# Patient Record
Sex: Female | Born: 1950 | Race: White | Hispanic: No | Marital: Single | Smoking: Former smoker
Health system: Southern US, Community
[De-identification: ages and names within clinical notes are randomized; demographics above are authoritative.]

## PROBLEM LIST (undated history)

## (undated) DIAGNOSIS — M1990 Unspecified osteoarthritis, unspecified site: Secondary | ICD-10-CM

## (undated) DIAGNOSIS — D126 Benign neoplasm of colon, unspecified: Secondary | ICD-10-CM

## (undated) DIAGNOSIS — E119 Type 2 diabetes mellitus without complications: Secondary | ICD-10-CM

## (undated) DIAGNOSIS — I739 Peripheral vascular disease, unspecified: Secondary | ICD-10-CM

## (undated) DIAGNOSIS — C801 Malignant (primary) neoplasm, unspecified: Secondary | ICD-10-CM

## (undated) DIAGNOSIS — J449 Chronic obstructive pulmonary disease, unspecified: Secondary | ICD-10-CM

## (undated) DIAGNOSIS — I10 Essential (primary) hypertension: Secondary | ICD-10-CM

## (undated) DIAGNOSIS — F329 Major depressive disorder, single episode, unspecified: Secondary | ICD-10-CM

## (undated) DIAGNOSIS — F32A Depression, unspecified: Secondary | ICD-10-CM

## (undated) DIAGNOSIS — J45909 Unspecified asthma, uncomplicated: Secondary | ICD-10-CM

## (undated) DIAGNOSIS — I350 Nonrheumatic aortic (valve) stenosis: Secondary | ICD-10-CM

## (undated) DIAGNOSIS — R1319 Other dysphagia: Secondary | ICD-10-CM

## (undated) DIAGNOSIS — K219 Gastro-esophageal reflux disease without esophagitis: Secondary | ICD-10-CM

## (undated) DIAGNOSIS — E669 Obesity, unspecified: Secondary | ICD-10-CM

## (undated) DIAGNOSIS — E785 Hyperlipidemia, unspecified: Secondary | ICD-10-CM

## (undated) HISTORY — PX: FRACTURE SURGERY: SHX138

## (undated) HISTORY — PX: BLADDER SURGERY: SHX569

## (undated) HISTORY — PX: FOOT SURGERY: SHX648

## (undated) MED FILL — Fosaprepitant Dimeglumine For IV Infusion 150 MG (Base Eq): INTRAVENOUS | Qty: 5 | Status: AC

---

## 2011-06-06 DIAGNOSIS — E118 Type 2 diabetes mellitus with unspecified complications: Secondary | ICD-10-CM | POA: Insufficient documentation

## 2011-06-06 DIAGNOSIS — E1169 Type 2 diabetes mellitus with other specified complication: Secondary | ICD-10-CM | POA: Insufficient documentation

## 2011-06-06 DIAGNOSIS — E119 Type 2 diabetes mellitus without complications: Secondary | ICD-10-CM | POA: Insufficient documentation

## 2011-06-06 DIAGNOSIS — I152 Hypertension secondary to endocrine disorders: Secondary | ICD-10-CM | POA: Insufficient documentation

## 2011-06-06 DIAGNOSIS — F329 Major depressive disorder, single episode, unspecified: Secondary | ICD-10-CM | POA: Insufficient documentation

## 2011-06-06 DIAGNOSIS — F39 Unspecified mood [affective] disorder: Secondary | ICD-10-CM | POA: Insufficient documentation

## 2012-03-12 DIAGNOSIS — Z720 Tobacco use: Secondary | ICD-10-CM | POA: Insufficient documentation

## 2012-03-12 DIAGNOSIS — I739 Peripheral vascular disease, unspecified: Secondary | ICD-10-CM | POA: Insufficient documentation

## 2012-03-12 DIAGNOSIS — I70219 Atherosclerosis of native arteries of extremities with intermittent claudication, unspecified extremity: Secondary | ICD-10-CM | POA: Insufficient documentation

## 2012-07-31 DIAGNOSIS — J4489 Other specified chronic obstructive pulmonary disease: Secondary | ICD-10-CM | POA: Insufficient documentation

## 2012-07-31 DIAGNOSIS — J449 Chronic obstructive pulmonary disease, unspecified: Secondary | ICD-10-CM | POA: Insufficient documentation

## 2013-08-19 DIAGNOSIS — IMO0001 Reserved for inherently not codable concepts without codable children: Secondary | ICD-10-CM | POA: Insufficient documentation

## 2015-04-05 DIAGNOSIS — Z794 Long term (current) use of insulin: Secondary | ICD-10-CM | POA: Insufficient documentation

## 2015-04-05 DIAGNOSIS — M159 Polyosteoarthritis, unspecified: Secondary | ICD-10-CM | POA: Insufficient documentation

## 2015-04-05 DIAGNOSIS — I10 Essential (primary) hypertension: Secondary | ICD-10-CM | POA: Insufficient documentation

## 2015-04-05 DIAGNOSIS — E782 Mixed hyperlipidemia: Secondary | ICD-10-CM | POA: Insufficient documentation

## 2015-04-05 DIAGNOSIS — E118 Type 2 diabetes mellitus with unspecified complications: Secondary | ICD-10-CM | POA: Insufficient documentation

## 2015-04-05 DIAGNOSIS — E669 Obesity, unspecified: Secondary | ICD-10-CM | POA: Insufficient documentation

## 2015-05-04 DIAGNOSIS — F322 Major depressive disorder, single episode, severe without psychotic features: Secondary | ICD-10-CM | POA: Diagnosis not present

## 2015-05-12 DIAGNOSIS — Z1211 Encounter for screening for malignant neoplasm of colon: Secondary | ICD-10-CM | POA: Diagnosis not present

## 2015-05-18 DIAGNOSIS — F322 Major depressive disorder, single episode, severe without psychotic features: Secondary | ICD-10-CM | POA: Diagnosis not present

## 2015-05-31 DIAGNOSIS — F322 Major depressive disorder, single episode, severe without psychotic features: Secondary | ICD-10-CM | POA: Diagnosis not present

## 2015-06-06 DIAGNOSIS — F322 Major depressive disorder, single episode, severe without psychotic features: Secondary | ICD-10-CM | POA: Diagnosis not present

## 2015-06-10 DIAGNOSIS — F322 Major depressive disorder, single episode, severe without psychotic features: Secondary | ICD-10-CM | POA: Diagnosis not present

## 2015-06-11 DIAGNOSIS — F322 Major depressive disorder, single episode, severe without psychotic features: Secondary | ICD-10-CM | POA: Diagnosis not present

## 2015-06-15 DIAGNOSIS — F322 Major depressive disorder, single episode, severe without psychotic features: Secondary | ICD-10-CM | POA: Diagnosis not present

## 2015-06-20 DIAGNOSIS — F322 Major depressive disorder, single episode, severe without psychotic features: Secondary | ICD-10-CM | POA: Diagnosis not present

## 2015-06-27 DIAGNOSIS — H5213 Myopia, bilateral: Secondary | ICD-10-CM | POA: Diagnosis not present

## 2015-06-27 DIAGNOSIS — E119 Type 2 diabetes mellitus without complications: Secondary | ICD-10-CM | POA: Diagnosis not present

## 2015-06-27 DIAGNOSIS — H524 Presbyopia: Secondary | ICD-10-CM | POA: Diagnosis not present

## 2015-07-04 DIAGNOSIS — F322 Major depressive disorder, single episode, severe without psychotic features: Secondary | ICD-10-CM | POA: Diagnosis not present

## 2015-07-06 DIAGNOSIS — J45909 Unspecified asthma, uncomplicated: Secondary | ICD-10-CM | POA: Diagnosis not present

## 2015-07-06 DIAGNOSIS — J449 Chronic obstructive pulmonary disease, unspecified: Secondary | ICD-10-CM | POA: Diagnosis not present

## 2015-07-06 DIAGNOSIS — F3341 Major depressive disorder, recurrent, in partial remission: Secondary | ICD-10-CM | POA: Diagnosis not present

## 2015-07-06 DIAGNOSIS — Z794 Long term (current) use of insulin: Secondary | ICD-10-CM | POA: Diagnosis not present

## 2015-07-06 DIAGNOSIS — I739 Peripheral vascular disease, unspecified: Secondary | ICD-10-CM | POA: Diagnosis not present

## 2015-07-06 DIAGNOSIS — I10 Essential (primary) hypertension: Secondary | ICD-10-CM | POA: Diagnosis not present

## 2015-07-06 DIAGNOSIS — E782 Mixed hyperlipidemia: Secondary | ICD-10-CM | POA: Diagnosis not present

## 2015-07-06 DIAGNOSIS — E118 Type 2 diabetes mellitus with unspecified complications: Secondary | ICD-10-CM | POA: Diagnosis not present

## 2015-07-18 DIAGNOSIS — F322 Major depressive disorder, single episode, severe without psychotic features: Secondary | ICD-10-CM | POA: Diagnosis not present

## 2015-08-01 DIAGNOSIS — F322 Major depressive disorder, single episode, severe without psychotic features: Secondary | ICD-10-CM | POA: Diagnosis not present

## 2015-08-04 ENCOUNTER — Encounter: Payer: Self-pay | Admitting: *Deleted

## 2015-08-05 ENCOUNTER — Inpatient Hospital Stay
Admission: AD | Admit: 2015-08-05 | Discharge: 2015-08-06 | DRG: 189 | Disposition: A | Discharge status: Home / Self Care | Payer: PPO | Source: Ambulatory Visit | Attending: Internal Medicine | Admitting: Internal Medicine

## 2015-08-05 ENCOUNTER — Encounter: Payer: Self-pay | Admitting: *Deleted

## 2015-08-05 ENCOUNTER — Ambulatory Visit: Payer: PPO

## 2015-08-05 ENCOUNTER — Ambulatory Visit: Payer: PPO | Admitting: Anesthesiology

## 2015-08-05 ENCOUNTER — Encounter
Admission: AD | Disposition: A | Discharge status: Home / Self Care | Payer: Self-pay | Source: Ambulatory Visit | Attending: Internal Medicine

## 2015-08-05 DIAGNOSIS — E785 Hyperlipidemia, unspecified: Secondary | ICD-10-CM | POA: Diagnosis present

## 2015-08-05 DIAGNOSIS — D123 Benign neoplasm of transverse colon: Secondary | ICD-10-CM | POA: Diagnosis not present

## 2015-08-05 DIAGNOSIS — R0602 Shortness of breath: Secondary | ICD-10-CM

## 2015-08-05 DIAGNOSIS — Z79899 Other long term (current) drug therapy: Secondary | ICD-10-CM | POA: Diagnosis not present

## 2015-08-05 DIAGNOSIS — Z8249 Family history of ischemic heart disease and other diseases of the circulatory system: Secondary | ICD-10-CM | POA: Diagnosis not present

## 2015-08-05 DIAGNOSIS — Z794 Long term (current) use of insulin: Secondary | ICD-10-CM | POA: Diagnosis not present

## 2015-08-05 DIAGNOSIS — Z823 Family history of stroke: Secondary | ICD-10-CM | POA: Diagnosis not present

## 2015-08-05 DIAGNOSIS — Z6841 Body Mass Index (BMI) 40.0 and over, adult: Secondary | ICD-10-CM

## 2015-08-05 DIAGNOSIS — E119 Type 2 diabetes mellitus without complications: Secondary | ICD-10-CM | POA: Diagnosis not present

## 2015-08-05 DIAGNOSIS — Z7982 Long term (current) use of aspirin: Secondary | ICD-10-CM

## 2015-08-05 DIAGNOSIS — K645 Perianal venous thrombosis: Secondary | ICD-10-CM | POA: Diagnosis not present

## 2015-08-05 DIAGNOSIS — Z87891 Personal history of nicotine dependence: Secondary | ICD-10-CM | POA: Diagnosis not present

## 2015-08-05 DIAGNOSIS — I10 Essential (primary) hypertension: Secondary | ICD-10-CM | POA: Diagnosis present

## 2015-08-05 DIAGNOSIS — F329 Major depressive disorder, single episode, unspecified: Secondary | ICD-10-CM | POA: Diagnosis not present

## 2015-08-05 DIAGNOSIS — K5730 Diverticulosis of large intestine without perforation or abscess without bleeding: Secondary | ICD-10-CM | POA: Diagnosis not present

## 2015-08-05 DIAGNOSIS — E669 Obesity, unspecified: Secondary | ICD-10-CM | POA: Diagnosis present

## 2015-08-05 DIAGNOSIS — J9601 Acute respiratory failure with hypoxia: Secondary | ICD-10-CM | POA: Diagnosis not present

## 2015-08-05 DIAGNOSIS — Z9889 Other specified postprocedural states: Secondary | ICD-10-CM | POA: Diagnosis not present

## 2015-08-05 DIAGNOSIS — J441 Chronic obstructive pulmonary disease with (acute) exacerbation: Secondary | ICD-10-CM | POA: Diagnosis present

## 2015-08-05 DIAGNOSIS — F322 Major depressive disorder, single episode, severe without psychotic features: Secondary | ICD-10-CM | POA: Diagnosis not present

## 2015-08-05 DIAGNOSIS — K640 First degree hemorrhoids: Secondary | ICD-10-CM | POA: Diagnosis not present

## 2015-08-05 DIAGNOSIS — K648 Other hemorrhoids: Secondary | ICD-10-CM | POA: Diagnosis not present

## 2015-08-05 DIAGNOSIS — Z1211 Encounter for screening for malignant neoplasm of colon: Secondary | ICD-10-CM | POA: Diagnosis not present

## 2015-08-05 DIAGNOSIS — Z833 Family history of diabetes mellitus: Secondary | ICD-10-CM

## 2015-08-05 DIAGNOSIS — R0902 Hypoxemia: Secondary | ICD-10-CM | POA: Diagnosis not present

## 2015-08-05 DIAGNOSIS — K635 Polyp of colon: Secondary | ICD-10-CM | POA: Diagnosis not present

## 2015-08-05 HISTORY — DX: Type 2 diabetes mellitus without complications: E11.9

## 2015-08-05 HISTORY — DX: Essential (primary) hypertension: I10

## 2015-08-05 HISTORY — DX: Chronic obstructive pulmonary disease, unspecified: J44.9

## 2015-08-05 HISTORY — DX: Hyperlipidemia, unspecified: E78.5

## 2015-08-05 HISTORY — DX: Obesity, unspecified: E66.9

## 2015-08-05 HISTORY — DX: Depression, unspecified: F32.A

## 2015-08-05 HISTORY — PX: COLONOSCOPY WITH PROPOFOL: SHX5780

## 2015-08-05 HISTORY — DX: Major depressive disorder, single episode, unspecified: F32.9

## 2015-08-05 LAB — GLUCOSE, CAPILLARY
GLUCOSE-CAPILLARY: 108 mg/dL — AB (ref 65–99)
GLUCOSE-CAPILLARY: 155 mg/dL — AB (ref 65–99)
GLUCOSE-CAPILLARY: 246 mg/dL — AB (ref 65–99)
Glucose-Capillary: 290 mg/dL — ABNORMAL HIGH (ref 65–99)

## 2015-08-05 SURGERY — COLONOSCOPY WITH PROPOFOL
Anesthesia: General

## 2015-08-05 MED ORDER — IPRATROPIUM-ALBUTEROL 0.5-2.5 (3) MG/3ML IN SOLN
RESPIRATORY_TRACT | Status: AC
  Administered 2015-08-05: 3 mL via RESPIRATORY_TRACT
  Filled 2015-08-05: qty 3

## 2015-08-05 MED ORDER — MONTELUKAST SODIUM 10 MG PO TABS
10.0000 mg | ORAL_TABLET | Freq: Every day | ORAL | Status: DC
  Administered 2015-08-05: 10 mg via ORAL
  Filled 2015-08-05: qty 1

## 2015-08-05 MED ORDER — HYDROCHLOROTHIAZIDE 25 MG PO TABS
25.0000 mg | ORAL_TABLET | Freq: Every day | ORAL | Status: DC
  Administered 2015-08-06: 25 mg via ORAL
  Filled 2015-08-05: qty 1

## 2015-08-05 MED ORDER — METHYLPREDNISOLONE SODIUM SUCC 125 MG IJ SOLR
60.0000 mg | Freq: Two times a day (BID) | INTRAMUSCULAR | Status: DC
  Administered 2015-08-05 – 2015-08-06 (×2): 60 mg via INTRAVENOUS
  Filled 2015-08-05 (×2): qty 2

## 2015-08-05 MED ORDER — FUROSEMIDE 10 MG/ML IJ SOLN
INTRAMUSCULAR | Status: DC | PRN
  Administered 2015-08-05: 10 mg via INTRAMUSCULAR

## 2015-08-05 MED ORDER — FLUMAZENIL 0.5 MG/5ML IV SOLN
INTRAVENOUS | Status: AC
  Filled 2015-08-05: qty 5

## 2015-08-05 MED ORDER — INSULIN ASPART 100 UNIT/ML ~~LOC~~ SOLN
0.0000 [IU] | Freq: Every day | SUBCUTANEOUS | Status: DC
  Administered 2015-08-05: 3 [IU] via SUBCUTANEOUS
  Filled 2015-08-05: qty 3

## 2015-08-05 MED ORDER — LOSARTAN POTASSIUM 50 MG PO TABS
100.0000 mg | ORAL_TABLET | Freq: Every day | ORAL | Status: DC
  Administered 2015-08-06: 100 mg via ORAL
  Filled 2015-08-05: qty 2

## 2015-08-05 MED ORDER — AMLODIPINE BESYLATE 10 MG PO TABS
10.0000 mg | ORAL_TABLET | Freq: Every day | ORAL | Status: DC
Start: 2015-08-06 — End: 2015-08-06
  Administered 2015-08-06: 10 mg via ORAL
  Filled 2015-08-05: qty 1

## 2015-08-05 MED ORDER — FUROSEMIDE 10 MG/ML IJ SOLN
INTRAMUSCULAR | Status: AC
  Filled 2015-08-05: qty 4

## 2015-08-05 MED ORDER — IPRATROPIUM-ALBUTEROL 0.5-2.5 (3) MG/3ML IN SOLN: 3.0000 mL | Freq: Once | RESPIRATORY_TRACT | Status: DC

## 2015-08-05 MED ORDER — METFORMIN HCL 500 MG PO TABS
1000.0000 mg | ORAL_TABLET | Freq: Two times a day (BID) | ORAL | Status: DC
  Administered 2015-08-05 – 2015-08-06 (×2): 1000 mg via ORAL
  Filled 2015-08-05 (×2): qty 2

## 2015-08-05 MED ORDER — ATENOLOL 25 MG PO TABS
25.0000 mg | ORAL_TABLET | Freq: Every day | ORAL | Status: DC
  Filled 2015-08-05: qty 1

## 2015-08-05 MED ORDER — BUPROPION HCL ER (XL) 150 MG PO TB24
300.0000 mg | ORAL_TABLET | Freq: Every day | ORAL | Status: DC
  Administered 2015-08-06: 300 mg via ORAL
  Filled 2015-08-05: qty 2

## 2015-08-05 MED ORDER — INSULIN GLARGINE 100 UNIT/ML ~~LOC~~ SOLN
25.0000 [IU] | Freq: Two times a day (BID) | SUBCUTANEOUS | Status: DC
Start: 2015-08-05 — End: 2015-08-06
  Administered 2015-08-05 – 2015-08-06 (×2): 25 [IU] via SUBCUTANEOUS
  Filled 2015-08-05 (×3): qty 0.25

## 2015-08-05 MED ORDER — PROPOFOL 10 MG/ML IV BOLUS
INTRAVENOUS | Status: DC | PRN
  Administered 2015-08-05: 70 mg via INTRAVENOUS

## 2015-08-05 MED ORDER — SODIUM CHLORIDE 0.9 % IV SOLN
INTRAVENOUS | Status: DC
  Administered 2015-08-05 (×2): via INTRAVENOUS

## 2015-08-05 MED ORDER — BUDESONIDE 0.25 MG/2ML IN SUSP
0.2500 mg | Freq: Two times a day (BID) | RESPIRATORY_TRACT | Status: DC
  Administered 2015-08-05 – 2015-08-06 (×2): 0.25 mg via RESPIRATORY_TRACT
  Filled 2015-08-05 (×5): qty 2

## 2015-08-05 MED ORDER — AZITHROMYCIN 250 MG PO TABS
500.0000 mg | ORAL_TABLET | Freq: Every day | ORAL | Status: AC
  Administered 2015-08-05: 500 mg via ORAL
  Filled 2015-08-05: qty 2

## 2015-08-05 MED ORDER — ATORVASTATIN CALCIUM 20 MG PO TABS
80.0000 mg | ORAL_TABLET | Freq: Every day | ORAL | Status: DC
  Administered 2015-08-06: 80 mg via ORAL
  Filled 2015-08-05: qty 4

## 2015-08-05 MED ORDER — LOSARTAN POTASSIUM-HCTZ 100-25 MG PO TABS: 1.0000 | ORAL_TABLET | Freq: Every day | ORAL | Status: DC

## 2015-08-05 MED ORDER — IPRATROPIUM-ALBUTEROL 0.5-2.5 (3) MG/3ML IN SOLN
3.0000 mL | Freq: Four times a day (QID) | RESPIRATORY_TRACT | Status: DC
  Administered 2015-08-05 – 2015-08-06 (×4): 3 mL via RESPIRATORY_TRACT
  Filled 2015-08-05 (×4): qty 3

## 2015-08-05 MED ORDER — IPRATROPIUM-ALBUTEROL 0.5-2.5 (3) MG/3ML IN SOLN
3.0000 mL | Freq: Four times a day (QID) | RESPIRATORY_TRACT | Status: DC
  Administered 2015-08-05: 3 mL via RESPIRATORY_TRACT

## 2015-08-05 MED ORDER — ATENOLOL 25 MG PO TABS
ORAL_TABLET | ORAL | Status: AC
  Administered 2015-08-05: 10:00:00
  Filled 2015-08-05: qty 1

## 2015-08-05 MED ORDER — METHYLPREDNISOLONE SODIUM SUCC 125 MG IJ SOLR: 60.0000 mg | Freq: Two times a day (BID) | INTRAMUSCULAR | Status: DC

## 2015-08-05 MED ORDER — PROPOFOL 500 MG/50ML IV EMUL
INTRAVENOUS | Status: DC | PRN
  Administered 2015-08-05: 140 ug/kg/min via INTRAVENOUS

## 2015-08-05 MED ORDER — FUROSEMIDE 10 MG/ML IJ SOLN
10.0000 mg | Freq: Once | INTRAMUSCULAR | Status: AC
  Administered 2015-08-05: 10 mg via INTRAVENOUS

## 2015-08-05 MED ORDER — ATENOLOL 25 MG PO TABS
25.0000 mg | ORAL_TABLET | Freq: Once | ORAL | Status: AC
  Administered 2015-08-05: 25 mg via ORAL

## 2015-08-05 MED ORDER — INSULIN ASPART 100 UNIT/ML ~~LOC~~ SOLN
0.0000 [IU] | Freq: Three times a day (TID) | SUBCUTANEOUS | Status: DC
  Administered 2015-08-05: 3 [IU] via SUBCUTANEOUS
  Administered 2015-08-06: 2 [IU] via SUBCUTANEOUS
  Filled 2015-08-05: qty 3
  Filled 2015-08-05: qty 2

## 2015-08-05 MED ORDER — ACETAMINOPHEN 325 MG PO TABS: 650.0000 mg | ORAL_TABLET | Freq: Four times a day (QID) | ORAL | Status: DC | PRN

## 2015-08-05 MED ORDER — AZITHROMYCIN 250 MG PO TABS
250.0000 mg | ORAL_TABLET | Freq: Every day | ORAL | Status: DC
  Administered 2015-08-06: 250 mg via ORAL
  Filled 2015-08-05: qty 1

## 2015-08-05 MED ORDER — INSULIN ASPART 100 UNIT/ML ~~LOC~~ SOLN: 0.0000 [IU] | Freq: Three times a day (TID) | SUBCUTANEOUS | Status: DC

## 2015-08-05 MED ORDER — ACETAMINOPHEN 650 MG RE SUPP
650.0000 mg | Freq: Four times a day (QID) | RECTAL | Status: DC | PRN
Start: 2015-08-05 — End: 2015-08-06

## 2015-08-05 MED ORDER — FUROSEMIDE 10 MG/ML IJ SOLN
INTRAMUSCULAR | Status: AC
  Administered 2015-08-05: 40 mg
  Filled 2015-08-05: qty 4

## 2015-08-05 NOTE — Evaluation (Signed)
Physical Therapy Evaluation Patient Details Name: Terri [name] MRN: [id number] DOB: 1950/08/18 Today's Date: 08/05/2015   History of Present Illness  Pt is a 65 y/o female here with respiratory failure.  She does not use O2 at home, but is currently on 3-3.5 liters her with sats in the mid 90s.    Clinical Impression  Pt is independent with mobility, ambulation, and does well with steps and balance acts.  She displays slow but consistent ambulation and good confidence with all acts. She did require 4 liters O2 during ambulation with sats dropping from mid to low 90s and minimal c/o fatigue.  Pt generally did well and should not require further PT intervention on D/C.  May need further assessment regarding O2, but is safe to return home from a rehab perspective.     Follow Up Recommendations No PT follow up (may need O2?)    Equipment Recommendations  None recommended by PT    Recommendations for Other Services       Precautions / Restrictions Restrictions Weight Bearing Restrictions: No      Mobility  Bed Mobility Overal bed mobility: Independent             General bed mobility comments: Pt able to sit up and don shoes at EOB w/o issue  Transfers Overall transfer level: Independent Equipment used: None             General transfer comment: Pt able to rise w/o assist, shows good balance and confidence  Ambulation/Gait Ambulation/Gait assistance: Supervision Ambulation Distance (Feet): 250 Feet Assistive device: None       General Gait Details: Pt with safe, consistent gait though she does have a consistent lateral lean with each step (she reports this to be baseline). Pt with no safety issues or LOBs and generally has only minimal c/o fatigue, we did use 4 liters O2 the whole time with slight drop in stats from high to low 90s.  Stairs Stairs: Yes Stairs assistance: Independent Stair Management: One rail Right Number of Stairs: 7 General stair comments: Pt  able to negotiate up/down steps with good safety/confidence.   Wheelchair Mobility    Modified Rankin (Stroke Patients Only)       Balance Overall balance assessment: Independent                                           Pertinent Vitals/Pain Pain Assessment: No/denies pain (minimal LE pain after prolonged ambulation)    Home Living Family/patient expects to be discharged to:: Private residence Living Arrangements: Alone     Home Access: Stairs to enter Entrance Stairs-Rails: Right Entrance Stairs-Number of Steps: 5          Prior Function Level of Independence: Independent         Comments: Pt takes city [place] and has a friend who takes her out occasionally.       Hand Dominance        Extremity/Trunk Assessment   Upper Extremity Assessment: Overall WFL for tasks assessed           Lower Extremity Assessment: Overall WFL for tasks assessed         Communication   Communication: No difficulties  Cognition Arousal/Alertness: Awake/alert Behavior During Therapy: WFL for tasks assessed/performed Overall Cognitive Status: Within Functional Limits for tasks assessed  General Comments      Exercises        Assessment/Plan    PT Assessment Patent does not need any further PT services  PT Diagnosis Difficulty walking;Generalized weakness   PT Problem List    PT Treatment Interventions     PT Goals (Current goals can be found in the Care Plan section) Acute Rehab PT Goals Patient Stated Goal: go home PT Goal Formulation: With patient Time For Goal Achievement: 08/19/15 Potential to Achieve Goals: Good    Frequency     Barriers to discharge        Co-evaluation               End of Session Equipment Utilized During Treatment: Oxygen (4 liters ) Activity Tolerance: Patient tolerated treatment well Patient left: with bed alarm set;with call bell/phone within reach            Time: [id number] PT Time Calculation (min) (ACUTE ONLY): 24 min   Charges:   PT Evaluation $PT Eval Low Complexity: 1 Procedure     PT G Codes:       Wayne Both, PT, DPT [phone]  Kreg Shropshire 08/05/2015, 5:27 PM

## 2015-08-05 NOTE — H&P (Signed)
[facility] [place] NAME: Terri [name]    MR#:  [id number]  DATE OF BIRTH:  1951-02-17  DATE OF ADMISSION:  08/05/2015  PRIMARY CARE PHYSICIAN: SPARKS,JEFFREY D, MD   REQUESTING/REFERRING PHYSICIAN: Dr Arther Dames  CHIEF COMPLAINT:  Shortness of breath and hypoxia in recovery area after colonoscopy  HISTORY OF PRESENT ILLNESS:  Terri [name]  is a 65 y.o. female with a known history of COPD. She had a routine colonoscopy with removal of one polyp today as per Dr. Rayann Heman gastroenterologist. Patient feels a little shortness of breath. Some cough. Some wheeze. The patient became hypoxic when she was lying down flat. Anesthesia ordered a chest x-ray which was negative and the patient received a dose of Lasix and a dose of atenolol. Hospitalist services were contacted for further evaluation when the patient was hypoxic. When I evaluated the patient, I turned off her oxygen and she desaturated down to 85% on room air. Patient states that she started smoking recently K she was nervous about the procedure.  PAST MEDICAL HISTORY:   Past Medical History  Diagnosis Date  . Hypertension   . Diabetes mellitus without complication ([place])   . COPD (chronic obstructive pulmonary disease) ([place])   . Depression   . Hyperlipemia   . Obesity     PAST SURGICAL HISTORY:   Past Surgical History  Procedure Laterality Date  . Bladder surgery    . Foot surgery Left     SOCIAL HISTORY:   Social History  Substance Use Topics  . Smoking status: Former Research scientist (life sciences)  . Smokeless tobacco: Never Used  . Alcohol Use: No    FAMILY HISTORY:   Family History  Problem Relation Age of Onset  . CVA Mother   . Diabetes Mother   . CAD Father     DRUG ALLERGIES:  No Known Allergies  REVIEW OF SYSTEMS:  CONSTITUTIONAL: No fever, fatigue or weakness.  EYES: No blurred or double vision.  EARS, NOSE, AND THROAT: No tinnitus or ear pain. No sore throat.  Decreased hearing RESPIRATORY: Positive for slight cough, positive for shortness of breath, positive for wheezing. No hemoptysis.  CARDIOVASCULAR: No chest pain, orthopnea, edema.  GASTROINTESTINAL: No nausea, vomiting, or abdominal pain. No blood in bowel movements. Positive for diarrhea with the colonoscopy prep GENITOURINARY: No dysuria, hematuria.  ENDOCRINE: No polyuria, nocturia,  HEMATOLOGY: No anemia, easy bruising or bleeding SKIN: No rash or lesion. MUSCULOSKELETAL: No joint pain or arthritis.   NEUROLOGIC: No tingling, numbness, weakness.  PSYCHIATRY: Positive for depression.   MEDICATIONS AT HOME:   Prior to Admission medications   Medication Sig Start Date End Date Taking? Authorizing Provider  albuterol (PROVENTIL HFA;VENTOLIN HFA) 108 (90 Base) MCG/ACT inhaler Inhale 2 puffs into the lungs every 6 (six) hours as needed for wheezing or shortness of breath.   Yes Historical Provider, MD  amLODipine (NORVASC) 10 MG tablet Take 10 mg by mouth daily.   Yes Historical Provider, MD  aspirin EC 81 MG tablet Take 81 mg by mouth daily.   Yes Historical Provider, MD  atenolol (TENORMIN) 25 MG tablet Take 25 mg by mouth daily.   Yes Historical Provider, MD  atorvastatin (LIPITOR) 80 MG tablet Take 80 mg by mouth daily.   Yes Historical Provider, MD  buPROPion (WELLBUTRIN XL) 300 MG 24 hr tablet Take 300 mg by mouth daily.   Yes Historical Provider, MD  insulin glargine (LANTUS) 100 UNIT/ML injection Inject 25 Units into  the skin 2 (two) times daily.   Yes Historical Provider, MD  insulin lispro (HUMALOG) 100 UNIT/ML injection Inject 13 Units into the skin 3 (three) times daily before meals.   Yes Historical Provider, MD  losartan-hydrochlorothiazide (HYZAAR) 100-25 MG tablet Take 1 tablet by mouth daily.   Yes Historical Provider, MD  metFORMIN (GLUCOPHAGE) 500 MG tablet Take 1,000 mg by mouth 2 (two) times daily with a meal.   Yes Historical Provider, MD  montelukast (SINGULAIR) 10 MG  tablet Take 10 mg by mouth at bedtime.   Yes Historical Provider, MD      VITAL SIGNS:  Blood pressure 98/86, pulse 70, temperature 97.3 F (36.3 C), temperature source Tympanic, resp. rate 24, height 5\' 2"  (1.575 m), weight 99.791 kg (220 lb), SpO2 92 %.  PHYSICAL EXAMINATION:  GENERAL:  65 y.o.-year-old patient lying in the bed with no acute distress. Audible wheeze heard. EYES: Pupils equal, round, reactive to light and accommodation. No scleral icterus. Extraocular muscles intact.  HEENT: Head atraumatic, normocephalic. Oropharynx and nasopharynx clear.  NECK:  Supple, no jugular venous distention. No thyroid enlargement, no tenderness.  LUNGS: Decreased breath sounds bilaterally, poor air entry, positive wheezing throughout entire lung field. No rales,rhonchi or crepitation. No use of accessory muscles of respiration.  CARDIOVASCULAR: S1, S2 normal. No murmurs, rubs, or gallops.  ABDOMEN: Soft, nontender, nondistended. Bowel sounds present. No organomegaly or mass.  EXTREMITIES: Trace edema, no cyanosis, or clubbing.  NEUROLOGIC: Cranial nerves II through XII are intact. Muscle strength 5/5 in all extremities. Sensation intact. Gait not checked.  PSYCHIATRIC: The patient is alert and oriented x 3.  SKIN: No rash, lesion, or ulcer.   LABORATORY PANEL:   Not Done  RADIOLOGY:  X-ray Chest Pa Or Ap  08/05/2015  CLINICAL DATA:  65 year old female with hypoxia following colonoscopy today. Initial encounter. EXAM: CHEST 1 VIEW COMPARISON:  [facility] chest radiograph report 04/05/2015 (no images available). FINDINGS: Portable AP upright view at 1326 hours. Cardiac size at the upper limits of normal to mildly increased. Other mediastinal contours are within normal limits. Visualized tracheal air column is within normal limits. Allowing for portable technique, the lungs are clear. No pneumothorax or pleural effusion identified. IMPRESSION: No acute cardiopulmonary abnormality.  Electronically Signed   By: Genevie Ann M.D.   On: 08/05/2015 13:49    IMPRESSION AND PLAN:   1. Acute respiratory failure with hypoxia. Pulse ox 85% on room air while sitting up off oxygen done by me. I will give oxygen supplementation. Check pulse ox on room air after ambulation tomorrow. 2. COPD exacerbation with recent smoking. Start DuoNeb nebulizer solution, budesonide nebulizers and try to get better air entry in the lungs. We'll give a course of oral Zithromax. 3. Type 2 diabetes mellitus. I will put on sliding scale and continue her Lantus insulin. Sugars will be high on steroids. 4. Essential hypertension. Blood pressure on the lower side right now after they gave Lasix. Hold blood pressure medications rested today and can restart tomorrow. 5. Depression. Continue psychiatric medications. 6. Hyperlipidemia unspecified continue atorvastatin.  All the records are reviewed and case discussed with ED provider. Management plans discussed with the patient, family and they are in agreement.  CODE STATUS: Full code  TOTAL TIME TAKING CARE OF THIS PATIENT: 50 minutes.    Loletha Grayer M.D on 08/05/2015 at 2:17 PM  Between 7am to 6pm - Pager - [phone]  After 6pm call admission pager [place] Office  [phone]  CC: Primary care physician; Idelle Crouch, MD

## 2015-08-05 NOTE — OR Nursing (Signed)
Patient recieved post procedure with labored breatheing.  duoneb ordered Stat and given with slight improvement per patient.

## 2015-08-05 NOTE — OR Nursing (Signed)
Patient sats dropping to 895.  Placed patient back on mask O2 with return of O2 saturation to 94%.

## 2015-08-05 NOTE — Progress Notes (Signed)
GI Note:  Had screening colonoscpy today.   Had rapid O2 desaturation during the procedure and required oral airway and ambubag.  Had wheezing, SOB, new O2 requirement after the procedure.   Recs: - appreciate hospitalist care - Dr Vira Agar will check on her tomorrow.

## 2015-08-05 NOTE — Anesthesia Preprocedure Evaluation (Addendum)
Anesthesia Evaluation  Patient identified by MRN, date of birth, ID band Patient awake    Reviewed: Allergy & Precautions, NPO status , Patient's Chart, lab work & pertinent test results, reviewed documented beta blocker date and time   Airway Mallampati: III  TM Distance: >3 FB     Dental  (+) Chipped   Pulmonary COPD, Current Smoker,           Cardiovascular hypertension, Pt. on medications and Pt. on home beta blockers      Neuro/Psych PSYCHIATRIC DISORDERS Depression    GI/Hepatic   Endo/Other  diabetes, Type 2Morbid obesity  Renal/GU      Musculoskeletal   Abdominal   Peds  Hematology   Anesthesia Other Findings   Reproductive/Obstetrics                            Anesthesia Physical Anesthesia Plan  ASA: III  Anesthesia Plan: General   Post-op Pain Management:    Induction: Intravenous  Airway Management Planned: Nasal Cannula  Additional Equipment:   Intra-op Plan:   Post-operative Plan:   Informed Consent: I have reviewed the patients History and Physical, chart, labs and discussed the procedure including the risks, benefits and alternatives for the proposed anesthesia with the patient or authorized representative who has indicated his/her understanding and acceptance.     Plan Discussed with: CRNA  Anesthesia Plan Comments:        Anesthesia Quick Evaluation

## 2015-08-05 NOTE — Op Note (Signed)
[facility] Gastroenterology Patient Name: Terri [name] Procedure Date: 08/05/2015 10:19 AM MRN: [id number] Account #: [id number] Date of Birth: 1951/04/14 Admit Type: Outpatient Age: 65 Room: [facility] ENDO ROOM 3 Gender: Female Note Status: Finalized Procedure:            Colonoscopy Indications:          Screening for colorectal malignant neoplasm, This is                        the patient's first colonoscopy Patient Profile:      This is a 65 year old female. Providers:            Gerrit Heck. Rayann Heman, MD Referring MD:         Leonie Douglas. Doy Hutching, MD (Referring MD) Medicines:            Propofol per Anesthesia Complications:        No immediate complications. Procedure:            Pre-Anesthesia Assessment:                       - Prior to the procedure, a History and Physical was                        performed, and patient medications, allergies and                        sensitivities were reviewed. The patient's tolerance of                        previous anesthesia was reviewed.                       After obtaining informed consent, the colonoscope was                        passed under direct vision. Throughout the procedure,                        the patient's blood pressure, pulse, and oxygen                        saturations were monitored continuously. The                        Colonoscope was introduced through the anus and                        advanced to the the cecum, identified by appendiceal                        orifice and ileocecal valve. The colonoscopy was                        performed without difficulty. The patient tolerated the                        procedure poorly due to the patient's respiratory                        instability (hypoxia). The quality of the bowel  preparation was excellent. Findings:      The perianal exam findings include non-thrombosed external hemorrhoids.      A 5 mm polyp was found in  the proximal transverse colon. The polyp was       sessile. The polyp was removed with a cold snare. Resection and       retrieval were complete.      Multiple small and large-mouthed diverticula were found in the sigmoid       colon.      Internal hemorrhoids were found during retroflexion. The hemorrhoids       were Grade I (internal hemorrhoids that do not prolapse).      The exam was otherwise without abnormality.      - Severe hypooxia, ,required oral airway and ambubag Impression:           - Non-thrombosed external hemorrhoids found on perianal                        exam.                       - One 5 mm polyp in the proximal transverse colon,                        removed with a cold snare. Resected and retrieved.                       - Diverticulosis in the sigmoid colon.                       - Internal hemorrhoids.                       - The examination was otherwise normal.                       - Severe hypooxia, ,required oral airway and ambubag Recommendation:       - Observe patient in GI recovery unit.                       - High fiber diet.                       - Continue present medications.                       - Await pathology results.                       - Repeat colonoscopy for surveillance based on                        pathology results.                       - Obtain sleep apnea study                       - Return to referring physician.                       - The findings and recommendations were discussed with  the patient.                       - The findings and recommendations were discussed with                        the patient's family. Procedure Code(s):    --- Professional ---                       [phone], Colonoscopy, flexible; with removal of tumor(s),                        polyp(s), or other lesion(s) by snare technique Diagnosis Code(s):    --- Professional ---                       Z12.11, Encounter for  screening for malignant neoplasm                        of colon                       D12.3, Benign neoplasm of transverse colon (hepatic                        flexure or splenic flexure)                       K64.0, First degree hemorrhoids                       K64.4, Residual hemorrhoidal skin tags                       K57.30, Diverticulosis of large intestine without                        perforation or abscess without bleeding CPT copyright 2016 American Medical Association. All rights reserved. The codes documented in this report are preliminary and upon coder review may  be revised to meet current compliance requirements. Mellody Life, MD 08/05/2015 10:53:52 AM This report has been signed electronically. Number of Addenda: 0 Note Initiated On: 08/05/2015 10:19 AM Scope Withdrawal Time: 0 hours 18 minutes 56 seconds  Total Procedure Duration: 0 hours 25 minutes 52 seconds       [facility]

## 2015-08-05 NOTE — Transfer of Care (Signed)
Immediate Anesthesia Transfer of Care Note  Patient: Terri [name]  Procedure(s) Performed: Procedure(s): COLONOSCOPY WITH PROPOFOL (N/A)  Patient Location: PACU  Anesthesia Type:General  Level of Consciousness: awake, alert  and oriented  Airway & Oxygen Therapy: Patient Spontanous Breathing and Patient connected to face mask oxygen  Post-op Assessment: Report given to RN and Post -op Vital signs reviewed and unstable, Anesthesiologist notified  Post vital signs: Reviewed and stable  Last Vitals:  Filed Vitals:   08/05/15 0952  BP: 159/65  Pulse: 73  Temp: 36.3 C  Resp: 16    Complications: respiratory complications

## 2015-08-05 NOTE — OR Nursing (Signed)
Patient breath sounds very diminished with bilateral faint wheezes heard .  Patient still with labored breatheing at oxygen mask. Dr. Marcello Moores called to reevaluate patient.

## 2015-08-05 NOTE — H&P (Signed)
Primary Care Physician:  Idelle Crouch, MD  Pre-Procedure History & Physical: HPI:  Terri [name] is a 65 y.o. female is here for an colonoscopy.   Past Medical History  Diagnosis Date  . Hypertension   . Diabetes mellitus without complication ([place])   . COPD (chronic obstructive pulmonary disease) ([place])   . Depression   . Hyperlipemia   . Obesity     Past Surgical History  Procedure Laterality Date  . Bladder surgery    . Foot surgery Left     Prior to Admission medications   Medication Sig Start Date End Date Taking? Authorizing Provider  albuterol (PROVENTIL HFA;VENTOLIN HFA) 108 (90 Base) MCG/ACT inhaler Inhale 2 puffs into the lungs every 6 (six) hours as needed for wheezing or shortness of breath.   Yes Historical Provider, MD  amLODipine (NORVASC) 10 MG tablet Take 10 mg by mouth daily.   Yes Historical Provider, MD  aspirin EC 81 MG tablet Take 81 mg by mouth daily.   Yes Historical Provider, MD  atenolol (TENORMIN) 25 MG tablet Take 25 mg by mouth daily.   Yes Historical Provider, MD  atorvastatin (LIPITOR) 80 MG tablet Take 80 mg by mouth daily.   Yes Historical Provider, MD  buPROPion (WELLBUTRIN XL) 300 MG 24 hr tablet Take 300 mg by mouth daily.   Yes Historical Provider, MD  insulin glargine (LANTUS) 100 UNIT/ML injection Inject 25 Units into the skin 2 (two) times daily.   Yes Historical Provider, MD  insulin lispro (HUMALOG) 100 UNIT/ML injection Inject 13 Units into the skin 3 (three) times daily before meals.   Yes Historical Provider, MD  losartan-hydrochlorothiazide (HYZAAR) 100-25 MG tablet Take 1 tablet by mouth daily.   Yes Historical Provider, MD  metFORMIN (GLUCOPHAGE) 500 MG tablet Take 1,000 mg by mouth 2 (two) times daily with a meal.   Yes Historical Provider, MD  montelukast (SINGULAIR) 10 MG tablet Take 10 mg by mouth at bedtime.   Yes Historical Provider, MD    Allergies as of 05/23/2015  . (Not on File)    History reviewed. No pertinent  family history.  Social History   Social History  . Marital Status: Single    Spouse Name: N/A  . Number of Children: N/A  . Years of Education: N/A   Occupational History  . Not on file.   Social History Main Topics  . Smoking status: Current Some Day Smoker  . Smokeless tobacco: Never Used  . Alcohol Use: No  . Drug Use: No  . Sexual Activity: Not on file   Other Topics Concern  . Not on file   Social History Narrative     Physical Exam: BP 159/65 mmHg  Pulse 73  Temp(Src) 97.3 F (36.3 C) (Tympanic)  Resp 16  Ht 5\' 2"  (1.575 m)  Wt 99.791 kg (220 lb)  BMI 40.23 kg/m2  SpO2 97% General:   Alert,  pleasant and cooperative in NAD Head:  Normocephalic and atraumatic. Neck:  Supple; no masses or thyromegaly. Lungs:  Clear throughout to auscultation.    Heart:  Regular rate and rhythm. Abdomen:  Soft, nontender and nondistended. Normal bowel sounds, without guarding, and without rebound.   Neurologic:  Alert and  oriented x4;  grossly normal neurologically.  Impression/Plan: [name] is here for an colonoscopy to be performed for screening  Risks, benefits, limitations, and alternatives regarding  colonoscopy have been reviewed with the patient.  Questions have been answered.  All parties agreeable.  Josefine Class, MD  08/05/2015, 10:19 AM

## 2015-08-06 DIAGNOSIS — F322 Major depressive disorder, single episode, severe without psychotic features: Secondary | ICD-10-CM | POA: Diagnosis not present

## 2015-08-06 DIAGNOSIS — J441 Chronic obstructive pulmonary disease with (acute) exacerbation: Secondary | ICD-10-CM | POA: Diagnosis not present

## 2015-08-06 DIAGNOSIS — J9601 Acute respiratory failure with hypoxia: Secondary | ICD-10-CM | POA: Diagnosis not present

## 2015-08-06 DIAGNOSIS — E119 Type 2 diabetes mellitus without complications: Secondary | ICD-10-CM | POA: Diagnosis not present

## 2015-08-06 DIAGNOSIS — Z720 Tobacco use: Secondary | ICD-10-CM | POA: Diagnosis not present

## 2015-08-06 LAB — BASIC METABOLIC PANEL
Anion gap: 9 (ref 5–15)
BUN: 20 mg/dL (ref 6–20)
CHLORIDE: 97 mmol/L — AB (ref 101–111)
CO2: 25 mmol/L (ref 22–32)
CREATININE: 0.98 mg/dL (ref 0.44–1.00)
Calcium: 8 mg/dL — ABNORMAL LOW (ref 8.9–10.3)
GFR, EST NON AFRICAN AMERICAN: 59 mL/min — AB (ref >60)
Glucose, Bld: 208 mg/dL — ABNORMAL HIGH (ref 65–99)
POTASSIUM: 3.3 mmol/L — AB (ref 3.5–5.1)
SODIUM: 131 mmol/L — AB (ref 135–145)

## 2015-08-06 LAB — CBC
HCT: 38.4 % (ref 35.0–47.0)
Hemoglobin: 12.9 g/dL (ref 12.0–16.0)
MCH: 26.5 pg (ref 26.0–34.0)
MCHC: 33.6 g/dL (ref 32.0–36.0)
MCV: 78.9 fL — AB (ref 80.0–100.0)
PLATELETS: 217 10*3/uL (ref 150–440)
RBC: 4.87 MIL/uL (ref 3.80–5.20)
RDW: 15.2 % — AB (ref 11.5–14.5)
WBC: 18.9 10*3/uL — AB (ref 3.6–11.0)

## 2015-08-06 LAB — GLUCOSE, CAPILLARY: Glucose-Capillary: 198 mg/dL — ABNORMAL HIGH (ref 65–99)

## 2015-08-06 MED ORDER — PREDNISONE 10 MG (21) PO TBPK: 10.0000 mg | ORAL_TABLET | Freq: Every day | ORAL | Status: DC

## 2015-08-06 MED ORDER — AZITHROMYCIN 250 MG PO TABS: ORAL_TABLET | ORAL | Status: DC

## 2015-08-06 NOTE — Progress Notes (Signed)
Pt to be discharged per MD order. IV removed. Instructions reviewed with pt and questions were answered. Scripts given to pt. Will be taken out in wheelchair.

## 2015-08-06 NOTE — Consult Note (Signed)
Patient breathing much better with good air flow on my exam today and no SOB with routine conversation.  She intends to smoke one cigarette a day until her current pack is used up and stop smoking again, which she did for 4 months but started again due to anxiety about her colonoscopy which was done yesterday.  Agree with discharge and follow up as needed.

## 2015-08-06 NOTE — Discharge Instructions (Signed)

## 2015-08-06 NOTE — Discharge Summary (Signed)
Terri [name], 65 y.o., DOB 06-Aug-1950, MRN [id number]. Admission date: 08/05/2015 Discharge Date 08/06/2015 Primary MD Idelle Crouch, MD Admitting Physician Loletha Grayer, MD  Admission Diagnosis  SCREEN  Discharge Diagnosis   Active Problems:   Acute respiratory failure with hypoxia [facility])  hypertension Diabetes type 2 COPD with acute exasperation Depression Hyperlipidemia Obesity        Hospital Course Terri [name] is a 65 y.o. female with a known history of COPD. She had a routine colonoscopy with removal of one polyp  as per Dr. Rayann Heman gastroenterologist.  The patient became hypoxic when she was lying down flat. Anesthesia ordered a chest x-ray which was negative and the patient received a dose of Lasix and a dose of atenolol. Hospitalist services were contacted for further evaluation when the patient was hypoxic patient was treated with nebulizers and steroids with significant improvement in her symptoms. She is feeling better and is at [facility]  Significant Tests:  See full reports for all details      X-ray Chest Pa Or Ap  08/05/2015  CLINICAL DATA:  65 year old female with hypoxia following colonoscopy today. Initial encounter. EXAM: CHEST 1 VIEW COMPARISON:  [facility] chest radiograph report 04/05/2015 (no images available). FINDINGS: Portable AP upright view at 1326 hours. Cardiac size at the upper limits of normal to mildly increased. Other mediastinal contours are within normal limits. Visualized tracheal air column is within normal limits. Allowing for portable technique, the lungs are clear. No pneumothorax or pleural effusion identified. IMPRESSION: No acute cardiopulmonary abnormality. Electronically Signed   By: Genevie Ann M.D.   On: 08/05/2015 13:49       Today   Subjective:   Terri [name] feels well on room air was to go home  Objective:   Blood pressure 151/61, pulse 88, temperature 97.8 F (36.6 C), temperature  source Oral, resp. rate 16, height 5\' 2"  (1.575 m), weight 99.791 kg (220 lb), SpO2 91 %.  .  Intake/Output Summary (Last 24 hours) at 08/06/15 1321 Last data filed at 08/06/15 0957  Gross per 24 hour  Intake    360 ml  Output      0 ml  Net    360 ml    Exam VITAL SIGNS: Blood pressure 151/61, pulse 88, temperature 97.8 F (36.6 C), temperature source Oral, resp. rate 16, height 5\' 2"  (1.575 m), weight 99.791 kg (220 lb), SpO2 91 %.  GENERAL:  65 y.o.-year-old patient lying in the bed with no acute distress.  EYES: Pupils equal, round, reactive to light and accommodation. No scleral icterus. Extraocular muscles intact.  HEENT: Head atraumatic, normocephalic. Oropharynx and nasopharynx clear.  NECK:  Supple, no jugular venous distention. No thyroid enlargement, no tenderness.  LUNGS: Normal breath sounds bilaterally, no wheezing, rales,rhonchi or crepitation. No use of accessory muscles of respiration.  CARDIOVASCULAR: S1, S2 normal. No murmurs, rubs, or gallops.  ABDOMEN: Soft, nontender, nondistended. Bowel sounds present. No organomegaly or mass.  EXTREMITIES: No pedal edema, cyanosis, or clubbing.  NEUROLOGIC: Cranial nerves II through XII are intact. Muscle strength 5/5 in all extremities. Sensation intact. Gait not checked.  PSYCHIATRIC: The patient is alert and oriented x 3.  SKIN: No obvious rash, lesion, or ulcer.   Data Review     CBC w Diff: Lab Results  Component Value Date   WBC 18.9* 08/06/2015   HGB 12.9 08/06/2015   HCT 38.4 08/06/2015  PLT 217 08/06/2015   CMP: Lab Results  Component Value Date   NA 131* 08/06/2015   K 3.3* 08/06/2015   CL 97* 08/06/2015   CO2 25 08/06/2015   BUN 20 08/06/2015   CREATININE 0.98 08/06/2015  .  Micro Results No results found for this or any previous visit (from the past 240 hour(s)).      Code Status Orders        Start     Ordered   08/05/15 1413  Full code   Continuous     08/05/15 1412    Code Status  History    Date Active Date Inactive Code Status Order ID Comments User Context   This patient has a current code status but no historical code status.          Follow-up Information    Follow up with SPARKS,JEFFREY D, MD In 7 days.   Specialty:  Internal Medicine   Contact information:   [place] [address] [phone]       Discharge Medications     Medication List    TAKE these medications        albuterol 108 (90 Base) MCG/ACT inhaler  Commonly known as:  PROVENTIL HFA;VENTOLIN HFA  Inhale 2 puffs into the lungs every 6 (six) hours as needed for wheezing or shortness of breath.     amLODipine 10 MG tablet  Commonly known as:  NORVASC  Take 10 mg by mouth daily.     aspirin EC 81 MG tablet  Take 81 mg by mouth daily.     atenolol 25 MG tablet  Commonly known as:  TENORMIN  Take 25 mg by mouth daily.     atorvastatin 80 MG tablet  Commonly known as:  LIPITOR  Take 80 mg by mouth daily.     azithromycin 250 MG tablet  Commonly known as:  ZITHROMAX  One tab daily x 4 days     buPROPion 300 MG 24 hr tablet  Commonly known as:  WELLBUTRIN XL  Take 300 mg by mouth daily.     insulin glargine 100 UNIT/ML injection  Commonly known as:  LANTUS  Inject 25 Units into the skin 2 (two) times daily.     insulin lispro 100 UNIT/ML injection  Commonly known as:  HUMALOG  Inject 13 Units into the skin 3 (three) times daily before meals.     losartan-hydrochlorothiazide 100-25 MG tablet  Commonly known as:  HYZAAR  Take 1 tablet by mouth daily.     metFORMIN 500 MG tablet  Commonly known as:  GLUCOPHAGE  Take 1,000 mg by mouth 2 (two) times daily with a meal.     montelukast 10 MG tablet  Commonly known as:  SINGULAIR  Take 10 mg by mouth at bedtime.     predniSONE 10 MG (21) Tbpk tablet  Commonly known as:  STERAPRED UNI-PAK 21 TAB  Take 1 tablet (10 mg total) by mouth daily.           Total Time in  preparing paper work, data evaluation and todays exam - 35 minutes  Dustin Flock M.D on 08/06/2015 at 1:21 PM  [facility]   Office  [phone]

## 2015-08-08 LAB — SURGICAL PATHOLOGY

## 2015-08-08 NOTE — Anesthesia Postprocedure Evaluation (Signed)
Anesthesia Post Note  Patient: Terri [name]  Procedure(s) Performed: Procedure(s) (LRB): COLONOSCOPY WITH PROPOFOL (N/A)  Patient location during evaluation: Endoscopy Anesthesia Type: General Level of consciousness: awake and alert Pain management: pain level controlled Vital Signs Assessment: post-procedure vital signs reviewed and stable Respiratory status: spontaneous breathing, nonlabored ventilation, respiratory function stable and patient connected to nasal cannula oxygen Cardiovascular status: blood pressure returned to baseline and stable Postop Assessment: no signs of nausea or vomiting Anesthetic complications: no    Last Vitals:  Filed Vitals:   08/06/15 0521 08/06/15 0848  BP: 128/49 151/61  Pulse: 72 88  Temp: 36.3 C 36.6 C  Resp: 18 16    Last Pain: There were no vitals filed for this visit.               Lodie Waheed S

## 2015-08-09 ENCOUNTER — Encounter: Payer: Self-pay | Admitting: Gastroenterology

## 2015-08-09 DIAGNOSIS — F322 Major depressive disorder, single episode, severe without psychotic features: Secondary | ICD-10-CM | POA: Diagnosis not present

## 2015-08-15 DIAGNOSIS — F322 Major depressive disorder, single episode, severe without psychotic features: Secondary | ICD-10-CM | POA: Diagnosis not present

## 2015-08-23 DIAGNOSIS — F322 Major depressive disorder, single episode, severe without psychotic features: Secondary | ICD-10-CM | POA: Diagnosis not present

## 2015-08-29 DIAGNOSIS — F322 Major depressive disorder, single episode, severe without psychotic features: Secondary | ICD-10-CM | POA: Diagnosis not present

## 2015-09-05 DIAGNOSIS — Z794 Long term (current) use of insulin: Secondary | ICD-10-CM | POA: Diagnosis not present

## 2015-09-05 DIAGNOSIS — J449 Chronic obstructive pulmonary disease, unspecified: Secondary | ICD-10-CM | POA: Diagnosis not present

## 2015-09-05 DIAGNOSIS — E118 Type 2 diabetes mellitus with unspecified complications: Secondary | ICD-10-CM | POA: Diagnosis not present

## 2015-09-05 DIAGNOSIS — J45909 Unspecified asthma, uncomplicated: Secondary | ICD-10-CM | POA: Diagnosis not present

## 2015-09-05 DIAGNOSIS — G4733 Obstructive sleep apnea (adult) (pediatric): Secondary | ICD-10-CM | POA: Diagnosis not present

## 2015-09-05 DIAGNOSIS — I10 Essential (primary) hypertension: Secondary | ICD-10-CM | POA: Diagnosis not present

## 2015-09-06 DIAGNOSIS — F322 Major depressive disorder, single episode, severe without psychotic features: Secondary | ICD-10-CM | POA: Diagnosis not present

## 2015-09-12 DIAGNOSIS — F322 Major depressive disorder, single episode, severe without psychotic features: Secondary | ICD-10-CM | POA: Diagnosis not present

## 2015-09-20 DIAGNOSIS — F322 Major depressive disorder, single episode, severe without psychotic features: Secondary | ICD-10-CM | POA: Diagnosis not present

## 2015-09-26 DIAGNOSIS — F322 Major depressive disorder, single episode, severe without psychotic features: Secondary | ICD-10-CM | POA: Diagnosis not present

## 2015-10-04 DIAGNOSIS — F322 Major depressive disorder, single episode, severe without psychotic features: Secondary | ICD-10-CM | POA: Diagnosis not present

## 2015-10-06 DIAGNOSIS — F3341 Major depressive disorder, recurrent, in partial remission: Secondary | ICD-10-CM | POA: Diagnosis not present

## 2015-10-06 DIAGNOSIS — Z1239 Encounter for other screening for malignant neoplasm of breast: Secondary | ICD-10-CM | POA: Diagnosis not present

## 2015-10-06 DIAGNOSIS — E782 Mixed hyperlipidemia: Secondary | ICD-10-CM | POA: Diagnosis not present

## 2015-10-06 DIAGNOSIS — J45909 Unspecified asthma, uncomplicated: Secondary | ICD-10-CM | POA: Diagnosis not present

## 2015-10-06 DIAGNOSIS — J449 Chronic obstructive pulmonary disease, unspecified: Secondary | ICD-10-CM | POA: Diagnosis not present

## 2015-10-06 DIAGNOSIS — I10 Essential (primary) hypertension: Secondary | ICD-10-CM | POA: Diagnosis not present

## 2015-10-06 DIAGNOSIS — E669 Obesity, unspecified: Secondary | ICD-10-CM | POA: Diagnosis not present

## 2015-10-06 DIAGNOSIS — E118 Type 2 diabetes mellitus with unspecified complications: Secondary | ICD-10-CM | POA: Diagnosis not present

## 2015-10-06 DIAGNOSIS — Z794 Long term (current) use of insulin: Secondary | ICD-10-CM | POA: Diagnosis not present

## 2015-10-06 DIAGNOSIS — Z79899 Other long term (current) drug therapy: Secondary | ICD-10-CM | POA: Diagnosis not present

## 2015-10-10 DIAGNOSIS — F322 Major depressive disorder, single episode, severe without psychotic features: Secondary | ICD-10-CM | POA: Diagnosis not present

## 2015-10-18 DIAGNOSIS — F322 Major depressive disorder, single episode, severe without psychotic features: Secondary | ICD-10-CM | POA: Diagnosis not present

## 2015-10-19 ENCOUNTER — Ambulatory Visit: Payer: PPO | Attending: Specialist

## 2015-10-19 DIAGNOSIS — G4761 Periodic limb movement disorder: Secondary | ICD-10-CM | POA: Diagnosis not present

## 2015-10-19 DIAGNOSIS — G4733 Obstructive sleep apnea (adult) (pediatric): Secondary | ICD-10-CM | POA: Insufficient documentation

## 2015-10-24 DIAGNOSIS — F322 Major depressive disorder, single episode, severe without psychotic features: Secondary | ICD-10-CM | POA: Diagnosis not present

## 2015-10-25 DIAGNOSIS — F322 Major depressive disorder, single episode, severe without psychotic features: Secondary | ICD-10-CM | POA: Diagnosis not present

## 2015-11-03 DIAGNOSIS — F322 Major depressive disorder, single episode, severe without psychotic features: Secondary | ICD-10-CM | POA: Diagnosis not present

## 2015-11-05 DIAGNOSIS — F322 Major depressive disorder, single episode, severe without psychotic features: Secondary | ICD-10-CM | POA: Diagnosis not present

## 2015-11-06 DIAGNOSIS — F322 Major depressive disorder, single episode, severe without psychotic features: Secondary | ICD-10-CM | POA: Diagnosis not present

## 2015-11-07 DIAGNOSIS — F322 Major depressive disorder, single episode, severe without psychotic features: Secondary | ICD-10-CM | POA: Diagnosis not present

## 2015-11-14 DIAGNOSIS — F322 Major depressive disorder, single episode, severe without psychotic features: Secondary | ICD-10-CM | POA: Diagnosis not present

## 2015-11-28 DIAGNOSIS — F322 Major depressive disorder, single episode, severe without psychotic features: Secondary | ICD-10-CM | POA: Diagnosis not present

## 2015-12-06 ENCOUNTER — Other Ambulatory Visit: Payer: Self-pay | Admitting: Internal Medicine

## 2015-12-06 DIAGNOSIS — Z1231 Encounter for screening mammogram for malignant neoplasm of breast: Secondary | ICD-10-CM

## 2015-12-13 DIAGNOSIS — F322 Major depressive disorder, single episode, severe without psychotic features: Secondary | ICD-10-CM | POA: Diagnosis not present

## 2015-12-22 ENCOUNTER — Other Ambulatory Visit: Payer: Self-pay | Admitting: Internal Medicine

## 2015-12-22 ENCOUNTER — Ambulatory Visit
Admission: RE | Admit: 2015-12-22 | Discharge: 2015-12-22 | Disposition: A | Discharge status: Home / Self Care | Payer: PPO | Source: Ambulatory Visit | Attending: Internal Medicine | Admitting: Internal Medicine

## 2015-12-22 DIAGNOSIS — Z1231 Encounter for screening mammogram for malignant neoplasm of breast: Secondary | ICD-10-CM | POA: Diagnosis not present

## 2015-12-27 DIAGNOSIS — F322 Major depressive disorder, single episode, severe without psychotic features: Secondary | ICD-10-CM | POA: Diagnosis not present

## 2016-01-10 DIAGNOSIS — F322 Major depressive disorder, single episode, severe without psychotic features: Secondary | ICD-10-CM | POA: Diagnosis not present

## 2016-01-16 DIAGNOSIS — Z794 Long term (current) use of insulin: Secondary | ICD-10-CM | POA: Diagnosis not present

## 2016-01-16 DIAGNOSIS — Z79899 Other long term (current) drug therapy: Secondary | ICD-10-CM | POA: Diagnosis not present

## 2016-01-16 DIAGNOSIS — E669 Obesity, unspecified: Secondary | ICD-10-CM | POA: Diagnosis not present

## 2016-01-16 DIAGNOSIS — I10 Essential (primary) hypertension: Secondary | ICD-10-CM | POA: Diagnosis not present

## 2016-01-16 DIAGNOSIS — E118 Type 2 diabetes mellitus with unspecified complications: Secondary | ICD-10-CM | POA: Diagnosis not present

## 2016-01-16 DIAGNOSIS — J449 Chronic obstructive pulmonary disease, unspecified: Secondary | ICD-10-CM | POA: Diagnosis not present

## 2016-01-16 DIAGNOSIS — E782 Mixed hyperlipidemia: Secondary | ICD-10-CM | POA: Diagnosis not present

## 2016-01-24 DIAGNOSIS — F322 Major depressive disorder, single episode, severe without psychotic features: Secondary | ICD-10-CM | POA: Diagnosis not present

## 2016-02-07 DIAGNOSIS — F322 Major depressive disorder, single episode, severe without psychotic features: Secondary | ICD-10-CM | POA: Diagnosis not present

## 2016-02-20 DIAGNOSIS — F322 Major depressive disorder, single episode, severe without psychotic features: Secondary | ICD-10-CM | POA: Diagnosis not present

## 2016-03-04 DIAGNOSIS — F322 Major depressive disorder, single episode, severe without psychotic features: Secondary | ICD-10-CM | POA: Diagnosis not present

## 2016-03-05 DIAGNOSIS — F322 Major depressive disorder, single episode, severe without psychotic features: Secondary | ICD-10-CM | POA: Diagnosis not present

## 2016-03-06 DIAGNOSIS — F322 Major depressive disorder, single episode, severe without psychotic features: Secondary | ICD-10-CM | POA: Diagnosis not present

## 2016-03-20 DIAGNOSIS — F322 Major depressive disorder, single episode, severe without psychotic features: Secondary | ICD-10-CM | POA: Diagnosis not present

## 2016-04-03 DIAGNOSIS — F322 Major depressive disorder, single episode, severe without psychotic features: Secondary | ICD-10-CM | POA: Diagnosis not present

## 2016-04-17 DIAGNOSIS — F322 Major depressive disorder, single episode, severe without psychotic features: Secondary | ICD-10-CM | POA: Diagnosis not present

## 2016-04-20 DIAGNOSIS — F322 Major depressive disorder, single episode, severe without psychotic features: Secondary | ICD-10-CM | POA: Diagnosis not present

## 2016-05-01 DIAGNOSIS — F322 Major depressive disorder, single episode, severe without psychotic features: Secondary | ICD-10-CM | POA: Diagnosis not present

## 2016-05-14 DIAGNOSIS — F322 Major depressive disorder, single episode, severe without psychotic features: Secondary | ICD-10-CM | POA: Diagnosis not present

## 2016-05-29 DIAGNOSIS — F322 Major depressive disorder, single episode, severe without psychotic features: Secondary | ICD-10-CM | POA: Diagnosis not present

## 2016-06-01 DIAGNOSIS — F322 Major depressive disorder, single episode, severe without psychotic features: Secondary | ICD-10-CM | POA: Diagnosis not present

## 2016-06-02 DIAGNOSIS — F322 Major depressive disorder, single episode, severe without psychotic features: Secondary | ICD-10-CM | POA: Diagnosis not present

## 2016-06-03 DIAGNOSIS — F322 Major depressive disorder, single episode, severe without psychotic features: Secondary | ICD-10-CM | POA: Diagnosis not present

## 2016-06-04 DIAGNOSIS — Z794 Long term (current) use of insulin: Secondary | ICD-10-CM | POA: Diagnosis not present

## 2016-06-04 DIAGNOSIS — Z79899 Other long term (current) drug therapy: Secondary | ICD-10-CM | POA: Diagnosis not present

## 2016-06-04 DIAGNOSIS — E782 Mixed hyperlipidemia: Secondary | ICD-10-CM | POA: Diagnosis not present

## 2016-06-04 DIAGNOSIS — E118 Type 2 diabetes mellitus with unspecified complications: Secondary | ICD-10-CM | POA: Diagnosis not present

## 2016-06-04 DIAGNOSIS — I10 Essential (primary) hypertension: Secondary | ICD-10-CM | POA: Diagnosis not present

## 2016-06-04 DIAGNOSIS — N390 Urinary tract infection, site not specified: Secondary | ICD-10-CM | POA: Diagnosis not present

## 2016-06-07 DIAGNOSIS — F3341 Major depressive disorder, recurrent, in partial remission: Secondary | ICD-10-CM | POA: Diagnosis not present

## 2016-06-07 DIAGNOSIS — I10 Essential (primary) hypertension: Secondary | ICD-10-CM | POA: Diagnosis not present

## 2016-06-07 DIAGNOSIS — F418 Other specified anxiety disorders: Secondary | ICD-10-CM | POA: Diagnosis not present

## 2016-06-07 DIAGNOSIS — N390 Urinary tract infection, site not specified: Secondary | ICD-10-CM | POA: Diagnosis not present

## 2016-06-07 DIAGNOSIS — E119 Type 2 diabetes mellitus without complications: Secondary | ICD-10-CM | POA: Diagnosis not present

## 2016-06-07 DIAGNOSIS — Z0100 Encounter for examination of eyes and vision without abnormal findings: Secondary | ICD-10-CM | POA: Diagnosis not present

## 2016-06-07 DIAGNOSIS — E782 Mixed hyperlipidemia: Secondary | ICD-10-CM | POA: Diagnosis not present

## 2016-06-07 DIAGNOSIS — Z794 Long term (current) use of insulin: Secondary | ICD-10-CM | POA: Diagnosis not present

## 2016-06-07 DIAGNOSIS — E118 Type 2 diabetes mellitus with unspecified complications: Secondary | ICD-10-CM | POA: Diagnosis not present

## 2016-06-07 DIAGNOSIS — Z0000 Encounter for general adult medical examination without abnormal findings: Secondary | ICD-10-CM | POA: Diagnosis not present

## 2016-06-12 DIAGNOSIS — F322 Major depressive disorder, single episode, severe without psychotic features: Secondary | ICD-10-CM | POA: Diagnosis not present

## 2016-06-26 DIAGNOSIS — F322 Major depressive disorder, single episode, severe without psychotic features: Secondary | ICD-10-CM | POA: Diagnosis not present

## 2016-07-10 DIAGNOSIS — F322 Major depressive disorder, single episode, severe without psychotic features: Secondary | ICD-10-CM | POA: Diagnosis not present

## 2016-07-16 DIAGNOSIS — E113293 Type 2 diabetes mellitus with mild nonproliferative diabetic retinopathy without macular edema, bilateral: Secondary | ICD-10-CM | POA: Diagnosis not present

## 2016-07-16 DIAGNOSIS — F322 Major depressive disorder, single episode, severe without psychotic features: Secondary | ICD-10-CM | POA: Diagnosis not present

## 2016-07-31 DIAGNOSIS — F322 Major depressive disorder, single episode, severe without psychotic features: Secondary | ICD-10-CM | POA: Diagnosis not present

## 2016-08-07 DIAGNOSIS — F322 Major depressive disorder, single episode, severe without psychotic features: Secondary | ICD-10-CM | POA: Diagnosis not present

## 2016-08-20 DIAGNOSIS — F322 Major depressive disorder, single episode, severe without psychotic features: Secondary | ICD-10-CM | POA: Diagnosis not present

## 2016-08-30 DIAGNOSIS — F322 Major depressive disorder, single episode, severe without psychotic features: Secondary | ICD-10-CM | POA: Diagnosis not present

## 2016-09-10 DIAGNOSIS — F322 Major depressive disorder, single episode, severe without psychotic features: Secondary | ICD-10-CM | POA: Diagnosis not present

## 2016-10-05 DIAGNOSIS — F3341 Major depressive disorder, recurrent, in partial remission: Secondary | ICD-10-CM | POA: Diagnosis not present

## 2016-10-05 DIAGNOSIS — I10 Essential (primary) hypertension: Secondary | ICD-10-CM | POA: Diagnosis not present

## 2016-10-05 DIAGNOSIS — E782 Mixed hyperlipidemia: Secondary | ICD-10-CM | POA: Diagnosis not present

## 2016-10-05 DIAGNOSIS — E118 Type 2 diabetes mellitus with unspecified complications: Secondary | ICD-10-CM | POA: Diagnosis not present

## 2016-10-05 DIAGNOSIS — M25561 Pain in right knee: Secondary | ICD-10-CM | POA: Diagnosis not present

## 2016-10-05 DIAGNOSIS — J449 Chronic obstructive pulmonary disease, unspecified: Secondary | ICD-10-CM | POA: Diagnosis not present

## 2016-10-05 DIAGNOSIS — Z1231 Encounter for screening mammogram for malignant neoplasm of breast: Secondary | ICD-10-CM | POA: Diagnosis not present

## 2016-10-05 DIAGNOSIS — Z794 Long term (current) use of insulin: Secondary | ICD-10-CM | POA: Diagnosis not present

## 2016-10-05 DIAGNOSIS — Z79899 Other long term (current) drug therapy: Secondary | ICD-10-CM | POA: Diagnosis not present

## 2016-10-08 DIAGNOSIS — F322 Major depressive disorder, single episode, severe without psychotic features: Secondary | ICD-10-CM | POA: Diagnosis not present

## 2016-10-16 DIAGNOSIS — F322 Major depressive disorder, single episode, severe without psychotic features: Secondary | ICD-10-CM | POA: Diagnosis not present

## 2016-10-23 DIAGNOSIS — F322 Major depressive disorder, single episode, severe without psychotic features: Secondary | ICD-10-CM | POA: Diagnosis not present

## 2016-11-01 DIAGNOSIS — F322 Major depressive disorder, single episode, severe without psychotic features: Secondary | ICD-10-CM | POA: Diagnosis not present

## 2016-11-02 DIAGNOSIS — F322 Major depressive disorder, single episode, severe without psychotic features: Secondary | ICD-10-CM | POA: Diagnosis not present

## 2016-11-09 ENCOUNTER — Other Ambulatory Visit: Payer: Self-pay | Admitting: Internal Medicine

## 2016-11-09 DIAGNOSIS — F418 Other specified anxiety disorders: Secondary | ICD-10-CM | POA: Diagnosis not present

## 2016-11-09 DIAGNOSIS — M79604 Pain in right leg: Secondary | ICD-10-CM | POA: Diagnosis not present

## 2016-11-09 DIAGNOSIS — J449 Chronic obstructive pulmonary disease, unspecified: Secondary | ICD-10-CM | POA: Diagnosis not present

## 2016-11-09 DIAGNOSIS — E782 Mixed hyperlipidemia: Secondary | ICD-10-CM | POA: Diagnosis not present

## 2016-11-09 DIAGNOSIS — E118 Type 2 diabetes mellitus with unspecified complications: Secondary | ICD-10-CM | POA: Diagnosis not present

## 2016-11-09 DIAGNOSIS — Z794 Long term (current) use of insulin: Secondary | ICD-10-CM | POA: Diagnosis not present

## 2016-11-09 DIAGNOSIS — I10 Essential (primary) hypertension: Secondary | ICD-10-CM | POA: Diagnosis not present

## 2016-11-09 DIAGNOSIS — M159 Polyosteoarthritis, unspecified: Secondary | ICD-10-CM | POA: Diagnosis not present

## 2016-11-13 DIAGNOSIS — F322 Major depressive disorder, single episode, severe without psychotic features: Secondary | ICD-10-CM | POA: Diagnosis not present

## 2016-11-14 ENCOUNTER — Ambulatory Visit: Payer: PPO

## 2016-11-27 DIAGNOSIS — F322 Major depressive disorder, single episode, severe without psychotic features: Secondary | ICD-10-CM | POA: Diagnosis not present

## 2016-11-28 ENCOUNTER — Ambulatory Visit
Admission: RE | Admit: 2016-11-28 | Discharge: 2016-11-28 | Disposition: A | Discharge status: Home / Self Care | Payer: PPO | Source: Ambulatory Visit | Attending: Internal Medicine | Admitting: Internal Medicine

## 2016-11-28 DIAGNOSIS — M79604 Pain in right leg: Secondary | ICD-10-CM | POA: Insufficient documentation

## 2016-11-28 DIAGNOSIS — M79661 Pain in right lower leg: Secondary | ICD-10-CM | POA: Diagnosis not present

## 2016-12-07 DIAGNOSIS — M5441 Lumbago with sciatica, right side: Secondary | ICD-10-CM | POA: Diagnosis not present

## 2016-12-07 DIAGNOSIS — M1711 Unilateral primary osteoarthritis, right knee: Secondary | ICD-10-CM | POA: Diagnosis not present

## 2016-12-07 DIAGNOSIS — M25561 Pain in right knee: Secondary | ICD-10-CM | POA: Diagnosis not present

## 2016-12-11 DIAGNOSIS — F322 Major depressive disorder, single episode, severe without psychotic features: Secondary | ICD-10-CM | POA: Diagnosis not present

## 2016-12-25 DIAGNOSIS — F322 Major depressive disorder, single episode, severe without psychotic features: Secondary | ICD-10-CM | POA: Diagnosis not present

## 2017-01-08 DIAGNOSIS — F322 Major depressive disorder, single episode, severe without psychotic features: Secondary | ICD-10-CM | POA: Diagnosis not present

## 2017-01-15 DIAGNOSIS — F322 Major depressive disorder, single episode, severe without psychotic features: Secondary | ICD-10-CM | POA: Diagnosis not present

## 2017-01-29 DIAGNOSIS — F322 Major depressive disorder, single episode, severe without psychotic features: Secondary | ICD-10-CM | POA: Diagnosis not present

## 2017-02-04 DIAGNOSIS — Z794 Long term (current) use of insulin: Secondary | ICD-10-CM | POA: Diagnosis not present

## 2017-02-04 DIAGNOSIS — Z0000 Encounter for general adult medical examination without abnormal findings: Secondary | ICD-10-CM | POA: Diagnosis not present

## 2017-02-04 DIAGNOSIS — R011 Cardiac murmur, unspecified: Secondary | ICD-10-CM | POA: Diagnosis not present

## 2017-02-04 DIAGNOSIS — Z79899 Other long term (current) drug therapy: Secondary | ICD-10-CM | POA: Diagnosis not present

## 2017-02-04 DIAGNOSIS — E782 Mixed hyperlipidemia: Secondary | ICD-10-CM | POA: Diagnosis not present

## 2017-02-04 DIAGNOSIS — E118 Type 2 diabetes mellitus with unspecified complications: Secondary | ICD-10-CM | POA: Diagnosis not present

## 2017-02-04 DIAGNOSIS — I10 Essential (primary) hypertension: Secondary | ICD-10-CM | POA: Diagnosis not present

## 2017-02-04 DIAGNOSIS — F3341 Major depressive disorder, recurrent, in partial remission: Secondary | ICD-10-CM | POA: Diagnosis not present

## 2017-02-04 DIAGNOSIS — J449 Chronic obstructive pulmonary disease, unspecified: Secondary | ICD-10-CM | POA: Diagnosis not present

## 2017-02-12 DIAGNOSIS — F322 Major depressive disorder, single episode, severe without psychotic features: Secondary | ICD-10-CM | POA: Diagnosis not present

## 2017-02-26 DIAGNOSIS — F322 Major depressive disorder, single episode, severe without psychotic features: Secondary | ICD-10-CM | POA: Diagnosis not present

## 2017-02-27 DIAGNOSIS — R011 Cardiac murmur, unspecified: Secondary | ICD-10-CM | POA: Diagnosis not present

## 2017-03-08 DIAGNOSIS — F322 Major depressive disorder, single episode, severe without psychotic features: Secondary | ICD-10-CM | POA: Diagnosis not present

## 2017-03-26 ENCOUNTER — Observation Stay
Admission: EM | Admit: 2017-03-26 | Discharge: 2017-03-27 | Disposition: A | Discharge status: Home / Self Care | Payer: PPO | Attending: Surgery | Admitting: Surgery

## 2017-03-26 ENCOUNTER — Emergency Department: Payer: PPO

## 2017-03-26 ENCOUNTER — Emergency Department: Payer: PPO | Admitting: Anesthesiology

## 2017-03-26 ENCOUNTER — Encounter: Payer: Self-pay | Admitting: Emergency Medicine

## 2017-03-26 ENCOUNTER — Other Ambulatory Visit: Payer: Self-pay

## 2017-03-26 ENCOUNTER — Encounter
Admission: EM | Disposition: A | Discharge status: Home / Self Care | Payer: Self-pay | Source: Home / Self Care | Attending: Emergency Medicine

## 2017-03-26 DIAGNOSIS — E785 Hyperlipidemia, unspecified: Secondary | ICD-10-CM | POA: Insufficient documentation

## 2017-03-26 DIAGNOSIS — Z8249 Family history of ischemic heart disease and other diseases of the circulatory system: Secondary | ICD-10-CM | POA: Diagnosis not present

## 2017-03-26 DIAGNOSIS — Z87891 Personal history of nicotine dependence: Secondary | ICD-10-CM | POA: Diagnosis not present

## 2017-03-26 DIAGNOSIS — Z823 Family history of stroke: Secondary | ICD-10-CM | POA: Diagnosis not present

## 2017-03-26 DIAGNOSIS — W1789XA Other fall from one level to another, initial encounter: Secondary | ICD-10-CM | POA: Insufficient documentation

## 2017-03-26 DIAGNOSIS — Z79899 Other long term (current) drug therapy: Secondary | ICD-10-CM | POA: Diagnosis not present

## 2017-03-26 DIAGNOSIS — Z794 Long term (current) use of insulin: Secondary | ICD-10-CM | POA: Insufficient documentation

## 2017-03-26 DIAGNOSIS — J449 Chronic obstructive pulmonary disease, unspecified: Secondary | ICD-10-CM | POA: Diagnosis not present

## 2017-03-26 DIAGNOSIS — I10 Essential (primary) hypertension: Secondary | ICD-10-CM | POA: Diagnosis not present

## 2017-03-26 DIAGNOSIS — G4730 Sleep apnea, unspecified: Secondary | ICD-10-CM | POA: Diagnosis not present

## 2017-03-26 DIAGNOSIS — Z6838 Body mass index (BMI) 38.0-38.9, adult: Secondary | ICD-10-CM | POA: Insufficient documentation

## 2017-03-26 DIAGNOSIS — Z7982 Long term (current) use of aspirin: Secondary | ICD-10-CM | POA: Diagnosis not present

## 2017-03-26 DIAGNOSIS — F329 Major depressive disorder, single episode, unspecified: Secondary | ICD-10-CM | POA: Diagnosis not present

## 2017-03-26 DIAGNOSIS — S82832A Other fracture of upper and lower end of left fibula, initial encounter for closed fracture: Principal | ICD-10-CM | POA: Insufficient documentation

## 2017-03-26 DIAGNOSIS — Z01810 Encounter for preprocedural cardiovascular examination: Secondary | ICD-10-CM | POA: Diagnosis not present

## 2017-03-26 DIAGNOSIS — S82892A Other fracture of left lower leg, initial encounter for closed fracture: Secondary | ICD-10-CM | POA: Diagnosis not present

## 2017-03-26 DIAGNOSIS — E669 Obesity, unspecified: Secondary | ICD-10-CM | POA: Insufficient documentation

## 2017-03-26 DIAGNOSIS — T148XXA Other injury of unspecified body region, initial encounter: Secondary | ICD-10-CM

## 2017-03-26 DIAGNOSIS — Z9889 Other specified postprocedural states: Secondary | ICD-10-CM | POA: Diagnosis not present

## 2017-03-26 DIAGNOSIS — E119 Type 2 diabetes mellitus without complications: Secondary | ICD-10-CM | POA: Insufficient documentation

## 2017-03-26 DIAGNOSIS — G8918 Other acute postprocedural pain: Secondary | ICD-10-CM | POA: Diagnosis not present

## 2017-03-26 DIAGNOSIS — S8292XA Unspecified fracture of left lower leg, initial encounter for closed fracture: Secondary | ICD-10-CM | POA: Diagnosis not present

## 2017-03-26 DIAGNOSIS — M25572 Pain in left ankle and joints of left foot: Secondary | ICD-10-CM | POA: Diagnosis not present

## 2017-03-26 HISTORY — PX: ORIF ANKLE FRACTURE: SHX5408

## 2017-03-26 LAB — CBC WITH DIFFERENTIAL/PLATELET
BASOS ABS: 0.1 10*3/uL (ref 0–0.1)
Basophils Relative: 1 %
Eosinophils Absolute: 0.1 10*3/uL (ref 0–0.7)
Eosinophils Relative: 1 %
HCT: 40.8 % (ref 35.0–47.0)
HEMOGLOBIN: 13.5 g/dL (ref 12.0–16.0)
LYMPHS ABS: 1.1 10*3/uL (ref 1.0–3.6)
LYMPHS PCT: 9 %
MCH: 26.5 pg (ref 26.0–34.0)
MCHC: 33.2 g/dL (ref 32.0–36.0)
MCV: 79.9 fL — AB (ref 80.0–100.0)
Monocytes Absolute: 0.7 10*3/uL (ref 0.2–0.9)
Monocytes Relative: 6 %
NEUTROS ABS: 11.1 10*3/uL — AB (ref 1.4–6.5)
NEUTROS PCT: 83 %
PLATELETS: 226 10*3/uL (ref 150–440)
RBC: 5.1 MIL/uL (ref 3.80–5.20)
RDW: 14.7 % — ABNORMAL HIGH (ref 11.5–14.5)
WBC: 13.2 10*3/uL — AB (ref 3.6–11.0)

## 2017-03-26 LAB — BASIC METABOLIC PANEL
ANION GAP: 11 (ref 5–15)
BUN: 26 mg/dL — ABNORMAL HIGH (ref 6–20)
CHLORIDE: 99 mmol/L — AB (ref 101–111)
CO2: 28 mmol/L (ref 22–32)
Calcium: 9.5 mg/dL (ref 8.9–10.3)
Creatinine, Ser: 0.86 mg/dL (ref 0.44–1.00)
Glucose, Bld: 122 mg/dL — ABNORMAL HIGH (ref 65–99)
POTASSIUM: 3.8 mmol/L (ref 3.5–5.1)
SODIUM: 138 mmol/L (ref 135–145)

## 2017-03-26 LAB — PROTIME-INR
INR: 0.91
Prothrombin Time: 12.2 seconds (ref 11.4–15.2)

## 2017-03-26 LAB — GLUCOSE, CAPILLARY
GLUCOSE-CAPILLARY: 92 mg/dL (ref 65–99)
GLUCOSE-CAPILLARY: 167 mg/dL — AB (ref 65–99)

## 2017-03-26 LAB — APTT: APTT: 27 s (ref 24–36)

## 2017-03-26 SURGERY — OPEN REDUCTION INTERNAL FIXATION (ORIF) ANKLE FRACTURE
Anesthesia: General | Site: Ankle | Laterality: Left | Wound class: Clean

## 2017-03-26 MED ORDER — ACETAMINOPHEN 500 MG PO TABS
1000.0000 mg | ORAL_TABLET | Freq: Four times a day (QID) | ORAL | Status: DC
Start: 2017-03-26 — End: 2017-03-27
  Administered 2017-03-26 – 2017-03-27 (×3): 1000 mg via ORAL
  Filled 2017-03-26 (×3): qty 2

## 2017-03-26 MED ORDER — DOCUSATE SODIUM 100 MG PO CAPS
100.0000 mg | ORAL_CAPSULE | Freq: Two times a day (BID) | ORAL | Status: DC
  Administered 2017-03-26 – 2017-03-27 (×2): 100 mg via ORAL
  Filled 2017-03-26 (×2): qty 1

## 2017-03-26 MED ORDER — MORPHINE SULFATE (PF) 4 MG/ML IV SOLN
INTRAVENOUS | Status: AC
  Filled 2017-03-26: qty 1

## 2017-03-26 MED ORDER — OXYCODONE HCL 5 MG/5ML PO SOLN: 5.0000 mg | Freq: Once | ORAL | Status: DC | PRN

## 2017-03-26 MED ORDER — FLEET ENEMA 7-19 GM/118ML RE ENEM: 1.0000 | ENEMA | Freq: Once | RECTAL | Status: DC | PRN

## 2017-03-26 MED ORDER — EPHEDRINE SULFATE 50 MG/ML IJ SOLN
INTRAMUSCULAR | Status: DC | PRN
  Administered 2017-03-26 (×3): 5 mg via INTRAVENOUS

## 2017-03-26 MED ORDER — KETOROLAC TROMETHAMINE 15 MG/ML IJ SOLN
INTRAMUSCULAR | Status: AC
  Filled 2017-03-26: qty 1

## 2017-03-26 MED ORDER — ALBUTEROL SULFATE HFA 108 (90 BASE) MCG/ACT IN AERS
INHALATION_SPRAY | RESPIRATORY_TRACT | Status: AC
  Filled 2017-03-26: qty 6.7

## 2017-03-26 MED ORDER — INSULIN ASPART 100 UNIT/ML ~~LOC~~ SOLN
0.0000 [IU] | Freq: Three times a day (TID) | SUBCUTANEOUS | Status: DC
  Administered 2017-03-27: 2 [IU] via SUBCUTANEOUS
  Filled 2017-03-26: qty 1

## 2017-03-26 MED ORDER — MONTELUKAST SODIUM 10 MG PO TABS
10.0000 mg | ORAL_TABLET | Freq: Every day | ORAL | Status: DC
  Administered 2017-03-26: 10 mg via ORAL
  Filled 2017-03-26: qty 1

## 2017-03-26 MED ORDER — KETOROLAC TROMETHAMINE 15 MG/ML IJ SOLN
7.5000 mg | Freq: Four times a day (QID) | INTRAMUSCULAR | Status: DC
  Administered 2017-03-26 – 2017-03-27 (×3): 7.5 mg via INTRAVENOUS
  Filled 2017-03-26 (×3): qty 1

## 2017-03-26 MED ORDER — FENTANYL CITRATE (PF) 100 MCG/2ML IJ SOLN
INTRAMUSCULAR | Status: AC
  Filled 2017-03-26: qty 2

## 2017-03-26 MED ORDER — KETOROLAC TROMETHAMINE 15 MG/ML IJ SOLN
15.0000 mg | Freq: Once | INTRAMUSCULAR | Status: AC
  Administered 2017-03-26: 15 mg via INTRAVENOUS

## 2017-03-26 MED ORDER — OXYCODONE HCL 5 MG PO TABS: 10.0000 mg | ORAL_TABLET | ORAL | Status: DC | PRN

## 2017-03-26 MED ORDER — SUGAMMADEX SODIUM 200 MG/2ML IV SOLN
INTRAVENOUS | Status: AC
  Filled 2017-03-26: qty 2

## 2017-03-26 MED ORDER — CEFAZOLIN SODIUM 1 G IJ SOLR
INTRAMUSCULAR | Status: AC
  Filled 2017-03-26: qty 20

## 2017-03-26 MED ORDER — FENTANYL CITRATE (PF) 100 MCG/2ML IJ SOLN
INTRAMUSCULAR | Status: DC | PRN
  Administered 2017-03-26: 100 ug via INTRAVENOUS
  Administered 2017-03-26: 50 ug via INTRAVENOUS

## 2017-03-26 MED ORDER — CEFAZOLIN SODIUM-DEXTROSE 2-3 GM-%(50ML) IV SOLR
INTRAVENOUS | Status: DC | PRN
  Administered 2017-03-26: 2 g via INTRAVENOUS

## 2017-03-26 MED ORDER — BUPROPION HCL ER (XL) 150 MG PO TB24
300.0000 mg | ORAL_TABLET | Freq: Every day | ORAL | Status: DC
Start: 2017-03-26 — End: 2017-03-27
  Administered 2017-03-26 – 2017-03-27 (×2): 300 mg via ORAL
  Filled 2017-03-26 (×2): qty 2

## 2017-03-26 MED ORDER — ATORVASTATIN CALCIUM 20 MG PO TABS
80.0000 mg | ORAL_TABLET | Freq: Every day | ORAL | Status: DC
  Administered 2017-03-27: 80 mg via ORAL
  Filled 2017-03-26: qty 4

## 2017-03-26 MED ORDER — CHOLECALCIFEROL 10 MCG (400 UNIT) PO TABS
400.0000 [IU] | ORAL_TABLET | Freq: Every day | ORAL | Status: DC
  Filled 2017-03-26 (×2): qty 1

## 2017-03-26 MED ORDER — ASPIRIN EC 81 MG PO TBEC: 81.0000 mg | DELAYED_RELEASE_TABLET | Freq: Every day | ORAL | Status: DC

## 2017-03-26 MED ORDER — BISACODYL 10 MG RE SUPP: 10.0000 mg | Freq: Every day | RECTAL | Status: DC | PRN

## 2017-03-26 MED ORDER — LACTATED RINGERS IV SOLN
INTRAVENOUS | Status: DC | PRN
  Administered 2017-03-26: 16:00:00 via INTRAVENOUS

## 2017-03-26 MED ORDER — ROPIVACAINE HCL 5 MG/ML IJ SOLN
INTRAMUSCULAR | Status: AC
  Filled 2017-03-26: qty 30

## 2017-03-26 MED ORDER — ROPIVACAINE HCL 5 MG/ML IJ SOLN
INTRAMUSCULAR | Status: DC | PRN
  Administered 2017-03-26: 20 mL via PERINEURAL
  Administered 2017-03-26: 10 mL via PERINEURAL

## 2017-03-26 MED ORDER — ATENOLOL 25 MG PO TABS
25.0000 mg | ORAL_TABLET | Freq: Every day | ORAL | Status: DC
  Administered 2017-03-26 – 2017-03-27 (×2): 25 mg via ORAL
  Filled 2017-03-26 (×2): qty 1

## 2017-03-26 MED ORDER — ONDANSETRON HCL 4 MG/2ML IJ SOLN: 4.0000 mg | Freq: Four times a day (QID) | INTRAMUSCULAR | Status: DC | PRN

## 2017-03-26 MED ORDER — ROCURONIUM BROMIDE 100 MG/10ML IV SOLN
INTRAVENOUS | Status: DC | PRN
  Administered 2017-03-26: 30 mg via INTRAVENOUS

## 2017-03-26 MED ORDER — DIPHENHYDRAMINE HCL 12.5 MG/5ML PO ELIX: 12.5000 mg | ORAL_SOLUTION | ORAL | Status: DC | PRN

## 2017-03-26 MED ORDER — METOCLOPRAMIDE HCL 10 MG PO TABS: 5.0000 mg | ORAL_TABLET | Freq: Three times a day (TID) | ORAL | Status: DC | PRN

## 2017-03-26 MED ORDER — ACETAMINOPHEN 325 MG PO TABS: 650.0000 mg | ORAL_TABLET | ORAL | Status: DC | PRN

## 2017-03-26 MED ORDER — OXYCODONE HCL 5 MG PO TABS: 5.0000 mg | ORAL_TABLET | Freq: Once | ORAL | Status: DC | PRN

## 2017-03-26 MED ORDER — DEXAMETHASONE SODIUM PHOSPHATE 10 MG/ML IJ SOLN
INTRAMUSCULAR | Status: DC | PRN
  Administered 2017-03-26: 10 mg via INTRAVENOUS

## 2017-03-26 MED ORDER — HYDROCHLOROTHIAZIDE 25 MG PO TABS
25.0000 mg | ORAL_TABLET | Freq: Every day | ORAL | Status: DC
  Administered 2017-03-27: 25 mg via ORAL
  Filled 2017-03-26: qty 1

## 2017-03-26 MED ORDER — LOSARTAN POTASSIUM-HCTZ 100-25 MG PO TABS: 1.0000 | ORAL_TABLET | Freq: Every day | ORAL | Status: DC

## 2017-03-26 MED ORDER — AMLODIPINE BESYLATE 5 MG PO TABS
10.0000 mg | ORAL_TABLET | Freq: Every day | ORAL | Status: DC
  Administered 2017-03-26 – 2017-03-27 (×2): 10 mg via ORAL
  Filled 2017-03-26 (×2): qty 2

## 2017-03-26 MED ORDER — HYDROMORPHONE HCL 1 MG/ML IJ SOLN: 0.5000 mg | INTRAMUSCULAR | Status: DC | PRN

## 2017-03-26 MED ORDER — ALBUTEROL SULFATE (2.5 MG/3ML) 0.083% IN NEBU: 2.5000 mg | INHALATION_SOLUTION | Freq: Four times a day (QID) | RESPIRATORY_TRACT | Status: DC | PRN

## 2017-03-26 MED ORDER — POTASSIUM CHLORIDE IN NACL 20-0.9 MEQ/L-% IV SOLN
INTRAVENOUS | Status: DC
  Administered 2017-03-26: 21:00:00 via INTRAVENOUS
  Filled 2017-03-26 (×3): qty 1000

## 2017-03-26 MED ORDER — ENOXAPARIN SODIUM 40 MG/0.4ML ~~LOC~~ SOLN
40.0000 mg | SUBCUTANEOUS | Status: DC
  Administered 2017-03-27: 40 mg via SUBCUTANEOUS
  Filled 2017-03-26: qty 0.4

## 2017-03-26 MED ORDER — DEXTROSE 5 % IV SOLN
2.0000 g | Freq: Four times a day (QID) | INTRAVENOUS | Status: AC
  Administered 2017-03-26 – 2017-03-27 (×3): 2 g via INTRAVENOUS
  Filled 2017-03-26 (×3): qty 20

## 2017-03-26 MED ORDER — FENTANYL CITRATE (PF) 100 MCG/2ML IJ SOLN: 25.0000 ug | INTRAMUSCULAR | Status: DC | PRN

## 2017-03-26 MED ORDER — PANTOPRAZOLE SODIUM 40 MG PO TBEC
40.0000 mg | DELAYED_RELEASE_TABLET | Freq: Every day | ORAL | Status: DC
  Administered 2017-03-26 – 2017-03-27 (×2): 40 mg via ORAL
  Filled 2017-03-26 (×2): qty 1

## 2017-03-26 MED ORDER — FENTANYL CITRATE (PF) 100 MCG/2ML IJ SOLN
INTRAMUSCULAR | Status: AC
  Administered 2017-03-26: 50 ug via INTRAVENOUS
  Filled 2017-03-26: qty 2

## 2017-03-26 MED ORDER — ALBUTEROL SULFATE HFA 108 (90 BASE) MCG/ACT IN AERS
2.0000 | INHALATION_SPRAY | Freq: Four times a day (QID) | RESPIRATORY_TRACT | Status: DC | PRN
  Administered 2017-03-26: 2 via RESPIRATORY_TRACT

## 2017-03-26 MED ORDER — BUPIVACAINE HCL 0.5 % IJ SOLN
INTRAMUSCULAR | Status: DC | PRN
  Administered 2017-03-26: 20 mL

## 2017-03-26 MED ORDER — PROPOFOL 10 MG/ML IV BOLUS
INTRAVENOUS | Status: DC | PRN
  Administered 2017-03-26: 160 mg via INTRAVENOUS

## 2017-03-26 MED ORDER — FENTANYL CITRATE (PF) 100 MCG/2ML IJ SOLN
50.0000 ug | Freq: Once | INTRAMUSCULAR | Status: AC
  Administered 2017-03-26: 50 ug via INTRAVENOUS

## 2017-03-26 MED ORDER — SUCCINYLCHOLINE CHLORIDE 20 MG/ML IJ SOLN
INTRAMUSCULAR | Status: DC | PRN
  Administered 2017-03-26: 100 mg via INTRAVENOUS

## 2017-03-26 MED ORDER — MIDAZOLAM HCL 2 MG/2ML IJ SOLN
1.0000 mg | Freq: Once | INTRAMUSCULAR | Status: AC
  Administered 2017-03-26: 1 mg via INTRAVENOUS

## 2017-03-26 MED ORDER — METOCLOPRAMIDE HCL 5 MG/ML IJ SOLN: 5.0000 mg | Freq: Three times a day (TID) | INTRAMUSCULAR | Status: DC | PRN

## 2017-03-26 MED ORDER — SUGAMMADEX SODIUM 200 MG/2ML IV SOLN
INTRAVENOUS | Status: DC | PRN
  Administered 2017-03-26: 200 mg via INTRAVENOUS

## 2017-03-26 MED ORDER — BUPIVACAINE HCL (PF) 0.5 % IJ SOLN
INTRAMUSCULAR | Status: AC
  Filled 2017-03-26: qty 30

## 2017-03-26 MED ORDER — MORPHINE SULFATE (PF) 4 MG/ML IV SOLN
4.0000 mg | Freq: Once | INTRAVENOUS | Status: AC
  Administered 2017-03-26: 4 mg via INTRAVENOUS
  Filled 2017-03-26: qty 1

## 2017-03-26 MED ORDER — OXYCODONE HCL 5 MG PO TABS
5.0000 mg | ORAL_TABLET | ORAL | Status: DC | PRN
  Administered 2017-03-27: 5 mg via ORAL
  Filled 2017-03-26: qty 1

## 2017-03-26 MED ORDER — LIDOCAINE HCL (CARDIAC) 20 MG/ML IV SOLN
INTRAVENOUS | Status: DC | PRN
  Administered 2017-03-26: 60 mg via INTRAVENOUS

## 2017-03-26 MED ORDER — INSULIN ASPART 100 UNIT/ML ~~LOC~~ SOLN: 0.0000 [IU] | Freq: Every day | SUBCUTANEOUS | Status: DC

## 2017-03-26 MED ORDER — PHENYLEPHRINE HCL 10 MG/ML IJ SOLN
INTRAMUSCULAR | Status: DC | PRN
  Administered 2017-03-26: 100 ug via INTRAVENOUS
  Administered 2017-03-26: 50 ug via INTRAVENOUS
  Administered 2017-03-26: 100 ug via INTRAVENOUS

## 2017-03-26 MED ORDER — MIDAZOLAM HCL 2 MG/2ML IJ SOLN
INTRAMUSCULAR | Status: AC
  Administered 2017-03-26: 1 mg via INTRAVENOUS
  Filled 2017-03-26: qty 2

## 2017-03-26 MED ORDER — PROPOFOL 10 MG/ML IV BOLUS
INTRAVENOUS | Status: AC
  Filled 2017-03-26: qty 20

## 2017-03-26 MED ORDER — MAGNESIUM HYDROXIDE 400 MG/5ML PO SUSP: 30.0000 mL | Freq: Every day | ORAL | Status: DC | PRN

## 2017-03-26 MED ORDER — ASPIRIN EC 81 MG PO TBEC
81.0000 mg | DELAYED_RELEASE_TABLET | Freq: Every day | ORAL | Status: DC
  Administered 2017-03-27: 81 mg via ORAL
  Filled 2017-03-26: qty 1

## 2017-03-26 MED ORDER — LIDOCAINE HCL (PF) 1 % IJ SOLN
INTRAMUSCULAR | Status: DC | PRN
  Administered 2017-03-26: 1 mL via INTRADERMAL

## 2017-03-26 MED ORDER — ACETAMINOPHEN 650 MG RE SUPP: 650.0000 mg | RECTAL | Status: DC | PRN

## 2017-03-26 MED ORDER — ONDANSETRON HCL 4 MG PO TABS: 4.0000 mg | ORAL_TABLET | Freq: Four times a day (QID) | ORAL | Status: DC | PRN

## 2017-03-26 MED ORDER — LIDOCAINE HCL (PF) 1 % IJ SOLN
INTRAMUSCULAR | Status: AC
  Filled 2017-03-26: qty 5

## 2017-03-26 MED ORDER — LOSARTAN POTASSIUM 50 MG PO TABS
100.0000 mg | ORAL_TABLET | Freq: Every day | ORAL | Status: DC
  Administered 2017-03-27: 100 mg via ORAL
  Filled 2017-03-26: qty 2

## 2017-03-26 MED ORDER — NEOMYCIN-POLYMYXIN B GU 40-200000 IR SOLN
Status: DC | PRN
  Administered 2017-03-26: 4 mL

## 2017-03-26 MED ORDER — ONDANSETRON HCL 4 MG/2ML IJ SOLN
4.0000 mg | Freq: Once | INTRAMUSCULAR | Status: AC
  Administered 2017-03-26: 4 mg via INTRAVENOUS
  Filled 2017-03-26: qty 2

## 2017-03-26 SURGICAL SUPPLY — 55 items
BANDAGE ACE 4X5 VEL STRL LF (GAUZE/BANDAGES/DRESSINGS) ×3 IMPLANT
BANDAGE ACE 6X5 VEL STRL LF (GAUZE/BANDAGES/DRESSINGS) ×3 IMPLANT
BIT DRILL 2.5X2.75 QC CALB (BIT) ×3 IMPLANT
BIT DRILL 3.5X5.5 QC CALB (BIT) ×3 IMPLANT
BIT DRILL CALIBRATED 2.7 (BIT) ×2 IMPLANT
BIT DRILL CALIBRATED 2.7MM (BIT) ×1
BLADE SURG SZ10 CARB STEEL (BLADE) ×6 IMPLANT
BNDG COHESIVE 4X5 TAN STRL (GAUZE/BANDAGES/DRESSINGS) ×3 IMPLANT
BNDG ESMARK 6X12 TAN STRL LF (GAUZE/BANDAGES/DRESSINGS) ×3 IMPLANT
BNDG PLASTER FAST 4X5 WHT LF (CAST SUPPLIES) ×3 IMPLANT
CANISTER SUCT 1200ML W/VALVE (MISCELLANEOUS) ×3 IMPLANT
CHLORAPREP W/TINT 26ML (MISCELLANEOUS) ×6 IMPLANT
CUFF TOURN 24 STER (MISCELLANEOUS) ×3 IMPLANT
CUFF TOURN 30 STER DUAL PORT (MISCELLANEOUS) IMPLANT
DRAPE C-ARM XRAY 36X54 (DRAPES) ×3 IMPLANT
DRAPE C-ARMOR (DRAPES) ×3 IMPLANT
DRAPE INCISE IOBAN 66X45 STRL (DRAPES) ×3 IMPLANT
DRAPE U-SHAPE 47X51 STRL (DRAPES) ×3 IMPLANT
ELECT CAUTERY BLADE 6.4 (BLADE) ×3 IMPLANT
ELECT REM PT RETURN 9FT ADLT (ELECTROSURGICAL) ×3
ELECTRODE REM PT RTRN 9FT ADLT (ELECTROSURGICAL) ×1 IMPLANT
GAUZE PETRO XEROFOAM 1X8 (MISCELLANEOUS) ×3 IMPLANT
GAUZE SPONGE 4X4 12PLY STRL (GAUZE/BANDAGES/DRESSINGS) ×3 IMPLANT
GAUZE XEROFORM 4X4 STRL (GAUZE/BANDAGES/DRESSINGS) ×3 IMPLANT
GLOVE BIO SURGEON STRL SZ8 (GLOVE) ×6 IMPLANT
GLOVE INDICATOR 8.0 STRL GRN (GLOVE) ×3 IMPLANT
GOWN STRL REUS W/ TWL LRG LVL3 (GOWN DISPOSABLE) ×1 IMPLANT
GOWN STRL REUS W/ TWL XL LVL3 (GOWN DISPOSABLE) ×1 IMPLANT
GOWN STRL REUS W/TWL LRG LVL3 (GOWN DISPOSABLE) ×2
GOWN STRL REUS W/TWL XL LVL3 (GOWN DISPOSABLE) ×2
HANDLE YANKAUER SUCT BULB TIP (MISCELLANEOUS) ×3 IMPLANT
HEMOVAC 400ML (MISCELLANEOUS) ×3
KIT DRAIN HEMOVAC JP 7FR 400ML (MISCELLANEOUS) ×1 IMPLANT
KIT RM TURNOVER STRD PROC AR (KITS) ×3 IMPLANT
LABEL OR SOLS (LABEL) ×3 IMPLANT
NS IRRIG 1000ML POUR BTL (IV SOLUTION) ×3 IMPLANT
PACK EXTREMITY ARMC (MISCELLANEOUS) ×3 IMPLANT
PAD ABD DERMACEA PRESS 5X9 (GAUZE/BANDAGES/DRESSINGS) ×3 IMPLANT
PAD CAST CTTN 4X4 STRL (SOFTGOODS) ×2 IMPLANT
PAD PREP 24X41 OB/GYN DISP (PERSONAL CARE ITEMS) ×3 IMPLANT
PADDING CAST COTTON 4X4 STRL (SOFTGOODS) ×4
PLATE LOCK 7H 92 BILAT FIB (Plate) ×3 IMPLANT
SCREW CORTICAL 3.5MM 18MM (Screw) ×6 IMPLANT
SCREW LOCK CORT STAR 3.5X10 (Screw) ×3 IMPLANT
SCREW LOCK CORT STAR 3.5X12 (Screw) ×3 IMPLANT
SCREW LOCK CORT STAR 3.5X14 (Screw) ×3 IMPLANT
SCREW LOW PROFILE 18MMX3.5MM (Screw) ×3 IMPLANT
SCREW NON LOCKING LP 3.5 14MM (Screw) ×9 IMPLANT
SPONGE LAP 18X18 5 PK (GAUZE/BANDAGES/DRESSINGS) ×3 IMPLANT
STAPLER SKIN PROX 35W (STAPLE) ×3 IMPLANT
STOCKINETTE IMPERV 14X48 (MISCELLANEOUS) ×3 IMPLANT
SUT VIC AB 0 CT1 36 (SUTURE) ×3 IMPLANT
SUT VIC AB 2-0 SH 27 (SUTURE) ×4
SUT VIC AB 2-0 SH 27XBRD (SUTURE) ×2 IMPLANT
SYR 10ML LL (SYRINGE) ×3 IMPLANT

## 2017-03-26 NOTE — ED Notes (Signed)
MD at bedside. 

## 2017-03-26 NOTE — ED Triage Notes (Signed)
Fell off 1 foot step stool. Did not hit head.  Swelling and pain to left ankle.

## 2017-03-26 NOTE — H&P (Signed)
Subjective:  Chief complaint: Left ankle injury.  The patient is a 66 y.o. female with a history of hypertension, insulin-dependent diabetes, COPD, hyperlipidemia, depression, and obesity who lives independently.  She was in her usual state of health when she sustained an injury to the left ankle earlier today. She apparently lost her balance and fell off of a short step stool while trying to put some dishes away in her kitchen. Initially, she tried to walk on it and was able to get herself up stairs. However, her pain worsened and she was no longer able to bear weight on the ankle. She presented to the emergency room where x-rays demonstrated a fracture dislocation of her left ankle. An attempt was made by the ER provider to reduce the ankle and the ankle was splinted. The patient denies any associated injury or loss of consciousness associated with the injury, and denies any light-headedness, loss of consciousness, chest pain, or shortness of breath which might have contributed to the injury.  Patient Active Problem List   Diagnosis Date Noted  . Acute respiratory failure with hypoxia ([place]) 08/05/2015   Past Medical History:  Diagnosis Date  . COPD (chronic obstructive pulmonary disease) ([place])   . Depression   . Diabetes mellitus without complication ([place])   . Hyperlipemia   . Hypertension   . Obesity     Past Surgical History:  Procedure Laterality Date  . BLADDER SURGERY    . COLONOSCOPY WITH PROPOFOL N/A 08/05/2015   Procedure: COLONOSCOPY WITH PROPOFOL;  Surgeon: Josefine Class, MD;  Location: [facility] ENDOSCOPY;  Service: Endoscopy;  Laterality: N/A;  . FOOT SURGERY Left     Medications Prior to Admission  Medication Sig Dispense Refill Last Dose  . amLODipine (NORVASC) 10 MG tablet Take 10 mg by mouth daily.   03/25/2017 at 2000  . aspirin EC 81 MG tablet Take 81 mg by mouth daily.   03/25/2017 at 2000  . atenolol (TENORMIN) 25 MG tablet Take 25 mg by mouth daily.   03/25/2017 at  2000  . atorvastatin (LIPITOR) 80 MG tablet Take 80 mg by mouth daily.   03/25/2017 at 2000  . buPROPion (WELLBUTRIN XL) 300 MG 24 hr tablet Take 300 mg by mouth daily.   03/25/2017 at 2000  . cholecalciferol (VITAMIN D) 400 units TABS tablet Take 400 Units by mouth daily.   03/25/2017 at 2000  . insulin aspart protamine- aspart (NOVOLOG MIX 70/30) (70-30) 100 UNIT/ML injection Inject 13 Units into the skin 3 (three) times daily with meals.   03/26/2017 at 0730  . insulin glargine (LANTUS) 100 UNIT/ML injection Inject 25 Units into the skin 2 (two) times daily.   03/26/2017 at 0730  . losartan-hydrochlorothiazide (HYZAAR) 100-25 MG tablet Take 1 tablet by mouth daily.   03/25/2017 at 2000  . metFORMIN (GLUCOPHAGE) 500 MG tablet Take 1,000 mg by mouth 2 (two) times daily with a meal.   03/25/2017 at 2000  . montelukast (SINGULAIR) 10 MG tablet Take 10 mg by mouth at bedtime.   03/25/2017 at 2000  . albuterol (PROVENTIL HFA;VENTOLIN HFA) 108 (90 Base) MCG/ACT inhaler Inhale 2 puffs into the lungs every 6 (six) hours as needed for wheezing or shortness of breath.   prn at prn   No Known Allergies  Social History   Tobacco Use  . Smoking status: Former Smoker    Packs/day: 1.00    Years: 40.00    Pack years: 40.00    Types: Cigarettes  . Smokeless  tobacco: Never Used  Substance Use Topics  . Alcohol use: No    Family History  Problem Relation Age of Onset  . CVA Mother   . Diabetes Mother   . CAD Father      Review of Systems: As noted above. The patient denies any chest pain, shortness of breath, nausea, vomiting, diarrhea, constipation, belly pain, blood in his/her stool, or burning with urination.  Objective: Temp:  [98 F (36.7 C)] 98 F (36.7 C) (11/27 1236) Pulse Rate:  [73-79] 79 (11/27 1609) Resp:  [13-18] 13 (11/27 1609) BP: (101-190)/(73-75) 101/75 (11/27 1609) SpO2:  [92 %-97 %] 97 % (11/27 1609) Weight:  [99.8 kg (220 lb)] 99.8 kg (220 lb) (11/27 1230)  Physical  Exam: General:  Alert, no acute distress Psychiatric:  Patient is competent for consent with normal mood and affect Cardiovascular:  RRR  Respiratory:  Clear to auscultation. No wheezing. Non-labored breathing GI:  Abdomen is soft and non-tender Skin:  No lesions in the area of chief complaint Neurologic:  Sensation intact distally Lymphatic:  No axillary or cervical lymphadenopathy  Orthopedic Exam:  Examination is limited to the left foot and lower leg. The leg is in a posterior splint and the ankle appears to be reasonably well aligned. Skin is intact at the proximal distal ends of the splint. She is able to dorsiflex and plantarflex her toes. Sensation is intact to light touch to her toes. She has good capillary refill to all digits.  Imaging Review: Recent x-rays of the left ankle are available for review. These films demonstrate an oblique fracture of the distal fibula with lateral displacement of the talus from beneath the distal tibia. The medial and posterior portions of the distal tibia appear to be intact. No lytic lesions or significant degenerative changes are identified..  Assessment: Closed displaced fracture dislocation, left ankle.  Plan: The treatment options, including both surgical and nonsurgical choices, have been discussed in detail with the patient.  The patient would like to proceed with surgical intervention to include an open reduction and internal fixation of the displaced left ankle fracture.  The risks (including bleeding, infection, nerve and/or blood vessel injury, persistent or recurrent pain, loosening or failure of the components, leg length inequality, dislocation, need for further surgery, blood clots, strokes, heart attacks or arrhythmias, pneumonia, etc.) and benefits of the surgical procedure were discussed. The patient states her understanding and agrees to proceed. A formal written consent has been obtained.

## 2017-03-26 NOTE — ED Notes (Signed)
See triage note  States she stepped of step  Twisted left ankle  Min swelling noted  No deformity noted positive pulses

## 2017-03-26 NOTE — ED Provider Notes (Signed)
Reduction of dislocation Date/Time: 03/26/2017 3:00 PM Performed by: Lavonia Drafts, MD Authorized by: Lavonia Drafts, MD  Consent: Verbal consent obtained. Written consent not obtained. Consent given by: patient Patient understanding: patient states understanding of the procedure being performed Patient consent: the patient's understanding of the procedure matches consent given Imaging studies: imaging studies available Local anesthesia used: no  Anesthesia: Local anesthesia used: no  Sedation: Patient sedated: no  Patient tolerance: Patient tolerated the procedure well with no immediate complications       Lavonia Drafts, MD 03/26/17 1502

## 2017-03-26 NOTE — Anesthesia Procedure Notes (Signed)
Anesthesia Regional Block: Popliteal block   Pre-Anesthetic Checklist: ,, timeout performed, Correct Patient, Correct Site, Correct Laterality, Correct Procedure, Correct Position, site marked, Risks and benefits discussed,  Surgical consent,  Pre-op evaluation,  At surgeon's request and post-op pain management  Laterality: Lower and Left  Prep: chloraprep       Needles:  Injection technique: Single-shot  Needle Type: Echogenic Needle     Needle Length: 9cm  Needle Gauge: 21     Additional Needles:   Procedures:,,,, ultrasound used (permanent image in chart),,,,  Narrative:  Start time: 03/26/2017 4:18 PM End time: 03/26/2017 4:21 PM Injection made incrementally with aspirations every 5 mL.  Performed by: Personally  Anesthesiologist: Piscitello, Precious Haws, MD  Additional Notes: Patient has injury to left ankle, she reports pain and weakness in that leg.  She is not sure if she has any numbness in that leg or foot  Functioning IV was confirmed and monitors were applied.  A echogenic needle was used. Sterile prep,hand hygiene and sterile gloves were used. Minimal sedation used for procedure.   No paresthesia endorsed by patient during the procedure.  Negative aspiration and negative test dose prior to incremental administration of local anesthetic. The patient tolerated the procedure well with no immediate complications.

## 2017-03-26 NOTE — Consult Note (Signed)
[place] [facility] [place] NAME: Terri [name]    MR#:  [id number]  DATE OF BIRTH:  1950-07-06  DATE OF CONSULT:  03/26/2017  PRIMARY CARE PHYSICIAN: Idelle Crouch, MD   REQUESTING/REFERRING PHYSICIAN: Dr. Lavonia Drafts   CHIEF COMPLAINT:   Chief Complaint  Patient presents with  . Ankle Pain  s/p fall   HISTORY OF PRESENT ILLNESS:  Terri [name]  is a 66 y.o. female with a known history of COPD, diabetes, hypertension, hyperlipidemia, depression who presents to the hospital after a mechanical fall and noted to have a left ankle fracture. Patient says she was using a step stool in the kitchen to grab something when she lost her footing and fell to the ground. She attempted to walk but was having significant pain in her left ankle/foot. She crawled upstairs and called her primary care physician who told her to come to the walk-in clinic. Patient although was having significant pain and could not bear any weight and therefore called EMS and she was brought to the hospital. In the emergency room patient's x-ray was suggestive of a left ankle fracture. Hospitalist services were contacted for preoperative medical evaluation. Patient denies any prodromal symptoms prior to her fall like chest pain, shortness of breath, palpitations or any syncope.  PAST MEDICAL HISTORY:   Past Medical History:  Diagnosis Date  . COPD (chronic obstructive pulmonary disease) ([place])   . Depression   . Diabetes mellitus without complication ([place])   . Hyperlipemia   . Hypertension   . Obesity     PAST SURGICAL HISTOIRY:   Past Surgical History:  Procedure Laterality Date  . BLADDER SURGERY    . COLONOSCOPY WITH PROPOFOL N/A 08/05/2015   Procedure: COLONOSCOPY WITH PROPOFOL;  Surgeon: Josefine Class, MD;  Location: [facility] ENDOSCOPY;  Service: Endoscopy;  Laterality: N/A;  . FOOT SURGERY Left     SOCIAL HISTORY:   Social History   Tobacco Use  . Smoking status:  Former Smoker    Packs/day: 1.00    Years: 40.00    Pack years: 40.00    Types: Cigarettes  . Smokeless tobacco: Never Used  Substance Use Topics  . Alcohol use: No    FAMILY HISTORY:   Family History  Problem Relation Age of Onset  . CVA Mother   . Diabetes Mother   . CAD Father     DRUG ALLERGIES:  No Known Allergies  REVIEW OF SYSTEMS:   Review of Systems  Constitutional: Negative for fever and weight loss.  HENT: Negative for congestion, nosebleeds and tinnitus.   Eyes: Negative for blurred vision, double vision and redness.  Respiratory: Negative for cough, hemoptysis and shortness of breath.   Cardiovascular: Negative for chest pain, orthopnea, leg swelling and PND.  Gastrointestinal: Negative for abdominal pain, diarrhea, melena, nausea and vomiting.  Genitourinary: Negative for dysuria, hematuria and urgency.  Musculoskeletal: Positive for falls and joint pain (left ankle).  Neurological: Negative for dizziness, tingling, sensory change, focal weakness, seizures, weakness and headaches.  Endo/Heme/Allergies: Negative for polydipsia. Does not bruise/bleed easily.  Psychiatric/Behavioral: Negative for depression and memory loss. The patient is not nervous/anxious.      MEDICATIONS AT HOME:   Prior to Admission medications   Medication Sig Start Date End Date Taking? Authorizing Provider  amLODipine (NORVASC) 10 MG tablet Take 10 mg by mouth daily.   Yes [provider]  aspirin EC 81 MG tablet Take 81 mg by mouth daily.  Yes [provider]  atenolol (TENORMIN) 25 MG tablet Take 25 mg by mouth daily.   Yes [provider]  atorvastatin (LIPITOR) 80 MG tablet Take 80 mg by mouth daily.   Yes [provider]  buPROPion (WELLBUTRIN XL) 300 MG 24 hr tablet Take 300 mg by mouth daily.   Yes [provider]  cholecalciferol (VITAMIN D) 400 units TABS tablet Take 400 Units by mouth daily.   Yes [provider]   insulin aspart protamine- aspart (NOVOLOG MIX 70/30) (70-30) 100 UNIT/ML injection Inject 13 Units into the skin 3 (three) times daily with meals.   Yes [provider]  insulin glargine (LANTUS) 100 UNIT/ML injection Inject 25 Units into the skin 2 (two) times daily.   Yes [provider]  losartan-hydrochlorothiazide (HYZAAR) 100-25 MG tablet Take 1 tablet by mouth daily.   Yes [provider]  metFORMIN (GLUCOPHAGE) 500 MG tablet Take 1,000 mg by mouth 2 (two) times daily with a meal.   Yes [provider]  montelukast (SINGULAIR) 10 MG tablet Take 10 mg by mouth at bedtime.   Yes [provider]  albuterol (PROVENTIL HFA;VENTOLIN HFA) 108 (90 Base) MCG/ACT inhaler Inhale 2 puffs into the lungs every 6 (six) hours as needed for wheezing or shortness of breath.    [provider]      VITAL SIGNS:  Blood pressure (!) 170/73, pulse 73, temperature 98 F (36.7 C), temperature source Oral, resp. rate 14, height 5\' 3"  (1.6 m), weight 99.8 kg (220 lb), SpO2 92 %.  PHYSICAL EXAMINATION:  GENERAL:  66 y.o.-year-old patient lying in the bed in no acute distress.  EYES: Pupils equal, round, reactive to light and accommodation. No scleral icterus. Extraocular muscles intact.  HEENT: Head atraumatic, normocephalic. Oropharynx and nasopharynx clear.  NECK:  Supple, no jugular venous distention. No thyroid enlargement, no tenderness.  LUNGS: Normal breath sounds bilaterally, no wheezing, rales, rhonchi . No use of accessory muscles of respiration.  CARDIOVASCULAR: S1, S2, RRR. No murmurs, rubs, gallops, clicks.  ABDOMEN: Soft, nontender, nondistended. Bowel sounds present. No organomegaly or mass.  EXTREMITIES: No pedal edema, cyanosis, or clubbing. Left foot/ankle in a cast.  NEUROLOGIC: Cranial nerves II through XII are intact. No focal motor or sensory deficits appreciated bilaterally  PSYCHIATRIC: The patient is alert and oriented x 3.  SKIN:  No obvious rash, lesion, or ulcer.   LABORATORY PANEL:   CBC Recent Labs  Lab 03/26/17 1434  WBC 13.2*  HGB 13.5  HCT 40.8  PLT 226   ------------------------------------------------------------------------------------------------------------------  Chemistries  Recent Labs  Lab 03/26/17 1434  NA 138  K 3.8  CL 99*  CO2 28  GLUCOSE 122*  BUN 26*  CREATININE 0.86  CALCIUM 9.5   ------------------------------------------------------------------------------------------------------------------  Cardiac Enzymes No results for input(s): TROPONINI in the last 168 hours. ------------------------------------------------------------------------------------------------------------------  RADIOLOGY:  Dg Ankle Complete Left  Result Date: 03/26/2017 CLINICAL DATA:  [place] from 1 foot high stool today, medial LEFT ankle pain, bruising and swelling, history hypertension, diabetes mellitus EXAM: LEFT ANKLE COMPLETE - 3+ VIEW COMPARISON:  None FINDINGS: Osseous demineralization. Oblique mildly displaced fracture of the distal LEFT fibula. Marked lateral subluxation of the talus with marked widening of the medial ankle joint. Probable posterior malleolar avulsion fracture. Small elongated calcification is seen medial to the talus, appears corticated, uncertain if represents an ossicle or a small avulsion fragment. No additional fracture, dislocation, or bone destruction. Bulky plantar calcaneal spur. Additional probable spurring at dorsal  margin of distal talus. IMPRESSION: Displaced distal LEFT fibular fracture. Probable posterior malleolar avulsion fracture. Lateral subluxation of the talus with marked widening of the medial ankle joint line. Questionable ossicle versus small avulsion fragment medial to the talus. Electronically Signed   By: Lavonia Dana M.D.   On: 03/26/2017 13:57     IMPRESSION AND PLAN:   66 year old female with past medical history of COPD, diabetes, hypertension,  hyperlipidemia, depression who presented to the hospital after a mechanical fall and noted to have a left ankle fracture.  1. Preoperative medical evaluation-patient is a low risk for noncardiac surgery. -Preoperative EKG has been reviewed and shows no acute ST or T-wave changes. Patient has no absolute contraindications to surgery at this time. -Continue preoperative beta blocker atenolol.  2. Status post fall with left ankle fracture-orthopedics has been consulted. Patient likely to go to the operating room later today. Continue further care as per orthopedics.  3. Diabetes type 2 without complication-hold patient's scheduled insulin as patient is going to be nothing by mouth presently. Continue sliding scale insulin for now. Hold metformin. These can likely be resumed tomorrow.  4. Essential hypertension-continue atenolol, Norvasc, losartan/HCTZ.  5. Hyperlipidemia-continue atorvastatin.  6. Depression-continue Wellbutrin.  Thanks for the consult and will follow with you.   All the records are reviewed and case discussed with Consulting provider. Management plans discussed with the patient, family and they are in agreement.  CODE STATUS: Full code  TOTAL TIME TAKING CARE OF THIS PATIENT: 40 minutes.    Henreitta Leber M.D on 03/26/2017 at 3:43 PM  Between 7am to 6pm - Pager - [phone]  After 6pm go to [email or web address] - password [email or web address] [facility]  Office  [phone]  CC: Primary care Physician: Idelle Crouch, MD

## 2017-03-26 NOTE — Op Note (Signed)
03/26/2017  6:21 PM  Patient:   Terri [name]  Pre-Op Diagnosis:   Closed displaced fracture dislocation, left ankle.  Post-Op Diagnosis:   Same.  Procedure:   Open reduction and internal fixation of displaced fibular fracture, left ankle.  Surgeon:   Pascal Lux, MD  Assistant:   None  Anesthesia:   GET with popliteal block  Findings:   As above.  Complications:   None  EBL:   5 cc  Fluids:   800 cc crystalloid  UOP:   None  TT:   63 min at 300 mmHg  Drains:   None  Closure:   Staples  Implants:   Biomet ALPS 7-hole composite locking plate and screws  Brief Clinical Note:   The patient is a 66 year old female with multiple medical problems and lives independently. Apparently, she fell from a footstool while trying to put some stuff away in her kitchen earlier today and injured her left ankle. X-rays in the emergency room demonstrated the above-noted fracture. The patient presents at this time for definitive management of her injury.  Procedure:   The patient underwent placement of a popliteal block by the anesthesiologist in the preoperative holding area before she was brought into the operating room and laid in the supine position. After adequate general endotracheal intubation and anesthesia was obtained, the patient's left foot and lower extremity were prepped with ChloraPrep solution, then draped sterilely. Preoperative antibiotics were administered. A timeout was performed to verify the appropriate surgical site before the limb was exsanguinated with an Esmarch and the thigh tourniquet inflated to 300 mmHg. A 7-8 cm incision was made over the lateral aspect of the distal fibula. The incision was carried down through the subcutaneous tissues to expose the fracture site. The fracture hematoma was debrided before the fracture was reduced and temporarily secured using a bone clamp. Two lag screws were placed in an anterior to posterior direction perpendicular to the  fracture. A 7-hole Biomet ALPS composite locking plate was applied over the lateral aspect of the distal fibula. After verifying its position fluoroscopically, it was secured using a 3.5 mm nonlocking cortical screw proximal to the fracture. Again the plate's position was adjusted slightly based on AP and lateral projections before it was secured using additional bicortical screws proximally and multiple locking screws distally. The adequacy of fracture reduction, mortise restoration, and hardware position was verified fluoroscopically in AP and lateral projections and found to be excellent. One of the more proximal bicortical screws was removed and replaced with a shorter screw as it appeared to be too long.  The wound was copiously irrigated with sterile saline solution. The subcutaneous tissues were closed in two layers using #0 and 2-0 Vicryl interrupted sutures before the skin was closed using staples. A total of 20 cc of 0.5% plain Sensorcaine was injected in and around the incision site to help with postoperative analgesia. A sterile bulky dressing was applied to the wound before the patient was placed into a posterior splint with a sugar tong supplement, maintaining the ankle in neutral dorsiflexion. The patient was then awakened, extubated, and returned to the recovery room in satisfactory condition after tolerating the procedure well.

## 2017-03-26 NOTE — Anesthesia Procedure Notes (Signed)
Procedure Name: Intubation Date/Time: 03/26/2017 4:35 PM Performed by: Hedda Slade, CRNA Pre-anesthesia Checklist: Patient identified, Patient being monitored, Timeout performed, Emergency Drugs available and Suction available Patient Re-evaluated:Patient Re-evaluated prior to induction Oxygen Delivery Method: Circle system utilized Preoxygenation: Pre-oxygenation with 100% oxygen Induction Type: IV induction Ventilation: Mask ventilation without difficulty Laryngoscope Size: Mac and 3 Grade View: Grade I Tube type: Oral Tube size: 7.0 mm Number of attempts: 1 Airway Equipment and Method: Stylet Placement Confirmation: ETT inserted through vocal cords under direct vision,  positive ETCO2 and breath sounds checked- equal and bilateral Secured at: 21 cm Tube secured with: Tape Dental Injury: Teeth and Oropharynx as per pre-operative assessment

## 2017-03-26 NOTE — Anesthesia Post-op Follow-up Note (Signed)
Anesthesia QCDR form completed.        

## 2017-03-26 NOTE — ED Triage Notes (Signed)
FIRST NURSE NOTE-fell 1 foot off step stool. Foot/ankle pain. Came EMS. VSS per report

## 2017-03-26 NOTE — ED Provider Notes (Signed)
[facility] Emergency Department Provider Note  ____________________________________________   First MD Initiated Contact with Patient 03/26/17 1326     (approximate)  I have reviewed the triage vital signs and the nursing notes.   HISTORY  Chief Complaint Ankle Pain    HPI Terri [name] is a 66 y.o. female complains of left ankle pain, states she was on a plastic step stooland the plastic gave way and she fell, still was only one step high, she states it hurts to bear weight on the left ankle, denies any other injury, denies headache, denies neck pain, denies arm or hip pain, states she is unable to bear weight on the left ankle, happened this morning   Past Medical History:  Diagnosis Date  . COPD (chronic obstructive pulmonary disease) ([place])   . Depression   . Diabetes mellitus without complication ([place])   . Hyperlipemia   . Hypertension   . Obesity     Patient Active Problem List   Diagnosis Date Noted  . Acute respiratory failure with hypoxia ([place]) 08/05/2015    Past Surgical History:  Procedure Laterality Date  . BLADDER SURGERY    . COLONOSCOPY WITH PROPOFOL N/A 08/05/2015   Procedure: COLONOSCOPY WITH PROPOFOL;  Surgeon: Josefine Class, MD;  Location: [facility] ENDOSCOPY;  Service: Endoscopy;  Laterality: N/A;  . FOOT SURGERY Left     Prior to Admission medications   Medication Sig Start Date End Date Taking? Authorizing Provider  albuterol (PROVENTIL HFA;VENTOLIN HFA) 108 (90 Base) MCG/ACT inhaler Inhale 2 puffs into the lungs every 6 (six) hours as needed for wheezing or shortness of breath.    [provider]  amLODipine (NORVASC) 10 MG tablet Take 10 mg by mouth daily.    [provider]  aspirin EC 81 MG tablet Take 81 mg by mouth daily.    [provider]  atenolol (TENORMIN) 25 MG tablet Take 25 mg by mouth daily.    [provider]  atorvastatin (LIPITOR) 80 MG tablet Take 80 mg by mouth  daily.    [provider]  buPROPion (WELLBUTRIN XL) 300 MG 24 hr tablet Take 300 mg by mouth daily.    [provider]  insulin glargine (LANTUS) 100 UNIT/ML injection Inject 25 Units into the skin 2 (two) times daily.    [provider]  insulin lispro (HUMALOG) 100 UNIT/ML injection Inject 13 Units into the skin 3 (three) times daily before meals.    [provider]  losartan-hydrochlorothiazide (HYZAAR) 100-25 MG tablet Take 1 tablet by mouth daily.    [provider]  metFORMIN (GLUCOPHAGE) 500 MG tablet Take 1,000 mg by mouth 2 (two) times daily with a meal.    [provider]  montelukast (SINGULAIR) 10 MG tablet Take 10 mg by mouth at bedtime.    [provider]    Allergies Patient has no known allergies.  Family History  Problem Relation Age of Onset  . CVA Mother   . Diabetes Mother   . CAD Father     Social History Social History   Tobacco Use  . Smoking status: Former Research scientist (life sciences)  . Smokeless tobacco: Never Used  Substance Use Topics  . Alcohol use: No  . Drug use: No    Review of Systems  Constitutional: No fever/chills Eyes: No visual changes. ENT: No sore throat. Respiratory: Denies cough Genitourinary: Negative for dysuria. Musculoskeletal: Negative for back pain. Positive left ankle pain Skin: Negative for rash.  ____________________________________________   PHYSICAL EXAM:  VITAL SIGNS: ED Triage Vitals  Enc Vitals Group     BP 03/26/17 1236 (!) 190/74     Pulse Rate 03/26/17 1236 78     Resp 03/26/17 1236 18     Temp 03/26/17 1236 98 F (36.7 C)     Temp Source 03/26/17 1236 Oral     SpO2 03/26/17 1236 96 %     Weight 03/26/17 1230 220 lb (99.8 kg)     Height 03/26/17 1230 5\' 3"  (1.6 m)     Head Circumference --      Peak Flow --      Pain Score 03/26/17 1230 7     Pain Loc --      Pain Edu? --      Excl. in [place]? --     Constitutional: Alert and oriented. Well appearing  and in no acute distress. Eyes: Conjunctivae are normal.  Head: Atraumatic. Nose: No congestion/rhinnorhea. Mouth/Throat: Mucous membranes are moist.   Cardiovascular: Normal rate, regular rhythm. Respiratory: Normal respiratory effort.  No retractions GU: deferred Musculoskeletal: FROM all extremities, warm and well perfused, left ankle is grossly swollen, small deformity noted, neurovascular is intact Neurologic:  Normal speech and language.  Skin:  Skin is warm, dry and intact. No rash noted. Psychiatric: Mood and affect are normal. Speech and behavior are normal.  ____________________________________________   LABS (all labs ordered are listed, but only abnormal results are displayed)  Labs Reviewed  BASIC METABOLIC PANEL - Abnormal; Notable for the following components:      Result Value   Chloride 99 (*)    Glucose, Bld 122 (*)    BUN 26 (*)    All other components within normal limits  CBC WITH DIFFERENTIAL/PLATELET - Abnormal; Notable for the following components:   WBC 13.2 (*)    MCV 79.9 (*)    RDW 14.7 (*)    Neutro Abs 11.1 (*)    All other components within normal limits  PROTIME-INR  APTT   ____________________________________________   ____________________________________________  RADIOLOGY  X-ray of the left ankle shows a displaced mortise, displaced fracture of the fibula  ____________________________________________   PROCEDURES  Procedure(s) performed: Dr. Corky Downs in to help reduce the ankle, splint was applied, and foot was elevated      ____________________________________________   INITIAL IMPRESSION / ASSESSMENT AND PLAN / ED COURSE  Pertinent labs & imaging results that were available during my care of the patient were reviewed by me and considered in my medical decision making (see chart for details).  Patient is a 66 year old female that was on a small step stool that gave way and she twisted her ankle, the ankle is swollen and  tender, it appears the ankle actually may be broken versus sprain, x-ray of the left ankle was ordered, the patient will need a walker as she  uses a cane for the right side    ----------------------------------------- 3:19 PM on 03/26/2017 -----------------------------------------  On x-ray the patient has a displaced fracture of the left ankle with mortise displacement, the ankle was reduced by Dr. Corky Downs, splint was applied, all of the x-rays were discussed with Dr.Poggi, he will take the patient to the operating room today, patient is to be admitted by the hospitalist   ____________________________________________   FINAL CLINICAL IMPRESSION(S) / ED DIAGNOSES  Final diagnoses:  Closed fracture of left ankle, initial encounter      NEW MEDICATIONS STARTED DURING THIS VISIT:  This SmartLink is  deprecated. Use AVSMEDLIST instead to display the medication list for a patient.   Note:  This document was prepared using Dragon voice recognition software and may include unintentional dictation errors.    Versie Starks, PA-C 03/26/17 Delcie Roch    Lavonia Drafts, MD 03/26/17 1524

## 2017-03-26 NOTE — Anesthesia Preprocedure Evaluation (Signed)
Anesthesia Evaluation  Patient identified by MRN, date of birth, ID band Patient awake    Reviewed: Allergy & Precautions, H&P , NPO status , Patient's Chart, lab work & pertinent test results  History of Anesthesia Complications Negative for: history of anesthetic complications  Airway Mallampati: III  TM Distance: <3 FB Neck ROM: limited    Dental  (+) Chipped, Poor Dentition, Missing, Edentulous Upper   Pulmonary neg shortness of breath, sleep apnea , COPD, former smoker,           Cardiovascular Exercise Tolerance: Good hypertension, (-) angina(-) Past MI and (-) DOE      Neuro/Psych PSYCHIATRIC DISORDERS Depression negative neurological ROS     GI/Hepatic negative GI ROS, Neg liver ROS,   Endo/Other  diabetes, Type 2  Renal/GU      Musculoskeletal   Abdominal   Peds  Hematology negative hematology ROS (+)   Anesthesia Other Findings Past Medical History: No date: COPD (chronic obstructive pulmonary disease) ([facility]) No date: Depression No date: Diabetes mellitus without complication ([facility]) No date: Hyperlipemia No date: Hypertension No date: Obesity  Past Surgical History: No date: BLADDER SURGERY 08/05/2015: COLONOSCOPY WITH PROPOFOL; N/A     Comment:  Procedure: COLONOSCOPY WITH PROPOFOL;  Surgeon: Josefine Class, MD;  Location[facility] ENDOSCOPY;  Service:               Endoscopy;  Laterality: N/A; No date: FOOT SURGERY; Left  BMI    Body Mass Index:  38.97 kg/m      Reproductive/Obstetrics negative OB ROS                             Anesthesia Physical Anesthesia Plan  ASA: III  Anesthesia Plan: General ETT   Post-op Pain Management: GA combined w/ Regional for post-op pain   Induction: Intravenous  PONV Risk Score and Plan: 3  Airway Management Planned: Oral ETT  Additional Equipment:   Intra-op Plan:   Post-operative Plan: Extubation in  OR  Informed Consent: I have reviewed the patients History and Physical, chart, labs and discussed the procedure including the risks, benefits and alternatives for the proposed anesthesia with the patient or authorized representative who has indicated his/her understanding and acceptance.   Dental Advisory Given  Plan Discussed with: Anesthesiologist, CRNA and Surgeon  Anesthesia Plan Comments: (Patient consented for risks of anesthesia including but not limited to:  - adverse reactions to medications - damage to teeth, lips or other oral mucosa - sore throat or hoarseness - Damage to heart, brain, lungs or loss of life  Patient voiced understanding.)        Anesthesia Quick Evaluation

## 2017-03-26 NOTE — Anesthesia Postprocedure Evaluation (Signed)
Anesthesia Post Note  Patient: Terri [name]  Procedure(s) Performed: OPEN REDUCTION INTERNAL FIXATION (ORIF) ANKLE FRACTURE (Left Ankle)  Patient location during evaluation: PACU Anesthesia Type: General Level of consciousness: awake and alert Pain management: pain level controlled Vital Signs Assessment: post-procedure vital signs reviewed and stable Respiratory status: spontaneous breathing, nonlabored ventilation, respiratory function stable and patient connected to nasal cannula oxygen Cardiovascular status: blood pressure returned to baseline and stable Postop Assessment: no apparent nausea or vomiting Anesthetic complications: no     Last Vitals:  Vitals:   03/26/17 2100 03/26/17 2241  BP: (!) 158/68 (!) 162/90  Pulse: 90 95  Resp: 18 16  Temp: 37.1 C 36.8 C  SpO2: 91% 93%    Last Pain:  Vitals:   03/26/17 2241  TempSrc: Oral  PainSc:                  [name]

## 2017-03-26 NOTE — Transfer of Care (Signed)
Immediate Anesthesia Transfer of Care Note  Patient: Terri [name]  Procedure(s) Performed: OPEN REDUCTION INTERNAL FIXATION (ORIF) ANKLE FRACTURE (Left Ankle)  Patient Location: PACU  Anesthesia Type:General  Level of Consciousness: awake, alert  and oriented  Airway & Oxygen Therapy: Patient Spontanous Breathing and Patient connected to face mask oxygen  Post-op Assessment: Report given to RN and Post -op Vital signs reviewed and stable  Post vital signs: Reviewed and stable  Last Vitals:  Vitals:   03/26/17 1629 03/26/17 1817  BP:  (!) 166/88  Pulse: 79 84  Resp: 17 17  Temp:  36.7 C  SpO2: 100%     Last Pain:  Vitals:   03/26/17 1817  TempSrc: Temporal  PainSc:          Complications: No apparent anesthesia complications

## 2017-03-26 NOTE — ED Provider Notes (Signed)
Saw this patient in conjunction with PA Ashok Cordia, ankle fracture with subluxation of the talus.  I attempted reduction of this and splinted the ankle.  Patient tolerated this quite well.  Patient will be admitted to the hospital service per Dr. Nicholaus Bloom request   ED ECG REPORT I, Lavonia Drafts, the attending physician, personally viewed and interpreted this ECG.  Date: 03/26/2017  Rhythm: normal sinus rhythm QRS Axis: Left axis deviation Intervals: normal ST/T Wave abnormalities: normal     Lavonia Drafts, MD 03/26/17 1503

## 2017-03-27 ENCOUNTER — Encounter: Payer: Self-pay | Admitting: Surgery

## 2017-03-27 DIAGNOSIS — E119 Type 2 diabetes mellitus without complications: Secondary | ICD-10-CM | POA: Diagnosis not present

## 2017-03-27 DIAGNOSIS — M25572 Pain in left ankle and joints of left foot: Secondary | ICD-10-CM | POA: Diagnosis not present

## 2017-03-27 DIAGNOSIS — I10 Essential (primary) hypertension: Secondary | ICD-10-CM | POA: Diagnosis not present

## 2017-03-27 DIAGNOSIS — Z7401 Bed confinement status: Secondary | ICD-10-CM | POA: Diagnosis not present

## 2017-03-27 DIAGNOSIS — Z781 Physical restraint status: Secondary | ICD-10-CM | POA: Diagnosis not present

## 2017-03-27 DIAGNOSIS — S82892A Other fracture of left lower leg, initial encounter for closed fracture: Secondary | ICD-10-CM | POA: Diagnosis not present

## 2017-03-27 LAB — CBC WITH DIFFERENTIAL/PLATELET
BASOS ABS: 0 10*3/uL (ref 0–0.1)
Basophils Relative: 0 %
EOS PCT: 0 %
Eosinophils Absolute: 0 10*3/uL (ref 0–0.7)
HCT: 38.2 % (ref 35.0–47.0)
HEMOGLOBIN: 12.5 g/dL (ref 12.0–16.0)
LYMPHS PCT: 6 %
Lymphs Abs: 0.6 10*3/uL — ABNORMAL LOW (ref 1.0–3.6)
MCH: 26.4 pg (ref 26.0–34.0)
MCHC: 32.9 g/dL (ref 32.0–36.0)
MCV: 80.3 fL (ref 80.0–100.0)
Monocytes Absolute: 0.6 10*3/uL (ref 0.2–0.9)
Monocytes Relative: 6 %
NEUTROS ABS: 9.7 10*3/uL — AB (ref 1.4–6.5)
NEUTROS PCT: 88 %
PLATELETS: 222 10*3/uL (ref 150–440)
RBC: 4.75 MIL/uL (ref 3.80–5.20)
RDW: 14.8 % — ABNORMAL HIGH (ref 11.5–14.5)
WBC: 11 10*3/uL (ref 3.6–11.0)

## 2017-03-27 LAB — BASIC METABOLIC PANEL
Anion gap: 10 (ref 5–15)
BUN: 26 mg/dL — AB (ref 6–20)
CHLORIDE: 99 mmol/L — AB (ref 101–111)
CO2: 27 mmol/L (ref 22–32)
Calcium: 8.6 mg/dL — ABNORMAL LOW (ref 8.9–10.3)
Creatinine, Ser: 1.04 mg/dL — ABNORMAL HIGH (ref 0.44–1.00)
GFR calc Af Amer: >60 mL/min (ref >60)
GFR calc non Af Amer: 54 mL/min — ABNORMAL LOW (ref >60)
GLUCOSE: 219 mg/dL — AB (ref 65–99)
POTASSIUM: 4.3 mmol/L (ref 3.5–5.1)
Sodium: 136 mmol/L (ref 135–145)

## 2017-03-27 LAB — GLUCOSE, CAPILLARY: Glucose-Capillary: 174 mg/dL — ABNORMAL HIGH (ref 65–99)

## 2017-03-27 MED ORDER — ENOXAPARIN SODIUM 40 MG/0.4ML ~~LOC~~ SOLN: 40.0000 mg | SUBCUTANEOUS | 0 refills | Status: DC

## 2017-03-27 MED ORDER — OXYCODONE HCL 5 MG PO TABS: 5.0000 mg | ORAL_TABLET | ORAL | 0 refills | Status: DC | PRN

## 2017-03-27 NOTE — Progress Notes (Signed)
Pt alert and oriented. Resting in room. Worked with PT. EMS called for transfer home. IV removed. Will get pt ready for discharge now.

## 2017-03-27 NOTE — Evaluation (Signed)
Physical Therapy Evaluation Patient Details Name: Terri [name] MRN: [id number] DOB: 10/20/1950 Today's Date: 03/27/2017   History of Present Illness  Pt is a 66 y.o. female presenting s/p 1 foot fall off of step stool with L ankle pain.  Imaging showing oblique fx distal fibula with lateral displacement of talus from beneath distal tibia.  Pt s/p ORIF L ankle 03/26/17.  PMH includes COPD, depression, DM, and htn.  Clinical Impression  Prior to hospital admission, pt was modified independent ambulating with SPC.  Pt lives alone in 2 story condo (pt plans to sleep on main floor on couch; access to 1/2 bathroom on main floor).  Pt reports no one available to assist her in the home.  Currently pt is modified independent with bed mobility; min assist with transfers using RW; and min assist attempting to ambulate a few feet with RW (pt unsteady with difficulty maintaining NWB'ing status and also limited d/t fatigue).  Trialed pt with w/c and after initial cueing for use of brakes and technique pt able to propel w/c safely in hallway, around obstacles, and in tight space in room.  Pt able to perform stand pivot to/from w/c with CGA.  Pt would benefit from skilled PT to address noted impairments and functional limitations (see below for any additional details).  Upon hospital discharge, recommend pt discharge to STR (pt would benefit from consistent PT to improve safe functional mobility at home using RW).  If pt discharges home instead, a w/c will be pt's current safest way to mobilize in home to perform ADL's; pt would require assist into home d/t stairs.  CM notified of above.  Patient suffers from L ankle fx s/p ORIF ([place]) which impairs his/her ability to perform daily activities like toileting, dressing, bathing, meal preparation in the home. A walker will not resolve the patient's issue with performing activities of daily living. A wheelchair is required/recommended and will allow patient to safely  perform daily activities.  Patient can safely propel the wheelchair in the home.     Follow Up Recommendations SNF    Equipment Recommendations  Rolling walker with 5" wheels;3in1 (PT);Wheelchair (measurements PT);Wheelchair cushion (measurements PT)    Recommendations for Other Services OT consult     Precautions / Restrictions Precautions Precautions: Fall Precaution Comments: Elevate operative extremity Restrictions Weight Bearing Restrictions: Yes LLE Weight Bearing: Non weight bearing      Mobility  Bed Mobility Overal bed mobility: Modified Independent             General bed mobility comments: Supine to/from sit with mild increased effort but no assist required.  Transfers Overall transfer level: Needs assistance Equipment used: Rolling walker (2 wheeled) Transfers: Sit to/from [facility] Sit to Stand: Min assist Stand pivot transfers: Min guard       General transfer comment: x3 trials sit to/from stand from bed and 1 trial from recliner using RW requiring consistent vc's for UE and LE positioning and technique (required assist to initiate stand); stand pivot bed to w/c to recliner with CGA and increased time to perform (initial vc's for technique required and also locking of brakes)  Ambulation/Gait Ambulation/Gait assistance: Min assist Ambulation Distance (Feet): 3 Feet Assistive device: Rolling walker (2 wheeled)   Gait velocity: decreased   General Gait Details: decreased B step length and foot clearance; vc's for NWB'ing status; unsteady requiring assist to steady; limited distance d/t fatigue and limited ability to continue maintaining NWB'ing status  Stairs Stairs: (Deferred d/t difficulties with  ambulation and foot clearance from floor)          Wheelchair Mobility    Modified Rankin (Stroke Patients Only)       Balance Overall balance assessment: Needs assistance Sitting-balance support: No upper extremity  supported;Feet supported Sitting balance-Leahy Scale: Normal Sitting balance - Comments: sitting reaching outside BOS   Standing balance support: Bilateral upper extremity supported Standing balance-Leahy Scale: Poor Standing balance comment: requires B UE support for standing balance                             Pertinent Vitals/Pain Pain Assessment: 0-10 Pain Score: 3 (5/10 beginning of session; 3/10 end of session) Pain Descriptors / Indicators: Aching Pain Intervention(s): Limited activity within patient's tolerance;Monitored during session;Premedicated before session;Repositioned;Ice applied(L LE elevated on pillow)  Vitals (HR and O2 on room air) stable and WFL throughout treatment session.    Home Living Family/patient expects to be discharged to:: Private residence Living Arrangements: Alone   Type of Home: (2 story condo) Home Access: Stairs to enter Entrance Stairs-Rails: None Entrance Stairs-Number of Steps: 3 Home Layout: Two level;Able to live on main level with bedroom/bathroom Home Equipment: Kasandra Knudsen - single point Additional Comments: Pt reports plan to sleep on couch and has half bath on main floor (plans to do sponge baths).    Prior Function Level of Independence: Independent with assistive device(s)         Comments: Ambulates with SPC; denies any other falls in past 6 months; takes city [place] for transportation.  Reports no one available to assist pt at home.     Hand Dominance        Extremity/Trunk Assessment   Upper Extremity Assessment Upper Extremity Assessment: Generalized weakness    Lower Extremity Assessment Lower Extremity Assessment: (R LE WFL; L LE at least 3/5 AROM knee flexion/extension and hip flexion)    Cervical / Trunk Assessment Cervical / Trunk Assessment: Normal  Communication   Communication: No difficulties  Cognition Arousal/Alertness: Awake/alert Behavior During Therapy: Anxious Overall Cognitive Status:  Within Functional Limits for tasks assessed                                        General Comments General comments (skin integrity, edema, etc.): Pt resting in bed upon PT entry.  Nursing cleared pt for participation in physical therapy.  Pt agreeable to PT session.    Exercises  Transfer training; w/c management training   Assessment/Plan    PT Assessment Patient needs continued PT services  PT Problem List Decreased strength;Decreased activity tolerance;Decreased balance;Decreased mobility;Decreased knowledge of use of DME;Decreased knowledge of precautions;Pain       PT Treatment Interventions DME instruction;Gait training;Stair training;Functional mobility training;Therapeutic activities;Therapeutic exercise;Balance training;Patient/family education    PT Goals (Current goals can be found in the Care Plan section)  Acute Rehab PT Goals Patient Stated Goal: to go home and feed her cats PT Goal Formulation: With patient Time For Goal Achievement: 04/10/17 Potential to Achieve Goals: Fair    Frequency BID   Barriers to discharge Decreased caregiver support      Co-evaluation               AM-PAC PT "6 Clicks" Daily Activity  Outcome Measure Difficulty turning over in bed (including adjusting bedclothes, sheets and blankets)?: A Little Difficulty moving from lying on  back to sitting on the side of the bed? : A Little Difficulty sitting down on and standing up from a chair with arms (e.g., wheelchair, bedside commode, etc,.)?: Unable Help needed moving to and from a bed to chair (including a wheelchair)?: A Little Help needed walking in hospital room?: A Lot Help needed climbing 3-5 steps with a railing? : Total 6 Click Score: 13    End of Session Equipment Utilized During Treatment: Gait belt Activity Tolerance: Patient tolerated treatment well Patient left: in chair;with call bell/phone within reach;with chair alarm set;with SCD's  reapplied;Other (comment)(L LE elevated on pillow) Nurse Communication: Mobility status;Precautions;Weight bearing status PT Visit Diagnosis: Other abnormalities of gait and mobility (R26.89);Muscle weakness (generalized) (M62.81);History of falling (Z91.81);Pain Pain - Right/Left: Left Pain - part of body: Ankle and joints of foot    Time: 2248-2500 PT Time Calculation (min) (ACUTE ONLY): 38 min   Charges:   PT Evaluation $PT Eval Low Complexity: 1 Low PT Treatments $Therapeutic Activity: 8-22 mins $Wheel Chair Management: 8-22 mins   PT G Codes:   PT G-Codes **NOT FOR INPATIENT CLASS** Functional Assessment Tool Used: AM-PAC 6 Clicks Basic Mobility Functional Limitation: Mobility: Walking and moving around Mobility: Walking and Moving Around Current Status (B7048): At least 40 percent but less than 60 percent impaired, limited or restricted Mobility: Walking and Moving Around Goal Status [phone]): At least 1 percent but less than 20 percent impaired, limited or restricted    Leitha Bleak, PT 03/27/17, 11:32 AM [phone]

## 2017-03-27 NOTE — Progress Notes (Signed)
[facility] NAME: Terri [name]    MR#:  [id number]  DATE OF BIRTH:  [birth year]  SUBJECTIVE:  Patient getting physical therapy Sugars under better control  REVIEW OF SYSTEMS:   Review of Systems  Constitutional: Negative for chills, fever and weight loss.  HENT: Negative for ear discharge, ear pain and nosebleeds.   Eyes: Negative for blurred vision, pain and discharge.  Respiratory: Negative for sputum production, shortness of breath, wheezing and stridor.   Cardiovascular: Negative for chest pain, palpitations, orthopnea and PND.  Gastrointestinal: Negative for abdominal pain, diarrhea, nausea and vomiting.  Genitourinary: Negative for frequency and urgency.  Musculoskeletal: Positive for joint pain. Negative for back pain.  Neurological: Positive for weakness. Negative for sensory change, speech change and focal weakness.  Psychiatric/Behavioral: Negative for depression and hallucinations. The patient is not nervous/anxious.    Tolerating Diet:yes Tolerating PT: HHPT  DRUG ALLERGIES:  No Known Allergies  VITALS:  Blood pressure (!) 148/55, pulse 73, temperature 97.6 F (36.4 C), temperature source Oral, resp. rate 20, height 5\' 3"  (1.6 m), weight 99.8 kg (220 lb), SpO2 95 %.  PHYSICAL EXAMINATION:   Physical Exam  GENERAL:  66 y.o.-year-old patient lying in the bed with no acute distress.  EYES: Pupils equal, round, reactive to light and accommodation. No scleral icterus. Extraocular muscles intact.  HEENT: Head atraumatic, normocephalic. Oropharynx and nasopharynx clear.  NECK:  Supple, no jugular venous distention. No thyroid enlargement, no tenderness.  LUNGS: Normal breath sounds bilaterally, no wheezing, rales, rhonchi. No use of accessory muscles of respiration.  CARDIOVASCULAR: S1, S2 normal. No murmurs, rubs, or gallops.  ABDOMEN: Soft, nontender, nondistended. Bowel sounds present. No organomegaly or  mass.  EXTREMITIES: No cyanosis, clubbing or edema b/l.  Left foot dressing+  NEUROLOGIC: Cranial nerves II through XII are intact. No focal Motor or sensory deficits b/l.   PSYCHIATRIC:  patient is alert and oriented x 3.  SKIN: No obvious rash, lesion, or ulcer.   LABORATORY PANEL:  CBC Recent Labs  Lab 03/27/17 0433  WBC 11.0  HGB 12.5  HCT 38.2  PLT 222    Chemistries  Recent Labs  Lab 03/27/17 0433  NA 136  K 4.3  CL 99*  CO2 27  GLUCOSE 219*  BUN 26*  CREATININE 1.04*  CALCIUM 8.6*   Cardiac Enzymes No results for input(s): TROPONINI in the last 168 hours. RADIOLOGY:  Dg Ankle 2 Views Left  Result Date: 03/26/2017 CLINICAL DATA:  Left ankle fracture. EXAM: LEFT ANKLE - 2 VIEW; DG C-ARM 1-60 MIN-NO REPORT COMPARISON:  03/26/2017 FINDINGS: Intraoperative images were obtained of the left ankle. Plate and screw fixation of the distal fibula. Markedly improved alignment of the left ankle on these intraoperative images. IMPRESSION: Internal fixation of the left ankle fracture. Electronically Signed   By: Markus Daft M.D.   On: 03/26/2017 18:03   Dg Ankle Complete Left  Result Date: 03/26/2017 CLINICAL DATA:  [place] from 1 foot high stool today, medial LEFT ankle pain, bruising and swelling, history hypertension, diabetes mellitus EXAM: LEFT ANKLE COMPLETE - 3+ VIEW COMPARISON:  None FINDINGS: Osseous demineralization. Oblique mildly displaced fracture of the distal LEFT fibula. Marked lateral subluxation of the talus with marked widening of the medial ankle joint. Probable posterior malleolar avulsion fracture. Small elongated calcification is seen medial to the talus, appears corticated, uncertain if represents an ossicle or a small avulsion fragment. No additional fracture, dislocation, or bone destruction.  Bulky plantar calcaneal spur. Additional probable spurring at dorsal margin of distal talus. IMPRESSION: Displaced distal LEFT fibular fracture. Probable posterior  malleolar avulsion fracture. Lateral subluxation of the talus with marked widening of the medial ankle joint line. Questionable ossicle versus small avulsion fragment medial to the talus. Electronically Signed   By: Lavonia Dana M.D.   On: 03/26/2017 13:57   Dg C-arm 1-60 Min-no Report  Result Date: 03/26/2017 Fluoroscopy was utilized by the requesting physician.  No radiographic interpretation.   ASSESSMENT AND PLAN:  66 year old female with past medical history of COPD, diabetes, hypertension, hyperlipidemia, depression who presented to the hospital after a mechanical fall and noted to have a left ankle fracture.  1. left foot ankle fracture status post mechanical fall -Postop day 1 doing well working with physical therapy  -Discharge planning to home with home health PT  2. Diabetes type 2 without complication -Resume insulin Lantus 25 units twice daily and insulin NovoLog regular 13 units 3 times a day along with metformin at discharge   4. Essential hypertension-continue atenolol, Norvasc, losartan/HCTZ.  5. Hyperlipidemia-continue atorvastatin.  6. Depression-continue Wellbutrin.  Patient is discharged by orthopedic to go home  Case discussed with Care Management/Social Worker. Management plans discussed with the patient, family and they are in agreement.  CODE STATUS: full  DVT Prophylaxis: lovenox  TOTAL TIME TAKING CARE OF THIS PATIENT: *30* minutes.  >50% time spent on counselling and coordination of care  POSSIBLE D/C IN *1-2* DAYS, DEPENDING ON CLINICAL CONDITION.  Note: This dictation was prepared with Dragon dictation along with smaller phrase technology. Any transcriptional errors that result from this process are unintentional.  Fritzi Mandes M.D on 03/27/2017 at 12:00 PM  Between 7am to 6pm - Pager - [phone]  After 6pm go to [email or web address] - password [email or web address] [facility]  Office  [phone]  CC: Primary care physician; Idelle Crouch, MD

## 2017-03-27 NOTE — Care Management Note (Addendum)
Case Management Note  Patient Details  Name: Terri [name] MRN: [id number] Date of Birth: [birth year]  Subjective/Objective: POD  # 1 ORIF left  ankle fracture. Patient lives alone and has no support from family or friends. She lives in a 2 story apartment and will be staying downstairs. Will need a walker, ordered from [facility]. Patient will need to transport home with EMS. She has no one that can transport her and is not safe in a cab. Mel Almond, CSW spoke with Ron at [facility] and he states they will transport a folded walker home via EMS today. Patient states she has no way to pick up prescriptions. TC to [facility] who will deliver her medications to her home. Oxycodone will be delivered today and Lovenox will be delivered tomorrow. Called Lovenox 40 mg # 14 no refills per prescription on the chart. Spoke with Dr.Poggi who states he will e-scribe her oxycodone over to [facility]. Spoke with Vivien Rota at [facility] who states pharmacy will deliver her pain medications today between 12:30-3:30. Patient given name and number to pharmacy to call as soon as she gets home. Requested primary nurse call EMS at approximately 12 noon. Offered choice of home health agencies. Referral to [facility] for HHPT. Verified address and home phone number with patient. Patient updated on POC and is in agreement.                   Action/Plan: [facility] for HHPT, Walker. Requested home health visit within 24 hours from [facility].   Expected Discharge Date:  03/27/17               Expected Discharge Plan:  [place]  In-House Referral:     Discharge planning Services  CM Consult  Post Acute Care Choice:  Durable Medical Equipment, Home Health Choice offered to:  Patient  DME Arranged:  Walker rolling DME Agency:  [facility] Arranged:  PT [facility] Agency:  [facility]  Status of Service:  Completed, signed off  If discussed at [place] of Stay Meetings,  dates discussed:    Additional Comments:  Jolly Mango, RN 03/27/2017, 9:28 AM

## 2017-03-27 NOTE — Progress Notes (Signed)
Discharge instructions and prescriptions given to pt. EMS here for transfer. No other questions by pt.

## 2017-03-27 NOTE — Progress Notes (Signed)
  Subjective: 1 Day Post-Op Procedure(s) (LRB): OPEN REDUCTION INTERNAL FIXATION (ORIF) ANKLE FRACTURE (Left) Patient reports pain as moderate.   Patient seen in rounds with Dr. Roland Rack. Patient is well, and has had no acute complaints or problems Plan is to go Home after hospital stay. Negative for chest pain and shortness of breath Fever: no Gastrointestinal: Negative for nausea and vomiting  Objective: Vital signs in last 24 hours: Temp:  [97.8 F (36.6 C)-98.8 F (37.1 C)] 97.8 F (36.6 C) (11/28 0510) Pulse Rate:  [73-95] 78 (11/28 0510) Resp:  [12-20] 18 (11/28 0510) BP: (101-190)/(63-90) 146/66 (11/28 0510) SpO2:  [90 %-100 %] 93 % (11/28 0510) Weight:  [99.8 kg (220 lb)] 99.8 kg (220 lb) (11/27 1230)  Intake/Output from previous day:  Intake/Output Summary (Last 24 hours) at 03/27/2017 0649 Last data filed at 03/27/2017 0551 Gross per 24 hour  Intake 1867.5 ml  Output 1205 ml  Net 662.5 ml    Intake/Output this shift: Total I/O In: 1067.5 [P.O.:240; I.V.:587.5; IV Piggyback:240] Out: 1200 [Urine:1200]  Labs: Recent Labs    03/26/17 1434 03/27/17 0433  HGB 13.5 12.5   Recent Labs    03/26/17 1434 03/27/17 0433  WBC 13.2* 11.0  RBC 5.10 4.75  HCT 40.8 38.2  PLT 226 222   Recent Labs    03/26/17 1434 03/27/17 0433  NA 138 136  K 3.8 4.3  CL 99* 99*  CO2 28 27  BUN 26* 26*  CREATININE 0.86 1.04*  GLUCOSE 122* 219*  CALCIUM 9.5 8.6*   Recent Labs    03/26/17 1434  INR 0.91     EXAM General - Patient is Alert and Oriented Extremity - Sensation intact distally Intact pulses distally Dressing/Incision - clean, dry Motor Function - intact, moving toes well on exam.   Past Medical History:  Diagnosis Date  . COPD (chronic obstructive pulmonary disease) ([place])   . Depression   . Diabetes mellitus without complication ([place])   . Hyperlipemia   . Hypertension   . Obesity     Assessment/Plan: 1 Day Post-Op Procedure(s) (LRB): OPEN  REDUCTION INTERNAL FIXATION (ORIF) ANKLE FRACTURE (Left) Active Problems:   Ankle fracture, left  Estimated body mass index is 38.97 kg/m as calculated from the following:   Height as of this encounter: 5\' 3"  (1.6 m).   Weight as of this encounter: 99.8 kg (220 lb). [facility] diet Up with therapy D/C IV fluids  Discharge home after evaluated by care management. We'll need transport home.  DVT Prophylaxis - Lovenox Nonweightbearing to left leg.  Reche Dixon, PA-C Orthopaedic Surgery 03/27/2017, 6:49 AM

## 2017-03-27 NOTE — Discharge Instructions (Signed)
INSTRUCTIONS AFTER Surgery  o Remove items at home which could result in a fall. This includes throw rugs or furniture in walking pathways o ICE to the affected joint every three hours while awake for 30 minutes at a time, for at least the first 3-5 days, and then as needed for pain and swelling.  Continue to use ice for pain and swelling. You may notice swelling that will progress down to the foot and ankle.  This is normal after surgery.  Elevate your leg when you are not up walking on it.   o Continue to use the breathing machine you got in the hospital (incentive spirometer) which will help keep your temperature down.  It is common for your temperature to cycle up and down following surgery, especially at night when you are not up moving around and exerting yourself.  The breathing machine keeps your lungs expanded and your temperature down.   DIET:  As you were doing prior to hospitalization, we recommend a well-balanced diet.  DRESSING / WOUND CARE / SHOWERING  Keep the splint and dressings on the ankle. Do not remove. Keep the dressings dry and clean. No showering.  ACTIVITY  o Increase activity slowly as tolerated, but follow the weight bearing instructions below.   o No driving for 6 weeks or until further direction given by your physician.  You cannot drive while taking narcotics.  o No lifting or carrying greater than 10 lbs. until further directed by your surgeon. o Avoid periods of inactivity such as sitting longer than an hour when not asleep. This helps prevent blood clots.  o You may return to work once you are authorized by your doctor.     WEIGHT BEARING  Nonweightbearing on the left leg with a walker.   EXERCISES Limited exercises. No physical therapy.  CONSTIPATION  Constipation is defined medically as fewer than three stools per week and severe constipation as less than one stool per week.  Even if you have a regular bowel pattern at home, your normal regimen is  likely to be disrupted due to multiple reasons following surgery.  Combination of anesthesia, postoperative narcotics, change in appetite and fluid intake all can affect your bowels.   YOU MUST use at least one of the following options; they are listed in order of increasing strength to get the job done.  They are all available over the counter, and you may need to use some, POSSIBLY even all of these options:    Drink plenty of fluids (prune juice may be helpful) and high fiber foods Colace 100 mg by mouth twice a day  Senokot for constipation as directed and as needed Dulcolax (bisacodyl), take with full glass of water  Miralax (polyethylene glycol) once or twice a day as needed.  If you have tried all these things and are unable to have a bowel movement in the first 3-4 days after surgery call either your surgeon or your primary doctor.    If you experience loose stools or diarrhea, hold the medications until you stool forms back up.  If your symptoms do not get better within 1 week or if they get worse, check with your doctor.  If you experience "the worst abdominal pain ever" or develop nausea or vomiting, please contact the office immediately for further recommendations for treatment.   ITCHING:  If you experience itching with your medications, try taking only a single pain pill, or even half a pain pill at a time.  You can also use Benadryl over the counter for itching or also to help with sleep.   TED HOSE STOCKINGS:  Use stockings on both legs until for at least 2 weeks or as directed by physician office. They may be removed at night for sleeping.  MEDICATIONS:  See your medication summary on the After Visit Summary that nursing will review with you.  You may have some home medications which will be placed on hold until you complete the course of blood thinner medication.  It is important for you to complete the blood thinner medication as prescribed.  PRECAUTIONS:  If you experience  chest pain or shortness of breath - call 911 immediately for transfer to the hospital emergency department.   If you develop a fever greater that 101 F, purulent drainage from wound, increased redness or drainage from wound, foul odor from the wound/dressing, or calf pain - CONTACT YOUR SURGEON.                                                   FOLLOW-UP APPOINTMENTS:  If you do not already have a post-op appointment, please call the office for an appointment to be seen by your surgeon.  Guidelines for how soon to be seen are listed in your After Visit Summary, but are typically between 1-4 weeks after surgery.  OTHER INSTRUCTIONS:     MAKE SURE YOU:   Understand these instructions.   Get help right away if you are not doing well or get worse.    Thank you for letting us be a part of your medical care team.  It is a privilege we respect greatly.  We hope these instructions will help you stay on track for a fast and full recovery!

## 2017-03-27 NOTE — Progress Notes (Signed)
PT is recommending SNF however per RN case manager and [employer] rep patient refused SNF because she wants to go home to her cats. FL2 completed in case home health social worker needs it to place her at SNF from home. Patient will D/C home today. Please reconsult if future social work needs arise. CSW signing off.   [employer], LCSW [phone]

## 2017-03-27 NOTE — Discharge Summary (Addendum)
Physician Discharge Summary  Subjective: 1 Day Post-Op Procedure(s) (LRB): OPEN REDUCTION INTERNAL FIXATION (ORIF) ANKLE FRACTURE (Left) Patient reports pain as moderate.   Patient seen in rounds with Dr. Roland Rack. Patient is well, and has had no acute complaints or problems Patient is ready to go home.  Physician Discharge Summary  Patient ID: Terri [name] MRN: [id number] DOB/AGE: 1950/05/11 66 y.o.  Admit date: 03/26/2017 Discharge date: 03/27/2017  Admission Diagnoses:  Discharge Diagnoses:  Active Problems:   Ankle fracture, left   Discharged Condition: fair  Hospital Course: The patient is postop day 1 from a left ankle ORIF. Her pain is more manageable. She is ready toward physical therapy and go home today. She will need assistance with going home.  Treatments:  surgery:  Open reduction and internal fixation of displaced fibular fracture, left ankle.  Surgeon:   Pascal Lux, MD  Assistant:   None  Anesthesia:   GET with popliteal block  Findings:   As above.  Complications:   None  EBL:   5 cc  Fluids:   800 cc crystalloid  UOP:   None  TT:   63 min at 300 mmHg  Drains:   None  Closure:   Staples  Implants:   Biomet ALPS 7-hole composite locking plate and screws     Discharge Exam: Blood pressure (!) 148/55, pulse 73, temperature 97.6 F (36.4 C), temperature source Oral, resp. rate 20, height 5\' 3"  (1.6 m), weight 99.8 kg (220 lb), SpO2 95 %.   Disposition: 01-Home or Self Care   Allergies as of 03/27/2017   No Known Allergies     Medication List    STOP taking these medications   azithromycin 250 MG tablet Commonly known as:  ZITHROMAX   predniSONE 10 MG (21) Tbpk tablet Commonly known as:  STERAPRED UNI-PAK 21 TAB     TAKE these medications   albuterol 108 (90 Base) MCG/ACT inhaler Commonly known as:  PROVENTIL HFA;VENTOLIN HFA Inhale 2 puffs into the lungs every 6 (six) hours as needed for wheezing or shortness  of breath.   amLODipine 10 MG tablet Commonly known as:  NORVASC Take 10 mg by mouth daily.   aspirin EC 81 MG tablet Take 81 mg by mouth daily.   atenolol 25 MG tablet Commonly known as:  TENORMIN Take 25 mg by mouth daily.   atorvastatin 80 MG tablet Commonly known as:  LIPITOR Take 80 mg by mouth daily.   buPROPion 300 MG 24 hr tablet Commonly known as:  WELLBUTRIN XL Take 300 mg by mouth daily.   cholecalciferol 400 units Tabs tablet Commonly known as:  VITAMIN D Take 400 Units by mouth daily.   enoxaparin 40 MG/0.4ML injection Commonly known as:  LOVENOX Inject 0.4 mLs (40 mg total) into the skin daily.   insulin aspart 100 UNIT/ML injection Commonly known as:  novoLOG Inject 13 Units into the skin 3 (three) times daily before meals.   insulin glargine 100 UNIT/ML injection Commonly known as:  LANTUS Inject 25 Units into the skin 2 (two) times daily.   losartan-hydrochlorothiazide 100-25 MG tablet Commonly known as:  HYZAAR Take 1 tablet by mouth daily.   metFORMIN 500 MG tablet Commonly known as:  GLUCOPHAGE Take 1,000 mg by mouth 2 (two) times daily with a meal.   montelukast 10 MG tablet Commonly known as:  SINGULAIR Take 10 mg by mouth at bedtime.   oxyCODONE 5 MG immediate release tablet Commonly known as:  Oxy IR/ROXICODONE  Take 1 tablet (5 mg total) by mouth every 4 (four) hours as needed for moderate pain ((score 4 to 6)).            Durable Medical Equipment  (From admission, onward)        Start     Ordered   03/27/17 1057  For home use only DME standard manual wheelchair with seat cushion  Once    Comments:  Patient suffers from ankle fracture which impairs their ability to perform daily activities like ambulating in the home.  A walker will not resolve  issue with performing activities of daily living. A wheelchair will allow patient to safely perform daily activities. Patient can safely propel the wheelchair in the home or has a  caregiver who can provide assistance.  Accessories: elevating leg rests (ELRs), wheel locks, extensions and anti-tippers.   03/27/17 1101   03/27/17 0920  For home use only DME Walker rolling  Once    Question:  Patient needs a walker to treat with the following condition  Answer:  Ankle fracture   03/27/17 0920   03/26/17 1928  For home use only DME Other see comment  Once    Comments:  Standard walker   03/26/17 1927   03/26/17 1928  DME Bedside commode  Once    Question:  Patient needs a bedside commode to treat with the following condition  Answer:  Ankle fracture, left   03/26/17 1927     Follow-up Information    Lattie Corns, PA-C On 04/08/2017.   Specialty:  Physician Assistant Why:  AT 11AM Contact information: [place] [address] [phone]           Signed: Prescott Parma, Shevaun Lovan 03/27/2017, 11:21 AM   Objective: Vital signs in last 24 hours: Temp:  [97.6 F (36.4 C)-98.8 F (37.1 C)] 97.6 F (36.4 C) (11/28 1104) Pulse Rate:  [73-95] 73 (11/28 1104) Resp:  [12-20] 20 (11/28 1104) BP: (101-190)/(51-90) 148/55 (11/28 1104) SpO2:  [90 %-100 %] 95 % (11/28 1104) Weight:  [99.8 kg (220 lb)] 99.8 kg (220 lb) (11/27 1230)  Intake/Output from previous day:  Intake/Output Summary (Last 24 hours) at 03/27/2017 1121 Last data filed at 03/27/2017 1016 Gross per 24 hour  Intake 2107.5 ml  Output 1205 ml  Net 902.5 ml    Intake/Output this shift: Total I/O In: 240 [P.O.:240] Out: -   Labs: Recent Labs    03/26/17 1434 03/27/17 0433  HGB 13.5 12.5   Recent Labs    03/26/17 1434 03/27/17 0433  WBC 13.2* 11.0  RBC 5.10 4.75  HCT 40.8 38.2  PLT 226 222   Recent Labs    03/26/17 1434 03/27/17 0433  NA 138 136  K 3.8 4.3  CL 99* 99*  CO2 28 27  BUN 26* 26*  CREATININE 0.86 1.04*  GLUCOSE 122* 219*  CALCIUM 9.5 8.6*   Recent Labs    03/26/17 1434  INR 0.91    EXAM: General - Patient is  Alert and Oriented Extremity - Sensation intact distally Intact pulses distally Incision - clean, dry Motor Function -  plantar flexion and dorsiflexion of her toes is intact.  Assessment/Plan: 1 Day Post-Op Procedure(s) (LRB): OPEN REDUCTION INTERNAL FIXATION (ORIF) ANKLE FRACTURE (Left) Procedure(s) (LRB): OPEN REDUCTION INTERNAL FIXATION (ORIF) ANKLE FRACTURE (Left) Past Medical History:  Diagnosis Date  . COPD (chronic obstructive pulmonary disease) ([place])   . Depression   . Diabetes mellitus without complication (  [place])   . Hyperlipemia   . Hypertension   . Obesity    Active Problems:   Ankle fracture, left  Estimated body mass index is 38.97 kg/m as calculated from the following:   Height as of this encounter: 5\' 3"  (1.6 m).   Weight as of this encounter: 99.8 kg (220 lb). Advance diet Up with therapy D/C IV fluids  Discharge home. Diet - Regular diet Follow up - in 1 weeks Activity - NWB Disposition - Home Condition Upon Discharge - Stable DVT Prophylaxis - Lovenox  Reche Dixon, PA-C Orthopaedic Surgery 03/27/2017, 11:21 AM

## 2017-03-27 NOTE — NC FL2 (Signed)
[place] LEVEL OF CARE SCREENING TOOL     IDENTIFICATION  Patient Name: Terri [name] Birthdate: 1950-12-10 Sex: female Admission Date (Current Location): 03/26/2017  [place] and [place] Number:  Engineering geologist and Address:  [facility], [address], [place], [place] [address]      Provider Number: [id number]  Attending Physician Name and Address:  Corky Mull, MD  Relative Name and Phone Number:       Current Level of Care: Hospital Recommended Level of Care: [place] Prior Approval Number:    Date Approved/Denied:   PASRR Number: [id number])  Discharge Plan: SNF    Current Diagnoses: Patient Active Problem List   Diagnosis Date Noted  . Ankle fracture, left 03/26/2017  . Acute respiratory failure with hypoxia ([place]) 08/05/2015    Orientation RESPIRATION BLADDER Height & Weight     Self, Time, Situation, Place  Normal Continent Weight: 220 lb (99.8 kg) Height:  5\' 3"  (160 cm)  BEHAVIORAL SYMPTOMS/MOOD NEUROLOGICAL BOWEL NUTRITION STATUS      Continent Diet(Diet: Carb Modified. )  AMBULATORY STATUS COMMUNICATION OF NEEDS Skin   Extensive Assist Verbally Surgical wounds(Incision: Left Ankle. )                       Personal Care Assistance Level of Assistance  Bathing, Feeding, Dressing Bathing Assistance: Limited assistance Feeding assistance: Independent Dressing Assistance: Limited assistance     Functional Limitations Info  Sight, Hearing, Speech Sight Info: Adequate Hearing Info: Adequate Speech Info: Adequate    SPECIAL CARE FACTORS FREQUENCY  PT (By licensed PT), OT (By licensed OT)     PT Frequency: (5) OT Frequency: (5)            Contractures      Additional Factors Info  Code Status, Allergies Code Status Info: (Full Code. ) Allergies Info: (No Known Allergies. )           Current Medications (03/27/2017):  This is the current hospital active medication  list Current Facility-Administered Medications  Medication Dose Route Frequency Provider Last Rate Last Dose  . 0.9 % NaCl with KCl 20 mEq/ L  infusion   Intravenous Continuous Poggi, Marshall Cork, MD 75 mL/hr at 03/26/17 2038    . acetaminophen (TYLENOL) tablet 650 mg  650 mg Oral Q4H PRN Poggi, Marshall Cork, MD       Or  . acetaminophen (TYLENOL) suppository 650 mg  650 mg Rectal Q4H PRN Poggi, Marshall Cork, MD      . acetaminophen (TYLENOL) tablet 1,000 mg  1,000 mg Oral Q6H Poggi, Marshall Cork, MD   1,000 mg at 03/27/17 0517  . albuterol (PROVENTIL) (2.5 MG/3ML) 0.083% nebulizer solution 2.5 mg  2.5 mg Nebulization Q6H PRN Poggi, Marshall Cork, MD      . amLODipine (NORVASC) tablet 10 mg  10 mg Oral Daily Henreitta Leber, MD   10 mg at 03/27/17 1106  . aspirin EC tablet 81 mg  81 mg Oral Daily Poggi, Marshall Cork, MD   81 mg at 03/27/17 1101  . atenolol (TENORMIN) tablet 25 mg  25 mg Oral Daily Henreitta Leber, MD   25 mg at 03/27/17 1107  . atorvastatin (LIPITOR) tablet 80 mg  80 mg Oral Daily Henreitta Leber, MD   80 mg at 03/27/17 1102  . bisacodyl (DULCOLAX) suppository 10 mg  10 mg Rectal Daily PRN Poggi, Marshall Cork, MD      .  buPROPion (WELLBUTRIN XL) 24 hr tablet 300 mg  300 mg Oral Daily Henreitta Leber, MD   300 mg at 03/27/17 1101  . cholecalciferol (VITAMIN D) tablet 400 Units  400 Units Oral Daily Sainani, Belia Heman, MD      . diphenhydrAMINE (BENADRYL) 12.5 MG/5ML elixir 12.5-25 mg  12.5-25 mg Oral Q4H PRN Poggi, Marshall Cork, MD      . docusate sodium (COLACE) capsule 100 mg  100 mg Oral BID Poggi, Marshall Cork, MD   100 mg at 03/27/17 1101  . enoxaparin (LOVENOX) injection 40 mg  40 mg Subcutaneous Q24H Poggi, Marshall Cork, MD   40 mg at 03/27/17 0811  . losartan (COZAAR) tablet 100 mg  100 mg Oral Daily Poggi, Marshall Cork, MD   100 mg at 03/27/17 1105   And  . hydrochlorothiazide (HYDRODIURIL) tablet 25 mg  25 mg Oral Daily Poggi, Marshall Cork, MD   25 mg at 03/27/17 1107  . HYDROmorphone (DILAUDID) injection 0.5-1 mg  0.5-1 mg Intravenous  Q2H PRN Poggi, Marshall Cork, MD      . insulin aspart (novoLOG) injection 0-5 Units  0-5 Units Subcutaneous QHS Sainani, Vivek J, MD      . insulin aspart (novoLOG) injection 0-9 Units  0-9 Units Subcutaneous TID WC Henreitta Leber, MD   2 Units at 03/27/17 [phone]  . ketorolac (TORADOL) 15 MG/ML injection 7.5 mg  7.5 mg Intravenous Q6H Poggi, Marshall Cork, MD   7.5 mg at 03/27/17 0518  . magnesium hydroxide (MILK OF MAGNESIA) suspension 30 mL  30 mL Oral Daily PRN Poggi, Marshall Cork, MD      . metoCLOPramide (REGLAN) tablet 5-10 mg  5-10 mg Oral Q8H PRN Poggi, Marshall Cork, MD       Or  . metoCLOPramide (REGLAN) injection 5-10 mg  5-10 mg Intravenous Q8H PRN Poggi, Marshall Cork, MD      . montelukast (SINGULAIR) tablet 10 mg  10 mg Oral QHS Henreitta Leber, MD   10 mg at 03/26/17 2150  . ondansetron (ZOFRAN) tablet 4 mg  4 mg Oral Q6H PRN Poggi, Marshall Cork, MD       Or  . ondansetron (ZOFRAN) injection 4 mg  4 mg Intravenous Q6H PRN Poggi, Marshall Cork, MD      . oxyCODONE (Oxy IR/ROXICODONE) immediate release tablet 10 mg  10 mg Oral Q3H PRN Poggi, Marshall Cork, MD      . oxyCODONE (Oxy IR/ROXICODONE) immediate release tablet 5 mg  5 mg Oral Q3H PRN Poggi, Marshall Cork, MD   5 mg at 03/27/17 0913  . pantoprazole (PROTONIX) EC tablet 40 mg  40 mg Oral Daily Poggi, Marshall Cork, MD   40 mg at 03/27/17 1102  . sodium phosphate (FLEET) 7-19 GM/118ML enema 1 enema  1 enema Rectal Once PRN Poggi, Marshall Cork, MD         Discharge Medications: Please see discharge summary for a list of discharge medications.  Relevant Imaging Results:  Relevant Lab Results:   Additional Information (SSN: [id number])  Despina Boan, Veronia Beets, LCSW

## 2017-03-28 DIAGNOSIS — F329 Major depressive disorder, single episode, unspecified: Secondary | ICD-10-CM | POA: Diagnosis not present

## 2017-03-28 DIAGNOSIS — W19XXXD Unspecified fall, subsequent encounter: Secondary | ICD-10-CM | POA: Diagnosis not present

## 2017-03-28 DIAGNOSIS — Z79891 Long term (current) use of opiate analgesic: Secondary | ICD-10-CM | POA: Diagnosis not present

## 2017-03-28 DIAGNOSIS — J449 Chronic obstructive pulmonary disease, unspecified: Secondary | ICD-10-CM | POA: Diagnosis not present

## 2017-03-28 DIAGNOSIS — Z794 Long term (current) use of insulin: Secondary | ICD-10-CM | POA: Diagnosis not present

## 2017-03-28 DIAGNOSIS — E669 Obesity, unspecified: Secondary | ICD-10-CM | POA: Diagnosis not present

## 2017-03-28 DIAGNOSIS — S82892D Other fracture of left lower leg, subsequent encounter for closed fracture with routine healing: Secondary | ICD-10-CM | POA: Diagnosis not present

## 2017-03-28 DIAGNOSIS — I10 Essential (primary) hypertension: Secondary | ICD-10-CM | POA: Diagnosis not present

## 2017-03-28 DIAGNOSIS — E119 Type 2 diabetes mellitus without complications: Secondary | ICD-10-CM | POA: Diagnosis not present

## 2017-03-28 DIAGNOSIS — Z7982 Long term (current) use of aspirin: Secondary | ICD-10-CM | POA: Diagnosis not present

## 2017-03-28 DIAGNOSIS — E785 Hyperlipidemia, unspecified: Secondary | ICD-10-CM | POA: Diagnosis not present

## 2017-03-29 DIAGNOSIS — S82892A Other fracture of left lower leg, initial encounter for closed fracture: Secondary | ICD-10-CM | POA: Diagnosis not present

## 2017-04-10 DIAGNOSIS — S82892D Other fracture of left lower leg, subsequent encounter for closed fracture with routine healing: Secondary | ICD-10-CM | POA: Diagnosis not present

## 2017-04-10 DIAGNOSIS — Z967 Presence of other bone and tendon implants: Secondary | ICD-10-CM | POA: Diagnosis not present

## 2017-04-10 DIAGNOSIS — Z8781 Personal history of (healed) traumatic fracture: Secondary | ICD-10-CM | POA: Diagnosis not present

## 2017-04-26 DIAGNOSIS — Z8781 Personal history of (healed) traumatic fracture: Secondary | ICD-10-CM | POA: Diagnosis not present

## 2017-04-26 DIAGNOSIS — S82892D Other fracture of left lower leg, subsequent encounter for closed fracture with routine healing: Secondary | ICD-10-CM | POA: Diagnosis not present

## 2017-04-26 DIAGNOSIS — Z967 Presence of other bone and tendon implants: Secondary | ICD-10-CM | POA: Diagnosis not present

## 2017-04-28 DIAGNOSIS — S82892A Other fracture of left lower leg, initial encounter for closed fracture: Secondary | ICD-10-CM | POA: Diagnosis not present

## 2017-05-17 DIAGNOSIS — S82892D Other fracture of left lower leg, subsequent encounter for closed fracture with routine healing: Secondary | ICD-10-CM | POA: Diagnosis not present

## 2017-06-07 DIAGNOSIS — Z0000 Encounter for general adult medical examination without abnormal findings: Secondary | ICD-10-CM | POA: Diagnosis not present

## 2017-06-07 DIAGNOSIS — E669 Obesity, unspecified: Secondary | ICD-10-CM | POA: Diagnosis not present

## 2017-06-07 DIAGNOSIS — F3341 Major depressive disorder, recurrent, in partial remission: Secondary | ICD-10-CM | POA: Diagnosis not present

## 2017-06-07 DIAGNOSIS — J449 Chronic obstructive pulmonary disease, unspecified: Secondary | ICD-10-CM | POA: Diagnosis not present

## 2017-06-07 DIAGNOSIS — Z1231 Encounter for screening mammogram for malignant neoplasm of breast: Secondary | ICD-10-CM | POA: Diagnosis not present

## 2017-06-07 DIAGNOSIS — E118 Type 2 diabetes mellitus with unspecified complications: Secondary | ICD-10-CM | POA: Diagnosis not present

## 2017-06-07 DIAGNOSIS — I10 Essential (primary) hypertension: Secondary | ICD-10-CM | POA: Diagnosis not present

## 2017-06-07 DIAGNOSIS — Z794 Long term (current) use of insulin: Secondary | ICD-10-CM | POA: Diagnosis not present

## 2017-06-07 DIAGNOSIS — Z79899 Other long term (current) drug therapy: Secondary | ICD-10-CM | POA: Diagnosis not present

## 2017-06-07 DIAGNOSIS — E782 Mixed hyperlipidemia: Secondary | ICD-10-CM | POA: Diagnosis not present

## 2017-06-28 DIAGNOSIS — Z8781 Personal history of (healed) traumatic fracture: Secondary | ICD-10-CM | POA: Diagnosis not present

## 2017-06-28 DIAGNOSIS — S82892D Other fracture of left lower leg, subsequent encounter for closed fracture with routine healing: Secondary | ICD-10-CM | POA: Diagnosis not present

## 2017-06-28 DIAGNOSIS — Z967 Presence of other bone and tendon implants: Secondary | ICD-10-CM | POA: Diagnosis not present

## 2017-07-19 DIAGNOSIS — I10 Essential (primary) hypertension: Secondary | ICD-10-CM | POA: Diagnosis not present

## 2017-08-12 DIAGNOSIS — J449 Chronic obstructive pulmonary disease, unspecified: Secondary | ICD-10-CM | POA: Diagnosis not present

## 2017-08-12 DIAGNOSIS — E119 Type 2 diabetes mellitus without complications: Secondary | ICD-10-CM | POA: Diagnosis not present

## 2017-08-12 DIAGNOSIS — I10 Essential (primary) hypertension: Secondary | ICD-10-CM | POA: Diagnosis not present

## 2017-08-12 DIAGNOSIS — S82892D Other fracture of left lower leg, subsequent encounter for closed fracture with routine healing: Secondary | ICD-10-CM | POA: Diagnosis not present

## 2017-08-22 DIAGNOSIS — I10 Essential (primary) hypertension: Secondary | ICD-10-CM | POA: Diagnosis not present

## 2017-08-22 DIAGNOSIS — E782 Mixed hyperlipidemia: Secondary | ICD-10-CM | POA: Diagnosis not present

## 2017-08-22 DIAGNOSIS — E118 Type 2 diabetes mellitus with unspecified complications: Secondary | ICD-10-CM | POA: Diagnosis not present

## 2017-08-22 DIAGNOSIS — F3341 Major depressive disorder, recurrent, in partial remission: Secondary | ICD-10-CM | POA: Diagnosis not present

## 2017-08-22 DIAGNOSIS — Z794 Long term (current) use of insulin: Secondary | ICD-10-CM | POA: Diagnosis not present

## 2017-08-22 DIAGNOSIS — Z79899 Other long term (current) drug therapy: Secondary | ICD-10-CM | POA: Diagnosis not present

## 2017-09-12 DIAGNOSIS — I10 Essential (primary) hypertension: Secondary | ICD-10-CM | POA: Diagnosis not present

## 2017-09-12 DIAGNOSIS — E118 Type 2 diabetes mellitus with unspecified complications: Secondary | ICD-10-CM | POA: Diagnosis not present

## 2017-09-12 DIAGNOSIS — Z9641 Presence of insulin pump (external) (internal): Secondary | ICD-10-CM | POA: Diagnosis not present

## 2017-09-12 DIAGNOSIS — E782 Mixed hyperlipidemia: Secondary | ICD-10-CM | POA: Diagnosis not present

## 2017-09-12 DIAGNOSIS — E119 Type 2 diabetes mellitus without complications: Secondary | ICD-10-CM | POA: Diagnosis not present

## 2017-09-12 DIAGNOSIS — Z794 Long term (current) use of insulin: Secondary | ICD-10-CM | POA: Diagnosis not present

## 2017-10-08 ENCOUNTER — Other Ambulatory Visit: Payer: Self-pay | Admitting: Internal Medicine

## 2017-10-08 DIAGNOSIS — Z1231 Encounter for screening mammogram for malignant neoplasm of breast: Secondary | ICD-10-CM

## 2017-10-23 ENCOUNTER — Ambulatory Visit
Admission: RE | Admit: 2017-10-23 | Discharge: 2017-10-23 | Disposition: A | Discharge status: Home / Self Care | Payer: PPO | Source: Ambulatory Visit | Attending: Internal Medicine | Admitting: Internal Medicine

## 2017-10-23 DIAGNOSIS — Z1231 Encounter for screening mammogram for malignant neoplasm of breast: Secondary | ICD-10-CM | POA: Diagnosis not present

## 2017-11-05 DIAGNOSIS — E782 Mixed hyperlipidemia: Secondary | ICD-10-CM | POA: Diagnosis not present

## 2017-11-05 DIAGNOSIS — I10 Essential (primary) hypertension: Secondary | ICD-10-CM | POA: Insufficient documentation

## 2017-11-05 DIAGNOSIS — M8588 Other specified disorders of bone density and structure, other site: Secondary | ICD-10-CM | POA: Diagnosis not present

## 2017-11-05 DIAGNOSIS — Z794 Long term (current) use of insulin: Secondary | ICD-10-CM | POA: Diagnosis not present

## 2017-11-05 DIAGNOSIS — E119 Type 2 diabetes mellitus without complications: Secondary | ICD-10-CM | POA: Diagnosis not present

## 2017-11-21 DIAGNOSIS — E782 Mixed hyperlipidemia: Secondary | ICD-10-CM | POA: Diagnosis not present

## 2017-11-21 DIAGNOSIS — I10 Essential (primary) hypertension: Secondary | ICD-10-CM | POA: Diagnosis not present

## 2017-11-21 DIAGNOSIS — E118 Type 2 diabetes mellitus with unspecified complications: Secondary | ICD-10-CM | POA: Diagnosis not present

## 2017-11-21 DIAGNOSIS — Z794 Long term (current) use of insulin: Secondary | ICD-10-CM | POA: Diagnosis not present

## 2017-11-21 DIAGNOSIS — Z79899 Other long term (current) drug therapy: Secondary | ICD-10-CM | POA: Diagnosis not present

## 2017-12-18 DIAGNOSIS — E782 Mixed hyperlipidemia: Secondary | ICD-10-CM | POA: Diagnosis not present

## 2017-12-18 DIAGNOSIS — Z794 Long term (current) use of insulin: Secondary | ICD-10-CM | POA: Diagnosis not present

## 2017-12-18 DIAGNOSIS — Z79899 Other long term (current) drug therapy: Secondary | ICD-10-CM | POA: Diagnosis not present

## 2017-12-18 DIAGNOSIS — E118 Type 2 diabetes mellitus with unspecified complications: Secondary | ICD-10-CM | POA: Diagnosis not present

## 2017-12-18 DIAGNOSIS — J449 Chronic obstructive pulmonary disease, unspecified: Secondary | ICD-10-CM | POA: Diagnosis not present

## 2017-12-18 DIAGNOSIS — I10 Essential (primary) hypertension: Secondary | ICD-10-CM | POA: Diagnosis not present

## 2018-01-03 IMAGING — MG MM DIGITAL SCREENING BILAT W/ TOMO W/ CAD
8 of 13 series · 8 of 29 positions shown · non-contrast
Comparison: None.

ACR Breast Density Category a: The breast tissue is almost entirely
fatty.

CLINICAL DATA: Screening.

EXAM:
2D DIGITAL SCREENING BILATERAL MAMMOGRAM WITH CAD AND ADJUNCT TOMO

[L MLO (1 of 2)]
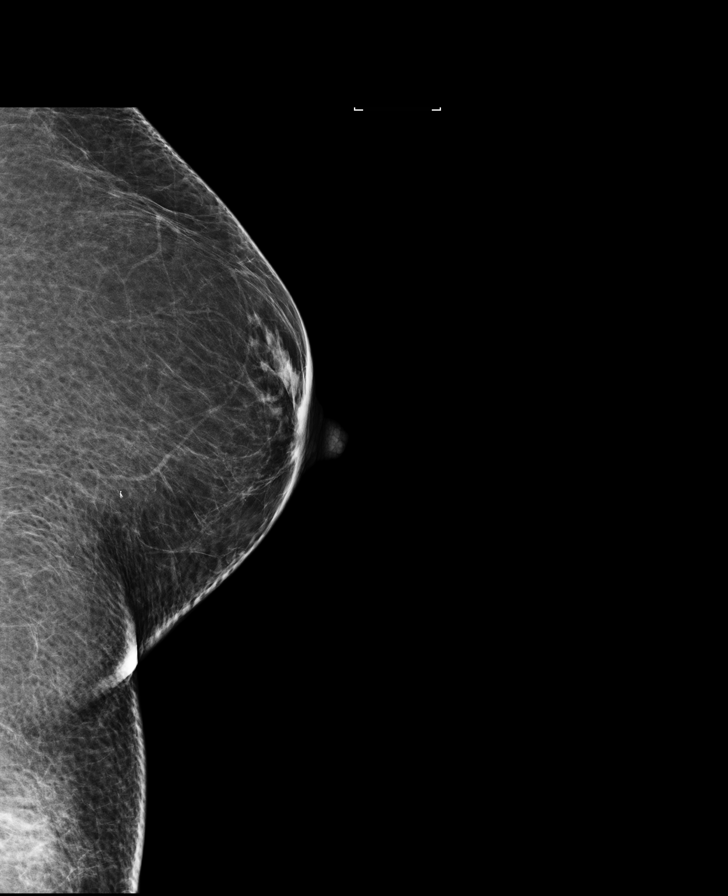

[L MLO synth-2D]
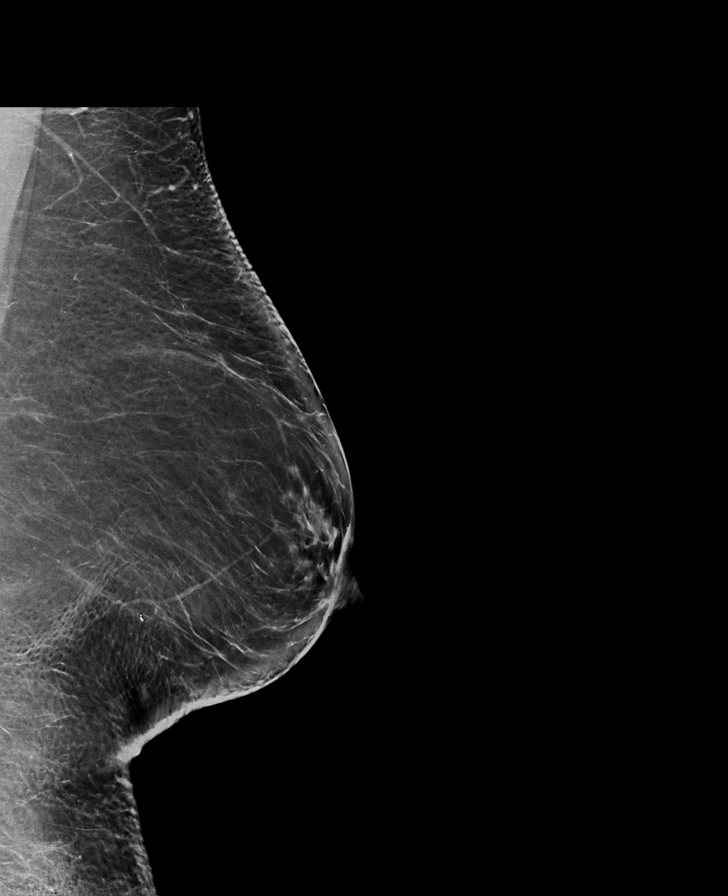

[L CC synth-2D]
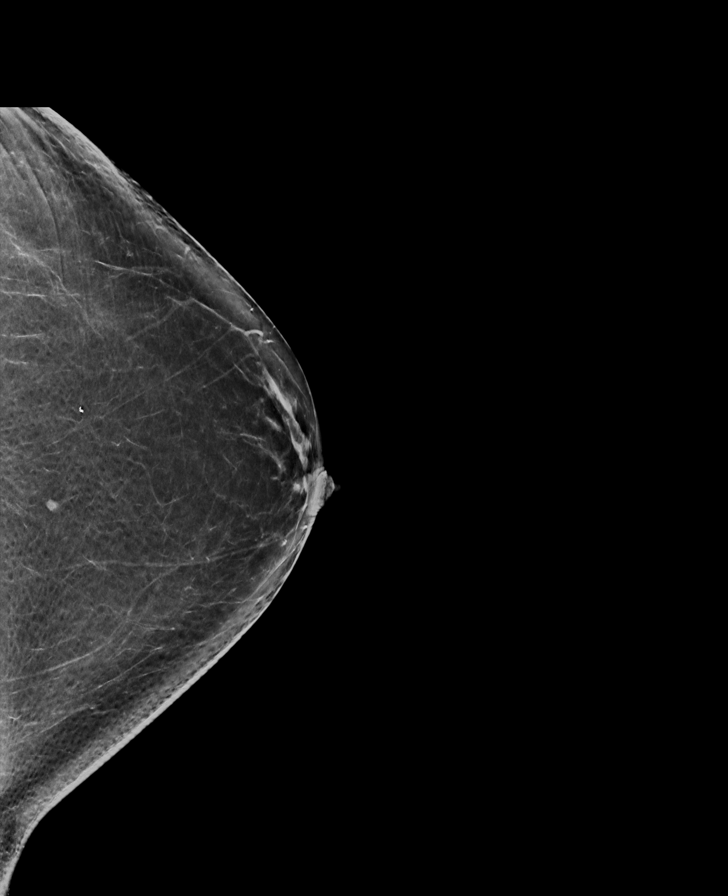

[R MLO synth-2D]
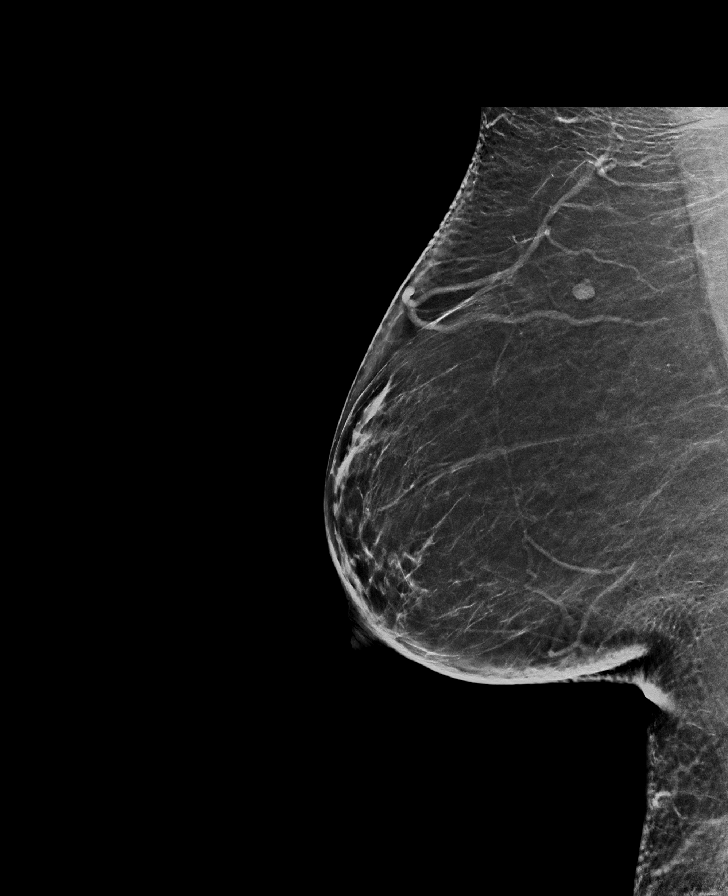

[R MLO]
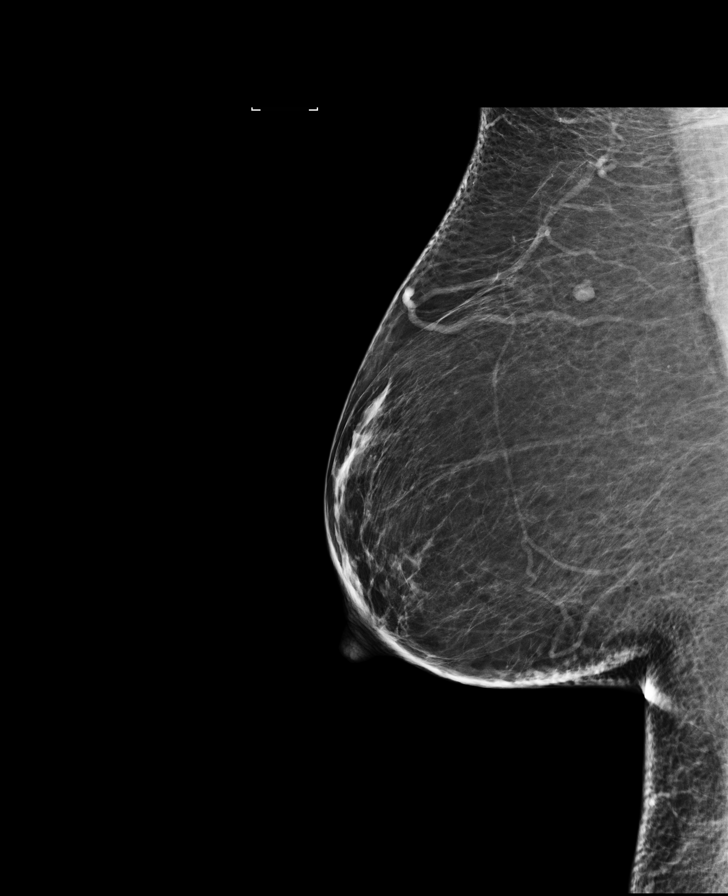

[R CC]
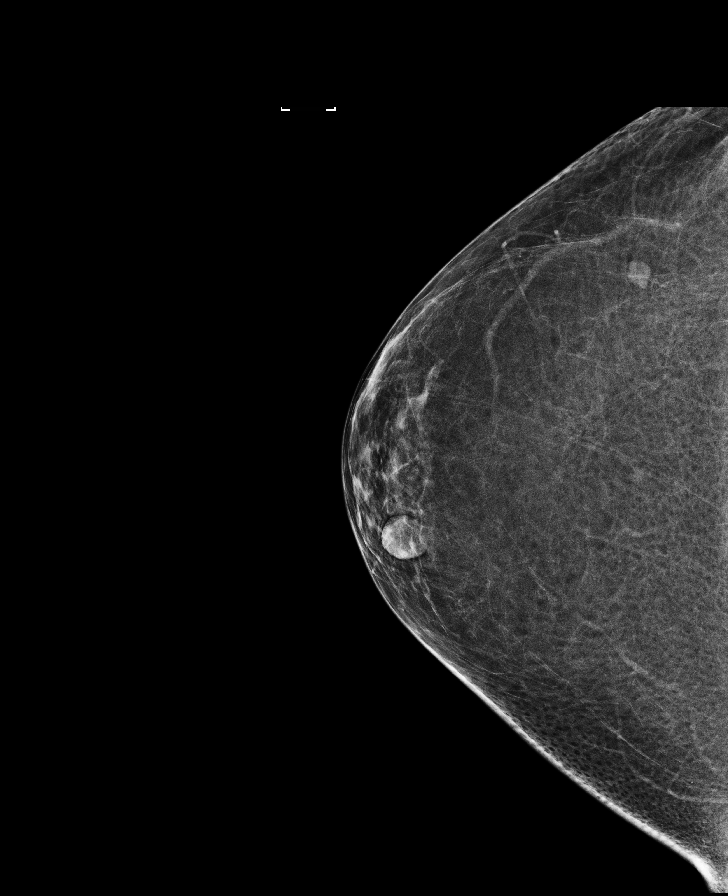

[L CC]
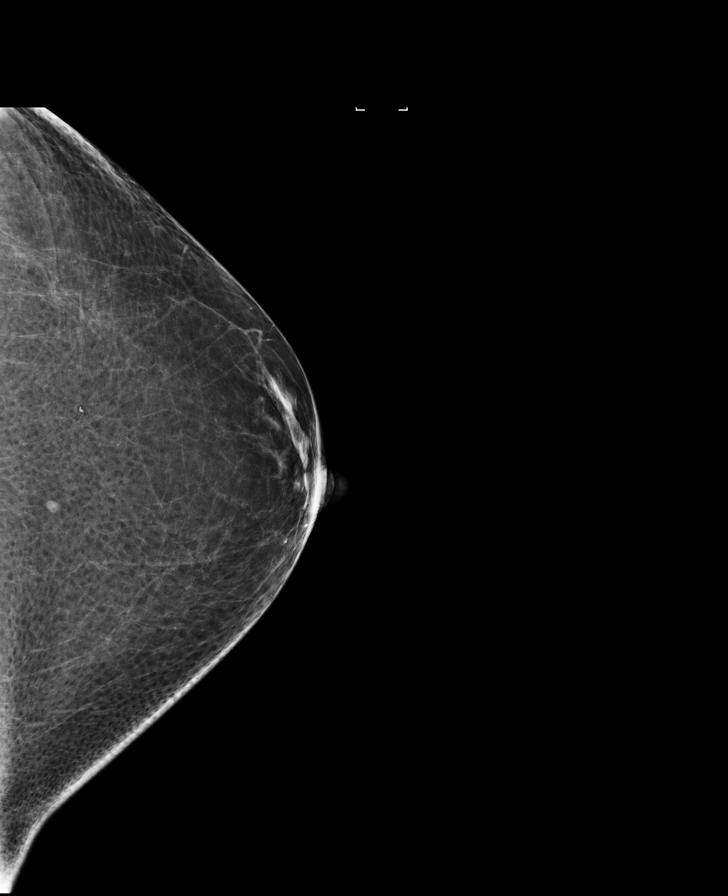

[L MLO (2 of 2)]
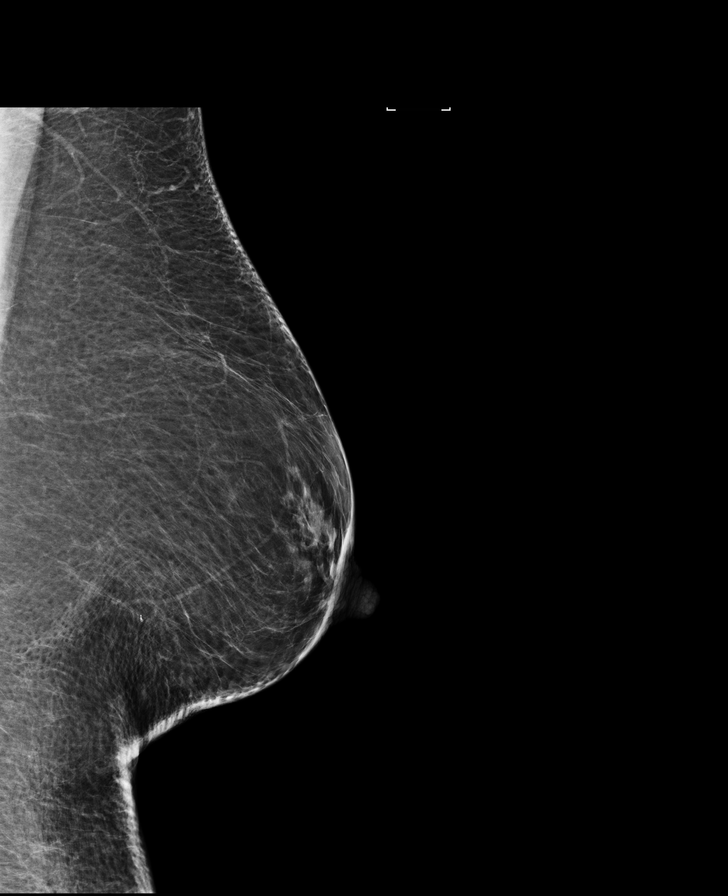

[8 of 29 positions shown; findings below may reference images not displayed]

FINDINGS: There are no findings suspicious for malignancy. Images were
processed with CAD.
IMPRESSION: No mammographic evidence of malignancy. A result letter of this
screening mammogram will be mailed directly to the patient.

RECOMMENDATION:
Screening mammogram in one year. (Code:[id number])

BI-RADS CATEGORY  1: Negative.

## 2018-02-26 DIAGNOSIS — E118 Type 2 diabetes mellitus with unspecified complications: Secondary | ICD-10-CM | POA: Diagnosis not present

## 2018-02-26 DIAGNOSIS — I10 Essential (primary) hypertension: Secondary | ICD-10-CM | POA: Diagnosis not present

## 2018-02-26 DIAGNOSIS — Z23 Encounter for immunization: Secondary | ICD-10-CM | POA: Diagnosis not present

## 2018-02-26 DIAGNOSIS — Z794 Long term (current) use of insulin: Secondary | ICD-10-CM | POA: Diagnosis not present

## 2018-02-26 DIAGNOSIS — E785 Hyperlipidemia, unspecified: Secondary | ICD-10-CM | POA: Diagnosis not present

## 2018-02-26 DIAGNOSIS — E1169 Type 2 diabetes mellitus with other specified complication: Secondary | ICD-10-CM | POA: Diagnosis not present

## 2018-02-26 DIAGNOSIS — E1159 Type 2 diabetes mellitus with other circulatory complications: Secondary | ICD-10-CM | POA: Diagnosis not present

## 2018-03-13 DIAGNOSIS — E118 Type 2 diabetes mellitus with unspecified complications: Secondary | ICD-10-CM | POA: Diagnosis not present

## 2018-03-13 DIAGNOSIS — Z794 Long term (current) use of insulin: Secondary | ICD-10-CM | POA: Diagnosis not present

## 2018-03-13 DIAGNOSIS — Z79899 Other long term (current) drug therapy: Secondary | ICD-10-CM | POA: Diagnosis not present

## 2018-03-20 DIAGNOSIS — I10 Essential (primary) hypertension: Secondary | ICD-10-CM | POA: Diagnosis not present

## 2018-03-20 DIAGNOSIS — Z794 Long term (current) use of insulin: Secondary | ICD-10-CM | POA: Diagnosis not present

## 2018-03-20 DIAGNOSIS — E118 Type 2 diabetes mellitus with unspecified complications: Secondary | ICD-10-CM | POA: Diagnosis not present

## 2018-03-20 DIAGNOSIS — E782 Mixed hyperlipidemia: Secondary | ICD-10-CM | POA: Diagnosis not present

## 2018-06-16 DIAGNOSIS — E119 Type 2 diabetes mellitus without complications: Secondary | ICD-10-CM | POA: Diagnosis not present

## 2018-06-16 DIAGNOSIS — E669 Obesity, unspecified: Secondary | ICD-10-CM | POA: Diagnosis not present

## 2018-06-16 DIAGNOSIS — E118 Type 2 diabetes mellitus with unspecified complications: Secondary | ICD-10-CM | POA: Diagnosis not present

## 2018-06-16 DIAGNOSIS — I10 Essential (primary) hypertension: Secondary | ICD-10-CM | POA: Diagnosis not present

## 2018-06-16 DIAGNOSIS — Z794 Long term (current) use of insulin: Secondary | ICD-10-CM | POA: Diagnosis not present

## 2018-06-16 DIAGNOSIS — E782 Mixed hyperlipidemia: Secondary | ICD-10-CM | POA: Diagnosis not present

## 2018-06-23 DIAGNOSIS — Z0000 Encounter for general adult medical examination without abnormal findings: Secondary | ICD-10-CM | POA: Diagnosis not present

## 2018-06-23 DIAGNOSIS — Z794 Long term (current) use of insulin: Secondary | ICD-10-CM | POA: Diagnosis not present

## 2018-06-23 DIAGNOSIS — E782 Mixed hyperlipidemia: Secondary | ICD-10-CM | POA: Diagnosis not present

## 2018-06-23 DIAGNOSIS — E118 Type 2 diabetes mellitus with unspecified complications: Secondary | ICD-10-CM | POA: Diagnosis not present

## 2018-06-23 DIAGNOSIS — J449 Chronic obstructive pulmonary disease, unspecified: Secondary | ICD-10-CM | POA: Diagnosis not present

## 2018-06-23 DIAGNOSIS — I10 Essential (primary) hypertension: Secondary | ICD-10-CM | POA: Diagnosis not present

## 2018-06-23 DIAGNOSIS — Z79899 Other long term (current) drug therapy: Secondary | ICD-10-CM | POA: Diagnosis not present

## 2018-09-16 DIAGNOSIS — Z794 Long term (current) use of insulin: Secondary | ICD-10-CM | POA: Diagnosis not present

## 2018-09-16 DIAGNOSIS — I10 Essential (primary) hypertension: Secondary | ICD-10-CM | POA: Diagnosis not present

## 2018-09-16 DIAGNOSIS — E782 Mixed hyperlipidemia: Secondary | ICD-10-CM | POA: Diagnosis not present

## 2018-09-16 DIAGNOSIS — E118 Type 2 diabetes mellitus with unspecified complications: Secondary | ICD-10-CM | POA: Diagnosis not present

## 2018-09-16 DIAGNOSIS — Z79899 Other long term (current) drug therapy: Secondary | ICD-10-CM | POA: Diagnosis not present

## 2018-09-23 DIAGNOSIS — J449 Chronic obstructive pulmonary disease, unspecified: Secondary | ICD-10-CM | POA: Diagnosis not present

## 2018-09-23 DIAGNOSIS — E118 Type 2 diabetes mellitus with unspecified complications: Secondary | ICD-10-CM | POA: Diagnosis not present

## 2018-09-23 DIAGNOSIS — Z794 Long term (current) use of insulin: Secondary | ICD-10-CM | POA: Diagnosis not present

## 2018-09-23 DIAGNOSIS — E782 Mixed hyperlipidemia: Secondary | ICD-10-CM | POA: Diagnosis not present

## 2018-09-23 DIAGNOSIS — I10 Essential (primary) hypertension: Secondary | ICD-10-CM | POA: Diagnosis not present

## 2018-12-02 ENCOUNTER — Other Ambulatory Visit: Payer: Self-pay | Admitting: Internal Medicine

## 2018-12-02 DIAGNOSIS — Z1231 Encounter for screening mammogram for malignant neoplasm of breast: Secondary | ICD-10-CM

## 2018-12-03 DIAGNOSIS — I10 Essential (primary) hypertension: Secondary | ICD-10-CM | POA: Diagnosis not present

## 2018-12-03 DIAGNOSIS — E669 Obesity, unspecified: Secondary | ICD-10-CM | POA: Diagnosis not present

## 2018-12-03 DIAGNOSIS — E119 Type 2 diabetes mellitus without complications: Secondary | ICD-10-CM | POA: Diagnosis not present

## 2018-12-03 DIAGNOSIS — E782 Mixed hyperlipidemia: Secondary | ICD-10-CM | POA: Diagnosis not present

## 2018-12-03 DIAGNOSIS — Z794 Long term (current) use of insulin: Secondary | ICD-10-CM | POA: Diagnosis not present

## 2019-01-12 ENCOUNTER — Ambulatory Visit
Admission: RE | Admit: 2019-01-12 | Discharge: 2019-01-12 | Disposition: A | Discharge status: Home / Self Care | Payer: PPO | Source: Ambulatory Visit | Attending: Internal Medicine | Admitting: Internal Medicine

## 2019-01-12 DIAGNOSIS — Z1231 Encounter for screening mammogram for malignant neoplasm of breast: Secondary | ICD-10-CM | POA: Insufficient documentation

## 2019-01-22 DIAGNOSIS — J449 Chronic obstructive pulmonary disease, unspecified: Secondary | ICD-10-CM | POA: Diagnosis not present

## 2019-01-22 DIAGNOSIS — I10 Essential (primary) hypertension: Secondary | ICD-10-CM | POA: Diagnosis not present

## 2019-01-22 DIAGNOSIS — Z794 Long term (current) use of insulin: Secondary | ICD-10-CM | POA: Diagnosis not present

## 2019-01-22 DIAGNOSIS — E782 Mixed hyperlipidemia: Secondary | ICD-10-CM | POA: Diagnosis not present

## 2019-01-22 DIAGNOSIS — E669 Obesity, unspecified: Secondary | ICD-10-CM | POA: Diagnosis not present

## 2019-01-22 DIAGNOSIS — F3341 Major depressive disorder, recurrent, in partial remission: Secondary | ICD-10-CM | POA: Diagnosis not present

## 2019-01-22 DIAGNOSIS — Z23 Encounter for immunization: Secondary | ICD-10-CM | POA: Diagnosis not present

## 2019-01-22 DIAGNOSIS — E118 Type 2 diabetes mellitus with unspecified complications: Secondary | ICD-10-CM | POA: Diagnosis not present

## 2019-01-22 DIAGNOSIS — Z0000 Encounter for general adult medical examination without abnormal findings: Secondary | ICD-10-CM | POA: Diagnosis not present

## 2019-01-22 DIAGNOSIS — Z79899 Other long term (current) drug therapy: Secondary | ICD-10-CM | POA: Diagnosis not present

## 2019-02-20 DIAGNOSIS — E782 Mixed hyperlipidemia: Secondary | ICD-10-CM | POA: Diagnosis not present

## 2019-02-20 DIAGNOSIS — I10 Essential (primary) hypertension: Secondary | ICD-10-CM | POA: Diagnosis not present

## 2019-02-20 DIAGNOSIS — E118 Type 2 diabetes mellitus with unspecified complications: Secondary | ICD-10-CM | POA: Diagnosis not present

## 2019-02-20 DIAGNOSIS — R8290 Unspecified abnormal findings in urine: Secondary | ICD-10-CM | POA: Diagnosis not present

## 2019-02-20 DIAGNOSIS — Z794 Long term (current) use of insulin: Secondary | ICD-10-CM | POA: Diagnosis not present

## 2019-02-20 DIAGNOSIS — Z79899 Other long term (current) drug therapy: Secondary | ICD-10-CM | POA: Diagnosis not present

## 2019-05-25 DIAGNOSIS — Z79899 Other long term (current) drug therapy: Secondary | ICD-10-CM | POA: Diagnosis not present

## 2019-05-25 DIAGNOSIS — E782 Mixed hyperlipidemia: Secondary | ICD-10-CM | POA: Diagnosis not present

## 2019-05-25 DIAGNOSIS — I10 Essential (primary) hypertension: Secondary | ICD-10-CM | POA: Diagnosis not present

## 2019-05-25 DIAGNOSIS — E118 Type 2 diabetes mellitus with unspecified complications: Secondary | ICD-10-CM | POA: Diagnosis not present

## 2019-05-25 DIAGNOSIS — J449 Chronic obstructive pulmonary disease, unspecified: Secondary | ICD-10-CM | POA: Diagnosis not present

## 2019-05-25 DIAGNOSIS — Z794 Long term (current) use of insulin: Secondary | ICD-10-CM | POA: Diagnosis not present

## 2019-06-04 DIAGNOSIS — E782 Mixed hyperlipidemia: Secondary | ICD-10-CM | POA: Diagnosis not present

## 2019-06-04 DIAGNOSIS — Z794 Long term (current) use of insulin: Secondary | ICD-10-CM | POA: Diagnosis not present

## 2019-06-04 DIAGNOSIS — E118 Type 2 diabetes mellitus with unspecified complications: Secondary | ICD-10-CM | POA: Diagnosis not present

## 2019-06-04 DIAGNOSIS — E119 Type 2 diabetes mellitus without complications: Secondary | ICD-10-CM | POA: Diagnosis not present

## 2019-06-04 DIAGNOSIS — E669 Obesity, unspecified: Secondary | ICD-10-CM | POA: Diagnosis not present

## 2019-06-04 DIAGNOSIS — I10 Essential (primary) hypertension: Secondary | ICD-10-CM | POA: Diagnosis not present

## 2019-08-25 DIAGNOSIS — E782 Mixed hyperlipidemia: Secondary | ICD-10-CM | POA: Diagnosis not present

## 2019-08-25 DIAGNOSIS — E118 Type 2 diabetes mellitus with unspecified complications: Secondary | ICD-10-CM | POA: Diagnosis not present

## 2019-08-25 DIAGNOSIS — I10 Essential (primary) hypertension: Secondary | ICD-10-CM | POA: Diagnosis not present

## 2019-08-25 DIAGNOSIS — Z794 Long term (current) use of insulin: Secondary | ICD-10-CM | POA: Diagnosis not present

## 2019-08-25 DIAGNOSIS — Z79899 Other long term (current) drug therapy: Secondary | ICD-10-CM | POA: Diagnosis not present

## 2019-09-01 DIAGNOSIS — Z794 Long term (current) use of insulin: Secondary | ICD-10-CM | POA: Diagnosis not present

## 2019-09-01 DIAGNOSIS — Z0000 Encounter for general adult medical examination without abnormal findings: Secondary | ICD-10-CM | POA: Diagnosis not present

## 2019-09-01 DIAGNOSIS — I10 Essential (primary) hypertension: Secondary | ICD-10-CM | POA: Diagnosis not present

## 2019-09-01 DIAGNOSIS — J449 Chronic obstructive pulmonary disease, unspecified: Secondary | ICD-10-CM | POA: Diagnosis not present

## 2019-09-01 DIAGNOSIS — E782 Mixed hyperlipidemia: Secondary | ICD-10-CM | POA: Diagnosis not present

## 2019-09-01 DIAGNOSIS — E118 Type 2 diabetes mellitus with unspecified complications: Secondary | ICD-10-CM | POA: Diagnosis not present

## 2019-12-02 ENCOUNTER — Other Ambulatory Visit: Payer: Self-pay | Admitting: Internal Medicine

## 2019-12-02 DIAGNOSIS — E119 Type 2 diabetes mellitus without complications: Secondary | ICD-10-CM | POA: Diagnosis not present

## 2019-12-02 DIAGNOSIS — Z79899 Other long term (current) drug therapy: Secondary | ICD-10-CM | POA: Diagnosis not present

## 2019-12-02 DIAGNOSIS — Z794 Long term (current) use of insulin: Secondary | ICD-10-CM | POA: Diagnosis not present

## 2019-12-02 DIAGNOSIS — R197 Diarrhea, unspecified: Secondary | ICD-10-CM | POA: Diagnosis not present

## 2019-12-02 DIAGNOSIS — E782 Mixed hyperlipidemia: Secondary | ICD-10-CM | POA: Diagnosis not present

## 2019-12-02 DIAGNOSIS — Z1231 Encounter for screening mammogram for malignant neoplasm of breast: Secondary | ICD-10-CM

## 2019-12-02 DIAGNOSIS — E669 Obesity, unspecified: Secondary | ICD-10-CM | POA: Diagnosis not present

## 2019-12-02 DIAGNOSIS — E118 Type 2 diabetes mellitus with unspecified complications: Secondary | ICD-10-CM | POA: Diagnosis not present

## 2019-12-02 DIAGNOSIS — I10 Essential (primary) hypertension: Secondary | ICD-10-CM | POA: Diagnosis not present

## 2020-02-09 DIAGNOSIS — R194 Change in bowel habit: Secondary | ICD-10-CM | POA: Diagnosis not present

## 2020-02-09 DIAGNOSIS — Z86010 Personal history of colonic polyps: Secondary | ICD-10-CM | POA: Diagnosis not present

## 2020-02-09 DIAGNOSIS — R1084 Generalized abdominal pain: Secondary | ICD-10-CM | POA: Diagnosis not present

## 2020-02-09 DIAGNOSIS — K5730 Diverticulosis of large intestine without perforation or abscess without bleeding: Secondary | ICD-10-CM | POA: Diagnosis not present

## 2020-02-09 DIAGNOSIS — R197 Diarrhea, unspecified: Secondary | ICD-10-CM | POA: Diagnosis not present

## 2020-02-16 DIAGNOSIS — E119 Type 2 diabetes mellitus without complications: Secondary | ICD-10-CM | POA: Diagnosis not present

## 2020-04-05 ENCOUNTER — Other Ambulatory Visit: Payer: Self-pay

## 2020-04-05 ENCOUNTER — Ambulatory Visit
Admission: RE | Admit: 2020-04-05 | Discharge: 2020-04-05 | Disposition: A | Discharge status: Home / Self Care | Payer: PPO | Source: Ambulatory Visit | Attending: Internal Medicine | Admitting: Internal Medicine

## 2020-04-05 DIAGNOSIS — Z1231 Encounter for screening mammogram for malignant neoplasm of breast: Secondary | ICD-10-CM | POA: Diagnosis not present

## 2020-05-12 DIAGNOSIS — R1084 Generalized abdominal pain: Secondary | ICD-10-CM | POA: Diagnosis not present

## 2020-05-12 DIAGNOSIS — I10 Essential (primary) hypertension: Secondary | ICD-10-CM | POA: Diagnosis not present

## 2020-05-12 DIAGNOSIS — Z794 Long term (current) use of insulin: Secondary | ICD-10-CM | POA: Diagnosis not present

## 2020-05-12 DIAGNOSIS — E118 Type 2 diabetes mellitus with unspecified complications: Secondary | ICD-10-CM | POA: Diagnosis not present

## 2020-05-12 DIAGNOSIS — R197 Diarrhea, unspecified: Secondary | ICD-10-CM | POA: Diagnosis not present

## 2020-05-12 DIAGNOSIS — R194 Change in bowel habit: Secondary | ICD-10-CM | POA: Diagnosis not present

## 2020-05-12 DIAGNOSIS — Z79899 Other long term (current) drug therapy: Secondary | ICD-10-CM | POA: Diagnosis not present

## 2020-05-12 DIAGNOSIS — E782 Mixed hyperlipidemia: Secondary | ICD-10-CM | POA: Diagnosis not present

## 2020-06-06 DIAGNOSIS — E782 Mixed hyperlipidemia: Secondary | ICD-10-CM | POA: Diagnosis not present

## 2020-06-06 DIAGNOSIS — E118 Type 2 diabetes mellitus with unspecified complications: Secondary | ICD-10-CM | POA: Diagnosis not present

## 2020-06-06 DIAGNOSIS — I10 Essential (primary) hypertension: Secondary | ICD-10-CM | POA: Diagnosis not present

## 2020-06-06 DIAGNOSIS — E669 Obesity, unspecified: Secondary | ICD-10-CM | POA: Diagnosis not present

## 2020-06-06 DIAGNOSIS — Z794 Long term (current) use of insulin: Secondary | ICD-10-CM | POA: Diagnosis not present

## 2020-07-26 DIAGNOSIS — Z86010 Personal history of colonic polyps: Secondary | ICD-10-CM | POA: Diagnosis not present

## 2020-07-26 DIAGNOSIS — R194 Change in bowel habit: Secondary | ICD-10-CM | POA: Diagnosis not present

## 2020-07-26 DIAGNOSIS — K5730 Diverticulosis of large intestine without perforation or abscess without bleeding: Secondary | ICD-10-CM | POA: Diagnosis not present

## 2020-07-26 DIAGNOSIS — R197 Diarrhea, unspecified: Secondary | ICD-10-CM | POA: Diagnosis not present

## 2020-08-24 DIAGNOSIS — Z79899 Other long term (current) drug therapy: Secondary | ICD-10-CM | POA: Diagnosis not present

## 2020-08-24 DIAGNOSIS — Z0000 Encounter for general adult medical examination without abnormal findings: Secondary | ICD-10-CM | POA: Diagnosis not present

## 2020-08-24 DIAGNOSIS — J449 Chronic obstructive pulmonary disease, unspecified: Secondary | ICD-10-CM | POA: Diagnosis not present

## 2020-08-24 DIAGNOSIS — E782 Mixed hyperlipidemia: Secondary | ICD-10-CM | POA: Diagnosis not present

## 2020-08-24 DIAGNOSIS — I10 Essential (primary) hypertension: Secondary | ICD-10-CM | POA: Diagnosis not present

## 2020-08-24 DIAGNOSIS — E118 Type 2 diabetes mellitus with unspecified complications: Secondary | ICD-10-CM | POA: Diagnosis not present

## 2020-08-24 DIAGNOSIS — R0602 Shortness of breath: Secondary | ICD-10-CM | POA: Diagnosis not present

## 2020-08-24 DIAGNOSIS — Z794 Long term (current) use of insulin: Secondary | ICD-10-CM | POA: Diagnosis not present

## 2020-08-25 DIAGNOSIS — R0602 Shortness of breath: Secondary | ICD-10-CM | POA: Diagnosis not present

## 2020-08-25 DIAGNOSIS — I517 Cardiomegaly: Secondary | ICD-10-CM | POA: Diagnosis not present

## 2020-09-15 DIAGNOSIS — R0602 Shortness of breath: Secondary | ICD-10-CM | POA: Diagnosis not present

## 2020-10-12 DIAGNOSIS — E782 Mixed hyperlipidemia: Secondary | ICD-10-CM | POA: Diagnosis not present

## 2020-10-12 DIAGNOSIS — I350 Nonrheumatic aortic (valve) stenosis: Secondary | ICD-10-CM | POA: Insufficient documentation

## 2020-10-12 DIAGNOSIS — R0602 Shortness of breath: Secondary | ICD-10-CM | POA: Diagnosis not present

## 2020-10-12 DIAGNOSIS — I10 Essential (primary) hypertension: Secondary | ICD-10-CM | POA: Diagnosis not present

## 2020-11-24 DIAGNOSIS — I10 Essential (primary) hypertension: Secondary | ICD-10-CM | POA: Diagnosis not present

## 2020-11-24 DIAGNOSIS — Z794 Long term (current) use of insulin: Secondary | ICD-10-CM | POA: Diagnosis not present

## 2020-11-24 DIAGNOSIS — E118 Type 2 diabetes mellitus with unspecified complications: Secondary | ICD-10-CM | POA: Diagnosis not present

## 2020-11-24 DIAGNOSIS — E782 Mixed hyperlipidemia: Secondary | ICD-10-CM | POA: Diagnosis not present

## 2020-11-24 DIAGNOSIS — Z79899 Other long term (current) drug therapy: Secondary | ICD-10-CM | POA: Diagnosis not present

## 2020-12-14 DIAGNOSIS — N1831 Chronic kidney disease, stage 3a: Secondary | ICD-10-CM | POA: Diagnosis not present

## 2020-12-14 DIAGNOSIS — E118 Type 2 diabetes mellitus with unspecified complications: Secondary | ICD-10-CM | POA: Diagnosis not present

## 2020-12-14 DIAGNOSIS — E1122 Type 2 diabetes mellitus with diabetic chronic kidney disease: Secondary | ICD-10-CM | POA: Diagnosis not present

## 2020-12-14 DIAGNOSIS — E1129 Type 2 diabetes mellitus with other diabetic kidney complication: Secondary | ICD-10-CM | POA: Diagnosis not present

## 2020-12-14 DIAGNOSIS — R809 Proteinuria, unspecified: Secondary | ICD-10-CM | POA: Diagnosis not present

## 2020-12-14 DIAGNOSIS — Z794 Long term (current) use of insulin: Secondary | ICD-10-CM | POA: Diagnosis not present

## 2021-01-16 DIAGNOSIS — E113393 Type 2 diabetes mellitus with moderate nonproliferative diabetic retinopathy without macular edema, bilateral: Secondary | ICD-10-CM | POA: Diagnosis not present

## 2021-02-23 DIAGNOSIS — E782 Mixed hyperlipidemia: Secondary | ICD-10-CM | POA: Diagnosis not present

## 2021-02-23 DIAGNOSIS — Z79899 Other long term (current) drug therapy: Secondary | ICD-10-CM | POA: Diagnosis not present

## 2021-02-23 DIAGNOSIS — E118 Type 2 diabetes mellitus with unspecified complications: Secondary | ICD-10-CM | POA: Diagnosis not present

## 2021-02-23 DIAGNOSIS — J449 Chronic obstructive pulmonary disease, unspecified: Secondary | ICD-10-CM | POA: Diagnosis not present

## 2021-02-23 DIAGNOSIS — Z1231 Encounter for screening mammogram for malignant neoplasm of breast: Secondary | ICD-10-CM | POA: Diagnosis not present

## 2021-02-23 DIAGNOSIS — I10 Essential (primary) hypertension: Secondary | ICD-10-CM | POA: Diagnosis not present

## 2021-02-23 DIAGNOSIS — Z794 Long term (current) use of insulin: Secondary | ICD-10-CM | POA: Diagnosis not present

## 2021-02-23 DIAGNOSIS — Z23 Encounter for immunization: Secondary | ICD-10-CM | POA: Diagnosis not present

## 2021-03-01 ENCOUNTER — Other Ambulatory Visit: Payer: Self-pay | Admitting: Internal Medicine

## 2021-03-01 DIAGNOSIS — Z1231 Encounter for screening mammogram for malignant neoplasm of breast: Secondary | ICD-10-CM

## 2021-03-29 DIAGNOSIS — Z794 Long term (current) use of insulin: Secondary | ICD-10-CM | POA: Diagnosis not present

## 2021-03-29 DIAGNOSIS — E782 Mixed hyperlipidemia: Secondary | ICD-10-CM | POA: Diagnosis not present

## 2021-03-29 DIAGNOSIS — I10 Essential (primary) hypertension: Secondary | ICD-10-CM | POA: Diagnosis not present

## 2021-03-29 DIAGNOSIS — Z79899 Other long term (current) drug therapy: Secondary | ICD-10-CM | POA: Diagnosis not present

## 2021-03-29 DIAGNOSIS — E118 Type 2 diabetes mellitus with unspecified complications: Secondary | ICD-10-CM | POA: Diagnosis not present

## 2021-03-30 ENCOUNTER — Other Ambulatory Visit: Payer: Self-pay | Admitting: Internal Medicine

## 2021-03-30 DIAGNOSIS — R3129 Other microscopic hematuria: Secondary | ICD-10-CM

## 2021-04-06 ENCOUNTER — Ambulatory Visit
Admission: RE | Admit: 2021-04-06 | Discharge: 2021-04-06 | Disposition: A | Discharge status: Home / Self Care | Payer: PPO | Source: Ambulatory Visit | Attending: Internal Medicine | Admitting: Internal Medicine

## 2021-04-06 ENCOUNTER — Other Ambulatory Visit: Payer: Self-pay

## 2021-04-06 DIAGNOSIS — Z1231 Encounter for screening mammogram for malignant neoplasm of breast: Secondary | ICD-10-CM | POA: Diagnosis not present

## 2021-04-13 DIAGNOSIS — I350 Nonrheumatic aortic (valve) stenosis: Secondary | ICD-10-CM | POA: Diagnosis not present

## 2021-04-13 DIAGNOSIS — Z720 Tobacco use: Secondary | ICD-10-CM | POA: Diagnosis not present

## 2021-04-13 DIAGNOSIS — Z794 Long term (current) use of insulin: Secondary | ICD-10-CM | POA: Diagnosis not present

## 2021-04-13 DIAGNOSIS — E669 Obesity, unspecified: Secondary | ICD-10-CM | POA: Diagnosis not present

## 2021-04-13 DIAGNOSIS — I10 Essential (primary) hypertension: Secondary | ICD-10-CM | POA: Diagnosis not present

## 2021-04-13 DIAGNOSIS — E118 Type 2 diabetes mellitus with unspecified complications: Secondary | ICD-10-CM | POA: Diagnosis not present

## 2021-04-13 DIAGNOSIS — E782 Mixed hyperlipidemia: Secondary | ICD-10-CM | POA: Diagnosis not present

## 2021-05-02 ENCOUNTER — Ambulatory Visit
Admission: RE | Admit: 2021-05-02 | Discharge: 2021-05-02 | Disposition: A | Discharge status: Home / Self Care | Payer: PPO | Source: Ambulatory Visit | Attending: Internal Medicine | Admitting: Internal Medicine

## 2021-05-02 ENCOUNTER — Other Ambulatory Visit: Payer: Self-pay

## 2021-05-02 DIAGNOSIS — R3129 Other microscopic hematuria: Secondary | ICD-10-CM | POA: Diagnosis not present

## 2021-05-16 DIAGNOSIS — R1319 Other dysphagia: Secondary | ICD-10-CM | POA: Diagnosis not present

## 2021-05-16 DIAGNOSIS — Z86010 Personal history of colonic polyps: Secondary | ICD-10-CM | POA: Diagnosis not present

## 2021-05-16 DIAGNOSIS — K5730 Diverticulosis of large intestine without perforation or abscess without bleeding: Secondary | ICD-10-CM | POA: Diagnosis not present

## 2021-05-16 DIAGNOSIS — K219 Gastro-esophageal reflux disease without esophagitis: Secondary | ICD-10-CM | POA: Diagnosis not present

## 2021-06-05 DIAGNOSIS — H25813 Combined forms of age-related cataract, bilateral: Secondary | ICD-10-CM | POA: Diagnosis not present

## 2021-06-13 DIAGNOSIS — H25013 Cortical age-related cataract, bilateral: Secondary | ICD-10-CM | POA: Diagnosis not present

## 2021-06-13 DIAGNOSIS — H2513 Age-related nuclear cataract, bilateral: Secondary | ICD-10-CM | POA: Diagnosis not present

## 2021-06-13 DIAGNOSIS — H25043 Posterior subcapsular polar age-related cataract, bilateral: Secondary | ICD-10-CM | POA: Diagnosis not present

## 2021-06-13 DIAGNOSIS — H18413 Arcus senilis, bilateral: Secondary | ICD-10-CM | POA: Diagnosis not present

## 2021-06-13 DIAGNOSIS — H2511 Age-related nuclear cataract, right eye: Secondary | ICD-10-CM | POA: Diagnosis not present

## 2021-06-26 DIAGNOSIS — J449 Chronic obstructive pulmonary disease, unspecified: Secondary | ICD-10-CM | POA: Diagnosis not present

## 2021-06-26 DIAGNOSIS — E118 Type 2 diabetes mellitus with unspecified complications: Secondary | ICD-10-CM | POA: Diagnosis not present

## 2021-06-26 DIAGNOSIS — Z79899 Other long term (current) drug therapy: Secondary | ICD-10-CM | POA: Diagnosis not present

## 2021-06-26 DIAGNOSIS — I10 Essential (primary) hypertension: Secondary | ICD-10-CM | POA: Diagnosis not present

## 2021-06-26 DIAGNOSIS — E782 Mixed hyperlipidemia: Secondary | ICD-10-CM | POA: Diagnosis not present

## 2021-06-26 DIAGNOSIS — N1831 Chronic kidney disease, stage 3a: Secondary | ICD-10-CM | POA: Diagnosis not present

## 2021-06-26 DIAGNOSIS — Z0000 Encounter for general adult medical examination without abnormal findings: Secondary | ICD-10-CM | POA: Diagnosis not present

## 2021-06-26 DIAGNOSIS — Z794 Long term (current) use of insulin: Secondary | ICD-10-CM | POA: Diagnosis not present

## 2021-06-26 DIAGNOSIS — E1122 Type 2 diabetes mellitus with diabetic chronic kidney disease: Secondary | ICD-10-CM | POA: Diagnosis not present

## 2021-08-07 DIAGNOSIS — H2511 Age-related nuclear cataract, right eye: Secondary | ICD-10-CM | POA: Diagnosis not present

## 2021-08-08 DIAGNOSIS — H2512 Age-related nuclear cataract, left eye: Secondary | ICD-10-CM | POA: Diagnosis not present

## 2021-09-11 DIAGNOSIS — H2512 Age-related nuclear cataract, left eye: Secondary | ICD-10-CM | POA: Diagnosis not present

## 2021-10-03 DIAGNOSIS — Z794 Long term (current) use of insulin: Secondary | ICD-10-CM | POA: Diagnosis not present

## 2021-10-03 DIAGNOSIS — E118 Type 2 diabetes mellitus with unspecified complications: Secondary | ICD-10-CM | POA: Diagnosis not present

## 2021-10-03 DIAGNOSIS — I10 Essential (primary) hypertension: Secondary | ICD-10-CM | POA: Diagnosis not present

## 2021-10-03 DIAGNOSIS — E782 Mixed hyperlipidemia: Secondary | ICD-10-CM | POA: Diagnosis not present

## 2021-10-03 DIAGNOSIS — Z79899 Other long term (current) drug therapy: Secondary | ICD-10-CM | POA: Diagnosis not present

## 2021-10-09 ENCOUNTER — Encounter: Payer: Self-pay | Admitting: Anesthesiology

## 2021-10-09 ENCOUNTER — Encounter
Admission: RE | Disposition: A | Discharge status: Home / Self Care | Payer: Self-pay | Source: Home / Self Care | Attending: Gastroenterology

## 2021-10-09 ENCOUNTER — Ambulatory Visit
Admission: RE | Admit: 2021-10-09 | Discharge: 2021-10-09 | Disposition: A | Discharge status: Home / Self Care | Payer: PPO | Attending: Gastroenterology | Admitting: Gastroenterology

## 2021-10-09 DIAGNOSIS — Z539 Procedure and treatment not carried out, unspecified reason: Secondary | ICD-10-CM | POA: Insufficient documentation

## 2021-10-09 HISTORY — DX: Other dysphagia: R13.19

## 2021-10-09 HISTORY — DX: Peripheral vascular disease, unspecified: I73.9

## 2021-10-09 HISTORY — DX: Gastro-esophageal reflux disease without esophagitis: K21.9

## 2021-10-09 HISTORY — DX: Benign neoplasm of colon, unspecified: D12.6

## 2021-10-09 HISTORY — DX: Unspecified osteoarthritis, unspecified site: M19.90

## 2021-10-09 HISTORY — DX: Unspecified asthma, uncomplicated: J45.909

## 2021-10-09 HISTORY — DX: Nonrheumatic aortic (valve) stenosis: I35.0

## 2021-10-09 HISTORY — DX: Morbid (severe) obesity due to excess calories: E66.01

## 2021-10-09 SURGERY — COLONOSCOPY WITH PROPOFOL
Anesthesia: General

## 2021-10-09 MED ORDER — SODIUM CHLORIDE 0.9 % IV SOLN: INTRAVENOUS | Status: DC

## 2021-11-13 DIAGNOSIS — E669 Obesity, unspecified: Secondary | ICD-10-CM | POA: Diagnosis not present

## 2021-11-13 DIAGNOSIS — I350 Nonrheumatic aortic (valve) stenosis: Secondary | ICD-10-CM | POA: Diagnosis not present

## 2021-11-13 DIAGNOSIS — E782 Mixed hyperlipidemia: Secondary | ICD-10-CM | POA: Diagnosis not present

## 2021-11-13 DIAGNOSIS — I10 Essential (primary) hypertension: Secondary | ICD-10-CM | POA: Diagnosis not present

## 2021-12-07 DIAGNOSIS — E782 Mixed hyperlipidemia: Secondary | ICD-10-CM | POA: Diagnosis not present

## 2021-12-07 DIAGNOSIS — Z79899 Other long term (current) drug therapy: Secondary | ICD-10-CM | POA: Diagnosis not present

## 2021-12-07 DIAGNOSIS — E118 Type 2 diabetes mellitus with unspecified complications: Secondary | ICD-10-CM | POA: Diagnosis not present

## 2021-12-07 DIAGNOSIS — N939 Abnormal uterine and vaginal bleeding, unspecified: Secondary | ICD-10-CM | POA: Diagnosis not present

## 2021-12-07 DIAGNOSIS — I10 Essential (primary) hypertension: Secondary | ICD-10-CM | POA: Diagnosis not present

## 2021-12-07 DIAGNOSIS — Z794 Long term (current) use of insulin: Secondary | ICD-10-CM | POA: Diagnosis not present

## 2021-12-07 DIAGNOSIS — E669 Obesity, unspecified: Secondary | ICD-10-CM | POA: Diagnosis not present

## 2021-12-28 DIAGNOSIS — N888 Other specified noninflammatory disorders of cervix uteri: Secondary | ICD-10-CM | POA: Diagnosis not present

## 2021-12-28 DIAGNOSIS — R87619 Unspecified abnormal cytological findings in specimens from cervix uteri: Secondary | ICD-10-CM | POA: Diagnosis not present

## 2021-12-28 DIAGNOSIS — C541 Malignant neoplasm of endometrium: Secondary | ICD-10-CM | POA: Diagnosis not present

## 2021-12-28 DIAGNOSIS — R87611 Atypical squamous cells cannot exclude high grade squamous intraepithelial lesion on cytologic smear of cervix (ASC-H): Secondary | ICD-10-CM | POA: Diagnosis not present

## 2021-12-28 DIAGNOSIS — N950 Postmenopausal bleeding: Secondary | ICD-10-CM | POA: Diagnosis not present

## 2022-01-09 ENCOUNTER — Ambulatory Visit: Payer: PPO | Admitting: Anesthesiology

## 2022-01-09 ENCOUNTER — Telehealth: Payer: Self-pay

## 2022-01-09 ENCOUNTER — Encounter: Payer: Self-pay | Admitting: Obstetrics and Gynecology

## 2022-01-09 ENCOUNTER — Ambulatory Visit
Admission: RE | Admit: 2022-01-09 | Discharge: 2022-01-09 | Disposition: A | Discharge status: Home / Self Care | Payer: PPO | Attending: Gastroenterology | Admitting: Gastroenterology

## 2022-01-09 ENCOUNTER — Encounter: Payer: Self-pay | Admitting: Anesthesiology

## 2022-01-09 ENCOUNTER — Encounter
Admission: RE | Disposition: A | Discharge status: Home / Self Care | Payer: Self-pay | Source: Home / Self Care | Attending: Gastroenterology

## 2022-01-09 DIAGNOSIS — Z87891 Personal history of nicotine dependence: Secondary | ICD-10-CM | POA: Diagnosis not present

## 2022-01-09 DIAGNOSIS — Z86010 Personal history of colonic polyps: Secondary | ICD-10-CM | POA: Diagnosis not present

## 2022-01-09 DIAGNOSIS — K2289 Other specified disease of esophagus: Secondary | ICD-10-CM | POA: Insufficient documentation

## 2022-01-09 DIAGNOSIS — I10 Essential (primary) hypertension: Secondary | ICD-10-CM | POA: Insufficient documentation

## 2022-01-09 DIAGNOSIS — G4733 Obstructive sleep apnea (adult) (pediatric): Secondary | ICD-10-CM | POA: Diagnosis not present

## 2022-01-09 DIAGNOSIS — G4730 Sleep apnea, unspecified: Secondary | ICD-10-CM | POA: Diagnosis not present

## 2022-01-09 DIAGNOSIS — K317 Polyp of stomach and duodenum: Secondary | ICD-10-CM | POA: Diagnosis not present

## 2022-01-09 DIAGNOSIS — Z794 Long term (current) use of insulin: Secondary | ICD-10-CM | POA: Insufficient documentation

## 2022-01-09 DIAGNOSIS — D131 Benign neoplasm of stomach: Secondary | ICD-10-CM | POA: Diagnosis not present

## 2022-01-09 DIAGNOSIS — E119 Type 2 diabetes mellitus without complications: Secondary | ICD-10-CM | POA: Diagnosis not present

## 2022-01-09 DIAGNOSIS — K3189 Other diseases of stomach and duodenum: Secondary | ICD-10-CM | POA: Insufficient documentation

## 2022-01-09 DIAGNOSIS — Z6835 Body mass index (BMI) 35.0-35.9, adult: Secondary | ICD-10-CM | POA: Diagnosis not present

## 2022-01-09 DIAGNOSIS — R1310 Dysphagia, unspecified: Secondary | ICD-10-CM | POA: Diagnosis not present

## 2022-01-09 DIAGNOSIS — K2100 Gastro-esophageal reflux disease with esophagitis, without bleeding: Secondary | ICD-10-CM | POA: Diagnosis not present

## 2022-01-09 DIAGNOSIS — Z7984 Long term (current) use of oral hypoglycemic drugs: Secondary | ICD-10-CM | POA: Insufficient documentation

## 2022-01-09 DIAGNOSIS — R1319 Other dysphagia: Secondary | ICD-10-CM | POA: Diagnosis not present

## 2022-01-09 DIAGNOSIS — K219 Gastro-esophageal reflux disease without esophagitis: Secondary | ICD-10-CM | POA: Insufficient documentation

## 2022-01-09 DIAGNOSIS — K449 Diaphragmatic hernia without obstruction or gangrene: Secondary | ICD-10-CM | POA: Insufficient documentation

## 2022-01-09 DIAGNOSIS — K2281 Esophageal polyp: Secondary | ICD-10-CM | POA: Insufficient documentation

## 2022-01-09 DIAGNOSIS — J449 Chronic obstructive pulmonary disease, unspecified: Secondary | ICD-10-CM | POA: Insufficient documentation

## 2022-01-09 HISTORY — PX: ESOPHAGOGASTRODUODENOSCOPY: SHX5428

## 2022-01-09 LAB — GLUCOSE, CAPILLARY
Glucose-Capillary: 128 mg/dL — ABNORMAL HIGH (ref 70–99)
Glucose-Capillary: 131 mg/dL — ABNORMAL HIGH (ref 70–99)

## 2022-01-09 SURGERY — EGD (ESOPHAGOGASTRODUODENOSCOPY)
Anesthesia: General

## 2022-01-09 MED ORDER — LIDOCAINE HCL (PF) 2 % IJ SOLN
INTRAMUSCULAR | Status: AC
  Filled 2022-01-09: qty 5

## 2022-01-09 MED ORDER — PROPOFOL 1000 MG/100ML IV EMUL
INTRAVENOUS | Status: AC
  Filled 2022-01-09: qty 100

## 2022-01-09 MED ORDER — IPRATROPIUM-ALBUTEROL 0.5-2.5 (3) MG/3ML IN SOLN
RESPIRATORY_TRACT | Status: AC
  Filled 2022-01-09: qty 3

## 2022-01-09 MED ORDER — IPRATROPIUM-ALBUTEROL 0.5-2.5 (3) MG/3ML IN SOLN
3.0000 mL | Freq: Once | RESPIRATORY_TRACT | Status: AC
  Administered 2022-01-09: 3 mL via RESPIRATORY_TRACT

## 2022-01-09 MED ORDER — PROPOFOL 500 MG/50ML IV EMUL
INTRAVENOUS | Status: DC | PRN
  Administered 2022-01-09: 120 ug/kg/min via INTRAVENOUS

## 2022-01-09 MED ORDER — SODIUM CHLORIDE 0.9 % IV SOLN
INTRAVENOUS | Status: DC
  Administered 2022-01-09: 1000 mL via INTRAVENOUS

## 2022-01-09 MED ORDER — LIDOCAINE HCL (CARDIAC) PF 100 MG/5ML IV SOSY
PREFILLED_SYRINGE | INTRAVENOUS | Status: DC | PRN
  Administered 2022-01-09: 50 mg via INTRAVENOUS

## 2022-01-09 MED ORDER — HYDRALAZINE HCL 20 MG/ML IJ SOLN
INTRAMUSCULAR | Status: DC | PRN
  Administered 2022-01-09: 10 mg via INTRAVENOUS

## 2022-01-09 NOTE — Interval H&P Note (Signed)
History and Physical Interval Note:  01/09/2022 12:07 PM  Terri [name]  has presented today for surgery, with the diagnosis of Hx of adenomatous polyp of colon (Z86.010) Gastroesophageal reflux disease, unspecified whether esophagitis present (K21.9) Esophageal dysphagia (R13.19).  The various methods of treatment have been discussed with the patient and family. After consideration of risks, benefits and other options for treatment, the patient has consented to  Procedure(s): COLONOSCOPY (N/A) ESOPHAGOGASTRODUODENOSCOPY (EGD) (N/A) as a surgical intervention.  The patient's history has been reviewed, patient examined, no change in status, stable for surgery.  I have reviewed the patient's chart and labs.  Questions were answered to the patient's satisfaction.     [name]  Ok to proceed with EGD/Colonoscopy

## 2022-01-09 NOTE — Transfer of Care (Signed)
Immediate Anesthesia Transfer of Care Note  Patient: Terri [name]  Procedure(s) Performed: COLONOSCOPY ESOPHAGOGASTRODUODENOSCOPY (EGD)  Patient Location: PACU  Anesthesia Type:General  Level of Consciousness: awake and sedated  Airway & Oxygen Therapy: Patient Spontanous Breathing and Patient connected to face mask oxygen  Post-op Assessment: Report given to RN and Post -op Vital signs reviewed and stable  Post vital signs: Reviewed and stable  Last Vitals:  Vitals Value Taken Time  BP    Temp    Pulse    Resp    SpO2      Last Pain:  Vitals:   01/09/22 1139  TempSrc: Temporal  PainSc: 0-No pain         Complications: No notable events documented.

## 2022-01-09 NOTE — Brief Op Note (Signed)
01/09/2022  12:48 PM  Please note, colonoscopy not performed as solid stool was present on rectal exam so procedure was not even started. Only the EGD was completed.

## 2022-01-09 NOTE — Telephone Encounter (Signed)
New patient scheduler spoke with Terri [name]. She was offered appointment for 9/13 and due to transportation with [facility] she will not be able to take this appointment with short notice. Appointment arranged for 01/17/22.

## 2022-01-09 NOTE — Telephone Encounter (Signed)
Call placed to [name] to schedule consult with gyn oncology. No answer, left voicemail to return call. Noted she has a colonoscopy today. Appointment tentatively scheduled for 01/10/22.

## 2022-01-09 NOTE — Anesthesia Preprocedure Evaluation (Addendum)
Anesthesia Evaluation  Patient identified by MRN, date of birth, ID band Patient awake    Reviewed: Allergy & Precautions, NPO status , Patient's Chart, lab work & pertinent test results, reviewed documented beta blocker date and time   Airway Mallampati: III  TM Distance: >3 FB Neck ROM: full    Dental  (+) Chipped, Poor Dentition, Missing   Pulmonary shortness of breath and with exertion, asthma , sleep apnea and Continuous Positive Airway Pressure Ventilation , COPD, former smoker,    Pulmonary exam normal        Cardiovascular Exercise Tolerance: Poor hypertension, Pt. on home beta blockers and Pt. on medications + DOE  Normal cardiovascular exam+ Valvular Problems/Murmurs AS      Neuro/Psych PSYCHIATRIC DISORDERS negative neurological ROS     GI/Hepatic Neg liver ROS, GERD  ,  Endo/Other  diabetes, Insulin Dependent, Oral Hypoglycemic Agents  Renal/GU negative Renal ROS  negative genitourinary   Musculoskeletal   Abdominal (+) + obese,   Peds  Hematology negative hematology ROS (+)   Anesthesia Other Findings Past Medical History: No date: Adenomatous colon polyp No date: Aortic stenosis No date: Arthritis No date: Asthma No date: Claudication [facility]) No date: COPD (chronic obstructive pulmonary disease) ([facility]) No date: Depression No date: Diabetes mellitus without complication ([facility]) No date: Esophageal dysphagia No date: GERD (gastroesophageal reflux disease) No date: Hyperlipemia No date: Hypertension No date: Obesity No date: Severe obesity (BMI 35.0-35.9 with comorbidity) ([place])  Past Surgical History: No date: BLADDER SURGERY 08/05/2015: COLONOSCOPY WITH PROPOFOL; N/A     Comment:  Procedure: COLONOSCOPY WITH PROPOFOL;  Surgeon: Josefine Class, MD;  Location: [facility] ENDOSCOPY;  Service:               Endoscopy;  Laterality: N/A; No date: FOOT SURGERY; Left 03/26/2017: ORIF ANKLE  FRACTURE; Left     Comment:  Procedure: OPEN REDUCTION INTERNAL FIXATION (ORIF) ANKLE              FRACTURE;  Surgeon: Corky Mull, MD;  Location: [facility]               ORS;  Service: Orthopedics;  Laterality: Left;     Reproductive/Obstetrics negative OB ROS                           Anesthesia Physical Anesthesia Plan  ASA: 3  Anesthesia Plan: General   Post-op Pain Management: Minimal or no pain anticipated   Induction:   PONV Risk Score and Plan: Propofol infusion and TIVA  Airway Management Planned: Natural Airway and Nasal CPAP  Additional Equipment:   Intra-op Plan:   Post-operative Plan:   Informed Consent:     Dental Advisory Given  Plan Discussed with: Anesthesiologist, CRNA and Surgeon  Anesthesia Plan Comments: (Pt tachypnea after changing clothes. Her BP is elevated. She denies signs of end-organ injury such as nuero changes, CP, vision changes. She states she is in her normal state of health. She held her blood pressure medications. Review of her previous colonoscopy revealed admission for respiratory distress needing nebulizer and blood pressure management. She denies recent COPD exacerbations. Risks were discussed with patient and she wants to proceed. Her blood pressure will be managed in the case. Pre-op duoneb administered. )       Anesthesia Quick Evaluation

## 2022-01-09 NOTE — Op Note (Signed)
[facility] Gastroenterology Patient Name: Terri [name] Procedure Date: 01/09/2022 11:10 AM MRN: [id number] Account #: [id number] Date of Birth: 25-Nov-1950 Admit Type: Outpatient Age: 71 Room: [facility] ENDO ROOM 1 Gender: Female Note Status: Finalized Instrument Name: Upper Endoscope [id number] Procedure:             Upper GI endoscopy Indications:           Dysphagia, Gastro-esophageal reflux disease Providers:             Andrey Farmer MD, MD Referring MD:          Leonie Douglas. Doy Hutching, MD (Referring MD) Medicines:             Monitored Anesthesia Care Complications:         No immediate complications. Estimated blood loss:                         Minimal. Procedure:             Pre-Anesthesia Assessment:                        - Prior to the procedure, a History and Physical was                         performed, and patient medications and allergies were                         reviewed. The patient is competent. The risks and                         benefits of the procedure and the sedation options and                         risks were discussed with the patient. All questions                         were answered and informed consent was obtained.                         Patient identification and proposed procedure were                         verified by the physician, the nurse, the                         anesthesiologist, the anesthetist and the technician                         in the endoscopy suite. Mental Status Examination:                         alert and oriented. Airway Examination: normal                         oropharyngeal airway and neck mobility. Respiratory                         Examination: clear to auscultation. CV Examination:  normal. Prophylactic Antibiotics: The patient does not                         require prophylactic antibiotics. Prior                         Anticoagulants: The patient has taken no  previous                         anticoagulant or antiplatelet agents. ASA Grade                         Assessment: III - A patient with severe systemic                         disease. After reviewing the risks and benefits, the                         patient was deemed in satisfactory condition to                         undergo the procedure. The anesthesia plan was to use                         monitored anesthesia care (MAC). Immediately prior to                         administration of medications, the patient was                         re-assessed for adequacy to receive sedatives. The                         heart rate, respiratory rate, oxygen saturations,                         blood pressure, adequacy of pulmonary ventilation, and                         response to care were monitored throughout the                         procedure. The physical status of the patient was                         re-assessed after the procedure.                        After obtaining informed consent, the endoscope was                         passed under direct vision. Throughout the procedure,                         the patient's blood pressure, pulse, and oxygen                         saturations were monitored continuously. The Endoscope  was introduced through the mouth, and advanced to the                         second part of duodenum. The upper GI endoscopy was                         accomplished without difficulty. The patient tolerated                         the procedure well. Findings:      A single 1 mm polyp with no bleeding was found. Biopsies were taken with       a cold forceps for histology. Estimated blood loss was minimal.      A 3 cm hiatal hernia was present.      A single 3 mm papule (nodule) with no bleeding and no stigmata of recent       bleeding was found in the gastric antrum. Biopsies were taken with a       cold forceps for  histology. Estimated blood loss was minimal.      The exam of the stomach was otherwise normal.      The examined duodenum was normal. Impression:            - Esophageal polyp(s) were found. Biopsied.                        - 3 cm hiatal hernia.                        - A single papule (nodule) found in the stomach.                         Biopsied.                        - Normal examined duodenum. Recommendation:        - Discharge patient to home.                        - Resume previous diet.                        - Continue present medications.                        - Await pathology results.                        - Return to referring physician as previously                         scheduled. Procedure Code(s):     --- Professional ---                        [phone], Esophagogastroduodenoscopy, flexible,                         transoral; with biopsy, single or multiple Diagnosis Code(s):     --- Professional ---                        K22.8,  Other specified diseases of esophagus                        K44.9, Diaphragmatic hernia without obstruction or                         gangrene                        K31.89, Other diseases of stomach and duodenum                        R13.10, Dysphagia, unspecified                        K21.9, Gastro-esophageal reflux disease without                         esophagitis CPT copyright 2019 American Medical Association. All rights reserved. The codes documented in this report are preliminary and upon coder review may  be revised to meet current compliance requirements. Andrey Farmer MD, MD 01/09/2022 12:39:48 PM Number of Addenda: 0 Note Initiated On: 01/09/2022 11:10 AM Estimated Blood Loss:  Estimated blood loss was minimal.      [facility]

## 2022-01-09 NOTE — Anesthesia Procedure Notes (Signed)
Date/Time: 01/09/2022 12:24 PM  Performed by: Donalda Ewings, CindyPre-anesthesia Checklist: Patient identified, Emergency Drugs available, Suction available, Patient being monitored and Timeout performed Patient Re-evaluated:Patient Re-evaluated prior to induction Oxygen Delivery Method: Supernova nasal CPAP Preoxygenation: Pre-oxygenation with 100% oxygen Induction Type: IV induction Airway Equipment and Method: Bite block Placement Confirmation: positive ETCO2 and CO2 detector

## 2022-01-09 NOTE — H&P (Signed)
Outpatient short stay form Pre-procedure 01/09/2022  [name], MD  Primary Physician: Idelle Crouch, MD  Reason for visit:  GERD/Surveillance colonoscopy  History of present illness:    71 y/o lady with history of obesity, hypertension, and DM II here for EGD for GERD and colonoscopy for surveillance. Last colonoscopy in 2017 with 5 mm TA.  No significant abdominal surgeries. No blood thinners. No family history of GI malignancies.    Current Facility-Administered Medications:    0.9 %  sodium chloride infusion, , Intravenous, Continuous, Brityn Mastrogiovanni, Hilton Cork, MD, Last Rate: 20 mL/hr at 01/09/22 1154, 1,000 mL at 01/09/22 1154   ipratropium-albuterol (DUONEB) 0.5-2.5 (3) MG/3ML nebulizer solution, , , ,   Medications Prior to Admission  Medication Sig Dispense Refill Last Dose   amLODipine (NORVASC) 10 MG tablet Take 10 mg by mouth daily.   Past Week   aspirin EC 81 MG tablet Take 81 mg by mouth daily.   01/08/2022   atenolol (TENORMIN) 25 MG tablet Take 25 mg by mouth daily.   01/08/2022   atorvastatin (LIPITOR) 80 MG tablet Take 80 mg by mouth daily.   Past Week   buPROPion (WELLBUTRIN XL) 300 MG 24 hr tablet Take 300 mg by mouth daily.   01/08/2022   cholecalciferol (VITAMIN D) 400 units TABS tablet Take 400 Units by mouth daily.   01/08/2022   insulin aspart (NOVOLOG) 100 UNIT/ML injection Inject 13 Units into the skin 3 (three) times daily before meals.   01/09/2022 at 0730   insulin glargine (LANTUS) 100 UNIT/ML injection Inject 25 Units into the skin 2 (two) times daily.   01/09/2022 at 0730   losartan-hydrochlorothiazide (HYZAAR) 100-25 MG tablet Take 1 tablet by mouth daily.   01/08/2022   metFORMIN (GLUCOPHAGE) 500 MG tablet Take 1,000 mg by mouth 2 (two) times daily with a meal.   01/08/2022   montelukast (SINGULAIR) 10 MG tablet Take 10 mg by mouth at bedtime.   01/08/2022   albuterol (PROVENTIL HFA;VENTOLIN HFA) 108 (90 Base) MCG/ACT inhaler Inhale 2 puffs into the  lungs every 6 (six) hours as needed for wheezing or shortness of breath.    at prn   enoxaparin (LOVENOX) 40 MG/0.4ML injection Inject 0.4 mLs (40 mg total) into the skin daily. (Patient not taking: Reported on 01/09/2022) 14 Syringe 0 Not Taking   oxyCODONE (OXY IR/ROXICODONE) 5 MG immediate release tablet Take 1 tablet (5 mg total) by mouth every 4 (four) hours as needed for moderate pain ((score 4 to 6)). (Patient not taking: Reported on 01/09/2022) 40 tablet 0 Completed Course     No Known Allergies   Past Medical History:  Diagnosis Date   Adenomatous colon polyp    Aortic stenosis    Arthritis    Asthma    Claudication (HCC)    COPD (chronic obstructive pulmonary disease) ([place])    Depression    Diabetes mellitus without complication (HCC)    Esophageal dysphagia    GERD (gastroesophageal reflux disease)    Hyperlipemia    Hypertension    Obesity    Severe obesity (BMI 35.0-35.9 with comorbidity) ([place])     Review of systems:  Otherwise negative.    Physical Exam  Gen: Alert, oriented. Appears stated age.  HEENT: PERRLA. Lungs: No respiratory distress CV: RRR Abd: soft, benign, no masses Ext: No edema    Planned procedures: Proceed with EGD/colonoscopy. The patient understands the nature of the planned procedure, indications, risks, alternatives and potential complications  including but not limited to bleeding, infection, perforation, damage to internal organs and possible oversedation/side effects from anesthesia. The patient agrees and gives consent to proceed.  Please refer to procedure notes for findings, recommendations and patient disposition/instructions.     [name], MD [facility] Gastroenterology

## 2022-01-10 ENCOUNTER — Encounter: Payer: Self-pay | Admitting: Gastroenterology

## 2022-01-10 ENCOUNTER — Inpatient Hospital Stay: Payer: PPO

## 2022-01-10 LAB — SURGICAL PATHOLOGY

## 2022-01-10 NOTE — Anesthesia Postprocedure Evaluation (Signed)
Anesthesia Post Note  Patient: Terri [name]  Procedure(s) Performed: COLONOSCOPY ESOPHAGOGASTRODUODENOSCOPY (EGD)  Patient location during evaluation: Endoscopy Anesthesia Type: General Level of consciousness: awake and alert Pain management: pain level controlled Vital Signs Assessment: post-procedure vital signs reviewed and stable Respiratory status: spontaneous breathing, nonlabored ventilation and respiratory function stable Cardiovascular status: blood pressure returned to baseline and stable Postop Assessment: no apparent nausea or vomiting Anesthetic complications: no   No notable events documented.   Last Vitals:  Vitals:   01/09/22 1248 01/09/22 1258  BP: (!) 203/84 (!) 222/90  Pulse: 79 79  Resp: (!) 22 (!) 21  Temp:    SpO2: 99% 100%    Last Pain:  Vitals:   01/09/22 1258  TempSrc:   PainSc: 0-No pain                 [place] Ouch

## 2022-01-16 DIAGNOSIS — M25562 Pain in left knee: Secondary | ICD-10-CM | POA: Diagnosis not present

## 2022-01-16 DIAGNOSIS — Z23 Encounter for immunization: Secondary | ICD-10-CM | POA: Diagnosis not present

## 2022-01-16 DIAGNOSIS — W108XXA Fall (on) (from) other stairs and steps, initial encounter: Secondary | ICD-10-CM | POA: Diagnosis not present

## 2022-01-16 DIAGNOSIS — M5489 Other dorsalgia: Secondary | ICD-10-CM | POA: Diagnosis not present

## 2022-01-16 DIAGNOSIS — M546 Pain in thoracic spine: Secondary | ICD-10-CM | POA: Diagnosis not present

## 2022-01-16 DIAGNOSIS — Y92009 Unspecified place in unspecified non-institutional (private) residence as the place of occurrence of the external cause: Secondary | ICD-10-CM | POA: Diagnosis not present

## 2022-01-17 ENCOUNTER — Inpatient Hospital Stay: Payer: PPO

## 2022-01-24 ENCOUNTER — Inpatient Hospital Stay: Payer: PPO | Attending: Obstetrics and Gynecology | Admitting: Obstetrics and Gynecology

## 2022-01-24 VITALS — BP 210/79 | HR 62 | Temp 97.8°F | Resp 20 | Wt 207.6 lb

## 2022-01-24 DIAGNOSIS — R32 Unspecified urinary incontinence: Secondary | ICD-10-CM | POA: Diagnosis not present

## 2022-01-24 DIAGNOSIS — K219 Gastro-esophageal reflux disease without esophagitis: Secondary | ICD-10-CM | POA: Diagnosis not present

## 2022-01-24 DIAGNOSIS — Z7982 Long term (current) use of aspirin: Secondary | ICD-10-CM | POA: Diagnosis not present

## 2022-01-24 DIAGNOSIS — I10 Essential (primary) hypertension: Secondary | ICD-10-CM | POA: Diagnosis not present

## 2022-01-24 DIAGNOSIS — E1151 Type 2 diabetes mellitus with diabetic peripheral angiopathy without gangrene: Secondary | ICD-10-CM | POA: Insufficient documentation

## 2022-01-24 DIAGNOSIS — I350 Nonrheumatic aortic (valve) stenosis: Secondary | ICD-10-CM | POA: Insufficient documentation

## 2022-01-24 DIAGNOSIS — J45909 Unspecified asthma, uncomplicated: Secondary | ICD-10-CM | POA: Insufficient documentation

## 2022-01-24 DIAGNOSIS — Z794 Long term (current) use of insulin: Secondary | ICD-10-CM | POA: Diagnosis not present

## 2022-01-24 DIAGNOSIS — R635 Abnormal weight gain: Secondary | ICD-10-CM | POA: Diagnosis not present

## 2022-01-24 DIAGNOSIS — J449 Chronic obstructive pulmonary disease, unspecified: Secondary | ICD-10-CM | POA: Insufficient documentation

## 2022-01-24 DIAGNOSIS — Z800 Family history of malignant neoplasm of digestive organs: Secondary | ICD-10-CM | POA: Diagnosis not present

## 2022-01-24 DIAGNOSIS — M1990 Unspecified osteoarthritis, unspecified site: Secondary | ICD-10-CM | POA: Diagnosis not present

## 2022-01-24 DIAGNOSIS — E785 Hyperlipidemia, unspecified: Secondary | ICD-10-CM | POA: Diagnosis not present

## 2022-01-24 DIAGNOSIS — Z7984 Long term (current) use of oral hypoglycemic drugs: Secondary | ICD-10-CM | POA: Diagnosis not present

## 2022-01-24 DIAGNOSIS — K635 Polyp of colon: Secondary | ICD-10-CM | POA: Diagnosis not present

## 2022-01-24 DIAGNOSIS — Z79899 Other long term (current) drug therapy: Secondary | ICD-10-CM | POA: Diagnosis not present

## 2022-01-24 DIAGNOSIS — C541 Malignant neoplasm of endometrium: Secondary | ICD-10-CM | POA: Insufficient documentation

## 2022-01-24 DIAGNOSIS — Z87891 Personal history of nicotine dependence: Secondary | ICD-10-CM | POA: Diagnosis not present

## 2022-01-24 DIAGNOSIS — C579 Malignant neoplasm of female genital organ, unspecified: Secondary | ICD-10-CM

## 2022-01-24 DIAGNOSIS — Z6835 Body mass index (BMI) 35.0-35.9, adult: Secondary | ICD-10-CM | POA: Insufficient documentation

## 2022-01-24 NOTE — Progress Notes (Signed)
Gynecologic Oncology Consult Visit   Referring Provider: Dr. Glennon Mac  Chief Complaint: Endometrial Cancer  Subjective:  Terri [name] is a 71 y.o. female who is seen in consultation from Dr. Glennon Mac for new diagnosis of endometrial cancer.  She was referred by Dr. Doy Hutching to Dr. Glennon Mac for reports of postmenopausal bleeding for the past 6-7 months, may be more.  Scant.  Became heavier. Additionally has urinary incontinence.  Reports weight gain.   On exam, friable cervical vs vaginal mass was partially visualized though exam was limited. Biopsy was performed.   Cervical Biopsy: poorly differentiated carcinoma, favor endometrial origin. Positive for p53 and ER. Focal staining for p16, negative for napsin and p63.   Colonoscopy.   Aunt had esophageal cancer.    Problem List: Patient Active Problem List   Diagnosis Date Noted   Ankle fracture, left 03/26/2017   Acute respiratory failure with hypoxia ([place]) 08/05/2015    Past Medical History: Past Medical History:  Diagnosis Date   Adenomatous colon polyp    Aortic stenosis    Arthritis    Asthma    Claudication (HCC)    COPD (chronic obstructive pulmonary disease) ([place])    Depression    Diabetes mellitus without complication (HCC)    Esophageal dysphagia    GERD (gastroesophageal reflux disease)    Hyperlipemia    Hypertension    Obesity    Severe obesity (BMI 35.0-35.9 with comorbidity) ([place])     Past Surgical History: Past Surgical History:  Procedure Laterality Date   BLADDER SURGERY     COLONOSCOPY WITH PROPOFOL N/A 08/05/2015   Procedure: COLONOSCOPY WITH PROPOFOL;  Surgeon: Josefine Class, MD;  Location: [facility] ENDOSCOPY;  Service: Endoscopy;  Laterality: N/A;   ESOPHAGOGASTRODUODENOSCOPY N/A 01/09/2022   Procedure: ESOPHAGOGASTRODUODENOSCOPY (EGD);  Surgeon: [name], MD;  Location: [facility] ENDOSCOPY;  Service: Endoscopy;  Laterality: N/A;   FOOT SURGERY Left    FRACTURE SURGERY     ORIF ANKLE FRACTURE  Left 03/26/2017   Procedure: OPEN REDUCTION INTERNAL FIXATION (ORIF) ANKLE FRACTURE;  Surgeon: Corky Mull, MD;  Location: [facility] ORS;  Service: Orthopedics;  Laterality: Left;    OB History:  G17P0 OB History  No obstetric history on file.    Family History: Family History  Problem Relation Age of Onset   CVA Mother    Diabetes Mother    CAD Father    Breast cancer Neg Hx     Social History: Social History   Socioeconomic History   Marital status: Single    Spouse name: Not on file   Number of children: Not on file   Years of education: Not on file   Highest education level: Not on file  Occupational History   Not on file  Tobacco Use   Smoking status: Former    Packs/day: 1.00    Years: 40.00    Total pack years: 40.00    Types: Cigarettes   Smokeless tobacco: Never  Vaping Use   Vaping Use: Never used  Substance and Sexual Activity   Alcohol use: No   Drug use: No   Sexual activity: Not on file  Other Topics Concern   Not on file  Social History Narrative   Not on file   Social Determinants of Health   Financial Resource Strain: Not on file  Food Insecurity: Not on file  Transportation Needs: Not on file  Physical Activity: Not on file  Stress: Not on file  Social Connections: Not on file  Intimate Partner Violence: Not on file    Allergies: No Known Allergies  Current Medications: Current Outpatient Medications  Medication Sig Dispense Refill   albuterol (PROVENTIL HFA;VENTOLIN HFA) 108 (90 Base) MCG/ACT inhaler Inhale 2 puffs into the lungs every 6 (six) hours as needed for wheezing or shortness of breath.     amLODipine (NORVASC) 10 MG tablet Take 10 mg by mouth daily.     aspirin EC 81 MG tablet Take 81 mg by mouth daily.     atenolol (TENORMIN) 25 MG tablet Take 25 mg by mouth daily.     atorvastatin (LIPITOR) 80 MG tablet Take 80 mg by mouth daily.     buPROPion (WELLBUTRIN XL) 300 MG 24 hr tablet Take 300 mg by mouth daily.      cholecalciferol (VITAMIN D) 400 units TABS tablet Take 400 Units by mouth daily.     enoxaparin (LOVENOX) 40 MG/0.4ML injection Inject 0.4 mLs (40 mg total) into the skin daily. (Patient not taking: Reported on 01/09/2022) 14 Syringe 0   insulin aspart (NOVOLOG) 100 UNIT/ML injection Inject 13 Units into the skin 3 (three) times daily before meals.     insulin glargine (LANTUS) 100 UNIT/ML injection Inject 25 Units into the skin 2 (two) times daily.     losartan-hydrochlorothiazide (HYZAAR) 100-25 MG tablet Take 1 tablet by mouth daily.     metFORMIN (GLUCOPHAGE) 500 MG tablet Take 1,000 mg by mouth 2 (two) times daily with a meal.     montelukast (SINGULAIR) 10 MG tablet Take 10 mg by mouth at bedtime.     oxyCODONE (OXY IR/ROXICODONE) 5 MG immediate release tablet Take 1 tablet (5 mg total) by mouth every 4 (four) hours as needed for moderate pain ((score 4 to 6)). (Patient not taking: Reported on 01/09/2022) 40 tablet 0   No current facility-administered medications for this visit.    Review of Systems General: negative for, fevers, chills, fatigue, changes in sleep, changes in weight or appetite Skin: negative for changes in color, texture, moles or lesions Eyes: negative for, changes in vision, pain, diplopia HEENT: negative for, change in hearing, pain, discharge, tinnitus, vertigo, voice changes, sore throat, neck masses Breasts: negative for breast lumps Pulmonary: negative for, dyspnea, orthopnea, productive cough Cardiac: negative for, palpitations, syncope, pain, discomfort, pressure Gastrointestinal: negative for, dysphagia, nausea, vomiting, jaundice, pain, constipation, diarrhea, hematemesis, hematochezia Genitourinary/Sexual: negative for, dysuria, discharge, hesitancy, nocturia, retention, stones, infections, STD's, incontinence Musculoskeletal: negative for, pain, stiffness, swelling, range of motion limitation Hematology: No coagulation disorder, negative for, easy bruising,  bleeding Neurologic/Psych: negative for, headaches, seizures, paralysis, weakness, tremor, change in gait, change in sensation, mood swings, depression, anxiety, change in memory  Objective:  Physical Examination:  There were no vitals taken for this visit.    ECOG Performance Status: 1 - Symptomatic but completely ambulatory  General appearance: alert, cooperative, and appears stated age 58: sclera clear Neck: no thyroid enlargement or cervical adenopathy Lymph node survey: non-palpable, axillary, inguinal, supraclavicular Cardiovascular: without murmurs, rubs or gallops Respiratory: clear to auscultation Abdomen: without hepatosplenomegaly Back: inspection of back is normal Extremities: no lower extremity edema Skin exam - normal coloration and turgor, no rashes, no suspicious skin lesions noted. Neurological exam reveals alert, oriented, normal speech, no focal findings or movement disorder noted.  Pelvic: EGBUS: no lesions Vagina: narrow with tumor in upper vagina Uterus: normal size, nontender, mobile Adnexa: no palpable masses Rectovaginal: confirmatory  Lab Review Labs on site today:   Chemistry  Component Value Date/Time   NA 136 03/27/2017 0433   K 4.3 03/27/2017 0433   CL 99 (L) 03/27/2017 0433   CO2 27 03/27/2017 0433   BUN 26 (H) 03/27/2017 0433   CREATININE 1.04 (H) 03/27/2017 0433      Component Value Date/Time   CALCIUM 8.6 (L) 03/27/2017 0433      Lab Results  Component Value Date   WBC 11.0 03/27/2017   HGB 12.5 03/27/2017   HCT 38.2 03/27/2017   MCV 80.3 03/27/2017   PLT 222 03/27/2017      Assessment:  Terri [name] is a 71 y.o. 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 Poorly differentiated cancer with abnormal TP53 overexpression and negative HPV test so less likely to be cervical primary.   Medical co-morbidities complicating care: obesity, COPD, aortic stenosis, PVD.  Plan:    Problem List Items Addressed This Visit       Genitourinary   Endometrial cancer [facility])   Other Visit Diagnoses     Gynecologic malignancy ([place])    -  Primary   Relevant Orders   NM PET Image Initial (PI) Skull Base To Thigh      We discussed options for management including PET/CT to determine extent of disease.  If localized to the pelvis could start treatment with radiation.  However, if there is evidence of  more distant spread then most likely would recommend chemotherapy with carboplatin/taxol.  Will check HER2 to see if addition of Herceptin would be an option.   The patient's diagnosis, an outline of the further diagnostic and laboratory studies which will be required, the recommendation for surgery, and alternatives were discussed with her and her accompanying family members.  All questions were answered to their satisfaction.  Verlon Au, NP  I personally interviewed and examined the patient. Agreed with the above/below plan of care. I have directly contributed to assessment and plan of care of this patient and educated and discussed with patient and family.  Mellody Drown, MD    CC:  Doy Hutching Leonie Douglas, MD [facility] [place],  [place] [address] [phone]

## 2022-01-25 DIAGNOSIS — C541 Malignant neoplasm of endometrium: Secondary | ICD-10-CM | POA: Insufficient documentation

## 2022-01-30 DIAGNOSIS — M1712 Unilateral primary osteoarthritis, left knee: Secondary | ICD-10-CM | POA: Diagnosis not present

## 2022-01-30 DIAGNOSIS — N950 Postmenopausal bleeding: Secondary | ICD-10-CM | POA: Diagnosis not present

## 2022-01-30 DIAGNOSIS — C541 Malignant neoplasm of endometrium: Secondary | ICD-10-CM | POA: Diagnosis not present

## 2022-02-13 ENCOUNTER — Ambulatory Visit
Admission: RE | Admit: 2022-02-13 | Discharge: 2022-02-13 | Disposition: A | Discharge status: Home / Self Care | Payer: PPO | Source: Ambulatory Visit | Attending: Nurse Practitioner | Admitting: Nurse Practitioner

## 2022-02-13 DIAGNOSIS — C579 Malignant neoplasm of female genital organ, unspecified: Secondary | ICD-10-CM | POA: Diagnosis present

## 2022-02-13 DIAGNOSIS — C55 Malignant neoplasm of uterus, part unspecified: Secondary | ICD-10-CM | POA: Diagnosis not present

## 2022-02-13 DIAGNOSIS — C541 Malignant neoplasm of endometrium: Secondary | ICD-10-CM | POA: Diagnosis not present

## 2022-02-13 LAB — GLUCOSE, CAPILLARY: Glucose-Capillary: 132 mg/dL — ABNORMAL HIGH (ref 70–99)

## 2022-02-13 MED ORDER — FLUDEOXYGLUCOSE F - 18 (FDG) INJECTION
10.7000 | Freq: Once | INTRAVENOUS | Status: AC | PRN
  Administered 2022-02-13: 11.73 via INTRAVENOUS

## 2022-02-14 ENCOUNTER — Inpatient Hospital Stay: Payer: PPO | Attending: Obstetrics and Gynecology | Admitting: Obstetrics and Gynecology

## 2022-02-14 VITALS — BP 158/62 | HR 54 | Temp 96.6°F | Resp 20 | Wt 205.0 lb

## 2022-02-14 DIAGNOSIS — I350 Nonrheumatic aortic (valve) stenosis: Secondary | ICD-10-CM | POA: Diagnosis not present

## 2022-02-14 DIAGNOSIS — R32 Unspecified urinary incontinence: Secondary | ICD-10-CM | POA: Diagnosis not present

## 2022-02-14 DIAGNOSIS — E1151 Type 2 diabetes mellitus with diabetic peripheral angiopathy without gangrene: Secondary | ICD-10-CM | POA: Insufficient documentation

## 2022-02-14 DIAGNOSIS — Z6837 Body mass index (BMI) 37.0-37.9, adult: Secondary | ICD-10-CM | POA: Diagnosis not present

## 2022-02-14 DIAGNOSIS — C541 Malignant neoplasm of endometrium: Secondary | ICD-10-CM | POA: Diagnosis not present

## 2022-02-14 DIAGNOSIS — J4489 Other specified chronic obstructive pulmonary disease: Secondary | ICD-10-CM | POA: Diagnosis not present

## 2022-02-14 NOTE — Progress Notes (Signed)
Gynecologic Oncology Consult Visit   Referring Provider: Dr. Glennon Mac  Chief Complaint: Endometrial Cancer  Subjective:  Terri [name] is a 71 y.o. female who is seen in consultation from Dr. Glennon Mac for new diagnosis of endometrial cancer.    Patient seen 9/27 and found to have extensive cervical and upper vaginal involvement.  Only having light bleeding.  PET/CT scan 02/13/22 showed no evidence of distant disease.  Borderline enlarged common iliac nodes, but FDG negative.   PET/CT EXAM: NUCLEAR MEDICINE PET SKULL BASE TO THIGH   TECHNIQUE: 11.7 mCi F-18 FDG was injected intravenously. Full-ring PET imaging was performed from the skull base to thigh after the radiotracer. CT data was obtained and used for attenuation correction and anatomic localization.   Fasting blood glucose: 132 mg/dl   COMPARISON:  None Available.   FINDINGS: Mediastinal blood-pool activity (background): SUV max = 1.7   Liver activity (reference): SUV max = N/A   NECK:  No hypermetabolic lymph nodes or masses.   Incidental CT findings:  None.   CHEST: No hypermetabolic lymph nodes. No suspicious pulmonary nodules seen on CT images.   Incidental CT findings: Scarring in posterior right upper lobe. Aortic and coronary atherosclerotic calcification incidentally noted.   ABDOMEN/PELVIS: No abnormal hypermetabolic activity within the liver, pancreas, adrenal glands, or spleen.   Uterus is mildly enlarged. Marked hypermetabolism is seen diffusely throughout the entire uterus, including the fundus, body, and cervix, with SUV max 21.8. Activity is noted in the distal left ureter. A 10 mm left common iliac lymph node and 8 mm right common iliac lymph node are noted, but show absence of FDG uptake.   Incidental CT findings: Small hiatal hernia. Aortic atherosclerotic calcification incidentally noted.   SKELETON: No focal hypermetabolic bone lesions to suggest skeletal metastasis.   Incidental CT  findings:  None.   IMPRESSION: Marked diffuse hypermetabolic activity throughout the entire uterus and cervix, consistent with malignancy. This favors endometrial carcinoma over cervical carcinoma.   Borderline enlarged bilateral common iliac lymph nodes noted, but without FDG uptake. No definite evidence of metastatic disease by PET.   Gyn Oncology history She was referred by Dr. Doy Hutching to Dr. Glennon Mac for reports of postmenopausal bleeding for the past 6-7 months, may be more.  Scant.  Became heavier. Additionally has urinary incontinence.  Reports weight gain.   On exam, friable cervical vs vaginal mass was partially visualized though exam was limited. Biopsy was performed.   Cervical Biopsy: poorly differentiated carcinoma, favor endometrial origin. Positive for p53 and ER. Focal staining for p16, negative for napsin and p63.    Colonoscopy was scheduled but prep inadequate.   Aunt had esophageal cancer.    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   Hyperlipemia, mixed 04/05/2015   HTN, goal below 140/80 04/05/2015   Obesity (BMI 30-39.9) 04/05/2015   Type 2 diabetes mellitus with complication, with long-term current use of insulin ([place]) 04/05/2015   Class 2 severe obesity due to excess calories with serious comorbidity in adult ([place]) 08/19/2013   Obesity, Class II, BMI 35-39.9, with comorbidity 08/19/2013   Chronic obstructive pulmonary disease, unspecified ([place]) 07/31/2012   Claudication ([place]) 03/12/2012   Tobacco use 03/12/2012   Hyperlipidemia associated  with type 2 diabetes mellitus ([place]) 06/06/2011   Hypertension associated with diabetes ([place]) 06/06/2011   Major depression  06/06/2011   Type 2 diabetes mellitus without complication ([place]) 16/01/9603    Past Medical History: Past Medical History:  Diagnosis Date   Adenomatous colon polyp    Aortic stenosis    Arthritis    Asthma    Claudication (HCC)    COPD (chronic obstructive pulmonary disease) ([place])    Depression    Diabetes mellitus without complication (HCC)    Esophageal dysphagia    GERD (gastroesophageal reflux disease)    Hyperlipemia    Hypertension    Obesity    Severe obesity (BMI 35.0-35.9 with comorbidity) ([place])     Past Surgical History: Past Surgical History:  Procedure Laterality Date   BLADDER SURGERY     COLONOSCOPY WITH PROPOFOL N/A 08/05/2015   Procedure: COLONOSCOPY WITH PROPOFOL;  Surgeon: Josefine Class, MD;  Location[facility] END[facility]COPY;  Service: Endoscopy;  Laterality: N/A;   ESOPHAGOGASTRODUODENOSCOPY N/A 01/09/2022   Procedure: ESOPHAGOGASTRODUODENOSCOPY (EGD);  Surgeon: [name], MD;  Location: [facility] ENDOSCOPY;  Service: Endoscopy;  Laterality: N/A;   FOOT SURGERY Left    FRACTURE SURGERY     ORIF ANKLE FRACTURE Left 03/26/2017   Procedure: OPEN REDUCTION INTERNAL FIXATION (ORIF) ANKLE FRACTURE;  Surgeon: Corky Mull, MD;  Location: [facility] ORS;  Service: Orthopedics;  Laterality: Left;    OB History:  G17P0 OB History  No obstetric history on file.    Family History: Family History  Problem Relation Age of Onset   CVA Mother    Diabetes Mother    CAD Father    Breast cancer Neg Hx     Social History: Social History   Socioeconomic History   Marital status: Single    Spouse name: Not on file   Number of children: Not on file   Years of education: Not on file   Highest education level: Not on file  Occupational History   Not on file  Tobacco Use   Smoking status: Former    Packs/day: 1.00    Years: 40.00    Total pack years: 40.00    Types: Cigarettes   Smokeless tobacco: Never  Vaping Use   Vaping Use: Never used  Substance and  Sexual Activity   Alcohol use: No   Drug use: No   Sexual activity: Not on file  Other Topics Concern   Not on file  Social History Narrative   Not on file   Social Determinants of Health   Financial Resource Strain: Not on file  Food Insecurity: Not on file  Transportation Needs: Not on file  Physical Activity: Not on file  Stress: Not on file  Social Connections: Not on file  Intimate Partner Violence: Not on file    Allergies: No Known Allergies  Current Medications: Current Outpatient Medications  Medication Sig Dispense Refill   albuterol (PROVENTIL HFA;VENTOLIN HFA) 108 (90 Base) MCG/ACT inhaler Inhale 2 puffs into the lungs every 6 (six) hours as needed for wheezing or shortness of breath.     amLODipine (NORVASC) 10 MG tablet Take 10 mg by mouth daily.     aspirin EC 81 MG tablet Take 81 mg by mouth daily.     atenolol (TENORMIN) 25 MG tablet Take 25 mg by mouth daily.     atorvastatin (LIPITOR) 80 MG tablet Take 80 mg by mouth daily.     buPROPion (WELLBUTRIN XL) 300 MG 24 hr  tablet Take 300 mg by mouth daily.     cholecalciferol (VITAMIN D) 400 units TABS tablet Take 400 Units by mouth daily.     insulin aspart (NOVOLOG) 100 UNIT/ML injection Inject 13 Units into the skin 3 (three) times daily before meals.     insulin detemir (LEVEMIR FLEXPEN) 100 UNIT/ML FlexPen Inject 25 units in the morning. Inject 23 units in the evening.     losartan-hydrochlorothiazide (HYZAAR) 100-25 MG tablet Take 1 tablet by mouth daily.     metFORMIN (GLUCOPHAGE) 500 MG tablet Take 1,000 mg by mouth 2 (two) times daily with a meal.     montelukast (SINGULAIR) 10 MG tablet Take 10 mg by mouth at bedtime.     enoxaparin (LOVENOX) 40 MG/0.4ML injection Inject 0.4 mLs (40 mg total) into the skin daily. (Patient not taking: Reported on 01/09/2022) 14 Syringe 0   insulin glargine (LANTUS) 100 UNIT/ML injection Inject 25 Units into the skin 2 (two) times daily. (Patient not taking: Reported on  02/14/2022)     oxyCODONE (OXY IR/ROXICODONE) 5 MG immediate release tablet Take 1 tablet (5 mg total) by mouth every 4 (four) hours as needed for moderate pain ((score 4 to 6)). (Patient not taking: Reported on 01/09/2022) 40 tablet 0   No current facility-administered medications for this visit.    Review of Systems General: negative for, fevers, chills, fatigue, changes in sleep, changes in weight or appetite Skin: negative for changes in color, texture, moles or lesions Eyes: negative for, changes in vision, pain, diplopia HEENT: negative for, change in hearing, pain, discharge, tinnitus, vertigo, voice changes, sore throat, neck masses Breasts: negative for breast lumps Pulmonary: negative for, dyspnea, orthopnea, productive cough Cardiac: negative for, palpitations, syncope, pain, discomfort, pressure Gastrointestinal: negative for, dysphagia, nausea, vomiting, jaundice, pain, constipation, diarrhea, hematemesis, hematochezia Genitourinary/Sexual: negative for, dysuria, discharge, hesitancy, nocturia, retention, stones, infections, STD's, incontinence Musculoskeletal: negative for, pain, stiffness, swelling, range of motion limitation Hematology: No coagulation disorder, negative for, easy bruising, bleeding Neurologic/Psych: negative for, headaches, seizures, paralysis, weakness, tremor, change in gait, change in sensation, mood swings, depression, anxiety, change in memory  Objective:  Physical Examination:  BP (!) 158/62   Pulse (!) 54   Temp (!) 96.6 F (35.9 C)   Resp 20   Wt 205 lb (93 kg)   SpO2 98%   BMI 37.49 kg/m     ECOG Performance Status: 1 - Symptomatic but completely ambulatory  General appearance: alert, cooperative, and appears stated age HEENT: sclera clear Neck: no thyroid enlargement or cervical adenopathy Lymph node survey: non-palpable, axillary, inguinal, supraclavicular Cardiovascular: without murmurs, rubs or gallops Respiratory: clear to  auscultation Abdomen: without hepatosplenomegaly Back: inspection of back is normal Extremities: no lower extremity edema Skin exam - normal coloration and turgor, no rashes, no suspicious skin lesions noted. Neurological exam reveals alert, oriented, normal speech, no focal findings or movement disorder noted.  Pelvic per 01/24/22: EGBUS: no lesions Vagina: narrow with tumor in upper vagina Uterus: normal size, nontender, mobile Adnexa: no palpable masses Rectovaginal: confirmatory  Lab Review Labs on site today:   Chemistry      Component Value Date/Time   NA 136 03/27/2017 0433   K 4.3 03/27/2017 0433   CL 99 (L) 03/27/2017 0433   CO2 27 03/27/2017 0433   BUN 26 (H) 03/27/2017 0433   CREATININE 1.04 (H) 03/27/2017 0433      Component Value Date/Time   CALCIUM 8.6 (L) 03/27/2017 0433      Lab  Results  Component Value Date   WBC 11.0 03/27/2017   HGB 12.5 03/27/2017   HCT 38.2 03/27/2017   MCV 80.3 03/27/2017   PLT 222 03/27/2017      Assessment:  Terri [name] is a 71 y.o. 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  PET scan shows involvement of entire uterus and cervix.  No distant disease. Bilateral common iliac nodes are borderline enlarged, but not PET positive.   Poorly differentiated cancer with abnormal TP53 overexpression and negative HPV test so unlikely to be cervical primary.   Medical co-morbidities complicating care: obesity, COPD, aortic stenosis, PVD.  Plan:   Problem List Items Addressed This Visit       Genitourinary   Endometrial cancer ([place]) - Primary    We discussed options for management including primary external radiation in view of locally advanced disease.  Then will re-examine and consider options for surgery versus brachytherapy and/or chemotherapy with carboplatin/taxol.  Will check HER2 to see if addition of Herceptin would be an option and MSI/MMR IHC to see if a candidate  for pembrolizumab.  I spoke to Dr Baruch Gouty in [place] today and he will see her later this week to plan for initiation of pelvic radiation.  Patient was not eager to have chemotherapy so is pleased to begin treatment with radiation.   We will see her back with CT scan after she completes external radiation to assess response and next steps.    The patient's diagnosis, an outline of the further diagnostic and laboratory studies which will be required, the recommendation for surgery, and alternatives were discussed with her and her accompanying family members.  All questions were answered to their satisfaction.  Mellody Drown, MD    CC:  Doy Hutching Leonie Douglas, MD [facility] [place] [facility] [place],  [place] [phone]

## 2022-02-15 ENCOUNTER — Telehealth: Payer: Self-pay

## 2022-02-15 NOTE — Telephone Encounter (Signed)
Radiation appointment sent securely to her email per her request. Requested response back that she received appointment.

## 2022-02-19 ENCOUNTER — Other Ambulatory Visit: Payer: Self-pay

## 2022-02-21 ENCOUNTER — Encounter: Payer: Self-pay | Admitting: Radiation Oncology

## 2022-02-21 ENCOUNTER — Ambulatory Visit
Admission: RE | Admit: 2022-02-21 | Discharge: 2022-02-21 | Disposition: A | Discharge status: Home / Self Care | Payer: PPO | Source: Ambulatory Visit | Attending: Radiation Oncology | Admitting: Radiation Oncology

## 2022-02-21 ENCOUNTER — Inpatient Hospital Stay: Payer: PPO

## 2022-02-21 VITALS — BP 188/74 | Temp 97.0°F | Resp 16 | Ht 62.0 in | Wt 206.0 lb

## 2022-02-21 DIAGNOSIS — C541 Malignant neoplasm of endometrium: Secondary | ICD-10-CM | POA: Diagnosis not present

## 2022-02-21 NOTE — Consult Note (Signed)
NEW PATIENT EVALUATION  Name: Terri [name]  MRN: [id number]  Date:   02/21/2022     DOB: Sep 28, 1950   This 71 y.o. female patient presents to the clinic for initial evaluation of local advanced stage IIIb endometrial carcinoma with cervical and vaginal involvement.  REFERRING PHYSICIAN: Idelle Crouch, MD  CHIEF COMPLAINT:  Chief Complaint  Patient presents with   endometrial cancer    DIAGNOSIS: There were no encounter diagnoses.   PREVIOUS INVESTIGATIONS:  PET CT scan reviewed Clinical notes reviewed Pathology reports reviewed  HPI: Patient is a 71 year old female who presented with postmenopausal bleeding.  She was found at the time of of examination to have extensive cervical and upper vaginal involvement with some light bleeding.  Patient also had also had developed urinary incontinence.  PET/CT CT scan demonstrated marked diffuse hypermetabolic 70 throughout the entire uterus and cervix consistent with malignancy favoring endometrial cancer over cervical cancer.  She had borderline enlarged bilateral common iliac nodes without FDG uptake.  No evidence of metastatic disease by PET.  Cervical biopsy was positive for poorly differentiated carcinoma favoring endometrial carcinoma it was p53 positive and ER positive.  Patient has multiple medical comorbidities including obesity COPD aortic stenosis and PVD.  I have discussed the [place] case with Dr. Fransisca Connors who is recommended initial radiation therapy to her pelvis and reevaluation for possible surgery versus brachytherapy after completion of radiation.  She is seen today she is having no abdominal pain continues to have some light bleeding vaginally.  PLANNED TREATMENT REGIMEN: External beam IMRT treatment to her pelvis  PAST MEDICAL HISTORY:  has a past medical history of Adenomatous colon polyp, Aortic stenosis, Arthritis, Asthma, Claudication ([place]), COPD (chronic obstructive pulmonary disease) ([place]), Depression, Diabetes mellitus  without complication ([place]), Esophageal dysphagia, GERD (gastroesophageal reflux disease), Hyperlipemia, Hypertension, Obesity, and Severe obesity (BMI 35.0-35.9 with comorbidity) ([place]).    PAST SURGICAL HISTORY:  Past Surgical History:  Procedure Laterality Date   BLADDER SURGERY     COLONOSCOPY WITH PROPOFOL N/A 08/05/2015   Procedure: COLONOSCOPY WITH PROPOFOL;  Surgeon: Josefine Class, MD;  Location[facility] ENDOSCOPY;  Service: Endoscopy;  Laterality: N/A;   ESOPHAGOGASTRODUODENOSCOPY N/A 01/09/2022   Procedure: ESOPHAGOGASTRODUODENOSCOPY (EGD);  Surgeon: [name], MD;  Location: [facility] ENDOSCOPY;  Service: Endoscopy;  Laterality: N/A;   FOOT SURGERY Left    FRACTURE SURGERY     ORIF ANKLE FRACTURE Left 03/26/2017   Procedure: OPEN REDUCTION INTERNAL FIXATION (ORIF) ANKLE FRACTURE;  Surgeon: Corky Mull, MD;  Location: [facility] ORS;  Service: Orthopedics;  Laterality: Left;    FAMILY HISTORY: family history includes CAD in her father; CVA in her mother; Diabetes in her mother.  SOCIAL HISTORY:  reports that she has quit smoking. Her smoking use included cigarettes. She has a 40.00 pack-year smoking history. She has never used smokeless tobacco. She reports that she does not drink alcohol and does not use drugs.  ALLERGIES: Patient has no known allergies.  MEDICATIONS:  Current Outpatient Medications  Medication Sig Dispense Refill   albuterol (PROVENTIL HFA;VENTOLIN HFA) 108 (90 Base) MCG/ACT inhaler Inhale 2 puffs into the lungs every 6 (six) hours as needed for wheezing or shortness of breath.     amLODipine (NORVASC) 10 MG tablet Take 10 mg by mouth daily.     aspirin EC 81 MG tablet Take 81 mg by mouth daily.     atenolol (TENORMIN) 25 MG tablet Take 25 mg by mouth daily.     atorvastatin (LIPITOR) 80  MG tablet Take 80 mg by mouth daily.     buPROPion (WELLBUTRIN XL) 300 MG 24 hr tablet Take 300 mg by mouth daily.     cholecalciferol (VITAMIN D) 400 units TABS tablet  Take 400 Units by mouth daily.     insulin aspart (NOVOLOG) 100 UNIT/ML injection Inject 13 Units into the skin 3 (three) times daily before meals.     insulin detemir (LEVEMIR FLEXPEN) 100 UNIT/ML FlexPen Inject 25 units in the morning. Inject 23 units in the evening.     losartan-hydrochlorothiazide (HYZAAR) 100-25 MG tablet Take 1 tablet by mouth daily.     metFORMIN (GLUCOPHAGE) 500 MG tablet Take 1,000 mg by mouth 2 (two) times daily with a meal.     montelukast (SINGULAIR) 10 MG tablet Take 10 mg by mouth at bedtime.     enoxaparin (LOVENOX) 40 MG/0.4ML injection Inject 0.4 mLs (40 mg total) into the skin daily. (Patient not taking: Reported on 01/09/2022) 14 Syringe 0   insulin glargine (LANTUS) 100 UNIT/ML injection Inject 25 Units into the skin 2 (two) times daily. (Patient not taking: Reported on 02/21/2022)     oxyCODONE (OXY IR/ROXICODONE) 5 MG immediate release tablet Take 1 tablet (5 mg total) by mouth every 4 (four) hours as needed for moderate pain ((score 4 to 6)). (Patient not taking: Reported on 01/09/2022) 40 tablet 0   No current facility-administered medications for this encounter.    ECOG PERFORMANCE STATUS:  1 - Symptomatic but completely ambulatory  REVIEW OF SYSTEMS: Patient denies any weight loss, fatigue, weakness, fever, chills or night sweats. Patient denies any loss of vision, blurred vision. Patient denies any ringing  of the ears or hearing loss. No irregular heartbeat. Patient denies heart murmur or history of fainting. Patient denies any chest pain or pain radiating to her upper extremities. Patient denies any shortness of breath, difficulty breathing at night, cough or hemoptysis. Patient denies any swelling in the lower legs. Patient denies any nausea vomiting, vomiting of blood, or coffee ground material in the vomitus. Patient denies any stomach pain. Patient states has had normal bowel movements no significant constipation or diarrhea. Patient denies any dysuria,  hematuria or significant nocturia. Patient denies any problems walking, swelling in the joints or loss of balance. Patient denies any skin changes, loss of hair or loss of weight. Patient denies any excessive worrying or anxiety or significant depression. Patient denies any problems with insomnia. Patient denies excessive thirst, polyuria, polydipsia. Patient denies any swollen glands, patient denies easy bruising or easy bleeding. Patient denies any recent infections, allergies or URI. Patient "s visual fields have not changed significantly in recent time.   PHYSICAL EXAM: BP (!) 188/74 (BP Location: Left Arm, Patient Position: Sitting, Cuff Size: Normal)   Temp (!) 97 F (36.1 C) (Tympanic)   Resp 16   Ht 5' 2"  (1.575 m)   Wt 206 lb (93.4 kg)   BMI 37.68 kg/m  Slightly obese female in NAD.  Well-developed well-nourished patient in NAD. HEENT reveals PERLA, EOMI, discs not visualized.  Oral cavity is clear. No oral mucosal lesions are identified. Neck is clear without evidence of cervical or supraclavicular adenopathy. Lungs are clear to A&P. Cardiac examination is essentially unremarkable with regular rate and rhythm without murmur rub or thrill. Abdomen is benign with no organomegaly or masses noted. Motor sensory and DTR levels are equal and symmetric in the upper and lower extremities. Cranial nerves II through XII are grossly intact. Proprioception is intact. No  peripheral adenopathy or edema is identified. No motor or sensory levels are noted. Crude visual fields are within normal range.  LABORATORY DATA: Pathology reports reviewed    RADIOLOGY RESULTS: PET CT scan reviewed compatible with above-stated findings   IMPRESSION: Stage IIIb locally vasoinhibitor carcinoma in 71 year old female  PLAN: This time active ahead with radiation therapy to her pelvis.  Would use IMRT treatment planning and delivery.  Would treat her hypermetabolic uterus as well as cervical involvement up to 50 Gray  over 5 weeks treating her pelvic lymph nodes to 45 Gray using IMRT dose painting technique.  Risks and benefits of treatment including skin reaction fatigue alteration blood counts possible increased lower Neri tract symptoms diarrhea all were discussed in detail.  After completion of radiation will be reimaged and make further decisions with GYN oncology on further avenues of treatment.  Patient comprehends my recommendations well.  I personally set up and ordered CT simulation for early next week.  I would like to take this opportunity to thank you for allowing me to participate in the care of your patient.Noreene Filbert, MD

## 2022-02-26 ENCOUNTER — Encounter: Payer: Self-pay | Admitting: Obstetrics and Gynecology

## 2022-02-26 DIAGNOSIS — C541 Malignant neoplasm of endometrium: Secondary | ICD-10-CM | POA: Diagnosis not present

## 2022-02-26 NOTE — Progress Notes (Signed)
Pathology slides received from [facility] to pathology along with order for Her-2, MMR, and MSI testing.

## 2022-02-27 DIAGNOSIS — M25562 Pain in left knee: Secondary | ICD-10-CM | POA: Diagnosis not present

## 2022-02-28 ENCOUNTER — Inpatient Hospital Stay: Payer: PPO

## 2022-02-28 ENCOUNTER — Ambulatory Visit
Admission: RE | Admit: 2022-02-28 | Discharge: 2022-02-28 | Disposition: A | Discharge status: Home / Self Care | Payer: PPO | Source: Ambulatory Visit | Attending: Radiation Oncology | Admitting: Radiation Oncology

## 2022-02-28 DIAGNOSIS — C541 Malignant neoplasm of endometrium: Secondary | ICD-10-CM | POA: Insufficient documentation

## 2022-02-28 DIAGNOSIS — Z510 Encounter for antineoplastic radiation therapy: Secondary | ICD-10-CM | POA: Insufficient documentation

## 2022-03-07 ENCOUNTER — Other Ambulatory Visit: Payer: PPO

## 2022-03-08 DIAGNOSIS — Z510 Encounter for antineoplastic radiation therapy: Secondary | ICD-10-CM | POA: Diagnosis not present

## 2022-03-08 DIAGNOSIS — C541 Malignant neoplasm of endometrium: Secondary | ICD-10-CM | POA: Diagnosis not present

## 2022-03-08 NOTE — Progress Notes (Signed)
Tumor Board Documentation  Oriah Leinweber was presented by Mariea Clonts RN at our Tumor Board on 03/07/2022, which included representatives from medical oncology, radiation oncology, surgical oncology, pathology, navigation, genetics, palliative care.  Gerri currently presents as a current patient, for discussion, for new positive pathology with history of the following treatments: none.  Additionally, we reviewed previous medical and familial history, history of present illness, and recent lab results along with all available histopathologic and imaging studies. The tumor board considered available treatment options and made the following recommendations: Radiation therapy (primary modality) Primary external radiaiton in view of locally advanced disease. Then re-examine and consider options for surgery versus brachtherapy and/or chemotherapy with carboplatin/taxol  The following procedures/referrals were also placed: No orders of the defined types were placed in this encounter.   Clinical Trial Status: not discussed   Staging used: Clinical Stage AJCC Staging:       Group: IIIB   National site-specific guidelines   were discussed with respect to the case.  Tumor board is a meeting of clinicians from various specialty areas who evaluate and discuss patients for whom a multidisciplinary approach is being considered. Final determinations in the plan of care are those of the provider(s). The responsibility for follow up of recommendations given during tumor board is that of the provider.   Today's extended care, comprehensive team conference, Gertha was not present for the discussion and was not examined.   Multidisciplinary Tumor Board is a multidisciplinary case peer review process.  Decisions discussed in the Multidisciplinary Tumor Board reflect the opinions of the specialists present at the conference without having examined the patient.  Ultimately, treatment and diagnostic decisions rest  with the primary provider(s) and the patient.

## 2022-03-09 ENCOUNTER — Other Ambulatory Visit: Payer: Self-pay | Admitting: *Deleted

## 2022-03-09 DIAGNOSIS — C541 Malignant neoplasm of endometrium: Secondary | ICD-10-CM

## 2022-03-12 ENCOUNTER — Inpatient Hospital Stay: Payer: PPO

## 2022-03-12 ENCOUNTER — Encounter: Payer: Self-pay | Admitting: *Deleted

## 2022-03-12 ENCOUNTER — Ambulatory Visit
Admission: RE | Admit: 2022-03-12 | Discharge: 2022-03-12 | Disposition: A | Discharge status: Home / Self Care | Payer: PPO | Source: Ambulatory Visit | Attending: Radiation Oncology | Admitting: Radiation Oncology

## 2022-03-13 ENCOUNTER — Ambulatory Visit
Admission: RE | Admit: 2022-03-13 | Discharge: 2022-03-13 | Disposition: A | Discharge status: Home / Self Care | Payer: PPO | Source: Ambulatory Visit | Attending: Radiation Oncology | Admitting: Radiation Oncology

## 2022-03-13 ENCOUNTER — Other Ambulatory Visit: Payer: Self-pay

## 2022-03-13 ENCOUNTER — Inpatient Hospital Stay: Payer: PPO

## 2022-03-13 DIAGNOSIS — C541 Malignant neoplasm of endometrium: Secondary | ICD-10-CM | POA: Diagnosis not present

## 2022-03-13 DIAGNOSIS — Z510 Encounter for antineoplastic radiation therapy: Secondary | ICD-10-CM | POA: Diagnosis not present

## 2022-03-13 LAB — RAD ONC ARIA SESSION SUMMARY
Course Elapsed Days: 0
Plan Fractions Treated to Date: 1
Plan Prescribed Dose Per Fraction: 2 Gy
Plan Total Fractions Prescribed: 25
Plan Total Prescribed Dose: 50 Gy
Reference Point Dosage Given to Date: 2 Gy
Reference Point Session Dosage Given: 2 Gy
Session Number: 1

## 2022-03-14 ENCOUNTER — Ambulatory Visit
Admission: RE | Admit: 2022-03-14 | Discharge: 2022-03-14 | Disposition: A | Discharge status: Home / Self Care | Payer: PPO | Source: Ambulatory Visit | Attending: Radiation Oncology | Admitting: Radiation Oncology

## 2022-03-14 ENCOUNTER — Inpatient Hospital Stay: Payer: PPO

## 2022-03-14 ENCOUNTER — Other Ambulatory Visit: Payer: Self-pay

## 2022-03-14 ENCOUNTER — Encounter: Payer: Self-pay | Admitting: *Deleted

## 2022-03-14 DIAGNOSIS — C541 Malignant neoplasm of endometrium: Secondary | ICD-10-CM | POA: Diagnosis not present

## 2022-03-14 DIAGNOSIS — Z510 Encounter for antineoplastic radiation therapy: Secondary | ICD-10-CM | POA: Diagnosis not present

## 2022-03-14 LAB — RAD ONC ARIA SESSION SUMMARY
Course Elapsed Days: 1
Plan Fractions Treated to Date: 2
Plan Prescribed Dose Per Fraction: 2 Gy
Plan Total Fractions Prescribed: 25
Plan Total Prescribed Dose: 50 Gy
Reference Point Dosage Given to Date: 4 Gy
Reference Point Session Dosage Given: 2 Gy
Session Number: 2

## 2022-03-15 ENCOUNTER — Ambulatory Visit
Admission: RE | Admit: 2022-03-15 | Discharge: 2022-03-15 | Disposition: A | Discharge status: Home / Self Care | Payer: PPO | Source: Ambulatory Visit | Attending: Radiation Oncology | Admitting: Radiation Oncology

## 2022-03-15 ENCOUNTER — Inpatient Hospital Stay: Payer: PPO

## 2022-03-15 ENCOUNTER — Other Ambulatory Visit: Payer: Self-pay

## 2022-03-15 ENCOUNTER — Encounter: Payer: Self-pay | Admitting: *Deleted

## 2022-03-15 DIAGNOSIS — C541 Malignant neoplasm of endometrium: Secondary | ICD-10-CM | POA: Diagnosis not present

## 2022-03-15 DIAGNOSIS — Z510 Encounter for antineoplastic radiation therapy: Secondary | ICD-10-CM | POA: Diagnosis not present

## 2022-03-15 LAB — CBC
HCT: 40 % (ref 36.0–46.0)
Hemoglobin: 12.7 g/dL (ref 12.0–15.0)
MCH: 26.7 pg (ref 26.0–34.0)
MCHC: 31.8 g/dL (ref 30.0–36.0)
MCV: 84.2 fL (ref 80.0–100.0)
Platelets: 231 10*3/uL (ref 150–400)
RBC: 4.75 MIL/uL (ref 3.87–5.11)
RDW: 13.9 % (ref 11.5–15.5)
WBC: 8.5 10*3/uL (ref 4.0–10.5)
nRBC: 0 % (ref 0.0–0.2)

## 2022-03-15 LAB — RAD ONC ARIA SESSION SUMMARY
Course Elapsed Days: 2
Plan Fractions Treated to Date: 3
Plan Prescribed Dose Per Fraction: 2 Gy
Plan Total Fractions Prescribed: 25
Plan Total Prescribed Dose: 50 Gy
Reference Point Dosage Given to Date: 6 Gy
Reference Point Session Dosage Given: 2 Gy
Session Number: 3

## 2022-03-16 ENCOUNTER — Inpatient Hospital Stay: Payer: PPO

## 2022-03-16 ENCOUNTER — Ambulatory Visit
Admission: RE | Admit: 2022-03-16 | Discharge: 2022-03-16 | Disposition: A | Discharge status: Home / Self Care | Payer: PPO | Source: Ambulatory Visit | Attending: Radiation Oncology | Admitting: Radiation Oncology

## 2022-03-16 ENCOUNTER — Encounter: Payer: Self-pay | Admitting: *Deleted

## 2022-03-16 ENCOUNTER — Other Ambulatory Visit: Payer: Self-pay

## 2022-03-16 DIAGNOSIS — C541 Malignant neoplasm of endometrium: Secondary | ICD-10-CM | POA: Diagnosis not present

## 2022-03-16 DIAGNOSIS — Z510 Encounter for antineoplastic radiation therapy: Secondary | ICD-10-CM | POA: Diagnosis not present

## 2022-03-16 LAB — RAD ONC ARIA SESSION SUMMARY
Course Elapsed Days: 3
Plan Fractions Treated to Date: 4
Plan Prescribed Dose Per Fraction: 2 Gy
Plan Total Fractions Prescribed: 25
Plan Total Prescribed Dose: 50 Gy
Reference Point Dosage Given to Date: 8 Gy
Reference Point Session Dosage Given: 2 Gy
Session Number: 4

## 2022-03-19 ENCOUNTER — Encounter: Payer: Self-pay | Admitting: *Deleted

## 2022-03-19 ENCOUNTER — Ambulatory Visit
Admission: RE | Admit: 2022-03-19 | Discharge: 2022-03-19 | Disposition: A | Discharge status: Home / Self Care | Payer: PPO | Source: Ambulatory Visit | Attending: Radiation Oncology | Admitting: Radiation Oncology

## 2022-03-19 ENCOUNTER — Inpatient Hospital Stay: Payer: PPO

## 2022-03-19 ENCOUNTER — Other Ambulatory Visit: Payer: Self-pay

## 2022-03-19 DIAGNOSIS — Z510 Encounter for antineoplastic radiation therapy: Secondary | ICD-10-CM | POA: Diagnosis not present

## 2022-03-19 DIAGNOSIS — C541 Malignant neoplasm of endometrium: Secondary | ICD-10-CM | POA: Diagnosis not present

## 2022-03-19 LAB — RAD ONC ARIA SESSION SUMMARY
Course Elapsed Days: 6
Plan Fractions Treated to Date: 5
Plan Prescribed Dose Per Fraction: 2 Gy
Plan Total Fractions Prescribed: 25
Plan Total Prescribed Dose: 50 Gy
Reference Point Dosage Given to Date: 10 Gy
Reference Point Session Dosage Given: 2 Gy
Session Number: 5

## 2022-03-20 ENCOUNTER — Ambulatory Visit
Admission: RE | Admit: 2022-03-20 | Discharge: 2022-03-20 | Disposition: A | Discharge status: Home / Self Care | Payer: PPO | Source: Ambulatory Visit | Attending: Radiation Oncology | Admitting: Radiation Oncology

## 2022-03-20 ENCOUNTER — Other Ambulatory Visit: Payer: Self-pay

## 2022-03-20 ENCOUNTER — Inpatient Hospital Stay: Payer: PPO

## 2022-03-20 DIAGNOSIS — C541 Malignant neoplasm of endometrium: Secondary | ICD-10-CM | POA: Diagnosis not present

## 2022-03-20 DIAGNOSIS — Z510 Encounter for antineoplastic radiation therapy: Secondary | ICD-10-CM | POA: Diagnosis not present

## 2022-03-20 LAB — RAD ONC ARIA SESSION SUMMARY
Course Elapsed Days: 7
Plan Fractions Treated to Date: 6
Plan Prescribed Dose Per Fraction: 2 Gy
Plan Total Fractions Prescribed: 25
Plan Total Prescribed Dose: 50 Gy
Reference Point Dosage Given to Date: 12 Gy
Reference Point Session Dosage Given: 2 Gy
Session Number: 6

## 2022-03-21 ENCOUNTER — Other Ambulatory Visit: Payer: Self-pay

## 2022-03-21 ENCOUNTER — Ambulatory Visit
Admission: RE | Admit: 2022-03-21 | Discharge: 2022-03-21 | Disposition: A | Discharge status: Home / Self Care | Payer: PPO | Source: Ambulatory Visit | Attending: Radiation Oncology | Admitting: Radiation Oncology

## 2022-03-21 ENCOUNTER — Telehealth: Payer: Self-pay

## 2022-03-21 ENCOUNTER — Inpatient Hospital Stay: Payer: PPO

## 2022-03-21 DIAGNOSIS — Z510 Encounter for antineoplastic radiation therapy: Secondary | ICD-10-CM | POA: Diagnosis not present

## 2022-03-21 DIAGNOSIS — C541 Malignant neoplasm of endometrium: Secondary | ICD-10-CM | POA: Diagnosis not present

## 2022-03-21 LAB — RAD ONC ARIA SESSION SUMMARY
Course Elapsed Days: 8
Plan Fractions Treated to Date: 7
Plan Prescribed Dose Per Fraction: 2 Gy
Plan Total Fractions Prescribed: 25
Plan Total Prescribed Dose: 50 Gy
Reference Point Dosage Given to Date: 14 Gy
Reference Point Session Dosage Given: 2 Gy
Session Number: 7

## 2022-03-21 NOTE — Telephone Encounter (Signed)
Entry created in error, please disregard.  Jeral Fruit, RN 03/21/22 9:17 AM

## 2022-03-26 ENCOUNTER — Encounter: Payer: Self-pay | Admitting: *Deleted

## 2022-03-26 ENCOUNTER — Ambulatory Visit
Admission: RE | Admit: 2022-03-26 | Discharge: 2022-03-26 | Disposition: A | Discharge status: Home / Self Care | Payer: PPO | Source: Ambulatory Visit | Attending: Radiation Oncology | Admitting: Radiation Oncology

## 2022-03-26 ENCOUNTER — Inpatient Hospital Stay: Payer: PPO

## 2022-03-26 ENCOUNTER — Other Ambulatory Visit: Payer: Self-pay

## 2022-03-26 DIAGNOSIS — Z510 Encounter for antineoplastic radiation therapy: Secondary | ICD-10-CM | POA: Diagnosis not present

## 2022-03-26 DIAGNOSIS — C541 Malignant neoplasm of endometrium: Secondary | ICD-10-CM | POA: Diagnosis not present

## 2022-03-26 LAB — RAD ONC ARIA SESSION SUMMARY
Course Elapsed Days: 13
Plan Fractions Treated to Date: 8
Plan Prescribed Dose Per Fraction: 2 Gy
Plan Total Fractions Prescribed: 25
Plan Total Prescribed Dose: 50 Gy
Reference Point Dosage Given to Date: 16 Gy
Reference Point Session Dosage Given: 2 Gy
Session Number: 8

## 2022-03-27 ENCOUNTER — Ambulatory Visit
Admission: RE | Admit: 2022-03-27 | Discharge: 2022-03-27 | Disposition: A | Discharge status: Home / Self Care | Payer: PPO | Source: Ambulatory Visit | Attending: Radiation Oncology | Admitting: Radiation Oncology

## 2022-03-27 ENCOUNTER — Inpatient Hospital Stay: Payer: PPO

## 2022-03-27 ENCOUNTER — Other Ambulatory Visit: Payer: Self-pay

## 2022-03-27 DIAGNOSIS — Z510 Encounter for antineoplastic radiation therapy: Secondary | ICD-10-CM | POA: Diagnosis not present

## 2022-03-27 DIAGNOSIS — C541 Malignant neoplasm of endometrium: Secondary | ICD-10-CM | POA: Diagnosis not present

## 2022-03-27 LAB — RAD ONC ARIA SESSION SUMMARY
Course Elapsed Days: 14
Plan Fractions Treated to Date: 9
Plan Prescribed Dose Per Fraction: 2 Gy
Plan Total Fractions Prescribed: 25
Plan Total Prescribed Dose: 50 Gy
Reference Point Dosage Given to Date: 18 Gy
Reference Point Session Dosage Given: 2 Gy
Session Number: 9

## 2022-03-28 ENCOUNTER — Inpatient Hospital Stay: Payer: PPO

## 2022-03-28 ENCOUNTER — Other Ambulatory Visit: Payer: Self-pay

## 2022-03-28 ENCOUNTER — Ambulatory Visit
Admission: RE | Admit: 2022-03-28 | Discharge: 2022-03-28 | Disposition: A | Discharge status: Home / Self Care | Payer: PPO | Source: Ambulatory Visit | Attending: Radiation Oncology | Admitting: Radiation Oncology

## 2022-03-28 DIAGNOSIS — C541 Malignant neoplasm of endometrium: Secondary | ICD-10-CM | POA: Diagnosis not present

## 2022-03-28 DIAGNOSIS — Z510 Encounter for antineoplastic radiation therapy: Secondary | ICD-10-CM | POA: Diagnosis not present

## 2022-03-28 LAB — RAD ONC ARIA SESSION SUMMARY
Course Elapsed Days: 15
Plan Fractions Treated to Date: 10
Plan Prescribed Dose Per Fraction: 2 Gy
Plan Total Fractions Prescribed: 25
Plan Total Prescribed Dose: 50 Gy
Reference Point Dosage Given to Date: 20 Gy
Reference Point Session Dosage Given: 2 Gy
Session Number: 10

## 2022-03-29 ENCOUNTER — Inpatient Hospital Stay: Payer: PPO

## 2022-03-29 ENCOUNTER — Ambulatory Visit
Admission: RE | Admit: 2022-03-29 | Discharge: 2022-03-29 | Disposition: A | Discharge status: Home / Self Care | Payer: PPO | Source: Ambulatory Visit | Attending: Radiation Oncology | Admitting: Radiation Oncology

## 2022-03-29 ENCOUNTER — Encounter: Payer: Self-pay | Admitting: *Deleted

## 2022-03-29 ENCOUNTER — Other Ambulatory Visit: Payer: Self-pay

## 2022-03-29 DIAGNOSIS — Z510 Encounter for antineoplastic radiation therapy: Secondary | ICD-10-CM | POA: Diagnosis not present

## 2022-03-29 DIAGNOSIS — C541 Malignant neoplasm of endometrium: Secondary | ICD-10-CM | POA: Diagnosis not present

## 2022-03-29 LAB — RAD ONC ARIA SESSION SUMMARY
Course Elapsed Days: 16
Plan Fractions Treated to Date: 11
Plan Prescribed Dose Per Fraction: 2 Gy
Plan Total Fractions Prescribed: 25
Plan Total Prescribed Dose: 50 Gy
Reference Point Dosage Given to Date: 22 Gy
Reference Point Session Dosage Given: 2 Gy
Session Number: 11

## 2022-03-30 ENCOUNTER — Other Ambulatory Visit: Payer: Self-pay

## 2022-03-30 ENCOUNTER — Ambulatory Visit
Admission: RE | Admit: 2022-03-30 | Discharge: 2022-03-30 | Disposition: A | Discharge status: Home / Self Care | Payer: PPO | Source: Ambulatory Visit | Attending: Radiation Oncology | Admitting: Radiation Oncology

## 2022-03-30 ENCOUNTER — Inpatient Hospital Stay: Payer: PPO

## 2022-03-30 ENCOUNTER — Encounter: Payer: Self-pay | Admitting: *Deleted

## 2022-03-30 DIAGNOSIS — C541 Malignant neoplasm of endometrium: Secondary | ICD-10-CM | POA: Insufficient documentation

## 2022-03-30 DIAGNOSIS — Z510 Encounter for antineoplastic radiation therapy: Secondary | ICD-10-CM | POA: Insufficient documentation

## 2022-03-30 LAB — RAD ONC ARIA SESSION SUMMARY
Course Elapsed Days: 17
Plan Fractions Treated to Date: 12
Plan Prescribed Dose Per Fraction: 2 Gy
Plan Total Fractions Prescribed: 25
Plan Total Prescribed Dose: 50 Gy
Reference Point Dosage Given to Date: 24 Gy
Reference Point Session Dosage Given: 2 Gy
Session Number: 12

## 2022-04-02 ENCOUNTER — Encounter: Payer: Self-pay | Admitting: *Deleted

## 2022-04-02 ENCOUNTER — Inpatient Hospital Stay: Payer: PPO

## 2022-04-02 ENCOUNTER — Other Ambulatory Visit: Payer: Self-pay

## 2022-04-02 ENCOUNTER — Ambulatory Visit
Admission: RE | Admit: 2022-04-02 | Discharge: 2022-04-02 | Disposition: A | Discharge status: Home / Self Care | Payer: PPO | Source: Ambulatory Visit | Attending: Radiation Oncology | Admitting: Radiation Oncology

## 2022-04-02 DIAGNOSIS — Z510 Encounter for antineoplastic radiation therapy: Secondary | ICD-10-CM | POA: Diagnosis not present

## 2022-04-02 DIAGNOSIS — C541 Malignant neoplasm of endometrium: Secondary | ICD-10-CM | POA: Diagnosis not present

## 2022-04-02 LAB — RAD ONC ARIA SESSION SUMMARY
Course Elapsed Days: 20
Plan Fractions Treated to Date: 13
Plan Prescribed Dose Per Fraction: 2 Gy
Plan Total Fractions Prescribed: 25
Plan Total Prescribed Dose: 50 Gy
Reference Point Dosage Given to Date: 26 Gy
Reference Point Session Dosage Given: 2 Gy
Session Number: 13

## 2022-04-03 ENCOUNTER — Ambulatory Visit
Admission: RE | Admit: 2022-04-03 | Discharge: 2022-04-03 | Disposition: A | Discharge status: Home / Self Care | Payer: PPO | Source: Ambulatory Visit | Attending: Radiation Oncology | Admitting: Radiation Oncology

## 2022-04-03 ENCOUNTER — Inpatient Hospital Stay: Payer: PPO

## 2022-04-03 ENCOUNTER — Other Ambulatory Visit: Payer: Self-pay

## 2022-04-03 DIAGNOSIS — Z510 Encounter for antineoplastic radiation therapy: Secondary | ICD-10-CM | POA: Diagnosis not present

## 2022-04-03 DIAGNOSIS — C541 Malignant neoplasm of endometrium: Secondary | ICD-10-CM | POA: Diagnosis not present

## 2022-04-03 LAB — RAD ONC ARIA SESSION SUMMARY
Course Elapsed Days: 21
Plan Fractions Treated to Date: 14
Plan Prescribed Dose Per Fraction: 2 Gy
Plan Total Fractions Prescribed: 25
Plan Total Prescribed Dose: 50 Gy
Reference Point Dosage Given to Date: 28 Gy
Reference Point Session Dosage Given: 2 Gy
Session Number: 14

## 2022-04-04 ENCOUNTER — Encounter: Payer: Self-pay | Admitting: *Deleted

## 2022-04-04 ENCOUNTER — Inpatient Hospital Stay: Payer: PPO

## 2022-04-04 ENCOUNTER — Other Ambulatory Visit: Payer: Self-pay

## 2022-04-04 ENCOUNTER — Ambulatory Visit
Admission: RE | Admit: 2022-04-04 | Discharge: 2022-04-04 | Disposition: A | Discharge status: Home / Self Care | Payer: PPO | Source: Ambulatory Visit | Attending: Radiation Oncology | Admitting: Radiation Oncology

## 2022-04-04 DIAGNOSIS — C541 Malignant neoplasm of endometrium: Secondary | ICD-10-CM | POA: Diagnosis not present

## 2022-04-04 DIAGNOSIS — Z510 Encounter for antineoplastic radiation therapy: Secondary | ICD-10-CM | POA: Diagnosis not present

## 2022-04-04 LAB — RAD ONC ARIA SESSION SUMMARY
Course Elapsed Days: 22
Plan Fractions Treated to Date: 15
Plan Prescribed Dose Per Fraction: 2 Gy
Plan Total Fractions Prescribed: 25
Plan Total Prescribed Dose: 50 Gy
Reference Point Dosage Given to Date: 30 Gy
Reference Point Session Dosage Given: 2 Gy
Session Number: 15

## 2022-04-05 ENCOUNTER — Inpatient Hospital Stay: Payer: PPO

## 2022-04-05 ENCOUNTER — Ambulatory Visit
Admission: RE | Admit: 2022-04-05 | Discharge: 2022-04-05 | Disposition: A | Discharge status: Home / Self Care | Payer: PPO | Source: Ambulatory Visit | Attending: Radiation Oncology | Admitting: Radiation Oncology

## 2022-04-05 ENCOUNTER — Other Ambulatory Visit: Payer: Self-pay

## 2022-04-05 DIAGNOSIS — C541 Malignant neoplasm of endometrium: Secondary | ICD-10-CM | POA: Diagnosis not present

## 2022-04-05 DIAGNOSIS — Z510 Encounter for antineoplastic radiation therapy: Secondary | ICD-10-CM | POA: Diagnosis not present

## 2022-04-05 LAB — RAD ONC ARIA SESSION SUMMARY
Course Elapsed Days: 23
Plan Fractions Treated to Date: 16
Plan Prescribed Dose Per Fraction: 2 Gy
Plan Total Fractions Prescribed: 25
Plan Total Prescribed Dose: 50 Gy
Reference Point Dosage Given to Date: 32 Gy
Reference Point Session Dosage Given: 2 Gy
Session Number: 16

## 2022-04-06 ENCOUNTER — Inpatient Hospital Stay: Payer: PPO

## 2022-04-06 ENCOUNTER — Ambulatory Visit
Admission: RE | Admit: 2022-04-06 | Discharge: 2022-04-06 | Disposition: A | Discharge status: Home / Self Care | Payer: PPO | Source: Ambulatory Visit | Attending: Radiation Oncology | Admitting: Radiation Oncology

## 2022-04-06 ENCOUNTER — Encounter: Payer: Self-pay | Admitting: *Deleted

## 2022-04-06 ENCOUNTER — Other Ambulatory Visit: Payer: Self-pay

## 2022-04-06 DIAGNOSIS — Z510 Encounter for antineoplastic radiation therapy: Secondary | ICD-10-CM | POA: Diagnosis not present

## 2022-04-06 DIAGNOSIS — C541 Malignant neoplasm of endometrium: Secondary | ICD-10-CM | POA: Diagnosis not present

## 2022-04-06 LAB — RAD ONC ARIA SESSION SUMMARY
Course Elapsed Days: 24
Plan Fractions Treated to Date: 17
Plan Prescribed Dose Per Fraction: 2 Gy
Plan Total Fractions Prescribed: 25
Plan Total Prescribed Dose: 50 Gy
Reference Point Dosage Given to Date: 34 Gy
Reference Point Session Dosage Given: 2 Gy
Session Number: 17

## 2022-04-09 ENCOUNTER — Inpatient Hospital Stay: Payer: PPO

## 2022-04-09 ENCOUNTER — Other Ambulatory Visit: Payer: Self-pay

## 2022-04-09 ENCOUNTER — Ambulatory Visit
Admission: RE | Admit: 2022-04-09 | Discharge: 2022-04-09 | Disposition: A | Discharge status: Home / Self Care | Payer: PPO | Source: Ambulatory Visit | Attending: Radiation Oncology | Admitting: Radiation Oncology

## 2022-04-09 DIAGNOSIS — C541 Malignant neoplasm of endometrium: Secondary | ICD-10-CM | POA: Diagnosis not present

## 2022-04-09 DIAGNOSIS — Z510 Encounter for antineoplastic radiation therapy: Secondary | ICD-10-CM | POA: Diagnosis not present

## 2022-04-09 LAB — RAD ONC ARIA SESSION SUMMARY
Course Elapsed Days: 27
Plan Fractions Treated to Date: 18
Plan Prescribed Dose Per Fraction: 2 Gy
Plan Total Fractions Prescribed: 25
Plan Total Prescribed Dose: 50 Gy
Reference Point Dosage Given to Date: 36 Gy
Reference Point Session Dosage Given: 2 Gy
Session Number: 18

## 2022-04-10 ENCOUNTER — Inpatient Hospital Stay: Payer: PPO

## 2022-04-10 ENCOUNTER — Other Ambulatory Visit: Payer: Self-pay

## 2022-04-10 ENCOUNTER — Ambulatory Visit
Admission: RE | Admit: 2022-04-10 | Discharge: 2022-04-10 | Disposition: A | Discharge status: Home / Self Care | Payer: PPO | Source: Ambulatory Visit | Attending: Radiation Oncology | Admitting: Radiation Oncology

## 2022-04-10 DIAGNOSIS — C541 Malignant neoplasm of endometrium: Secondary | ICD-10-CM | POA: Diagnosis not present

## 2022-04-10 DIAGNOSIS — Z510 Encounter for antineoplastic radiation therapy: Secondary | ICD-10-CM | POA: Diagnosis not present

## 2022-04-10 LAB — RAD ONC ARIA SESSION SUMMARY
Course Elapsed Days: 28
Plan Fractions Treated to Date: 19
Plan Prescribed Dose Per Fraction: 2 Gy
Plan Total Fractions Prescribed: 25
Plan Total Prescribed Dose: 50 Gy
Reference Point Dosage Given to Date: 38 Gy
Reference Point Session Dosage Given: 2 Gy
Session Number: 19

## 2022-04-11 ENCOUNTER — Inpatient Hospital Stay: Payer: PPO

## 2022-04-11 ENCOUNTER — Other Ambulatory Visit: Payer: Self-pay

## 2022-04-11 ENCOUNTER — Ambulatory Visit
Admission: RE | Admit: 2022-04-11 | Discharge: 2022-04-11 | Disposition: A | Discharge status: Home / Self Care | Payer: PPO | Source: Ambulatory Visit | Attending: Radiation Oncology | Admitting: Radiation Oncology

## 2022-04-11 DIAGNOSIS — Z510 Encounter for antineoplastic radiation therapy: Secondary | ICD-10-CM | POA: Diagnosis not present

## 2022-04-11 DIAGNOSIS — C541 Malignant neoplasm of endometrium: Secondary | ICD-10-CM | POA: Diagnosis not present

## 2022-04-11 LAB — RAD ONC ARIA SESSION SUMMARY
Course Elapsed Days: 29
Plan Fractions Treated to Date: 20
Plan Prescribed Dose Per Fraction: 2 Gy
Plan Total Fractions Prescribed: 25
Plan Total Prescribed Dose: 50 Gy
Reference Point Dosage Given to Date: 40 Gy
Reference Point Session Dosage Given: 2 Gy
Session Number: 20

## 2022-04-12 ENCOUNTER — Inpatient Hospital Stay: Payer: PPO

## 2022-04-12 ENCOUNTER — Ambulatory Visit
Admission: RE | Admit: 2022-04-12 | Discharge: 2022-04-12 | Disposition: A | Discharge status: Home / Self Care | Payer: PPO | Source: Ambulatory Visit | Attending: Radiation Oncology | Admitting: Radiation Oncology

## 2022-04-12 ENCOUNTER — Other Ambulatory Visit: Payer: Self-pay

## 2022-04-12 ENCOUNTER — Encounter: Payer: Self-pay | Admitting: *Deleted

## 2022-04-12 DIAGNOSIS — Z510 Encounter for antineoplastic radiation therapy: Secondary | ICD-10-CM | POA: Diagnosis not present

## 2022-04-12 DIAGNOSIS — C541 Malignant neoplasm of endometrium: Secondary | ICD-10-CM

## 2022-04-12 LAB — RAD ONC ARIA SESSION SUMMARY
Course Elapsed Days: 30
Plan Fractions Treated to Date: 21
Plan Prescribed Dose Per Fraction: 2 Gy
Plan Total Fractions Prescribed: 25
Plan Total Prescribed Dose: 50 Gy
Reference Point Dosage Given to Date: 42 Gy
Reference Point Session Dosage Given: 2 Gy
Session Number: 21

## 2022-04-12 LAB — CBC
HCT: 35.1 % — ABNORMAL LOW (ref 36.0–46.0)
Hemoglobin: 11.1 g/dL — ABNORMAL LOW (ref 12.0–15.0)
MCH: 26.9 pg (ref 26.0–34.0)
MCHC: 31.6 g/dL (ref 30.0–36.0)
MCV: 85.2 fL (ref 80.0–100.0)
Platelets: 163 10*3/uL (ref 150–400)
RBC: 4.12 MIL/uL (ref 3.87–5.11)
RDW: 14.8 % (ref 11.5–15.5)
WBC: 5.1 10*3/uL (ref 4.0–10.5)
nRBC: 0 % (ref 0.0–0.2)

## 2022-04-13 ENCOUNTER — Ambulatory Visit
Admission: RE | Admit: 2022-04-13 | Discharge: 2022-04-13 | Disposition: A | Discharge status: Home / Self Care | Payer: PPO | Source: Ambulatory Visit | Attending: Radiation Oncology | Admitting: Radiation Oncology

## 2022-04-13 ENCOUNTER — Other Ambulatory Visit: Payer: Self-pay

## 2022-04-13 ENCOUNTER — Inpatient Hospital Stay: Payer: PPO

## 2022-04-13 ENCOUNTER — Encounter: Payer: Self-pay | Admitting: *Deleted

## 2022-04-13 DIAGNOSIS — C541 Malignant neoplasm of endometrium: Secondary | ICD-10-CM | POA: Diagnosis not present

## 2022-04-13 DIAGNOSIS — Z510 Encounter for antineoplastic radiation therapy: Secondary | ICD-10-CM | POA: Diagnosis not present

## 2022-04-13 LAB — RAD ONC ARIA SESSION SUMMARY
Course Elapsed Days: 31
Plan Fractions Treated to Date: 22
Plan Prescribed Dose Per Fraction: 2 Gy
Plan Total Fractions Prescribed: 25
Plan Total Prescribed Dose: 50 Gy
Reference Point Dosage Given to Date: 44 Gy
Reference Point Session Dosage Given: 2 Gy
Session Number: 22

## 2022-04-16 ENCOUNTER — Ambulatory Visit
Admission: RE | Admit: 2022-04-16 | Discharge: 2022-04-16 | Disposition: A | Discharge status: Home / Self Care | Payer: PPO | Source: Ambulatory Visit | Attending: Radiation Oncology | Admitting: Radiation Oncology

## 2022-04-16 ENCOUNTER — Other Ambulatory Visit: Payer: Self-pay

## 2022-04-16 ENCOUNTER — Inpatient Hospital Stay: Payer: PPO

## 2022-04-16 ENCOUNTER — Encounter: Payer: Self-pay | Admitting: *Deleted

## 2022-04-16 DIAGNOSIS — C541 Malignant neoplasm of endometrium: Secondary | ICD-10-CM

## 2022-04-16 DIAGNOSIS — Z510 Encounter for antineoplastic radiation therapy: Secondary | ICD-10-CM | POA: Diagnosis not present

## 2022-04-16 LAB — CBC
HCT: 35.6 % — ABNORMAL LOW (ref 36.0–46.0)
Hemoglobin: 11.4 g/dL — ABNORMAL LOW (ref 12.0–15.0)
MCH: 26.7 pg (ref 26.0–34.0)
MCHC: 32 g/dL (ref 30.0–36.0)
MCV: 83.4 fL (ref 80.0–100.0)
Platelets: 198 10*3/uL (ref 150–400)
RBC: 4.27 MIL/uL (ref 3.87–5.11)
RDW: 15.1 % (ref 11.5–15.5)
WBC: 5 10*3/uL (ref 4.0–10.5)
nRBC: 0 % (ref 0.0–0.2)

## 2022-04-16 LAB — RAD ONC ARIA SESSION SUMMARY
Course Elapsed Days: 34
Plan Fractions Treated to Date: 23
Plan Prescribed Dose Per Fraction: 2 Gy
Plan Total Fractions Prescribed: 25
Plan Total Prescribed Dose: 50 Gy
Reference Point Dosage Given to Date: 46 Gy
Reference Point Session Dosage Given: 2 Gy
Session Number: 23

## 2022-04-17 ENCOUNTER — Ambulatory Visit
Admission: RE | Admit: 2022-04-17 | Discharge: 2022-04-17 | Disposition: A | Discharge status: Home / Self Care | Payer: PPO | Source: Ambulatory Visit | Attending: Radiation Oncology | Admitting: Radiation Oncology

## 2022-04-17 ENCOUNTER — Inpatient Hospital Stay: Payer: PPO

## 2022-04-17 ENCOUNTER — Encounter: Payer: Self-pay | Admitting: *Deleted

## 2022-04-17 ENCOUNTER — Other Ambulatory Visit: Payer: Self-pay

## 2022-04-17 ENCOUNTER — Other Ambulatory Visit: Payer: Self-pay | Admitting: *Deleted

## 2022-04-17 DIAGNOSIS — Z510 Encounter for antineoplastic radiation therapy: Secondary | ICD-10-CM | POA: Diagnosis not present

## 2022-04-17 DIAGNOSIS — C541 Malignant neoplasm of endometrium: Secondary | ICD-10-CM

## 2022-04-17 LAB — RAD ONC ARIA SESSION SUMMARY
Course Elapsed Days: 35
Plan Fractions Treated to Date: 24
Plan Prescribed Dose Per Fraction: 2 Gy
Plan Total Fractions Prescribed: 25
Plan Total Prescribed Dose: 50 Gy
Reference Point Dosage Given to Date: 48 Gy
Reference Point Session Dosage Given: 2 Gy
Session Number: 24

## 2022-04-18 ENCOUNTER — Ambulatory Visit
Admission: RE | Admit: 2022-04-18 | Discharge: 2022-04-18 | Disposition: A | Discharge status: Home / Self Care | Payer: PPO | Source: Ambulatory Visit | Attending: Radiation Oncology | Admitting: Radiation Oncology

## 2022-04-18 ENCOUNTER — Other Ambulatory Visit: Payer: Self-pay

## 2022-04-18 ENCOUNTER — Encounter: Payer: Self-pay | Admitting: *Deleted

## 2022-04-18 ENCOUNTER — Inpatient Hospital Stay: Payer: PPO

## 2022-04-18 DIAGNOSIS — C541 Malignant neoplasm of endometrium: Secondary | ICD-10-CM | POA: Diagnosis not present

## 2022-04-18 DIAGNOSIS — Z510 Encounter for antineoplastic radiation therapy: Secondary | ICD-10-CM | POA: Diagnosis not present

## 2022-04-18 LAB — RAD ONC ARIA SESSION SUMMARY
Course Elapsed Days: 36
Plan Fractions Treated to Date: 25
Plan Prescribed Dose Per Fraction: 2 Gy
Plan Total Fractions Prescribed: 25
Plan Total Prescribed Dose: 50 Gy
Reference Point Dosage Given to Date: 50 Gy
Reference Point Session Dosage Given: 2 Gy
Session Number: 25

## 2022-05-02 DIAGNOSIS — C541 Malignant neoplasm of endometrium: Secondary | ICD-10-CM | POA: Diagnosis not present

## 2022-05-02 DIAGNOSIS — I350 Nonrheumatic aortic (valve) stenosis: Secondary | ICD-10-CM | POA: Diagnosis not present

## 2022-05-02 DIAGNOSIS — E782 Mixed hyperlipidemia: Secondary | ICD-10-CM | POA: Diagnosis not present

## 2022-05-02 DIAGNOSIS — I10 Essential (primary) hypertension: Secondary | ICD-10-CM | POA: Diagnosis not present

## 2022-05-15 ENCOUNTER — Inpatient Hospital Stay: Payer: PPO | Attending: Obstetrics and Gynecology

## 2022-05-15 ENCOUNTER — Ambulatory Visit
Admission: RE | Admit: 2022-05-15 | Discharge: 2022-05-15 | Disposition: A | Discharge status: Home / Self Care | Payer: PPO | Source: Ambulatory Visit | Attending: Radiation Oncology | Admitting: Radiation Oncology

## 2022-05-15 DIAGNOSIS — S2243XA Multiple fractures of ribs, bilateral, initial encounter for closed fracture: Secondary | ICD-10-CM | POA: Diagnosis not present

## 2022-05-15 DIAGNOSIS — C541 Malignant neoplasm of endometrium: Secondary | ICD-10-CM | POA: Insufficient documentation

## 2022-05-15 DIAGNOSIS — I771 Stricture of artery: Secondary | ICD-10-CM | POA: Diagnosis not present

## 2022-05-15 DIAGNOSIS — K3189 Other diseases of stomach and duodenum: Secondary | ICD-10-CM | POA: Diagnosis not present

## 2022-05-15 DIAGNOSIS — Z87891 Personal history of nicotine dependence: Secondary | ICD-10-CM | POA: Insufficient documentation

## 2022-05-15 DIAGNOSIS — Z923 Personal history of irradiation: Secondary | ICD-10-CM | POA: Insufficient documentation

## 2022-05-15 DIAGNOSIS — D7389 Other diseases of spleen: Secondary | ICD-10-CM | POA: Diagnosis not present

## 2022-05-15 DIAGNOSIS — K449 Diaphragmatic hernia without obstruction or gangrene: Secondary | ICD-10-CM | POA: Diagnosis not present

## 2022-05-15 DIAGNOSIS — R599 Enlarged lymph nodes, unspecified: Secondary | ICD-10-CM | POA: Insufficient documentation

## 2022-05-15 DIAGNOSIS — S2242XA Multiple fractures of ribs, left side, initial encounter for closed fracture: Secondary | ICD-10-CM | POA: Diagnosis not present

## 2022-05-15 LAB — POCT I-STAT CREATININE: Creatinine, Ser: 0.9 mg/dL (ref 0.44–1.00)

## 2022-05-15 MED ORDER — IOHEXOL 300 MG/ML  SOLN
100.0000 mL | Freq: Once | INTRAMUSCULAR | Status: AC | PRN
  Administered 2022-05-15: 100 mL via INTRAVENOUS

## 2022-05-21 ENCOUNTER — Encounter: Payer: Self-pay | Admitting: Obstetrics and Gynecology

## 2022-05-23 ENCOUNTER — Inpatient Hospital Stay: Payer: PPO

## 2022-05-23 ENCOUNTER — Encounter: Payer: Self-pay | Admitting: Obstetrics and Gynecology

## 2022-05-23 ENCOUNTER — Inpatient Hospital Stay (HOSPITAL_BASED_OUTPATIENT_CLINIC_OR_DEPARTMENT_OTHER): Payer: PPO | Admitting: Obstetrics and Gynecology

## 2022-05-23 VITALS — BP 195/68 | HR 60 | Temp 97.6°F | Resp 20 | Wt 202.1 lb

## 2022-05-23 DIAGNOSIS — R599 Enlarged lymph nodes, unspecified: Secondary | ICD-10-CM | POA: Diagnosis not present

## 2022-05-23 DIAGNOSIS — Z87891 Personal history of nicotine dependence: Secondary | ICD-10-CM | POA: Diagnosis not present

## 2022-05-23 DIAGNOSIS — Z923 Personal history of irradiation: Secondary | ICD-10-CM | POA: Diagnosis not present

## 2022-05-23 DIAGNOSIS — C541 Malignant neoplasm of endometrium: Secondary | ICD-10-CM | POA: Diagnosis not present

## 2022-05-23 NOTE — Progress Notes (Signed)
Gynecologic Oncology Consult Visit   Referring Provider: Dr. 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 female who is seen in consultation from Dr. Glennon Mac for locally advanced endometrial cancer, now s/p radiation, who returns to clinic for discussion of imaging results and further management.   She received primary pelvic external radiation 50 Gy, completed 04/18/22.   05/15/22- CT Chest abdomen Pelvis w contrast IMPRESSION: 1. Central uterine hypoattenuation likely represents residual endometrial primary. 2. Borderline sized common iliac nodes are similar to the prior PET and were not hypermetabolic on that exam. Favored to be reactive. Otherwise, no evidence of metastatic disease in the abdomen or pelvis. 3. Scattered tiny pulmonary nodules are subpleural predominant and favored to represent subpleural lymph nodes. These can be re-evaluated at follow-up. 4.  Tiny hiatal hernia. 5. Interval nonacute anterior left third and fourth rib fractures. 6. Aortic atherosclerosis (ICD10-I70.0) and emphysema (ICD10-J43.9).  She continues to have vaginal bleeding.   Gyn Oncology History She was referred by Dr. Doy Hutching to Dr. Glennon Mac for reports of postmenopausal bleeding for 6 months, may be more.  Scant.  Became heavier. Additionally has urinary incontinence.    On exam, friable cervical vs vaginal mass was partially visualized though exam was limited. Biopsy was performed.   Cervical Biopsy: poorly differentiated carcinoma, favor endometrial origin. Positive for p53 and ER. Focal staining for p16, negative for napsin and p63. HER2- 1+ negative. MSI/MMR- Stable  Colonoscopy was scheduled but prep inadequate.    PET/CT EXAM: 10/23 Marked diffuse hypermetabolic activity throughout the entire uterus and cervix, consistent with malignancy. This favors endometrial carcinoma over cervical  carcinoma. Borderline enlarged bilateral common iliac lymph nodes noted, but without FDG uptake. No definite evidence of metastatic disease by PET.  Patient seen 01/24/22 and found to have extensive cervical and upper vaginal involvement.  Only having light bleeding.  Decision made to treat her with primary radiation therapy because of extensive involvement of cervix and adjacent vagina, and she was reluctant to consider chemotherapy.  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   Hyperlipemia, mixed 04/05/2015   HTN, goal below 140/80 04/05/2015   Obesity (BMI 30-39.9) 04/05/2015   Type 2 diabetes mellitus with complication, with long-term current use of insulin ([place]) 04/05/2015   Class 2 severe obesity due to excess calories with serious comorbidity in adult ([place]) 08/19/2013   Obesity, Class II, BMI 35-39.9, with comorbidity 08/19/2013   Chronic obstructive pulmonary disease, unspecified ([place]) 07/31/2012   Claudication ([place]) 03/12/2012   Tobacco use 03/12/2012   Hyperlipidemia associated with type 2 diabetes mellitus ([place]) 06/06/2011   Hypertension associated with diabetes ([place]) 06/06/2011   Major depression 06/06/2011   Type 2 diabetes mellitus without complication ([place]) 68/34/1963    Past Medical History: Past Medical History:  Diagnosis Date   Adenomatous colon polyp    Aortic stenosis    Arthritis    Asthma    Claudication (HCC)    COPD (chronic obstructive pulmonary disease) ([place])    Depression    Diabetes mellitus without complication ([place])    Esophageal dysphagia    GERD (gastroesophageal reflux disease)  Hyperlipemia    Hypertension    Obesity    Severe obesity (BMI  35.0-35.9 with comorbidity) ([place])     Past Surgical History: Past Surgical History:  Procedure Laterality Date   BLADDER SURGERY     COLONOSCOPY WITH PROPOFOL N/A 08/05/2015   Procedure: COLONOSCOPY WITH PROPOFOL;  Surgeon: Josefine Class, MD;  Location[facility] ENDOSCOPY;  Service: Endoscopy;  Laterality: N/A;   ESOPHAGOGASTRODUODENOSCOPY N/A 01/09/2022   Procedure: ESOPHAGOGASTRODUODENOSCOPY (EGD);  Surgeon: [name], MD;  Location: [facility] ENDOSCOPY;  Service: Endoscopy;  Laterality: N/A;   FOOT SURGERY Left    FRACTURE SURGERY     ORIF ANKLE FRACTURE Left 03/26/2017   Procedure: OPEN REDUCTION INTERNAL FIXATION (ORIF) ANKLE FRACTURE;  Surgeon: Corky Mull, MD;  Location: [facility] ORS;  Service: Orthopedics;  Laterality: Left;    OB History:  G17P0 OB History  No obstetric history on file.    Family History: Family History  Problem Relation Age of Onset   CVA Mother    Diabetes Mother    CAD Father    Breast cancer Neg Hx     Social History: Social History   Socioeconomic History   Marital status: Single    Spouse name: Not on file   Number of children: Not on file   Years of education: Not on file   Highest education level: Not on file  Occupational History   Not on file  Tobacco Use   Smoking status: Former    Packs/day: 1.00    Years: 40.00    Total pack years: 40.00    Types: Cigarettes   Smokeless tobacco: Never  Vaping Use   Vaping Use: Never used  Substance and Sexual Activity   Alcohol use: No   Drug use: No   Sexual activity: Not on file  Other Topics Concern   Not on file  Social History Narrative   Not on file   Social Determinants of Health   Financial Resource Strain: Not on file  Food Insecurity: Not on file  Transportation Needs: Unmet Transportation Needs (04/18/2022)   PRAPARE - Hydrologist (Medical): Yes    Lack of Transportation (Non-Medical): Yes  Physical Activity: Not on file  Stress:  Not on file  Social Connections: Not on file  Intimate Partner Violence: Not on file    Allergies: No Known Allergies  Current Medications: Current Outpatient Medications  Medication Sig Dispense Refill   albuterol (PROVENTIL HFA;VENTOLIN HFA) 108 (90 Base) MCG/ACT inhaler Inhale 2 puffs into the lungs every 6 (six) hours as needed for wheezing or shortness of breath.     amLODipine (NORVASC) 10 MG tablet Take 10 mg by mouth daily.     atenolol (TENORMIN) 25 MG tablet Take 25 mg by mouth daily.     atorvastatin (LIPITOR) 80 MG tablet Take 80 mg by mouth daily.     buPROPion (WELLBUTRIN XL) 300 MG 24 hr tablet Take 300 mg by mouth daily.     cholecalciferol (VITAMIN D) 400 units TABS tablet Take 400 Units by mouth daily.     insulin aspart (NOVOLOG) 100 UNIT/ML injection Inject 13 Units into the skin 3 (three) times daily before meals.     insulin detemir (LEVEMIR FLEXPEN) 100 UNIT/ML FlexPen Inject 25 units in the morning. Inject 23 units in the evening.     losartan-hydrochlorothiazide (HYZAAR) 100-25 MG tablet Take 1 tablet by mouth daily.     metFORMIN (GLUCOPHAGE) 500 MG tablet Take  1,000 mg by mouth 2 (two) times daily with a meal.     montelukast (SINGULAIR) 10 MG tablet Take 10 mg by mouth at bedtime.     aspirin EC 81 MG tablet Take 81 mg by mouth daily. (Patient not taking: Reported on 05/23/2022)     enoxaparin (LOVENOX) 40 MG/0.4ML injection Inject 0.4 mLs (40 mg total) into the skin daily. (Patient not taking: Reported on 01/09/2022) 14 Syringe 0   insulin glargine (LANTUS) 100 UNIT/ML injection Inject 25 Units into the skin 2 (two) times daily. (Patient not taking: Reported on 02/21/2022)     oxyCODONE (OXY IR/ROXICODONE) 5 MG immediate release tablet Take 1 tablet (5 mg total) by mouth every 4 (four) hours as needed for moderate pain ((score 4 to 6)). (Patient not taking: Reported on 01/09/2022) 40 tablet 0   No current facility-administered medications for this visit.     Review of Systems General:  no complaints Skin: no complaints Eyes: no complaints HEENT: no complaints Breasts: no complaints Pulmonary: no complaints Cardiac: no complaints Gastrointestinal: no complaints Genitourinary/Sexual: vaginal bleeding Ob/Gyn: no complaints Musculoskeletal: no complaints Hematology: no complaints Neurologic/Psych: depression   Objective:  Physical Examination:  Wt 202 lb 1.6 oz (91.7 kg)   BMI 36.96 kg/m     ECOG Performance Status: 1 - Symptomatic but completely ambulatory  GENERAL: Patient is a well appearing female in no acute distress HEENT:  Sclera clear. Anicteric NODES:  Negative axillary, supraclavicular, inguinal lymph node survery LUNGS:  Clear to auscultation bilaterally.   HEART:  Regular rate and rhythm.  ABDOMEN:  Soft, nontender.  No hernias, incisions well healed. No masses or ascites EXTREMITIES:  No peripheral edema. Atraumatic. No cyanosis SKIN:  Clear with no obvious rashes or skin changes.  NEURO:  Nonfocal. Well oriented.  Appropriate affect.  Pelvic exam: Chaperoned by CMA EGBUS: no lesions Vagina: narrow with 3-4 cm fleshy cancer in upper vagina and cervix Uterus: normal size, nontender, mobile Adnexa: no palpable masses Rectovaginal: confirmatory  Lab Review No labs on site today  Imaging Review: PET scan 10/23   CT scan 1/24     Assessment:  Sherrell Weir is a 72 y.o. 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  PET scan shows involvement of entire uterus and cervix.  No distant disease. Bilateral common iliac nodes are borderline enlarged, but not PET positive.   Poorly differentiated cancer with TP53 overexpression and negative HPV test so unlikely to be cervical primary. She was hesitant for chemotherapy and elected for radiation in view of her locally advanced disease.  Completed radiation 04/18/22 and on exam today still has fleshy cancer  involving upper vagina and flush cervix.  CT scan continues to show hypoattenuation of uterus cw persistent disease, but no evidence of distant disease.  Cancer is TP53 mutated, MSS and HER2 negative.   Medical co-morbidities complicating care: obesity, COPD, aortic stenosis, PVD.  Plan:   Problem List Items Addressed This Visit       Genitourinary   Endometrial cancer [facility]) - Primary   Discussed options with patient including additional radiation with vaginal brachytherapy in view of locally advanced disease and she could see Dr Christel Mormon at [facility] to consider this option. Do not think that surgery is a viable option at this point in view of upper vaginal and cervical involvement.  It would probably require an anterior exenteration to get around the cancer, and she is not willing to consider this option.  We discussed that systemic therapy with carbo/taxol/keytruda may be the best option, as this can be highly effective and could shrink the cancer.  Surgery might be a possibility if she had a significant response.  Patient is now more amenable to considering chemotherapy and this would be my suggestion.  Will present at [facility] tumor board with the patient's permission.  Then will contact patient with suggested plan.     The patient's diagnosis, an outline of the further diagnostic and laboratory studies which will be required, the recommendation for surgery, and alternatives were discussed with her and her accompanying family members.  All questions were answered to their satisfaction.  Verlon Au, NP  I personally interviewed and examined the patient. Agreed with the above/below plan of care. I have directly contributed to assessment and plan of care of this patient and educated and discussed with patient and family.  Mellody Drown, MD    CC:  Doy Hutching Leonie Douglas, MD [place] [facility] [place],  [place] [address] [phone]

## 2022-05-29 ENCOUNTER — Telehealth: Payer: Self-pay | Admitting: *Deleted

## 2022-05-29 NOTE — Telephone Encounter (Signed)
Nurse placed call to patient to review appointment details for upcoming new oncology consultation visit. Nurse verified appointment time and details of visit. All questions answered. Patient verbalized understanding.

## 2022-05-30 ENCOUNTER — Telehealth: Payer: Self-pay | Admitting: *Deleted

## 2022-05-30 ENCOUNTER — Encounter: Payer: Self-pay | Admitting: Oncology

## 2022-05-30 ENCOUNTER — Ambulatory Visit: Payer: PPO | Admitting: Radiation Oncology

## 2022-05-30 ENCOUNTER — Inpatient Hospital Stay (HOSPITAL_BASED_OUTPATIENT_CLINIC_OR_DEPARTMENT_OTHER): Payer: PPO | Admitting: Oncology

## 2022-05-30 ENCOUNTER — Encounter: Payer: Self-pay | Admitting: *Deleted

## 2022-05-30 ENCOUNTER — Inpatient Hospital Stay: Payer: PPO

## 2022-05-30 VITALS — BP 147/93 | HR 58 | Temp 97.6°F | Resp 18 | Wt 201.6 lb

## 2022-05-30 DIAGNOSIS — C541 Malignant neoplasm of endometrium: Secondary | ICD-10-CM | POA: Diagnosis not present

## 2022-05-30 MED ORDER — PROCHLORPERAZINE MALEATE 10 MG PO TABS: 10.0000 mg | ORAL_TABLET | Freq: Four times a day (QID) | ORAL | 2 refills | Status: DC | PRN

## 2022-05-30 MED ORDER — ONDANSETRON HCL 8 MG PO TABS: 8.0000 mg | ORAL_TABLET | Freq: Three times a day (TID) | ORAL | 2 refills | Status: DC | PRN

## 2022-05-30 MED ORDER — LIDOCAINE-PRILOCAINE 2.5-2.5 % EX CREA: TOPICAL_CREAM | CUTANEOUS | 3 refills | Status: DC

## 2022-05-30 NOTE — Telephone Encounter (Signed)
Call placed to patient to review appointment details for port placement. Patient given appointment options of Feb 6th, 7th or 8th. Patient states she will take the appointment for Tuesday February 6th, arrive at 12:30 pm for 1:30 pm. Patient states she can arrange transportation for visit. Patient will call back to clinic if she cannot keep this appointment.

## 2022-05-30 NOTE — Progress Notes (Signed)
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  INTERVAL HISTORY: Patient is a 72 year old female who initially presented with postmenopausal bleeding.  Subsequent imaging and biopsy revealed the above-stated malignancy.  Initially she refused any surgical or chemotherapeutic intervention and underwent XRT only.  She continues to have persistent disease and has now agreed to initiate treatment.  She currently feels well and is asymptomatic.  She has no neurologic complaints.  She denies any recent fevers or illnesses.  She has a good appetite and denies weight loss.  She has no chest pain, shortness of breath, cough, or hemoptysis.  She denies any nausea, vomiting, constipation, or diarrhea.  She has no urinary complaints.  Patient offers no further specific complaints today.  REVIEW OF SYSTEMS:   Review of Systems  Constitutional: Negative.  Negative for fever, malaise/fatigue and weight loss.  Respiratory: Negative.  Negative for cough, hemoptysis and shortness of breath.   Cardiovascular: Negative.  Negative for chest pain and leg swelling.  Gastrointestinal: Negative.  Negative for abdominal pain, blood in stool and melena.  Genitourinary: Negative.  Negative for dysuria.  Musculoskeletal: Negative.  Negative for back pain.  Skin: Negative.  Negative for rash.  Neurological: Negative.  Negative for dizziness, focal weakness, weakness and headaches.  Psychiatric/Behavioral: Negative.  The patient is not nervous/anxious.     As per HPI. Otherwise, a complete review of systems is negative.  PAST MEDICAL HISTORY: Past Medical History:  Diagnosis Date   Adenomatous colon polyp     Aortic stenosis    Arthritis    Asthma    Claudication (HCC)    COPD (chronic obstructive pulmonary disease) ([place])    Depression    Diabetes mellitus without complication (HCC)    Esophageal dysphagia    GERD (gastroesophageal reflux disease)    Hyperlipemia    Hypertension    Obesity    Severe obesity (BMI 35.0-35.9 with comorbidity) ([place])     PAST SURGICAL HISTORY: Past Surgical History:  Procedure Laterality Date   BLADDER SURGERY     COLONOSCOPY WITH PROPOFOL N/A 08/05/2015   Procedure: COLONOSCOPY WITH PROPOFOL;  Surgeon: Josefine Class, MD;  Location: [facility] ENDOSCOPY;  Service: Endoscopy;  Laterality: N/A;   ESOPHAGOGASTRODUODENOSCOPY N/A 01/09/2022   Procedure: ESOPHAGOGASTRODUODENOSCOPY (EGD);  Surgeon: [name], MD;  Location: [facility] ENDOSCOPY;  Service: Endoscopy;  Laterality: N/A;   FOOT SURGERY Left    FRACTURE SURGERY     ORIF ANKLE FRACTURE Left 03/26/2017   Procedure: OPEN REDUCTION INTERNAL FIXATION (ORIF) ANKLE FRACTURE;  Surgeon: Corky Mull, MD;  Location: [facility] ORS;  Service: Orthopedics;  Laterality: Left;    FAMILY HISTORY: Family History  Problem Relation Age of Onset   CVA Mother    Diabetes Mother    CAD Father    Breast cancer Neg Hx     ADVANCED DIRECTIVES (Y/N):  N  HEALTH MAINTENANCE: Social History   Tobacco Use   Smoking status: Former    Packs/day: 1.00    Years: 40.00    Total pack years: 40.00    Types: Cigarettes   Smokeless tobacco: Never  Vaping Use   Vaping Use: Never used  Substance Use Topics   Alcohol use: No   Drug  use: No     Colonoscopy:  PAP:  Bone density:  Lipid panel:  No Known Allergies  Current Outpatient Medications  Medication Sig Dispense Refill   albuterol (PROVENTIL HFA;VENTOLIN HFA) 108 (90 Base) MCG/ACT inhaler Inhale 2 puffs into the lungs every 6 (six) hours as needed for wheezing or shortness of breath.     amLODipine (NORVASC) 10 MG tablet Take 10 mg by mouth daily.      atenolol (TENORMIN) 25 MG tablet Take 25 mg by mouth daily.     atorvastatin (LIPITOR) 80 MG tablet Take 80 mg by mouth daily.     buPROPion (WELLBUTRIN XL) 300 MG 24 hr tablet Take 300 mg by mouth daily.     cholecalciferol (VITAMIN D) 400 units TABS tablet Take 400 Units by mouth daily.     insulin aspart (NOVOLOG) 100 UNIT/ML injection Inject 13 Units into the skin 3 (three) times daily before meals.     insulin detemir (LEVEMIR FLEXPEN) 100 UNIT/ML FlexPen Inject 25 units in the morning. Inject 23 units in the evening.     losartan-hydrochlorothiazide (HYZAAR) 100-25 MG tablet Take 1 tablet by mouth daily.     metFORMIN (GLUCOPHAGE) 500 MG tablet Take 1,000 mg by mouth 2 (two) times daily with a meal.     montelukast (SINGULAIR) 10 MG tablet Take 10 mg by mouth at bedtime.     aspirin EC 81 MG tablet Take 81 mg by mouth daily. (Patient not taking: Reported on 05/23/2022)     enoxaparin (LOVENOX) 40 MG/0.4ML injection Inject 0.4 mLs (40 mg total) into the skin daily. (Patient not taking: Reported on 01/09/2022) 14 Syringe 0   insulin glargine (LANTUS) 100 UNIT/ML injection Inject 25 Units into the skin 2 (two) times daily. (Patient not taking: Reported on 02/21/2022)     lidocaine-prilocaine (EMLA) cream Apply to affected area once 30 g 3   ondansetron (ZOFRAN) 8 MG tablet Take 1 tablet (8 mg total) by mouth every 8 (eight) hours as needed for nausea or vomiting. Start on the third day after chemotherapy. 60 tablet 2   prochlorperazine (COMPAZINE) 10 MG tablet Take 1 tablet (10 mg total) by mouth every 6 (six) hours as needed for nausea or vomiting. 60 tablet 2   No current facility-administered medications for this visit.    OBJECTIVE: Vitals:   05/30/22 0919  BP: (!) 147/93  Pulse: (!) 58  Resp: 18  Temp: 97.6 F (36.4 C)  SpO2: 99%     Body mass index is 36.87 kg/m.    ECOG FS:0 - Asymptomatic  General: Well-developed, well-nourished, no acute distress. Eyes: Pink conjunctiva,  anicteric sclera. HEENT: Normocephalic, moist mucous membranes. Lungs: No audible wheezing or coughing. Heart: Regular rate and rhythm. Abdomen: Soft, nontender, no obvious distention. Musculoskeletal: No edema, cyanosis, or clubbing. Neuro: Alert, answering all questions appropriately. Cranial nerves grossly intact. Skin: No rashes or petechiae noted. Psych: Normal affect. Lymphatics: No cervical, calvicular, axillary or inguinal LAD.   LAB RESULTS:  Lab Results  Component Value Date   NA 136 03/27/2017   K 4.3 03/27/2017   CL 99 (L) 03/27/2017   CO2 27 03/27/2017   GLUCOSE 219 (H) 03/27/2017   BUN 26 (H) 03/27/2017   CREATININE 0.90 05/15/2022   CALCIUM 8.6 (L) 03/27/2017   GFRNONAA 54 (L) 03/27/2017   GFRAA >60 03/27/2017    Lab Results  Component Value Date   WBC 5.0 04/16/2022   NEUTROABS 9.7 (H) 03/27/2017   HGB 11.4 (L)  04/16/2022   HCT 35.6 (L) 04/16/2022   MCV 83.4 04/16/2022   PLT 198 04/16/2022     STUDIES: CT CHEST ABDOMEN PELVIS W CONTRAST  Result Date: 05/15/2022 CLINICAL DATA:  Status post radiation therapy for endometrial cancer. Persistent vaginal bleeding. Restaging. Remote smoker. * Tracking Code: BO * EXAM: CT CHEST, ABDOMEN, AND PELVIS WITH CONTRAST TECHNIQUE: Multidetector CT imaging of the chest, abdomen and pelvis was performed following the standard protocol during bolus administration of intravenous contrast. RADIATION DOSE REDUCTION: This exam was performed according to the departmental dose-optimization program which includes automated exposure control, adjustment of the mA and/or kV according to patient size and/or use of iterative reconstruction technique. CONTRAST:  182m OMNIPAQUE IOHEXOL 300 MG/ML  SOLN COMPARISON:  02/13/2022 PET. FINDINGS: CT CHEST FINDINGS Cardiovascular: Bovine arch. Aortic atherosclerosis. Tortuous thoracic aorta. Mild cardiomegaly, without pericardial effusion. No central pulmonary embolism, on this non-dedicated study.  Mediastinum/Nodes: No supraclavicular adenopathy. No mediastinal or hilar adenopathy. Tiny hiatal hernia. Lungs/Pleura: No pleural fluid. Mild centrilobular emphysema. Lower lobe predominant bronchial wall thickening. Scattered subpleural predominant pulmonary nodules are identified on series 3. Example right middle lobe at 2-3 mm on 64/3 and inferior right upper lobe at 2 mm on 66/3. Musculoskeletal: No acute osseous abnormality. Remote bilateral rib fractures. 3rd and fourth anterior left rib fractures are new since 02/13/2022. A moderate T5 compression deformity is grossly similar to the prior PET. CT ABDOMEN PELVIS FINDINGS Hepatobiliary: Normal liver. Normal gallbladder, without biliary ductal dilatation. Pancreas: Normal, without mass or ductal dilatation. Spleen: Subcentimeter hypoattenuating splenic lesions are nonspecific but of doubtful clinical significance. Example at up to 1.2 cm. Likely subtly present and not hypermetabolic on the prior PET. No splenomegaly. Adrenals/Urinary Tract: Mild right adrenal thickening and nodularity are unchanged. Normal left adrenal gland. Interpolar left renal too small to characterize lesion at 9 mm is likely a cyst . In the absence of clinically indicated signs/symptoms require(s) no independent follow-up. No hydronephrosis. Normal urinary bladder. Stomach/Bowel: Proximal gastric underdistention. Normal colon, appendix, and terminal ileum. Normal small bowel. Vascular/Lymphatic: Aortic atherosclerosis. No abdominal adenopathy. Prominent but previously non FDG avid common iliac nodes are again identified. Example left common iliac 10 mm node on 75/2 and right common iliac 7 mm node also on 75/2, both unchanged. Reproductive: Central uterine hypoattenuation including on 100/2 and sagittal image 113. No extra uterine extension. No adnexal mass. Other: No significant free fluid. Mild pelvic floor laxity. No free intraperitoneal air. No evidence of omental or peritoneal  disease. Musculoskeletal: Surgical changes about the symphysis pubis. Lumbosacral spondylosis. Mild superior endplate compression deformity at L1 is similar to the prior PET. IMPRESSION: 1. Central uterine hypoattenuation likely represents residual endometrial primary. 2. Borderline sized common iliac nodes are similar to the prior PET and were not hypermetabolic on that exam. Favored to be reactive. Otherwise, no evidence of metastatic disease in the abdomen or pelvis. 3. Scattered tiny pulmonary nodules are subpleural predominant and favored to represent subpleural lymph nodes. These can be re-evaluated at follow-up. 4.  Tiny hiatal hernia. 5. Interval nonacute anterior left third and fourth rib fractures. 6. Aortic atherosclerosis (ICD10-I70.0) and emphysema (ICD10-J43.9). Electronically Signed   By: KAbigail MiyamotoM.D.   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  Patient seen by GYN-Onc recently who did not think that surgery was a viable option at this point, plus patient is currently refusing surgical intervention.  CT scan results from May 15, 2022 reviewed independently and report as above with residual malignancy.  Enlarged lymph nodes seen on imaging are not hypermetabolic.  Patient agreed to receive 3 cycles of carboplatinum, Taxol, and Keytruda every 3 weeks with Udenyca support followed by repeat PET scan and further evaluation.  Patient may require up to 6 cycles of treatment. Prior to initiating treatment, patient will require port.  Return to clinic on June 20, 2022 to initiate cycle 1.  I spent a total of 60 minutes reviewing chart data, face-to-face evaluation with the patient, counseling and coordination of care as  detailed above.   Patient expressed understanding and was in agreement with this plan. She also understands that She can call clinic at any time with any questions, concerns, or complaints.    Cancer Staging  Endometrial cancer [facility]) Staging form: Corpus Uteri - Carcinoma and Carcinosarcoma, AJCC 8th Edition - Clinical stage from 05/30/2022: FIGO Stage IIIB (cT3b, cN0, cM0) - Signed by Lloyd Huger, MD on 05/30/2022 Stage prefix: Initial diagnosis   Lloyd Huger, MD   05/30/2022 11:51 AM

## 2022-05-30 NOTE — Patient Instructions (Signed)
PA completed for EMLA cream and zofran, decision pending at this time.

## 2022-05-30 NOTE — Progress Notes (Signed)
START OFF PATHWAY REGIMEN - Uterine   [id number]:Carboplatin AUC=5 IV D1 + Paclitaxel 175 mg/m2 IV D1 + Pembrolizumab 200 mg IV D1 q21 Days x 6 Cycles Followed by Pembrolizumab 400 mg IV D1 q42 Days:   Cycles 1 through 6: A cycle is every 21 days:     Pembrolizumab      Paclitaxel      Carboplatin    Cycles 7 and beyond: A cycle is every 42 days:     Pembrolizumab   **Always confirm dose/schedule in your pharmacy ordering system**  Patient Characteristics: Serous Carcinoma, Newly Diagnosed (Clinical Staging), Nonsurgical Candidate, Stage III or IV, HER2 Negative/Unknown, MSS/pMMR Histology: Serous Carcinoma Therapeutic Status: Newly Diagnosed (Clinical Staging) AJCC M Category: cM0 Surgical Candidacy: Nonsurgical Candidate AJCC 8 Stage Grouping: IIIB AJCC T Category: cTX AJCC N Category: cNX HER2 Status: Negative Microsatellite/Mismatch Repair Status: MSS/pMMR Intent of Therapy: Curative Intent, Discussed with Patient

## 2022-05-30 NOTE — Progress Notes (Signed)
Patient here today for initial evaluation in medical oncology regarding endometrial cancer. Patient reports she has not had bleeding in about 3 days. She denies pain today. She does have diarrhea at times. She reports that in the past she has declined chemotherapy but would consider at this time.

## 2022-05-30 NOTE — Patient Instructions (Signed)
IR Port placement appointment request faxed to specialty scheduling.

## 2022-06-01 ENCOUNTER — Other Ambulatory Visit: Payer: Self-pay

## 2022-06-04 ENCOUNTER — Encounter: Payer: Self-pay | Admitting: Radiology

## 2022-06-04 ENCOUNTER — Other Ambulatory Visit: Payer: Self-pay | Admitting: Internal Medicine

## 2022-06-04 DIAGNOSIS — C541 Malignant neoplasm of endometrium: Secondary | ICD-10-CM

## 2022-06-04 NOTE — Progress Notes (Signed)
Patient for IR Port Placement on Tues 06/05/2022, I called and spoke with the patient on the phone and gave pre-procedure instructions. Pt was made aware to be here at 12:30p at the new entrance, NPO after MN prior to procedure as well as driver post procedure/recovery/discharge. Pt stated understanding.  Called 06/04/2022

## 2022-06-04 NOTE — H&P (Incomplete)
Chief Complaint: Chemotherapy access. Request is for chemothrapy access.   Referring Physician(s): Finnegan,Timothy J  Supervising Physician: Juliet Rude  Patient Status: [facility] - Out-pt  History of Present Illness: Terri [name] is a 72 y.o. female female outpatient. History of HTN, HLD, GERD, DM, COPD, endometrial cancer. Team is requesting portacath for chemotherapy access.  Currently without any significant complaints. Patient alert and laying in bed,calm. Denies any fevers, headache, chest pain, SOB, cough, abdominal pain, nausea, vomiting or bleeding. Return precautions and treatment recommendations and follow-up discussed with the patient  who is agreeable with the plan.    Past Medical History:  Diagnosis Date   Adenomatous colon polyp    Aortic stenosis    Arthritis    Asthma    Claudication (HCC)    COPD (chronic obstructive pulmonary disease) ([place])    Depression    Diabetes mellitus without complication (HCC)    Esophageal dysphagia    GERD (gastroesophageal reflux disease)    Hyperlipemia    Hypertension    Obesity    Severe obesity (BMI 35.0-35.9 with comorbidity) ([place])     Past Surgical History:  Procedure Laterality Date   BLADDER SURGERY     COLONOSCOPY WITH PROPOFOL N/A 08/05/2015   Procedure: COLONOSCOPY WITH PROPOFOL;  Surgeon: Josefine Class, MD;  Location: [facility] ENDOSCOPY;  Service: Endoscopy;  Laterality: N/A;   ESOPHAGOGASTRODUODENOSCOPY N/A 01/09/2022   Procedure: ESOPHAGOGASTRODUODENOSCOPY (EGD);  Surgeon: [name], MD;  Location: [facility] ENDOSCOPY;  Service: Endoscopy;  Laterality: N/A;   FOOT SURGERY Left    FRACTURE SURGERY     ORIF ANKLE FRACTURE Left 03/26/2017   Procedure: OPEN REDUCTION INTERNAL FIXATION (ORIF) ANKLE FRACTURE;  Surgeon: Corky Mull, MD;  Location: [facility] ORS;  Service: Orthopedics;  Laterality: Left;    Allergies: Patient has no known allergies.  Medications: Prior to Admission medications    Medication Sig Start Date End Date Taking? Authorizing Provider  albuterol (PROVENTIL HFA;VENTOLIN HFA) 108 (90 Base) MCG/ACT inhaler Inhale 2 puffs into the lungs every 6 (six) hours as needed for wheezing or shortness of breath.    [provider]  amLODipine (NORVASC) 10 MG tablet Take 10 mg by mouth daily.    [provider]  aspirin EC 81 MG tablet Take 81 mg by mouth daily. Patient not taking: Reported on 05/23/2022    [provider]  atenolol (TENORMIN) 25 MG tablet Take 25 mg by mouth daily.    [provider]  atorvastatin (LIPITOR) 80 MG tablet Take 80 mg by mouth daily.    [provider]  buPROPion (WELLBUTRIN XL) 300 MG 24 hr tablet Take 300 mg by mouth daily.    [provider]  cholecalciferol (VITAMIN D) 400 units TABS tablet Take 400 Units by mouth daily.    [provider]  enoxaparin (LOVENOX) 40 MG/0.4ML injection Inject 0.4 mLs (40 mg total) into the skin daily. Patient not taking: Reported on 01/09/2022 03/27/17   Reche Dixon, PA-C  insulin aspart (NOVOLOG) 100 UNIT/ML injection Inject 13 Units into the skin 3 (three) times daily before meals.    [provider]  insulin detemir (LEVEMIR FLEXPEN) 100 UNIT/ML FlexPen Inject 25 units in the morning. Inject 23 units in the evening. 12/14/20   [provider]  insulin glargine (LANTUS) 100 UNIT/ML injection Inject 25 Units into the skin 2 (two) times daily. Patient not taking: Reported on 02/21/2022    [provider]  lidocaine-prilocaine (EMLA) cream Apply to  affected area once 05/30/22   Lloyd Huger, MD  losartan-hydrochlorothiazide (HYZAAR) 100-25 MG tablet Take 1 tablet by mouth daily.    [provider]  metFORMIN (GLUCOPHAGE) 500 MG tablet Take 1,000 mg by mouth 2 (two) times daily with a meal.    [provider]  montelukast (SINGULAIR) 10 MG tablet Take 10 mg by mouth at bedtime.    [provider]   ondansetron (ZOFRAN) 8 MG tablet Take 1 tablet (8 mg total) by mouth every 8 (eight) hours as needed for nausea or vomiting. Start on the third day after chemotherapy. 05/30/22   Lloyd Huger, MD  prochlorperazine (COMPAZINE) 10 MG tablet Take 1 tablet (10 mg total) by mouth every 6 (six) hours as needed for nausea or vomiting. 05/30/22   Lloyd Huger, MD     Family History  Problem Relation Age of Onset   CVA Mother    Diabetes Mother    CAD Father    Breast cancer Neg Hx     Social History   Socioeconomic History   Marital status: Single    Spouse name: Not on file   Number of children: Not on file   Years of education: Not on file   Highest education level: Not on file  Occupational History   Not on file  Tobacco Use   Smoking status: Former    Packs/day: 1.00    Years: 40.00    Total pack years: 40.00    Types: Cigarettes   Smokeless tobacco: Never  Vaping Use   Vaping Use: Never used  Substance and Sexual Activity   Alcohol use: No   Drug use: No   Sexual activity: Not on file  Other Topics Concern   Not on file  Social History Narrative   Not on file   Social Determinants of Health   Financial Resource Strain: Low Risk  (05/30/2022)   Overall Financial Resource Strain (CARDIA)    Difficulty of Paying Living Expenses: Not very hard  Food Insecurity: No Food Insecurity (05/30/2022)   Hunger Vital Sign    Worried About Running Out of Food in the Last Year: Never true    Ran Out of Food in the Last Year: Never true  Transportation Needs: Unmet Transportation Needs (05/30/2022)   PRAPARE - Hydrologist (Medical): Yes    Lack of Transportation (Non-Medical): No  Physical Activity: Not on file  Stress: Not on file  Social Connections: Not on file    Review of Systems: A 12 point ROS discussed and pertinent positives are indicated in the HPI above.  All other systems are negative.  Review of Systems  Constitutional:   Negative for fatigue and fever.  HENT:  Negative for congestion.   Respiratory:  Negative for cough and shortness of breath.   Gastrointestinal:  Negative for abdominal pain, diarrhea, nausea and vomiting.    Vital Signs: BP (!) 224/69   Pulse (!) 58   Temp 98.2 F (36.8 C)   Resp 20   Ht '5\' 2"'$  (1.575 m)   Wt 200 lb (90.7 kg)   SpO2 98%   BMI 36.58 kg/m     Physical Exam Vitals and nursing note reviewed.  Constitutional:      Appearance: She is well-developed.  HENT:     Head: Normocephalic and atraumatic.  Eyes:     Conjunctiva/sclera: Conjunctivae normal.  Cardiovascular:     Rate and Rhythm: Normal rate and regular  rhythm.  Pulmonary:     Effort: Pulmonary effort is normal.  Musculoskeletal:        General: Normal range of motion.     Cervical back: Normal range of motion.  Skin:    General: Skin is warm and dry.  Neurological:     Mental Status: She is alert and oriented to person, place, and time.  Psychiatric:        Mood and Affect: Mood normal.        Behavior: Behavior normal.        Thought Content: Thought content normal.        Judgment: Judgment normal.     Imaging: CT CHEST ABDOMEN PELVIS W CONTRAST  Result Date: 05/15/2022 CLINICAL DATA:  Status post radiation therapy for endometrial cancer. Persistent vaginal bleeding. Restaging. Remote smoker. * Tracking Code: BO * EXAM: CT CHEST, ABDOMEN, AND PELVIS WITH CONTRAST TECHNIQUE: Multidetector CT imaging of the chest, abdomen and pelvis was performed following the standard protocol during bolus administration of intravenous contrast. RADIATION DOSE REDUCTION: This exam was performed according to the departmental dose-optimization program which includes automated exposure control, adjustment of the mA and/or kV according to patient size and/or use of iterative reconstruction technique. CONTRAST:  111m OMNIPAQUE IOHEXOL 300 MG/ML  SOLN COMPARISON:  02/13/2022 PET. FINDINGS: CT CHEST FINDINGS  Cardiovascular: Bovine arch. Aortic atherosclerosis. Tortuous thoracic aorta. Mild cardiomegaly, without pericardial effusion. No central pulmonary embolism, on this non-dedicated study. Mediastinum/Nodes: No supraclavicular adenopathy. No mediastinal or hilar adenopathy. Tiny hiatal hernia. Lungs/Pleura: No pleural fluid. Mild centrilobular emphysema. Lower lobe predominant bronchial wall thickening. Scattered subpleural predominant pulmonary nodules are identified on series 3. Example right middle lobe at 2-3 mm on 64/3 and inferior right upper lobe at 2 mm on 66/3. Musculoskeletal: No acute osseous abnormality. Remote bilateral rib fractures. 3rd and fourth anterior left rib fractures are new since 02/13/2022. A moderate T5 compression deformity is grossly similar to the prior PET. CT ABDOMEN PELVIS FINDINGS Hepatobiliary: Normal liver. Normal gallbladder, without biliary ductal dilatation. Pancreas: Normal, without mass or ductal dilatation. Spleen: Subcentimeter hypoattenuating splenic lesions are nonspecific but of doubtful clinical significance. Example at up to 1.2 cm. Likely subtly present and not hypermetabolic on the prior PET. No splenomegaly. Adrenals/Urinary Tract: Mild right adrenal thickening and nodularity are unchanged. Normal left adrenal gland. Interpolar left renal too small to characterize lesion at 9 mm is likely a cyst . In the absence of clinically indicated signs/symptoms require(s) no independent follow-up. No hydronephrosis. Normal urinary bladder. Stomach/Bowel: Proximal gastric underdistention. Normal colon, appendix, and terminal ileum. Normal small bowel. Vascular/Lymphatic: Aortic atherosclerosis. No abdominal adenopathy. Prominent but previously non FDG avid common iliac nodes are again identified. Example left common iliac 10 mm node on 75/2 and right common iliac 7 mm node also on 75/2, both unchanged. Reproductive: Central uterine hypoattenuation including on 100/2 and sagittal  image 113. No extra uterine extension. No adnexal mass. Other: No significant free fluid. Mild pelvic floor laxity. No free intraperitoneal air. No evidence of omental or peritoneal disease. Musculoskeletal: Surgical changes about the symphysis pubis. Lumbosacral spondylosis. Mild superior endplate compression deformity at L1 is similar to the prior PET. IMPRESSION: 1. Central uterine hypoattenuation likely represents residual endometrial primary. 2. Borderline sized common iliac nodes are similar to the prior PET and were not hypermetabolic on that exam. Favored to be reactive. Otherwise, no evidence of metastatic disease in the abdomen or pelvis. 3. Scattered tiny pulmonary nodules are subpleural predominant and favored to  represent subpleural lymph nodes. These can be re-evaluated at follow-up. 4.  Tiny hiatal hernia. 5. Interval nonacute anterior left third and fourth rib fractures. 6. Aortic atherosclerosis (ICD10-I70.0) and emphysema (ICD10-J43.9). Electronically Signed   By: Abigail Miyamoto M.D.   On: 05/15/2022 12:05    Labs:  CBC: Recent Labs    03/15/22 1101 04/12/22 1044 04/16/22 1104  WBC 8.5 5.1 5.0  HGB 12.7 11.1* 11.4*  HCT 40.0 35.1* 35.6*  PLT 231 163 198    COAGS: No results for input(s): "INR", "APTT" in the last 8760 hours.  BMP: Recent Labs    05/15/22 1121  CREATININE 0.90    Assessment and Plan:  72 y.o. female outpatient. History of HTN, HLD, GERD, DM, COPD, endometrial cancer. Team is requesting portacath for chemotherapy access.   CT CAP  from 1.16.24 shows RIJ is accessible. No recent labs. All medications are within acceptable parameters. NKDA. Patient has been NPO since midnight.  Risks and benefits of image guided port-a-catheter placement was discussed with the patient including, but not limited to bleeding, infection, pneumothorax, or fibrin sheath development and need for additional procedures.  All of the patient's questions were answered, patient is  agreeable to proceed. Consent signed and in chart.   Thank you for this interesting consult.  I greatly enjoyed meeting Terri [name] and look forward to participating in their care.  A copy of this report was sent to the requesting provider on this date.  Electronically Signed: Jacqualine Mau, NP 06/05/2022, 1:38 PM   I spent a total of  30 Minutes   in face to face in clinical consultation, greater than 50% of which was counseling/coordinating care for portacath placement

## 2022-06-05 ENCOUNTER — Ambulatory Visit
Admission: RE | Admit: 2022-06-05 | Discharge: 2022-06-05 | Disposition: A | Discharge status: Home / Self Care | Payer: PPO | Source: Ambulatory Visit | Attending: Oncology | Admitting: Oncology

## 2022-06-05 DIAGNOSIS — I119 Hypertensive heart disease without heart failure: Secondary | ICD-10-CM | POA: Insufficient documentation

## 2022-06-05 DIAGNOSIS — E785 Hyperlipidemia, unspecified: Secondary | ICD-10-CM | POA: Insufficient documentation

## 2022-06-05 DIAGNOSIS — C541 Malignant neoplasm of endometrium: Secondary | ICD-10-CM | POA: Insufficient documentation

## 2022-06-05 DIAGNOSIS — Z8249 Family history of ischemic heart disease and other diseases of the circulatory system: Secondary | ICD-10-CM | POA: Insufficient documentation

## 2022-06-05 DIAGNOSIS — E119 Type 2 diabetes mellitus without complications: Secondary | ICD-10-CM | POA: Insufficient documentation

## 2022-06-05 DIAGNOSIS — Z794 Long term (current) use of insulin: Secondary | ICD-10-CM | POA: Insufficient documentation

## 2022-06-05 DIAGNOSIS — Z833 Family history of diabetes mellitus: Secondary | ICD-10-CM | POA: Diagnosis not present

## 2022-06-05 DIAGNOSIS — Z87891 Personal history of nicotine dependence: Secondary | ICD-10-CM | POA: Diagnosis not present

## 2022-06-05 DIAGNOSIS — Z7984 Long term (current) use of oral hypoglycemic drugs: Secondary | ICD-10-CM | POA: Insufficient documentation

## 2022-06-05 DIAGNOSIS — Z452 Encounter for adjustment and management of vascular access device: Secondary | ICD-10-CM | POA: Diagnosis not present

## 2022-06-05 DIAGNOSIS — K219 Gastro-esophageal reflux disease without esophagitis: Secondary | ICD-10-CM | POA: Insufficient documentation

## 2022-06-05 DIAGNOSIS — J449 Chronic obstructive pulmonary disease, unspecified: Secondary | ICD-10-CM | POA: Diagnosis not present

## 2022-06-05 HISTORY — PX: IR IMAGING GUIDED PORT INSERTION: IMG5740

## 2022-06-05 MED ORDER — FENTANYL CITRATE (PF) 100 MCG/2ML IJ SOLN
INTRAMUSCULAR | Status: AC | PRN
  Administered 2022-06-05 (×2): 50 ug via INTRAVENOUS

## 2022-06-05 MED ORDER — LIDOCAINE-EPINEPHRINE 1 %-1:100000 IJ SOLN
INTRAMUSCULAR | Status: AC
  Administered 2022-06-05: 16 mL
  Filled 2022-06-05: qty 1

## 2022-06-05 MED ORDER — MIDAZOLAM HCL 2 MG/2ML IJ SOLN
INTRAMUSCULAR | Status: AC | PRN
  Administered 2022-06-05: 1 mg via INTRAVENOUS

## 2022-06-05 MED ORDER — SODIUM CHLORIDE 0.9 % IV SOLN: INTRAVENOUS | Status: DC

## 2022-06-05 MED ORDER — MIDAZOLAM HCL 2 MG/2ML IJ SOLN
INTRAMUSCULAR | Status: AC
  Filled 2022-06-05: qty 2

## 2022-06-05 MED ORDER — MIDAZOLAM HCL 5 MG/5ML IJ SOLN
INTRAMUSCULAR | Status: AC | PRN
  Administered 2022-06-05 (×2): .5 mg via INTRAVENOUS

## 2022-06-05 MED ORDER — FENTANYL CITRATE (PF) 100 MCG/2ML IJ SOLN
INTRAMUSCULAR | Status: AC
  Filled 2022-06-05: qty 2

## 2022-06-05 MED ORDER — HEPARIN SOD (PORK) LOCK FLUSH 100 UNIT/ML IV SOLN
INTRAVENOUS | Status: AC
  Administered 2022-06-05: 500 [IU]
  Filled 2022-06-05: qty 5

## 2022-06-05 NOTE — Procedures (Signed)
Interventional Radiology Procedure Note  Date of Procedure: 06/05/2022  Procedure: Port placement   Findings:  1. IR port placement right chest via right IJ    Complications: No immediate complications noted.   Estimated Blood Loss: minimal  Follow-up and Recommendations: 1. Ready for use    Albin Felling, MD  Vascular & Interventional Radiology  06/05/2022 2:41 PM

## 2022-06-07 ENCOUNTER — Other Ambulatory Visit: Payer: Self-pay

## 2022-06-07 LAB — GLUCOSE, CAPILLARY: Glucose-Capillary: 119 mg/dL — ABNORMAL HIGH (ref 70–99)

## 2022-06-12 NOTE — Progress Notes (Signed)
Pharmacist Chemotherapy Monitoring - Initial Assessment    Anticipated start date: 06/20/22   The following has been reviewed per standard work regarding the patient's treatment regimen: The patient's diagnosis, treatment plan and drug doses, and organ/hematologic function Lab orders and baseline tests specific to treatment regimen  The treatment plan start date, drug sequencing, and pre-medications Prior authorization status  Patient's documented medication list, including drug-drug interaction screen and prescriptions for anti-emetics and supportive care specific to the treatment regimen The drug concentrations, fluid compatibility, administration routes, and timing of the medications to be used The patient's access for treatment and lifetime cumulative dose history, if applicable  The patient's medication allergies and previous infusion related reactions, if applicable   Changes made to treatment plan:  N/A  Follow up needed:  N/A   Judge Stall, [place], 06/12/2022  10:56 AM

## 2022-06-13 ENCOUNTER — Other Ambulatory Visit: Payer: Self-pay

## 2022-06-14 DIAGNOSIS — E1122 Type 2 diabetes mellitus with diabetic chronic kidney disease: Secondary | ICD-10-CM | POA: Diagnosis not present

## 2022-06-14 DIAGNOSIS — Z79899 Other long term (current) drug therapy: Secondary | ICD-10-CM | POA: Diagnosis not present

## 2022-06-14 DIAGNOSIS — J4489 Other specified chronic obstructive pulmonary disease: Secondary | ICD-10-CM | POA: Diagnosis not present

## 2022-06-14 DIAGNOSIS — E118 Type 2 diabetes mellitus with unspecified complications: Secondary | ICD-10-CM | POA: Diagnosis not present

## 2022-06-14 DIAGNOSIS — J439 Emphysema, unspecified: Secondary | ICD-10-CM | POA: Diagnosis not present

## 2022-06-14 DIAGNOSIS — Z794 Long term (current) use of insulin: Secondary | ICD-10-CM | POA: Diagnosis not present

## 2022-06-14 DIAGNOSIS — Z0000 Encounter for general adult medical examination without abnormal findings: Secondary | ICD-10-CM | POA: Diagnosis not present

## 2022-06-14 DIAGNOSIS — Z1231 Encounter for screening mammogram for malignant neoplasm of breast: Secondary | ICD-10-CM | POA: Diagnosis not present

## 2022-06-14 DIAGNOSIS — I10 Essential (primary) hypertension: Secondary | ICD-10-CM | POA: Diagnosis not present

## 2022-06-14 DIAGNOSIS — Z1211 Encounter for screening for malignant neoplasm of colon: Secondary | ICD-10-CM | POA: Diagnosis not present

## 2022-06-14 DIAGNOSIS — C579 Malignant neoplasm of female genital organ, unspecified: Secondary | ICD-10-CM | POA: Diagnosis not present

## 2022-06-14 DIAGNOSIS — E782 Mixed hyperlipidemia: Secondary | ICD-10-CM | POA: Diagnosis not present

## 2022-06-15 ENCOUNTER — Other Ambulatory Visit: Payer: Self-pay | Admitting: Internal Medicine

## 2022-06-15 DIAGNOSIS — Z1231 Encounter for screening mammogram for malignant neoplasm of breast: Secondary | ICD-10-CM

## 2022-06-18 ENCOUNTER — Inpatient Hospital Stay: Payer: PPO | Attending: Obstetrics and Gynecology

## 2022-06-18 ENCOUNTER — Inpatient Hospital Stay: Payer: PPO

## 2022-06-18 DIAGNOSIS — I10 Essential (primary) hypertension: Secondary | ICD-10-CM | POA: Insufficient documentation

## 2022-06-18 DIAGNOSIS — Z79620 Long term (current) use of immunosuppressive biologic: Secondary | ICD-10-CM | POA: Insufficient documentation

## 2022-06-18 DIAGNOSIS — Z5111 Encounter for antineoplastic chemotherapy: Secondary | ICD-10-CM | POA: Insufficient documentation

## 2022-06-18 DIAGNOSIS — C7982 Secondary malignant neoplasm of genital organs: Secondary | ICD-10-CM | POA: Insufficient documentation

## 2022-06-18 DIAGNOSIS — D649 Anemia, unspecified: Secondary | ICD-10-CM | POA: Insufficient documentation

## 2022-06-18 DIAGNOSIS — C541 Malignant neoplasm of endometrium: Secondary | ICD-10-CM | POA: Insufficient documentation

## 2022-06-18 DIAGNOSIS — Z5112 Encounter for antineoplastic immunotherapy: Secondary | ICD-10-CM | POA: Insufficient documentation

## 2022-06-18 DIAGNOSIS — Z5189 Encounter for other specified aftercare: Secondary | ICD-10-CM | POA: Insufficient documentation

## 2022-06-19 MED FILL — Fosaprepitant Dimeglumine For IV Infusion 150 MG (Base Eq): INTRAVENOUS | Qty: 5 | Status: AC

## 2022-06-19 MED FILL — Dexamethasone Sodium Phosphate Inj 100 MG/10ML: INTRAMUSCULAR | Qty: 1 | Status: AC

## 2022-06-20 ENCOUNTER — Inpatient Hospital Stay: Payer: PPO

## 2022-06-20 ENCOUNTER — Encounter: Payer: Self-pay | Admitting: Oncology

## 2022-06-20 ENCOUNTER — Inpatient Hospital Stay (HOSPITAL_BASED_OUTPATIENT_CLINIC_OR_DEPARTMENT_OTHER): Payer: PPO | Admitting: Oncology

## 2022-06-20 VITALS — BP 177/86 | HR 65 | Resp 16

## 2022-06-20 VITALS — BP 166/47 | HR 50 | Temp 97.9°F | Resp 20 | Ht 62.0 in | Wt 203.0 lb

## 2022-06-20 DIAGNOSIS — Z79620 Long term (current) use of immunosuppressive biologic: Secondary | ICD-10-CM | POA: Diagnosis not present

## 2022-06-20 DIAGNOSIS — Z95828 Presence of other vascular implants and grafts: Secondary | ICD-10-CM

## 2022-06-20 DIAGNOSIS — C541 Malignant neoplasm of endometrium: Secondary | ICD-10-CM

## 2022-06-20 DIAGNOSIS — I10 Essential (primary) hypertension: Secondary | ICD-10-CM | POA: Diagnosis not present

## 2022-06-20 DIAGNOSIS — Z5111 Encounter for antineoplastic chemotherapy: Secondary | ICD-10-CM | POA: Diagnosis not present

## 2022-06-20 DIAGNOSIS — C7982 Secondary malignant neoplasm of genital organs: Secondary | ICD-10-CM | POA: Diagnosis not present

## 2022-06-20 DIAGNOSIS — Z5189 Encounter for other specified aftercare: Secondary | ICD-10-CM | POA: Diagnosis not present

## 2022-06-20 DIAGNOSIS — Z5112 Encounter for antineoplastic immunotherapy: Secondary | ICD-10-CM | POA: Diagnosis not present

## 2022-06-20 DIAGNOSIS — D649 Anemia, unspecified: Secondary | ICD-10-CM | POA: Diagnosis not present

## 2022-06-20 LAB — COMPREHENSIVE METABOLIC PANEL
ALT: 16 U/L (ref 0–44)
AST: 17 U/L (ref 15–41)
Albumin: 3.5 g/dL (ref 3.5–5.0)
Alkaline Phosphatase: 88 U/L (ref 38–126)
Anion gap: 8 (ref 5–15)
BUN: 19 mg/dL (ref 8–23)
CO2: 30 mmol/L (ref 22–32)
Calcium: 8.8 mg/dL — ABNORMAL LOW (ref 8.9–10.3)
Chloride: 104 mmol/L (ref 98–111)
Creatinine, Ser: 0.91 mg/dL (ref 0.44–1.00)
GFR, Estimated: >60 mL/min (ref >60)
Glucose, Bld: 132 mg/dL — ABNORMAL HIGH (ref 70–99)
Potassium: 3.7 mmol/L (ref 3.5–5.1)
Sodium: 142 mmol/L (ref 135–145)
Total Bilirubin: 0.5 mg/dL (ref 0.3–1.2)
Total Protein: 6.5 g/dL (ref 6.5–8.1)

## 2022-06-20 LAB — CBC WITH DIFFERENTIAL/PLATELET
Abs Immature Granulocytes: 0.02 10*3/uL (ref 0.00–0.07)
Basophils Absolute: 0 10*3/uL (ref 0.0–0.1)
Basophils Relative: 0 %
Eosinophils Absolute: 0.2 10*3/uL (ref 0.0–0.5)
Eosinophils Relative: 3 %
HCT: 37.8 % (ref 36.0–46.0)
Hemoglobin: 11.7 g/dL — ABNORMAL LOW (ref 12.0–15.0)
Immature Granulocytes: 0 %
Lymphocytes Relative: 6 %
Lymphs Abs: 0.3 10*3/uL — ABNORMAL LOW (ref 0.7–4.0)
MCH: 26.3 pg (ref 26.0–34.0)
MCHC: 31 g/dL (ref 30.0–36.0)
MCV: 84.9 fL (ref 80.0–100.0)
Monocytes Absolute: 0.5 10*3/uL (ref 0.1–1.0)
Monocytes Relative: 10 %
Neutro Abs: 4.2 10*3/uL (ref 1.7–7.7)
Neutrophils Relative %: 81 %
Platelets: 181 10*3/uL (ref 150–400)
RBC: 4.45 MIL/uL (ref 3.87–5.11)
RDW: 14.4 % (ref 11.5–15.5)
WBC: 5.2 10*3/uL (ref 4.0–10.5)
nRBC: 0 % (ref 0.0–0.2)

## 2022-06-20 LAB — TSH: TSH: 2.161 u[IU]/mL (ref 0.350–4.500)

## 2022-06-20 MED ORDER — SODIUM CHLORIDE 0.9 % IV SOLN
175.0000 mg/m2 | Freq: Once | INTRAVENOUS | Status: AC
  Administered 2022-06-20: 348 mg via INTRAVENOUS
  Filled 2022-06-20: qty 58

## 2022-06-20 MED ORDER — SODIUM CHLORIDE 0.9 % IV SOLN
Freq: Once | INTRAVENOUS | Status: AC
  Filled 2022-06-20: qty 250

## 2022-06-20 MED ORDER — PALONOSETRON HCL INJECTION 0.25 MG/5ML
0.2500 mg | Freq: Once | INTRAVENOUS | Status: AC
  Administered 2022-06-20: 0.25 mg via INTRAVENOUS
  Filled 2022-06-20: qty 5

## 2022-06-20 MED ORDER — DIPHENHYDRAMINE HCL 50 MG/ML IJ SOLN
25.0000 mg | Freq: Once | INTRAMUSCULAR | Status: AC
  Administered 2022-06-20: 25 mg via INTRAVENOUS
  Filled 2022-06-20: qty 1

## 2022-06-20 MED ORDER — SODIUM CHLORIDE 0.9% FLUSH
10.0000 mL | Freq: Once | INTRAVENOUS | Status: AC
  Administered 2022-06-20: 10 mL via INTRAVENOUS
  Filled 2022-06-20: qty 10

## 2022-06-20 MED ORDER — SODIUM CHLORIDE 0.9 % IV SOLN
492.0000 mg | Freq: Once | INTRAVENOUS | Status: AC
  Administered 2022-06-20: 500 mg via INTRAVENOUS
  Filled 2022-06-20: qty 50

## 2022-06-20 MED ORDER — SODIUM CHLORIDE 0.9 % IV SOLN
200.0000 mg | Freq: Once | INTRAVENOUS | Status: AC
  Administered 2022-06-20: 200 mg via INTRAVENOUS
  Filled 2022-06-20: qty 8

## 2022-06-20 MED ORDER — SODIUM CHLORIDE 0.9 % IV SOLN
150.0000 mg | Freq: Once | INTRAVENOUS | Status: AC
  Administered 2022-06-20: 150 mg via INTRAVENOUS
  Filled 2022-06-20: qty 150

## 2022-06-20 MED ORDER — SODIUM CHLORIDE 0.9 % IV SOLN
10.0000 mg | Freq: Once | INTRAVENOUS | Status: AC
  Administered 2022-06-20: 10 mg via INTRAVENOUS
  Filled 2022-06-20: qty 10

## 2022-06-20 MED ORDER — FAMOTIDINE IN NACL 20-0.9 MG/50ML-% IV SOLN
20.0000 mg | Freq: Once | INTRAVENOUS | Status: AC
  Administered 2022-06-20: 20 mg via INTRAVENOUS
  Filled 2022-06-20: qty 50

## 2022-06-20 NOTE — Progress Notes (Signed)
RN attempted port labs this am, unsuccessful. Kept hitting metal, seems port is flipped. Agricultural consultant and other RN attempted, no success. Patient is on the way down to get peripheral labs. MD notifed.

## 2022-06-20 NOTE — Progress Notes (Signed)
At 1332: during the last titration for first time Taxol, per protocol, patient BP increased to 178/68, Taxol stopped and MD aware.  Per MD ok to proceed with Taxol treatment and have an APP come assess patient. Also made Dr. Grayland Ormond aware that patient was sensitive to the 25 mg IV benadryl and that PO Benadryl may be better for future treatments since she became very drowsy after.   At 1349- BP increased to 184/56. Roxy Manns, NP at chairside and came to assess patient and ordered for Taxol to be paused.     At 1400, Beckey Rutter NP came to assess patient and agreed to continue Taxol. Per Lauren NP, will contact patient's PCP to adjust BP medications and to stop Taxol if systolic BP gets over [phone].

## 2022-06-20 NOTE — Patient Instructions (Addendum)
Terri [name]- Dr. Gary Fleet nurse [phone]    [facility]  Discharge Instructions: Thank you for choosing [facility] to provide your oncology and hematology care.  If you have a lab appointment with the [facility], please go directly to the [facility] and check in at the registration area.  Wear comfortable clothing and clothing appropriate for easy access to any Portacath or PICC line.   We strive to give you quality time with your provider. You may need to reschedule your appointment if you arrive late (15 or more minutes).  Arriving late affects you and other patients whose appointments are after yours.  Also, if you miss three or more appointments without notifying the office, you may be dismissed from the clinic at the provider's discretion.      For prescription refill requests, have your pharmacy contact our office and allow 72 hours for refills to be completed.    Today you received the following chemotherapy and/or immunotherapy agents Taxol, Carboplatin, Keytruda      To help prevent nausea and vomiting after your treatment, we encourage you to take your nausea medication as directed.  BELOW ARE SYMPTOMS THAT SHOULD BE REPORTED IMMEDIATELY: *FEVER GREATER THAN 100.4 F (38 C) OR HIGHER *CHILLS OR SWEATING *NAUSEA AND VOMITING THAT IS NOT CONTROLLED WITH YOUR NAUSEA MEDICATION *UNUSUAL SHORTNESS OF BREATH *UNUSUAL BRUISING OR BLEEDING *URINARY PROBLEMS (pain or burning when urinating, or frequent urination) *BOWEL PROBLEMS (unusual diarrhea, constipation, pain near the anus) TENDERNESS IN MOUTH AND THROAT WITH OR WITHOUT PRESENCE OF ULCERS (sore throat, sores in mouth, or a toothache) UNUSUAL RASH, SWELLING OR PAIN  UNUSUAL VAGINAL DISCHARGE OR ITCHING   Items with * indicate a potential emergency and should be followed up as soon as possible or go to the Emergency Department if any problems should occur.  Please show  the CHEMOTHERAPY ALERT CARD or IMMUNOTHERAPY ALERT CARD at check-in to the Emergency Department and triage nurse.  Should you have questions after your visit or need to cancel or reschedule your appointment, please contact [facility]  [phone] and follow the prompts.  Office hours are 8:00 a.m. to 4:30 p.m. Monday - Friday. Please note that voicemails left after 4:00 p.m. may not be returned until the following business day.  We are closed weekends and major holidays. You have access to a nurse at all times for urgent questions. Please call the main number to the clinic [phone] and follow the prompts.  For any non-urgent questions, you may also contact your provider using MyChart. We now offer e-Visits for anyone 60 and older to request care online for non-urgent symptoms. For details visit [email or web address].   Also download the MyChart app! Go to the app store, search "MyChart", open the app, select [facility], and log in with your MyChart username and password.   Paclitaxel Injection What is this medication? PACLITAXEL (PAK li TAX el) treats some types of cancer. It works by slowing down the growth of cancer cells. This medicine may be used for other purposes; ask your health care provider or pharmacist if you have questions. COMMON BRAND NAME(S): Onxol, Taxol What should I tell my care team before I take this medication? They need to know if you have any of these conditions: Heart disease Liver disease Low white blood cell levels An unusual or allergic reaction to paclitaxel, other medications, foods, dyes, or preservatives If you or your partner are pregnant  or trying to get pregnant Breast-feeding How should I use this medication? This medication is injected into a vein. It is given by your care team in a hospital or clinic setting. Talk to your care team about the use of this medication in children. While it may be given to children for  selected conditions, precautions do apply. Overdosage: If you think you have taken too much of this medicine contact a poison control center or emergency room at once. NOTE: This medicine is only for you. Do not share this medicine with others. What if I miss a dose? Keep appointments for follow-up doses. It is important not to miss your dose. Call your care team if you are unable to keep an appointment. What may interact with this medication? Do not take this medication with any of the following: Live virus vaccines Other medications may affect the way this medication works. Talk with your care team about all of the medications you take. They may suggest changes to your treatment plan to lower the risk of side effects and to make sure your medications work as intended. This list may not describe all possible interactions. Give your health care provider a list of all the medicines, herbs, non-prescription drugs, or dietary supplements you use. Also tell them if you smoke, drink alcohol, or use illegal drugs. Some items may interact with your medicine. What should I watch for while using this medication? Your condition will be monitored carefully while you are receiving this medication. You may need blood work while taking this medication. This medication may make you feel generally unwell. This is not uncommon as chemotherapy can affect healthy cells as well as cancer cells. Report any side effects. Continue your course of treatment even though you feel ill unless your care team tells you to stop. This medication can cause serious allergic reactions. To reduce the risk, your care team may give you other medications to take before receiving this one. Be sure to follow the directions from your care team. This medication may increase your risk of getting an infection. Call your care team for advice if you get a fever, chills, sore throat, or other symptoms of a cold or flu. Do not treat yourself. Try to  avoid being around people who are sick. This medication may increase your risk to bruise or bleed. Call your care team if you notice any unusual bleeding. Be careful brushing or flossing your teeth or using a toothpick because you may get an infection or bleed more easily. If you have any dental work done, tell your dentist you are receiving this medication. Talk to your care team if you may be pregnant. Serious birth defects can occur if you take this medication during pregnancy. Talk to your care team before breastfeeding. Changes to your treatment plan may be needed. What side effects may I notice from receiving this medication? Side effects that you should report to your care team as soon as possible: Allergic reactions--skin rash, itching, hives, swelling of the face, lips, tongue, or throat Heart rhythm changes--fast or irregular heartbeat, dizziness, feeling faint or lightheaded, chest pain, trouble breathing Increase in blood pressure Infection--fever, chills, cough, sore throat, wounds that don't heal, pain or trouble when passing urine, general feeling of discomfort or being unwell Low blood pressure--dizziness, feeling faint or lightheaded, blurry vision Low red blood cell level--unusual weakness or fatigue, dizziness, headache, trouble breathing Painful swelling, warmth, or redness of the skin, blisters or sores at the infusion site Pain,  tingling, or numbness in the hands or feet Slow heartbeat--dizziness, feeling faint or lightheaded, confusion, trouble breathing, unusual weakness or fatigue Unusual bruising or bleeding Side effects that usually do not require medical attention (report to your care team if they continue or are bothersome): Diarrhea Hair loss Joint pain Loss of appetite Muscle pain Nausea Vomiting This list may not describe all possible side effects. Call your doctor for medical advice about side effects. You may report side effects to FDA at  1-800-FDA-1088. Where should I keep my medication? This medication is given in a hospital or clinic. It will not be stored at home. NOTE: This sheet is a summary. It may not cover all possible information. If you have questions about this medicine, talk to your doctor, pharmacist, or health care provider.  2023 Elsevier/Gold Standard (2021-08-16 00:00:00)  Carboplatin Injection What is this medication? CARBOPLATIN (KAR boe pla tin) treats some types of cancer. It works by slowing down the growth of cancer cells. This medicine may be used for other purposes; ask your health care provider or pharmacist if you have questions. COMMON BRAND NAME(S): Paraplatin What should I tell my care team before I take this medication? They need to know if you have any of these conditions: Blood disorders Hearing problems Kidney disease Recent or ongoing radiation therapy An unusual or allergic reaction to carboplatin, cisplatin, other medications, foods, dyes, or preservatives Pregnant or trying to get pregnant Breast-feeding How should I use this medication? This medication is injected into a vein. It is given by your care team in a hospital or clinic setting. Talk to your care team about the use of this medication in children. Special care may be needed. Overdosage: If you think you have taken too much of this medicine contact a poison control center or emergency room at once. NOTE: This medicine is only for you. Do not share this medicine with others. What if I miss a dose? Keep appointments for follow-up doses. It is important not to miss your dose. Call your care team if you are unable to keep an appointment. What may interact with this medication? Medications for seizures Some antibiotics, such as amikacin, gentamicin, neomycin, streptomycin, tobramycin Vaccines This list may not describe all possible interactions. Give your health care provider a list of all the medicines, herbs,  non-prescription drugs, or dietary supplements you use. Also tell them if you smoke, drink alcohol, or use illegal drugs. Some items may interact with your medicine. What should I watch for while using this medication? Your condition will be monitored carefully while you are receiving this medication. You may need blood work while taking this medication. This medication may make you feel generally unwell. This is not uncommon, as chemotherapy can affect healthy cells as well as cancer cells. Report any side effects. Continue your course of treatment even though you feel ill unless your care team tells you to stop. In some cases, you may be given additional medications to help with side effects. Follow all directions for their use. This medication may increase your risk of getting an infection. Call your care team for advice if you get a fever, chills, sore throat, or other symptoms of a cold or flu. Do not treat yourself. Try to avoid being around people who are sick. Avoid taking medications that contain aspirin, acetaminophen, ibuprofen, naproxen, or ketoprofen unless instructed by your care team. These medications may hide a fever. Be careful brushing or flossing your teeth or using a toothpick because you  may get an infection or bleed more easily. If you have any dental work done, tell your dentist you are receiving this medication. Talk to your care team if you wish to become pregnant or think you might be pregnant. This medication can cause serious birth defects. Talk to your care team about effective forms of contraception. Do not breast-feed while taking this medication. What side effects may I notice from receiving this medication? Side effects that you should report to your care team as soon as possible: Allergic reactions--skin rash, itching, hives, swelling of the face, lips, tongue, or throat Infection--fever, chills, cough, sore throat, wounds that don't heal, pain or trouble when passing  urine, general feeling of discomfort or being unwell Low red blood cell level--unusual weakness or fatigue, dizziness, headache, trouble breathing Pain, tingling, or numbness in the hands or feet, muscle weakness, change in vision, confusion or trouble speaking, loss of balance or coordination, trouble walking, seizures Unusual bruising or bleeding Side effects that usually do not require medical attention (report to your care team if they continue or are bothersome): Hair loss Nausea Unusual weakness or fatigue Vomiting This list may not describe all possible side effects. Call your doctor for medical advice about side effects. You may report side effects to FDA at 1-800-FDA-1088. Where should I keep my medication? This medication is given in a hospital or clinic. It will not be stored at home. NOTE: This sheet is a summary. It may not cover all possible information. If you have questions about this medicine, talk to your doctor, pharmacist, or health care provider.  2023 Elsevier/Gold Standard (2021-07-31 00:00:00)   Pembrolizumab Injection What is this medication? PEMBROLIZUMAB (PEM broe LIZ ue mab) treats some types of cancer. It works by helping your immune system slow or stop the spread of cancer cells. It is a monoclonal antibody. This medicine may be used for other purposes; ask your health care provider or pharmacist if you have questions. COMMON BRAND NAME(S): Keytruda What should I tell my care team before I take this medication? They need to know if you have any of these conditions: Allogeneic stem cell transplant (uses someone else's stem cells) Autoimmune diseases, such as Crohn disease, ulcerative colitis, lupus History of chest radiation Nervous system problems, such as Guillain-Barre syndrome, myasthenia gravis Organ transplant An unusual or allergic reaction to pembrolizumab, other medications, foods, dyes, or preservatives Pregnant or trying to get  pregnant Breast-feeding How should I use this medication? This medication is injected into a vein. It is given by your care team in a hospital or clinic setting. A special MedGuide will be given to you before each treatment. Be sure to read this information carefully each time. Talk to your care team about the use of this medication in children. While it may be prescribed for children as young as 6 months for selected conditions, precautions do apply. Overdosage: If you think you have taken too much of this medicine contact a poison control center or emergency room at once. NOTE: This medicine is only for you. Do not share this medicine with others. What if I miss a dose? Keep appointments for follow-up doses. It is important not to miss your dose. Call your care team if you are unable to keep an appointment. What may interact with this medication? Interactions have not been studied. This list may not describe all possible interactions. Give your health care provider a list of all the medicines, herbs, non-prescription drugs, or dietary supplements you use. Also  tell them if you smoke, drink alcohol, or use illegal drugs. Some items may interact with your medicine. What should I watch for while using this medication? Your condition will be monitored carefully while you are receiving this medication. You may need blood work while taking this medication. This medication may cause serious skin reactions. They can happen weeks to months after starting the medication. Contact your care team right away if you notice fevers or flu-like symptoms with a rash. The rash may be red or purple and then turn into blisters or peeling of the skin. You may also notice a red rash with swelling of the face, lips, or lymph nodes in your neck or under your arms. Tell your care team right away if you have any change in your eyesight. Talk to your care team if you may be pregnant. Serious birth defects can occur if you  take this medication during pregnancy and for 4 months after the last dose. You will need a negative pregnancy test before starting this medication. Contraception is recommended while taking this medication and for 4 months after the last dose. Your care team can help you find the option that works for you. Do not breastfeed while taking this medication and for 4 months after the last dose. What side effects may I notice from receiving this medication? Side effects that you should report to your care team as soon as possible: Allergic reactions--skin rash, itching, hives, swelling of the face, lips, tongue, or throat Dry cough, shortness of breath or trouble breathing Eye pain, redness, irritation, or discharge with blurry or decreased vision Heart muscle inflammation--unusual weakness or fatigue, shortness of breath, chest pain, fast or irregular heartbeat, dizziness, swelling of the ankles, feet, or hands Hormone gland problems--headache, sensitivity to light, unusual weakness or fatigue, dizziness, fast or irregular heartbeat, increased sensitivity to cold or heat, excessive sweating, constipation, hair loss, increased thirst or amount of urine, tremors or shaking, irritability Infusion reactions--chest pain, shortness of breath or trouble breathing, feeling faint or lightheaded Kidney injury (glomerulonephritis)--decrease in the amount of urine, red or dark Dhillon urine, foamy or bubbly urine, swelling of the ankles, hands, or feet Liver injury--right upper belly pain, loss of appetite, nausea, light-colored stool, dark yellow or Norem urine, yellowing skin or eyes, unusual weakness or fatigue Pain, tingling, or numbness in the hands or feet, muscle weakness, change in vision, confusion or trouble speaking, loss of balance or coordination, trouble walking, seizures Rash, fever, and swollen lymph nodes Redness, blistering, peeling, or loosening of the skin, including inside the mouth Sudden or  severe stomach pain, bloody diarrhea, fever, nausea, vomiting Side effects that usually do not require medical attention (report to your care team if they continue or are bothersome): Bone, joint, or muscle pain Diarrhea Fatigue Loss of appetite Nausea Skin rash This list may not describe all possible side effects. Call your doctor for medical advice about side effects. You may report side effects to FDA at 1-800-FDA-1088. Where should I keep my medication? This medication is given in a hospital or clinic. It will not be stored at home. NOTE: This sheet is a summary. It may not cover all possible information. If you have questions about this medicine, talk to your doctor, pharmacist, or health care provider.  2023 Elsevier/Gold Standard (2013-01-05 00:00:00)

## 2022-06-20 NOTE — Progress Notes (Signed)
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  INTERVAL HISTORY: Patient returns to clinic today for further evaluation and consideration of cycle 1 of carboplatinum, Taxol, and Keytruda.  She currently feels well and is asymptomatic.  Nursing had difficult time accessing her port and patient will require dye study to assess correct placement. She currently feels well and is asymptomatic.  She has no neurologic complaints.  She denies any recent fevers or illnesses.  She has a good appetite and denies weight loss.  She has no chest pain, shortness of breath, cough, or hemoptysis.  She denies any nausea, vomiting, constipation, or diarrhea.  She has no urinary complaints.  Patient offers no further specific complaints today.  REVIEW OF SYSTEMS:   Review of Systems  Constitutional: Negative.  Negative for fever, malaise/fatigue and weight loss.  Respiratory: Negative.  Negative for cough, hemoptysis and shortness of breath.   Cardiovascular: Negative.  Negative for chest pain and leg swelling.  Gastrointestinal: Negative.  Negative for abdominal pain, blood in stool and melena.  Genitourinary: Negative.  Negative for dysuria.  Musculoskeletal: Negative.  Negative for back pain.  Skin: Negative.  Negative for rash.  Neurological: Negative.  Negative for dizziness, focal weakness, weakness and headaches.  Psychiatric/Behavioral: Negative.  The patient is not nervous/anxious.     As per HPI. Otherwise, a complete review of systems is negative.  PAST MEDICAL HISTORY: Past Medical History:  Diagnosis Date   Adenomatous colon polyp    Aortic stenosis    Arthritis    Asthma     Claudication (HCC)    COPD (chronic obstructive pulmonary disease) ([place])    Depression    Diabetes mellitus without complication (HCC)    Esophageal dysphagia    GERD (gastroesophageal reflux disease)    Hyperlipemia    Hypertension    Obesity    Severe obesity (BMI 35.0-35.9 with comorbidity) ([place])     PAST SURGICAL HISTORY: Past Surgical History:  Procedure Laterality Date   BLADDER SURGERY     COLONOSCOPY WITH PROPOFOL N/A 08/05/2015   Procedure: COLONOSCOPY WITH PROPOFOL;  Surgeon: Josefine Class, MD;  Location: [facility] ENDOSCOPY;  Service: Endoscopy;  Laterality: N/A;   ESOPHAGOGASTRODUODENOSCOPY N/A 01/09/2022   Procedure: ESOPHAGOGASTRODUODENOSCOPY (EGD);  Surgeon: [name], MD;  Location: [facility] ENDOSCOPY;  Service: Endoscopy;  Laterality: N/A;   FOOT SURGERY Left    FRACTURE SURGERY     IR IMAGING GUIDED PORT INSERTION  06/05/2022   ORIF ANKLE FRACTURE Left 03/26/2017   Procedure: OPEN REDUCTION INTERNAL FIXATION (ORIF) ANKLE FRACTURE;  Surgeon: Corky Mull, MD;  Location: [facility] ORS;  Service: Orthopedics;  Laterality: Left;    FAMILY HISTORY: Family History  Problem Relation Age of Onset   CVA Mother    Diabetes Mother    CAD Father    Breast cancer Neg Hx     ADVANCED DIRECTIVES (Y/N):  N  HEALTH MAINTENANCE: Social History   Tobacco Use   Smoking status: Former    Packs/day: 1.00    Years: 40.00    Total pack years: 40.00    Types: Cigarettes   Smokeless tobacco: Never  Vaping Use   Vaping Use: Never used  Substance Use Topics   Alcohol  use: No   Drug use: No     Colonoscopy:  PAP:  Bone density:  Lipid panel:  No Known Allergies  Current Outpatient Medications  Medication Sig Dispense Refill   albuterol (PROVENTIL HFA;VENTOLIN HFA) 108 (90 Base) MCG/ACT inhaler Inhale 2 puffs into the lungs every 6 (six) hours as needed for wheezing or shortness of breath.     amLODipine (NORVASC) 10 MG tablet Take 10 mg by mouth daily.      atorvastatin (LIPITOR) 80 MG tablet Take 80 mg by mouth daily.     buPROPion (WELLBUTRIN XL) 300 MG 24 hr tablet Take 300 mg by mouth daily.     citalopram (CELEXA) 20 MG tablet Take 20 mg by mouth daily.     cloNIDine (CATAPRES) 0.2 MG tablet Take 0.2 mg by mouth 2 (two) times daily.     insulin aspart (NOVOLOG) 100 UNIT/ML injection Inject 13 Units into the skin 3 (three) times daily before meals.     insulin detemir (LEVEMIR FLEXPEN) 100 UNIT/ML FlexPen Inject 25 units in the morning. Inject 23 units in the evening.     insulin glargine (LANTUS) 100 UNIT/ML injection Inject 25 Units into the skin 2 (two) times daily.     lidocaine-prilocaine (EMLA) cream Apply to affected area once 30 g 3   losartan-hydrochlorothiazide (HYZAAR) 100-25 MG tablet Take 1 tablet by mouth daily.     montelukast (SINGULAIR) 10 MG tablet Take 10 mg by mouth at bedtime.     ondansetron (ZOFRAN) 8 MG tablet Take 1 tablet (8 mg total) by mouth every 8 (eight) hours as needed for nausea or vomiting. Start on the third day after chemotherapy. 60 tablet 2   pantoprazole (PROTONIX) 20 MG tablet Take 20 mg by mouth daily.     prochlorperazine (COMPAZINE) 10 MG tablet Take 1 tablet (10 mg total) by mouth every 6 (six) hours as needed for nausea or vomiting. 60 tablet 2   cholecalciferol (VITAMIN D) 400 units TABS tablet Take 400 Units by mouth daily. (Patient not taking: Reported on 06/20/2022)     No current facility-administered medications for this visit.   Facility-Administered Medications Ordered in Other Visits  Medication Dose Route Frequency Provider Last Rate Last Admin   CARBOplatin (PARAPLATIN) 500 mg in sodium chloride 0.9 % 250 mL chemo infusion  500 mg Intravenous Once Lloyd Huger, MD       PACLitaxel (TAXOL) 348 mg in sodium chloride 0.9 % 500 mL chemo infusion (> 51m/m2)  175 mg/m2 (Treatment Plan Recorded) Intravenous Once FLloyd Huger MD 186 mL/hr at 06/20/22 1237 348 mg at 06/20/22 1237     OBJECTIVE: Vitals:   06/20/22 0920  BP: (!) 166/47  Pulse: (!) 50  Resp: 20  Temp: 97.9 F (36.6 C)  SpO2: 100%     Body mass index is 37.13 kg/m.    ECOG FS:0 - Asymptomatic  General: Well-developed, well-nourished, no acute distress. Eyes: Pink conjunctiva, anicteric sclera. HEENT: Normocephalic, moist mucous membranes. Lungs: No audible wheezing or coughing. Heart: Regular rate and rhythm. Abdomen: Soft, nontender, no obvious distention. Musculoskeletal: No edema, cyanosis, or clubbing. Neuro: Alert, answering all questions appropriately. Cranial nerves grossly intact. Skin: No rashes or petechiae noted. Psych: Normal affect.  LAB RESULTS:  Lab Results  Component Value Date   NA 142 06/20/2022   K 3.7 06/20/2022   CL 104 06/20/2022   CO2 30 06/20/2022   GLUCOSE 132 (H) 06/20/2022   BUN 19 06/20/2022   CREATININE  0.91 06/20/2022   CALCIUM 8.8 (L) 06/20/2022   PROT 6.5 06/20/2022   ALBUMIN 3.5 06/20/2022   AST 17 06/20/2022   ALT 16 06/20/2022   ALKPHOS 88 06/20/2022   BILITOT 0.5 06/20/2022   GFRNONAA >60 06/20/2022   GFRAA >60 03/27/2017    Lab Results  Component Value Date   WBC 5.2 06/20/2022   NEUTROABS 4.2 06/20/2022   HGB 11.7 (L) 06/20/2022   HCT 37.8 06/20/2022   MCV 84.9 06/20/2022   PLT 181 06/20/2022     STUDIES: IR IMAGING GUIDED PORT INSERTION  Result Date: 06/08/2022 INDICATION: Chemotherapy EXAM: Chest port placement using ultrasound and fluoroscopic guidance MEDICATIONS: Documented in the EMR ANESTHESIA/SEDATION: Moderate (conscious) sedation was employed during this procedure. A total of Versed 2 mg and Fentanyl 100 mcg was administered intravenously. Moderate Sedation Time: 32 minutes. The patient's level of consciousness and vital signs were monitored continuously by radiology nursing throughout the procedure under my direct supervision. FLUOROSCOPY TIME:  Fluoroscopy Time: 0.9 minutes (4 mGy) COMPLICATIONS: None immediate.  PROCEDURE: Informed written consent was obtained from the patient after a thorough discussion of the procedural risks, benefits and alternatives. All questions were addressed. Maximal Sterile Barrier Technique was utilized including caps, mask, sterile gowns, sterile gloves, sterile drape, hand hygiene and skin antiseptic. A timeout was performed prior to the initiation of the procedure. The patient was placed supine on the exam table. The right neck and chest was prepped and draped in the standard sterile fashion. A preliminary ultrasound of the right neck was performed and demonstrates a patent right internal jugular vein. A permanent ultrasound image was stored in the electronic medical record. The overlying skin was anesthetized with 1% Lidocaine. Using ultrasound guidance, access was obtained into the right internal jugular vein using a 21 gauge micropuncture set. A wire was advanced into the SVC, a short incision was made at the puncture site, and serial dilatation performed. Next, in an ipsilateral infraclavicular location, an incision was made at the site of the subcutaneous reservoir. Blunt dissection was used to open a pocket to contain the reservoir. A subcutaneous tunnel was then created from the port site to the puncture site. A(n) 8 Fr single lumen catheter was advanced through the tunnel. The catheter was attached to the port and this was placed in the subcutaneous pocket. Under fluoroscopic guidance, a peel away sheath was placed, and the catheter was trimmed to the appropriate length and was advanced into the central veins. The tip of the catheter lies near the superior cavoatrial junction. The port flushes and aspirates appropriately. The port was flushed and locked with heparinized saline. The port pocket was closed in 2 layers using 3-0 and 4-0 Vicryl/absorbable suture. Dermabond was also applied to both incisions. The patient tolerated the procedure well and was transferred to recovery in stable  condition. IMPRESSION: Successful placement of a right-sided chest port via the right internal jugular vein. The port is ready for immediate use. Electronically Signed   By: Albin Felling M.D.   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  Patient seen by GYN-Onc recently who did not think that surgery was a viable option at this point, plus patient is currently refusing surgical intervention.  CT scan results from  May 15, 2022 reviewed independently with residual malignancy.  The enlarged lymph nodes seen on imaging are not hypermetabolic.  Patient agreed to receive 3 cycles of carboplatinum, Taxol, and Keytruda every 3 weeks with Udenyca support followed by repeat PET scan and further evaluation.  Patient may require up to 6 cycles of treatment.  Patient has had port placement, but this is not functioning at this time.  She has agreed to proceed with cycle 1 of treatment with peripheral IV.  Return to clinic on Friday for Udenyca, in 1 week for laboratory work and further evaluation, in 3 weeks for consideration of cycle 2.   Port: Patient will be sent for dye study. Anemia: Mild, monitor. Hypertension: Patient's blood pressure is moderately elevated, monitor.  Patient expressed understanding and was in agreement with this plan. She also understands that She can call clinic at any time with any questions, concerns, or complaints.    Cancer Staging  Endometrial cancer [facility]) Staging form: Corpus Uteri - Carcinoma and Carcinosarcoma, AJCC 8th Edition - Clinical stage from 05/30/2022: FIGO Stage IIIB (cT3b, cN0, cM0) - Signed by Lloyd Huger, MD on 05/30/2022 Stage prefix: Initial diagnosis   Lloyd Huger, MD   06/20/2022  2:43 PM

## 2022-06-21 ENCOUNTER — Telehealth: Payer: Self-pay

## 2022-06-21 NOTE — Telephone Encounter (Signed)
Telephone call to patient for follow up after receiving first infusion.   No answer but left message stating we were calling to check on them.  Encouraged patient to call for any questions or concerns.   

## 2022-06-22 ENCOUNTER — Other Ambulatory Visit (HOSPITAL_COMMUNITY): Payer: Self-pay | Admitting: Interventional Radiology

## 2022-06-22 ENCOUNTER — Ambulatory Visit
Admission: RE | Admit: 2022-06-22 | Discharge: 2022-06-22 | Disposition: A | Discharge status: Home / Self Care | Payer: PPO | Source: Ambulatory Visit | Attending: Oncology | Admitting: Oncology

## 2022-06-22 ENCOUNTER — Inpatient Hospital Stay: Payer: PPO

## 2022-06-22 DIAGNOSIS — Z5112 Encounter for antineoplastic immunotherapy: Secondary | ICD-10-CM | POA: Diagnosis not present

## 2022-06-22 DIAGNOSIS — C579 Malignant neoplasm of female genital organ, unspecified: Secondary | ICD-10-CM

## 2022-06-22 DIAGNOSIS — T82528A Displacement of other cardiac and vascular devices and implants, initial encounter: Secondary | ICD-10-CM | POA: Diagnosis not present

## 2022-06-22 DIAGNOSIS — Z95828 Presence of other vascular implants and grafts: Secondary | ICD-10-CM

## 2022-06-22 DIAGNOSIS — Z452 Encounter for adjustment and management of vascular access device: Secondary | ICD-10-CM | POA: Diagnosis not present

## 2022-06-22 DIAGNOSIS — C541 Malignant neoplasm of endometrium: Secondary | ICD-10-CM

## 2022-06-22 HISTORY — PX: IR CV LINE INJECTION: IMG2294

## 2022-06-22 LAB — T4: T4, Total: 8.6 ug/dL (ref 4.5–12.0)

## 2022-06-22 MED ORDER — PEGFILGRASTIM-CBQV 6 MG/0.6ML ~~LOC~~ SOSY
6.0000 mg | PREFILLED_SYRINGE | Freq: Once | SUBCUTANEOUS | Status: AC
  Administered 2022-06-22: 6 mg via SUBCUTANEOUS
  Filled 2022-06-22: qty 0.6

## 2022-06-27 ENCOUNTER — Inpatient Hospital Stay: Payer: PPO

## 2022-06-27 ENCOUNTER — Inpatient Hospital Stay (HOSPITAL_BASED_OUTPATIENT_CLINIC_OR_DEPARTMENT_OTHER): Payer: PPO | Admitting: Oncology

## 2022-06-27 ENCOUNTER — Encounter: Payer: Self-pay | Admitting: Oncology

## 2022-06-27 VITALS — BP 180/61 | HR 58 | Temp 97.2°F | Resp 16 | Ht 62.0 in | Wt 200.0 lb

## 2022-06-27 DIAGNOSIS — C541 Malignant neoplasm of endometrium: Secondary | ICD-10-CM

## 2022-06-27 DIAGNOSIS — Z5112 Encounter for antineoplastic immunotherapy: Secondary | ICD-10-CM | POA: Diagnosis not present

## 2022-06-27 LAB — CMP (CANCER CENTER ONLY)
ALT: 19 U/L (ref 0–44)
AST: 17 U/L (ref 15–41)
Albumin: 3.3 g/dL — ABNORMAL LOW (ref 3.5–5.0)
Alkaline Phosphatase: 105 U/L (ref 38–126)
Anion gap: 10 (ref 5–15)
BUN: 20 mg/dL (ref 8–23)
CO2: 30 mmol/L (ref 22–32)
Calcium: 8.6 mg/dL — ABNORMAL LOW (ref 8.9–10.3)
Chloride: 99 mmol/L (ref 98–111)
Creatinine: 0.82 mg/dL (ref 0.44–1.00)
GFR, Estimated: >60 mL/min (ref >60)
Glucose, Bld: 210 mg/dL — ABNORMAL HIGH (ref 70–99)
Potassium: 3.9 mmol/L (ref 3.5–5.1)
Sodium: 139 mmol/L (ref 135–145)
Total Bilirubin: 0.3 mg/dL (ref 0.3–1.2)
Total Protein: 6.1 g/dL — ABNORMAL LOW (ref 6.5–8.1)

## 2022-06-27 LAB — CBC WITH DIFFERENTIAL/PLATELET
Abs Immature Granulocytes: 0.6 10*3/uL — ABNORMAL HIGH (ref 0.00–0.07)
Basophils Absolute: 0 10*3/uL (ref 0.0–0.1)
Basophils Relative: 1 %
Eosinophils Absolute: 0.2 10*3/uL (ref 0.0–0.5)
Eosinophils Relative: 3 %
HCT: 35.1 % — ABNORMAL LOW (ref 36.0–46.0)
Hemoglobin: 11 g/dL — ABNORMAL LOW (ref 12.0–15.0)
Immature Granulocytes: 8 %
Lymphocytes Relative: 8 %
Lymphs Abs: 0.6 10*3/uL — ABNORMAL LOW (ref 0.7–4.0)
MCH: 26.6 pg (ref 26.0–34.0)
MCHC: 31.3 g/dL (ref 30.0–36.0)
MCV: 84.8 fL (ref 80.0–100.0)
Monocytes Absolute: 0.8 10*3/uL (ref 0.1–1.0)
Monocytes Relative: 10 %
Neutro Abs: 5.5 10*3/uL (ref 1.7–7.7)
Neutrophils Relative %: 70 %
Platelets: 118 10*3/uL — ABNORMAL LOW (ref 150–400)
RBC: 4.14 MIL/uL (ref 3.87–5.11)
RDW: 14.3 % (ref 11.5–15.5)
Smear Review: NORMAL
WBC: 7.7 10*3/uL (ref 4.0–10.5)
nRBC: 0 % (ref 0.0–0.2)

## 2022-06-27 NOTE — Progress Notes (Signed)
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  INTERVAL HISTORY: Patient returns to clinic today for further evaluation and to assess her toleration of cycle 1 of carboplatinum, Taxol, and Keytruda.  She tolerated treatment well without significant side effects.  She currently feels well and is asymptomatic.  Recent evaluation of her port revealed that it has been flipped 180 degrees and she will require port revision in the near future.  She has no neurologic complaints.  She denies any recent fevers or illnesses.  She has a good appetite and denies weight loss.  She has no chest pain, shortness of breath, cough, or hemoptysis.  She denies any nausea, vomiting, constipation, or diarrhea.  She has no urinary complaints.  Patient offers no further specific complaints today.  REVIEW OF SYSTEMS:   Review of Systems  Constitutional: Negative.  Negative for fever, malaise/fatigue and weight loss.  Respiratory: Negative.  Negative for cough, hemoptysis and shortness of breath.   Cardiovascular: Negative.  Negative for chest pain and leg swelling.  Gastrointestinal: Negative.  Negative for abdominal pain, blood in stool and melena.  Genitourinary: Negative.  Negative for dysuria.  Musculoskeletal: Negative.  Negative for back pain.  Skin: Negative.  Negative for rash.  Neurological: Negative.  Negative for dizziness, focal weakness, weakness and headaches.  Psychiatric/Behavioral: Negative.  The patient is not nervous/anxious.     As per HPI. Otherwise, a complete review of systems is negative.  PAST MEDICAL HISTORY: Past Medical History:  Diagnosis Date   Adenomatous colon polyp     Aortic stenosis    Arthritis    Asthma    Claudication (HCC)    COPD (chronic obstructive pulmonary disease) ([place])    Depression    Diabetes mellitus without complication (HCC)    Esophageal dysphagia    GERD (gastroesophageal reflux disease)    Hyperlipemia    Hypertension    Obesity    Severe obesity (BMI 35.0-35.9 with comorbidity) ([place])     PAST SURGICAL HISTORY: Past Surgical History:  Procedure Laterality Date   BLADDER SURGERY     COLONOSCOPY WITH PROPOFOL N/A 08/05/2015   Procedure: COLONOSCOPY WITH PROPOFOL;  Surgeon: Josefine Class, MD;  Location: [facility] ENDOSCOPY;  Service: Endoscopy;  Laterality: N/A;   ESOPHAGOGASTRODUODENOSCOPY N/A 01/09/2022   Procedure: ESOPHAGOGASTRODUODENOSCOPY (EGD);  Surgeon: [name], MD;  Location: [facility] ENDOSCOPY;  Service: Endoscopy;  Laterality: N/A;   FOOT SURGERY Left    FRACTURE SURGERY     IR CV LINE INJECTION  06/22/2022   IR IMAGING GUIDED PORT INSERTION  06/05/2022   ORIF ANKLE FRACTURE Left 03/26/2017   Procedure: OPEN REDUCTION INTERNAL FIXATION (ORIF) ANKLE FRACTURE;  Surgeon: Corky Mull, MD;  Location: [facility] ORS;  Service: Orthopedics;  Laterality: Left;    FAMILY HISTORY: Family History  Problem Relation Age of Onset   CVA Mother    Diabetes Mother    CAD Father    Breast cancer Neg Hx     ADVANCED DIRECTIVES (Y/N):  N  HEALTH MAINTENANCE: Social History   Tobacco Use   Smoking status: Former    Packs/day: 1.00    Years: 40.00    Total pack years: 40.00    Types: Cigarettes  Smokeless tobacco: Never  Vaping Use   Vaping Use: Never used  Substance Use Topics   Alcohol use: No   Drug use: No     Colonoscopy:  PAP:  Bone density:  Lipid panel:  No Known Allergies  Current Outpatient Medications  Medication Sig Dispense Refill   albuterol (PROVENTIL HFA;VENTOLIN HFA) 108 (90 Base) MCG/ACT inhaler Inhale 2 puffs into the lungs every 6 (six) hours as needed for wheezing or shortness of  breath.     amLODipine (NORVASC) 10 MG tablet Take 10 mg by mouth daily.     atorvastatin (LIPITOR) 80 MG tablet Take 80 mg by mouth daily.     buPROPion (WELLBUTRIN XL) 300 MG 24 hr tablet Take 300 mg by mouth daily.     citalopram (CELEXA) 20 MG tablet Take 20 mg by mouth daily.     cloNIDine (CATAPRES) 0.2 MG tablet Take 0.2 mg by mouth 2 (two) times daily.     insulin aspart (NOVOLOG) 100 UNIT/ML injection Inject 13 Units into the skin 3 (three) times daily before meals.     insulin detemir (LEVEMIR FLEXPEN) 100 UNIT/ML FlexPen Inject 25 units in the morning. Inject 23 units in the evening.     insulin glargine (LANTUS) 100 UNIT/ML injection Inject 25 Units into the skin 2 (two) times daily.     lidocaine-prilocaine (EMLA) cream Apply to affected area once 30 g 3   losartan-hydrochlorothiazide (HYZAAR) 100-25 MG tablet Take 1 tablet by mouth daily.     montelukast (SINGULAIR) 10 MG tablet Take 10 mg by mouth at bedtime.     ondansetron (ZOFRAN) 8 MG tablet Take 1 tablet (8 mg total) by mouth every 8 (eight) hours as needed for nausea or vomiting. Start on the third day after chemotherapy. 60 tablet 2   pantoprazole (PROTONIX) 20 MG tablet Take 20 mg by mouth daily.     prochlorperazine (COMPAZINE) 10 MG tablet Take 1 tablet (10 mg total) by mouth every 6 (six) hours as needed for nausea or vomiting. 60 tablet 2   cholecalciferol (VITAMIN D) 400 units TABS tablet Take 400 Units by mouth daily. (Patient not taking: Reported on 06/20/2022)     No current facility-administered medications for this visit.    OBJECTIVE: Vitals:   06/27/22 0932  BP: (!) 180/61  Pulse: (!) 58  Resp: 16  Temp: (!) 97.2 F (36.2 C)  SpO2: 98%     Body mass index is 36.58 kg/m.    ECOG FS:0 - Asymptomatic  General: Well-developed, well-nourished, no acute distress. Eyes: Pink conjunctiva, anicteric sclera. HEENT: Normocephalic, moist mucous membranes. Lungs: No audible wheezing or coughing. Heart: Regular  rate and rhythm. Abdomen: Soft, nontender, no obvious distention. Musculoskeletal: No edema, cyanosis, or clubbing. Neuro: Alert, answering all questions appropriately. Cranial nerves grossly intact. Skin: No rashes or petechiae noted. Psych: Normal affect.  LAB RESULTS:  Lab Results  Component Value Date   NA 139 06/27/2022   K 3.9 06/27/2022   CL 99 06/27/2022   CO2 30 06/27/2022   GLUCOSE 210 (H) 06/27/2022   BUN 20 06/27/2022   CREATININE 0.82 06/27/2022   CALCIUM 8.6 (L) 06/27/2022   PROT 6.1 (L) 06/27/2022   ALBUMIN 3.3 (L) 06/27/2022   AST 17 06/27/2022   ALT 19 06/27/2022   ALKPHOS 105 06/27/2022   BILITOT 0.3 06/27/2022   GFRNONAA >60 06/27/2022   GFRAA >60 03/27/2017    Lab Results  Component Value Date   WBC 7.7 06/27/2022  NEUTROABS 5.5 06/27/2022   HGB 11.0 (L) 06/27/2022   HCT 35.1 (L) 06/27/2022   MCV 84.8 06/27/2022   PLT 118 (L) 06/27/2022     STUDIES: IR CV Line Injection  Result Date: 06/22/2022 INDICATION: Inability to access port catheter EXAM: PORT CATHETER INJECTION UNDER FLUOROSCOPY MEDICATIONS: No periprocedural antibiotics were indicated. ANESTHESIA/SEDATION: None required FLUOROSCOPY: Radiation Exposure Index (as provided by the fluoroscopic device): Less than 0.1 mGy Kerma COMPLICATIONS: None immediate. PROCEDURE: The port catheter was examined fluoroscopically. The radiodense markers were shown in inverse compared to initial placement imagea, indicating 180 degree flip of the port body. Catheter appeared intact. On exam, the incision appears well-healed. Overlying skin appears healthy. No redness, tenderness, or swelling. The port body was fairly secure in position within the subcutaneous tissues. The port could not be manipulated into acceptable positioning using external manual attempts. IMPRESSION: The port body has flipped 180 degrees, preventing percutaneous needle access. Patient will be scheduled for port revision to restore  accessibility. Electronically Signed   By: Lucrezia Europe M.D.   On: 06/22/2022 17:58   IR IMAGING GUIDED PORT INSERTION  Result Date: 06/08/2022 INDICATION: Chemotherapy EXAM: Chest port placement using ultrasound and fluoroscopic guidance MEDICATIONS: Documented in the EMR ANESTHESIA/SEDATION: Moderate (conscious) sedation was employed during this procedure. A total of Versed 2 mg and Fentanyl 100 mcg was administered intravenously. Moderate Sedation Time: 32 minutes. The patient's level of consciousness and vital signs were monitored continuously by radiology nursing throughout the procedure under my direct supervision. FLUOROSCOPY TIME:  Fluoroscopy Time: 0.9 minutes (4 mGy) COMPLICATIONS: None immediate. PROCEDURE: Informed written consent was obtained from the patient after a thorough discussion of the procedural risks, benefits and alternatives. All questions were addressed. Maximal Sterile Barrier Technique was utilized including caps, mask, sterile gowns, sterile gloves, sterile drape, hand hygiene and skin antiseptic. A timeout was performed prior to the initiation of the procedure. The patient was placed supine on the exam table. The right neck and chest was prepped and draped in the standard sterile fashion. A preliminary ultrasound of the right neck was performed and demonstrates a patent right internal jugular vein. A permanent ultrasound image was stored in the electronic medical record. The overlying skin was anesthetized with 1% Lidocaine. Using ultrasound guidance, access was obtained into the right internal jugular vein using a 21 gauge micropuncture set. A wire was advanced into the SVC, a short incision was made at the puncture site, and serial dilatation performed. Next, in an ipsilateral infraclavicular location, an incision was made at the site of the subcutaneous reservoir. Blunt dissection was used to open a pocket to contain the reservoir. A subcutaneous tunnel was then created from the  port site to the puncture site. A(n) 8 Fr single lumen catheter was advanced through the tunnel. The catheter was attached to the port and this was placed in the subcutaneous pocket. Under fluoroscopic guidance, a peel away sheath was placed, and the catheter was trimmed to the appropriate length and was advanced into the central veins. The tip of the catheter lies near the superior cavoatrial junction. The port flushes and aspirates appropriately. The port was flushed and locked with heparinized saline. The port pocket was closed in 2 layers using 3-0 and 4-0 Vicryl/absorbable suture. Dermabond was also applied to both incisions. The patient tolerated the procedure well and was transferred to recovery in stable condition. IMPRESSION: Successful placement of a right-sided chest port via the right internal jugular vein. The port is ready for immediate  use. Electronically Signed   By: Albin Felling M.D.   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  Patient was seen by GYN-Onc recently who did not think that surgery was a viable option at this point, plus patient is currently refusing surgical intervention.  CT scan results from May 15, 2022 reviewed independently with residual malignancy.  The enlarged lymph nodes seen on imaging are not hypermetabolic.  Patient agreed to receive 3 cycles of carboplatinum, Taxol, and Keytruda every 3 weeks with Udenyca support followed by repeat PET scan and further evaluation.  Patient may require up to 6 cycles of treatment patient will require port revision in the near future.  She tolerated cycle 1 of treatment without significant side effects.  Return to clinic in 2 weeks for further evaluation and  consideration of cycle 2.    Port: Flipped 180 degrees.  Port revision as above.   Anemia: Hemoglobin has trended down to 11.0, monitor.   Thrombocytopenia: Mild, monitor. Hypertension: Chronic and unchanged.  Patient's blood pressure is moderately elevated today.  Patient expressed understanding and was in agreement with this plan. She also understands that She can call clinic at any time with any questions, concerns, or complaints.    Cancer Staging  Endometrial cancer [facility]) Staging form: Corpus Uteri - Carcinoma and Carcinosarcoma, AJCC 8th Edition - Clinical stage from 05/30/2022: FIGO Stage IIIB (cT3b, cN0, cM0) - Signed by Lloyd Huger, MD on 05/30/2022 Stage prefix: Initial diagnosis   Lloyd Huger, MD   06/27/2022 9:44 AM

## 2022-06-29 NOTE — Progress Notes (Signed)
Patient for IR Port Revision/Repair on Mon 07/02/2022, I called and spoke with the patient on the phone and gave pre-procedure instructions. Pt was made aware to be here at 12:30p, NPO after MN prior to procedure as well as driver post procedure/recovery/discharge. Pt stated understanding.  Called 06/26/2022 and 06/29/2022

## 2022-07-02 ENCOUNTER — Encounter: Payer: Self-pay | Admitting: Radiology

## 2022-07-02 ENCOUNTER — Other Ambulatory Visit: Payer: Self-pay | Admitting: Student

## 2022-07-02 ENCOUNTER — Ambulatory Visit
Admission: RE | Admit: 2022-07-02 | Discharge: 2022-07-02 | Disposition: A | Discharge status: Home / Self Care | Payer: PPO | Source: Ambulatory Visit | Attending: Interventional Radiology | Admitting: Interventional Radiology

## 2022-07-02 DIAGNOSIS — T82514A Breakdown (mechanical) of infusion catheter, initial encounter: Secondary | ICD-10-CM | POA: Diagnosis not present

## 2022-07-02 DIAGNOSIS — E119 Type 2 diabetes mellitus without complications: Secondary | ICD-10-CM | POA: Insufficient documentation

## 2022-07-02 DIAGNOSIS — T82528A Displacement of other cardiac and vascular devices and implants, initial encounter: Secondary | ICD-10-CM | POA: Diagnosis not present

## 2022-07-02 DIAGNOSIS — J449 Chronic obstructive pulmonary disease, unspecified: Secondary | ICD-10-CM | POA: Insufficient documentation

## 2022-07-02 DIAGNOSIS — C579 Malignant neoplasm of female genital organ, unspecified: Secondary | ICD-10-CM | POA: Diagnosis not present

## 2022-07-02 DIAGNOSIS — Z452 Encounter for adjustment and management of vascular access device: Secondary | ICD-10-CM

## 2022-07-02 DIAGNOSIS — Z87891 Personal history of nicotine dependence: Secondary | ICD-10-CM | POA: Diagnosis not present

## 2022-07-02 DIAGNOSIS — Y849 Medical procedure, unspecified as the cause of abnormal reaction of the patient, or of later complication, without mention of misadventure at the time of the procedure: Secondary | ICD-10-CM | POA: Diagnosis not present

## 2022-07-02 DIAGNOSIS — I10 Essential (primary) hypertension: Secondary | ICD-10-CM | POA: Diagnosis not present

## 2022-07-02 DIAGNOSIS — Z794 Long term (current) use of insulin: Secondary | ICD-10-CM | POA: Diagnosis not present

## 2022-07-02 HISTORY — PX: IR PORT REPAIR CENTRAL VENOUS ACCESS DEVICE: IMG5775

## 2022-07-02 MED ORDER — FENTANYL CITRATE (PF) 100 MCG/2ML IJ SOLN
INTRAMUSCULAR | Status: AC | PRN
  Administered 2022-07-02: 50 ug via INTRAVENOUS
  Administered 2022-07-02: 25 ug via INTRAVENOUS

## 2022-07-02 MED ORDER — MIDAZOLAM HCL 2 MG/2ML IJ SOLN
INTRAMUSCULAR | Status: AC | PRN
  Administered 2022-07-02: 1 mg via INTRAVENOUS

## 2022-07-02 MED ORDER — CEFAZOLIN SODIUM-DEXTROSE 1-4 GM/50ML-% IV SOLN
INTRAVENOUS | Status: AC | PRN
  Administered 2022-07-02: 2 g via INTRAVENOUS

## 2022-07-02 MED ORDER — SODIUM CHLORIDE 0.9 % IV SOLN: INTRAVENOUS | Status: DC

## 2022-07-02 MED ORDER — MIDAZOLAM HCL 2 MG/2ML IJ SOLN
INTRAMUSCULAR | Status: AC
  Filled 2022-07-02: qty 2

## 2022-07-02 MED ORDER — MIDAZOLAM HCL 5 MG/5ML IJ SOLN
INTRAMUSCULAR | Status: AC | PRN
  Administered 2022-07-02: .5 mg via INTRAVENOUS

## 2022-07-02 MED ORDER — FENTANYL CITRATE (PF) 100 MCG/2ML IJ SOLN
INTRAMUSCULAR | Status: AC
  Filled 2022-07-02: qty 2

## 2022-07-02 MED ORDER — LIDOCAINE-EPINEPHRINE 1 %-1:100000 IJ SOLN
INTRAMUSCULAR | Status: AC
  Administered 2022-07-02: 10 mL via INTRADERMAL
  Filled 2022-07-02: qty 1

## 2022-07-02 MED ORDER — CEFAZOLIN SODIUM-DEXTROSE 2-4 GM/100ML-% IV SOLN
INTRAVENOUS | Status: AC
  Filled 2022-07-02: qty 100

## 2022-07-02 MED ORDER — HEPARIN SOD (PORK) LOCK FLUSH 100 UNIT/ML IV SOLN
INTRAVENOUS | Status: AC
  Administered 2022-07-02: 500 [IU]
  Filled 2022-07-02: qty 5

## 2022-07-02 MED ORDER — CEFAZOLIN SODIUM-DEXTROSE 2-4 GM/100ML-% IV SOLN: 2.0000 g | Freq: Once | INTRAVENOUS | Status: DC

## 2022-07-02 NOTE — Progress Notes (Signed)
Patient clinically stable post Port revision per Dr Denna Haggard, tolerated well. Vitals stable pre and post procedure. Denies complaints at post procedure. Received Versed 1.5 mg along with Fentanyl 75 mcg IV for procedure.

## 2022-07-02 NOTE — Procedures (Signed)
Interventional Radiology Procedure Note  Date of Procedure: 07/02/2022  Procedure: Port revision   Findings:  1. Right chest port revision and anchoring with 2-0 prolene    Complications: No immediate complications noted.   Estimated Blood Loss: minimal  Follow-up and Recommendations: 1. Ready for use    Albin Felling, MD  Vascular & Interventional Radiology  07/02/2022 3:19 PM

## 2022-07-02 NOTE — H&P (Signed)
Chief Complaint: Patient was seen in consultation today for image-guided port revision secondary to port malfunction  Referring Physician(s): Hassell,Daniel  Supervising Physician: Juliet Rude  Patient Status: [facility] - Out-pt  History of Present Illness: Terri [name] is a 72 y.o. female with PMH significant for asthma, COPD, diabetes mellitus, hypertension, and stage IIIb endometrial cancer. The patient had a port placed 06/05/22, and subsequently had an issue where the infusion nurses were unable to access the port. The patient was seen again by IR on 06/22/22 where it was determined that the port had flipped 180 degrees which prevented percutaneous access. The patient was subsequently scheduled for a port revision with IR.  Past Medical History:  Diagnosis Date   Adenomatous colon polyp    Aortic stenosis    Arthritis    Asthma    Claudication (HCC)    COPD (chronic obstructive pulmonary disease) ([place])    Depression    Diabetes mellitus without complication (HCC)    Esophageal dysphagia    GERD (gastroesophageal reflux disease)    Hyperlipemia    Hypertension    Obesity    Severe obesity (BMI 35.0-35.9 with comorbidity) ([place])     Past Surgical History:  Procedure Laterality Date   BLADDER SURGERY     COLONOSCOPY WITH PROPOFOL N/A 08/05/2015   Procedure: COLONOSCOPY WITH PROPOFOL;  Surgeon: Josefine Class, MD;  Location: [facility] ENDOSCOPY;  Service: Endoscopy;  Laterality: N/A;   ESOPHAGOGASTRODUODENOSCOPY N/A 01/09/2022   Procedure: ESOPHAGOGASTRODUODENOSCOPY (EGD);  Surgeon: [name], MD;  Location: [facility] ENDOSCOPY;  Service: Endoscopy;  Laterality: N/A;   FOOT SURGERY Left    FRACTURE SURGERY     IR CV LINE INJECTION  06/22/2022   IR IMAGING GUIDED PORT INSERTION  06/05/2022   ORIF ANKLE FRACTURE Left 03/26/2017   Procedure: OPEN REDUCTION INTERNAL FIXATION (ORIF) ANKLE FRACTURE;  Surgeon: Corky Mull, MD;  Location: [facility] ORS;  Service: Orthopedics;   Laterality: Left;    Allergies: Patient has no known allergies.  Medications: Prior to Admission medications   Medication Sig Start Date End Date Taking? Authorizing Provider  albuterol (PROVENTIL HFA;VENTOLIN HFA) 108 (90 Base) MCG/ACT inhaler Inhale 2 puffs into the lungs every 6 (six) hours as needed for wheezing or shortness of breath.   Yes [provider]  amLODipine (NORVASC) 10 MG tablet Take 10 mg by mouth daily.   Yes [provider]  atorvastatin (LIPITOR) 80 MG tablet Take 80 mg by mouth daily.   Yes [provider]  buPROPion (WELLBUTRIN XL) 300 MG 24 hr tablet Take 300 mg by mouth daily.   Yes [provider]  citalopram (CELEXA) 20 MG tablet Take 20 mg by mouth daily.   Yes [provider]  cloNIDine (CATAPRES) 0.2 MG tablet Take 0.2 mg by mouth 2 (two) times daily.   Yes [provider]  montelukast (SINGULAIR) 10 MG tablet Take 10 mg by mouth at bedtime.   Yes [provider]  cholecalciferol (VITAMIN D) 400 units TABS tablet Take 400 Units by mouth daily. Patient not taking: Reported on 06/20/2022    [provider]  insulin aspart (NOVOLOG) 100 UNIT/ML injection Inject 13 Units into the skin 3 (three) times daily before meals.    [provider]  insulin detemir (LEVEMIR FLEXPEN) 100 UNIT/ML FlexPen Inject 25 units in the morning. Inject 23 units in the evening. 12/14/20   [provider]  insulin glargine (LANTUS) 100 UNIT/ML injection Inject 25 Units into the  skin 2 (two) times daily.    [provider]  lidocaine-prilocaine (EMLA) cream Apply to affected area once 05/30/22   Lloyd Huger, MD  losartan-hydrochlorothiazide (HYZAAR) 100-25 MG tablet Take 1 tablet by mouth daily.    [provider]  ondansetron (ZOFRAN) 8 MG tablet Take 1 tablet (8 mg total) by mouth every 8 (eight) hours as needed for nausea or vomiting. Start on the third day after chemotherapy.  05/30/22   Lloyd Huger, MD  pantoprazole (PROTONIX) 20 MG tablet Take 20 mg by mouth daily.    [provider]  prochlorperazine (COMPAZINE) 10 MG tablet Take 1 tablet (10 mg total) by mouth every 6 (six) hours as needed for nausea or vomiting. 05/30/22   Lloyd Huger, MD     Family History  Problem Relation Age of Onset   CVA Mother    Diabetes Mother    CAD Father    Breast cancer Neg Hx     Social History   Socioeconomic History   Marital status: Single    Spouse name: Not on file   Number of children: Not on file   Years of education: Not on file   Highest education level: Not on file  Occupational History   Not on file  Tobacco Use   Smoking status: Former    Packs/day: 1.00    Years: 40.00    Total pack years: 40.00    Types: Cigarettes   Smokeless tobacco: Never  Vaping Use   Vaping Use: Never used  Substance and Sexual Activity   Alcohol use: No   Drug use: No   Sexual activity: Not on file  Other Topics Concern   Not on file  Social History Narrative   Not on file   Social Determinants of Health   Financial Resource Strain: Low Risk  (05/30/2022)   Overall Financial Resource Strain (CARDIA)    Difficulty of Paying Living Expenses: Not very hard  Food Insecurity: No Food Insecurity (05/30/2022)   Hunger Vital Sign    Worried About Running Out of Food in the Last Year: Never true    Ran Out of Food in the Last Year: Never true  Transportation Needs: Unmet Transportation Needs (06/27/2022)   PRAPARE - Hydrologist (Medical): Yes    Lack of Transportation (Non-Medical): Yes  Physical Activity: Not on file  Stress: Not on file  Social Connections: Not on file    Code Status: Full code  Review of Systems: A 12 point ROS discussed and pertinent positives are indicated in the HPI above.  All other systems are negative.  Review of Systems  Constitutional:  Negative for chills and fever.  Respiratory:   Negative for chest tightness and shortness of breath.   Cardiovascular:  Negative for chest pain and leg swelling.  Gastrointestinal:  Positive for diarrhea. Negative for constipation, nausea and vomiting.  Neurological:  Negative for dizziness and headaches.  Psychiatric/Behavioral:  Negative for confusion.     Vital Signs: BP (!) 180/68 (BP Location: Left Arm)   Pulse 84   Temp 97.8 F (36.6 C) (Oral)   Resp 20   Ht '5\' 1"'$  (1.549 m)   Wt 201 lb (91.2 kg)   SpO2 96%   BMI 37.98 kg/m     Physical Exam Vitals reviewed.  Constitutional:      General: She is not in acute distress. Cardiovascular:     Rate and Rhythm: Normal rate and  regular rhythm.     Pulses: Normal pulses.     Heart sounds: Normal heart sounds.  Pulmonary:     Effort: Pulmonary effort is normal.     Breath sounds: Normal breath sounds.  Skin:    General: Skin is warm and dry.  Neurological:     Mental Status: She is alert and oriented to person, place, and time.  Psychiatric:        Mood and Affect: Mood normal.        Behavior: Behavior normal.        Thought Content: Thought content normal.        Judgment: Judgment normal.     Imaging: IR CV Line Injection  Result Date: 06/22/2022 INDICATION: Inability to access port catheter EXAM: PORT CATHETER INJECTION UNDER FLUOROSCOPY MEDICATIONS: No periprocedural antibiotics were indicated. ANESTHESIA/SEDATION: None required FLUOROSCOPY: Radiation Exposure Index (as provided by the fluoroscopic device): Less than 0.1 mGy Kerma COMPLICATIONS: None immediate. PROCEDURE: The port catheter was examined fluoroscopically. The radiodense markers were shown in inverse compared to initial placement imagea, indicating 180 degree flip of the port body. Catheter appeared intact. On exam, the incision appears well-healed. Overlying skin appears healthy. No redness, tenderness, or swelling. The port body was fairly secure in position within the subcutaneous tissues. The port  could not be manipulated into acceptable positioning using external manual attempts. IMPRESSION: The port body has flipped 180 degrees, preventing percutaneous needle access. Patient will be scheduled for port revision to restore accessibility. Electronically Signed   By: Lucrezia Europe M.D.   On: 06/22/2022 17:58   IR IMAGING GUIDED PORT INSERTION  Result Date: 06/08/2022 INDICATION: Chemotherapy EXAM: Chest port placement using ultrasound and fluoroscopic guidance MEDICATIONS: Documented in the EMR ANESTHESIA/SEDATION: Moderate (conscious) sedation was employed during this procedure. A total of Versed 2 mg and Fentanyl 100 mcg was administered intravenously. Moderate Sedation Time: 32 minutes. The patient's level of consciousness and vital signs were monitored continuously by radiology nursing throughout the procedure under my direct supervision. FLUOROSCOPY TIME:  Fluoroscopy Time: 0.9 minutes (4 mGy) COMPLICATIONS: None immediate. PROCEDURE: Informed written consent was obtained from the patient after a thorough discussion of the procedural risks, benefits and alternatives. All questions were addressed. Maximal Sterile Barrier Technique was utilized including caps, mask, sterile gowns, sterile gloves, sterile drape, hand hygiene and skin antiseptic. A timeout was performed prior to the initiation of the procedure. The patient was placed supine on the exam table. The right neck and chest was prepped and draped in the standard sterile fashion. A preliminary ultrasound of the right neck was performed and demonstrates a patent right internal jugular vein. A permanent ultrasound image was stored in the electronic medical record. The overlying skin was anesthetized with 1% Lidocaine. Using ultrasound guidance, access was obtained into the right internal jugular vein using a 21 gauge micropuncture set. A wire was advanced into the SVC, a short incision was made at the puncture site, and serial dilatation performed.  Next, in an ipsilateral infraclavicular location, an incision was made at the site of the subcutaneous reservoir. Blunt dissection was used to open a pocket to contain the reservoir. A subcutaneous tunnel was then created from the port site to the puncture site. A(n) 8 Fr single lumen catheter was advanced through the tunnel. The catheter was attached to the port and this was placed in the subcutaneous pocket. Under fluoroscopic guidance, a peel away sheath was placed, and the catheter was trimmed to the appropriate length and  was advanced into the central veins. The tip of the catheter lies near the superior cavoatrial junction. The port flushes and aspirates appropriately. The port was flushed and locked with heparinized saline. The port pocket was closed in 2 layers using 3-0 and 4-0 Vicryl/absorbable suture. Dermabond was also applied to both incisions. The patient tolerated the procedure well and was transferred to recovery in stable condition. IMPRESSION: Successful placement of a right-sided chest port via the right internal jugular vein. The port is ready for immediate use. Electronically Signed   By: Albin Felling M.D.   On: 06/08/2022 09:27    Labs:  CBC: Recent Labs    04/12/22 1044 04/16/22 1104 06/20/22 0843 06/27/22 0906  WBC 5.1 5.0 5.2 7.7  HGB 11.1* 11.4* 11.7* 11.0*  HCT 35.1* 35.6* 37.8 35.1*  PLT 163 198 181 118*    COAGS: No results for input(s): "INR", "APTT" in the last 8760 hours.  BMP: Recent Labs    05/15/22 1121 06/20/22 0843 06/27/22 0906  NA  --  142 139  K  --  3.7 3.9  CL  --  104 99  CO2  --  30 30  GLUCOSE  --  132* 210*  BUN  --  19 20  CALCIUM  --  8.8* 8.6*  CREATININE 0.90 0.91 0.82  GFRNONAA  --  >60 >60    LIVER FUNCTION TESTS: Recent Labs    06/20/22 0843 06/27/22 0906  BILITOT 0.5 0.3  AST 17 17  ALT 16 19  ALKPHOS 88 105  PROT 6.5 6.1*  ALBUMIN 3.5 3.3*    TUMOR MARKERS: No results for input(s): "AFPTM", "CEA", "CA199",  "CHROMGRNA" in the last 8760 hours.  Assessment and Plan:  Terri [name] is a 72 yo female being seen today for image-guided port revision secondary to port malfunction. The port was found to have rotated 180 degrees in the patient's chest and no longer facilitated percutaneous access. She presents today for image-guided port revision and is looking forward to having her port functioning properly. Case has been reviewed with Dr Denna Haggard and is set to proceed on 07/02/22.   Risks and benefits of image guided port-a-catheter revision placement was discussed with the patient including, but not limited to bleeding, infection, pneumothorax, or fibrin sheath development and need for additional procedures.  All of the patient's questions were answered, patient is agreeable to proceed. Consent signed and in chart.   Thank you for this interesting consult.  I greatly enjoyed meeting Terri [name] and look forward to participating in their care.  A copy of this report was sent to the requesting provider on this date.  Electronically Signed: Lura Em, PA-C 07/02/2022, 2:12 PM   I spent a total of  25 Minutes in face to face in clinical consultation, greater than 50% of which was counseling/coordinating care for image-guided port revision secondary to port malfunction.

## 2022-07-10 MED FILL — Dexamethasone Sodium Phosphate Inj 100 MG/10ML: INTRAMUSCULAR | Qty: 1 | Status: AC

## 2022-07-10 MED FILL — Fosaprepitant Dimeglumine For IV Infusion 150 MG (Base Eq): INTRAVENOUS | Qty: 5 | Status: AC

## 2022-07-11 ENCOUNTER — Inpatient Hospital Stay: Payer: PPO

## 2022-07-11 ENCOUNTER — Encounter: Payer: Self-pay | Admitting: Oncology

## 2022-07-11 ENCOUNTER — Inpatient Hospital Stay (HOSPITAL_BASED_OUTPATIENT_CLINIC_OR_DEPARTMENT_OTHER): Payer: PPO | Admitting: Oncology

## 2022-07-11 ENCOUNTER — Inpatient Hospital Stay: Payer: PPO | Attending: Obstetrics and Gynecology

## 2022-07-11 DIAGNOSIS — I10 Essential (primary) hypertension: Secondary | ICD-10-CM | POA: Insufficient documentation

## 2022-07-11 DIAGNOSIS — C541 Malignant neoplasm of endometrium: Secondary | ICD-10-CM | POA: Diagnosis not present

## 2022-07-11 DIAGNOSIS — Z5111 Encounter for antineoplastic chemotherapy: Secondary | ICD-10-CM | POA: Diagnosis not present

## 2022-07-11 DIAGNOSIS — D649 Anemia, unspecified: Secondary | ICD-10-CM | POA: Diagnosis not present

## 2022-07-11 DIAGNOSIS — Z5112 Encounter for antineoplastic immunotherapy: Secondary | ICD-10-CM | POA: Diagnosis not present

## 2022-07-11 DIAGNOSIS — Z5189 Encounter for other specified aftercare: Secondary | ICD-10-CM | POA: Diagnosis not present

## 2022-07-11 LAB — COMPREHENSIVE METABOLIC PANEL
ALT: 17 U/L (ref 0–44)
AST: 17 U/L (ref 15–41)
Albumin: 3.3 g/dL — ABNORMAL LOW (ref 3.5–5.0)
Alkaline Phosphatase: 84 U/L (ref 38–126)
Anion gap: 7 (ref 5–15)
BUN: 22 mg/dL (ref 8–23)
CO2: 28 mmol/L (ref 22–32)
Calcium: 8.5 mg/dL — ABNORMAL LOW (ref 8.9–10.3)
Chloride: 102 mmol/L (ref 98–111)
Creatinine, Ser: 1.02 mg/dL — ABNORMAL HIGH (ref 0.44–1.00)
GFR, Estimated: 58 mL/min — ABNORMAL LOW (ref >60)
Glucose, Bld: 175 mg/dL — ABNORMAL HIGH (ref 70–99)
Potassium: 4 mmol/L (ref 3.5–5.1)
Sodium: 137 mmol/L (ref 135–145)
Total Bilirubin: 0.5 mg/dL (ref 0.3–1.2)
Total Protein: 5.9 g/dL — ABNORMAL LOW (ref 6.5–8.1)

## 2022-07-11 LAB — CBC WITH DIFFERENTIAL/PLATELET
Abs Immature Granulocytes: 0.02 10*3/uL (ref 0.00–0.07)
Basophils Absolute: 0.1 10*3/uL (ref 0.0–0.1)
Basophils Relative: 1 %
Eosinophils Absolute: 0.1 10*3/uL (ref 0.0–0.5)
Eosinophils Relative: 1 %
HCT: 33 % — ABNORMAL LOW (ref 36.0–46.0)
Hemoglobin: 10.5 g/dL — ABNORMAL LOW (ref 12.0–15.0)
Immature Granulocytes: 0 %
Lymphocytes Relative: 8 %
Lymphs Abs: 0.5 10*3/uL — ABNORMAL LOW (ref 0.7–4.0)
MCH: 27 pg (ref 26.0–34.0)
MCHC: 31.8 g/dL (ref 30.0–36.0)
MCV: 84.8 fL (ref 80.0–100.0)
Monocytes Absolute: 0.6 10*3/uL (ref 0.1–1.0)
Monocytes Relative: 12 %
Neutro Abs: 4.1 10*3/uL (ref 1.7–7.7)
Neutrophils Relative %: 78 %
Platelets: 193 10*3/uL (ref 150–400)
RBC: 3.89 MIL/uL (ref 3.87–5.11)
RDW: 15.7 % — ABNORMAL HIGH (ref 11.5–15.5)
WBC: 5.3 10*3/uL (ref 4.0–10.5)
nRBC: 0 % (ref 0.0–0.2)

## 2022-07-11 MED ORDER — PALONOSETRON HCL INJECTION 0.25 MG/5ML
0.2500 mg | Freq: Once | INTRAVENOUS | Status: AC
  Administered 2022-07-11: 0.25 mg via INTRAVENOUS
  Filled 2022-07-11: qty 5

## 2022-07-11 MED ORDER — SODIUM CHLORIDE 0.9 % IV SOLN
484.5000 mg | Freq: Once | INTRAVENOUS | Status: AC
  Administered 2022-07-11: 480 mg via INTRAVENOUS
  Filled 2022-07-11: qty 48

## 2022-07-11 MED ORDER — SODIUM CHLORIDE 0.9 % IV SOLN
200.0000 mg | Freq: Once | INTRAVENOUS | Status: AC
  Administered 2022-07-11: 200 mg via INTRAVENOUS
  Filled 2022-07-11: qty 8

## 2022-07-11 MED ORDER — SODIUM CHLORIDE 0.9 % IV SOLN
150.0000 mg | Freq: Once | INTRAVENOUS | Status: AC
  Administered 2022-07-11: 150 mg via INTRAVENOUS
  Filled 2022-07-11: qty 150

## 2022-07-11 MED ORDER — SODIUM CHLORIDE 0.9 % IV SOLN
Freq: Once | INTRAVENOUS | Status: AC
  Filled 2022-07-11: qty 250

## 2022-07-11 MED ORDER — SODIUM CHLORIDE 0.9 % IV SOLN
10.0000 mg | Freq: Once | INTRAVENOUS | Status: AC
  Administered 2022-07-11: 10 mg via INTRAVENOUS
  Filled 2022-07-11: qty 10

## 2022-07-11 MED ORDER — SODIUM CHLORIDE 0.9 % IV SOLN
175.0000 mg/m2 | Freq: Once | INTRAVENOUS | Status: AC
  Administered 2022-07-11: 348 mg via INTRAVENOUS
  Filled 2022-07-11: qty 58

## 2022-07-11 MED ORDER — HEPARIN SOD (PORK) LOCK FLUSH 100 UNIT/ML IV SOLN
500.0000 [IU] | Freq: Once | INTRAVENOUS | Status: AC | PRN
  Administered 2022-07-11: 500 [IU]
  Filled 2022-07-11: qty 5

## 2022-07-11 MED ORDER — DIPHENHYDRAMINE HCL 50 MG/ML IJ SOLN
25.0000 mg | Freq: Once | INTRAMUSCULAR | Status: AC
  Administered 2022-07-11: 25 mg via INTRAVENOUS
  Filled 2022-07-11: qty 1

## 2022-07-11 MED ORDER — FAMOTIDINE IN NACL 20-0.9 MG/50ML-% IV SOLN
20.0000 mg | Freq: Once | INTRAVENOUS | Status: AC
  Administered 2022-07-11: 20 mg via INTRAVENOUS
  Filled 2022-07-11: qty 50

## 2022-07-11 NOTE — Progress Notes (Signed)
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  INTERVAL HISTORY: Patient returns to clinic today for further evaluation and consideration of cycle 2 of carboplatinum, Taxol, and Keytruda.  She currently feels well and is asymptomatic.  She is tolerating her treatments without significant side effects.  She has no neurologic complaints.  She denies any recent fevers or illnesses.  She has a good appetite and denies weight loss.  She has no chest pain, shortness of breath, cough, or hemoptysis.  She denies any nausea, vomiting, constipation, or diarrhea.  She has no urinary complaints.  Patient offers no specific complaints today.  REVIEW OF SYSTEMS:   Review of Systems  Constitutional: Negative.  Negative for fever, malaise/fatigue and weight loss.  Respiratory: Negative.  Negative for cough, hemoptysis and shortness of breath.   Cardiovascular: Negative.  Negative for chest pain and leg swelling.  Gastrointestinal: Negative.  Negative for abdominal pain, blood in stool and melena.  Genitourinary: Negative.  Negative for dysuria.  Musculoskeletal: Negative.  Negative for back pain.  Skin: Negative.  Negative for rash.  Neurological: Negative.  Negative for dizziness, focal weakness, weakness and headaches.  Psychiatric/Behavioral: Negative.  The patient is not nervous/anxious.     As per HPI. Otherwise, a complete review of systems is negative.  PAST MEDICAL HISTORY: Past Medical History:  Diagnosis Date   Adenomatous colon polyp    Aortic stenosis    Arthritis    Asthma    Claudication (HCC)    COPD (chronic obstructive pulmonary disease) ([place])    Depression    Diabetes  mellitus without complication (HCC)    Esophageal dysphagia    GERD (gastroesophageal reflux disease)    Hyperlipemia    Hypertension    Obesity    Severe obesity (BMI 35.0-35.9 with comorbidity) ([place])     PAST SURGICAL HISTORY: Past Surgical History:  Procedure Laterality Date   BLADDER SURGERY     COLONOSCOPY WITH PROPOFOL N/A 08/05/2015   Procedure: COLONOSCOPY WITH PROPOFOL;  Surgeon: Josefine Class, MD;  Location: [facility] ENDOSCOPY;  Service: Endoscopy;  Laterality: N/A;   ESOPHAGOGASTRODUODENOSCOPY N/A 01/09/2022   Procedure: ESOPHAGOGASTRODUODENOSCOPY (EGD);  Surgeon: [name], MD;  Location: [facility] ENDOSCOPY;  Service: Endoscopy;  Laterality: N/A;   FOOT SURGERY Left    FRACTURE SURGERY     IR CV LINE INJECTION  06/22/2022   IR IMAGING GUIDED PORT INSERTION  06/05/2022   IR PORT REPAIR CENTRAL VENOUS ACCESS DEVICE  07/02/2022   ORIF ANKLE FRACTURE Left 03/26/2017   Procedure: OPEN REDUCTION INTERNAL FIXATION (ORIF) ANKLE FRACTURE;  Surgeon: Corky Mull, MD;  Location: [facility] ORS;  Service: Orthopedics;  Laterality: Left;    FAMILY HISTORY: Family History  Problem Relation Age of Onset   CVA Mother    Diabetes Mother    CAD Father    Breast cancer Neg Hx     ADVANCED DIRECTIVES (Y/N):  N  HEALTH MAINTENANCE: Social History   Tobacco Use   Smoking status: Former    Packs/day: 1.00    Years: 40.00    Total pack years: 40.00    Types: Cigarettes   Smokeless tobacco: Never  Vaping Use   Vaping Use: Never used  Substance Use Topics  Alcohol use: No   Drug use: No     Colonoscopy:  PAP:  Bone density:  Lipid panel:  No Known Allergies  Current Outpatient Medications  Medication Sig Dispense Refill   albuterol (PROVENTIL HFA;VENTOLIN HFA) 108 (90 Base) MCG/ACT inhaler Inhale 2 puffs into the lungs every 6 (six) hours as needed for wheezing or shortness of breath.     amLODipine (NORVASC) 10 MG tablet Take 10 mg by mouth daily.     atorvastatin  (LIPITOR) 80 MG tablet Take 80 mg by mouth daily.     buPROPion (WELLBUTRIN XL) 300 MG 24 hr tablet Take 300 mg by mouth daily.     cholecalciferol (VITAMIN D) 400 units TABS tablet Take 400 Units by mouth daily.     citalopram (CELEXA) 20 MG tablet Take 20 mg by mouth daily.     cloNIDine (CATAPRES) 0.2 MG tablet Take 0.2 mg by mouth 2 (two) times daily.     insulin aspart (NOVOLOG) 100 UNIT/ML injection Inject 13 Units into the skin 3 (three) times daily before meals.     insulin detemir (LEVEMIR FLEXPEN) 100 UNIT/ML FlexPen Inject 25 units in the morning. Inject 23 units in the evening.     insulin glargine (LANTUS) 100 UNIT/ML injection Inject 25 Units into the skin 2 (two) times daily.     lidocaine-prilocaine (EMLA) cream Apply to affected area once 30 g 3   losartan-hydrochlorothiazide (HYZAAR) 100-25 MG tablet Take 1 tablet by mouth daily.     metoprolol succinate (TOPROL-XL) 50 MG 24 hr tablet Take 1 tablet by mouth daily.     montelukast (SINGULAIR) 10 MG tablet Take 10 mg by mouth at bedtime.     ondansetron (ZOFRAN) 8 MG tablet Take by mouth.     pantoprazole (PROTONIX) 20 MG tablet Take 20 mg by mouth daily.     prochlorperazine (COMPAZINE) 10 MG tablet Take 1 tablet (10 mg total) by mouth every 6 (six) hours as needed for nausea or vomiting. 60 tablet 2   No current facility-administered medications for this visit.   Facility-Administered Medications Ordered in Other Visits  Medication Dose Route Frequency Provider Last Rate Last Admin   CARBOplatin (PARAPLATIN) 480 mg in sodium chloride 0.9 % 250 mL chemo infusion  480 mg Intravenous Once Lloyd Huger, MD       PACLitaxel (TAXOL) 348 mg in sodium chloride 0.9 % 500 mL chemo infusion (> '80mg'$ /m2)  175 mg/m2 (Treatment Plan Recorded) Intravenous Once Lloyd Huger, MD 186 mL/hr at 07/11/22 1107 348 mg at 07/11/22 1107    OBJECTIVE: Vitals:   07/11/22 0858  BP: (!) 121/58  Pulse: 71  Resp: 18  Temp: 98 F (36.7  C)  SpO2: 100%     Body mass index is 38.05 kg/m.    ECOG FS:0 - Asymptomatic  General: Well-developed, well-nourished, no acute distress. Eyes: Pink conjunctiva, anicteric sclera. HEENT: Normocephalic, moist mucous membranes. Lungs: No audible wheezing or coughing. Heart: Regular rate and rhythm. Abdomen: Soft, nontender, no obvious distention. Musculoskeletal: No edema, cyanosis, or clubbing. Neuro: Alert, answering all questions appropriately. Cranial nerves grossly intact. Skin: No rashes or petechiae noted. Psych: Normal affect.  LAB RESULTS:  Lab Results  Component Value Date   NA 137 07/11/2022   K 4.0 07/11/2022   CL 102 07/11/2022   CO2 28 07/11/2022   GLUCOSE 175 (H) 07/11/2022   BUN 22 07/11/2022   CREATININE 1.02 (H) 07/11/2022   CALCIUM 8.5 (L) 07/11/2022  PROT 5.9 (L) 07/11/2022   ALBUMIN 3.3 (L) 07/11/2022   AST 17 07/11/2022   ALT 17 07/11/2022   ALKPHOS 84 07/11/2022   BILITOT 0.5 07/11/2022   GFRNONAA 58 (L) 07/11/2022   GFRAA >60 03/27/2017    Lab Results  Component Value Date   WBC 5.3 07/11/2022   NEUTROABS 4.1 07/11/2022   HGB 10.5 (L) 07/11/2022   HCT 33.0 (L) 07/11/2022   MCV 84.8 07/11/2022   PLT 193 07/11/2022     STUDIES: IR Port Repair Central Venous Access Device  Result Date: 07/02/2022 INDICATION: Port flipped, cant access,  cant unflip externally EXAM: Port revision using fluoroscopic guidance MEDICATIONS: Documented in the EMR ANESTHESIA/SEDATION: Moderate (conscious) sedation was employed during this procedure. A total of Versed 1.5 mg and Fentanyl 75 mcg was administered intravenously. Moderate Sedation Time: 32 minutes. The patient's level of consciousness and vital signs were monitored continuously by radiology nursing throughout the procedure under my direct supervision. FLUOROSCOPY TIME:  Fluoroscopy Time: 0.1 minutes (<1 mGy) COMPLICATIONS: None immediate. PROCEDURE: Informed written consent was obtained from the patient  after a thorough discussion of the procedural risks, benefits and alternatives. All questions were addressed. Maximal Sterile Barrier Technique was utilized including caps, mask, sterile gowns, sterile gloves, sterile drape, hand hygiene and skin antiseptic. A timeout was performed prior to the initiation of the procedure. The right chest was prepped and draped in the standard sterile fashion. Lidocaine was utilized for local anesthesia. An incision was made over the previously healed surgical incision. Utilizing blunt dissection, the port reservoir was exposed. The port was then anchored to the soft tissues of the floor of the chest pocket using x2 2-0 Prolene sutures. The port was found to flush and aspirate appropriately. It was locked with heparinized saline. After hemostasis, the subcutaneous pocket was closed in two layers using 3-0 and 4-0 Vicryl. Dermabond was also applied. The patient tolerated the procedure well without immediate complication. IMPRESSION: Successful revision of right chest port. The chest port is ready for immediate use. Electronically Signed   By: Albin Felling M.D.   On: 07/02/2022 15:25   IR CV Line Injection  Result Date: 06/22/2022 INDICATION: Inability to access port catheter EXAM: PORT CATHETER INJECTION UNDER FLUOROSCOPY MEDICATIONS: No periprocedural antibiotics were indicated. ANESTHESIA/SEDATION: None required FLUOROSCOPY: Radiation Exposure Index (as provided by the fluoroscopic device): Less than 0.1 mGy Kerma COMPLICATIONS: None immediate. PROCEDURE: The port catheter was examined fluoroscopically. The radiodense markers were shown in inverse compared to initial placement imagea, indicating 180 degree flip of the port body. Catheter appeared intact. On exam, the incision appears well-healed. Overlying skin appears healthy. No redness, tenderness, or swelling. The port body was fairly secure in position within the subcutaneous tissues. The port could not be manipulated  into acceptable positioning using external manual attempts. IMPRESSION: The port body has flipped 180 degrees, preventing percutaneous needle access. Patient will be scheduled for port revision to restore accessibility. Electronically Signed   By: Lucrezia Europe M.D.   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  Patient was seen by GYN-Onc recently who did not think that surgery was a viable option at this point, plus patient is currently refusing surgical intervention.  CT scan results from May 15, 2022 reviewed independently with residual malignancy.  The enlarged lymph nodes seen on imaging are not hypermetabolic.  Patient agreed to receive 3 cycles of carboplatinum, Taxol, and Keytruda every 3 weeks with Udenyca support followed by repeat PET scan and further evaluation.  Patient may require up to 6 cycles of treatment.  Proceed with cycle 2 of treatment today.  Return to clinic in 3 weeks for further evaluation and consideration of cycle 3.  Port: Port revision successful.  Proceed with treatment as above. Anemia: Hemoglobin has trended down slightly to 10.5, monitor. Thrombocytopenia: Resolved. Hypertension: Patient's blood pressure is essentially within normal limits today.  Monitor.   Patient expressed understanding and was in agreement with this plan. She also understands that She can call clinic at any time with any questions, concerns, or complaints.    Cancer Staging  Endometrial cancer [facility]) Staging form: Corpus Uteri - Carcinoma and Carcinosarcoma, AJCC 8th Edition - Clinical stage from 05/30/2022: FIGO Stage IIIB (cT3b, cN0, cM0) - Signed by Lloyd Huger, MD on 05/30/2022 Stage prefix: Initial diagnosis   Lloyd Huger, MD   07/11/2022 12:21 PM

## 2022-07-11 NOTE — Progress Notes (Signed)
Patient has some concerns about diarrhea.

## 2022-07-11 NOTE — Patient Instructions (Signed)
[facility]  Discharge Instructions: Thank you for choosing [facility] to provide your oncology and hematology care.  If you have a lab appointment with the Cancer Center, please go directly to the Cancer Center and check in at the registration area.  Wear comfortable clothing and clothing appropriate for easy access to any Portacath or PICC line.   We strive to give you quality time with your provider. You may need to reschedule your appointment if you arrive late (15 or more minutes).  Arriving late affects you and other patients whose appointments are after yours.  Also, if you miss three or more appointments without notifying the office, you may be dismissed from the clinic at the provider's discretion.      For prescription refill requests, have your pharmacy contact our office and allow 72 hours for refills to be completed.     To help prevent nausea and vomiting after your treatment, we encourage you to take your nausea medication as directed.  BELOW ARE SYMPTOMS THAT SHOULD BE REPORTED IMMEDIATELY: *FEVER GREATER THAN 100.4 F (38 C) OR HIGHER *CHILLS OR SWEATING *NAUSEA AND VOMITING THAT IS NOT CONTROLLED WITH YOUR NAUSEA MEDICATION *UNUSUAL SHORTNESS OF BREATH *UNUSUAL BRUISING OR BLEEDING *URINARY PROBLEMS (pain or burning when urinating, or frequent urination) *BOWEL PROBLEMS (unusual diarrhea, constipation, pain near the anus) TENDERNESS IN MOUTH AND THROAT WITH OR WITHOUT PRESENCE OF ULCERS (sore throat, sores in mouth, or a toothache) UNUSUAL RASH, SWELLING OR PAIN  UNUSUAL VAGINAL DISCHARGE OR ITCHING   Items with * indicate a potential emergency and should be followed up as soon as possible or go to the Emergency Department if any problems should occur.  Please show the CHEMOTHERAPY ALERT CARD or IMMUNOTHERAPY ALERT CARD at check-in to the Emergency Department and triage nurse.  Should you have questions after your visit  or need to cancel or reschedule your appointment, please contact [facility]  [phone] and follow the prompts.  Office hours are 8:00 a.m. to 4:30 p.m. Monday - Friday. Please note that voicemails left after 4:00 p.m. may not be returned until the following business day.  We are closed weekends and major holidays. You have access to a nurse at all times for urgent questions. Please call the main number to the clinic [phone] and follow the prompts.  For any non-urgent questions, you may also contact your provider using MyChart. We now offer e-Visits for anyone 18 and older to request care online for non-urgent symptoms. For details visit [email or web address].   Also download the MyChart app! Go to the app store, search "MyChart", open the app, select [facility], and log in with your MyChart username and password.    

## 2022-07-13 ENCOUNTER — Inpatient Hospital Stay: Payer: PPO

## 2022-07-13 VITALS — BP 133/50 | HR 60 | Resp 18

## 2022-07-13 DIAGNOSIS — C541 Malignant neoplasm of endometrium: Secondary | ICD-10-CM

## 2022-07-13 DIAGNOSIS — Z5112 Encounter for antineoplastic immunotherapy: Secondary | ICD-10-CM | POA: Diagnosis not present

## 2022-07-13 MED ORDER — PEGFILGRASTIM-CBQV 6 MG/0.6ML ~~LOC~~ SOSY
6.0000 mg | PREFILLED_SYRINGE | Freq: Once | SUBCUTANEOUS | Status: AC
  Administered 2022-07-13: 6 mg via SUBCUTANEOUS
  Filled 2022-07-13: qty 0.6

## 2022-07-31 ENCOUNTER — Other Ambulatory Visit: Payer: Self-pay

## 2022-07-31 MED FILL — Dexamethasone Sodium Phosphate Inj 100 MG/10ML: INTRAMUSCULAR | Qty: 1 | Status: AC

## 2022-08-01 ENCOUNTER — Inpatient Hospital Stay (HOSPITAL_BASED_OUTPATIENT_CLINIC_OR_DEPARTMENT_OTHER): Payer: PPO | Admitting: Oncology

## 2022-08-01 ENCOUNTER — Telehealth: Payer: Self-pay | Admitting: Oncology

## 2022-08-01 ENCOUNTER — Inpatient Hospital Stay: Payer: PPO | Attending: Obstetrics and Gynecology

## 2022-08-01 ENCOUNTER — Encounter: Payer: Self-pay | Admitting: Oncology

## 2022-08-01 ENCOUNTER — Inpatient Hospital Stay: Payer: PPO

## 2022-08-01 VITALS — BP 126/62 | HR 54 | Temp 97.9°F | Resp 18 | Wt 204.4 lb

## 2022-08-01 DIAGNOSIS — D649 Anemia, unspecified: Secondary | ICD-10-CM | POA: Insufficient documentation

## 2022-08-01 DIAGNOSIS — I10 Essential (primary) hypertension: Secondary | ICD-10-CM | POA: Diagnosis not present

## 2022-08-01 DIAGNOSIS — C541 Malignant neoplasm of endometrium: Secondary | ICD-10-CM

## 2022-08-01 DIAGNOSIS — D696 Thrombocytopenia, unspecified: Secondary | ICD-10-CM | POA: Diagnosis not present

## 2022-08-01 DIAGNOSIS — Z5111 Encounter for antineoplastic chemotherapy: Secondary | ICD-10-CM | POA: Insufficient documentation

## 2022-08-01 DIAGNOSIS — Z5112 Encounter for antineoplastic immunotherapy: Secondary | ICD-10-CM | POA: Diagnosis not present

## 2022-08-01 DIAGNOSIS — Z5189 Encounter for other specified aftercare: Secondary | ICD-10-CM | POA: Insufficient documentation

## 2022-08-01 LAB — CBC WITH DIFFERENTIAL/PLATELET
Abs Immature Granulocytes: 0.04 10*3/uL (ref 0.00–0.07)
Basophils Absolute: 0.1 10*3/uL (ref 0.0–0.1)
Basophils Relative: 1 %
Eosinophils Absolute: 0 10*3/uL (ref 0.0–0.5)
Eosinophils Relative: 1 %
HCT: 30.7 % — ABNORMAL LOW (ref 36.0–46.0)
Hemoglobin: 9.9 g/dL — ABNORMAL LOW (ref 12.0–15.0)
Immature Granulocytes: 1 %
Lymphocytes Relative: 6 %
Lymphs Abs: 0.4 10*3/uL — ABNORMAL LOW (ref 0.7–4.0)
MCH: 27.4 pg (ref 26.0–34.0)
MCHC: 32.2 g/dL (ref 30.0–36.0)
MCV: 85 fL (ref 80.0–100.0)
Monocytes Absolute: 0.7 10*3/uL (ref 0.1–1.0)
Monocytes Relative: 12 %
Neutro Abs: 4.8 10*3/uL (ref 1.7–7.7)
Neutrophils Relative %: 79 %
Platelets: 100 10*3/uL — ABNORMAL LOW (ref 150–400)
RBC: 3.61 MIL/uL — ABNORMAL LOW (ref 3.87–5.11)
RDW: 17.8 % — ABNORMAL HIGH (ref 11.5–15.5)
WBC: 5.9 10*3/uL (ref 4.0–10.5)
nRBC: 0 % (ref 0.0–0.2)

## 2022-08-01 LAB — COMPREHENSIVE METABOLIC PANEL
ALT: 16 U/L (ref 0–44)
AST: 15 U/L (ref 15–41)
Albumin: 3.4 g/dL — ABNORMAL LOW (ref 3.5–5.0)
Alkaline Phosphatase: 83 U/L (ref 38–126)
Anion gap: 8 (ref 5–15)
BUN: 21 mg/dL (ref 8–23)
CO2: 27 mmol/L (ref 22–32)
Calcium: 8.4 mg/dL — ABNORMAL LOW (ref 8.9–10.3)
Chloride: 102 mmol/L (ref 98–111)
Creatinine, Ser: 0.93 mg/dL (ref 0.44–1.00)
GFR, Estimated: >60 mL/min (ref >60)
Glucose, Bld: 179 mg/dL — ABNORMAL HIGH (ref 70–99)
Potassium: 4.2 mmol/L (ref 3.5–5.1)
Sodium: 137 mmol/L (ref 135–145)
Total Bilirubin: 0.4 mg/dL (ref 0.3–1.2)
Total Protein: 5.8 g/dL — ABNORMAL LOW (ref 6.5–8.1)

## 2022-08-01 LAB — TSH: TSH: 2.432 u[IU]/mL (ref 0.350–4.500)

## 2022-08-01 MED ORDER — FAMOTIDINE IN NACL 20-0.9 MG/50ML-% IV SOLN
20.0000 mg | Freq: Once | INTRAVENOUS | Status: AC
  Administered 2022-08-01: 20 mg via INTRAVENOUS
  Filled 2022-08-01: qty 50

## 2022-08-01 MED ORDER — SODIUM CHLORIDE 0.9 % IV SOLN
150.0000 mg | Freq: Once | INTRAVENOUS | Status: AC
  Administered 2022-08-01: 150 mg via INTRAVENOUS
  Filled 2022-08-01: qty 150

## 2022-08-01 MED ORDER — SODIUM CHLORIDE 0.9 % IV SOLN
Freq: Once | INTRAVENOUS | Status: AC
  Filled 2022-08-01: qty 250

## 2022-08-01 MED ORDER — PALONOSETRON HCL INJECTION 0.25 MG/5ML
0.2500 mg | Freq: Once | INTRAVENOUS | Status: AC
  Administered 2022-08-01: 0.25 mg via INTRAVENOUS
  Filled 2022-08-01: qty 5

## 2022-08-01 MED ORDER — DIPHENHYDRAMINE HCL 50 MG/ML IJ SOLN
25.0000 mg | Freq: Once | INTRAMUSCULAR | Status: AC
  Administered 2022-08-01: 25 mg via INTRAVENOUS
  Filled 2022-08-01: qty 1

## 2022-08-01 MED ORDER — SODIUM CHLORIDE 0.9 % IV SOLN
175.0000 mg/m2 | Freq: Once | INTRAVENOUS | Status: AC
  Administered 2022-08-01: 348 mg via INTRAVENOUS
  Filled 2022-08-01: qty 58

## 2022-08-01 MED ORDER — SODIUM CHLORIDE 0.9 % IV SOLN
492.0000 mg | Freq: Once | INTRAVENOUS | Status: AC
  Administered 2022-08-01: 490 mg via INTRAVENOUS
  Filled 2022-08-01: qty 49

## 2022-08-01 MED ORDER — SODIUM CHLORIDE 0.9 % IV SOLN
200.0000 mg | Freq: Once | INTRAVENOUS | Status: AC
  Administered 2022-08-01: 200 mg via INTRAVENOUS
  Filled 2022-08-01: qty 8

## 2022-08-01 MED ORDER — SODIUM CHLORIDE 0.9 % IV SOLN
10.0000 mg | Freq: Once | INTRAVENOUS | Status: AC
  Administered 2022-08-01: 10 mg via INTRAVENOUS
  Filled 2022-08-01: qty 10

## 2022-08-01 MED ORDER — HEPARIN SOD (PORK) LOCK FLUSH 100 UNIT/ML IV SOLN
500.0000 [IU] | Freq: Once | INTRAVENOUS | Status: AC | PRN
  Administered 2022-08-01: 500 [IU]
  Filled 2022-08-01: qty 5

## 2022-08-01 NOTE — Patient Instructions (Signed)

## 2022-08-01 NOTE — Progress Notes (Signed)
Patient here today for follow up and treatment consideration regarding endometrial cancer. Patient denies concerns today.

## 2022-08-01 NOTE — Progress Notes (Unsigned)
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  INTERVAL HISTORY: Patient returns to clinic today for further evaluation and consideration of cycle 3 of carboplatinum, Taxol, and Keytruda.  She continues to tolerate her treatments well without significant side effects.  She has mild peripheral edema, but otherwise feels well. She has no neurologic complaints.  She denies any recent fevers or illnesses.  She has a good appetite and denies weight loss.  She has no chest pain, shortness of breath, cough, or hemoptysis.  She denies any nausea, vomiting, constipation, or diarrhea.  She has no urinary complaints.  Patient offers no further specific complaints today.  REVIEW OF SYSTEMS:   Review of Systems  Constitutional: Negative.  Negative for fever, malaise/fatigue and weight loss.  Respiratory: Negative.  Negative for cough, hemoptysis and shortness of breath.   Cardiovascular:  Positive for leg swelling. Negative for chest pain.  Gastrointestinal: Negative.  Negative for abdominal pain, blood in stool and melena.  Genitourinary: Negative.  Negative for dysuria.  Musculoskeletal: Negative.  Negative for back pain.  Skin: Negative.  Negative for rash.  Neurological: Negative.  Negative for dizziness, focal weakness, weakness and headaches.  Psychiatric/Behavioral: Negative.  The patient is not nervous/anxious.     As per HPI. Otherwise, a complete review of systems is negative.  PAST MEDICAL HISTORY: Past Medical History:  Diagnosis Date   Adenomatous colon polyp    Aortic stenosis    Arthritis    Asthma    Claudication    COPD (chronic obstructive pulmonary disease)     Depression    Diabetes mellitus without complication    Esophageal dysphagia    GERD (gastroesophageal reflux disease)    Hyperlipemia    Hypertension    Obesity    Severe obesity (BMI 35.0-35.9 with comorbidity)     PAST SURGICAL HISTORY: Past Surgical History:  Procedure Laterality Date   BLADDER SURGERY     COLONOSCOPY WITH PROPOFOL N/A 08/05/2015   Procedure: COLONOSCOPY WITH PROPOFOL;  Surgeon: Josefine Class, MD;  Location[facility] ENDOSCOPY;  Service: Endoscopy;  Laterality: N/A;   ESOPHAGOGASTRODUODENOSCOPY N/A 01/09/2022   Procedure: ESOPHAGOGASTRODUODENOSCOPY (EGD);  Surgeon: [name], MD;  Location: [facility] ENDOSCOPY;  Service: Endoscopy;  Laterality: N/A;   FOOT SURGERY Left    FRACTURE SURGERY     IR CV LINE INJECTION  06/22/2022   IR IMAGING GUIDED PORT INSERTION  06/05/2022   IR PORT REPAIR CENTRAL VENOUS ACCESS DEVICE  07/02/2022   ORIF ANKLE FRACTURE Left 03/26/2017   Procedure: OPEN REDUCTION INTERNAL FIXATION (ORIF) ANKLE FRACTURE;  Surgeon: Corky Mull, MD;  Location: [facility] ORS;  Service: Orthopedics;  Laterality: Left;    FAMILY HISTORY: Family History  Problem Relation Age of Onset   CVA Mother    Diabetes Mother    CAD Father    Breast cancer Neg Hx     ADVANCED DIRECTIVES (Y/N):  N  HEALTH MAINTENANCE: Social History   Tobacco Use   Smoking status: Former    Packs/day: 1.00    Years: 40.00    Additional pack years: 0.00    Total pack years: 40.00    Types: Cigarettes   Smokeless tobacco: Never  Vaping Use   Vaping  Use: Never used  Substance Use Topics   Alcohol use: No   Drug use: No     Colonoscopy:  PAP:  Bone density:  Lipid panel:  No Known Allergies  Current Outpatient Medications  Medication Sig Dispense Refill   albuterol (PROVENTIL HFA;VENTOLIN HFA) 108 (90 Base) MCG/ACT inhaler Inhale 2 puffs into the lungs every 6 (six) hours as needed for wheezing or shortness of breath.     amLODipine (NORVASC) 10 MG tablet  Take 10 mg by mouth daily.     atorvastatin (LIPITOR) 80 MG tablet Take 80 mg by mouth daily.     buPROPion (WELLBUTRIN XL) 300 MG 24 hr tablet Take 300 mg by mouth daily.     cholecalciferol (VITAMIN D) 400 units TABS tablet Take 400 Units by mouth daily.     citalopram (CELEXA) 20 MG tablet Take 20 mg by mouth daily.     cloNIDine (CATAPRES) 0.2 MG tablet Take 0.2 mg by mouth 2 (two) times daily.     insulin aspart (NOVOLOG) 100 UNIT/ML injection Inject 13 Units into the skin 3 (three) times daily before meals.     insulin detemir (LEVEMIR FLEXPEN) 100 UNIT/ML FlexPen Inject 25 units in the morning. Inject 23 units in the evening.     insulin glargine (LANTUS) 100 UNIT/ML injection Inject 25 Units into the skin 2 (two) times daily.     lidocaine-prilocaine (EMLA) cream Apply to affected area once 30 g 3   losartan-hydrochlorothiazide (HYZAAR) 100-25 MG tablet Take 1 tablet by mouth daily.     metoprolol succinate (TOPROL-XL) 50 MG 24 hr tablet Take 1 tablet by mouth daily.     montelukast (SINGULAIR) 10 MG tablet Take 10 mg by mouth at bedtime.     ondansetron (ZOFRAN) 8 MG tablet Take by mouth.     pantoprazole (PROTONIX) 20 MG tablet Take 20 mg by mouth daily.     prochlorperazine (COMPAZINE) 10 MG tablet Take 1 tablet (10 mg total) by mouth every 6 (six) hours as needed for nausea or vomiting. 60 tablet 2   No current facility-administered medications for this visit.    OBJECTIVE: Vitals:   08/01/22 0900  BP: 126/62  Pulse: (!) 54  Resp: 18  Temp: 97.9 F (36.6 C)  SpO2: 97%     Body mass index is 38.62 kg/m.    ECOG FS:0 - Asymptomatic  General: Well-developed, well-nourished, no acute distress. Eyes: Pink conjunctiva, anicteric sclera. HEENT: Normocephalic, moist mucous membranes. Lungs: No audible wheezing or coughing. Heart: Regular rate and rhythm. Abdomen: Soft, nontender, no obvious distention. Musculoskeletal: No edema, cyanosis, or clubbing. Neuro: Alert,  answering all questions appropriately. Cranial nerves grossly intact. Skin: No rashes or petechiae noted. Psych: Normal affect.   LAB RESULTS:  Lab Results  Component Value Date   NA 137 08/01/2022   K 4.2 08/01/2022   CL 102 08/01/2022   CO2 27 08/01/2022   GLUCOSE 179 (H) 08/01/2022   BUN 21 08/01/2022   CREATININE 0.93 08/01/2022   CALCIUM 8.4 (L) 08/01/2022   PROT 5.8 (L) 08/01/2022   ALBUMIN 3.4 (L) 08/01/2022   AST 15 08/01/2022   ALT 16 08/01/2022   ALKPHOS 83 08/01/2022   BILITOT 0.4 08/01/2022   GFRNONAA >60 08/01/2022   GFRAA >60 03/27/2017    Lab Results  Component Value Date   WBC 5.9 08/01/2022   NEUTROABS 4.8 08/01/2022   HGB 9.9 (L) 08/01/2022   HCT 30.7 (L) 08/01/2022   MCV 85.0 08/01/2022  PLT 100 (L) 08/01/2022     STUDIES: No results found.  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  Patient was seen by GYN-Onc recently who did not think that surgery was a viable option at this point, plus patient is currently refusing surgical intervention.  CT scan results from May 15, 2022 reviewed independently with residual malignancy.  The enlarged lymph nodes seen on imaging are not hypermetabolic.  Patient agreed to receive 3 cycles of carboplatinum, Taxol, and Keytruda every 3 weeks with Udenyca support followed by repeat PET scan and further evaluation.  Patient may require up to 6 cycles of treatment.  Proceed with cycle 3 of treatment today.  Return to clinic in 3 weeks for further evaluation and consideration of cycle 4.  Patient will have PET scan prior to her next treatment.  She also will have consultation with gynecology oncology at her next clinic visit.   Port: Port revision successful.  Proceed with  treatment as above. Anemia: Hemoglobin has trended down slightly to 9.9.  Monitor.   Thrombocytopenia: Platelets are 100 today.  Proceed with treatment as above.   Hypertension: Patient's blood pressure is within normal limits today.  Monitor.   Patient expressed understanding and was in agreement with this plan. She also understands that She can call clinic at any time with any questions, concerns, or complaints.    Cancer Staging  Endometrial cancer Staging form: Corpus Uteri - Carcinoma and Carcinosarcoma, AJCC 8th Edition - Clinical stage from 05/30/2022: FIGO Stage IIIB (cT3b, cN0, cM0) - Signed by Lloyd Huger, MD on 05/30/2022 Stage prefix: Initial diagnosis   Lloyd Huger, MD   08/02/2022 6:48 AM

## 2022-08-01 NOTE — Telephone Encounter (Signed)
Unable to reach pt on number in system to notify her of scheduled Terri [name] and PET scan. Mail Reminder has been sent

## 2022-08-01 NOTE — Patient Instructions (Signed)
[facility]  Discharge Instructions: Thank you for choosing [facility] to provide your oncology and hematology care.  If you have a lab appointment with the Cancer Center, please go directly to the Cancer Center and check in at the registration area.  Wear comfortable clothing and clothing appropriate for easy access to any Portacath or PICC line.   We strive to give you quality time with your provider. You may need to reschedule your appointment if you arrive late (15 or more minutes).  Arriving late affects you and other patients whose appointments are after yours.  Also, if you miss three or more appointments without notifying the office, you may be dismissed from the clinic at the provider's discretion.      For prescription refill requests, have your pharmacy contact our office and allow 72 hours for refills to be completed.    Today you received the following chemotherapy and/or immunotherapy agents Keytruda, Taxol and Carboplatin       To help prevent nausea and vomiting after your treatment, we encourage you to take your nausea medication as directed.  BELOW ARE SYMPTOMS THAT SHOULD BE REPORTED IMMEDIATELY: *FEVER GREATER THAN 100.4 F (38 C) OR HIGHER *CHILLS OR SWEATING *NAUSEA AND VOMITING THAT IS NOT CONTROLLED WITH YOUR NAUSEA MEDICATION *UNUSUAL SHORTNESS OF BREATH *UNUSUAL BRUISING OR BLEEDING *URINARY PROBLEMS (pain or burning when urinating, or frequent urination) *BOWEL PROBLEMS (unusual diarrhea, constipation, pain near the anus) TENDERNESS IN MOUTH AND THROAT WITH OR WITHOUT PRESENCE OF ULCERS (sore throat, sores in mouth, or a toothache) UNUSUAL RASH, SWELLING OR PAIN  UNUSUAL VAGINAL DISCHARGE OR ITCHING   Items with * indicate a potential emergency and should be followed up as soon as possible or go to the Emergency Department if any problems should occur.  Please show the CHEMOTHERAPY ALERT CARD or IMMUNOTHERAPY  ALERT CARD at check-in to the Emergency Department and triage nurse.  Should you have questions after your visit or need to cancel or reschedule your appointment, please contact [facility]  [phone] and follow the prompts.  Office hours are 8:00 a.m. to 4:30 p.m. Monday - Friday. Please note that voicemails left after 4:00 p.m. may not be returned until the following business day.  We are closed weekends and major holidays. You have access to a nurse at all times for urgent questions. Please call the main number to the clinic [phone] and follow the prompts.  For any non-urgent questions, you may also contact your provider using MyChart. We now offer e-Visits for anyone 18 and older to request care online for non-urgent symptoms. For details visit [email or web address].   Also download the MyChart app! Go to the app store, search "MyChart", open the app, select [facility], and log in with your MyChart username and password.    

## 2022-08-02 ENCOUNTER — Encounter: Payer: Self-pay | Admitting: Oncology

## 2022-08-02 ENCOUNTER — Other Ambulatory Visit: Payer: Self-pay

## 2022-08-02 LAB — T4: T4, Total: 7.8 ug/dL (ref 4.5–12.0)

## 2022-08-03 ENCOUNTER — Inpatient Hospital Stay: Payer: PPO

## 2022-08-03 DIAGNOSIS — Z5112 Encounter for antineoplastic immunotherapy: Secondary | ICD-10-CM | POA: Diagnosis not present

## 2022-08-03 DIAGNOSIS — C541 Malignant neoplasm of endometrium: Secondary | ICD-10-CM

## 2022-08-03 MED ORDER — PEGFILGRASTIM-CBQV 6 MG/0.6ML ~~LOC~~ SOSY
6.0000 mg | PREFILLED_SYRINGE | Freq: Once | SUBCUTANEOUS | Status: AC
  Administered 2022-08-03: 6 mg via SUBCUTANEOUS
  Filled 2022-08-03: qty 0.6

## 2022-08-08 ENCOUNTER — Ambulatory Visit
Admission: RE | Admit: 2022-08-08 | Discharge: 2022-08-08 | Disposition: A | Discharge status: Home / Self Care | Payer: PPO | Source: Ambulatory Visit | Attending: Oncology | Admitting: Oncology

## 2022-08-08 ENCOUNTER — Inpatient Hospital Stay: Payer: PPO

## 2022-08-08 DIAGNOSIS — C541 Malignant neoplasm of endometrium: Secondary | ICD-10-CM

## 2022-08-10 ENCOUNTER — Encounter
Admission: RE | Admit: 2022-08-10 | Discharge: 2022-08-10 | Disposition: A | Discharge status: Home / Self Care | Payer: PPO | Source: Ambulatory Visit | Attending: Oncology | Admitting: Oncology

## 2022-08-10 ENCOUNTER — Inpatient Hospital Stay: Payer: PPO

## 2022-08-10 DIAGNOSIS — C541 Malignant neoplasm of endometrium: Secondary | ICD-10-CM | POA: Diagnosis not present

## 2022-08-10 DIAGNOSIS — C539 Malignant neoplasm of cervix uteri, unspecified: Secondary | ICD-10-CM | POA: Diagnosis not present

## 2022-08-10 LAB — GLUCOSE, CAPILLARY: Glucose-Capillary: 167 mg/dL — ABNORMAL HIGH (ref 70–99)

## 2022-08-10 MED ORDER — FLUDEOXYGLUCOSE F - 18 (FDG) INJECTION
10.6000 | Freq: Once | INTRAVENOUS | Status: AC | PRN
  Administered 2022-08-10: 11.4 via INTRAVENOUS

## 2022-08-15 ENCOUNTER — Inpatient Hospital Stay: Payer: PPO

## 2022-08-15 ENCOUNTER — Inpatient Hospital Stay (HOSPITAL_BASED_OUTPATIENT_CLINIC_OR_DEPARTMENT_OTHER): Payer: PPO | Admitting: Obstetrics and Gynecology

## 2022-08-15 ENCOUNTER — Other Ambulatory Visit: Payer: Self-pay

## 2022-08-15 VITALS — BP 171/61 | HR 65 | Temp 96.9°F | Wt 203.6 lb

## 2022-08-15 DIAGNOSIS — Z5112 Encounter for antineoplastic immunotherapy: Secondary | ICD-10-CM | POA: Diagnosis not present

## 2022-08-15 DIAGNOSIS — C541 Malignant neoplasm of endometrium: Secondary | ICD-10-CM

## 2022-08-15 NOTE — Progress Notes (Signed)
Gynecologic Oncology Consult Visit   Referring Provider: Dr. 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 female who is seen in consultation from Dr. Jean Rosenthal for locally advanced endometrial cancer, now s/p radiation, who returns to clinic for discussion of imaging results and further management.   After pelvic radiation for extensive vaginal involvement she received three cycles of carboplatin/taxol/keytruda and still having some vaginal discharge.   CT/PET 4/24 shows response in the uterus.  See below. FINDINGS: CHEST: No hypermetabolic mediastinal or hilar nodes. No suspicious pulmonary nodules on the CT scan.   Incidental CT findings: Aortic atherosclerosis and coronary artery calcifications.   ABDOMEN/PELVIS: No abnormal tracer uptake identified within the liver, pancreas, spleen, or adrenal glands. Index left common iliac lymph node measures 1 cm with SUV max of 2.04. Previously this measured 1.0 cm with SUV max of 1.2. Previous right common iliac node measures 7 mm with SUV max of 1.99. Previously this measured 8 mm with SUV max of 1.02. Interval decrease in diffusely increased radiotracer uptake throughout the uterus. SUV max on the current exam is equal to 4.22 versus 21.8 previously.   Incidental CT findings: Small hiatal hernia. Aortic atherosclerosis.   SKELETON: No focal hypermetabolic activity to suggest skeletal metastasis.   Incidental CT findings: Diffusely increased bone marrow activity is new compared with the previous exam and likely reflects treatment related changes.   IMPRESSION: 1. Interval decrease in radiotracer uptake associated with the uterus compatible with response to therapy. SUV max is currently equal to 4.22, compared with 21.8 previously. 2. No significant change in size of bilateral common iliac lymph nodes with mild, nonspecific FDG uptake. 3.  No new sites of disease identified. 4. Diffusely increased bone marrow activity is new compared with the previous exam and likely reflects treatment related changes.  Gyn Oncology History She was referred by Dr. Judithann Sheen to Dr. Jean Rosenthal for reports of postmenopausal bleeding for 6 months, may be more.  Scant.  Became heavier. Additionally has urinary incontinence.    On exam, friable cervical vs vaginal mass was partially visualized though exam was limited. Biopsy was performed.   Cervical Biopsy: poorly differentiated carcinoma, favor endometrial origin. Positive for p53 and ER. Focal staining for p16, negative for napsin and p63. HER2- 1+ negative. MSI/MMR- Stable  Colonoscopy was scheduled but prep inadequate.    PET/CT EXAM: 10/23 Marked diffuse hypermetabolic activity throughout the entire uterus and cervix, consistent with malignancy. This favors endometrial carcinoma over cervical carcinoma. Borderline enlarged bilateral common iliac lymph nodes noted, but without FDG uptake. No definite evidence of metastatic disease by PET.  Patient seen 01/24/22 and found to have extensive cervical and upper vaginal involvement.  Only having light bleeding.  Decision made to treat her with primary radiation therapy because of extensive involvement of cervix and adjacent vagina, and she was reluctant to consider chemotherapy.  She received primary pelvic external radiation 50 Gy, completed 04/18/22.   05/15/22- CT Chest abdomen Pelvis w contrast IMPRESSION: 1. Central uterine hypoattenuation likely represents residual endometrial primary. 2. Borderline sized common iliac nodes are similar to the prior PET and were not hypermetabolic on that exam. Favored to be reactive. Otherwise, no evidence of metastatic disease in the abdomen or pelvis. 3. Scattered tiny pulmonary nodules are subpleural predominant and favored to represent subpleural lymph nodes. These can be re-evaluated at follow-up. 4.  Tiny  hiatal hernia. 5. Interval nonacute anterior  left third and fourth rib fractures. 6. Aortic atherosclerosis (ICD10-I70.0) and emphysema (ICD10-J43.9).  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   Hyperlipemia, mixed 04/05/2015   HTN, goal below 140/80 04/05/2015   Obesity (BMI 30-39.9) 04/05/2015   Type 2 diabetes mellitus with complication, with long-term current use of insulin (HCC) 04/05/2015   Class 2 severe obesity due to excess calories with serious comorbidity in adult (HCC) 08/19/2013   Obesity, Class II, BMI 35-39.9, with comorbidity 08/19/2013   Chronic obstructive pulmonary disease, unspecified (HCC) 07/31/2012   Claudication (HCC) 03/12/2012   Tobacco use 03/12/2012   Hyperlipidemia associated with type 2 diabetes mellitus (HCC) 06/06/2011   Hypertension associated with diabetes (HCC) 06/06/2011   Major depression 06/06/2011   Type 2 diabetes mellitus without complication (HCC) 06/06/2011    Past Medical History: Past Medical History:  Diagnosis Date   Adenomatous colon polyp    Aortic stenosis    Arthritis    Asthma    Claudication (HCC)    COPD (chronic obstructive pulmonary disease) (HCC)    Depression    Diabetes mellitus without complication (HCC)    Esophageal dysphagia    GERD (gastroesophageal reflux disease)    Hyperlipemia    Hypertension    Obesity    Severe obesity (BMI 35.0-35.9 with comorbidity) (HCC)     Past Surgical History: Past Surgical History:  Procedure Laterality Date   BLADDER SURGERY     COLONOSCOPY WITH PROPOFOL N/A 08/05/2015   Procedure: COLONOSCOPY WITH PROPOFOL;  Surgeon: Elnita Maxwell, MD;  Location: [facility] ENDOSCOPY;  Service:  Endoscopy;  Laterality: N/A;   ESOPHAGOGASTRODUODENOSCOPY N/A 01/09/2022   Procedure: ESOPHAGOGASTRODUODENOSCOPY (EGD);  Surgeon: Regis Bill, MD;  Location[facility] ENDOSCOPY;  Service: Endoscopy;  Laterality: N/A;   FOOT SURGERY Left    FRACTURE SURGERY     ORIF ANKLE FRACTURE Left 03/26/2017   Procedure: OPEN REDUCTION INTERNAL FIXATION (ORIF) ANKLE FRACTURE;  Surgeon: Christena Flake, MD;  Location: [facility] ORS;  Service: Orthopedics;  Laterality: Left;    OB History:  G17P0 OB History  No obstetric history on file.    Family History: Family History  Problem Relation Age of Onset   CVA Mother    Diabetes Mother    CAD Father    Breast cancer Neg Hx     Social History: Social History   Socioeconomic History   Marital status: Single    Spouse name: Not on file   Number of children: Not on file   Years of education: Not on file   Highest education level: Not on file  Occupational History   Not on file  Tobacco Use   Smoking status: Former    Packs/day: 1.00    Years: 40.00    Total pack years: 40.00    Types: Cigarettes   Smokeless tobacco: Never  Vaping Use   Vaping Use: Never used  Substance and Sexual Activity   Alcohol use: No   Drug use: No   Sexual activity: Not on file  Other Topics Concern   Not on file  Social History Narrative   Not on file   Social Determinants of Health   Financial Resource Strain: Not on file  Food Insecurity: Not on file  Transportation Needs: Unmet Transportation Needs (04/18/2022)   PRAPARE - Administrator, Civil Service (Medical): Yes    Lack of Transportation (Non-Medical): Yes  Physical Activity: Not on file  Stress: Not on file  Social Connections: Not on file  Intimate Partner Violence: Not on file    Allergies: No Known Allergies  Current Medications: Current Outpatient Medications  Medication Sig Dispense Refill   albuterol (PROVENTIL HFA;VENTOLIN HFA) 108 (90 Base) MCG/ACT inhaler Inhale 2  puffs into the lungs every 6 (six) hours as needed for wheezing or shortness of breath.     amLODipine (NORVASC) 10 MG tablet Take 10 mg by mouth daily.     atenolol (TENORMIN) 25 MG tablet Take 25 mg by mouth daily.     atorvastatin (LIPITOR) 80 MG tablet Take 80 mg by mouth daily.     buPROPion (WELLBUTRIN XL) 300 MG 24 hr tablet Take 300 mg by mouth daily.     cholecalciferol (VITAMIN D) 400 units TABS tablet Take 400 Units by mouth daily.     insulin aspart (NOVOLOG) 100 UNIT/ML injection Inject 13 Units into the skin 3 (three) times daily before meals.     insulin detemir (LEVEMIR FLEXPEN) 100 UNIT/ML FlexPen Inject 25 units in the morning. Inject 23 units in the evening.     losartan-hydrochlorothiazide (HYZAAR) 100-25 MG tablet Take 1 tablet by mouth daily.     metFORMIN (GLUCOPHAGE) 500 MG tablet Take 1,000 mg by mouth 2 (two) times daily with a meal.     montelukast (SINGULAIR) 10 MG tablet Take 10 mg by mouth at bedtime.     aspirin EC 81 MG tablet Take 81 mg by mouth daily. (Patient not taking: Reported on 05/23/2022)     enoxaparin (LOVENOX) 40 MG/0.4ML injection Inject 0.4 mLs (40 mg total) into the skin daily. (Patient not taking: Reported on 01/09/2022) 14 Syringe 0   insulin glargine (LANTUS) 100 UNIT/ML injection Inject 25 Units into the skin 2 (two) times daily. (Patient not taking: Reported on 02/21/2022)     oxyCODONE (OXY IR/ROXICODONE) 5 MG immediate release tablet Take 1 tablet (5 mg total) by mouth every 4 (four) hours as needed for moderate pain ((score 4 to 6)). (Patient not taking: Reported on 01/09/2022) 40 tablet 0   No current facility-administered medications for this visit.    Review of Systems General:  no complaints Skin: no complaints Eyes: no complaints HEENT: no complaints Breasts: no complaints Pulmonary: no complaints Cardiac: no complaints Gastrointestinal: no complaints Genitourinary/Sexual: vaginal bleeding Ob/Gyn: no complaints Musculoskeletal: no  complaints Hematology: no complaints Neurologic/Psych: depression   Objective:  Physical Examination:  Wt 202 lb 1.6 oz (91.7 kg)   BMI 36.96 kg/m     ECOG Performance Status: 1 - Symptomatic but completely ambulatory  GENERAL: Patient is a well appearing female in no acute distress HEENT:  Sclera clear. Anicteric NODES:  Negative axillary, supraclavicular, inguinal lymph node survery LUNGS:  Clear to auscultation bilaterally.   HEART:  Regular rate and rhythm.  ABDOMEN:  Soft, nontender.  No hernias, incisions well healed. No masses or ascites EXTREMITIES:  No peripheral edema. Atraumatic. No cyanosis SKIN:  Clear with no obvious rashes or skin changes.  NEURO:  Nonfocal. Well oriented.  Appropriate affect.  Pelvic exam: Chaperoned by CMA EGBUS: no lesions Vagina: narrow with just minor irregularity of the cervix. Uterus: normal size, nontender, mobile Adnexa: no palpable masses Rectovaginal: confirmatory  Lab Review Lab Results  Component Value Date   WBC 5.9 08/01/2022   HGB 9.9 (L) 08/01/2022   HCT 30.7 (L) 08/01/2022   MCV 85.0 08/01/2022   PLT 100 (L) 08/01/2022     Chemistry      Component Value Date/Time   NA 137 08/01/2022 0832   K 4.2 08/01/2022 0832   CL 102 08/01/2022 0832   CO2 27 08/01/2022 0832   BUN 21 08/01/2022 0832   CREATININE 0.93 08/01/2022 0832   CREATININE 0.82 06/27/2022 0906      Component Value Date/Time   CALCIUM 8.4 (L) 08/01/2022 0832   ALKPHOS 83 08/01/2022 0832   AST 15 08/01/2022 0832   AST 17 06/27/2022 0906   ALT 16 08/01/2022 0832   ALT 19 06/27/2022 0906   BILITOT 0.4 08/01/2022 0832   BILITOT 0.3 06/27/2022 0906        Assessment:  Nayelly Laughman is a 72 y.o. 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  PET scan shows involvement of entire uterus and cervix.  No distant disease. Bilateral common iliac nodes are borderline enlarged, but not PET positive.    Poorly differentiated cancer with TP53 overexpression and negative HPV test so unlikely to be cervical primary. She was hesitant for chemotherapy and elected for radiation in view of her locally advanced disease.  Completed radiation 04/18/22 and on exam today still has fleshy cancer involving upper vagina and flush cervix.  CT scan continues to show hypoattenuation of uterus cw persistent disease, but no evidence of distant disease.  Discussed that she was not a surgical candidate due to extensive upper vaginal involvement with cancer and decision made to start systemic therapy.  She has received three cycles of carboplatin/taxol/keytruda and PET/CT 4/24 shows considerable decrease SUV in the uterus and no distant disease.  On exam today the vaginal disease has regressed dramatically.   Cancer is TP53 mutated, MSS and HER2 negative.   Medical co-morbidities complicating care: obesity, COPD, aortic stenosis, PVD.  Plan:   Problem List Items Addressed This Visit       Genitourinary   Endometrial cancer (HCC) - Primary    Recommend an additional 3 cycles of chemo/immunotherapy with Dr. Orlie Dakin and then repeat imaging.  At that time we can consider the option of completion surgery to remove the uterus and ovaries.    The patient's diagnosis, an outline of the further diagnostic and laboratory studies which will be required, the recommendation for surgery, and alternatives were discussed with her and her accompanying family members.  All questions were answered to their satisfaction.  I personally interviewed and examined the patient. Agreed with the above/below plan of care. I have directly contributed to assessment and plan of care of this patient and educated and discussed with patient and family.  Leida Lauth, MD  CC:  Judithann Sheen Duane Lope, MD [address] [facility] [place],  [place] [address] [phone]

## 2022-08-17 ENCOUNTER — Other Ambulatory Visit: Payer: Self-pay

## 2022-08-21 MED FILL — Fosaprepitant Dimeglumine For IV Infusion 150 MG (Base Eq): INTRAVENOUS | Qty: 5 | Status: AC

## 2022-08-21 MED FILL — Dexamethasone Sodium Phosphate Inj 100 MG/10ML: INTRAMUSCULAR | Qty: 1 | Status: AC

## 2022-08-22 ENCOUNTER — Inpatient Hospital Stay (HOSPITAL_BASED_OUTPATIENT_CLINIC_OR_DEPARTMENT_OTHER): Payer: PPO | Admitting: Oncology

## 2022-08-22 ENCOUNTER — Inpatient Hospital Stay: Payer: PPO

## 2022-08-22 ENCOUNTER — Encounter: Payer: Self-pay | Admitting: Oncology

## 2022-08-22 VITALS — BP 153/46 | HR 54 | Temp 96.6°F | Resp 16 | Ht 61.0 in | Wt 202.0 lb

## 2022-08-22 DIAGNOSIS — C541 Malignant neoplasm of endometrium: Secondary | ICD-10-CM

## 2022-08-22 DIAGNOSIS — Z5112 Encounter for antineoplastic immunotherapy: Secondary | ICD-10-CM | POA: Diagnosis not present

## 2022-08-22 LAB — COMPREHENSIVE METABOLIC PANEL
ALT: 15 U/L (ref 0–44)
AST: 19 U/L (ref 15–41)
Albumin: 3.4 g/dL — ABNORMAL LOW (ref 3.5–5.0)
Alkaline Phosphatase: 82 U/L (ref 38–126)
Anion gap: 8 (ref 5–15)
BUN: 21 mg/dL (ref 8–23)
CO2: 26 mmol/L (ref 22–32)
Calcium: 8.4 mg/dL — ABNORMAL LOW (ref 8.9–10.3)
Chloride: 103 mmol/L (ref 98–111)
Creatinine, Ser: 1.07 mg/dL — ABNORMAL HIGH (ref 0.44–1.00)
GFR, Estimated: 55 mL/min — ABNORMAL LOW (ref >60)
Glucose, Bld: 196 mg/dL — ABNORMAL HIGH (ref 70–99)
Potassium: 4 mmol/L (ref 3.5–5.1)
Sodium: 137 mmol/L (ref 135–145)
Total Bilirubin: 0.5 mg/dL (ref 0.3–1.2)
Total Protein: 5.9 g/dL — ABNORMAL LOW (ref 6.5–8.1)

## 2022-08-22 LAB — CBC WITH DIFFERENTIAL/PLATELET
Abs Immature Granulocytes: 0.02 10*3/uL (ref 0.00–0.07)
Basophils Absolute: 0 10*3/uL (ref 0.0–0.1)
Basophils Relative: 1 %
Eosinophils Absolute: 0 10*3/uL (ref 0.0–0.5)
Eosinophils Relative: 0 %
HCT: 29.7 % — ABNORMAL LOW (ref 36.0–46.0)
Hemoglobin: 9.6 g/dL — ABNORMAL LOW (ref 12.0–15.0)
Immature Granulocytes: 0 %
Lymphocytes Relative: 7 %
Lymphs Abs: 0.3 10*3/uL — ABNORMAL LOW (ref 0.7–4.0)
MCH: 28.2 pg (ref 26.0–34.0)
MCHC: 32.3 g/dL (ref 30.0–36.0)
MCV: 87.1 fL (ref 80.0–100.0)
Monocytes Absolute: 0.5 10*3/uL (ref 0.1–1.0)
Monocytes Relative: 11 %
Neutro Abs: 4.1 10*3/uL (ref 1.7–7.7)
Neutrophils Relative %: 81 %
Platelets: 117 10*3/uL — ABNORMAL LOW (ref 150–400)
RBC: 3.41 MIL/uL — ABNORMAL LOW (ref 3.87–5.11)
RDW: 19 % — ABNORMAL HIGH (ref 11.5–15.5)
WBC: 5.1 10*3/uL (ref 4.0–10.5)
nRBC: 0 % (ref 0.0–0.2)

## 2022-08-22 MED ORDER — SODIUM CHLORIDE 0.9 % IV SOLN
468.0000 mg | Freq: Once | INTRAVENOUS | Status: AC
  Administered 2022-08-22: 470 mg via INTRAVENOUS
  Filled 2022-08-22: qty 47

## 2022-08-22 MED ORDER — SODIUM CHLORIDE 0.9 % IV SOLN
150.0000 mg | Freq: Once | INTRAVENOUS | Status: AC
  Administered 2022-08-22: 150 mg via INTRAVENOUS
  Filled 2022-08-22: qty 150

## 2022-08-22 MED ORDER — SODIUM CHLORIDE 0.9 % IV SOLN
Freq: Once | INTRAVENOUS | Status: AC
  Filled 2022-08-22: qty 250

## 2022-08-22 MED ORDER — SODIUM CHLORIDE 0.9 % IV SOLN
10.0000 mg | Freq: Once | INTRAVENOUS | Status: AC
  Administered 2022-08-22: 10 mg via INTRAVENOUS
  Filled 2022-08-22: qty 10

## 2022-08-22 MED ORDER — DIPHENHYDRAMINE HCL 50 MG/ML IJ SOLN
25.0000 mg | Freq: Once | INTRAMUSCULAR | Status: AC
  Administered 2022-08-22: 25 mg via INTRAVENOUS
  Filled 2022-08-22: qty 1

## 2022-08-22 MED ORDER — SODIUM CHLORIDE 0.9 % IV SOLN
200.0000 mg | Freq: Once | INTRAVENOUS | Status: AC
  Administered 2022-08-22: 200 mg via INTRAVENOUS
  Filled 2022-08-22: qty 8

## 2022-08-22 MED ORDER — FAMOTIDINE IN NACL 20-0.9 MG/50ML-% IV SOLN
20.0000 mg | Freq: Once | INTRAVENOUS | Status: AC
  Administered 2022-08-22: 20 mg via INTRAVENOUS
  Filled 2022-08-22: qty 50

## 2022-08-22 MED ORDER — HEPARIN SOD (PORK) LOCK FLUSH 100 UNIT/ML IV SOLN
500.0000 [IU] | Freq: Once | INTRAVENOUS | Status: AC | PRN
  Administered 2022-08-22: 500 [IU]
  Filled 2022-08-22: qty 5

## 2022-08-22 MED ORDER — SODIUM CHLORIDE 0.9 % IV SOLN
175.0000 mg/m2 | Freq: Once | INTRAVENOUS | Status: AC
  Administered 2022-08-22: 348 mg via INTRAVENOUS
  Filled 2022-08-22: qty 58

## 2022-08-22 MED ORDER — PALONOSETRON HCL INJECTION 0.25 MG/5ML
0.2500 mg | Freq: Once | INTRAVENOUS | Status: AC
  Administered 2022-08-22: 0.25 mg via INTRAVENOUS
  Filled 2022-08-22: qty 5

## 2022-08-22 NOTE — Progress Notes (Signed)
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  INTERVAL HISTORY: Patient returns to clinic today for further evaluation, discussion of her PET scan results, and consideration of cycle 4 of carboplatinum, Taxol, and Keytruda.  She continues to tolerate her treatments well without significant side effects.  She has no neurologic complaints.  She denies any recent fevers or illnesses.  She has a good appetite and denies weight loss.  She has no chest pain, shortness of breath, cough, or hemoptysis.  She denies any nausea, vomiting, constipation, or diarrhea.  She has no urinary complaints.  Patient offers no specific complaints today.  REVIEW OF SYSTEMS:   Review of Systems  Constitutional: Negative.  Negative for fever, malaise/fatigue and weight loss.  Respiratory: Negative.  Negative for cough, hemoptysis and shortness of breath.   Cardiovascular: Negative.  Negative for chest pain and leg swelling.  Gastrointestinal: Negative.  Negative for abdominal pain, blood in stool and melena.  Genitourinary: Negative.  Negative for dysuria.  Musculoskeletal: Negative.  Negative for back pain.  Skin: Negative.  Negative for rash.  Neurological: Negative.  Negative for dizziness, focal weakness, weakness and headaches.  Psychiatric/Behavioral: Negative.  The patient is not nervous/anxious.     As per HPI. Otherwise, a complete review of systems is negative.  PAST MEDICAL HISTORY: Past Medical History:  Diagnosis Date   Adenomatous colon polyp    Aortic stenosis    Arthritis    Asthma    Claudication    COPD (chronic obstructive pulmonary disease)    Depression    Diabetes mellitus  without complication    Esophageal dysphagia    GERD (gastroesophageal reflux disease)    Hyperlipemia    Hypertension    Obesity    Severe obesity (BMI 35.0-35.9 with comorbidity)     PAST SURGICAL HISTORY: Past Surgical History:  Procedure Laterality Date   BLADDER SURGERY     COLONOSCOPY WITH PROPOFOL N/A 08/05/2015   Procedure: COLONOSCOPY WITH PROPOFOL;  Surgeon: Elnita Maxwell, MD;  Location: [facility] ENDOSCOPY;  Service: Endoscopy;  Laterality: N/A;   ESOPHAGOGASTRODUODENOSCOPY N/A 01/09/2022   Procedure: ESOPHAGOGASTRODUODENOSCOPY (EGD);  Surgeon: Regis Bill, MD;  Location: [facility] ENDOSCOPY;  Service: Endoscopy;  Laterality: N/A;   FOOT SURGERY Left    FRACTURE SURGERY     IR CV LINE INJECTION  06/22/2022   IR IMAGING GUIDED PORT INSERTION  06/05/2022   IR PORT REPAIR CENTRAL VENOUS ACCESS DEVICE  07/02/2022   ORIF ANKLE FRACTURE Left 03/26/2017   Procedure: OPEN REDUCTION INTERNAL FIXATION (ORIF) ANKLE FRACTURE;  Surgeon: Christena Flake, MD;  Location: [facility] ORS;  Service: Orthopedics;  Laterality: Left;    FAMILY HISTORY: Family History  Problem Relation Age of Onset   CVA Mother    Diabetes Mother    CAD Father    Breast cancer Neg Hx     ADVANCED DIRECTIVES (Y/N):  N  HEALTH MAINTENANCE: Social History   Tobacco Use   Smoking status: Former    Packs/day: 1.00    Years: 40.00    Additional pack years: 0.00    Total pack years: 40.00    Types: Cigarettes   Smokeless tobacco: Never  Vaping Use   Vaping Use: Never used  Substance Use Topics   Alcohol use: No   Drug use: No     Colonoscopy:  PAP:  Bone density:  Lipid panel:  No Known Allergies  Current Outpatient Medications  Medication Sig Dispense Refill   albuterol (PROVENTIL HFA;VENTOLIN HFA) 108 (90 Base) MCG/ACT inhaler Inhale 2 puffs into the lungs every 6 (six) hours as needed for wheezing or shortness of breath.     amLODipine (NORVASC) 10 MG tablet Take 10 mg by mouth daily.      atorvastatin (LIPITOR) 80 MG tablet Take 80 mg by mouth daily.     buPROPion (WELLBUTRIN XL) 300 MG 24 hr tablet Take 300 mg by mouth daily.     cholecalciferol (VITAMIN D) 400 units TABS tablet Take 400 Units by mouth daily.     citalopram (CELEXA) 20 MG tablet Take 20 mg by mouth daily.     cloNIDine (CATAPRES) 0.2 MG tablet Take 0.2 mg by mouth 2 (two) times daily.     insulin aspart (NOVOLOG) 100 UNIT/ML injection Inject 13 Units into the skin 3 (three) times daily before meals.     insulin detemir (LEVEMIR FLEXPEN) 100 UNIT/ML FlexPen Inject 25 units in the morning. Inject 23 units in the evening.     insulin glargine (LANTUS) 100 UNIT/ML injection Inject 25 Units into the skin 2 (two) times daily.     lidocaine-prilocaine (EMLA) cream Apply to affected area once 30 g 3   losartan-hydrochlorothiazide (HYZAAR) 100-25 MG tablet Take 1 tablet by mouth daily.     metoprolol succinate (TOPROL-XL) 50 MG 24 hr tablet Take 1 tablet by mouth daily.     montelukast (SINGULAIR) 10 MG tablet Take 10 mg by mouth at bedtime.     ondansetron (ZOFRAN) 8 MG tablet Take by mouth.     pantoprazole (PROTONIX) 20 MG tablet Take 20 mg by mouth daily.     prochlorperazine (COMPAZINE) 10 MG tablet Take 1 tablet (10 mg total) by mouth every 6 (six) hours as needed for nausea or vomiting. 60 tablet 2   No current facility-administered medications for this visit.   Facility-Administered Medications Ordered in Other Visits  Medication Dose Route Frequency Provider Last Rate Last Admin   CARBOplatin (PARAPLATIN) 470 mg in sodium chloride 0.9 % 250 mL chemo infusion  470 mg Intravenous Once Jeralyn Ruths, MD       famotidine (PEPCID) IVPB 20 mg premix  20 mg Intravenous Once Jeralyn Ruths, MD 200 mL/hr at 08/22/22 1014 20 mg at 08/22/22 1014   PACLitaxel (TAXOL) 348 mg in sodium chloride 0.9 % 500 mL chemo infusion (> 80mg /m2)  175 mg/m2 (Treatment Plan Recorded) Intravenous Once Jeralyn Ruths, MD        pembrolizumab [facility]) 200 mg in sodium chloride 0.9 % 50 mL chemo infusion  200 mg Intravenous Once Jeralyn Ruths, MD        OBJECTIVE: Vitals:   08/22/22 0856  BP: (!) 153/46  Pulse: (!) 54  Resp: 16  Temp: (!) 96.6 F (35.9 C)  SpO2: 98%     Body mass index is 38.17 kg/m.    ECOG FS:0 - Asymptomatic  General: Well-developed, well-nourished, no acute distress. Eyes: Pink conjunctiva, anicteric sclera. HEENT: Normocephalic, moist mucous membranes. Lungs: No audible wheezing or coughing. Heart: Regular rate and rhythm. Abdomen: Soft, nontender, no obvious distention. Musculoskeletal: No edema, cyanosis, or clubbing. Neuro: Alert, answering all questions appropriately. Cranial nerves grossly intact. Skin: No rashes or petechiae noted. Psych: Normal  affect.  LAB RESULTS:  Lab Results  Component Value Date   NA 137 08/22/2022   K 4.0 08/22/2022   CL 103 08/22/2022   CO2 26 08/22/2022   GLUCOSE 196 (H) 08/22/2022   BUN 21 08/22/2022   CREATININE 1.07 (H) 08/22/2022   CALCIUM 8.4 (L) 08/22/2022   PROT 5.9 (L) 08/22/2022   ALBUMIN 3.4 (L) 08/22/2022   AST 19 08/22/2022   ALT 15 08/22/2022   ALKPHOS 82 08/22/2022   BILITOT 0.5 08/22/2022   GFRNONAA 55 (L) 08/22/2022   GFRAA >60 03/27/2017    Lab Results  Component Value Date   WBC 5.1 08/22/2022   NEUTROABS 4.1 08/22/2022   HGB 9.6 (L) 08/22/2022   HCT 29.7 (L) 08/22/2022   MCV 87.1 08/22/2022   PLT 117 (L) 08/22/2022     STUDIES: NM PET Image Restag (PS) Skull Base To Thigh  Result Date: 08/14/2022 CLINICAL DATA:  Subsequent treatment strategy for endometrial cancer. EXAM: NUCLEAR MEDICINE PET SKULL BASE TO THIGH TECHNIQUE: 11.4 mCi F-18 FDG was injected intravenously. Full-ring PET imaging was performed from the skull base to thigh after the radiotracer. CT data was obtained and used for attenuation correction and anatomic localization. Fasting blood glucose: 167 mg/dl COMPARISON:  53/66/4403  FINDINGS: Mediastinal blood pool activity: SUV max 2.47 Liver activity: SUV max NA NECK: No hypermetabolic lymph nodes in the neck. Incidental CT findings: None. CHEST: No hypermetabolic mediastinal or hilar nodes. No suspicious pulmonary nodules on the CT scan. Incidental CT findings: Aortic atherosclerosis and coronary artery calcifications. ABDOMEN/PELVIS: No abnormal tracer uptake identified within the liver, pancreas, spleen, or adrenal glands. Index left common iliac lymph node measures 1 cm with SUV max of 2.04. Previously this measured 1.0 cm with SUV max of 1.2. Previous right common iliac node measures 7 mm with SUV max of 1.99. Previously this measured 8 mm with SUV max of 1.02. Interval decrease in diffusely increased radiotracer uptake throughout the uterus. SUV max on the current exam is equal to 4.22 versus 21.8 previously. Incidental CT findings: Small hiatal hernia. Aortic atherosclerosis. SKELETON: No focal hypermetabolic activity to suggest skeletal metastasis. Incidental CT findings: Diffusely increased bone marrow activity is new compared with the previous exam and likely reflects treatment related changes. IMPRESSION: 1. Interval decrease in radiotracer uptake associated with the uterus compatible with response to therapy. SUV max is currently equal to 4.22, compared with 21.8 previously. 2. No significant change in size of bilateral common iliac lymph nodes with mild, nonspecific FDG uptake. 3. No new sites of disease identified. 4. Diffusely increased bone marrow activity is new compared with the previous exam and likely reflects treatment related changes. 5.  Aortic Atherosclerosis (ICD10-I70.0). Electronically Signed   By: Signa Kell M.D.   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  Patient was seen by GYN-Onc recently who did not think that surgery was a viable option at this point, plus patient is currently refusing surgical intervention.  CT scan results from May 15, 2022 reviewed independently with residual malignancy.  Repeat PET scan results from August 10, 2022 reviewed independently with significant improvement of disease burden.  Patient was evaluated by surgery.  Plan is to do 3 additional cycles of chemotherapy at which  point patient has agreed to consider possible surgical debulking.  Will also consider continuing maintenance Keytruda at that time.  Proceed with cycle 4 of treatment today.  Return to clinic in 3 weeks for further evaluation and consideration of cycle 5.   Port: Port revision successful.  Proceed with treatment as above. Anemia: Hemoglobin continues to slowly trend down and is now 9.6.  Monitor. Thrombocytopenia: Chronic and unchanged.  Patient's platelet count is 117 today.  Monitor.   Hypertension: Patient's blood pressure is moderately elevated today.  Monitor.   Patient expressed understanding and was in agreement with this plan. She also understands that She can call clinic at any time with any questions, concerns, or complaints.    Cancer Staging  Endometrial cancer Staging form: Corpus Uteri - Carcinoma and Carcinosarcoma, AJCC 8th Edition - Clinical stage from 05/30/2022: FIGO Stage IIIB (cT3b, cN0, cM0) - Signed by Jeralyn Ruths, MD on 05/30/2022 Stage prefix: Initial diagnosis   Jeralyn Ruths, MD   08/22/2022 10:19 AM

## 2022-08-22 NOTE — Patient Instructions (Signed)
[facility] [facility]  Discharge Instructions: Thank you for choosing [facility] to provide your oncology and hematology care.  If you have a lab appointment with the Cancer Center, please go directly to the Cancer Center and check in at the registration area.  Wear comfortable clothing and clothing appropriate for easy access to any Portacath or PICC line.   We strive to give you quality time with your provider. You may need to reschedule your appointment if you arrive late (15 or more minutes).  Arriving late affects you and other patients whose appointments are after yours.  Also, if you miss three or more appointments without notifying the office, you may be dismissed from the clinic at the provider's discretion.      For prescription refill requests, have your pharmacy contact our office and allow 72 hours for refills to be completed.     To help prevent nausea and vomiting after your treatment, we encourage you to take your nausea medication as directed.  BELOW ARE SYMPTOMS THAT SHOULD BE REPORTED IMMEDIATELY: *FEVER GREATER THAN 100.4 F (38 C) OR HIGHER *CHILLS OR SWEATING *NAUSEA AND VOMITING THAT IS NOT CONTROLLED WITH YOUR NAUSEA MEDICATION *UNUSUAL SHORTNESS OF BREATH *UNUSUAL BRUISING OR BLEEDING *URINARY PROBLEMS (pain or burning when urinating, or frequent urination) *BOWEL PROBLEMS (unusual diarrhea, constipation, pain near the anus) TENDERNESS IN MOUTH AND THROAT WITH OR WITHOUT PRESENCE OF ULCERS (sore throat, sores in mouth, or a toothache) UNUSUAL RASH, SWELLING OR PAIN  UNUSUAL VAGINAL DISCHARGE OR ITCHING   Items with * indicate a potential emergency and should be followed up as soon as possible or go to the Emergency Department if any problems should occur.  Please show the CHEMOTHERAPY ALERT CARD or IMMUNOTHERAPY ALERT CARD at check-in to the Emergency Department and triage nurse.  Should you have questions after your visit  or need to cancel or reschedule your appointment, please contact [facility] [place] [facility]  [phone] and follow the prompts.  Office hours are 8:00 a.m. to 4:30 p.m. Monday - Friday. Please note that voicemails left after 4:00 p.m. may not be returned until the following business day.  We are closed weekends and major holidays. You have access to a nurse at all times for urgent questions. Please call the main number to the clinic [phone] and follow the prompts.  For any non-urgent questions, you may also contact your provider using MyChart. We now offer e-Visits for anyone 18 and older to request care online for non-urgent symptoms. For details visit [email or web address].   Also download the MyChart app! Go to the app store, search "MyChart", open the app, select [facility], and log in with your MyChart username and password.    

## 2022-08-24 ENCOUNTER — Inpatient Hospital Stay: Payer: PPO

## 2022-08-24 DIAGNOSIS — Z5112 Encounter for antineoplastic immunotherapy: Secondary | ICD-10-CM | POA: Diagnosis not present

## 2022-08-24 DIAGNOSIS — C541 Malignant neoplasm of endometrium: Secondary | ICD-10-CM

## 2022-08-24 MED ORDER — PEGFILGRASTIM-CBQV 6 MG/0.6ML ~~LOC~~ SOSY
6.0000 mg | PREFILLED_SYRINGE | Freq: Once | SUBCUTANEOUS | Status: AC
  Administered 2022-08-24: 6 mg via SUBCUTANEOUS
  Filled 2022-08-24: qty 0.6

## 2022-09-05 DIAGNOSIS — I10 Essential (primary) hypertension: Secondary | ICD-10-CM | POA: Diagnosis not present

## 2022-09-05 DIAGNOSIS — E782 Mixed hyperlipidemia: Secondary | ICD-10-CM | POA: Diagnosis not present

## 2022-09-05 DIAGNOSIS — E669 Obesity, unspecified: Secondary | ICD-10-CM | POA: Diagnosis not present

## 2022-09-05 DIAGNOSIS — Z794 Long term (current) use of insulin: Secondary | ICD-10-CM | POA: Diagnosis not present

## 2022-09-05 DIAGNOSIS — E118 Type 2 diabetes mellitus with unspecified complications: Secondary | ICD-10-CM | POA: Diagnosis not present

## 2022-09-05 DIAGNOSIS — I350 Nonrheumatic aortic (valve) stenosis: Secondary | ICD-10-CM | POA: Diagnosis not present

## 2022-09-05 DIAGNOSIS — Z01818 Encounter for other preprocedural examination: Secondary | ICD-10-CM | POA: Diagnosis not present

## 2022-09-05 DIAGNOSIS — J4489 Other specified chronic obstructive pulmonary disease: Secondary | ICD-10-CM | POA: Diagnosis not present

## 2022-09-11 MED FILL — Fosaprepitant Dimeglumine For IV Infusion 150 MG (Base Eq): INTRAVENOUS | Qty: 5 | Status: AC

## 2022-09-11 MED FILL — Dexamethasone Sodium Phosphate Inj 100 MG/10ML: INTRAMUSCULAR | Qty: 1 | Status: AC

## 2022-09-12 ENCOUNTER — Encounter: Payer: Self-pay | Admitting: Oncology

## 2022-09-12 ENCOUNTER — Inpatient Hospital Stay: Payer: PPO

## 2022-09-12 ENCOUNTER — Inpatient Hospital Stay (HOSPITAL_BASED_OUTPATIENT_CLINIC_OR_DEPARTMENT_OTHER): Payer: PPO | Admitting: Oncology

## 2022-09-12 ENCOUNTER — Inpatient Hospital Stay: Payer: PPO | Attending: Obstetrics and Gynecology

## 2022-09-12 ENCOUNTER — Ambulatory Visit: Payer: PPO

## 2022-09-12 VITALS — BP 132/60 | HR 56 | Temp 97.2°F | Wt 202.6 lb

## 2022-09-12 VITALS — Resp 18

## 2022-09-12 DIAGNOSIS — D6481 Anemia due to antineoplastic chemotherapy: Secondary | ICD-10-CM | POA: Insufficient documentation

## 2022-09-12 DIAGNOSIS — M79605 Pain in left leg: Secondary | ICD-10-CM | POA: Diagnosis not present

## 2022-09-12 DIAGNOSIS — C541 Malignant neoplasm of endometrium: Secondary | ICD-10-CM

## 2022-09-12 DIAGNOSIS — Z5189 Encounter for other specified aftercare: Secondary | ICD-10-CM | POA: Diagnosis not present

## 2022-09-12 DIAGNOSIS — Z5111 Encounter for antineoplastic chemotherapy: Secondary | ICD-10-CM | POA: Diagnosis not present

## 2022-09-12 DIAGNOSIS — Z5112 Encounter for antineoplastic immunotherapy: Secondary | ICD-10-CM | POA: Diagnosis not present

## 2022-09-12 DIAGNOSIS — D696 Thrombocytopenia, unspecified: Secondary | ICD-10-CM | POA: Diagnosis not present

## 2022-09-12 DIAGNOSIS — I10 Essential (primary) hypertension: Secondary | ICD-10-CM | POA: Insufficient documentation

## 2022-09-12 DIAGNOSIS — D649 Anemia, unspecified: Secondary | ICD-10-CM | POA: Insufficient documentation

## 2022-09-12 LAB — COMPREHENSIVE METABOLIC PANEL
ALT: 13 U/L (ref 0–44)
AST: 15 U/L (ref 15–41)
Albumin: 3.4 g/dL — ABNORMAL LOW (ref 3.5–5.0)
Alkaline Phosphatase: 78 U/L (ref 38–126)
Anion gap: 8 (ref 5–15)
BUN: 24 mg/dL — ABNORMAL HIGH (ref 8–23)
CO2: 26 mmol/L (ref 22–32)
Calcium: 8.4 mg/dL — ABNORMAL LOW (ref 8.9–10.3)
Chloride: 102 mmol/L (ref 98–111)
Creatinine, Ser: 0.88 mg/dL (ref 0.44–1.00)
GFR, Estimated: >60 mL/min (ref >60)
Glucose, Bld: 193 mg/dL — ABNORMAL HIGH (ref 70–99)
Potassium: 4.2 mmol/L (ref 3.5–5.1)
Sodium: 136 mmol/L (ref 135–145)
Total Bilirubin: 0.4 mg/dL (ref 0.3–1.2)
Total Protein: 5.9 g/dL — ABNORMAL LOW (ref 6.5–8.1)

## 2022-09-12 LAB — CBC WITH DIFFERENTIAL/PLATELET
Abs Immature Granulocytes: 0.02 10*3/uL (ref 0.00–0.07)
Basophils Absolute: 0 10*3/uL (ref 0.0–0.1)
Basophils Relative: 1 %
Eosinophils Absolute: 0 10*3/uL (ref 0.0–0.5)
Eosinophils Relative: 0 %
HCT: 28.9 % — ABNORMAL LOW (ref 36.0–46.0)
Hemoglobin: 9.1 g/dL — ABNORMAL LOW (ref 12.0–15.0)
Immature Granulocytes: 1 %
Lymphocytes Relative: 9 %
Lymphs Abs: 0.3 10*3/uL — ABNORMAL LOW (ref 0.7–4.0)
MCH: 28.5 pg (ref 26.0–34.0)
MCHC: 31.5 g/dL (ref 30.0–36.0)
MCV: 90.6 fL (ref 80.0–100.0)
Monocytes Absolute: 0.5 10*3/uL (ref 0.1–1.0)
Monocytes Relative: 13 %
Neutro Abs: 2.6 10*3/uL (ref 1.7–7.7)
Neutrophils Relative %: 76 %
Platelets: 115 10*3/uL — ABNORMAL LOW (ref 150–400)
RBC: 3.19 MIL/uL — ABNORMAL LOW (ref 3.87–5.11)
RDW: 18.6 % — ABNORMAL HIGH (ref 11.5–15.5)
WBC: 3.4 10*3/uL — ABNORMAL LOW (ref 4.0–10.5)
nRBC: 0 % (ref 0.0–0.2)

## 2022-09-12 MED ORDER — SODIUM CHLORIDE 0.9 % IV SOLN
175.0000 mg/m2 | Freq: Once | INTRAVENOUS | Status: AC
  Administered 2022-09-12: 348 mg via INTRAVENOUS
  Filled 2022-09-12: qty 58

## 2022-09-12 MED ORDER — SODIUM CHLORIDE 0.9 % IV SOLN
200.0000 mg | Freq: Once | INTRAVENOUS | Status: AC
  Administered 2022-09-12: 200 mg via INTRAVENOUS
  Filled 2022-09-12: qty 8

## 2022-09-12 MED ORDER — FAMOTIDINE IN NACL 20-0.9 MG/50ML-% IV SOLN
20.0000 mg | Freq: Once | INTRAVENOUS | Status: AC
  Administered 2022-09-12: 20 mg via INTRAVENOUS
  Filled 2022-09-12: qty 50

## 2022-09-12 MED ORDER — SODIUM CHLORIDE 0.9 % IV SOLN
150.0000 mg | Freq: Once | INTRAVENOUS | Status: AC
  Administered 2022-09-12: 150 mg via INTRAVENOUS
  Filled 2022-09-12: qty 150

## 2022-09-12 MED ORDER — SODIUM CHLORIDE 0.9 % IV SOLN
Freq: Once | INTRAVENOUS | Status: AC
  Filled 2022-09-12: qty 250

## 2022-09-12 MED ORDER — SODIUM CHLORIDE 0.9 % IV SOLN
492.0000 mg | Freq: Once | INTRAVENOUS | Status: AC
  Administered 2022-09-12: 490 mg via INTRAVENOUS
  Filled 2022-09-12: qty 49

## 2022-09-12 MED ORDER — SODIUM CHLORIDE 0.9% FLUSH
10.0000 mL | INTRAVENOUS | Status: DC | PRN
  Administered 2022-09-12: 10 mL
  Filled 2022-09-12: qty 10

## 2022-09-12 MED ORDER — PALONOSETRON HCL INJECTION 0.25 MG/5ML
0.2500 mg | Freq: Once | INTRAVENOUS | Status: AC
  Administered 2022-09-12: 0.25 mg via INTRAVENOUS
  Filled 2022-09-12: qty 5

## 2022-09-12 MED ORDER — DIPHENHYDRAMINE HCL 50 MG/ML IJ SOLN
25.0000 mg | Freq: Once | INTRAMUSCULAR | Status: AC
  Administered 2022-09-12: 25 mg via INTRAVENOUS
  Filled 2022-09-12: qty 1

## 2022-09-12 MED ORDER — HEPARIN SOD (PORK) LOCK FLUSH 100 UNIT/ML IV SOLN
500.0000 [IU] | Freq: Once | INTRAVENOUS | Status: AC | PRN
  Administered 2022-09-12: 500 [IU]
  Filled 2022-09-12: qty 5

## 2022-09-12 MED ORDER — SODIUM CHLORIDE 0.9 % IV SOLN
10.0000 mg | Freq: Once | INTRAVENOUS | Status: AC
  Administered 2022-09-12: 10 mg via INTRAVENOUS
  Filled 2022-09-12: qty 10

## 2022-09-12 NOTE — Progress Notes (Signed)
Pt. Is starting to have some severe leg pain.

## 2022-09-12 NOTE — Patient Instructions (Signed)
[facility] [place] [facility]  Discharge Instructions: Thank you for choosing [facility] to provide your oncology and hematology care.  If you have a lab appointment with the Cancer Center, please go directly to the Cancer Center and check in at the registration area.  Wear comfortable clothing and clothing appropriate for easy access to any Portacath or PICC line.   We strive to give you quality time with your provider. You may need to reschedule your appointment if you arrive late (15 or more minutes).  Arriving late affects you and other patients whose appointments are after yours.  Also, if you miss three or more appointments without notifying the office, you may be dismissed from the clinic at the provider's discretion.      For prescription refill requests, have your pharmacy contact our office and allow 72 hours for refills to be completed.     To help prevent nausea and vomiting after your treatment, we encourage you to take your nausea medication as directed.  BELOW ARE SYMPTOMS THAT SHOULD BE REPORTED IMMEDIATELY: *FEVER GREATER THAN 100.4 F (38 C) OR HIGHER *CHILLS OR SWEATING *NAUSEA AND VOMITING THAT IS NOT CONTROLLED WITH YOUR NAUSEA MEDICATION *UNUSUAL SHORTNESS OF BREATH *UNUSUAL BRUISING OR BLEEDING *URINARY PROBLEMS (pain or burning when urinating, or frequent urination) *BOWEL PROBLEMS (unusual diarrhea, constipation, pain near the anus) TENDERNESS IN MOUTH AND THROAT WITH OR WITHOUT PRESENCE OF ULCERS (sore throat, sores in mouth, or a toothache) UNUSUAL RASH, SWELLING OR PAIN  UNUSUAL VAGINAL DISCHARGE OR ITCHING   Items with * indicate a potential emergency and should be followed up as soon as possible or go to the Emergency Department if any problems should occur.  Please show the CHEMOTHERAPY ALERT CARD or IMMUNOTHERAPY ALERT CARD at check-in to the Emergency Department and triage nurse.  Should you have questions after your visit  or need to cancel or reschedule your appointment, please contact [facility] [place] [facility]  [phone] and follow the prompts.  Office hours are 8:00 a.m. to 4:30 p.m. Monday - Friday. Please note that voicemails left after 4:00 p.m. may not be returned until the following business day.  We are closed weekends and major holidays. You have access to a nurse at all times for urgent questions. Please call the main number to the clinic [phone] and follow the prompts.  For any non-urgent questions, you may also contact your provider using MyChart. We now offer e-Visits for anyone 18 and older to request care online for non-urgent symptoms. For details visit [email or web address].   Also download the MyChart app! Go to the app store, search "MyChart", open the app, select [facility], and log in with your MyChart username and password.    

## 2022-09-12 NOTE — Progress Notes (Signed)
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  INTERVAL HISTORY: Patient returns to clinic today for further evaluation and consideration of cycle 5 of carboplatinum, Taxol, and Keytruda.  She has claudication symptoms of her left leg, but otherwise feels well.  She continues to tolerate her treatments without significant side effects. She has no neurologic complaints.  She denies any recent fevers or illnesses.  She has a good appetite and denies weight loss.  She has no chest pain, shortness of breath, cough, or hemoptysis.  She denies any nausea, vomiting, constipation, or diarrhea.  She has no urinary complaints.  Patient offers no further specific complaints today.  REVIEW OF SYSTEMS:   Review of Systems  Constitutional: Negative.  Negative for fever, malaise/fatigue and weight loss.  Respiratory: Negative.  Negative for cough, hemoptysis and shortness of breath.   Cardiovascular: Negative.  Negative for chest pain and leg swelling.  Gastrointestinal: Negative.  Negative for abdominal pain, blood in stool and melena.  Genitourinary: Negative.  Negative for dysuria.  Musculoskeletal: Negative.  Negative for back pain.  Skin: Negative.  Negative for rash.  Neurological: Negative.  Negative for dizziness, focal weakness, weakness and headaches.  Psychiatric/Behavioral: Negative.  The patient is not nervous/anxious.     As per HPI. Otherwise, a complete review of systems is negative.  PAST MEDICAL HISTORY: Past Medical History:  Diagnosis Date   Adenomatous colon polyp    Aortic stenosis    Arthritis    Asthma    Claudication (HCC)    COPD (chronic obstructive pulmonary  disease) (HCC)    Depression    Diabetes mellitus without complication (HCC)    Esophageal dysphagia    GERD (gastroesophageal reflux disease)    Hyperlipemia    Hypertension    Obesity    Severe obesity (BMI 35.0-35.9 with comorbidity) (HCC)     PAST SURGICAL HISTORY: Past Surgical History:  Procedure Laterality Date   BLADDER SURGERY     COLONOSCOPY WITH PROPOFOL N/A 08/05/2015   Procedure: COLONOSCOPY WITH PROPOFOL;  Surgeon: Elnita Maxwell, MD;  Location: [facility] ENDOSCOPY;  Service: Endoscopy;  Laterality: N/A;   ESOPHAGOGASTRODUODENOSCOPY N/A 01/09/2022   Procedure: ESOPHAGOGASTRODUODENOSCOPY (EGD);  Surgeon: Regis Bill, MD;  Location: [facility] ENDOSCOPY;  Service: Endoscopy;  Laterality: N/A;   FOOT SURGERY Left    FRACTURE SURGERY     IR CV LINE INJECTION  06/22/2022   IR IMAGING GUIDED PORT INSERTION  06/05/2022   IR PORT REPAIR CENTRAL VENOUS ACCESS DEVICE  07/02/2022   ORIF ANKLE FRACTURE Left 03/26/2017   Procedure: OPEN REDUCTION INTERNAL FIXATION (ORIF) ANKLE FRACTURE;  Surgeon: Christena Flake, MD;  Location: [facility] ORS;  Service: Orthopedics;  Laterality: Left;    FAMILY HISTORY: Family History  Problem Relation Age of Onset   CVA Mother    Diabetes Mother    CAD Father    Breast cancer Neg Hx     ADVANCED DIRECTIVES (Y/N):  N  HEALTH MAINTENANCE: Social History   Tobacco Use   Smoking status: Former    Packs/day: 1.00    Years: 40.00    Additional pack years: 0.00    Total pack years: 40.00    Types: Cigarettes   Smokeless tobacco: Never  Vaping Use   Vaping Use: Never used  Substance Use Topics   Alcohol use: No   Drug use: No     Colonoscopy:  PAP:  Bone density:  Lipid panel:  No Known Allergies  Current Outpatient Medications  Medication Sig Dispense Refill   albuterol (PROVENTIL HFA;VENTOLIN HFA) 108 (90 Base) MCG/ACT inhaler Inhale 2 puffs into the lungs every 6 (six) hours as needed for wheezing or shortness of breath.      amLODipine (NORVASC) 10 MG tablet Take 10 mg by mouth daily.     atorvastatin (LIPITOR) 80 MG tablet Take 80 mg by mouth daily.     buPROPion (WELLBUTRIN XL) 300 MG 24 hr tablet Take 300 mg by mouth daily.     cholecalciferol (VITAMIN D) 400 units TABS tablet Take 400 Units by mouth daily.     citalopram (CELEXA) 20 MG tablet Take 20 mg by mouth daily.     cloNIDine (CATAPRES) 0.2 MG tablet Take 0.2 mg by mouth 2 (two) times daily.     glipiZIDE (GLUCOTROL XL) 10 MG 24 hr tablet Take 1 tablet by mouth daily.     insulin aspart (NOVOLOG) 100 UNIT/ML injection Inject 13 Units into the skin 3 (three) times daily before meals.     insulin detemir (LEVEMIR FLEXPEN) 100 UNIT/ML FlexPen Inject 25 units in the morning. Inject 23 units in the evening.     insulin glargine (LANTUS) 100 UNIT/ML injection Inject 25 Units into the skin 2 (two) times daily.     losartan-hydrochlorothiazide (HYZAAR) 100-25 MG tablet Take 1 tablet by mouth daily.     metoprolol succinate (TOPROL-XL) 50 MG 24 hr tablet Take 1 tablet by mouth daily.     montelukast (SINGULAIR) 10 MG tablet Take 10 mg by mouth at bedtime.     ondansetron (ZOFRAN) 8 MG tablet Take by mouth.     pantoprazole (PROTONIX) 20 MG tablet Take 20 mg by mouth daily.     prochlorperazine (COMPAZINE) 10 MG tablet Take 1 tablet (10 mg total) by mouth every 6 (six) hours as needed for nausea or vomiting. 60 tablet 2   lidocaine-prilocaine (EMLA) cream Apply to affected area once (Patient not taking: Reported on 09/12/2022) 30 g 3   No current facility-administered medications for this visit.   Facility-Administered Medications Ordered in Other Visits  Medication Dose Route Frequency Provider Last Rate Last Admin   CARBOplatin (PARAPLATIN) 490 mg in sodium chloride 0.9 % 250 mL chemo infusion  490 mg Intravenous Once Jeralyn Ruths, MD       dexamethasone (DECADRON) 10 mg in sodium chloride 0.9 % 50 mL IVPB  10 mg Intravenous Once Jeralyn Ruths, MD  204 mL/hr at 09/12/22 1033 10 mg at 09/12/22 1033   fosaprepitant (EMEND) 150 mg in sodium chloride 0.9 % 145 mL IVPB  150 mg Intravenous Once Jeralyn Ruths, MD 450 mL/hr at 09/12/22 1032 150 mg at 09/12/22 1032   heparin lock flush 100 unit/mL  500 Units Intracatheter Once PRN Jeralyn Ruths, MD       PACLitaxel (TAXOL) 348 mg in sodium chloride 0.9 % 500 mL chemo infusion (> 80mg /m2)  175 mg/m2 (Treatment Plan Recorded) Intravenous Once Jeralyn Ruths, MD       pembrolizumab [facility]) 200 mg in sodium chloride 0.9 % 50 mL chemo infusion  200 mg Intravenous Once Jeralyn Ruths, MD       sodium chloride flush (NS) 0.9 % injection 10 mL  10 mL Intracatheter PRN Jeralyn Ruths, MD        OBJECTIVE: Vitals:   09/12/22 0920  BP: 132/60  Pulse: (!) 56  Temp: (!) 97.2 F (36.2 C)  SpO2: 98%     Body mass index is 38.28 kg/m.    ECOG FS:0 - Asymptomatic  General: Well-developed, well-nourished, no acute distress. Eyes: Pink conjunctiva, anicteric sclera. HEENT: Normocephalic, moist mucous membranes. Lungs: No audible wheezing or coughing. Heart: Regular rate and rhythm. Abdomen: Soft, nontender, no obvious distention. Musculoskeletal: No edema, cyanosis, or clubbing. Neuro: Alert, answering all questions appropriately. Cranial nerves grossly intact. Skin: No rashes or petechiae noted. Psych: Normal affect.  LAB RESULTS:  Lab Results  Component Value Date   NA 136 09/12/2022   K 4.2 09/12/2022   CL 102 09/12/2022   CO2 26 09/12/2022   GLUCOSE 193 (H) 09/12/2022   BUN 24 (H) 09/12/2022   CREATININE 0.88 09/12/2022   CALCIUM 8.4 (L) 09/12/2022   PROT 5.9 (L) 09/12/2022   ALBUMIN 3.4 (L) 09/12/2022   AST 15 09/12/2022   ALT 13 09/12/2022   ALKPHOS 78 09/12/2022   BILITOT 0.4 09/12/2022   GFRNONAA >60 09/12/2022   GFRAA >60 03/27/2017    Lab Results  Component Value Date   WBC 3.4 (L) 09/12/2022   NEUTROABS 2.6 09/12/2022   HGB 9.1 (L) 09/12/2022    HCT 28.9 (L) 09/12/2022   MCV 90.6 09/12/2022   PLT 115 (L) 09/12/2022     STUDIES: No results found.  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  Patient was seen by GYN-Onc recently who did not think that surgery was a viable option at this point, plus patient is currently refusing surgical intervention.  CT scan results from May 15, 2022 reviewed independently with residual malignancy.  Repeat PET scan results from August 10, 2022 reviewed independently with significant improvement of disease burden.  Patient was evaluated by surgery.  Plan is to do 3 additional cycles of chemotherapy at which point patient has agreed to consider possible surgical debulking.  Will also consider continuing maintenance Keytruda at that time.  Proceed with cycle 5 of treatment today.  Return to clinic in 3 weeks for further evaluation and consideration of her sixth and final treatment.  Port: Port revision successful.  Proceed with treatment as above. Anemia: Chronic and unchanged.  Patient's hemoglobin is 9.1.   Thrombocytopenia: Chronic and unchanged.  Patient's platelet count is 115.   Hypertension: Patient's blood pressure is within normal limits today. Claudication symptoms: Patient was given a referral to vascular surgery.   Patient expressed understanding and was in agreement with this plan. She also understands that She can call clinic at any time with any questions, concerns, or complaints.    Cancer Staging  Endometrial cancer [facility]) Staging form: Corpus Uteri - Carcinoma and Carcinosarcoma, AJCC 8th Edition - Clinical stage from 05/30/2022: FIGO Stage IIIB (cT3b, cN0, cM0) - Signed by Jeralyn Ruths, MD on 05/30/2022 Stage prefix: Initial  diagnosis   Jeralyn Ruths, MD   09/12/2022 10:39 AM

## 2022-09-14 ENCOUNTER — Inpatient Hospital Stay: Payer: PPO

## 2022-09-14 ENCOUNTER — Encounter: Payer: Self-pay | Admitting: Licensed Clinical Social Worker

## 2022-09-14 DIAGNOSIS — Z5112 Encounter for antineoplastic immunotherapy: Secondary | ICD-10-CM | POA: Diagnosis not present

## 2022-09-14 DIAGNOSIS — C541 Malignant neoplasm of endometrium: Secondary | ICD-10-CM

## 2022-09-14 MED ORDER — PEGFILGRASTIM-CBQV 6 MG/0.6ML ~~LOC~~ SOSY
6.0000 mg | PREFILLED_SYRINGE | Freq: Once | SUBCUTANEOUS | Status: AC
  Administered 2022-09-14: 6 mg via SUBCUTANEOUS
  Filled 2022-09-14: qty 0.6

## 2022-09-14 NOTE — Progress Notes (Signed)
[facility] Clinical Social Work  Clinical Social Work was referred by medical provider for assessment of psychosocial needs.  Clinical Social Worker contacted patient by phone to offer support and assess for needs.  Patient unavailable to speak,  CSW will call patient next week.  Patient verbalized understanding.    FA   Joseph Art, LCSW  Clinical Social Worker [facility]

## 2022-09-17 ENCOUNTER — Other Ambulatory Visit: Payer: Self-pay | Admitting: *Deleted

## 2022-09-17 DIAGNOSIS — C541 Malignant neoplasm of endometrium: Secondary | ICD-10-CM

## 2022-09-20 ENCOUNTER — Inpatient Hospital Stay: Payer: PPO | Admitting: Licensed Clinical Social Worker

## 2022-09-20 NOTE — Progress Notes (Signed)
[facility] Clinical Social Work  Initial Assessment   Terri [name] is a 72 y.o. year old female contacted by phone. Clinical Social Work was referred by medical provider for assessment of psychosocial needs.   SDOH (Social Determinants of Health) assessments performed: Yes SDOH Interventions    Flowsheet Row Clinical Support from 09/20/2022 in [facility] at [facility] Visit from 09/12/2022 in [facility] at [facility] Appointment from 08/22/2022 in [facility] at [facility] Appointment from 08/15/2022 in [facility] at [facility] Appointment from 08/08/2022 in [facility] at [facility] Appointment from 08/01/2022 in [facility] at [facility]  SDOH Interventions        Transportation Interventions -- [facility] Zenaida Niece ([facility] Only) CC[facility] Zenaida Niece ([facility] Only) CC[facility] Zenaida Niece ([facility] Only) [facility] Zenaida Niece ([facility] Only) [facility] Zenaida Niece ([facility] Only)  Alcohol Usage Interventions Intervention Not Indicated (Score <7) -- -- -- -- --  Financial Strain Interventions Intervention Not Indicated, Artist -- -- -- -- --  Physical Activity Interventions Intervention Not Indicated -- -- -- -- --  Social Connections Interventions Intervention Not Indicated -- -- -- -- --       SDOH Screenings   Food Insecurity: No Food Insecurity (05/30/2022)  Housing: Low Risk  (05/30/2022)  Transportation Needs: Unmet Transportation Needs (09/12/2022)  Utilities: Not At Risk (05/30/2022)  Alcohol Screen: Low Risk  (09/20/2022)  Depression (PHQ2-9): Low Risk  (05/30/2022)  Financial Resource Strain: Low Risk  (09/20/2022)  Physical Activity: Inactive (09/20/2022)  Social Connections: Socially Isolated (09/20/2022)  Stress: Stress Concern Present (09/20/2022)  Tobacco Use: Medium Risk (09/12/2022)     Distress Screen completed: No     No data to display             Family/Social Information:  Housing Arrangement: patient lives alone, main contact friend Acey Lav [phone]  Family members/support persons in your life? Friends, Programme researcher, broadcasting/film/video, and patient has very limited social support, and is estranged from brother who is her only family member Transportation concerns: yes, using [facility] transportation assistance  Employment: Retired  .  Income source: Actor concerns:  no immediate concerns, is concerned about affordability of medical bills Type of concern: Medical bills Food access concerns: no Religious or spiritual practice: Not known Services Currently in place:  [employer] PPO   Coping/ Adjustment to diagnosis: Patient understands treatment plan and what happens next? yes Concerns about diagnosis and/or treatment: Pain or discomfort during procedures, Feelings of anger or sadness, Overwhelmed by information, Afraid of cancer, How I will pay for the services I need, and How will I care for myself Patient reported stressors:  Medical bills Hopes and/or priorities:   Patient enjoys  N/A Current coping skills/ strengths: Average or above average intelligence , Capable of independent living , Communication skills , Financial means , and Motivation for treatment/growth     SUMMARY: Current SDOH Barriers:  Financial constraints related to fixed income  Clinical Social Work Clinical Goal(s):  No clinical social work goals at this time  Interventions: Discussed common feeling and emotions when being diagnosed with cancer, and the importance of support during treatment Informed patient of the support team roles and support services at [facility] Provided CSW contact information and encouraged patient to call with any questions or concerns Referred patient to financial navigator and Research scientist (medical) and Provided patient with information about  CSW role in patient care and other  resources.   Follow Up Plan: Patient will contact CSW with any support or resource needs Patient verbalizes understanding of plan: Yes    Joseph Art, LCSW Clinical Social Worker [facility]

## 2022-09-26 DIAGNOSIS — N1831 Chronic kidney disease, stage 3a: Secondary | ICD-10-CM | POA: Diagnosis not present

## 2022-09-26 DIAGNOSIS — Z794 Long term (current) use of insulin: Secondary | ICD-10-CM | POA: Diagnosis not present

## 2022-09-26 DIAGNOSIS — I350 Nonrheumatic aortic (valve) stenosis: Secondary | ICD-10-CM | POA: Diagnosis not present

## 2022-09-26 DIAGNOSIS — I10 Essential (primary) hypertension: Secondary | ICD-10-CM | POA: Diagnosis not present

## 2022-09-26 DIAGNOSIS — E1122 Type 2 diabetes mellitus with diabetic chronic kidney disease: Secondary | ICD-10-CM | POA: Diagnosis not present

## 2022-09-26 DIAGNOSIS — E782 Mixed hyperlipidemia: Secondary | ICD-10-CM | POA: Diagnosis not present

## 2022-09-26 DIAGNOSIS — E118 Type 2 diabetes mellitus with unspecified complications: Secondary | ICD-10-CM | POA: Diagnosis not present

## 2022-09-26 DIAGNOSIS — Z79899 Other long term (current) drug therapy: Secondary | ICD-10-CM | POA: Diagnosis not present

## 2022-09-26 NOTE — Progress Notes (Signed)
Spoke with Ranezmae, she will be in 09/27/2022 to sign the paperwork for the [facility] and bring in proof of income.

## 2022-09-27 ENCOUNTER — Ambulatory Visit
Admission: RE | Admit: 2022-09-27 | Discharge: 2022-09-27 | Disposition: A | Discharge status: Home / Self Care | Payer: PPO | Source: Ambulatory Visit | Attending: Internal Medicine | Admitting: Internal Medicine

## 2022-09-27 ENCOUNTER — Other Ambulatory Visit: Payer: Self-pay | Admitting: *Deleted

## 2022-09-27 ENCOUNTER — Telehealth: Payer: Self-pay | Admitting: *Deleted

## 2022-09-27 DIAGNOSIS — R0602 Shortness of breath: Secondary | ICD-10-CM

## 2022-09-27 DIAGNOSIS — Z1231 Encounter for screening mammogram for malignant neoplasm of breast: Secondary | ICD-10-CM | POA: Insufficient documentation

## 2022-09-27 NOTE — Telephone Encounter (Signed)
Patient called with evident shortness of breath on phone. She reports that she saw her PCP yesterday for shortness of breath and was told it is Asthma and she was restarted on Advair. She is currently on chemotherapy treatment with out office and is also heard to be wheezing whilst on phone with me. Her next appointment is 6/5 for lab/ doctor/ chemotherapy with Keytruda, Taxol, Carbo. Please advise

## 2022-09-27 NOTE — Progress Notes (Signed)
Patient will need a stat chest xray tomorrow. Terri [name] transportation- port lab/ smc- Lauren's sch / +- fluid clinic apt.

## 2022-09-27 NOTE — Telephone Encounter (Signed)
Patient will need a stat chest xray tomorrow. Van transportation- port lab/ smc- Lauren's sch / +- fluid clinic apt.  

## 2022-09-27 NOTE — Telephone Encounter (Signed)
Per Leotis Shames- "I spoke to patient. Suspect anemia is contributing as well. We'll see her tomorrow. Will need Zenaida Niece"

## 2022-09-28 ENCOUNTER — Inpatient Hospital Stay (HOSPITAL_BASED_OUTPATIENT_CLINIC_OR_DEPARTMENT_OTHER): Payer: PPO | Admitting: Nurse Practitioner

## 2022-09-28 ENCOUNTER — Ambulatory Visit
Admission: RE | Admit: 2022-09-28 | Discharge: 2022-09-28 | Disposition: A | Discharge status: Home / Self Care | Payer: PPO | Source: Ambulatory Visit | Attending: Nurse Practitioner | Admitting: Nurse Practitioner

## 2022-09-28 ENCOUNTER — Inpatient Hospital Stay: Payer: PPO

## 2022-09-28 ENCOUNTER — Telehealth: Payer: Self-pay

## 2022-09-28 ENCOUNTER — Encounter: Payer: Self-pay | Admitting: Nurse Practitioner

## 2022-09-28 ENCOUNTER — Other Ambulatory Visit: Payer: Self-pay

## 2022-09-28 ENCOUNTER — Other Ambulatory Visit: Payer: Self-pay | Admitting: *Deleted

## 2022-09-28 VITALS — BP 194/82 | HR 58 | Temp 98.4°F | Wt 201.0 lb

## 2022-09-28 VITALS — BP 193/58 | HR 60 | Temp 98.4°F

## 2022-09-28 DIAGNOSIS — R0602 Shortness of breath: Secondary | ICD-10-CM

## 2022-09-28 DIAGNOSIS — C541 Malignant neoplasm of endometrium: Secondary | ICD-10-CM

## 2022-09-28 DIAGNOSIS — D649 Anemia, unspecified: Secondary | ICD-10-CM

## 2022-09-28 DIAGNOSIS — J441 Chronic obstructive pulmonary disease with (acute) exacerbation: Secondary | ICD-10-CM

## 2022-09-28 DIAGNOSIS — Z5112 Encounter for antineoplastic immunotherapy: Secondary | ICD-10-CM | POA: Diagnosis not present

## 2022-09-28 LAB — CMP (CANCER CENTER ONLY)
ALT: 22 U/L (ref 0–44)
AST: 16 U/L (ref 15–41)
Albumin: 3.6 g/dL (ref 3.5–5.0)
Alkaline Phosphatase: 97 U/L (ref 38–126)
Anion gap: 9 (ref 5–15)
BUN: 20 mg/dL (ref 8–23)
CO2: 27 mmol/L (ref 22–32)
Calcium: 8.8 mg/dL — ABNORMAL LOW (ref 8.9–10.3)
Chloride: 104 mmol/L (ref 98–111)
Creatinine: 0.85 mg/dL (ref 0.44–1.00)
GFR, Estimated: >60 mL/min (ref >60)
Glucose, Bld: 97 mg/dL (ref 70–99)
Potassium: 4.1 mmol/L (ref 3.5–5.1)
Sodium: 140 mmol/L (ref 135–145)
Total Bilirubin: 0.7 mg/dL (ref 0.3–1.2)
Total Protein: 6.6 g/dL (ref 6.5–8.1)

## 2022-09-28 LAB — CBC WITH DIFFERENTIAL (CANCER CENTER ONLY)
Abs Immature Granulocytes: 0.03 10*3/uL (ref 0.00–0.07)
Basophils Absolute: 0 10*3/uL (ref 0.0–0.1)
Basophils Relative: 0 %
Eosinophils Absolute: 0 10*3/uL (ref 0.0–0.5)
Eosinophils Relative: 0 %
HCT: 27 % — ABNORMAL LOW (ref 36.0–46.0)
Hemoglobin: 8.5 g/dL — ABNORMAL LOW (ref 12.0–15.0)
Immature Granulocytes: 0 %
Lymphocytes Relative: 5 %
Lymphs Abs: 0.3 10*3/uL — ABNORMAL LOW (ref 0.7–4.0)
MCH: 29.6 pg (ref 26.0–34.0)
MCHC: 31.5 g/dL (ref 30.0–36.0)
MCV: 94.1 fL (ref 80.0–100.0)
Monocytes Absolute: 0.5 10*3/uL (ref 0.1–1.0)
Monocytes Relative: 7 %
Neutro Abs: 6.3 10*3/uL (ref 1.7–7.7)
Neutrophils Relative %: 88 %
Platelet Count: 94 10*3/uL — ABNORMAL LOW (ref 150–400)
RBC: 2.87 MIL/uL — ABNORMAL LOW (ref 3.87–5.11)
RDW: 18.6 % — ABNORMAL HIGH (ref 11.5–15.5)
WBC Count: 7.2 10*3/uL (ref 4.0–10.5)
nRBC: 0 % (ref 0.0–0.2)

## 2022-09-28 LAB — IRON AND TIBC
Iron: 67 ug/dL (ref 28–170)
Saturation Ratios: 24 % (ref 10.4–31.8)
TIBC: 281 ug/dL (ref 250–450)
UIBC: 214 ug/dL

## 2022-09-28 LAB — SAMPLE TO BLOOD BANK

## 2022-09-28 LAB — FERRITIN: Ferritin: 67 ng/mL (ref 11–307)

## 2022-09-28 MED ORDER — IPRATROPIUM-ALBUTEROL 0.5-2.5 (3) MG/3ML IN SOLN: 3.0000 mL | Freq: Four times a day (QID) | RESPIRATORY_TRACT | 1 refills | Status: DC | PRN

## 2022-09-28 MED ORDER — PREDNISONE 20 MG PO TABS: 20.0000 mg | ORAL_TABLET | Freq: Every day | ORAL | 0 refills | Status: DC

## 2022-09-28 MED ORDER — SODIUM CHLORIDE 0.9 % IV SOLN
Freq: Once | INTRAVENOUS | Status: AC
  Filled 2022-09-28: qty 250

## 2022-09-28 MED ORDER — HEPARIN SOD (PORK) LOCK FLUSH 100 UNIT/ML IV SOLN
500.0000 [IU] | Freq: Once | INTRAVENOUS | Status: AC
  Administered 2022-09-28: 500 [IU] via INTRAVENOUS
  Filled 2022-09-28: qty 5

## 2022-09-28 MED ORDER — AMOXICILLIN-POT CLAVULANATE 875-125 MG PO TABS: 1.0000 | ORAL_TABLET | Freq: Two times a day (BID) | ORAL | 0 refills | Status: AC

## 2022-09-28 MED ORDER — SODIUM CHLORIDE 0.9% FLUSH
10.0000 mL | Freq: Once | INTRAVENOUS | Status: AC | PRN
  Administered 2022-09-28: 10 mL
  Filled 2022-09-28: qty 10

## 2022-09-28 MED ORDER — SODIUM CHLORIDE 0.9 % IV SOLN
200.0000 mg | Freq: Once | INTRAVENOUS | Status: AC
  Administered 2022-09-28: 200 mg via INTRAVENOUS
  Filled 2022-09-28: qty 200

## 2022-09-28 NOTE — Progress Notes (Signed)
Patient tolerated first time Venofer infusion well, no questions/concerns voiced. Monitored 30 min post transfusion. Patient stable at discharge.  BP still elevated 193.58. NP notifed and ok to discharge. Educated on what signs to watch for and report. AVS given.

## 2022-09-28 NOTE — Progress Notes (Signed)
Symptom Management Clinic  [facility] A Department of the [place]. [facility] [address], [address] [place], [place] [address] [phone] (phone) [phone] (fax)  Patient Care Team: Marguarite Arbour, MD as PCP - General (Internal Medicine) Benita Gutter, RN as Oncology Nurse Navigator   Name of the patient: Terri [name]  [id number]  Sep 04, 1950   Date of visit: 09/28/22  Diagnosis- Endometrial cancer  Chief complaint/ Reason for visit- shortness of breath  Heme/Onc history:  Oncology History  Endometrial cancer (HCC)  01/25/2022 Initial Diagnosis   Endometrial cancer (HCC)   05/30/2022 Cancer Staging   Staging form: Corpus Uteri - Carcinoma and Carcinosarcoma, AJCC 8th Edition - Clinical stage from 05/30/2022: FIGO Stage IIIB (cT3b, cN0, cM0) - Signed by Jeralyn Ruths, MD on 05/30/2022 Stage prefix: Initial diagnosis   06/20/2022 -  Chemotherapy   Patient is on Treatment Plan : Endometrial carboplatin (5) + Paclitaxel (175) + Pembrolizumab (200) D1 q21d       Interval history- Patient is 72 year old female, currently s/p cycle 5 of neoadjuvant carbo-paclitaxel- pembrolizumab, who presents to [facility] Management Clinic for complaints of shortness of breath. Symptoms primarily with exertion. She felt like she might be having an asthma exacerbation and saw her PCP who restarted her advair. She had albuterol inhaler which she's used and had some improvement. Symptoms improve with rest. She had echo with cardiologist but hasn't yet received results. Does not have a pulmonologist. No chest pain.   ECOG FS:2 - Symptomatic, <50% confined to bed  Review of systems- Review of Systems  Constitutional:  Positive for malaise/fatigue. Negative for chills, fever and weight loss.  Respiratory:  Positive for cough, sputum production and shortness of breath. Negative for hemoptysis and wheezing.   Cardiovascular:  Negative  for chest pain, palpitations, orthopnea, claudication and leg swelling.  Gastrointestinal:  Negative for abdominal pain, blood in stool, constipation, diarrhea and melena.  Genitourinary: Negative.        Vaginal spotting  Musculoskeletal:  Negative for falls and myalgias.  Skin:  Negative for rash.  Neurological:  Negative for dizziness, loss of consciousness and headaches.  Psychiatric/Behavioral:  Negative for depression and substance abuse. The patient is nervous/anxious.      No Known Allergies  Past Medical History:  Diagnosis Date   Adenomatous colon polyp    Aortic stenosis    Arthritis    Asthma    Claudication (HCC)    COPD (chronic obstructive pulmonary disease) (HCC)    Depression    Diabetes mellitus without complication (HCC)    Esophageal dysphagia    GERD (gastroesophageal reflux disease)    Hyperlipemia    Hypertension    Obesity    Severe obesity (BMI 35.0-35.9 with comorbidity) (HCC)     Past Surgical History:  Procedure Laterality Date   BLADDER SURGERY     COLONOSCOPY WITH PROPOFOL N/A 08/05/2015   Procedure: COLONOSCOPY WITH PROPOFOL;  Surgeon: Elnita Maxwell, MD;  Location: [facility] ENDOSCOPY;  Service: Endoscopy;  Laterality: N/A;   ESOPHAGOGASTRODUODENOSCOPY N/A 01/09/2022   Procedure: ESOPHAGOGASTRODUODENOSCOPY (EGD);  Surgeon: Regis Bill, MD;  Location: [facility] ENDOSCOPY;  Service: Endoscopy;  Laterality: N/A;   FOOT SURGERY Left    FRACTURE SURGERY     IR CV LINE INJECTION  06/22/2022   IR IMAGING GUIDED PORT INSERTION  06/05/2022   IR PORT REPAIR CENTRAL VENOUS ACCESS DEVICE  07/02/2022   ORIF ANKLE FRACTURE Left 03/26/2017  Procedure: OPEN REDUCTION INTERNAL FIXATION (ORIF) ANKLE FRACTURE;  Surgeon: Christena Flake, MD;  Location: [facility] ORS;  Service: Orthopedics;  Laterality: Left;    Social History   Socioeconomic History   Marital status: Single    Spouse name: Not on file   Number of children: Not on file   Years of education: Not  on file   Highest education level: Not on file  Occupational History   Not on file  Tobacco Use   Smoking status: Former    Packs/day: 1.00    Years: 40.00    Additional pack years: 0.00    Total pack years: 40.00    Types: Cigarettes   Smokeless tobacco: Never  Vaping Use   Vaping Use: Never used  Substance and Sexual Activity   Alcohol use: No   Drug use: No   Sexual activity: Not on file  Other Topics Concern   Not on file  Social History Narrative   Not on file   Social Determinants of Health   Financial Resource Strain: Low Risk  (09/20/2022)   Overall Financial Resource Strain (CARDIA)    Difficulty of Paying Living Expenses: Not very hard  Food Insecurity: No Food Insecurity (05/30/2022)   Hunger Vital Sign    Worried About Running Out of Food in the Last Year: Never true    Ran Out of Food in the Last Year: Never true  Transportation Needs: Unmet Transportation Needs (09/12/2022)   PRAPARE - Transportation    Lack of Transportation (Medical): Yes    Lack of Transportation (Non-Medical): Yes  Physical Activity: Inactive (09/20/2022)   Exercise Vital Sign    Days of Exercise per Week: 0 days    Minutes of Exercise per Session: 0 min  Stress: Stress Concern Present (09/20/2022)   [employer] of Occupational Health - Occupational Stress Questionnaire    Feeling of Stress : To some extent  Social Connections: Socially Isolated (09/20/2022)   Social Connection and Isolation Panel [NHANES]    Frequency of Communication with Friends and Family: Once a week    Frequency of Social Gatherings with Friends and Family: Never    Attends Religious Services: Never    Database administrator or Organizations: No    Attends Banker Meetings: Never    Marital Status: Never married  Intimate Partner Violence: Not At Risk (05/30/2022)   Humiliation, Afraid, Rape, and Kick questionnaire    Fear of Current or Ex-Partner: No    Emotionally Abused: No    Physically  Abused: No    Sexually Abused: No    Family History  Problem Relation Age of Onset   CVA Mother    Diabetes Mother    CAD Father    Breast cancer Neg Hx      Current Outpatient Medications:    albuterol (PROVENTIL HFA;VENTOLIN HFA) 108 (90 Base) MCG/ACT inhaler, Inhale 2 puffs into the lungs every 6 (six) hours as needed for wheezing or shortness of breath., Disp: , Rfl:    amLODipine (NORVASC) 10 MG tablet, Take 10 mg by mouth daily., Disp: , Rfl:    atorvastatin (LIPITOR) 80 MG tablet, Take 80 mg by mouth daily., Disp: , Rfl:    buPROPion (WELLBUTRIN XL) 300 MG 24 hr tablet, Take 300 mg by mouth daily., Disp: , Rfl:    cholecalciferol (VITAMIN D) 400 units TABS tablet, Take 400 Units by mouth daily., Disp: , Rfl:    citalopram (CELEXA) 20 MG tablet, Take 20  mg by mouth daily., Disp: , Rfl:    cloNIDine (CATAPRES) 0.2 MG tablet, Take 0.2 mg by mouth 2 (two) times daily., Disp: , Rfl:    fluticasone-salmeterol (ADVAIR) 250-50 MCG/ACT AEPB, Inhale 1 puff into the lungs in the morning and at bedtime., Disp: , Rfl:    glipiZIDE (GLUCOTROL XL) 10 MG 24 hr tablet, Take 1 tablet by mouth daily., Disp: , Rfl:    insulin aspart (NOVOLOG) 100 UNIT/ML injection, Inject 13 Units into the skin 3 (three) times daily before meals., Disp: , Rfl:    Insulin Degludec 100 UNIT/ML SOLN, Inject into the skin., Disp: , Rfl:    insulin detemir (LEVEMIR FLEXPEN) 100 UNIT/ML FlexPen, Inject 25 units in the morning. Inject 23 units in the evening., Disp: , Rfl:    losartan-hydrochlorothiazide (HYZAAR) 100-25 MG tablet, Take 1 tablet by mouth daily., Disp: , Rfl:    metoprolol succinate (TOPROL-XL) 50 MG 24 hr tablet, Take 1 tablet by mouth daily., Disp: , Rfl:    montelukast (SINGULAIR) 10 MG tablet, Take 10 mg by mouth at bedtime., Disp: , Rfl:    ondansetron (ZOFRAN) 8 MG tablet, Take by mouth., Disp: , Rfl:    ONETOUCH VERIO test strip, 1 each 3 (three) times daily., Disp: , Rfl:    pantoprazole (PROTONIX)  20 MG tablet, Take 20 mg by mouth daily., Disp: , Rfl:    prochlorperazine (COMPAZINE) 10 MG tablet, Take 1 tablet (10 mg total) by mouth every 6 (six) hours as needed for nausea or vomiting., Disp: 60 tablet, Rfl: 2   lidocaine-prilocaine (EMLA) cream, Apply to affected area once (Patient not taking: Reported on 09/12/2022), Disp: 30 g, Rfl: 3  Physical exam:  Vitals:   09/28/22 1034 09/28/22 1039  BP: (!) 264/164 (!) 194/82  Pulse: (!) 58   Temp: 98.4 F (36.9 C)   TempSrc: Tympanic   SpO2: 99%   Weight: 201 lb (91.2 kg)    Physical Exam Constitutional:      Appearance: She is not ill-appearing.  Cardiovascular:     Rate and Rhythm: Normal rate and regular rhythm.     Heart sounds: Murmur heard.     Systolic murmur is present with a grade of 3/6.  Pulmonary:     Effort: No respiratory distress.     Breath sounds: No wheezing, rhonchi or rales.     Comments: Decreased breath sounds bilaterally Abdominal:     General: There is no distension.     Tenderness: There is no abdominal tenderness.  Musculoskeletal:        General: No swelling or deformity.     Right lower leg: No edema.     Left lower leg: No edema.  Skin:    General: Skin is warm and dry.     Coloration: Skin is not pale.  Neurological:     Mental Status: She is alert and oriented to person, place, and time.        Latest Ref Rng & Units 09/28/2022   10:19 AM  CMP  Glucose 70 - 99 mg/dL 97   BUN 8 - 23 mg/dL 20   Creatinine 4.09 - 1.00 mg/dL 8.11   Sodium 914 - 782 mmol/L 140   Potassium 3.5 - 5.1 mmol/L 4.1   Chloride 98 - 111 mmol/L 104   CO2 22 - 32 mmol/L 27   Calcium 8.9 - 10.3 mg/dL 8.8   Total Protein 6.5 - 8.1 g/dL 6.6   Total Bilirubin 0.3 -  1.2 mg/dL 0.7   Alkaline Phos 38 - 126 U/L 97   AST 15 - 41 U/L 16   ALT 0 - 44 U/L 22       Latest Ref Rng & Units 09/28/2022   10:19 AM  CBC  WBC 4.0 - 10.5 K/uL 7.2   Hemoglobin 12.0 - 15.0 g/dL 8.5   Hematocrit 16.1 - 46.0 % 27.0   Platelets  150 - 400 K/uL 94    No results found.  Assessment and plan- Patient is a 72 y.o. female diagnosed with endometrial cancer currently undergoing chemotherapy and immunotherapy presents to symptom management clinic for:   Shortness of breath- suspect worsening anemia d/t chemotherapy in setting of her comorbid pulmonary and cardiac issues as well as deconditioning are etiology. Chest xray reviewed and no definitive infectious component but symptomatically suspicious for copd exacerbation. Will treat with augmentin bid x 5 days with concurrent prednisone 20 mg daily x 5 days. We reviewed the possible interaction of steroids and immunotherapy in adddition to her diabetes. Will reduce dose and treat for short course. Duoneb and nebulizer machine sent to home health. No hypoxia.  Anemia- d/t chemotherapy and question iron deficienc ycomponent given on going bleeding. Though lighter. IV Venofer today. Reviewed risks including anaphylaxis. Patient agrees. Hmg 8.4. May require transfusion if hemoglobin < 8.  Possible remote history of copd and/or asthma- has not been on maintenance medications in some time. Has restarted advair per pcp. Recommend evaluation and management including PFTs with pulmonology which we'll need for possible surgical evaluation as well. Referral to pulmonology sent.  Hypertension- encouraged patient to check at home. Clinically asymptomatic. Recent echo. Continue compliance with medications and contact cardiology if persistently elevated. Reviewed ER symptoms.  Heart murmur- recent echo. Awaiting cardiac evaluation.   Disposition:  Venofer today Ref to pulm Home health for nebulizer machine F/u as scheduled next week- la    Visit Diagnosis 1. Anemia, unspecified type   2. Shortness of breath   3. Endometrial cancer (HCC)   4. COPD with acute exacerbation [facility])     Patient expressed understanding and was in agreement with this plan. She also understands that She can call  clinic at any time with any questions, concerns, or complaints.   Thank you for allowing me to participate in the care of this very pleasant patient.   Consuello Masse, DNP, AGNP-C, AOCNP [facility] [phone]

## 2022-09-28 NOTE — Patient Instructions (Signed)

## 2022-09-28 NOTE — Telephone Encounter (Signed)
Faxed DME nebulizer order, patient demo, and todays note over to [employer]

## 2022-09-28 NOTE — Progress Notes (Signed)
Per NP ok to proceed with first time Venofer BP 194/ 82

## 2022-10-01 ENCOUNTER — Encounter: Payer: Self-pay | Admitting: *Deleted

## 2022-10-01 ENCOUNTER — Other Ambulatory Visit: Payer: Self-pay | Admitting: Internal Medicine

## 2022-10-01 ENCOUNTER — Telehealth: Payer: Self-pay | Admitting: *Deleted

## 2022-10-01 ENCOUNTER — Ambulatory Visit: Payer: PPO

## 2022-10-01 DIAGNOSIS — R928 Other abnormal and inconclusive findings on diagnostic imaging of breast: Secondary | ICD-10-CM

## 2022-10-01 DIAGNOSIS — N6489 Other specified disorders of breast: Secondary | ICD-10-CM

## 2022-10-01 NOTE — Telephone Encounter (Signed)
Patient called stating that she has been on Prednisone and her last dose is due on her chemotherapy day. She reports that she is diabetic and that she needs to know if she is to take her last dose on the day of chemotherapy or not. Please advise

## 2022-10-01 NOTE — Telephone Encounter (Signed)
After trying multiple times on my phone and another phone, I was unable to get call to go through to patient so I sent hear message on MyChart

## 2022-10-02 ENCOUNTER — Other Ambulatory Visit: Payer: Self-pay

## 2022-10-02 MED FILL — Dexamethasone Sodium Phosphate Inj 100 MG/10ML: INTRAMUSCULAR | Qty: 1 | Status: AC

## 2022-10-02 MED FILL — Fosaprepitant Dimeglumine For IV Infusion 150 MG (Base Eq): INTRAVENOUS | Qty: 5 | Status: AC

## 2022-10-02 MED FILL — Iron Sucrose Inj 20 MG/ML (Fe Equiv): INTRAVENOUS | Qty: 10 | Status: AC

## 2022-10-03 ENCOUNTER — Encounter: Payer: Self-pay | Admitting: Oncology

## 2022-10-03 ENCOUNTER — Inpatient Hospital Stay: Payer: PPO | Attending: Obstetrics and Gynecology

## 2022-10-03 ENCOUNTER — Inpatient Hospital Stay: Payer: PPO

## 2022-10-03 ENCOUNTER — Inpatient Hospital Stay (HOSPITAL_BASED_OUTPATIENT_CLINIC_OR_DEPARTMENT_OTHER): Payer: PPO | Admitting: Oncology

## 2022-10-03 DIAGNOSIS — I10 Essential (primary) hypertension: Secondary | ICD-10-CM | POA: Diagnosis not present

## 2022-10-03 DIAGNOSIS — C541 Malignant neoplasm of endometrium: Secondary | ICD-10-CM | POA: Diagnosis not present

## 2022-10-03 DIAGNOSIS — Z923 Personal history of irradiation: Secondary | ICD-10-CM | POA: Diagnosis not present

## 2022-10-03 DIAGNOSIS — Z5111 Encounter for antineoplastic chemotherapy: Secondary | ICD-10-CM | POA: Insufficient documentation

## 2022-10-03 DIAGNOSIS — Z9221 Personal history of antineoplastic chemotherapy: Secondary | ICD-10-CM | POA: Diagnosis not present

## 2022-10-03 DIAGNOSIS — D649 Anemia, unspecified: Secondary | ICD-10-CM | POA: Diagnosis not present

## 2022-10-03 DIAGNOSIS — Z5112 Encounter for antineoplastic immunotherapy: Secondary | ICD-10-CM | POA: Insufficient documentation

## 2022-10-03 DIAGNOSIS — Z5189 Encounter for other specified aftercare: Secondary | ICD-10-CM | POA: Insufficient documentation

## 2022-10-03 DIAGNOSIS — C7982 Secondary malignant neoplasm of genital organs: Secondary | ICD-10-CM | POA: Diagnosis not present

## 2022-10-03 DIAGNOSIS — D696 Thrombocytopenia, unspecified: Secondary | ICD-10-CM | POA: Diagnosis not present

## 2022-10-03 LAB — COMPREHENSIVE METABOLIC PANEL
ALT: 18 U/L (ref 0–44)
AST: 15 U/L (ref 15–41)
Albumin: 3.3 g/dL — ABNORMAL LOW (ref 3.5–5.0)
Alkaline Phosphatase: 68 U/L (ref 38–126)
Anion gap: 6 (ref 5–15)
BUN: 23 mg/dL (ref 8–23)
CO2: 29 mmol/L (ref 22–32)
Calcium: 8.4 mg/dL — ABNORMAL LOW (ref 8.9–10.3)
Chloride: 107 mmol/L (ref 98–111)
Creatinine, Ser: 0.98 mg/dL (ref 0.44–1.00)
GFR, Estimated: >60 mL/min (ref >60)
Glucose, Bld: 191 mg/dL — ABNORMAL HIGH (ref 70–99)
Potassium: 3.7 mmol/L (ref 3.5–5.1)
Sodium: 142 mmol/L (ref 135–145)
Total Bilirubin: 0.4 mg/dL (ref 0.3–1.2)
Total Protein: 5.7 g/dL — ABNORMAL LOW (ref 6.5–8.1)

## 2022-10-03 LAB — CBC WITH DIFFERENTIAL/PLATELET
Abs Immature Granulocytes: 0.05 10*3/uL (ref 0.00–0.07)
Basophils Absolute: 0 10*3/uL (ref 0.0–0.1)
Basophils Relative: 1 %
Eosinophils Absolute: 0 10*3/uL (ref 0.0–0.5)
Eosinophils Relative: 0 %
HCT: 27.1 % — ABNORMAL LOW (ref 36.0–46.0)
Hemoglobin: 8.3 g/dL — ABNORMAL LOW (ref 12.0–15.0)
Immature Granulocytes: 1 %
Lymphocytes Relative: 6 %
Lymphs Abs: 0.4 10*3/uL — ABNORMAL LOW (ref 0.7–4.0)
MCH: 29.5 pg (ref 26.0–34.0)
MCHC: 30.6 g/dL (ref 30.0–36.0)
MCV: 96.4 fL (ref 80.0–100.0)
Monocytes Absolute: 0.6 10*3/uL (ref 0.1–1.0)
Monocytes Relative: 10 %
Neutro Abs: 5.2 10*3/uL (ref 1.7–7.7)
Neutrophils Relative %: 82 %
Platelets: 122 10*3/uL — ABNORMAL LOW (ref 150–400)
RBC: 2.81 MIL/uL — ABNORMAL LOW (ref 3.87–5.11)
RDW: 19.4 % — ABNORMAL HIGH (ref 11.5–15.5)
WBC: 6.3 10*3/uL (ref 4.0–10.5)
nRBC: 0 % (ref 0.0–0.2)

## 2022-10-03 MED ORDER — DIPHENHYDRAMINE HCL 50 MG/ML IJ SOLN
25.0000 mg | Freq: Once | INTRAMUSCULAR | Status: AC
  Administered 2022-10-03: 25 mg via INTRAVENOUS
  Filled 2022-10-03: qty 1

## 2022-10-03 MED ORDER — FAMOTIDINE 20 MG IN NS 100 ML IVPB
20.0000 mg | Freq: Once | INTRAVENOUS | Status: AC
  Administered 2022-10-03: 20 mg via INTRAVENOUS
  Filled 2022-10-03: qty 100

## 2022-10-03 MED ORDER — PALONOSETRON HCL INJECTION 0.25 MG/5ML
0.2500 mg | Freq: Once | INTRAVENOUS | Status: AC
  Administered 2022-10-03: 0.25 mg via INTRAVENOUS
  Filled 2022-10-03: qty 5

## 2022-10-03 MED ORDER — SODIUM CHLORIDE 0.9 % IV SOLN
10.0000 mg | Freq: Once | INTRAVENOUS | Status: AC
  Administered 2022-10-03: 10 mg via INTRAVENOUS
  Filled 2022-10-03: qty 10

## 2022-10-03 MED ORDER — SODIUM CHLORIDE 0.9 % IV SOLN
175.0000 mg/m2 | Freq: Once | INTRAVENOUS | Status: AC
  Administered 2022-10-03: 348 mg via INTRAVENOUS
  Filled 2022-10-03: qty 58

## 2022-10-03 MED ORDER — HEPARIN SOD (PORK) LOCK FLUSH 100 UNIT/ML IV SOLN
500.0000 [IU] | Freq: Once | INTRAVENOUS | Status: AC | PRN
  Administered 2022-10-03: 500 [IU]
  Filled 2022-10-03: qty 5

## 2022-10-03 MED ORDER — FAMOTIDINE IN NACL 20-0.9 MG/50ML-% IV SOLN: 20.0000 mg | Freq: Once | INTRAVENOUS | Status: DC

## 2022-10-03 MED ORDER — SODIUM CHLORIDE 0.9 % IV SOLN
492.0000 mg | Freq: Once | INTRAVENOUS | Status: AC
  Administered 2022-10-03: 490 mg via INTRAVENOUS
  Filled 2022-10-03: qty 49

## 2022-10-03 MED ORDER — SODIUM CHLORIDE 0.9 % IV SOLN
200.0000 mg | Freq: Once | INTRAVENOUS | Status: AC
  Administered 2022-10-03: 200 mg via INTRAVENOUS
  Filled 2022-10-03: qty 8

## 2022-10-03 MED ORDER — SODIUM CHLORIDE 0.9 % IV SOLN
150.0000 mg | Freq: Once | INTRAVENOUS | Status: AC
  Administered 2022-10-03: 150 mg via INTRAVENOUS
  Filled 2022-10-03: qty 150

## 2022-10-03 MED ORDER — SODIUM CHLORIDE 0.9 % IV SOLN
Freq: Once | INTRAVENOUS | Status: AC
  Filled 2022-10-03: qty 250

## 2022-10-03 NOTE — Progress Notes (Signed)
Contacted cardiology to ask if she can be worked back in on there schedule before July 8 as scheduled. Appears Ms. Redinger had cancelled previous appointment to due schedule conflict and transportation. They should reach out to her. Will follow up on pulmonology appointment.

## 2022-10-03 NOTE — Progress Notes (Signed)
Patient here today for follow up and treatment consideration regarding endometrial cancer. Patient reports improvement in shortness of breath. Patient reports continued leg pain, referral made to vascular surgery on 5/15.

## 2022-10-03 NOTE — Patient Instructions (Signed)
[place] [facility]  Discharge Instructions: Thank you for choosing [facility] to provide your oncology and hematology care.  If you have a lab appointment with the Cancer Center, please go directly to the Cancer Center and check in at the registration area.  Wear comfortable clothing and clothing appropriate for easy access to any Portacath or PICC line.   We strive to give you quality time with your provider. You may need to reschedule your appointment if you arrive late (15 or more minutes).  Arriving late affects you and other patients whose appointments are after yours.  Also, if you miss three or more appointments without notifying the office, you may be dismissed from the clinic at the provider's discretion.      For prescription refill requests, have your pharmacy contact our office and allow 72 hours for refills to be completed.    Today you received the following chemotherapy and/or immunotherapy agents KEYTRUDA, TAXOL & CARBOPLATIN      To help prevent nausea and vomiting after your treatment, we encourage you to take your nausea medication as directed.  BELOW ARE SYMPTOMS THAT SHOULD BE REPORTED IMMEDIATELY: *FEVER GREATER THAN 100.4 F (38 C) OR HIGHER *CHILLS OR SWEATING *NAUSEA AND VOMITING THAT IS NOT CONTROLLED WITH YOUR NAUSEA MEDICATION *UNUSUAL SHORTNESS OF BREATH *UNUSUAL BRUISING OR BLEEDING *URINARY PROBLEMS (pain or burning when urinating, or frequent urination) *BOWEL PROBLEMS (unusual diarrhea, constipation, pain near the anus) TENDERNESS IN MOUTH AND THROAT WITH OR WITHOUT PRESENCE OF ULCERS (sore throat, sores in mouth, or a toothache) UNUSUAL RASH, SWELLING OR PAIN  UNUSUAL VAGINAL DISCHARGE OR ITCHING   Items with * indicate a potential emergency and should be followed up as soon as possible or go to the Emergency Department if any problems should occur.  Please show the CHEMOTHERAPY ALERT CARD or IMMUNOTHERAPY ALERT  CARD at check-in to the Emergency Department and triage nurse.  Should you have questions after your visit or need to cancel or reschedule your appointment, please contact [place] [facility]  [phone] and follow the prompts.  Office hours are 8:00 a.m. to 4:30 p.m. Monday - Friday. Please note that voicemails left after 4:00 p.m. may not be returned until the following business day.  We are closed weekends and major holidays. You have access to a nurse at all times for urgent questions. Please call the main number to the clinic [phone] and follow the prompts.  For any non-urgent questions, you may also contact your provider using MyChart. We now offer e-Visits for anyone 73 and older to request care online for non-urgent symptoms. For details visit [email or web address].   Also download the MyChart app! Go to the app store, search "MyChart", open the app, select , and log in with your MyChart username and password.  Pembrolizumab Injection What is this medication? PEMBROLIZUMAB (PEM broe LIZ ue mab) treats some types of cancer. It works by helping your immune system slow or stop the spread of cancer cells. It is a monoclonal antibody. This medicine may be used for other purposes; ask your health care provider or pharmacist if you have questions. COMMON BRAND NAME(S): Keytruda What should I tell my care team before I take this medication? They need to know if you have any of these conditions: Allogeneic stem cell transplant (uses someone else's stem cells) Autoimmune diseases, such as Crohn disease, ulcerative colitis, lupus History of chest radiation Nervous system problems, such as Guillain-Barre  syndrome, myasthenia gravis Organ transplant An unusual or allergic reaction to pembrolizumab, other medications, foods, dyes, or preservatives Pregnant or trying to get pregnant Breast-feeding How should I use this medication? This medication is  injected into a vein. It is given by your care team in a hospital or clinic setting. A special MedGuide will be given to you before each treatment. Be sure to read this information carefully each time. Talk to your care team about the use of this medication in children. While it may be prescribed for children as young as 6 months for selected conditions, precautions do apply. Overdosage: If you think you have taken too much of this medicine contact a poison control center or emergency room at once. NOTE: This medicine is only for you. Do not share this medicine with others. What if I miss a dose? Keep appointments for follow-up doses. It is important not to miss your dose. Call your care team if you are unable to keep an appointment. What may interact with this medication? Interactions have not been studied. This list may not describe all possible interactions. Give your health care provider a list of all the medicines, herbs, non-prescription drugs, or dietary supplements you use. Also tell them if you smoke, drink alcohol, or use illegal drugs. Some items may interact with your medicine. What should I watch for while using this medication? Your condition will be monitored carefully while you are receiving this medication. You may need blood work while taking this medication. This medication may cause serious skin reactions. They can happen weeks to months after starting the medication. Contact your care team right away if you notice fevers or flu-like symptoms with a rash. The rash may be red or purple and then turn into blisters or peeling of the skin. You may also notice a red rash with swelling of the face, lips, or lymph nodes in your neck or under your arms. Tell your care team right away if you have any change in your eyesight. Talk to your care team if you may be pregnant. Serious birth defects can occur if you take this medication during pregnancy and for 4 months after the last dose. You  will need a negative pregnancy test before starting this medication. Contraception is recommended while taking this medication and for 4 months after the last dose. Your care team can help you find the option that works for you. Do not breastfeed while taking this medication and for 4 months after the last dose. What side effects may I notice from receiving this medication? Side effects that you should report to your care team as soon as possible: Allergic reactions--skin rash, itching, hives, swelling of the face, lips, tongue, or throat Dry cough, shortness of breath or trouble breathing Eye pain, redness, irritation, or discharge with blurry or decreased vision Heart muscle inflammation--unusual weakness or fatigue, shortness of breath, chest pain, fast or irregular heartbeat, dizziness, swelling of the ankles, feet, or hands Hormone gland problems--headache, sensitivity to light, unusual weakness or fatigue, dizziness, fast or irregular heartbeat, increased sensitivity to cold or heat, excessive sweating, constipation, hair loss, increased thirst or amount of urine, tremors or shaking, irritability Infusion reactions--chest pain, shortness of breath or trouble breathing, feeling faint or lightheaded Kidney injury (glomerulonephritis)--decrease in the amount of urine, red or dark [place] urine, foamy or bubbly urine, swelling of the ankles, hands, or feet Liver injury--right upper belly pain, loss of appetite, nausea, light-colored stool, dark yellow or Steines urine, yellowing skin or  eyes, unusual weakness or fatigue Pain, tingling, or numbness in the hands or feet, muscle weakness, change in vision, confusion or trouble speaking, loss of balance or coordination, trouble walking, seizures Rash, fever, and swollen lymph nodes Redness, blistering, peeling, or loosening of the skin, including inside the mouth Sudden or severe stomach pain, bloody diarrhea, fever, nausea, vomiting Side effects that  usually do not require medical attention (report to your care team if they continue or are bothersome): Bone, joint, or muscle pain Diarrhea Fatigue Loss of appetite Nausea Skin rash This list may not describe all possible side effects. Call your doctor for medical advice about side effects. You may report side effects to FDA at 1-800-FDA-1088. Where should I keep my medication? This medication is given in a hospital or clinic. It will not be stored at home. NOTE: This sheet is a summary. It may not cover all possible information. If you have questions about this medicine, talk to your doctor, pharmacist, or health care provider.  2024 Elsevier/Gold Standard (2021-08-29 00:00:00)   Paclitaxel Injection What is this medication? PACLITAXEL (PAK li TAX el) treats some types of cancer. It works by slowing down the growth of cancer cells. This medicine may be used for other purposes; ask your health care provider or pharmacist if you have questions. COMMON BRAND NAME(S): Onxol, Taxol What should I tell my care team before I take this medication? They need to know if you have any of these conditions: Heart disease Liver disease Low white blood cell levels An unusual or allergic reaction to paclitaxel, other medications, foods, dyes, or preservatives If you or your partner are pregnant or trying to get pregnant Breast-feeding How should I use this medication? This medication is injected into a vein. It is given by your care team in a hospital or clinic setting. Talk to your care team about the use of this medication in children. While it may be given to children for selected conditions, precautions do apply. Overdosage: If you think you have taken too much of this medicine contact a poison control center or emergency room at once. NOTE: This medicine is only for you. Do not share this medicine with others. What if I miss a dose? Keep appointments for follow-up doses. It is important not to  miss your dose. Call your care team if you are unable to keep an appointment. What may interact with this medication? Do not take this medication with any of the following: Live virus vaccines Other medications may affect the way this medication works. Talk with your care team about all of the medications you take. They may suggest changes to your treatment plan to lower the risk of side effects and to make sure your medications work as intended. This list may not describe all possible interactions. Give your health care provider a list of all the medicines, herbs, non-prescription drugs, or dietary supplements you use. Also tell them if you smoke, drink alcohol, or use illegal drugs. Some items may interact with your medicine. What should I watch for while using this medication? Your condition will be monitored carefully while you are receiving this medication. You may need blood work while taking this medication. This medication may make you feel generally unwell. This is not uncommon as chemotherapy can affect healthy cells as well as cancer cells. Report any side effects. Continue your course of treatment even though you feel ill unless your care team tells you to stop. This medication can cause serious allergic reactions. To  reduce the risk, your care team may give you other medications to take before receiving this one. Be sure to follow the directions from your care team. This medication may increase your risk of getting an infection. Call your care team for advice if you get a fever, chills, sore throat, or other symptoms of a cold or flu. Do not treat yourself. Try to avoid being around people who are sick. This medication may increase your risk to bruise or bleed. Call your care team if you notice any unusual bleeding. Be careful brushing or flossing your teeth or using a toothpick because you may get an infection or bleed more easily. If you have any dental work done, tell your dentist you are  receiving this medication. Talk to your care team if you may be pregnant. Serious birth defects can occur if you take this medication during pregnancy. Talk to your care team before breastfeeding. Changes to your treatment plan may be needed. What side effects may I notice from receiving this medication? Side effects that you should report to your care team as soon as possible: Allergic reactions--skin rash, itching, hives, swelling of the face, lips, tongue, or throat Heart rhythm changes--fast or irregular heartbeat, dizziness, feeling faint or lightheaded, chest pain, trouble breathing Increase in blood pressure Infection--fever, chills, cough, sore throat, wounds that don't heal, pain or trouble when passing urine, general feeling of discomfort or being unwell Low blood pressure--dizziness, feeling faint or lightheaded, blurry vision Low red blood cell level--unusual weakness or fatigue, dizziness, headache, trouble breathing Painful swelling, warmth, or redness of the skin, blisters or sores at the infusion site Pain, tingling, or numbness in the hands or feet Slow heartbeat--dizziness, feeling faint or lightheaded, confusion, trouble breathing, unusual weakness or fatigue Unusual bruising or bleeding Side effects that usually do not require medical attention (report to your care team if they continue or are bothersome): Diarrhea Hair loss Joint pain Loss of appetite Muscle pain Nausea Vomiting This list may not describe all possible side effects. Call your doctor for medical advice about side effects. You may report side effects to FDA at 1-800-FDA-1088. Where should I keep my medication? This medication is given in a hospital or clinic. It will not be stored at home. NOTE: This sheet is a summary. It may not cover all possible information. If you have questions about this medicine, talk to your doctor, pharmacist, or health care provider.  2024 Elsevier/Gold Standard (2021-09-05  00:00:00)  Carboplatin Injection What is this medication? CARBOPLATIN (KAR boe pla tin) treats some types of cancer. It works by slowing down the growth of cancer cells. This medicine may be used for other purposes; ask your health care provider or pharmacist if you have questions. COMMON BRAND NAME(S): Paraplatin What should I tell my care team before I take this medication? They need to know if you have any of these conditions: Blood disorders Hearing problems Kidney disease Recent or ongoing radiation therapy An unusual or allergic reaction to carboplatin, cisplatin, other medications, foods, dyes, or preservatives Pregnant or trying to get pregnant Breast-feeding How should I use this medication? This medication is injected into a vein. It is given by your care team in a hospital or clinic setting. Talk to your care team about the use of this medication in children. Special care may be needed. Overdosage: If you think you have taken too much of this medicine contact a poison control center or emergency room at once. NOTE: This medicine is only for  you. Do not share this medicine with others. What if I miss a dose? Keep appointments for follow-up doses. It is important not to miss your dose. Call your care team if you are unable to keep an appointment. What may interact with this medication? Medications for seizures Some antibiotics, such as amikacin, gentamicin, neomycin, streptomycin, tobramycin Vaccines This list may not describe all possible interactions. Give your health care provider a list of all the medicines, herbs, non-prescription drugs, or dietary supplements you use. Also tell them if you smoke, drink alcohol, or use illegal drugs. Some items may interact with your medicine. What should I watch for while using this medication? Your condition will be monitored carefully while you are receiving this medication. You may need blood work while taking this medication. This  medication may make you feel generally unwell. This is not uncommon, as chemotherapy can affect healthy cells as well as cancer cells. Report any side effects. Continue your course of treatment even though you feel ill unless your care team tells you to stop. In some cases, you may be given additional medications to help with side effects. Follow all directions for their use. This medication may increase your risk of getting an infection. Call your care team for advice if you get a fever, chills, sore throat, or other symptoms of a cold or flu. Do not treat yourself. Try to avoid being around people who are sick. Avoid taking medications that contain aspirin, acetaminophen, ibuprofen, naproxen, or ketoprofen unless instructed by your care team. These medications may hide a fever. Be careful brushing or flossing your teeth or using a toothpick because you may get an infection or bleed more easily. If you have any dental work done, tell your dentist you are receiving this medication. Talk to your care team if you wish to become pregnant or think you might be pregnant. This medication can cause serious birth defects. Talk to your care team about effective forms of contraception. Do not breast-feed while taking this medication. What side effects may I notice from receiving this medication? Side effects that you should report to your care team as soon as possible: Allergic reactions--skin rash, itching, hives, swelling of the face, lips, tongue, or throat Infection--fever, chills, cough, sore throat, wounds that don't heal, pain or trouble when passing urine, general feeling of discomfort or being unwell Low red blood cell level--unusual weakness or fatigue, dizziness, headache, trouble breathing Pain, tingling, or numbness in the hands or feet, muscle weakness, change in vision, confusion or trouble speaking, loss of balance or coordination, trouble walking, seizures Unusual bruising or bleeding Side  effects that usually do not require medical attention (report to your care team if they continue or are bothersome): Hair loss Nausea Unusual weakness or fatigue Vomiting This list may not describe all possible side effects. Call your doctor for medical advice about side effects. You may report side effects to FDA at 1-800-FDA-1088. Where should I keep my medication? This medication is given in a hospital or clinic. It will not be stored at home. NOTE: This sheet is a summary. It may not cover all possible information. If you have questions about this medicine, talk to your doctor, pharmacist, or health care provider.  2024 Elsevier/Gold Standard (2021-08-08 00:00:00)

## 2022-10-03 NOTE — Progress Notes (Signed)
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  INTERVAL HISTORY: Patient returns to clinic today for further evaluation and consideration of cycle 6 of carboplatinum, Taxol, and Keytruda.  She continues to have claudication symptoms of her left leg, but otherwise feels well.  She is tolerating her treatments without significant side effects. She has no neurologic complaints.  She denies any recent fevers or illnesses.  She has a good appetite and denies weight loss.  She has no chest pain, shortness of breath, cough, or hemoptysis.  She denies any nausea, vomiting, constipation, or diarrhea.  She has no urinary complaints.  Patient offers no further specific complaints today.  REVIEW OF SYSTEMS:   Review of Systems  Constitutional: Negative.  Negative for fever, malaise/fatigue and weight loss.  Respiratory: Negative.  Negative for cough, hemoptysis and shortness of breath.   Cardiovascular: Negative.  Negative for chest pain and leg swelling.  Gastrointestinal: Negative.  Negative for abdominal pain, blood in stool and melena.  Genitourinary: Negative.  Negative for dysuria.  Musculoskeletal: Negative.  Negative for back pain.  Skin: Negative.  Negative for rash.  Neurological: Negative.  Negative for dizziness, focal weakness, weakness and headaches.  Psychiatric/Behavioral: Negative.  The patient is not nervous/anxious.     As per HPI. Otherwise, a complete review of systems is negative.  PAST MEDICAL HISTORY: Past Medical History:  Diagnosis Date   Adenomatous colon polyp    Aortic stenosis    Arthritis    Asthma    Claudication (HCC)    COPD (chronic obstructive  pulmonary disease) (HCC)    Depression    Diabetes mellitus without complication (HCC)    Esophageal dysphagia    GERD (gastroesophageal reflux disease)    Hyperlipemia    Hypertension    Obesity    Severe obesity (BMI 35.0-35.9 with comorbidity) (HCC)     PAST SURGICAL HISTORY: Past Surgical History:  Procedure Laterality Date   BLADDER SURGERY     COLONOSCOPY WITH PROPOFOL N/A 08/05/2015   Procedure: COLONOSCOPY WITH PROPOFOL;  Surgeon: Elnita Maxwell, MD;  Location: [facility] ENDOSCOPY;  Service: Endoscopy;  Laterality: N/A;   ESOPHAGOGASTRODUODENOSCOPY N/A 01/09/2022   Procedure: ESOPHAGOGASTRODUODENOSCOPY (EGD);  Surgeon: Regis Bill, MD;  Location: [facility] ENDOSCOPY;  Service: Endoscopy;  Laterality: N/A;   FOOT SURGERY Left    FRACTURE SURGERY     IR CV LINE INJECTION  06/22/2022   IR IMAGING GUIDED PORT INSERTION  06/05/2022   IR PORT REPAIR CENTRAL VENOUS ACCESS DEVICE  07/02/2022   ORIF ANKLE FRACTURE Left 03/26/2017   Procedure: OPEN REDUCTION INTERNAL FIXATION (ORIF) ANKLE FRACTURE;  Surgeon: Christena Flake, MD;  Location: [facility] ORS;  Service: Orthopedics;  Laterality: Left;    FAMILY HISTORY: Family History  Problem Relation Age of Onset   CVA Mother    Diabetes Mother    CAD Father    Breast cancer Neg Hx     ADVANCED DIRECTIVES (Y/N):  N  HEALTH MAINTENANCE: Social History   Tobacco Use   Smoking status: Former    Packs/day: 1.00    Years: 40.00    Additional pack years: 0.00    Total pack years: 40.00    Types: Cigarettes   Smokeless tobacco:  Never  Vaping Use   Vaping Use: Never used  Substance Use Topics   Alcohol use: No   Drug use: No     Colonoscopy:  PAP:  Bone density:  Lipid panel:  No Known Allergies  Current Outpatient Medications  Medication Sig Dispense Refill   albuterol (PROVENTIL HFA;VENTOLIN HFA) 108 (90 Base) MCG/ACT inhaler Inhale 2 puffs into the lungs every 6 (six) hours as needed for wheezing or shortness of breath.      amLODipine (NORVASC) 10 MG tablet Take 10 mg by mouth daily.     amoxicillin-clavulanate (AUGMENTIN) 875-125 MG tablet Take 1 tablet by mouth 2 (two) times daily for 5 days. 10 tablet 0   atorvastatin (LIPITOR) 80 MG tablet Take 80 mg by mouth daily.     buPROPion (WELLBUTRIN XL) 300 MG 24 hr tablet Take 300 mg by mouth daily.     cholecalciferol (VITAMIN D) 400 units TABS tablet Take 400 Units by mouth daily.     citalopram (CELEXA) 20 MG tablet Take 20 mg by mouth daily.     cloNIDine (CATAPRES) 0.2 MG tablet Take 0.2 mg by mouth 2 (two) times daily.     fluticasone-salmeterol (ADVAIR) 250-50 MCG/ACT AEPB Inhale 1 puff into the lungs in the morning and at bedtime.     glipiZIDE (GLUCOTROL XL) 10 MG 24 hr tablet Take 1 tablet by mouth daily.     insulin aspart (NOVOLOG) 100 UNIT/ML injection Inject 13 Units into the skin 3 (three) times daily before meals.     Insulin Degludec 100 UNIT/ML SOLN Inject into the skin.     insulin detemir (LEVEMIR FLEXPEN) 100 UNIT/ML FlexPen Inject 25 units in the morning. Inject 23 units in the evening.     ipratropium-albuterol (DUONEB) 0.5-2.5 (3) MG/3ML SOLN Take 3 mLs by nebulization every 6 (six) hours as needed. For shortness of breath 360 mL 1   lidocaine-prilocaine (EMLA) cream Apply to affected area once 30 g 3   losartan-hydrochlorothiazide (HYZAAR) 100-25 MG tablet Take 1 tablet by mouth daily.     metoprolol succinate (TOPROL-XL) 50 MG 24 hr tablet Take 1 tablet by mouth daily.     montelukast (SINGULAIR) 10 MG tablet Take 10 mg by mouth at bedtime.     ondansetron (ZOFRAN) 8 MG tablet Take by mouth.     ONETOUCH VERIO test strip 1 each 3 (three) times daily.     pantoprazole (PROTONIX) 20 MG tablet Take 20 mg by mouth daily.     prochlorperazine (COMPAZINE) 10 MG tablet Take 1 tablet (10 mg total) by mouth every 6 (six) hours as needed for nausea or vomiting. 60 tablet 2   No current facility-administered medications for this visit.    Facility-Administered Medications Ordered in Other Visits  Medication Dose Route Frequency Provider Last Rate Last Admin   CARBOplatin (PARAPLATIN) 490 mg in sodium chloride 0.9 % 250 mL chemo infusion  490 mg Intravenous Once Jeralyn Ruths, MD       heparin lock flush 100 unit/mL  500 Units Intracatheter Once PRN Jeralyn Ruths, MD       PACLitaxel (TAXOL) 348 mg in sodium chloride 0.9 % 500 mL chemo infusion (> 80mg /m2)  175 mg/m2 (Treatment Plan Recorded) Intravenous Once Jeralyn Ruths, MD 186 mL/hr at 10/03/22 1103 348 mg at 10/03/22 1103    OBJECTIVE: Vitals:   10/03/22 0858  BP: (!) 168/73  Pulse: (!) 57  Resp: 18  Temp: 97.8 F (36.6 C)  SpO2:  99%     Body mass index is 37.98 kg/m.    ECOG FS:0 - Asymptomatic  General: Well-developed, well-nourished, no acute distress. Eyes: Pink conjunctiva, anicteric sclera. HEENT: Normocephalic, moist mucous membranes. Lungs: No audible wheezing or coughing. Heart: Regular rate and rhythm. Abdomen: Soft, nontender, no obvious distention. Musculoskeletal: No edema, cyanosis, or clubbing. Neuro: Alert, answering all questions appropriately. Cranial nerves grossly intact. Skin: No rashes or petechiae noted. Psych: Normal affect.  LAB RESULTS:  Lab Results  Component Value Date   NA 142 10/03/2022   K 3.7 10/03/2022   CL 107 10/03/2022   CO2 29 10/03/2022   GLUCOSE 191 (H) 10/03/2022   BUN 23 10/03/2022   CREATININE 0.98 10/03/2022   CALCIUM 8.4 (L) 10/03/2022   PROT 5.7 (L) 10/03/2022   ALBUMIN 3.3 (L) 10/03/2022   AST 15 10/03/2022   ALT 18 10/03/2022   ALKPHOS 68 10/03/2022   BILITOT 0.4 10/03/2022   GFRNONAA >60 10/03/2022   GFRAA >60 03/27/2017    Lab Results  Component Value Date   WBC 6.3 10/03/2022   NEUTROABS 5.2 10/03/2022   HGB 8.3 (L) 10/03/2022   HCT 27.1 (L) 10/03/2022   MCV 96.4 10/03/2022   PLT 122 (L) 10/03/2022     STUDIES: MM 3D SCREEN BREAST BILATERAL  Result Date:  09/28/2022 CLINICAL DATA:  Screening. EXAM: DIGITAL SCREENING BILATERAL MAMMOGRAM WITH TOMOSYNTHESIS AND CAD TECHNIQUE: Bilateral screening digital craniocaudal and mediolateral oblique mammograms were obtained. Bilateral screening digital breast tomosynthesis was performed. The images were evaluated with computer-aided detection. COMPARISON:  Previous exam(s). ACR Breast Density Category b: There are scattered areas of fibroglandular density. FINDINGS: In the right breast, a possible asymmetry warrants further evaluation. In the left breast, no findings suspicious for malignancy. IMPRESSION: Further evaluation is suggested for possible asymmetry in the right breast. RECOMMENDATION: Diagnostic mammogram and possibly ultrasound of the right breast. (Code:[id number]) The patient will be contacted regarding the findings, and additional imaging will be scheduled. BI-RADS CATEGORY  0: Incomplete: Need additional imaging evaluation. Electronically Signed   By: Harmon Pier M.D.   On: 09/28/2022 11:03   DG Chest 2 View  Result Date: 09/28/2022 CLINICAL DATA:  shortness of breath; endometerial cancer; patient on keytruda - at risk for pneumonitis. EXAM: CHEST - 2 VIEW COMPARISON:  CT 08/10/2022, radiograph 08/05/2015 FINDINGS: Chest port catheter tip overlies the superior cavoatrial junction. Unchanged mildly enlarged cardiac silhouette. There are mild interstitial opacities and thickening of the minor fissure with pain lower lung nodular opacities and suspected trace pleural effusions. No evidence of pneumothorax. Unchanged appearance of the right lateral eighth rib in comparison to multiple prior CTs compatible with prior trauma. IMPRESSION: Mild interstitial opacities and thickening of the minor fissure with lower lung nodular opacities and suspected trace pleural effusions. Findings may represent pulmonary edema or atypical infectious/inflammatory process. Electronically Signed   By: Caprice Renshaw M.D.   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  CT scan results from May 15, 2022 reviewed independently with residual malignancy.  Repeat PET scan results from August 10, 2022 reviewed independently with significant improvement of disease burden.  Patient was once again evaluated by gyn-onc at that time and recommended 3 additional cycles of treatment followed by imaging  and reconsideration of surgical resection.  Once patient completes 6 cycles, will transition to maintenance Keytruda every 3 weeks.  Proceed with cycle 6 of treatment today.  Follow-up as scheduled for imaging and gyn-onc evaluation.  Return to clinic in 3 weeks for further evaluation and initiation of maintenance Keytruda.   Port: Port revision successful.  Proceed with treatment as above. Anemia: Hemoglobin has trended down to 8.3, monitor. Thrombocytopenia: Platelets slightly improved at 122, monitor. Hypertension: Blood pressure is moderate very elevated.  Continue monitoring and treatment per primary care. Claudication symptoms: Patient was previously given a referral to vascular surgery.   Patient expressed understanding and was in agreement with this plan. She also understands that She can call clinic at any time with any questions, concerns, or complaints.    Cancer Staging  Endometrial cancer [facility]) Staging form: Corpus Uteri - Carcinoma and Carcinosarcoma, AJCC 8th Edition - Clinical stage from 05/30/2022: FIGO Stage IIIB (cT3b, cN0, cM0) - Signed by Jeralyn Ruths, MD on 05/30/2022 Stage prefix: Initial diagnosis   Jeralyn Ruths, MD   10/03/2022 1:25 PM

## 2022-10-04 ENCOUNTER — Other Ambulatory Visit: Payer: Self-pay | Admitting: Oncology

## 2022-10-04 ENCOUNTER — Other Ambulatory Visit: Payer: Self-pay

## 2022-10-05 ENCOUNTER — Other Ambulatory Visit: Payer: Self-pay

## 2022-10-05 ENCOUNTER — Inpatient Hospital Stay: Payer: PPO

## 2022-10-05 DIAGNOSIS — C541 Malignant neoplasm of endometrium: Secondary | ICD-10-CM

## 2022-10-05 DIAGNOSIS — Z5111 Encounter for antineoplastic chemotherapy: Secondary | ICD-10-CM | POA: Diagnosis not present

## 2022-10-05 MED ORDER — PEGFILGRASTIM-CBQV 6 MG/0.6ML ~~LOC~~ SOSY
6.0000 mg | PREFILLED_SYRINGE | Freq: Once | SUBCUTANEOUS | Status: AC
  Administered 2022-10-05: 6 mg via SUBCUTANEOUS
  Filled 2022-10-05: qty 0.6

## 2022-10-08 ENCOUNTER — Inpatient Hospital Stay: Payer: PPO

## 2022-10-08 ENCOUNTER — Other Ambulatory Visit: Payer: Self-pay | Admitting: Oncology

## 2022-10-08 ENCOUNTER — Encounter: Payer: Self-pay | Admitting: Oncology

## 2022-10-08 ENCOUNTER — Other Ambulatory Visit: Payer: Self-pay

## 2022-10-08 ENCOUNTER — Ambulatory Visit
Admission: RE | Admit: 2022-10-08 | Discharge: 2022-10-08 | Disposition: A | Discharge status: Home / Self Care | Payer: PPO | Source: Ambulatory Visit | Attending: Oncology | Admitting: Oncology

## 2022-10-08 DIAGNOSIS — J90 Pleural effusion, not elsewhere classified: Secondary | ICD-10-CM | POA: Diagnosis not present

## 2022-10-08 DIAGNOSIS — C541 Malignant neoplasm of endometrium: Secondary | ICD-10-CM | POA: Insufficient documentation

## 2022-10-08 DIAGNOSIS — K760 Fatty (change of) liver, not elsewhere classified: Secondary | ICD-10-CM | POA: Diagnosis not present

## 2022-10-08 DIAGNOSIS — N281 Cyst of kidney, acquired: Secondary | ICD-10-CM | POA: Diagnosis not present

## 2022-10-08 MED ORDER — IOHEXOL 300 MG/ML  SOLN
100.0000 mL | Freq: Once | INTRAMUSCULAR | Status: AC | PRN
  Administered 2022-10-08: 100 mL via INTRAVENOUS

## 2022-10-08 NOTE — Progress Notes (Signed)
Spoke with Jillene about the [employer], she will turn in her information this week.

## 2022-10-08 NOTE — Progress Notes (Signed)
OFF PATHWAY REGIMEN - Uterine  No Change  Continue With Treatment as Ordered.  Original Decision Date/Time: 05/30/2022 10:09   [id number]:Carboplatin AUC=5 IV D1 + Paclitaxel 175 mg/m2 IV D1 + Pembrolizumab 200 mg IV D1 q21 Days x 6 Cycles Followed by Pembrolizumab 400 mg IV D1 q42 Days:   Cycles 1 through 6: A cycle is every 21 days:     Pembrolizumab      Paclitaxel      Carboplatin    Cycles 7 and beyond: A cycle is every 42 days:     Pembrolizumab   **Always confirm dose/schedule in your pharmacy ordering system**  Patient Characteristics: Serous Carcinoma, Newly Diagnosed (Clinical Staging), Nonsurgical Candidate, Stage III or IV, HER2 Negative/Unknown, MSS/pMMR Histology: Serous Carcinoma Therapeutic Status: Newly Diagnosed (Clinical Staging) AJCC M Category: cM0 Surgical Candidacy: Nonsurgical Candidate AJCC 8 Stage Grouping: IIIB AJCC T Category: cTX AJCC N Category: cNX HER2 Status: Negative Microsatellite/Mismatch Repair Status: MSS/pMMR Intent of Therapy: Curative Intent, Discussed with Patient

## 2022-10-08 NOTE — Progress Notes (Signed)
DISCONTINUE OFF PATHWAY REGIMEN - Uterine   [id number]:Carboplatin AUC=5 IV D1 + Paclitaxel 175 mg/m2 IV D1 + Pembrolizumab 200 mg IV D1 q21 Days x 6 Cycles Followed by Pembrolizumab 400 mg IV D1 q42 Days:   Cycles 1 through 6: A cycle is every 21 days:     Pembrolizumab      Paclitaxel      Carboplatin    Cycles 7 and beyond: A cycle is every 42 days:     Pembrolizumab   **Always confirm dose/schedule in your pharmacy ordering system**  REASON: Other Reason PRIOR TREATMENT: Off Pathway: Carboplatin AUC=5 IV D1 + Paclitaxel 175 mg/m2 IV D1 + Pembrolizumab 200 mg IV D1 q21 Days x 6 Cycles Followed by Pembrolizumab 400 mg IV D1 q42 Days TREATMENT RESPONSE: Partial Response (PR)  START OFF PATHWAY REGIMEN - Uterine   [id number]:Pembrolizumab 200 mg IV D1 q21 Days:   A cycle is every 21 days:     Pembrolizumab   **Always confirm dose/schedule in your pharmacy ordering system**  Patient Characteristics: Serous Carcinoma, Newly Diagnosed (Clinical Staging), Nonsurgical Candidate, Stage III or IV, HER2 Negative/Unknown, MSS/pMMR Histology: Serous Carcinoma Therapeutic Status: Newly Diagnosed (Clinical Staging) AJCC M Category: cM0 Surgical Candidacy: Nonsurgical Candidate AJCC 8 Stage Grouping: IIIB AJCC T Category: cTX AJCC N Category: cNX HER2 Status: Negative Microsatellite/Mismatch Repair Status: MSS/pMMR Intent of Therapy: Curative Intent, Discussed with Patient

## 2022-10-09 ENCOUNTER — Other Ambulatory Visit: Payer: Self-pay

## 2022-10-09 ENCOUNTER — Other Ambulatory Visit: Payer: Self-pay | Admitting: Oncology

## 2022-10-10 ENCOUNTER — Ambulatory Visit: Payer: PPO

## 2022-10-11 ENCOUNTER — Ambulatory Visit
Admission: RE | Admit: 2022-10-11 | Discharge: 2022-10-11 | Disposition: A | Discharge status: Home / Self Care | Payer: PPO | Source: Ambulatory Visit | Attending: Internal Medicine | Admitting: Internal Medicine

## 2022-10-11 DIAGNOSIS — R928 Other abnormal and inconclusive findings on diagnostic imaging of breast: Secondary | ICD-10-CM

## 2022-10-11 DIAGNOSIS — N6489 Other specified disorders of breast: Secondary | ICD-10-CM

## 2022-10-13 ENCOUNTER — Other Ambulatory Visit: Payer: Self-pay

## 2022-10-15 ENCOUNTER — Other Ambulatory Visit: Payer: Self-pay

## 2022-10-15 DIAGNOSIS — I10 Essential (primary) hypertension: Secondary | ICD-10-CM | POA: Diagnosis not present

## 2022-10-15 DIAGNOSIS — Z794 Long term (current) use of insulin: Secondary | ICD-10-CM | POA: Diagnosis not present

## 2022-10-15 DIAGNOSIS — E118 Type 2 diabetes mellitus with unspecified complications: Secondary | ICD-10-CM | POA: Diagnosis not present

## 2022-10-15 DIAGNOSIS — E782 Mixed hyperlipidemia: Secondary | ICD-10-CM | POA: Diagnosis not present

## 2022-10-15 DIAGNOSIS — I350 Nonrheumatic aortic (valve) stenosis: Secondary | ICD-10-CM | POA: Diagnosis not present

## 2022-10-15 DIAGNOSIS — J4489 Other specified chronic obstructive pulmonary disease: Secondary | ICD-10-CM | POA: Diagnosis not present

## 2022-10-15 DIAGNOSIS — Z79899 Other long term (current) drug therapy: Secondary | ICD-10-CM | POA: Diagnosis not present

## 2022-10-17 ENCOUNTER — Inpatient Hospital Stay: Payer: PPO

## 2022-10-17 ENCOUNTER — Inpatient Hospital Stay (HOSPITAL_BASED_OUTPATIENT_CLINIC_OR_DEPARTMENT_OTHER): Payer: PPO | Admitting: Obstetrics and Gynecology

## 2022-10-17 VITALS — BP 186/42 | HR 57 | Temp 96.8°F | Resp 19 | Wt 200.0 lb

## 2022-10-17 DIAGNOSIS — C541 Malignant neoplasm of endometrium: Secondary | ICD-10-CM

## 2022-10-17 DIAGNOSIS — Z5111 Encounter for antineoplastic chemotherapy: Secondary | ICD-10-CM | POA: Diagnosis not present

## 2022-10-17 NOTE — Progress Notes (Signed)
Gynecologic Oncology Consult Visit   Referring Provider: Dr. 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 female who is seen in consultation from Dr. Jean Rosenthal for locally advanced endometrial cancer.  After pelvic radiation for extensive vaginal involvement she received six cycles of carboplatin/taxol/keytruda with Dr Orlie Dakin.  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  CT scan results from May 15, 2022 with residual malignancy.  Repeat PET scan results from August 10, 2022 reviewed independently with significant improvement of disease burden.  Patient was once again evaluated by gyn-onc at that time and recommended 3 additional cycles of treatment followed by imaging and reconsideration of surgical resection.  Once patient completes 6 cycles, will transition to maintenance Keytruda every 3 weeks.  Proceed with cycle 6 of treatment today.  Follow-up as scheduled for imaging and gyn-onc evaluation.   Port: Port revision successful.    Anemia: Hemoglobin has trended down to 8.3, monitor. Thrombocytopenia: Platelets slightly improved at 122, monitor. Hypertension: Blood pressure is moderate very elevated.  Continue monitoring and treatment per primary care. Claudication symptoms: Patient was previously given a referral to vascular surgery.  CT scan 10/08/22 FINDINGS: CT CHEST FINDINGS Cardiovascular: Right chest port catheter. Aortic atherosclerosis. Normal heart size. Left coronary artery calcifications. No pericardial effusion.   Mediastinum/Nodes: No enlarged mediastinal, hilar, or axillary lymph nodes. Small hiatal hernia. Thyroid gland, trachea, and esophagus demonstrate no significant findings.   Lungs/Pleura:  Evaluation of the lungs limited by breath motion artifact throughout. Small bilateral pleural effusions, diffuse bilateral bronchial wall thickening, mild interlobular septal thickening, and diffuse ground-glass attenuation throughout the lungs. Occasional tiny pulmonary nodules are unchanged, many of which appear calcified. Example noncalcified nodules include a 0.2 cm nodule of the peripheral left upper lobe (series 5, image 29) and a 0.3 cm nodule of the superior segment left lower lobe (series 5, image 61).   Musculoskeletal: No chest wall abnormality. No acute osseous findings.   CT ABDOMEN PELVIS FINDINGS  Hepatobiliary: No solid liver abnormality is seen. Hepatic steatosis. No gallstones, gallbladder wall thickening, or biliary dilatation.   Pancreas: Unremarkable. No pancreatic ductal dilatation or surrounding inflammatory changes.   Spleen: Normal in size. Multiple unchanged low-attenuation lesions throughout the spleen, largest measuring 1.5 x 1.2 cm, not previously FDG avid (series 3, image 43)   Adrenals/Urinary Tract: Unchanged, definitively benign fat containing right adrenal adenoma, not previously PET avid, for which no specific further follow-up or characterization is required (series 3, image 51). Simple, benign left renal cortical cyst, for which no further follow-up or characterization is required. Kidneys are otherwise normal, without renal calculi, solid lesion, or hydronephrosis. Bladder is unremarkable.   Stomach/Bowel: Stomach is within normal limits. Appendix appears normal. Long segment wall thickening of the distal ileum, which is closely adjacent to the uterus (series 3, image 100). Sigmoid diverticula.   Vascular/Lymphatic: Aortic atherosclerosis. Unchanged prominent left and right common iliac lymph nodes, largest on the left measuring 1.4 x 0.9 cm (series 3, image 70). No other enlarged abdominal or pelvic lymph nodes.   Reproductive:  Unchanged appearance of the uterus (series 3, image 104). Interval development of perirectal and presacral fat stranding in the low pelvis, likely sequelae of local radiation therapy.   Other: No abdominal wall hernia or abnormality.  No ascites.   Musculoskeletal: No acute osseous findings.   IMPRESSION: 1. Unchanged post treatment appearance of the uterus. 2. Unchanged prominent left and right common iliac lymph nodes, not previously FDG avid and most likely reactive. Continued attention on follow-up. No other enlarged abdominal or pelvic lymph nodes. 3. Interval development of perirectal and presacral fat stranding in the low pelvis, consistent with sequelae of local radiation therapy. 4. Long segment wall thickening of the distal ileum, which is closely adjacent to the uterus. As above this most likely reflects sequelae of local radiation therapy. 5. Small bilateral pleural effusions, diffuse bilateral bronchial wall thickening, mild interlobular septal thickening, and diffuse ground-glass attenuation throughout the lungs. Findings are consistent with pulmonary edema. 6. Occasional tiny pulmonary nodules are unchanged, many of which appear calcified, most likely benign incidental sequelae of prior infection or inflammation. Attention on follow-up. 7. Hepatic steatosis. 8. Coronary artery disease.  Gyn Oncology History She was referred by Dr. Judithann Sheen to Dr. Jean Rosenthal for reports of postmenopausal bleeding for 6 months, may be more.  Scant.  Became heavier. Additionally has urinary incontinence.    On exam, friable cervical vs vaginal mass was partially visualized though exam was limited. Biopsy was performed.   Cervical Biopsy: poorly differentiated carcinoma, favor endometrial origin. Positive for p53 and ER. Focal staining for p16, negative for napsin and p63. HER2- 1+ negative. MSI/MMR- Stable  Colonoscopy was scheduled but prep inadequate.    PET/CT EXAM: 10/23 Marked diffuse  hypermetabolic activity throughout the entire uterus and cervix, consistent with malignancy. This favors endometrial carcinoma over cervical carcinoma. Borderline enlarged bilateral common iliac lymph nodes noted, but without FDG uptake. No definite evidence of metastatic disease by PET.  Patient seen 01/24/22 and found to have extensive cervical and upper vaginal involvement.  Only having light bleeding.  Decision made to treat her with primary radiation therapy because of extensive involvement of cervix and adjacent vagina, and she was reluctant to consider chemotherapy.  She received primary pelvic external radiation 50 Gy, completed 04/18/22.   05/15/22- CT Chest abdomen Pelvis w contrast IMPRESSION: 1. Central uterine hypoattenuation likely represents residual endometrial primary. 2. Borderline sized common iliac nodes are similar to the prior PET and were not hypermetabolic on that exam. Favored to be reactive. Otherwise, no evidence of metastatic disease in the abdomen or pelvis. 3. Scattered tiny pulmonary nodules are subpleural predominant and favored to represent subpleural lymph nodes. These can be re-evaluated at follow-up. 4.  Tiny hiatal hernia. 5. Interval nonacute anterior left third and fourth rib fractures. 6. Aortic atherosclerosis (ICD10-I70.0) and emphysema (ICD10-J43.9).  Treated with Carboplatin/Taxol and Keytruda x 3 cycles with excellent clinical response clinically.   CT/PET 4/24 shows response in the uterus.  See below. CHEST: No hypermetabolic mediastinal or hilar nodes. No suspicious pulmonary nodules on the CT scan.   Incidental CT findings: Aortic atherosclerosis and coronary artery calcifications.   ABDOMEN/PELVIS: No abnormal tracer uptake identified within the liver, pancreas, spleen, or adrenal glands. Index left common iliac lymph node measures 1 cm with SUV max of 2.04. Previously this measured 1.0 cm with SUV max of 1.2. Previous right common  iliac node measures 7 mm with SUV max of 1.99. Previously this measured 8 mm with SUV max of 1.02. Interval decrease in diffusely increased radiotracer uptake throughout the uterus. SUV max on the current exam is equal to 4.22 versus 21.8 previously.   Incidental CT findings: Small hiatal hernia. Aortic atherosclerosis.   SKELETON: No focal hypermetabolic activity  to suggest skeletal metastasis.   Incidental CT findings: Diffusely increased bone marrow activity is new compared with the previous exam and likely reflects treatment related changes.   IMPRESSION: 1. Interval decrease in radiotracer uptake associated with the uterus compatible with response to therapy. SUV max is currently equal to 4.22, compared with 21.8 previously. 2. No significant change in size of bilateral common iliac lymph nodes with mild, nonspecific FDG uptake. 3. No new sites of disease identified. 4. Diffusely increased bone marrow activity is new compared with the previous exam and likely reflects treatment related changes.   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   Hyperlipemia, mixed 04/05/2015   HTN, goal below 140/80 04/05/2015   Obesity (BMI 30-39.9) 04/05/2015   Type 2 diabetes mellitus with complication, with long-term current use of insulin (HCC) 04/05/2015   Class 2 severe obesity due to excess calories with serious comorbidity in adult (HCC) 08/19/2013   Obesity, Class II, BMI 35-39.9, with comorbidity 08/19/2013   Chronic obstructive pulmonary disease, unspecified (HCC) 07/31/2012   Claudication (HCC) 03/12/2012   Tobacco use 03/12/2012   Hyperlipidemia associated with type  2 diabetes mellitus (HCC) 06/06/2011   Hypertension associated with diabetes (HCC) 06/06/2011   Major depression 06/06/2011   Type 2 diabetes mellitus without complication (HCC) 06/06/2011    Past Medical History: Past Medical History:  Diagnosis Date   Adenomatous colon polyp    Aortic stenosis    Arthritis    Asthma    Claudication (HCC)    COPD (chronic obstructive pulmonary disease) (HCC)    Depression    Diabetes mellitus without complication (HCC)    Esophageal dysphagia    GERD (gastroesophageal reflux disease)    Hyperlipemia    Hypertension    Obesity    Severe obesity (BMI 35.0-35.9 with comorbidity) (HCC)     Past Surgical History: Past Surgical History:  Procedure Laterality Date   BLADDER SURGERY     COLONOSCOPY WITH PROPOFOL N/A 08/05/2015   Procedure: COLONOSCOPY WITH PROPOFOL;  Surgeon: Elnita Maxwell, MD;  Location: [facility] ENDOSCOPY;  Service: Endoscopy;  Laterality: N/A;   ESOPHAGOGASTRODUODENOSCOPY N/A 01/09/2022   Procedure: ESOPHAGOGASTRODUODENOSCOPY (EGD);  Surgeon: Regis Bill, MD;  Location: [facility] ENDOSCOPY;  Service: Endoscopy;  Laterality: N/A;   FOOT SURGERY Left    FRACTURE SURGERY     IR CV LINE INJECTION  06/22/2022   IR IMAGING GUIDED PORT INSERTION  06/05/2022   IR PORT REPAIR CENTRAL VENOUS ACCESS DEVICE  07/02/2022   ORIF ANKLE FRACTURE Left 03/26/2017   Procedure: OPEN REDUCTION INTERNAL FIXATION (ORIF) ANKLE FRACTURE;  Surgeon: Christena Flake, MD;  Location: [facility] ORS;  Service: Orthopedics;  Laterality: Left;    OB History:  G17P0 OB History  No obstetric history on file.    Family History: Family History  Problem Relation Age of Onset   CVA Mother    Diabetes Mother    CAD Father    Breast cancer Neg Hx     Social History: Social History   Socioeconomic History   Marital status: Single    Spouse name: Not on file   Number of children: Not on file   Years of  education: Not on file   Highest education level: Not on  file  Occupational History   Not on file  Tobacco Use   Smoking status: Former    Packs/day: 1.00    Years: 40.00    Additional pack years: 0.00    Total pack years: 40.00    Types: Cigarettes   Smokeless tobacco: Never  Vaping Use   Vaping Use: Never used  Substance and Sexual Activity   Alcohol use: No   Drug use: No   Sexual activity: Not on file  Other Topics Concern   Not on file  Social History Narrative   Not on file   Social Determinants of Health   Financial Resource Strain: Low Risk  (09/20/2022)   Overall Financial Resource Strain (CARDIA)    Difficulty of Paying Living Expenses: Not very hard  Food Insecurity: No Food Insecurity (05/30/2022)   Hunger Vital Sign    Worried About Running Out of Food in the Last Year: Never true    Ran Out of Food in the Last Year: Never true  Transportation Needs: Unmet Transportation Needs (09/12/2022)   PRAPARE - Transportation    Lack of Transportation (Medical): Yes    Lack of Transportation (Non-Medical): Yes  Physical Activity: Inactive (09/20/2022)   Exercise Vital Sign    Days of Exercise per Week: 0 days    Minutes of Exercise per Session: 0 min  Stress: Stress Concern Present (09/20/2022)   [employer] of Occupational Health - Occupational Stress Questionnaire    Feeling of Stress : To some extent  Social Connections: Socially Isolated (09/20/2022)   Social Connection and Isolation Panel [NHANES]    Frequency of Communication with Friends and Family: Once a week    Frequency of Social Gatherings with Friends and Family: Never    Attends Religious Services: Never    Database administrator or Organizations: No    Attends Banker Meetings: Never    Marital Status: Never married  Intimate Partner Violence: Not At Risk (05/30/2022)   Humiliation, Afraid, Rape, and Kick questionnaire    Fear of Current or Ex-Partner: No    Emotionally Abused: No    Physically Abused: No    Sexually Abused: No     Allergies: No Known Allergies  Current Medications: Current Outpatient Medications  Medication Sig Dispense Refill   albuterol (PROVENTIL HFA;VENTOLIN HFA) 108 (90 Base) MCG/ACT inhaler Inhale 2 puffs into the lungs every 6 (six) hours as needed for wheezing or shortness of breath.     amLODipine (NORVASC) 10 MG tablet Take 10 mg by mouth daily.     atorvastatin (LIPITOR) 80 MG tablet Take 80 mg by mouth daily.     buPROPion (WELLBUTRIN XL) 300 MG 24 hr tablet Take 300 mg by mouth daily.     cholecalciferol (VITAMIN D) 400 units TABS tablet Take 400 Units by mouth daily.     citalopram (CELEXA) 20 MG tablet Take 20 mg by mouth daily.     cloNIDine (CATAPRES) 0.2 MG tablet Take 0.2 mg by mouth 2 (two) times daily.     fluticasone-salmeterol (ADVAIR) 250-50 MCG/ACT AEPB Inhale 1 puff into the lungs in the morning and at bedtime.     glipiZIDE (GLUCOTROL XL) 10 MG 24 hr tablet Take 1 tablet by mouth daily.     insulin aspart (NOVOLOG) 100 UNIT/ML injection Inject 13 Units into the skin 3 (three) times daily before meals.     Insulin Degludec 100 UNIT/ML  SOLN Inject into the skin.     insulin detemir (LEVEMIR FLEXPEN) 100 UNIT/ML FlexPen Inject 25 units in the morning. Inject 23 units in the evening.     ipratropium-albuterol (DUONEB) 0.5-2.5 (3) MG/3ML SOLN Take 3 mLs by nebulization every 6 (six) hours as needed. For shortness of breath 360 mL 1   losartan-hydrochlorothiazide (HYZAAR) 100-25 MG tablet Take 1 tablet by mouth daily.     metoprolol succinate (TOPROL-XL) 50 MG 24 hr tablet Take 1 tablet by mouth daily.     montelukast (SINGULAIR) 10 MG tablet Take 10 mg by mouth at bedtime.     ondansetron (ZOFRAN) 8 MG tablet Take by mouth.     ONETOUCH VERIO test strip 1 each 3 (three) times daily.     pantoprazole (PROTONIX) 20 MG tablet Take 20 mg by mouth daily.     No current facility-administered medications for this visit.    Review of Systems General:  no complaints Skin: no  complaints Eyes: no complaints HEENT: no complaints Breasts: no complaints Pulmonary: no complaints Cardiac: no complaints Gastrointestinal: no complaints Ob/Gyn: no complaints Musculoskeletal: no complaints Hematology: no complaints Neurologic/Psych: depression   Objective:  Physical Examination:  BP (!) 186/42   Pulse (!) 57   Temp (!) 96.8 F (36 C)   Resp 19   Wt 200 lb (90.7 kg)   SpO2 98%   BMI 37.79 kg/m     ECOG Performance Status: 1 - Symptomatic but completely ambulatory  GENERAL: Patient is a well appearing female in no acute distress HEENT:  Sclera clear. Anicteric NODES:  Negative axillary, supraclavicular, inguinal lymph node survery LUNGS:  Clear to auscultation bilaterally.   HEART:  Regular rate and rhythm.  ABDOMEN:  Soft, nontender.  No hernias, incisions well healed. No masses or ascites EXTREMITIES:  No peripheral edema. Atraumatic. No cyanosis SKIN:  Clear with no obvious rashes or skin changes.  NEURO:  Nonfocal. Well oriented.  Appropriate affect.  Pelvic exam: Chaperoned by CMA EGBUS: no lesions Vagina: narrow with just minor irregularity of the cervix.  Significant thickening, radiation effect Uterus: normal size, nontender, mobile Adnexa: no palpable masses Rectovaginal: confirmatory  Lab Review Lab Results  Component Value Date   WBC 6.3 10/03/2022   HGB 8.3 (L) 10/03/2022   HCT 27.1 (L) 10/03/2022   MCV 96.4 10/03/2022   PLT 122 (L) 10/03/2022     Chemistry      Component Value Date/Time   NA 142 10/03/2022 0818   K 3.7 10/03/2022 0818   CL 107 10/03/2022 0818   CO2 29 10/03/2022 0818   BUN 23 10/03/2022 0818   CREATININE 0.98 10/03/2022 0818   CREATININE 0.85 09/28/2022 1019      Component Value Date/Time   CALCIUM 8.4 (L) 10/03/2022 0818   ALKPHOS 68 10/03/2022 0818   AST 15 10/03/2022 0818   AST 16 09/28/2022 1019   ALT 18 10/03/2022 0818   ALT 22 09/28/2022 1019   BILITOT 0.4 10/03/2022 0818   BILITOT 0.7  09/28/2022 1019        Assessment:  Deliza Billings is a 72 y.o. 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  PET scan shows involvement of entire uterus and cervix.  No distant disease. Bilateral common iliac nodes are borderline enlarged, but not PET positive.   Poorly differentiated cancer with TP53 overexpression and negative HPV test so unlikely to be cervical primary. She was hesitant for chemotherapy and  elected for radiation in view of her locally advanced disease.  Completed radiation 04/18/22 and on exam still had fleshy cancer involving upper vagina and flush cervix.  CT scan continues to show hypoattenuation of uterus cw persistent disease, but no evidence of distant disease.  Discussed that she was not a surgical candidate due to extensive upper vaginal involvement with cancer and decision made to start systemic therapy.  She received three cycles of carboplatin/taxol/keytruda and PET/CT 4/24 shows considerable decrease SUV in the uterus and no distant disease.  On exam 08/15/22 the vaginal disease has regressed dramatically. Completed 6 cycles of carboplain/taxol/keytruda 10/04/22.  CT scan shows no evidence of residual disease.   Cancer is TP53 mutated, MSS and HER2 negative.   Medical co-morbidities complicating care: obesity, COPD, aortic stenosis, PVD.  Plan:   Problem List Items Addressed This Visit       Genitourinary   Endometrial cancer (HCC) - Primary    Recommend maintenance Keytruda immunotherapy with Dr. Orlie Dakin.  Repeat PET imaging in 2-3 months and can consider the option of completion surgery to remove the uterus and ovaries if there is persistent PET positivity, but CT scan recently is reassuring.  She had extensive vaginal disease and now this has regressed, but there is significant radiation effect in the vagina, so surgery could be problematic in terms of healing.  And she also has  significant medical comorbidities that would increase perioperative risk. I discussed this plan with Dr Orlie Dakin and he is in agreement.   The patient's diagnosis, an outline of the further diagnostic and laboratory studies which will be required, the recommendation for surgery, and alternatives were discussed with her and her accompanying family members.  All questions were answered to their satisfaction.  I personally interviewed and examined the patient. Agreed with the above/below plan of care. I have directly contributed to assessment and plan of care of this patient and educated and discussed with patient and family.  Leida Lauth, MD  CC:  Judithann Sheen Duane Lope, MD [address] [facility] [place],  [place] [address] [phone]

## 2022-10-18 ENCOUNTER — Other Ambulatory Visit: Payer: Self-pay

## 2022-10-23 ENCOUNTER — Encounter: Payer: Self-pay | Admitting: Nurse Practitioner

## 2022-10-23 ENCOUNTER — Inpatient Hospital Stay: Payer: PPO

## 2022-10-23 ENCOUNTER — Inpatient Hospital Stay (HOSPITAL_BASED_OUTPATIENT_CLINIC_OR_DEPARTMENT_OTHER): Payer: PPO | Admitting: Nurse Practitioner

## 2022-10-23 VITALS — BP 140/100 | HR 68 | Temp 97.8°F | Resp 18 | Ht 61.0 in | Wt 196.6 lb

## 2022-10-23 VITALS — BP 145/55

## 2022-10-23 DIAGNOSIS — Z5111 Encounter for antineoplastic chemotherapy: Secondary | ICD-10-CM | POA: Diagnosis not present

## 2022-10-23 DIAGNOSIS — C541 Malignant neoplasm of endometrium: Secondary | ICD-10-CM

## 2022-10-23 DIAGNOSIS — T451X5A Adverse effect of antineoplastic and immunosuppressive drugs, initial encounter: Secondary | ICD-10-CM | POA: Diagnosis not present

## 2022-10-23 DIAGNOSIS — D6481 Anemia due to antineoplastic chemotherapy: Secondary | ICD-10-CM

## 2022-10-23 DIAGNOSIS — Z5112 Encounter for antineoplastic immunotherapy: Secondary | ICD-10-CM

## 2022-10-23 LAB — CBC WITH DIFFERENTIAL (CANCER CENTER ONLY)
Abs Immature Granulocytes: 0.02 10*3/uL (ref 0.00–0.07)
Basophils Absolute: 0 10*3/uL (ref 0.0–0.1)
Basophils Relative: 0 %
Eosinophils Absolute: 0 10*3/uL (ref 0.0–0.5)
Eosinophils Relative: 0 %
HCT: 27.3 % — ABNORMAL LOW (ref 36.0–46.0)
Hemoglobin: 8.4 g/dL — ABNORMAL LOW (ref 12.0–15.0)
Immature Granulocytes: 0 %
Lymphocytes Relative: 6 %
Lymphs Abs: 0.3 10*3/uL — ABNORMAL LOW (ref 0.7–4.0)
MCH: 30.1 pg (ref 26.0–34.0)
MCHC: 30.8 g/dL (ref 30.0–36.0)
MCV: 97.8 fL (ref 80.0–100.0)
Monocytes Absolute: 0.5 10*3/uL (ref 0.1–1.0)
Monocytes Relative: 11 %
Neutro Abs: 3.8 10*3/uL (ref 1.7–7.7)
Neutrophils Relative %: 83 %
Platelet Count: 130 10*3/uL — ABNORMAL LOW (ref 150–400)
RBC: 2.79 MIL/uL — ABNORMAL LOW (ref 3.87–5.11)
RDW: 19.6 % — ABNORMAL HIGH (ref 11.5–15.5)
WBC Count: 4.7 10*3/uL (ref 4.0–10.5)
nRBC: 0 % (ref 0.0–0.2)

## 2022-10-23 LAB — CMP (CANCER CENTER ONLY)
ALT: 13 U/L (ref 0–44)
AST: 15 U/L (ref 15–41)
Albumin: 3.7 g/dL (ref 3.5–5.0)
Alkaline Phosphatase: 76 U/L (ref 38–126)
Anion gap: 9 (ref 5–15)
BUN: 25 mg/dL — ABNORMAL HIGH (ref 8–23)
CO2: 27 mmol/L (ref 22–32)
Calcium: 8.5 mg/dL — ABNORMAL LOW (ref 8.9–10.3)
Chloride: 105 mmol/L (ref 98–111)
Creatinine: 1.07 mg/dL — ABNORMAL HIGH (ref 0.44–1.00)
GFR, Estimated: 55 mL/min — ABNORMAL LOW (ref >60)
Glucose, Bld: 156 mg/dL — ABNORMAL HIGH (ref 70–99)
Potassium: 4.1 mmol/L (ref 3.5–5.1)
Sodium: 141 mmol/L (ref 135–145)
Total Bilirubin: 0.6 mg/dL (ref 0.3–1.2)
Total Protein: 6 g/dL — ABNORMAL LOW (ref 6.5–8.1)

## 2022-10-23 LAB — TSH: TSH: 2.991 u[IU]/mL (ref 0.350–4.500)

## 2022-10-23 MED ORDER — HEPARIN SOD (PORK) LOCK FLUSH 100 UNIT/ML IV SOLN
500.0000 [IU] | Freq: Once | INTRAVENOUS | Status: AC | PRN
  Administered 2022-10-23: 500 [IU]
  Filled 2022-10-23: qty 5

## 2022-10-23 MED ORDER — SODIUM CHLORIDE 0.9 % IV SOLN
200.0000 mg | Freq: Once | INTRAVENOUS | Status: AC
  Administered 2022-10-23: 200 mg via INTRAVENOUS
  Filled 2022-10-23: qty 200

## 2022-10-23 MED ORDER — SODIUM CHLORIDE 0.9 % IV SOLN
Freq: Once | INTRAVENOUS | Status: AC
  Filled 2022-10-23: qty 250

## 2022-10-23 NOTE — Patient Instructions (Signed)
[facility]  Discharge Instructions: Thank you for choosing [facility] to provide your oncology and hematology care.  If you have a lab appointment with the Cancer Center, please go directly to the Cancer Center and check in at the registration area.  Wear comfortable clothing and clothing appropriate for easy access to any Portacath or PICC line.   We strive to give you quality time with your provider. You may need to reschedule your appointment if you arrive late (15 or more minutes).  Arriving late affects you and other patients whose appointments are after yours.  Also, if you miss three or more appointments without notifying the office, you may be dismissed from the clinic at the provider's discretion.      For prescription refill requests, have your pharmacy contact our office and allow 72 hours for refills to be completed.     To help prevent nausea and vomiting after your treatment, we encourage you to take your nausea medication as directed.  BELOW ARE SYMPTOMS THAT SHOULD BE REPORTED IMMEDIATELY: *FEVER GREATER THAN 100.4 F (38 C) OR HIGHER *CHILLS OR SWEATING *NAUSEA AND VOMITING THAT IS NOT CONTROLLED WITH YOUR NAUSEA MEDICATION *UNUSUAL SHORTNESS OF BREATH *UNUSUAL BRUISING OR BLEEDING *URINARY PROBLEMS (pain or burning when urinating, or frequent urination) *BOWEL PROBLEMS (unusual diarrhea, constipation, pain near the anus) TENDERNESS IN MOUTH AND THROAT WITH OR WITHOUT PRESENCE OF ULCERS (sore throat, sores in mouth, or a toothache) UNUSUAL RASH, SWELLING OR PAIN  UNUSUAL VAGINAL DISCHARGE OR ITCHING   Items with * indicate a potential emergency and should be followed up as soon as possible or go to the Emergency Department if any problems should occur.  Please show the CHEMOTHERAPY ALERT CARD or IMMUNOTHERAPY ALERT CARD at check-in to the Emergency Department and triage nurse.  Should you have questions after your visit  or need to cancel or reschedule your appointment, please contact [facility]  [phone] and follow the prompts.  Office hours are 8:00 a.m. to 4:30 p.m. Monday - Friday. Please note that voicemails left after 4:00 p.m. may not be returned until the following business day.  We are closed weekends and major holidays. You have access to a nurse at all times for urgent questions. Please call the main number to the clinic [phone] and follow the prompts.  For any non-urgent questions, you may also contact your provider using MyChart. We now offer e-Visits for anyone 18 and older to request care online for non-urgent symptoms. For details visit [email or web address].   Also download the MyChart app! Go to the app store, search "MyChart", open the app, select [facility], and log in with your MyChart username and password.    

## 2022-10-23 NOTE — Telephone Encounter (Signed)
Reviewed chart- pt is now arrived.

## 2022-10-23 NOTE — Progress Notes (Unsigned)
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  INTERVAL HISTORY: Patient returns to clinic today for further evaluation and consideration of maintenance [place]. She continues to feel generally weakened since completing chemotherapy. Has some numbness and tingling of her hands and feet at times but has mostly resolved. Appetite is improving. No nausea, vomiting, or diarrhea. No skin rashes, shortness of breath, or cough. Denies other complaints. No vaginal bleeding.   REVIEW OF SYSTEMS:   Review of Systems  Constitutional:  Positive for malaise/fatigue. Negative for fever and weight loss.  Respiratory: Negative.  Negative for cough, hemoptysis and shortness of breath.   Cardiovascular: Negative.  Negative for chest pain and leg swelling.  Gastrointestinal: Negative.  Negative for abdominal pain, blood in stool and melena.  Genitourinary: Negative.  Negative for dysuria.  Musculoskeletal: Negative.  Negative for back pain.  Skin: Negative.  Negative for rash.  Neurological: Negative.  Negative for dizziness, focal weakness, weakness and headaches.  Psychiatric/Behavioral: Negative.  The patient is not nervous/anxious.   As per HPI. Otherwise, a complete review of systems is negative.  PAST MEDICAL HISTORY: Past Medical History:  Diagnosis Date  . Adenomatous colon polyp   . Aortic stenosis   . Arthritis   . Asthma   . Claudication (HCC)   . COPD (chronic obstructive pulmonary disease) (HCC)   . Depression   . Diabetes mellitus without complication (HCC)   . Esophageal dysphagia   . GERD (gastroesophageal reflux disease)   . Hyperlipemia   . Hypertension   . Obesity   .  Severe obesity (BMI 35.0-35.9 with comorbidity) (HCC)     PAST SURGICAL HISTORY: Past Surgical History:  Procedure Laterality Date  . BLADDER SURGERY    . COLONOSCOPY WITH PROPOFOL N/A 08/05/2015   Procedure: COLONOSCOPY WITH PROPOFOL;  Surgeon: Elnita Maxwell, MD;  Location: [facility] ENDOSCOPY;  Service: Endoscopy;  Laterality: N/A;  . ESOPHAGOGASTRODUODENOSCOPY N/A 01/09/2022   Procedure: ESOPHAGOGASTRODUODENOSCOPY (EGD);  Surgeon: Regis Bill, MD;  Location: [facility] ENDOSCOPY;  Service: Endoscopy;  Laterality: N/A;  . FOOT SURGERY Left   . FRACTURE SURGERY    . IR CV LINE INJECTION  06/22/2022  . IR IMAGING GUIDED PORT INSERTION  06/05/2022  . IR PORT REPAIR CENTRAL VENOUS ACCESS DEVICE  07/02/2022  . ORIF ANKLE FRACTURE Left 03/26/2017   Procedure: OPEN REDUCTION INTERNAL FIXATION (ORIF) ANKLE FRACTURE;  Surgeon: Christena Flake, MD;  Location: [facility] ORS;  Service: Orthopedics;  Laterality: Left;    FAMILY HISTORY: Family History  Problem Relation Age of Onset  . CVA Mother   . Diabetes Mother   . CAD Father   . Breast cancer Neg Hx     ADVANCED DIRECTIVES (Y/N):  N  HEALTH MAINTENANCE: Social History   Tobacco Use  . Smoking status: Former    Packs/day: 1.00    Years: 40.00    Additional pack years: 0.00    Total pack years: 40.00    Types: Cigarettes  . Smokeless tobacco: Never  Vaping Use  . Vaping Use: Never used  Substance Use Topics  . Alcohol use: No  . Drug use: No     Colonoscopy:  PAP:  Bone density:  Lipid panel:  No Known Allergies  Current Outpatient Medications  Medication Sig Dispense Refill  . albuterol (PROVENTIL HFA;VENTOLIN HFA) 108 (90 Base) MCG/ACT inhaler Inhale 2 puffs into the lungs every 6 (six) hours as needed for wheezing or shortness of breath.    Marland Kitchen amLODipine (NORVASC) 10 MG tablet Take 10 mg by mouth daily.    Marland Kitchen atorvastatin (LIPITOR) 80 MG tablet Take 80 mg by mouth daily.    Marland Kitchen buPROPion (WELLBUTRIN XL) 300 MG 24 hr tablet  Take 300 mg by mouth daily.    . cholecalciferol (VITAMIN D) 400 units TABS tablet Take 400 Units by mouth daily.    . citalopram (CELEXA) 20 MG tablet Take 20 mg by mouth daily.    . cloNIDine (CATAPRES) 0.2 MG tablet Take 0.2 mg by mouth 2 (two) times daily.    . fluticasone-salmeterol (ADVAIR) 250-50 MCG/ACT AEPB Inhale 1 puff into the lungs in the morning and at bedtime.    Marland Kitchen glipiZIDE (GLUCOTROL XL) 10 MG 24 hr tablet Take 1 tablet by mouth daily.    . insulin aspart (NOVOLOG) 100 UNIT/ML injection Inject 13 Units into the skin 3 (three) times daily before meals.    . Insulin Degludec 100 UNIT/ML SOLN Inject into the skin.    Marland Kitchen insulin detemir (LEVEMIR FLEXPEN) 100 UNIT/ML FlexPen Inject 25 units in the morning. Inject 23 units in the evening.    Marland Kitchen ipratropium-albuterol (DUONEB) 0.5-2.5 (3) MG/3ML SOLN Take 3 mLs by nebulization every 6 (six) hours as needed. For shortness of breath 360 mL 1  . losartan-hydrochlorothiazide (HYZAAR) 100-25 MG tablet Take 1 tablet by mouth daily.    . metoprolol succinate (TOPROL-XL) 50 MG 24 hr tablet Take 1 tablet by mouth daily.    . montelukast (SINGULAIR) 10 MG tablet Take 10 mg by mouth at bedtime.    . ondansetron (ZOFRAN) 8 MG tablet Take by mouth.    Letta Pate VERIO test strip 1 each 3 (three) times daily.    . pantoprazole (PROTONIX) 20 MG tablet Take 20 mg by mouth daily.     No current facility-administered medications for this visit.    OBJECTIVE: Vitals:   10/23/22 0943 10/23/22 0946  BP: (!) 153/51 (!) 140/100  Pulse: 64 68  Resp:    Temp:    SpO2:       Body mass index is 37.15 kg/m.    ECOG FS:0 - Asymptomatic  General: Well-developed, well-nourished, no acute distress. Eyes: Pink conjunctiva, anicteric sclera. Lungs: Clear to auscultation bilaterally.  No audible wheezing or coughing Heart: Regular rate and rhythm.  Abdomen: Soft, nontender, nondistended.  Musculoskeletal: No edema, cyanosis, or clubbing. Neuro: Alert,  answering all questions appropriately. Cranial nerves grossly intact. Skin: No rashes or petechiae noted. Psych: Normal affect.   LAB RESULTS:  Lab Results  Component Value Date   NA 142 10/03/2022   K 3.7 10/03/2022   CL 107 10/03/2022   CO2 29 10/03/2022   GLUCOSE 191 (H) 10/03/2022   BUN 23 10/03/2022   CREATININE 0.98 10/03/2022   CALCIUM 8.4 (L) 10/03/2022   PROT 5.7 (L) 10/03/2022   ALBUMIN 3.3 (L) 10/03/2022   AST 15 10/03/2022   ALT 18 10/03/2022   ALKPHOS 68 10/03/2022   BILITOT 0.4 10/03/2022   GFRNONAA >60 10/03/2022   GFRAA >60 03/27/2017    Lab Results  Component Value Date   WBC 4.7 10/23/2022   NEUTROABS 3.8 10/23/2022   HGB 8.4 (L) 10/23/2022   HCT 27.3 (L) 10/23/2022   MCV 97.8  10/23/2022   PLT 130 (L) 10/23/2022     STUDIES: CT CHEST ABDOMEN PELVIS W CONTRAST  Result Date: 10/12/2022 CLINICAL DATA:  High risk endometrial cancer follow-up * Tracking Code: BO * EXAM: CT CHEST, ABDOMEN, AND PELVIS WITH CONTRAST TECHNIQUE: Multidetector CT imaging of the chest, abdomen and pelvis was performed following the standard protocol during bolus administration of intravenous contrast. RADIATION DOSE REDUCTION: This exam was performed according to the departmental dose-optimization program which includes automated exposure control, adjustment of the mA and/or kV according to patient size and/or use of iterative reconstruction technique. CONTRAST:  OMNIPAQUE IOHEXOL 300 MG/ML SOLN additional oral enteric contrast COMPARISON:  PET-CT, 08/10/2022, CT chest abdomen pelvis, 05/15/2022 FINDINGS: CT CHEST FINDINGS Cardiovascular: Right chest port catheter. Aortic atherosclerosis. Normal heart size. Left coronary artery calcifications. No pericardial effusion. Mediastinum/Nodes: No enlarged mediastinal, hilar, or axillary lymph nodes. Small hiatal hernia. Thyroid gland, trachea, and esophagus demonstrate no significant findings. Lungs/Pleura: Evaluation of the lungs  limited by breath motion artifact throughout. Small bilateral pleural effusions, diffuse bilateral bronchial wall thickening, mild interlobular septal thickening, and diffuse ground-glass attenuation throughout the lungs. Occasional tiny pulmonary nodules are unchanged, many of which appear calcified. Example noncalcified nodules include a 0.2 cm nodule of the peripheral left upper lobe (series 5, image 29) and a 0.3 cm nodule of the superior segment left lower lobe (series 5, image 61). Musculoskeletal: No chest wall abnormality. No acute osseous findings. CT ABDOMEN PELVIS FINDINGS Hepatobiliary: No solid liver abnormality is seen. Hepatic steatosis. No gallstones, gallbladder wall thickening, or biliary dilatation. Pancreas: Unremarkable. No pancreatic ductal dilatation or surrounding inflammatory changes. Spleen: Normal in size. Multiple unchanged low-attenuation lesions throughout the spleen, largest measuring 1.5 x 1.2 cm, not previously FDG avid (series 3, image 43) Adrenals/Urinary Tract: Unchanged, definitively benign fat containing right adrenal adenoma, not previously PET avid, for which no specific further follow-up or characterization is required (series 3, image 51). Simple, benign left renal cortical cyst, for which no further follow-up or characterization is required. Kidneys are otherwise normal, without renal calculi, solid lesion, or hydronephrosis. Bladder is unremarkable. Stomach/Bowel: Stomach is within normal limits. Appendix appears normal. Long segment wall thickening of the distal ileum, which is closely adjacent to the uterus (series 3, image 100). Sigmoid diverticula. Vascular/Lymphatic: Aortic atherosclerosis. Unchanged prominent left and right common iliac lymph nodes, largest on the left measuring 1.4 x 0.9 cm (series 3, image 70). No other enlarged abdominal or pelvic lymph nodes. Reproductive: Unchanged appearance of the uterus (series 3, image 104). Interval development of  perirectal and presacral fat stranding in the low pelvis, likely sequelae of local radiation therapy. Other: No abdominal wall hernia or abnormality. No ascites. Musculoskeletal: No acute osseous findings. IMPRESSION: 1. Unchanged post treatment appearance of the uterus. 2. Unchanged prominent left and right common iliac lymph nodes, not previously FDG avid and most likely reactive. Continued attention on follow-up. No other enlarged abdominal or pelvic lymph nodes. 3. Interval development of perirectal and presacral fat stranding in the low pelvis, consistent with sequelae of local radiation therapy. 4. Long segment wall thickening of the distal ileum, which is closely adjacent to the uterus. As above this most likely reflects sequelae of local radiation therapy. 5. Small bilateral pleural effusions, diffuse bilateral bronchial wall thickening, mild interlobular septal thickening, and diffuse ground-glass attenuation throughout the lungs. Findings are consistent with pulmonary edema. 6. Occasional tiny pulmonary nodules are unchanged, many of which appear calcified, most likely benign incidental sequelae of prior infection  or inflammation. Attention on follow-up. 7. Hepatic steatosis. 8. Coronary artery disease. Aortic Atherosclerosis (ICD10-I70.0). Electronically Signed   By: Jearld Lesch M.D.   On: 10/12/2022 11:24   MM 3D DIAGNOSTIC MAMMOGRAM UNILATERAL RIGHT BREAST  Result Date: 10/11/2022 CLINICAL DATA:  Callback for RIGHT breast asymmetry seen on MLO view only. History of endometrial cancer. EXAM: DIGITAL DIAGNOSTIC UNILATERAL RIGHT MAMMOGRAM WITH TOMOSYNTHESIS TECHNIQUE: Right digital diagnostic mammography and breast tomosynthesis was performed. COMPARISON:  Previous exam(s). ACR Breast Density Category b: There are scattered areas of fibroglandular density. FINDINGS: The previously described finding does not persist with additional views, consistent with superimposed fibroglandular tissue. No  suspicious mass, microcalcification, or other finding is identified. RIGHT chest port. IMPRESSION: No mammographic evidence of malignancy. RECOMMENDATION: Screening mammogram in one year.(Code:[id number]-B-01Y) I have discussed the findings and recommendations with the patient. If applicable, a reminder letter will be sent to the patient regarding the next appointment. BI-RADS CATEGORY  1: Negative. Electronically Signed   By: Meda Klinefelter M.D.   On: 10/11/2022 10:44   MM 3D SCREEN BREAST BILATERAL  Result Date: 09/28/2022 CLINICAL DATA:  Screening. EXAM: DIGITAL SCREENING BILATERAL MAMMOGRAM WITH TOMOSYNTHESIS AND CAD TECHNIQUE: Bilateral screening digital craniocaudal and mediolateral oblique mammograms were obtained. Bilateral screening digital breast tomosynthesis was performed. The images were evaluated with computer-aided detection. COMPARISON:  Previous exam(s). ACR Breast Density Category b: There are scattered areas of fibroglandular density. FINDINGS: In the right breast, a possible asymmetry warrants further evaluation. In the left breast, no findings suspicious for malignancy. IMPRESSION: Further evaluation is suggested for possible asymmetry in the right breast. RECOMMENDATION: Diagnostic mammogram and possibly ultrasound of the right breast. (Code:[id number]) The patient will be contacted regarding the findings, and additional imaging will be scheduled. BI-RADS CATEGORY  0: Incomplete: Need additional imaging evaluation. Electronically Signed   By: Harmon Pier M.D.   On: 09/28/2022 11:03   DG Chest 2 View  Result Date: 09/28/2022 CLINICAL DATA:  shortness of breath; endometerial cancer; patient on keytruda - at risk for pneumonitis. EXAM: CHEST - 2 VIEW COMPARISON:  CT 08/10/2022, radiograph 08/05/2015 FINDINGS: Chest port catheter tip overlies the superior cavoatrial junction. Unchanged mildly enlarged cardiac silhouette. There are mild interstitial opacities and thickening of the minor fissure  with pain lower lung nodular opacities and suspected trace pleural effusions. No evidence of pneumothorax. Unchanged appearance of the right lateral eighth rib in comparison to multiple prior CTs compatible with prior trauma. IMPRESSION: Mild interstitial opacities and thickening of the minor fissure with lower lung nodular opacities and suspected trace pleural effusions. Findings may represent pulmonary edema or atypical infectious/inflammatory process. Electronically Signed   By: Caprice Renshaw M.D.   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 Residual malignancy noted on imaging. Initiated Carbo-taxol-keytruda 06/20/22, s/p 6 cycles. Interval PET on 08/10/22 revealed significant improvement in disease burden. Plan is to transition to maintenance [place] every 3 weeks. Repeat PET after 3-4 cycles or 2-3 months recommended and re-evaluation with gyn onc for possible surgical resection. Labs today reviewed and acceptable for treatment. Proceed with [place] today.  Port: Port revision successful.  Proceed with treatment as above. Anemia- hmg 8.4 today. Reviewed that this is likely related to bone marrow suppression d/t chemotherapy. Would not expect such decrease in counts with keytruda single agent.  Her anemia may also be contributing to her weakness, shortness of breath, and generalized fatigue.  Thrombocytopenia: d/t chemotherapy. Plt 130 today. Monitor.  Hypertension- Blood pressure is moderate very elevated.  Continue monitoring and treatment per primary care. Encouraged home monitoring.  Claudication symptoms: Patient was previously given a referral to vascular surgery.  Disposition:    Patient expressed understanding and was in agreement with this plan. She also understands  that She can call clinic at any time with any questions, concerns, or complaints.    Cancer Staging  Endometrial cancer [facility]) Staging form: Corpus Uteri - Carcinoma and Carcinosarcoma, AJCC 8th Edition - Clinical stage from 05/30/2022: FIGO Stage IIIB (cT3b, cN0, cM0) - Signed by Jeralyn Ruths, MD on 05/30/2022 Stage prefix: Initial diagnosis   Alinda Dooms, NP   10/23/2022 10:00 AM

## 2022-10-23 NOTE — Progress Notes (Signed)
No concerns. 

## 2022-10-24 ENCOUNTER — Other Ambulatory Visit: Payer: PPO

## 2022-10-24 ENCOUNTER — Ambulatory Visit: Payer: PPO

## 2022-10-24 ENCOUNTER — Ambulatory Visit: Payer: PPO | Admitting: Medical Oncology

## 2022-10-24 LAB — T4: T4, Total: 7.8 ug/dL (ref 4.5–12.0)

## 2022-10-24 NOTE — Progress Notes (Signed)
Spoke with Pacey, she still needs to turn in proof of income for the [employer]. She will work on getting this submitted soon.

## 2022-10-25 ENCOUNTER — Encounter: Payer: Self-pay | Admitting: Nurse Practitioner

## 2022-10-26 ENCOUNTER — Encounter: Payer: Self-pay | Admitting: Oncology

## 2022-10-26 ENCOUNTER — Other Ambulatory Visit: Payer: Self-pay

## 2022-10-26 ENCOUNTER — Other Ambulatory Visit: Payer: Self-pay | Admitting: Nurse Practitioner

## 2022-10-26 MED ORDER — GLIPIZIDE ER 10 MG PO TB24
10.0000 mg | ORAL_TABLET | Freq: Every day | ORAL | 0 refills | Status: DC
  Filled 2022-10-26: qty 30, 30d supply, fill #0

## 2022-10-26 MED ORDER — GLIPIZIDE ER 10 MG PO TB24: 10.0000 mg | ORAL_TABLET | Freq: Every day | ORAL | 0 refills | Status: DC

## 2022-11-02 NOTE — Progress Notes (Signed)
Approved [place] for the [employer].

## 2022-11-05 ENCOUNTER — Encounter: Payer: Self-pay | Admitting: *Deleted

## 2022-11-05 ENCOUNTER — Encounter: Payer: Self-pay | Admitting: Oncology

## 2022-11-12 ENCOUNTER — Institutional Professional Consult (permissible substitution): Payer: PPO | Admitting: Student in an Organized Health Care Education/Training Program

## 2022-11-12 ENCOUNTER — Inpatient Hospital Stay: Payer: PPO | Attending: Obstetrics and Gynecology

## 2022-11-12 DIAGNOSIS — C541 Malignant neoplasm of endometrium: Secondary | ICD-10-CM | POA: Insufficient documentation

## 2022-11-12 DIAGNOSIS — D696 Thrombocytopenia, unspecified: Secondary | ICD-10-CM | POA: Insufficient documentation

## 2022-11-12 DIAGNOSIS — I10 Essential (primary) hypertension: Secondary | ICD-10-CM | POA: Insufficient documentation

## 2022-11-12 DIAGNOSIS — Z5112 Encounter for antineoplastic immunotherapy: Secondary | ICD-10-CM | POA: Insufficient documentation

## 2022-11-12 DIAGNOSIS — D649 Anemia, unspecified: Secondary | ICD-10-CM | POA: Insufficient documentation

## 2022-11-14 ENCOUNTER — Encounter: Payer: Self-pay | Admitting: Oncology

## 2022-11-14 ENCOUNTER — Inpatient Hospital Stay: Payer: PPO

## 2022-11-14 ENCOUNTER — Other Ambulatory Visit: Payer: Self-pay

## 2022-11-14 ENCOUNTER — Inpatient Hospital Stay (HOSPITAL_BASED_OUTPATIENT_CLINIC_OR_DEPARTMENT_OTHER): Payer: PPO | Admitting: Oncology

## 2022-11-14 DIAGNOSIS — D649 Anemia, unspecified: Secondary | ICD-10-CM | POA: Diagnosis not present

## 2022-11-14 DIAGNOSIS — Z5112 Encounter for antineoplastic immunotherapy: Secondary | ICD-10-CM | POA: Diagnosis not present

## 2022-11-14 DIAGNOSIS — C541 Malignant neoplasm of endometrium: Secondary | ICD-10-CM

## 2022-11-14 DIAGNOSIS — I10 Essential (primary) hypertension: Secondary | ICD-10-CM | POA: Diagnosis not present

## 2022-11-14 DIAGNOSIS — D696 Thrombocytopenia, unspecified: Secondary | ICD-10-CM | POA: Diagnosis not present

## 2022-11-14 LAB — CBC WITH DIFFERENTIAL (CANCER CENTER ONLY)
Abs Immature Granulocytes: 0.01 10*3/uL (ref 0.00–0.07)
Basophils Absolute: 0 10*3/uL (ref 0.0–0.1)
Basophils Relative: 0 %
Eosinophils Absolute: 0.1 10*3/uL (ref 0.0–0.5)
Eosinophils Relative: 3 %
HCT: 31 % — ABNORMAL LOW (ref 36.0–46.0)
Hemoglobin: 9.9 g/dL — ABNORMAL LOW (ref 12.0–15.0)
Immature Granulocytes: 0 %
Lymphocytes Relative: 7 %
Lymphs Abs: 0.3 10*3/uL — ABNORMAL LOW (ref 0.7–4.0)
MCH: 29.5 pg (ref 26.0–34.0)
MCHC: 31.9 g/dL (ref 30.0–36.0)
MCV: 92.3 fL (ref 80.0–100.0)
Monocytes Absolute: 0.4 10*3/uL (ref 0.1–1.0)
Monocytes Relative: 9 %
Neutro Abs: 3.6 10*3/uL (ref 1.7–7.7)
Neutrophils Relative %: 81 %
Platelet Count: 138 10*3/uL — ABNORMAL LOW (ref 150–400)
RBC: 3.36 MIL/uL — ABNORMAL LOW (ref 3.87–5.11)
RDW: 15.2 % (ref 11.5–15.5)
WBC Count: 4.5 10*3/uL (ref 4.0–10.5)
nRBC: 0 % (ref 0.0–0.2)

## 2022-11-14 LAB — CMP (CANCER CENTER ONLY)
ALT: 11 U/L (ref 0–44)
AST: 16 U/L (ref 15–41)
Albumin: 3.7 g/dL (ref 3.5–5.0)
Alkaline Phosphatase: 64 U/L (ref 38–126)
Anion gap: 6 (ref 5–15)
BUN: 24 mg/dL — ABNORMAL HIGH (ref 8–23)
CO2: 28 mmol/L (ref 22–32)
Calcium: 8.6 mg/dL — ABNORMAL LOW (ref 8.9–10.3)
Chloride: 103 mmol/L (ref 98–111)
Creatinine: 1.05 mg/dL — ABNORMAL HIGH (ref 0.44–1.00)
GFR, Estimated: 56 mL/min — ABNORMAL LOW (ref >60)
Glucose, Bld: 247 mg/dL — ABNORMAL HIGH (ref 70–99)
Potassium: 3.9 mmol/L (ref 3.5–5.1)
Sodium: 137 mmol/L (ref 135–145)
Total Bilirubin: 0.3 mg/dL (ref 0.3–1.2)
Total Protein: 6.2 g/dL — ABNORMAL LOW (ref 6.5–8.1)

## 2022-11-14 MED ORDER — SODIUM CHLORIDE 0.9 % IV SOLN
Freq: Once | INTRAVENOUS | Status: AC
  Filled 2022-11-14: qty 250

## 2022-11-14 MED ORDER — HEPARIN SOD (PORK) LOCK FLUSH 100 UNIT/ML IV SOLN
500.0000 [IU] | Freq: Once | INTRAVENOUS | Status: AC | PRN
  Administered 2022-11-14: 500 [IU]
  Filled 2022-11-14: qty 5

## 2022-11-14 MED ORDER — SODIUM CHLORIDE 0.9 % IV SOLN
200.0000 mg | Freq: Once | INTRAVENOUS | Status: AC
  Administered 2022-11-14: 200 mg via INTRAVENOUS
  Filled 2022-11-14: qty 8

## 2022-11-14 NOTE — Patient Instructions (Signed)
[facility]  Discharge Instructions: Thank you for choosing [facility] to provide your oncology and hematology care.  If you have a lab appointment with the Cancer Center, please go directly to the Cancer Center and check in at the registration area.  Wear comfortable clothing and clothing appropriate for easy access to any Portacath or PICC line.   We strive to give you quality time with your provider. You may need to reschedule your appointment if you arrive late (15 or more minutes).  Arriving late affects you and other patients whose appointments are after yours.  Also, if you miss three or more appointments without notifying the office, you may be dismissed from the clinic at the provider's discretion.      For prescription refill requests, have your pharmacy contact our office and allow 72 hours for refills to be completed.    Today you received the following chemotherapy and/or immunotherapy agents- Keytruda      To help prevent nausea and vomiting after your treatment, we encourage you to take your nausea medication as directed.  BELOW ARE SYMPTOMS THAT SHOULD BE REPORTED IMMEDIATELY: *FEVER GREATER THAN 100.4 F (38 C) OR HIGHER *CHILLS OR SWEATING *NAUSEA AND VOMITING THAT IS NOT CONTROLLED WITH YOUR NAUSEA MEDICATION *UNUSUAL SHORTNESS OF BREATH *UNUSUAL BRUISING OR BLEEDING *URINARY PROBLEMS (pain or burning when urinating, or frequent urination) *BOWEL PROBLEMS (unusual diarrhea, constipation, pain near the anus) TENDERNESS IN MOUTH AND THROAT WITH OR WITHOUT PRESENCE OF ULCERS (sore throat, sores in mouth, or a toothache) UNUSUAL RASH, SWELLING OR PAIN  UNUSUAL VAGINAL DISCHARGE OR ITCHING   Items with * indicate a potential emergency and should be followed up as soon as possible or go to the Emergency Department if any problems should occur.  Please show the CHEMOTHERAPY ALERT CARD or IMMUNOTHERAPY ALERT CARD at check-in to  the Emergency Department and triage nurse.  Should you have questions after your visit or need to cancel or reschedule your appointment, please contact [facility]  [phone] and follow the prompts.  Office hours are 8:00 a.m. to 4:30 p.m. Monday - Friday. Please note that voicemails left after 4:00 p.m. may not be returned until the following business day.  We are closed weekends and major holidays. You have access to a nurse at all times for urgent questions. Please call the main number to the clinic [phone] and follow the prompts.  For any non-urgent questions, you may also contact your provider using MyChart. We now offer e-Visits for anyone 18 and older to request care online for non-urgent symptoms. For details visit [email or web address].   Also download the MyChart app! Go to the app store, search "MyChart", open the app, select [facility], and log in with your MyChart username and password.   

## 2022-11-14 NOTE — Progress Notes (Signed)
Pt in for follow up and treatment.  Pt denies any concerns or difficulties today.

## 2022-11-14 NOTE — Patient Instructions (Signed)

## 2022-11-14 NOTE — Progress Notes (Signed)
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  INTERVAL HISTORY: Patient returns to clinic today for further evaluation and continuation of maintenance Keytruda.  She currently feels well and is asymptomatic.  She is tolerating her treatments without significant side effects.  She has no neurologic complaints.  She denies any recent fevers or illnesses.  She has a good appetite and denies weight loss.  She has no chest pain, shortness of breath, cough, or hemoptysis.  She denies any nausea, vomiting, constipation, or diarrhea.  She has no urinary complaints.  Patient offers no specific complaints today.  REVIEW OF SYSTEMS:   Review of Systems  Constitutional: Negative.  Negative for fever, malaise/fatigue and weight loss.  Respiratory: Negative.  Negative for cough, hemoptysis and shortness of breath.   Cardiovascular: Negative.  Negative for chest pain and leg swelling.  Gastrointestinal: Negative.  Negative for abdominal pain, blood in stool and melena.  Genitourinary: Negative.  Negative for dysuria.  Musculoskeletal: Negative.  Negative for back pain.  Skin: Negative.  Negative for rash.  Neurological: Negative.  Negative for dizziness, focal weakness, weakness and headaches.  Psychiatric/Behavioral: Negative.  The patient is not nervous/anxious.     As per HPI. Otherwise, a complete review of systems is negative.  PAST MEDICAL HISTORY: Past Medical History:  Diagnosis Date   Adenomatous colon polyp    Aortic stenosis    Arthritis    Asthma    Claudication (HCC)    COPD (chronic obstructive pulmonary disease) (HCC)    Depression    Diabetes mellitus without  complication (HCC)    Esophageal dysphagia    GERD (gastroesophageal reflux disease)    Hyperlipemia    Hypertension    Obesity    Severe obesity (BMI 35.0-35.9 with comorbidity) (HCC)     PAST SURGICAL HISTORY: Past Surgical History:  Procedure Laterality Date   BLADDER SURGERY     COLONOSCOPY WITH PROPOFOL N/A 08/05/2015   Procedure: COLONOSCOPY WITH PROPOFOL;  Surgeon: Elnita Maxwell, MD;  Location: [facility] ENDOSCOPY;  Service: Endoscopy;  Laterality: N/A;   ESOPHAGOGASTRODUODENOSCOPY N/A 01/09/2022   Procedure: ESOPHAGOGASTRODUODENOSCOPY (EGD);  Surgeon: Regis Bill, MD;  Location: [facility] ENDOSCOPY;  Service: Endoscopy;  Laterality: N/A;   FOOT SURGERY Left    FRACTURE SURGERY     IR CV LINE INJECTION  06/22/2022   IR IMAGING GUIDED PORT INSERTION  06/05/2022   IR PORT REPAIR CENTRAL VENOUS ACCESS DEVICE  07/02/2022   ORIF ANKLE FRACTURE Left 03/26/2017   Procedure: OPEN REDUCTION INTERNAL FIXATION (ORIF) ANKLE FRACTURE;  Surgeon: Christena Flake, MD;  Location: [facility] ORS;  Service: Orthopedics;  Laterality: Left;    FAMILY HISTORY: Family History  Problem Relation Age of Onset   CVA Mother    Diabetes Mother    CAD Father    Breast cancer Neg Hx     ADVANCED DIRECTIVES (Y/N):  N  HEALTH MAINTENANCE: Social History   Tobacco Use   Smoking status: Former    Current packs/day: 1.00    Average packs/day: 1 pack/day for 40.0 years (40.0 ttl pk-yrs)    Types: Cigarettes   Smokeless tobacco: Never  Vaping Use   Vaping status: Never Used  Substance Use Topics   Alcohol  use: No   Drug use: No     Colonoscopy:  PAP:  Bone density:  Lipid panel:  No Known Allergies  Current Outpatient Medications  Medication Sig Dispense Refill   albuterol (PROVENTIL HFA;VENTOLIN HFA) 108 (90 Base) MCG/ACT inhaler Inhale 2 puffs into the lungs every 6 (six) hours as needed for wheezing or shortness of breath.     amLODipine (NORVASC) 10 MG tablet Take 10 mg by mouth daily.      atorvastatin (LIPITOR) 80 MG tablet Take 80 mg by mouth daily.     buPROPion (WELLBUTRIN XL) 300 MG 24 hr tablet Take 300 mg by mouth daily.     cholecalciferol (VITAMIN D) 400 units TABS tablet Take 400 Units by mouth daily.     citalopram (CELEXA) 20 MG tablet Take 20 mg by mouth daily.     cloNIDine (CATAPRES) 0.2 MG tablet Take 0.2 mg by mouth 2 (two) times daily.     fluticasone-salmeterol (ADVAIR) 250-50 MCG/ACT AEPB Inhale 1 puff into the lungs in the morning and at bedtime.     glipiZIDE (GLUCOTROL XL) 10 MG 24 hr tablet Take 1 tablet (10 mg total) by mouth daily. Pt dropped bottle and is aware that insurance may not pay yet. May need cash pricing or coupon. Thank you. 30 tablet 0   insulin aspart (NOVOLOG) 100 UNIT/ML injection Inject 13 Units into the skin 3 (three) times daily before meals.     Insulin Degludec 100 UNIT/ML SOLN Inject into the skin.     insulin detemir (LEVEMIR FLEXPEN) 100 UNIT/ML FlexPen Inject 25 units in the morning. Inject 23 units in the evening.     ipratropium-albuterol (DUONEB) 0.5-2.5 (3) MG/3ML SOLN Take 3 mLs by nebulization every 6 (six) hours as needed. For shortness of breath 360 mL 1   losartan-hydrochlorothiazide (HYZAAR) 100-25 MG tablet Take 1 tablet by mouth daily.     metoprolol succinate (TOPROL-XL) 50 MG 24 hr tablet Take 1 tablet by mouth daily.     montelukast (SINGULAIR) 10 MG tablet Take 10 mg by mouth at bedtime.     ondansetron (ZOFRAN) 8 MG tablet Take by mouth.     ONETOUCH VERIO test strip 1 each 3 (three) times daily.     pantoprazole (PROTONIX) 20 MG tablet Take 20 mg by mouth daily.     No current facility-administered medications for this visit.   Facility-Administered Medications Ordered in Other Visits  Medication Dose Route Frequency Provider Last Rate Last Admin   heparin lock flush 100 unit/mL  500 Units Intracatheter Once PRN Jeralyn Ruths, MD       pembrolizumab [facility]) 200 mg in sodium chloride 0.9 % 50 mL chemo  infusion  200 mg Intravenous Once Jeralyn Ruths, MD 116 mL/hr at 11/14/22 1021 200 mg at 11/14/22 1021    OBJECTIVE: Vitals:   11/14/22 0905  BP: (!) 147/69  Pulse: 60  Resp: 16  Temp: (!) 97.4 F (36.3 C)  SpO2: 97%     Body mass index is 37.22 kg/m.    ECOG FS:0 - Asymptomatic  General: Well-developed, well-nourished, no acute distress. Eyes: Pink conjunctiva, anicteric sclera. HEENT: Normocephalic, moist mucous membranes. Lungs: No audible wheezing or coughing. Heart: Regular rate and rhythm. Abdomen: Soft, nontender, no obvious distention. Musculoskeletal: No edema, cyanosis, or clubbing. Neuro: Alert, answering all questions appropriately. Cranial nerves grossly intact. Skin: No rashes or petechiae noted. Psych: Normal affect.  LAB RESULTS:  Lab Results  Component Value Date  NA 137 11/14/2022   K 3.9 11/14/2022   CL 103 11/14/2022   CO2 28 11/14/2022   GLUCOSE 247 (H) 11/14/2022   BUN 24 (H) 11/14/2022   CREATININE 1.05 (H) 11/14/2022   CALCIUM 8.6 (L) 11/14/2022   PROT 6.2 (L) 11/14/2022   ALBUMIN 3.7 11/14/2022   AST 16 11/14/2022   ALT 11 11/14/2022   ALKPHOS 64 11/14/2022   BILITOT 0.3 11/14/2022   GFRNONAA 56 (L) 11/14/2022   GFRAA >60 03/27/2017    Lab Results  Component Value Date   WBC 4.5 11/14/2022   NEUTROABS 3.6 11/14/2022   HGB 9.9 (L) 11/14/2022   HCT 31.0 (L) 11/14/2022   MCV 92.3 11/14/2022   PLT 138 (L) 11/14/2022     STUDIES: No results found.  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  CT scan results from October 08, 2022 reviewed independently with no obvious evidence of progressive disease.  Patient was evaluated by gynecology oncology who recommends a repeat PET scan in  approximately September 2024.  She completed 6 cycles of carboplatinum, Taxol, and Keytruda on October 03, 2022.  Patient then initiated maintenance Keytruda on October 23, 2022.  Recommendation is to complete at least 2 years of maintenance treatment.  Proceed with cycle 2 of treatment today.  Return to clinic in 3 weeks for treatment only and then in 6 weeks for further evaluation and continuation of maintenance Keytruda.   Port: Port revision successful.  Proceed with treatment as above. Anemia: Improving.  Patient's hemoglobin is 9.9 today. Thrombocytopenia: Improving.  Patient platelet count is 138. Hypertension: Chronic and unchanged.  Blood pressure is moderate very elevated.  Continue monitoring and treatment per primary care. Claudication symptoms: Patient was previously given a referral to vascular surgery.   Patient expressed understanding and was in agreement with this plan. She also understands that She can call clinic at any time with any questions, concerns, or complaints.    Cancer Staging  Endometrial cancer [facility]) Staging form: Corpus Uteri - Carcinoma and Carcinosarcoma, AJCC 8th Edition - Clinical stage from 05/30/2022: FIGO Stage IIIB (cT3b, cN0, cM0) - Signed by Jeralyn Ruths, MD on 05/30/2022 Stage prefix: Initial diagnosis   Jeralyn Ruths, MD   11/14/2022 10:39 AM

## 2022-11-17 ENCOUNTER — Encounter: Payer: Self-pay | Admitting: Oncology

## 2022-11-19 ENCOUNTER — Encounter: Payer: Self-pay | Admitting: Oncology

## 2022-11-22 ENCOUNTER — Inpatient Hospital Stay: Payer: PPO

## 2022-11-22 ENCOUNTER — Institutional Professional Consult (permissible substitution): Payer: PPO | Admitting: Student in an Organized Health Care Education/Training Program

## 2022-12-03 ENCOUNTER — Encounter: Payer: Self-pay | Admitting: Oncology

## 2022-12-05 ENCOUNTER — Other Ambulatory Visit: Payer: PPO

## 2022-12-05 ENCOUNTER — Other Ambulatory Visit: Payer: Self-pay

## 2022-12-05 ENCOUNTER — Inpatient Hospital Stay: Payer: PPO

## 2022-12-05 ENCOUNTER — Inpatient Hospital Stay: Payer: PPO | Attending: Obstetrics and Gynecology

## 2022-12-05 ENCOUNTER — Ambulatory Visit: Payer: PPO | Admitting: Medical Oncology

## 2022-12-05 ENCOUNTER — Ambulatory Visit: Payer: PPO

## 2022-12-05 VITALS — BP 131/48 | HR 50 | Temp 97.1°F | Resp 17 | Wt 193.0 lb

## 2022-12-05 DIAGNOSIS — Z5112 Encounter for antineoplastic immunotherapy: Secondary | ICD-10-CM | POA: Insufficient documentation

## 2022-12-05 DIAGNOSIS — C541 Malignant neoplasm of endometrium: Secondary | ICD-10-CM | POA: Insufficient documentation

## 2022-12-05 MED ORDER — SODIUM CHLORIDE 0.9 % IV SOLN
200.0000 mg | Freq: Once | INTRAVENOUS | Status: AC
  Administered 2022-12-05: 200 mg via INTRAVENOUS
  Filled 2022-12-05: qty 200

## 2022-12-05 MED ORDER — HEPARIN SOD (PORK) LOCK FLUSH 100 UNIT/ML IV SOLN
500.0000 [IU] | Freq: Once | INTRAVENOUS | Status: AC | PRN
  Administered 2022-12-05: 500 [IU]
  Filled 2022-12-05: qty 5

## 2022-12-05 MED ORDER — SODIUM CHLORIDE 0.9 % IV SOLN
Freq: Once | INTRAVENOUS | Status: AC
  Filled 2022-12-05: qty 250

## 2022-12-05 NOTE — Patient Instructions (Signed)
[facility]  Discharge Instructions: Thank you for choosing [facility] to provide your oncology and hematology care.  If you have a lab appointment with the Cancer Center, please go directly to the Cancer Center and check in at the registration area.  Wear comfortable clothing and clothing appropriate for easy access to any Portacath or PICC line.   We strive to give you quality time with your provider. You may need to reschedule your appointment if you arrive late (15 or more minutes).  Arriving late affects you and other patients whose appointments are after yours.  Also, if you miss three or more appointments without notifying the office, you may be dismissed from the clinic at the provider's discretion.      For prescription refill requests, have your pharmacy contact our office and allow 72 hours for refills to be completed.    Today you received the following chemotherapy and/or immunotherapy agents- Keytruda      To help prevent nausea and vomiting after your treatment, we encourage you to take your nausea medication as directed.  BELOW ARE SYMPTOMS THAT SHOULD BE REPORTED IMMEDIATELY: *FEVER GREATER THAN 100.4 F (38 C) OR HIGHER *CHILLS OR SWEATING *NAUSEA AND VOMITING THAT IS NOT CONTROLLED WITH YOUR NAUSEA MEDICATION *UNUSUAL SHORTNESS OF BREATH *UNUSUAL BRUISING OR BLEEDING *URINARY PROBLEMS (pain or burning when urinating, or frequent urination) *BOWEL PROBLEMS (unusual diarrhea, constipation, pain near the anus) TENDERNESS IN MOUTH AND THROAT WITH OR WITHOUT PRESENCE OF ULCERS (sore throat, sores in mouth, or a toothache) UNUSUAL RASH, SWELLING OR PAIN  UNUSUAL VAGINAL DISCHARGE OR ITCHING   Items with * indicate a potential emergency and should be followed up as soon as possible or go to the Emergency Department if any problems should occur.  Please show the CHEMOTHERAPY ALERT CARD or IMMUNOTHERAPY ALERT CARD at check-in to  the Emergency Department and triage nurse.  Should you have questions after your visit or need to cancel or reschedule your appointment, please contact [facility]  [phone] and follow the prompts.  Office hours are 8:00 a.m. to 4:30 p.m. Monday - Friday. Please note that voicemails left after 4:00 p.m. may not be returned until the following business day.  We are closed weekends and major holidays. You have access to a nurse at all times for urgent questions. Please call the main number to the clinic [phone] and follow the prompts.  For any non-urgent questions, you may also contact your provider using MyChart. We now offer e-Visits for anyone 18 and older to request care online for non-urgent symptoms. For details visit [email or web address].   Also download the MyChart app! Go to the app store, search "MyChart", open the app, select [facility], and log in with your MyChart username and password.   

## 2022-12-12 ENCOUNTER — Other Ambulatory Visit: Payer: Self-pay | Admitting: Oncology

## 2022-12-15 ENCOUNTER — Encounter: Payer: Self-pay | Admitting: Oncology

## 2022-12-19 ENCOUNTER — Encounter: Payer: Self-pay | Admitting: Family Medicine

## 2022-12-19 ENCOUNTER — Ambulatory Visit
Admission: EM | Admit: 2022-12-19 | Discharge: 2022-12-19 | Payer: PPO | Attending: Family Medicine | Admitting: Family Medicine

## 2022-12-19 ENCOUNTER — Emergency Department: Payer: PPO

## 2022-12-19 ENCOUNTER — Other Ambulatory Visit: Payer: Self-pay

## 2022-12-19 ENCOUNTER — Emergency Department
Admission: EM | Admit: 2022-12-19 | Discharge: 2022-12-19 | Disposition: A | Discharge status: Home / Self Care | Payer: PPO | Attending: Emergency Medicine | Admitting: Emergency Medicine

## 2022-12-19 DIAGNOSIS — J4489 Other specified chronic obstructive pulmonary disease: Secondary | ICD-10-CM | POA: Diagnosis not present

## 2022-12-19 DIAGNOSIS — W19XXXA Unspecified fall, initial encounter: Secondary | ICD-10-CM

## 2022-12-19 DIAGNOSIS — Z6836 Body mass index (BMI) 36.0-36.9, adult: Secondary | ICD-10-CM | POA: Diagnosis not present

## 2022-12-19 DIAGNOSIS — I160 Hypertensive urgency: Secondary | ICD-10-CM | POA: Diagnosis not present

## 2022-12-19 DIAGNOSIS — Y92009 Unspecified place in unspecified non-institutional (private) residence as the place of occurrence of the external cause: Secondary | ICD-10-CM | POA: Diagnosis not present

## 2022-12-19 DIAGNOSIS — S0231XA Fracture of orbital floor, right side, initial encounter for closed fracture: Secondary | ICD-10-CM | POA: Insufficient documentation

## 2022-12-19 DIAGNOSIS — E119 Type 2 diabetes mellitus without complications: Secondary | ICD-10-CM | POA: Diagnosis not present

## 2022-12-19 DIAGNOSIS — Z794 Long term (current) use of insulin: Secondary | ICD-10-CM | POA: Diagnosis not present

## 2022-12-19 DIAGNOSIS — J449 Chronic obstructive pulmonary disease, unspecified: Secondary | ICD-10-CM | POA: Diagnosis not present

## 2022-12-19 DIAGNOSIS — R0689 Other abnormalities of breathing: Secondary | ICD-10-CM | POA: Diagnosis not present

## 2022-12-19 DIAGNOSIS — S0511XA Contusion of eyeball and orbital tissues, right eye, initial encounter: Secondary | ICD-10-CM | POA: Diagnosis not present

## 2022-12-19 DIAGNOSIS — R609 Edema, unspecified: Secondary | ICD-10-CM | POA: Diagnosis not present

## 2022-12-19 DIAGNOSIS — K219 Gastro-esophageal reflux disease without esophagitis: Secondary | ICD-10-CM | POA: Diagnosis not present

## 2022-12-19 DIAGNOSIS — T797XXA Traumatic subcutaneous emphysema, initial encounter: Secondary | ICD-10-CM | POA: Diagnosis not present

## 2022-12-19 DIAGNOSIS — S02831A Fracture of medial orbital wall, right side, initial encounter for closed fracture: Secondary | ICD-10-CM | POA: Diagnosis not present

## 2022-12-19 DIAGNOSIS — E782 Mixed hyperlipidemia: Secondary | ICD-10-CM | POA: Diagnosis not present

## 2022-12-19 DIAGNOSIS — W01190A Fall on same level from slipping, tripping and stumbling with subsequent striking against furniture, initial encounter: Secondary | ICD-10-CM | POA: Diagnosis not present

## 2022-12-19 DIAGNOSIS — S0990XA Unspecified injury of head, initial encounter: Secondary | ICD-10-CM | POA: Diagnosis not present

## 2022-12-19 DIAGNOSIS — S0993XA Unspecified injury of face, initial encounter: Secondary | ICD-10-CM | POA: Diagnosis not present

## 2022-12-19 DIAGNOSIS — W1841XA Slipping, tripping and stumbling without falling due to stepping on object, initial encounter: Secondary | ICD-10-CM | POA: Insufficient documentation

## 2022-12-19 DIAGNOSIS — I10 Essential (primary) hypertension: Secondary | ICD-10-CM | POA: Diagnosis not present

## 2022-12-19 DIAGNOSIS — Z79899 Other long term (current) drug therapy: Secondary | ICD-10-CM | POA: Diagnosis not present

## 2022-12-19 DIAGNOSIS — Z87891 Personal history of nicotine dependence: Secondary | ICD-10-CM | POA: Diagnosis not present

## 2022-12-19 DIAGNOSIS — S0083XA Contusion of other part of head, initial encounter: Secondary | ICD-10-CM | POA: Insufficient documentation

## 2022-12-19 DIAGNOSIS — E785 Hyperlipidemia, unspecified: Secondary | ICD-10-CM | POA: Diagnosis not present

## 2022-12-19 DIAGNOSIS — Z7984 Long term (current) use of oral hypoglycemic drugs: Secondary | ICD-10-CM | POA: Diagnosis not present

## 2022-12-19 DIAGNOSIS — H50631 Inferior rectus muscle entrapment, right eye: Secondary | ICD-10-CM | POA: Diagnosis not present

## 2022-12-19 DIAGNOSIS — R001 Bradycardia, unspecified: Secondary | ICD-10-CM | POA: Diagnosis not present

## 2022-12-19 LAB — COMPREHENSIVE METABOLIC PANEL
ALT: 17 U/L (ref 0–44)
AST: 17 U/L (ref 15–41)
Albumin: 3.9 g/dL (ref 3.5–5.0)
Alkaline Phosphatase: 69 U/L (ref 38–126)
Anion gap: 9 (ref 5–15)
BUN: 24 mg/dL — ABNORMAL HIGH (ref 8–23)
CO2: 26 mmol/L (ref 22–32)
Calcium: 8.8 mg/dL — ABNORMAL LOW (ref 8.9–10.3)
Chloride: 102 mmol/L (ref 98–111)
Creatinine, Ser: 0.95 mg/dL (ref 0.44–1.00)
GFR, Estimated: >60 mL/min (ref >60)
Glucose, Bld: 176 mg/dL — ABNORMAL HIGH (ref 70–99)
Potassium: 3.8 mmol/L (ref 3.5–5.1)
Sodium: 137 mmol/L (ref 135–145)
Total Bilirubin: 0.8 mg/dL (ref 0.3–1.2)
Total Protein: 6.9 g/dL (ref 6.5–8.1)

## 2022-12-19 LAB — CBC
HCT: 38.8 % (ref 36.0–46.0)
Hemoglobin: 12.5 g/dL (ref 12.0–15.0)
MCH: 28 pg (ref 26.0–34.0)
MCHC: 32.2 g/dL (ref 30.0–36.0)
MCV: 86.8 fL (ref 80.0–100.0)
Platelets: 147 10*3/uL — ABNORMAL LOW (ref 150–400)
RBC: 4.47 MIL/uL (ref 3.87–5.11)
RDW: 13.6 % (ref 11.5–15.5)
WBC: 6.6 10*3/uL (ref 4.0–10.5)
nRBC: 0 % (ref 0.0–0.2)

## 2022-12-19 MED ORDER — DEXAMETHASONE SODIUM PHOSPHATE 10 MG/ML IJ SOLN
10.0000 mg | Freq: Once | INTRAMUSCULAR | Status: AC
  Administered 2022-12-19: 10 mg via INTRAVENOUS
  Filled 2022-12-19: qty 1

## 2022-12-19 NOTE — ED Notes (Signed)
[facility]  TRANSFER  CALLED PER  J BACON PA

## 2022-12-19 NOTE — ED Notes (Signed)
Patient is discharged to ED per Dr Rachael Darby for head injury.

## 2022-12-19 NOTE — ED Notes (Signed)
EMTALA reviewed by this charge RN at this time.  All sections completed.  Pt ready for transport to [facility] ED via [facility].

## 2022-12-19 NOTE — ED Triage Notes (Signed)
Pt tripped over a can at home and fell into a table. Pt hit her right eye, it is currently closed shut. Pt denies loss of consciousness.

## 2022-12-19 NOTE — ED Provider Notes (Signed)
[facility]    CSN: [id number] Arrival date & time: 12/19/22  0946      History   Chief Complaint Chief Complaint  Patient presents with   Fall    HPI Terri [name] is a 72 y.o. female.   HPI  Grabbed by front office staff to see patient.   Terri [name] presents for presents after a fall this morning around 630 AM. She walking and she believes a medicine bottle or can was moved in her pathway by her cat.  She tripped and fell injuring her face.  She said she had to sit there for a minute and had some blurry vision.  Denies any loss of consciousness.  No one else was around to see the fall.  Since the fall she is having increasing swelling to her right eye.  Her friend notes that after she blew her nose her eye swelling got significantly worse. Has difficulty walking at baseline but doesn't use a cane or walker.    She took her blood pressure medications around 730 AM.        Past Medical History:  Diagnosis Date   Adenomatous colon polyp    Aortic stenosis    Arthritis    Asthma    Claudication (HCC)    COPD (chronic obstructive pulmonary disease) (HCC)    Depression    Diabetes mellitus without complication (HCC)    Esophageal dysphagia    GERD (gastroesophageal reflux disease)    Hyperlipemia    Hypertension    Obesity    Severe obesity (BMI 35.0-35.9 with comorbidity) (HCC)     Patient Active Problem List   Diagnosis Date Noted   Anemia 09/28/2022   Endometrial cancer (HCC) 01/25/2022   Mild aortic stenosis 10/12/2020   Essential hypertension 11/05/2017   Closed fracture of left ankle 03/29/2017   Ankle fracture, left 03/26/2017   Acute respiratory failure with hypoxia (HCC) 08/05/2015   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   Hyperlipemia, mixed 04/05/2015   HTN, goal below 140/80 04/05/2015   Obesity (BMI 30-39.9) 04/05/2015   Type 2 diabetes mellitus with complication, with long-term current use of insulin (HCC) 04/05/2015   Class 2 severe obesity due to  excess calories with serious comorbidity in adult (HCC) 08/19/2013   Obesity, Class II, BMI 35-39.9, with comorbidity 08/19/2013   Chronic obstructive pulmonary disease, unspecified (HCC) 07/31/2012   Claudication (HCC) 03/12/2012   Tobacco use 03/12/2012   Hyperlipidemia associated with type 2 diabetes mellitus (HCC) 06/06/2011   Hypertension associated with diabetes (HCC) 06/06/2011   Major depression 06/06/2011   Type 2 diabetes mellitus without complication (HCC) 06/06/2011    Past Surgical History:  Procedure Laterality Date   BLADDER SURGERY     COLONOSCOPY WITH PROPOFOL N/A 08/05/2015   Procedure: COLONOSCOPY WITH PROPOFOL;  Surgeon: Elnita Maxwell, MD;  Location: [facility] ENDOSCOPY;  Service: Endoscopy;  Laterality: N/A;   ESOPHAGOGASTRODUODENOSCOPY N/A 01/09/2022   Procedure: ESOPHAGOGASTRODUODENOSCOPY (EGD);  Surgeon: Regis Bill, MD;  Location: [facility] ENDOSCOPY;  Service: Endoscopy;  Laterality: N/A;   FOOT SURGERY Left    FRACTURE SURGERY     IR CV LINE INJECTION  06/22/2022   IR IMAGING GUIDED PORT INSERTION  06/05/2022   IR PORT REPAIR CENTRAL VENOUS ACCESS DEVICE  07/02/2022   ORIF ANKLE FRACTURE Left 03/26/2017   Procedure: OPEN REDUCTION INTERNAL FIXATION (ORIF) ANKLE FRACTURE;  Surgeon: Christena Flake, MD;  Location: [facility] ORS;  Service: Orthopedics;  Laterality: Left;    OB History  No obstetric history on file.      Home Medications    Prior to Admission medications   Medication Sig Start Date End Date Taking? Authorizing Provider  albuterol (PROVENTIL HFA;VENTOLIN HFA) 108 (90 Base) MCG/ACT inhaler Inhale 2 puffs into the lungs every 6 (six) hours as needed for wheezing or shortness of breath.    [provider]  amLODipine (NORVASC) 10 MG tablet Take 10 mg by mouth daily.    [provider]  atorvastatin (LIPITOR) 80 MG tablet Take 80 mg by mouth daily.    [provider]  buPROPion (WELLBUTRIN XL) 300 MG 24 hr tablet Take 300  mg by mouth daily.    [provider]  cholecalciferol (VITAMIN D) 400 units TABS tablet Take 400 Units by mouth daily.    [provider]  citalopram (CELEXA) 20 MG tablet Take 20 mg by mouth daily.    [provider]  cloNIDine (CATAPRES) 0.2 MG tablet Take 0.2 mg by mouth 2 (two) times daily.    [provider]  fluticasone-salmeterol (ADVAIR) 250-50 MCG/ACT AEPB Inhale 1 puff into the lungs in the morning and at bedtime. 09/26/22 09/26/23  [provider]  glipiZIDE (GLUCOTROL XL) 10 MG 24 hr tablet Take 1 tablet (10 mg total) by mouth daily. Pt dropped bottle and is aware that insurance may not pay yet. May need cash pricing or coupon. Thank you. 10/26/22 10/26/23  Alinda Dooms, NP  insulin aspart (NOVOLOG) 100 UNIT/ML injection Inject 13 Units into the skin 3 (three) times daily before meals.    [provider]  Insulin Degludec 100 UNIT/ML SOLN Inject into the skin. 09/26/22   [provider]  insulin detemir (LEVEMIR FLEXPEN) 100 UNIT/ML FlexPen Inject 25 units in the morning. Inject 23 units in the evening. 12/14/20   [provider]  ipratropium-albuterol (DUONEB) 0.5-2.5 (3) MG/3ML SOLN Take 3 mLs by nebulization every 6 (six) hours as needed. For shortness of breath 09/28/22   Alinda Dooms, NP  losartan-hydrochlorothiazide (HYZAAR) 100-25 MG tablet Take 1 tablet by mouth daily.    [provider]  metoprolol succinate (TOPROL-XL) 50 MG 24 hr tablet Take 1 tablet by mouth daily. 07/10/22   [provider]  montelukast (SINGULAIR) 10 MG tablet Take 10 mg by mouth at bedtime.    [provider]  ondansetron (ZOFRAN) 8 MG tablet Take by mouth. 05/30/22   [provider]  [facility] VERIO test strip 1 each 3 (three) times daily. 08/17/22   [provider]  pantoprazole (PROTONIX) 20 MG tablet Take 20 mg by mouth daily.    [provider]    Family History Family History   Problem Relation Age of Onset   CVA Mother    Diabetes Mother    CAD Father    Breast cancer Neg Hx     Social History Social History   Tobacco Use   Smoking status: Former    Current packs/day: 1.00    Average packs/day: 1 pack/day for 40.0 years (40.0 ttl pk-yrs)    Types: Cigarettes   Smokeless tobacco: Never  Vaping Use   Vaping status: Never Used  Substance Use Topics   Alcohol use: No   Drug use: No     Allergies   Patient has no known allergies.   Review of Systems Review of Systems: negative unless otherwise stated in HPI.      Physical Exam Triage Vital Signs ED Triage Vitals  Encounter Vitals  Group     BP 12/19/22 0958 (!) 191/60     Systolic BP Percentile --      Diastolic BP Percentile --      Pulse Rate 12/19/22 0954 (!) 53     Resp 12/19/22 0954 18     Temp 12/19/22 0954 97.7 F (36.5 C)     Temp Source 12/19/22 0954 Oral     SpO2 12/19/22 0954 99 %     Weight --      Height --      Head Circumference --      Peak Flow --      Pain Score 12/19/22 0952 5     Pain Loc --      Pain Education --      Exclude from Growth Chart --    No data found.  Updated Vital Signs BP (!) 191/60 (BP Location: Right Arm)   Pulse (!) 53   Temp 97.7 F (36.5 C) (Oral)   Resp 18   SpO2 99%   Visual Acuity Right Eye Distance:   Left Eye Distance:   Bilateral Distance:    Right Eye Near:   Left Eye Near:    Bilateral Near:     Physical Exam GEN: Alert, female in no acute distress  EYES: moderate edema of right eye lids, TTP surrounding right eye,  bruising HENT: Moist mucous membranes, no oropharyngeal lesions, no blood visble,  CV: regula rhythm, bradycardic RESP: no increased work of breathing, distant air sounds MSK: No extremity edema or deformities SKIN: warm, dry, no abrasions NEURO: alert, moves all extremities appropriately, strength 5/5 bilateral upper and 4/5 RLE and 5/5 LLE, alert and oriented, normal speech      UC Treatments  / Results  Labs (all labs ordered are listed, but only abnormal results are displayed) Labs Reviewed - No data to display  EKG   Radiology No results found.  Procedures Procedures (including critical care time)  Medications Ordered in UC Medications - No data to display  Initial Impression / Assessment and Plan / UC Course  I have reviewed the triage vital signs and the nursing notes.  Pertinent labs & imaging results that were available during my care of the patient were reviewed by me and considered in my medical decision making (see chart for details).       Patient is a 72 y.o. female who presents for head injury that occurred about 3 hours ago.  She doesn't take any blood thinners.  She is significantly hypertensive, BP 191/60 and is bradycardic, HR 53.  Took her medications this morning.  She has history of aortic stenosis.    Says she tripped over something the cat may have kicked in her way. She had moderate edema surrounding her right eye and she will need advanced imaging.  Had some double vision at one point but denies this now.  Edema got worse after blowing her nose concerning for facial fracture. Strength appears to be at her baseline.  Given her hypertension and head injury recommended ED evaluation. Urgent care work up truncated.   Pt states she can't drive. EMS called and updated upon arrival. [facility] ED and spoke with triage RN who will await pt's arrival.        Final Clinical Impressions(s) / UC Diagnoses   Final diagnoses:  Fall, initial encounter  Injury of head, initial encounter  Hypertensive urgency   Discharge Instructions   None    ED Prescriptions  None    PDMP not reviewed this encounter.   Katha Cabal, DO 12/19/22 1030

## 2022-12-19 NOTE — Progress Notes (Signed)
Contacted by Clinical Team at [facility] ED (spoke with Ronnie Doss, PA-C) patient arrived in ED after a fall and CT max/face revealed right orbital floor fracture with evidence of possible herniation of soft tissues and inferior orbital rectus muscle through the fracture site.  Reportedly ENT on-call was consulted and does not perform orbital floor fracture surgery cases for facial trauma.  Ophthalmology on-call was also consulted and deferred to ENT.   I personally reviewed the images and interpretation by radiology outlined below.  There is evidence of subcutaneous emphysema that extends along the right face and along the deep soft tissue planes in the neck on the right side to the level of thyroid gland, unclear source.  No other facial fractures clearly detected by radiology or on my review.  Based on the scan I recommend ophthalmology evaluation in the ED to rule out entrapment or oculo-cardiac reflex that would warrant acute interventions.  I also recommend general ENT evaluation to determine if there is any source for significant subcutaneous emphysema seen on CT scan.  If unable to have outpatient evaluation by ophthalmology or general ENT consider transferring to [facility] or [facility] for further evaluation.   Ashok Croon, MD   FINDINGS: CT HEAD FINDINGS   Brain: No evidence of acute infarction, hemorrhage, hydrocephalus, extra-axial collection or mass lesion/mass effect.   Vascular: No hyperdense vessel or unexpected calcification.   Skull: Normal. Negative for fracture or focal lesion.   Other: None.   CT MAXILLOFACIAL FINDINGS   Osseous: There is a displaced fracture of the orbital floor with herniation of the inferior rectus muscle body through the fracture site.   Orbits: There is air in the intraconal compartment of the orbit abutting the right optic nerve. No evidence of an intraorbital hematoma. Right globe is normal in appearance. Bilateral lens replacement.    Sinuses: No middle ear or mastoid effusion. Small amount of layering blood products in the right maxillary sinus. Paranasal sinuses clear.   Soft tissues: There is subcutaneous gas extending superiorly to the periorbital soft tissues.   CT CERVICAL SPINE FINDINGS   Alignment: Normal.   Skull base and vertebrae: No acute fracture. No primary bone lesion or focal pathologic process.   Soft tissues and spinal canal: No prevertebral fluid or swelling. No visible canal hematoma. There is subcutaneous emphysema that extends inferiorly to the level of the thyroid and superiorly to the level of the periorbital soft tissues.   Disc levels:  No evidence of high-grade spinal canal stenosis.   Upper chest: Negative.   Other: None   IMPRESSION: 1. No acute intracranial abnormality. 2. Right orbital floor fracture with herniation of the inferior rectus muscle body through the fracture site. Findings can be seen in the setting of entrapment. Recommend ophthalmologic consultation. 3. Air in the intraconal compartment of the orbit abutting the right optic nerve. No evidence of an intraorbital hematoma. 4. No acute fracture or traumatic subluxation of the cervical spine. 5. Subcutaneous emphysema that extends inferiorly to the level of the thyroid and superiorly to the level of the periorbital soft tissues. This may be secondary to an additional occult maxillary sinus injury, possibly along the anterior wall.

## 2022-12-19 NOTE — ED Notes (Signed)
See triage notes. Patient came from [facility] UC after tripping and falling over her cat.

## 2022-12-19 NOTE — ED Notes (Signed)
XRAY  POWERSHARE  WITH  [facility] 

## 2022-12-19 NOTE — ED Notes (Signed)
[facility]  transfer  center  called  per  Ronald Lobo  NP

## 2022-12-19 NOTE — ED Notes (Addendum)
Patient is being discharged from the Urgent Care and sent to the Emergency Department via EMS. Per Dr.Brimage, patient is in need of higher level of care due to head injury. Patient is aware and verbalizes understanding of plan of care.  Vitals:   12/19/22 0954 12/19/22 0958  BP:  (!) 191/60  Pulse: (!) 53   Resp: 18   Temp: 97.7 F (36.5 C)   SpO2: 99%

## 2022-12-19 NOTE — ED Notes (Signed)
Xray  [facility] 

## 2022-12-19 NOTE — ED Provider Notes (Signed)
[facility] Emergency Department Provider Note     Event Date/Time   First MD Initiated Contact with Patient 12/19/22 1141     (approximate)   History   Fall   HPI  Terri [name] is a 72 y.o. female with a history of COPD, HTN, HLD, DM, severe obesity, presents to the ED via EMS from [facility].  Patient presented herself there for evaluation of injury sustained following mechanical fall this morning.  Patient apparently tripped over a medicine bottle that apparently had been kicked into her walking pathway by her cat.  She fell landing face first.  Patient denies any LOC although she sat there for a few minutes due to some blurry vision.  Apparently the patient blew her nose sometime after the incident, and developed some sudden and significant welling to the right eye.  Patient ambulates independently without the use of an assistive device.     Physical Exam   Triage Vital Signs: ED Triage Vitals  Encounter Vitals Group     BP 12/19/22 1058 (!) 195/61     Systolic BP Percentile --      Diastolic BP Percentile --      Pulse Rate 12/19/22 1055 (!) 50     Resp 12/19/22 1055 18     Temp 12/19/22 1055 97.9 F (36.6 C)     Temp src --      SpO2 12/19/22 1055 99 %     Weight --      Height --      Head Circumference --      Peak Flow --      Pain Score 12/19/22 1056 2     Pain Loc --      Pain Education --      Exclude from Growth Chart --     Most recent vital signs: Vitals:   12/19/22 1335 12/19/22 1614  BP: (!) 185/54 (!) 187/55  Pulse: (!) 54 (!) 54  Resp: 18 16  Temp: 97.7 F (36.5 C) 97.9 F (36.6 C)  SpO2: 99% 95%    General Awake, no distress. NAD HEENT NCAT. PERRL. EOMI. periorbital edema on the right eye.  Patient is unable to actively open the lid on the right.  Gross exam of the globe reveals no subconjunctival hemorrhage and no hyphema.  Patient with normal medial and lateral gaze on the right.  She is unable to  express upward gaze on the right.  No rhinorrhea/epistaxis.  No septal hematoma noted.  Mucous membranes are moist.  TMs intact bilaterally. CV:  Good peripheral perfusion.  RESP:  Normal effort. CTA ABD:  No distention.  NEURO: Cranial nerves II to XII grossly intact.   ED Results / Procedures / Treatments   Labs (all labs ordered are listed, but only abnormal results are displayed) Labs Reviewed  CBC - Abnormal; Notable for the following components:      Result Value   Platelets 147 (*)    All other components within normal limits  COMPREHENSIVE METABOLIC PANEL - Abnormal; Notable for the following components:   Glucose, Bld 176 (*)    BUN 24 (*)    Calcium 8.8 (*)    All other components within normal limits     EKG  Vent. rate 50 BPM PR interval 164 ms QRS duration 158 ms QT/QTcB 532/485 ms P-R-T axes 66 -60 33 Sinus bradycardia Right bundle branch block Left anterior fascicular block Bifascicular block  Minimal voltage  criteria for LVH, may be normal variant ( R in aVL ) Abnormal ECG No previous ECGs available  RADIOLOGY  I personally viewed and evaluated these images as part of my medical decision making, as well as reviewing the written report by the radiologist.  ED Provider Interpretation: Evidence of inferior orbital floor fracture with entrapment of the inferior rectus muscle  CT Head Wo Contrast  Result Date: 12/19/2022 CLINICAL DATA:  fall; Facial trauma, blunt; Neck trauma (Age >= 65y) EXAM: CT HEAD WITHOUT CONTRAST CT MAXILLOFACIAL WITHOUT CONTRAST CT CERVICAL SPINE WITHOUT CONTRAST TECHNIQUE: Multidetector CT imaging of the head, cervical spine, and maxillofacial structures were performed using the standard protocol without intravenous contrast. Multiplanar CT image reconstructions of the cervical spine and maxillofacial structures were also generated. RADIATION DOSE REDUCTION: This exam was performed according to the departmental dose-optimization  program which includes automated exposure control, adjustment of the mA and/or kV according to patient size and/or use of iterative reconstruction technique. COMPARISON:  None Available. FINDINGS: CT HEAD FINDINGS Brain: No evidence of acute infarction, hemorrhage, hydrocephalus, extra-axial collection or mass lesion/mass effect. Vascular: No hyperdense vessel or unexpected calcification. Skull: Normal. Negative for fracture or focal lesion. Other: None. CT MAXILLOFACIAL FINDINGS Osseous: There is a displaced fracture of the orbital floor with herniation of the inferior rectus muscle body through the fracture site. Orbits: There is air in the intraconal compartment of the orbit abutting the right optic nerve. No evidence of an intraorbital hematoma. Right globe is normal in appearance. Bilateral lens replacement. Sinuses: No middle ear or mastoid effusion. Small amount of layering blood products in the right maxillary sinus. Paranasal sinuses clear. Soft tissues: There is subcutaneous gas extending superiorly to the periorbital soft tissues. CT CERVICAL SPINE FINDINGS Alignment: Normal. Skull base and vertebrae: No acute fracture. No primary bone lesion or focal pathologic process. Soft tissues and spinal canal: No prevertebral fluid or swelling. No visible canal hematoma. There is subcutaneous emphysema that extends inferiorly to the level of the thyroid and superiorly to the level of the periorbital soft tissues. Disc levels:  No evidence of high-grade spinal canal stenosis. Upper chest: Negative. Other: None IMPRESSION: 1. No acute intracranial abnormality. 2. Right orbital floor fracture with herniation of the inferior rectus muscle body through the fracture site. Findings can be seen in the setting of entrapment. Recommend ophthalmologic consultation. 3. Air in the intraconal compartment of the orbit abutting the right optic nerve. No evidence of an intraorbital hematoma. 4. No acute fracture or traumatic  subluxation of the cervical spine. 5. Subcutaneous emphysema that extends inferiorly to the level of the thyroid and superiorly to the level of the periorbital soft tissues. This may be secondary to an additional occult maxillary sinus injury, possibly along the anterior wall. Electronically Signed   By: Lorenza Cambridge M.D.   On: 12/19/2022 12:21   CT Cervical Spine Wo Contrast  Result Date: 12/19/2022 CLINICAL DATA:  fall; Facial trauma, blunt; Neck trauma (Age >= 65y) EXAM: CT HEAD WITHOUT CONTRAST CT MAXILLOFACIAL WITHOUT CONTRAST CT CERVICAL SPINE WITHOUT CONTRAST TECHNIQUE: Multidetector CT imaging of the head, cervical spine, and maxillofacial structures were performed using the standard protocol without intravenous contrast. Multiplanar CT image reconstructions of the cervical spine and maxillofacial structures were also generated. RADIATION DOSE REDUCTION: This exam was performed according to the departmental dose-optimization program which includes automated exposure control, adjustment of the mA and/or kV according to patient size and/or use of iterative reconstruction technique. COMPARISON:  None Available. FINDINGS: CT  HEAD FINDINGS Brain: No evidence of acute infarction, hemorrhage, hydrocephalus, extra-axial collection or mass lesion/mass effect. Vascular: No hyperdense vessel or unexpected calcification. Skull: Normal. Negative for fracture or focal lesion. Other: None. CT MAXILLOFACIAL FINDINGS Osseous: There is a displaced fracture of the orbital floor with herniation of the inferior rectus muscle body through the fracture site. Orbits: There is air in the intraconal compartment of the orbit abutting the right optic nerve. No evidence of an intraorbital hematoma. Right globe is normal in appearance. Bilateral lens replacement. Sinuses: No middle ear or mastoid effusion. Small amount of layering blood products in the right maxillary sinus. Paranasal sinuses clear. Soft tissues: There is  subcutaneous gas extending superiorly to the periorbital soft tissues. CT CERVICAL SPINE FINDINGS Alignment: Normal. Skull base and vertebrae: No acute fracture. No primary bone lesion or focal pathologic process. Soft tissues and spinal canal: No prevertebral fluid or swelling. No visible canal hematoma. There is subcutaneous emphysema that extends inferiorly to the level of the thyroid and superiorly to the level of the periorbital soft tissues. Disc levels:  No evidence of high-grade spinal canal stenosis. Upper chest: Negative. Other: None IMPRESSION: 1. No acute intracranial abnormality. 2. Right orbital floor fracture with herniation of the inferior rectus muscle body through the fracture site. Findings can be seen in the setting of entrapment. Recommend ophthalmologic consultation. 3. Air in the intraconal compartment of the orbit abutting the right optic nerve. No evidence of an intraorbital hematoma. 4. No acute fracture or traumatic subluxation of the cervical spine. 5. Subcutaneous emphysema that extends inferiorly to the level of the thyroid and superiorly to the level of the periorbital soft tissues. This may be secondary to an additional occult maxillary sinus injury, possibly along the anterior wall. Electronically Signed   By: Lorenza Cambridge M.D.   On: 12/19/2022 12:21   CT Maxillofacial WO CM  Result Date: 12/19/2022 CLINICAL DATA:  fall; Facial trauma, blunt; Neck trauma (Age >= 65y) EXAM: CT HEAD WITHOUT CONTRAST CT MAXILLOFACIAL WITHOUT CONTRAST CT CERVICAL SPINE WITHOUT CONTRAST TECHNIQUE: Multidetector CT imaging of the head, cervical spine, and maxillofacial structures were performed using the standard protocol without intravenous contrast. Multiplanar CT image reconstructions of the cervical spine and maxillofacial structures were also generated. RADIATION DOSE REDUCTION: This exam was performed according to the departmental dose-optimization program which includes automated exposure  control, adjustment of the mA and/or kV according to patient size and/or use of iterative reconstruction technique. COMPARISON:  None Available. FINDINGS: CT HEAD FINDINGS Brain: No evidence of acute infarction, hemorrhage, hydrocephalus, extra-axial collection or mass lesion/mass effect. Vascular: No hyperdense vessel or unexpected calcification. Skull: Normal. Negative for fracture or focal lesion. Other: None. CT MAXILLOFACIAL FINDINGS Osseous: There is a displaced fracture of the orbital floor with herniation of the inferior rectus muscle body through the fracture site. Orbits: There is air in the intraconal compartment of the orbit abutting the right optic nerve. No evidence of an intraorbital hematoma. Right globe is normal in appearance. Bilateral lens replacement. Sinuses: No middle ear or mastoid effusion. Small amount of layering blood products in the right maxillary sinus. Paranasal sinuses clear. Soft tissues: There is subcutaneous gas extending superiorly to the periorbital soft tissues. CT CERVICAL SPINE FINDINGS Alignment: Normal. Skull base and vertebrae: No acute fracture. No primary bone lesion or focal pathologic process. Soft tissues and spinal canal: No prevertebral fluid or swelling. No visible canal hematoma. There is subcutaneous emphysema that extends inferiorly to the level of the thyroid and superiorly  to the level of the periorbital soft tissues. Disc levels:  No evidence of high-grade spinal canal stenosis. Upper chest: Negative. Other: None IMPRESSION: 1. No acute intracranial abnormality. 2. Right orbital floor fracture with herniation of the inferior rectus muscle body through the fracture site. Findings can be seen in the setting of entrapment. Recommend ophthalmologic consultation. 3. Air in the intraconal compartment of the orbit abutting the right optic nerve. No evidence of an intraorbital hematoma. 4. No acute fracture or traumatic subluxation of the cervical spine. 5.  Subcutaneous emphysema that extends inferiorly to the level of the thyroid and superiorly to the level of the periorbital soft tissues. This may be secondary to an additional occult maxillary sinus injury, possibly along the anterior wall. Electronically Signed   By: Lorenza Cambridge M.D.   On: 12/19/2022 12:21     PROCEDURES:  Critical Care performed: YES  CRITICAL CARE Performed by: Lissa Hoard  Total critical care time: 45 minutes  Critical care time was exclusive of separately billable procedures and treating other patients.  Critical care was necessary to treat or prevent imminent or life-threatening deterioration.  Critical care was time spent personally by me on the following activities: development of treatment plan with patient and/or surrogate as well as nursing, discussions with consultants, evaluation of patient's response to treatment, examination of patient, obtaining history from patient or surrogate, ordering and performing treatments and interventions, ordering and review of laboratory studies, ordering and review of radiographic studies, pulse oximetry and re-evaluation of patient's condition.   Procedures   MEDICATIONS ORDERED IN ED: Medications - No data to display   IMPRESSION / MDM / ASSESSMENT AND PLAN / ED COURSE  I reviewed the triage vital signs and the nursing notes.                              Differential diagnosis includes, but is not limited to, closed head injury, SDH, facial fracture, orbital bone fracture, nasal fracture, cervical strain, cervical fracture  Patient's presentation is most consistent with acute complicated illness / injury requiring diagnostic workup.  ----------------------------------------- 12:30 PM on 12/19/2022 ----------------------------------------- S/W Dr. Inez Pilgrim (OPTH): he suggests call to ENT. If patient is not transferred, he will consult, as appropriate  ----------------------------------------- 12:35  PM on 12/19/2022 ----------------------------------------- S/W Dr. Willeen Cass (ENT): he suggest transfer to [facility], and local providers do not do perform orbital fracture repair.   ----------------------------------------- 1:02 PM on 12/19/2022 ----------------------------------------- S/W Dr. Rainey Pines, MD [facility]ial [facility]): Will be willing to accept the patient in transfer, but reports that [facility] currently has no beds or capacity for excepting patients.  ----------------------------------------- 2:40 PM on 12/19/2022 ----------------------------------------- S/W Ronnette Juniper [facility] ENT): He reviewed images after consulting with his attending, and deferred direct x-ray at this time.  Stated that they would be able to manage the patient as an outpatient and defer surgery.  When asked if outpatient follow-up could be arranged for tomorrow, he replied that it would not be necessary emergently.  He also advised that surgical intervention consult would be requested to determine necessity of emergent transfer.  I thanked him for his return call.   ----------------------------------------- 3:20 PM on 12/19/2022 ----------------------------------------- S/W Dr. Irene Pap [facility] ENT Attending): She called to confirm and verify something was relayed by her PA [place].  She suggested ophthalmology evaluation as well as an ENT consult in the ED, to confirm if one of the 2 indications for emergent surgical intervention  existed.  Ultimately she said even if clinical entrapment was confirmed and emergent ophthalmology evaluation was indicated for surgery, she would still recommend referral to [facility] for this patient. I thanked her for her time and consideration with the call-back.   ----------------------------------------- 8:31 PM on 12/19/2022 ----------------------------------------- S/W Dr. Milus Banister [facility] ENT): he will accept the patient ED-ED for expected surgical intervention in the morning. Patient and friend  at bedside agreeable to plan of care.   Patient's diagnosis is consistent with chemical fall resulting and a left orbital floor fracture with entrapment of the inferior rectus muscles. Patient will be transferred to [facility] ED as discussed.    FINAL CLINICAL IMPRESSION(S) / ED DIAGNOSES   Final diagnoses:  Contusion of face, initial encounter  Fall in home, initial encounter  Closed blow-out fracture of right orbital floor [facility])     Rx / DC Orders   ED Discharge Orders     None        Note:  This document was prepared using Dragon voice recognition software and may include unintentional dictation errors.    Lissa Hoard, PA-C 12/19/22 2033    Minna Antis, MD 12/21/22 Mikle Bosworth

## 2022-12-19 NOTE — ED Triage Notes (Signed)
Pt to ED via [facility] from [facility]. Pt reports she tripped and fell over her cat.Pt hit right eye. Pt's eye swollen shut with redness and swelling. Pt denies LOC. Pt denies blood thinners. Pt reports double vision after the fall that has subsided. Pt went to [facility] after fall and was sent over due to BP being 190/100. Pt took BP meds after fell. EMS BP 160/70. Pt denies dizzines, HA or visual changes at this time.

## 2022-12-19 NOTE — Discharge Instructions (Signed)
You are being sent to the emergency department via EMS.

## 2022-12-19 NOTE — ED Provider Notes (Signed)
-----------------------------------------   3:06 PM on 12/19/2022 ----------------------------------------- I have seen and evaluated the patient.  Patient has significant periorbital edema of the right eye to the point where it is swollen shut unless manually opened.  Difficult to ascertain extraocular movements but it appears limited especially with upward gaze in the right eye.  CT scan show orbital floor fracture with herniation of the inferior rectus muscle.  Attempting to transfer, [facility] was unable to take the patient, [facility] could not take the patient but would see in clinic.  We will discuss with [facility] at [facility] to see if they are able to take the patient in transfer.  Patient agreeable to plan of care.   Minna Antis, MD 12/19/22 1513

## 2022-12-20 DIAGNOSIS — S02831A Fracture of medial orbital wall, right side, initial encounter for closed fracture: Secondary | ICD-10-CM | POA: Diagnosis not present

## 2022-12-20 DIAGNOSIS — W19XXXA Unspecified fall, initial encounter: Secondary | ICD-10-CM | POA: Diagnosis not present

## 2022-12-20 DIAGNOSIS — S0230XA Fracture of orbital floor, unspecified side, initial encounter for closed fracture: Secondary | ICD-10-CM | POA: Diagnosis not present

## 2022-12-20 DIAGNOSIS — H50639 Inferior rectus muscle entrapment, unspecified eye: Secondary | ICD-10-CM | POA: Diagnosis not present

## 2022-12-20 DIAGNOSIS — W01198A Fall on same level from slipping, tripping and stumbling with subsequent striking against other object, initial encounter: Secondary | ICD-10-CM | POA: Diagnosis not present

## 2022-12-20 DIAGNOSIS — H50631 Inferior rectus muscle entrapment, right eye: Secondary | ICD-10-CM | POA: Diagnosis not present

## 2022-12-20 DIAGNOSIS — S0285XA Fracture of orbit, unspecified, initial encounter for closed fracture: Secondary | ICD-10-CM | POA: Diagnosis not present

## 2022-12-20 DIAGNOSIS — S0591XS Unspecified injury of right eye and orbit, sequela: Secondary | ICD-10-CM | POA: Diagnosis not present

## 2022-12-20 DIAGNOSIS — Y92009 Unspecified place in unspecified non-institutional (private) residence as the place of occurrence of the external cause: Secondary | ICD-10-CM | POA: Diagnosis not present

## 2022-12-20 DIAGNOSIS — Y92019 Unspecified place in single-family (private) house as the place of occurrence of the external cause: Secondary | ICD-10-CM | POA: Diagnosis not present

## 2022-12-20 DIAGNOSIS — W01119S Fall on same level from slipping, tripping and stumbling with subsequent striking against unspecified sharp object, sequela: Secondary | ICD-10-CM | POA: Diagnosis not present

## 2022-12-24 ENCOUNTER — Emergency Department
Admission: EM | Admit: 2022-12-24 | Discharge: 2022-12-26 | Disposition: A | Discharge status: Skilled Nursing Facility | Payer: PPO | Attending: Emergency Medicine | Admitting: Emergency Medicine

## 2022-12-24 ENCOUNTER — Emergency Department: Payer: PPO

## 2022-12-24 ENCOUNTER — Other Ambulatory Visit: Payer: Self-pay

## 2022-12-24 ENCOUNTER — Encounter: Payer: Self-pay | Admitting: Emergency Medicine

## 2022-12-24 DIAGNOSIS — S76311A Strain of muscle, fascia and tendon of the posterior muscle group at thigh level, right thigh, initial encounter: Secondary | ICD-10-CM | POA: Diagnosis not present

## 2022-12-24 DIAGNOSIS — J449 Chronic obstructive pulmonary disease, unspecified: Secondary | ICD-10-CM | POA: Diagnosis not present

## 2022-12-24 DIAGNOSIS — W19XXXA Unspecified fall, initial encounter: Secondary | ICD-10-CM

## 2022-12-24 DIAGNOSIS — R001 Bradycardia, unspecified: Secondary | ICD-10-CM | POA: Diagnosis not present

## 2022-12-24 DIAGNOSIS — E119 Type 2 diabetes mellitus without complications: Secondary | ICD-10-CM | POA: Diagnosis not present

## 2022-12-24 DIAGNOSIS — M1611 Unilateral primary osteoarthritis, right hip: Secondary | ICD-10-CM | POA: Diagnosis not present

## 2022-12-24 DIAGNOSIS — M79651 Pain in right thigh: Secondary | ICD-10-CM | POA: Diagnosis not present

## 2022-12-24 DIAGNOSIS — Z8542 Personal history of malignant neoplasm of other parts of uterus: Secondary | ICD-10-CM | POA: Diagnosis not present

## 2022-12-24 DIAGNOSIS — M25551 Pain in right hip: Secondary | ICD-10-CM | POA: Diagnosis not present

## 2022-12-24 DIAGNOSIS — M1711 Unilateral primary osteoarthritis, right knee: Secondary | ICD-10-CM | POA: Diagnosis not present

## 2022-12-24 DIAGNOSIS — Z794 Long term (current) use of insulin: Secondary | ICD-10-CM | POA: Diagnosis not present

## 2022-12-24 DIAGNOSIS — Y92002 Bathroom of unspecified non-institutional (private) residence single-family (private) house as the place of occurrence of the external cause: Secondary | ICD-10-CM | POA: Diagnosis not present

## 2022-12-24 DIAGNOSIS — I10 Essential (primary) hypertension: Secondary | ICD-10-CM | POA: Diagnosis not present

## 2022-12-24 DIAGNOSIS — Z043 Encounter for examination and observation following other accident: Secondary | ICD-10-CM | POA: Diagnosis not present

## 2022-12-24 DIAGNOSIS — W010XXA Fall on same level from slipping, tripping and stumbling without subsequent striking against object, initial encounter: Secondary | ICD-10-CM | POA: Diagnosis not present

## 2022-12-24 DIAGNOSIS — M79606 Pain in leg, unspecified: Secondary | ICD-10-CM | POA: Diagnosis not present

## 2022-12-24 DIAGNOSIS — M47816 Spondylosis without myelopathy or radiculopathy, lumbar region: Secondary | ICD-10-CM | POA: Diagnosis not present

## 2022-12-24 DIAGNOSIS — S79911A Unspecified injury of right hip, initial encounter: Secondary | ICD-10-CM | POA: Diagnosis not present

## 2022-12-24 DIAGNOSIS — M160 Bilateral primary osteoarthritis of hip: Secondary | ICD-10-CM | POA: Diagnosis not present

## 2022-12-24 LAB — CBG MONITORING, ED: Glucose-Capillary: 210 mg/dL — ABNORMAL HIGH (ref 70–99)

## 2022-12-24 MED ORDER — OXYCODONE HCL 5 MG PO TABS
5.0000 mg | ORAL_TABLET | Freq: Once | ORAL | Status: AC
  Administered 2022-12-24: 5 mg via ORAL
  Filled 2022-12-24: qty 1

## 2022-12-24 MED ORDER — AMLODIPINE BESYLATE 5 MG PO TABS
10.0000 mg | ORAL_TABLET | Freq: Once | ORAL | Status: AC
  Administered 2022-12-24: 10 mg via ORAL
  Filled 2022-12-24: qty 2

## 2022-12-24 MED ORDER — CLONIDINE HCL 0.1 MG PO TABS
0.2000 mg | ORAL_TABLET | Freq: Once | ORAL | Status: AC
  Administered 2022-12-24: 0.2 mg via ORAL
  Filled 2022-12-24: qty 2

## 2022-12-24 MED ORDER — ACETAMINOPHEN 500 MG PO TABS
1000.0000 mg | ORAL_TABLET | Freq: Once | ORAL | Status: AC
  Administered 2022-12-24: 1000 mg via ORAL
  Filled 2022-12-24: qty 2

## 2022-12-24 NOTE — ED Triage Notes (Signed)
Pt BIB EMS from home after tripping over her cat in the bathroom. Pt c/o right upper leg pain. Pt missing her left great toenail. Denies hitting her head or LOC.

## 2022-12-24 NOTE — TOC CM/SW Note (Signed)
Cm received TOC consult for DME/SNF/and HH. Cm awaiting PT/OT evaluations for recommendations. Cm continue to follow for toc needs.

## 2022-12-24 NOTE — ED Notes (Signed)
Attempted to get pt up to toilet. Pt unable to get out of bed. Pt used bedpan.

## 2022-12-24 NOTE — ED Notes (Signed)
PT has an appt at [facility] on Wednesday 8/28 for her eye and an appt at the [facility] on Wednesday too. Pt is very concerned about missing these or making sure they are rescheduled.

## 2022-12-24 NOTE — ED Notes (Signed)
Pt sleeping. Will take pts temperature when they wake up.

## 2022-12-24 NOTE — ED Notes (Signed)
Pt to xray

## 2022-12-24 NOTE — ED Provider Notes (Signed)
[facility] Provider Note    Event Date/Time   First MD Initiated Contact with Patient 12/24/22 0700     (approximate)   History   Fall   HPI  Terri [name] is a 72 y.o. female  who presents to the emergency department today because of concern for right thigh pain after a fall. The patient was in the bathroom when she tripped and fell. Denies hitting her head. States that when she tried to get up she was not able to stand or walk because of the pain to her right thigh. Denies any recent illness, but does state she had a recent fall that caused injury to her right eye. Per chart review did suffer an orbital fracture.       Physical Exam   Triage Vital Signs: ED Triage Vitals  Encounter Vitals Group     BP 12/24/22 0634 (!) 199/64     Systolic BP Percentile --      Diastolic BP Percentile --      Pulse Rate 12/24/22 0634 (!) 46     Resp 12/24/22 0634 18     Temp 12/24/22 0634 98 F (36.7 C)     Temp src --      SpO2 12/24/22 0634 98 %     Weight 12/24/22 0634 192 lb 14.4 oz (87.5 kg)     Height 12/24/22 0634 5\' 1"  (1.549 m)     Head Circumference --      Peak Flow --      Pain Score 12/24/22 0633 4     Pain Loc --      Pain Education --      Exclude from Growth Chart --     Most recent vital signs: Vitals:   12/24/22 0634 12/24/22 0645  BP: (!) 199/64   Pulse: (!) 46 (!) 46  Resp: 18 17  Temp: 98 F (36.7 C)   SpO2: 98% 96%   General: Awake, alert, oriented. CV:  Good peripheral perfusion. Bradycardia Resp:  Normal effort. Lungs clear. Abd:  No distention.    ED Results / Procedures / Treatments   Labs (all labs ordered are listed, but only abnormal results are displayed) Labs Reviewed - No data to display   EKG  I, Phineas Semen, attending physician, personally viewed and interpreted this EKG  EKG Time: 0638 Rate: 46 Rhythm: sinus bradycardia Axis: left axis deviation QRS: RBBB, LAFB ST changes: no st  elevation Impression: abnormal ekg   RADIOLOGY I independently interpreted and visualized the pelvis x-ray. My interpretation: no fracture Radiology interpretation:  IMPRESSION:  Negative.    I independently interpreted and visualized the right femur x-ray. My interpretation: no fracture Radiology interpretation:  IMPRESSION:  Negative.    I independently interpreted and visualized the ct right hip. My interpretation: no fracture Radiology interpretation:  IMPRESSION:  1. No evidence of acute right hip fracture or dislocation.  2. Mild right hip osteoarthritis.  3. Possible soft tissue injury involving proximal right thigh  adductor musculature.      PROCEDURES:  Critical Care performed: No    MEDICATIONS ORDERED IN ED: Medications - No data to display   IMPRESSION / MDM / ASSESSMENT AND PLAN / ED COURSE  I reviewed the triage vital signs and the nursing notes.                              Differential diagnosis includes,  but is not limited to, fracture, dislocation, bruise, soft tissue injury.  Patient's presentation is most consistent with acute presentation with potential threat to life or bodily function.  Patient presented to emergency department today after mechanical fall and right upper leg pain.  On exam patient with some slight tenderness around the right hip.  X-rays of the pelvis and right femur were negative.  Given difficulty with ambulation a CT scan was obtained.  This was negative for fracture.  Patient was given narcotic pain medication however even after narcotic pain medication patient was unable to ambulate or bear weight.  Patient apparently lives alone.  This time I would have concerns about safety of patient if she was to be discharged home.  Will have social work and PT evaluate.   FINAL CLINICAL IMPRESSION(S) / ED DIAGNOSES   Final diagnoses:  Fall, initial encounter  Right thigh pain      Note:  This document was prepared using  Dragon voice recognition software and may include unintentional dictation errors.    Phineas Semen, MD 12/24/22 [phone]

## 2022-12-24 NOTE — ED Notes (Signed)
Pt assisted to edge of the bed. Pt hollering out in pain when attempting to stand. Stated she can not do it. PT was encouraged to stand and see if that eased the pain but pt declined to do so.  Pain is minimal though when pt is lying still. MD made aware.

## 2022-12-24 NOTE — ED Notes (Signed)
Assisted pt to bathroom to urinate. Pt unable to tolerate standing and general movement of R leg - screaming out in pain.

## 2022-12-24 NOTE — ED Notes (Signed)
Raynelle Fanning, emergency contact in pt's chart, was called and updated.

## 2022-12-24 NOTE — ED Notes (Signed)
Pt assisted to edge of the bed.  Pt hollering out in pain when attempting to stand. Stated she can not do it. Pain is minimal though when pt is lying still. I got another RN to assist and pt still stated she could not stand. MD made aware.

## 2022-12-25 ENCOUNTER — Emergency Department: Payer: PPO

## 2022-12-25 DIAGNOSIS — M47816 Spondylosis without myelopathy or radiculopathy, lumbar region: Secondary | ICD-10-CM | POA: Diagnosis not present

## 2022-12-25 DIAGNOSIS — S79911A Unspecified injury of right hip, initial encounter: Secondary | ICD-10-CM | POA: Diagnosis not present

## 2022-12-25 DIAGNOSIS — M160 Bilateral primary osteoarthritis of hip: Secondary | ICD-10-CM | POA: Diagnosis not present

## 2022-12-25 DIAGNOSIS — S76311A Strain of muscle, fascia and tendon of the posterior muscle group at thigh level, right thigh, initial encounter: Secondary | ICD-10-CM | POA: Diagnosis not present

## 2022-12-25 LAB — CBG MONITORING, ED
Glucose-Capillary: 363 mg/dL — ABNORMAL HIGH (ref 70–99)
Glucose-Capillary: 247 mg/dL — ABNORMAL HIGH (ref 70–99)

## 2022-12-25 LAB — HEMOGLOBIN A1C
Hgb A1c MFr Bld: 7.2 % — ABNORMAL HIGH (ref 4.8–5.6)
Mean Plasma Glucose: 159.94 mg/dL

## 2022-12-25 MED ORDER — MOMETASONE FURO-FORMOTEROL FUM 200-5 MCG/ACT IN AERO
2.0000 | INHALATION_SPRAY | Freq: Two times a day (BID) | RESPIRATORY_TRACT | Status: DC
  Filled 2022-12-25: qty 8.8

## 2022-12-25 MED ORDER — BUPROPION HCL ER (XL) 150 MG PO TB24
300.0000 mg | ORAL_TABLET | Freq: Every day | ORAL | Status: DC
  Administered 2022-12-25 – 2022-12-26 (×2): 300 mg via ORAL
  Filled 2022-12-25 (×2): qty 2

## 2022-12-25 MED ORDER — ONDANSETRON HCL 4 MG PO TABS: 8.0000 mg | ORAL_TABLET | Freq: Three times a day (TID) | ORAL | Status: DC | PRN

## 2022-12-25 MED ORDER — GLIPIZIDE ER 10 MG PO TB24
10.0000 mg | ORAL_TABLET | Freq: Every day | ORAL | Status: DC
  Administered 2022-12-25 – 2022-12-26 (×2): 10 mg via ORAL
  Filled 2022-12-25 (×3): qty 1

## 2022-12-25 MED ORDER — MONTELUKAST SODIUM 10 MG PO TABS
10.0000 mg | ORAL_TABLET | Freq: Every day | ORAL | Status: DC
  Administered 2022-12-25: 10 mg via ORAL
  Filled 2022-12-25: qty 1

## 2022-12-25 MED ORDER — ATORVASTATIN CALCIUM 20 MG PO TABS
80.0000 mg | ORAL_TABLET | Freq: Every day | ORAL | Status: DC
  Administered 2022-12-25 – 2022-12-26 (×2): 80 mg via ORAL
  Filled 2022-12-25 (×2): qty 4

## 2022-12-25 MED ORDER — VITAMIN D 25 MCG (1000 UNIT) PO TABS
500.0000 [IU] | ORAL_TABLET | Freq: Every day | ORAL | Status: DC
  Administered 2022-12-25 – 2022-12-26 (×2): 500 [IU] via ORAL
  Filled 2022-12-25 (×2): qty 1

## 2022-12-25 MED ORDER — HYDROCHLOROTHIAZIDE 25 MG PO TABS: 25.0000 mg | ORAL_TABLET | Freq: Every day | ORAL | Status: DC

## 2022-12-25 MED ORDER — ALBUTEROL SULFATE (2.5 MG/3ML) 0.083% IN NEBU: 3.0000 mL | INHALATION_SOLUTION | Freq: Four times a day (QID) | RESPIRATORY_TRACT | Status: DC | PRN

## 2022-12-25 MED ORDER — AMLODIPINE BESYLATE 5 MG PO TABS: 10.0000 mg | ORAL_TABLET | Freq: Every day | ORAL | Status: DC

## 2022-12-25 MED ORDER — INSULIN ASPART 100 UNIT/ML IJ SOLN: 0.0000 [IU] | Freq: Every day | INTRAMUSCULAR | Status: DC

## 2022-12-25 MED ORDER — OXYCODONE HCL 5 MG PO TABS
5.0000 mg | ORAL_TABLET | ORAL | Status: DC | PRN
  Administered 2022-12-26: 5 mg via ORAL
  Filled 2022-12-25: qty 1

## 2022-12-25 MED ORDER — PANTOPRAZOLE SODIUM 20 MG PO TBEC
20.0000 mg | DELAYED_RELEASE_TABLET | Freq: Every day | ORAL | Status: DC
  Administered 2022-12-26: 20 mg via ORAL
  Filled 2022-12-25 (×2): qty 1

## 2022-12-25 MED ORDER — CLONIDINE HCL 0.1 MG PO TABS: 0.2000 mg | ORAL_TABLET | Freq: Two times a day (BID) | ORAL | Status: DC

## 2022-12-25 MED ORDER — INSULIN DETEMIR 100 UNIT/ML ~~LOC~~ SOLN
25.0000 [IU] | Freq: Every day | SUBCUTANEOUS | Status: DC
  Administered 2022-12-25: 25 [IU] via SUBCUTANEOUS
  Filled 2022-12-25 (×2): qty 0.25

## 2022-12-25 MED ORDER — CITALOPRAM HYDROBROMIDE 20 MG PO TABS
20.0000 mg | ORAL_TABLET | Freq: Every day | ORAL | Status: DC
  Administered 2022-12-25 – 2022-12-26 (×2): 20 mg via ORAL
  Filled 2022-12-25 (×2): qty 1

## 2022-12-25 MED ORDER — LOSARTAN POTASSIUM-HCTZ 100-25 MG PO TABS: 1.0000 | ORAL_TABLET | Freq: Every day | ORAL | Status: DC

## 2022-12-25 MED ORDER — METOPROLOL SUCCINATE ER 50 MG PO TB24: 50.0000 mg | ORAL_TABLET | Freq: Every day | ORAL | Status: DC

## 2022-12-25 MED ORDER — ACETAMINOPHEN 325 MG PO TABS
650.0000 mg | ORAL_TABLET | Freq: Four times a day (QID) | ORAL | Status: DC | PRN
  Administered 2022-12-25 (×2): 650 mg via ORAL
  Filled 2022-12-25 (×2): qty 2

## 2022-12-25 MED ORDER — LOSARTAN POTASSIUM 50 MG PO TABS: 100.0000 mg | ORAL_TABLET | Freq: Every day | ORAL | Status: DC

## 2022-12-25 MED ORDER — INSULIN ASPART 100 UNIT/ML IJ SOLN
0.0000 [IU] | Freq: Three times a day (TID) | INTRAMUSCULAR | Status: DC
  Administered 2022-12-25: 5 [IU] via SUBCUTANEOUS
  Administered 2022-12-25: 15 [IU] via SUBCUTANEOUS
  Administered 2022-12-26: 11 [IU] via SUBCUTANEOUS
  Administered 2022-12-26: 5 [IU] via SUBCUTANEOUS
  Filled 2022-12-25 (×4): qty 1

## 2022-12-25 NOTE — TOC Initial Note (Addendum)
Transition of Care [facility]) - Initial/Assessment Note    Patient Details  Name: Terri [name] MRN: [id number] Date of Birth: [birth year]  Transition of Care [facility]) CM/SW Contact:    Terri Shropshire, RN Phone Number: 12/25/2022, 11:16 AM  Clinical Narrative:                  Cm awaiting on PT recommendations  Pt arrived from ED from: Home Caregiver Support: Terri [name] Friend DME at Home: Gilmer Mor Transportation: Friend  Previous Services: None HH/SNF Preference: [employer] First Person of Contact: Terri [name] PCP: Terri Beecham, MD  1202-Cm having difficulty with [facility]. It is saying the demographics are not correct. Cm going to verify Pt demographics.  1236-[facility] completed. PT recommends SNF. Cm started bed search. Pt has preference for [facility] but can go to any facility.  1333- [facility] made bed offer and cm called Terri [name] with Admissions at [facility]. Terri [name] stated they have a bed available for STR. Cm talked with pt and she agreed to have [facility]. Cm called Terri [name] with health team advantage to start authorization. Terri [name] stated someone will give me a call when they have a decision   Cm will continue to follow for toc needs and d/c planning.    Barriers to Discharge: Continued Medical Work up   Patient Goals and CMS Choice   CMS Medicare.gov Compare Post Acute Care list provided to:: Patient Choice offered to / list presented to : Patient      Expected Discharge Plan and Services     Post Acute Care Choice: Home Health, Skilled Nursing Facility Living arrangements for the past 2 months: Single Family Home                                      Prior Living Arrangements/Services Living arrangements for the past 2 months: Single Family Home Lives with:: Self            Care giver support system in place?: Yes (comment) Current home services: DME    Activities of Daily Living      Permission Sought/Granted                   Emotional Assessment Appearance:: Appears younger than stated age Attitude/Demeanor/Rapport: Engaged Affect (typically observed): Calm Orientation: : Oriented to Self, Oriented to Place, Oriented to  Time, Oriented to Situation      Admission diagnosis:  Fall Patient Active Problem List   Diagnosis Date Noted   Anemia 09/28/2022   Endometrial cancer (HCC) 01/25/2022   Mild aortic stenosis 10/12/2020   Essential hypertension 11/05/2017   Closed fracture of left ankle 03/29/2017   Ankle fracture, left 03/26/2017   Acute respiratory failure with hypoxia (HCC) 08/05/2015   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   Hyperlipemia, mixed 04/05/2015   HTN, goal below 140/80 04/05/2015   Obesity (BMI 30-39.9) 04/05/2015   Type 2 diabetes mellitus with complication, with long-term current use of insulin (HCC) 04/05/2015   Class 2 severe obesity due to excess calories with serious comorbidity in adult (HCC) 08/19/2013   Obesity, Class II, BMI 35-39.9, with comorbidity 08/19/2013   Chronic obstructive pulmonary disease, unspecified (HCC) 07/31/2012   Claudication (HCC) 03/12/2012   Tobacco use 03/12/2012   Hyperlipidemia associated with type 2 diabetes mellitus (HCC) 06/06/2011   Hypertension associated with diabetes (HCC) 06/06/2011   Major depression 06/06/2011   Type  2 diabetes mellitus without complication (HCC) 06/06/2011   PCP:  Terri Arbour, MD Pharmacy:   [facility] - [place], [place] - [address]enee [place]er [place] [address] Phone: [phone] Fax: [phone]  [facility] [address] [place] [address] Phone: [phone] Fax: [phone]     Social Determinants of Health (SDOH) Social History: SDOH Screenings   Food Insecurity: No Food Insecurity (12/20/2022)   Received from [facility]  Housing: Low Risk  (05/30/2022)  Transportation Needs: Unknown (12/20/2022)    Received from [facility]  Utilities: Not At Risk (12/20/2022)   Received from [facility]  Alcohol Screen: Low Risk  (09/20/2022)  Depression (PHQ2-9): Low Risk  (05/30/2022)  Financial Resource Strain: Low Risk  (12/20/2022)   Received from [facility]  Physical Activity: Inactive (09/20/2022)  Social Connections: Socially Isolated (09/20/2022)  Stress: Stress Concern Present (09/20/2022)  Tobacco Use: Medium Risk (12/24/2022)   SDOH Interventions:     Readmission Risk Interventions     No data to display

## 2022-12-25 NOTE — Inpatient Diabetes Management (Signed)
Inpatient Diabetes Program Recommendations  AACE/ADA: New Consensus Statement on Inpatient Glycemic Control (2015)  Target Ranges:  Prepandial:   less than 140 mg/dL      [facility] postprandial:   less than 180 mg/dL (1-2 hours)      Critically ill patients:  140 - 180 mg/dL   Lab Results  Component Value Date   GLUCAP 363 (H) 12/25/2022    Review of Glycemic Control  Latest Reference Range & Units 12/24/22 16:03 12/25/22 12:17  Glucose-Capillary 70 - 99 mg/dL 299 (H) 371 (H)   Diabetes history: DM 2 Outpatient Diabetes medications:  Glucotrol XL 10 mg daily Novolog 7 units tid with meals Levemir 25 units bid Current orders for Inpatient glycemic control:  Novolog 0-15 units tid with meals and HS Levemir 25 units q HS Glucotrol 10 mg daily  Inpatient Diabetes Program Recommendations:    Agree with restart of Levemir insulin.  Consider adding Novolog meal coverage 4 units tid with meals (hold if patient eats less than 50% or NPO).   Thanks,  Beryl Meager, RN, BC-ADM Inpatient Diabetes Coordinator Pager [phone]  (8a-5p)

## 2022-12-25 NOTE — ED Notes (Signed)
Pt requesting to have her toe cleaned. Pt provided with peroxide and warm wash cloths and dry wash cloths.

## 2022-12-25 NOTE — ED Provider Notes (Signed)
Emergency Medicine Observation Re-evaluation Note  Terri [name] is a 72 y.o. female, seen on rounds today.  Pt initially presented to the ED for complaints of Fall  Currently, the patient is resting in bed. No reported issues from nursing team.   Physical Exam  BP 97/79 (BP Location: Right Arm)   Pulse 66   Temp 98 F (36.7 C) (Oral)   Resp 19   Ht 5\' 1"  (1.549 m)   Wt 87.5 kg   SpO2 94%   BMI 36.45 kg/m  Physical Exam General: Resting in bed  ED Course / MDM  EKG:EKG Interpretation Date/Time:  Monday December 24 2022 06:38:54 EDT Ventricular Rate:  46 PR Interval:  162 QRS Duration:  168 QT Interval:  508 QTC Calculation: 445 R Axis:   -71  Text Interpretation: Sinus bradycardia RBBB and LAFB Probable left ventricular hypertrophy Confirmed by UNCONFIRMED, DOCTOR ([id number]), editor Fredric Mare, Tammy [phone]) on 12/24/2022 1:46:15 PM  BGL [id number]  Plan  Current plan is for dispo per social work.    Trinna Post, MD 12/25/22 [phone]

## 2022-12-25 NOTE — ED Notes (Signed)
Assisted pt to bathroom to urinate - pt screaming with any movement and RN having to support pts weight at multiple points during transfers.

## 2022-12-25 NOTE — NC FL2 (Signed)
[place] MEDICAID FL2 LEVEL OF CARE FORM     IDENTIFICATION  Patient Name: Terri [name] Birthdate: 12-Feb-1951 Sex: female Admission Date (Current Location): 12/24/2022  [facility] and [place] Number:  Chiropodist and Address:  [facility], [address], [place], [place] [address]      Provider Number: [id number]  Attending Physician Name and Address:  Donna Bernard, MD  Relative Name and Phone Number:  Acey Lav [phone]    Current Level of Care: Hospital Recommended Level of Care: Skilled Nursing Facility Prior Approval Number:    Date Approved/Denied:   PASRR Number: [id number]  Discharge Plan: SNF    Current Diagnoses: Patient Active Problem List   Diagnosis Date Noted   Anemia 09/28/2022   Endometrial cancer (HCC) 01/25/2022   Mild aortic stenosis 10/12/2020   Essential hypertension 11/05/2017   Closed fracture of left ankle 03/29/2017   Ankle fracture, left 03/26/2017   Acute respiratory failure with hypoxia (HCC) 08/05/2015   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   Hyperlipemia, mixed 04/05/2015   HTN, goal below 140/80 04/05/2015   Obesity (BMI 30-39.9) 04/05/2015   Type 2 diabetes mellitus with complication, with long-term current use of insulin (HCC) 04/05/2015   Class 2 severe obesity due to excess calories with serious comorbidity in adult [facility]) 08/19/2013   Obesity, Class II, BMI 35-39.9, with comorbidity 08/19/2013   Chronic obstructive pulmonary disease, unspecified (HCC) 07/31/2012   Claudication (HCC) 03/12/2012   Tobacco use 03/12/2012   Hyperlipidemia associated with type 2 diabetes mellitus (HCC) 06/06/2011   Hypertension associated with diabetes (HCC) 06/06/2011   Major depression 06/06/2011   Type 2 diabetes mellitus without complication (HCC) 06/06/2011    Orientation RESPIRATION BLADDER Height & Weight     Self, Situation, Time, Place  Normal Continent Weight: 87.5 kg Height:  5\' 1"  (154.9 cm)   BEHAVIORAL SYMPTOMS/MOOD NEUROLOGICAL BOWEL NUTRITION STATUS      Continent Diet (see d/c summary)  AMBULATORY STATUS COMMUNICATION OF NEEDS Skin   Limited Assist Verbally Normal                       Personal Care Assistance Level of Assistance  Bathing, Feeding, Dressing Bathing Assistance: Limited assistance Feeding assistance: Independent Dressing Assistance: Limited assistance     Functional Limitations Info  Sight, Hearing, Speech Sight Info: Adequate Hearing Info: Adequate Speech Info: Adequate    SPECIAL CARE FACTORS FREQUENCY  PT (By licensed PT), OT (By licensed OT)     PT Frequency: x5 min weekly OT Frequency: x5 min weekly            Contractures Contractures Info: Not present    Additional Factors Info  Code Status, Allergies Code Status Info: Not on file Allergies Info: NKDA           Current Medications (12/25/2022):  This is the current hospital active medication list Current Facility-Administered Medications  Medication Dose Route Frequency Provider Last Rate Last Admin   acetaminophen (TYLENOL) tablet 650 mg  650 mg Oral Q6H PRN Pilar Jarvis, MD   650 mg at 12/25/22 0142   albuterol (PROVENTIL) (2.5 MG/3ML) 0.083% nebulizer solution 3 mL  3 mL Inhalation Q6H PRN Merwyn Katos, MD       atorvastatin (LIPITOR) tablet 80 mg  80 mg Oral Daily Merwyn Katos, MD       buPROPion (WELLBUTRIN XL) 24 hr tablet 300 mg  300 mg Oral Daily Bradler, Clent Jacks, MD  cholecalciferol (VITAMIN D3) 25 MCG (1000 UNIT) tablet 500 Units  500 Units Oral Daily Bradler, Clent Jacks, MD       citalopram (CELEXA) tablet 20 mg  20 mg Oral Daily Bradler, Clent Jacks, MD       glipiZIDE (GLUCOTROL XL) 24 hr tablet 10 mg  10 mg Oral Daily Bradler, Clent Jacks, MD       insulin aspart (novoLOG) injection 0-15 Units  0-15 Units Subcutaneous TID WC Bradler, Clent Jacks, MD       insulin aspart (novoLOG) injection 0-5 Units  0-5 Units Subcutaneous QHS Bradler, Clent Jacks, MD       insulin  detemir (LEVEMIR) injection 25 Units  25 Units Subcutaneous QHS Bradler, Clent Jacks, MD       mometasone-formoterol (DULERA) 200-5 MCG/ACT inhaler 2 puff  2 puff Inhalation BID Merwyn Katos, MD       montelukast (SINGULAIR) tablet 10 mg  10 mg Oral QHS Bradler, Clent Jacks, MD       ondansetron (ZOFRAN) tablet 8 mg  8 mg Oral Q8H PRN Merwyn Katos, MD       oxyCODONE (Oxy IR/ROXICODONE) immediate release tablet 5 mg  5 mg Oral Q4H PRN Merwyn Katos, MD       pantoprazole (PROTONIX) EC tablet 20 mg  20 mg Oral Daily Merwyn Katos, MD       Current Outpatient Medications  Medication Sig Dispense Refill   albuterol (PROVENTIL HFA;VENTOLIN HFA) 108 (90 Base) MCG/ACT inhaler Inhale 2 puffs into the lungs every 6 (six) hours as needed for wheezing or shortness of breath.     amLODipine (NORVASC) 10 MG tablet Take 10 mg by mouth daily.     atorvastatin (LIPITOR) 80 MG tablet Take 80 mg by mouth daily.     buPROPion (WELLBUTRIN XL) 300 MG 24 hr tablet Take 300 mg by mouth daily.     cholecalciferol (VITAMIN D) 400 units TABS tablet Take 400 Units by mouth daily.     citalopram (CELEXA) 20 MG tablet Take 20 mg by mouth daily.     cloNIDine (CATAPRES) 0.2 MG tablet Take 0.2 mg by mouth 2 (two) times daily.     glipiZIDE (GLUCOTROL XL) 10 MG 24 hr tablet Take 1 tablet (10 mg total) by mouth daily. Pt dropped bottle and is aware that insurance may not pay yet. May need cash pricing or coupon. Thank you. 30 tablet 0   insulin aspart (NOVOLOG) 100 UNIT/ML injection Inject 13 Units into the skin 3 (three) times daily before meals.     insulin detemir (LEVEMIR FLEXPEN) 100 UNIT/ML FlexPen Inject 25 units in the morning. Inject 23 units in the evening.     ipratropium-albuterol (DUONEB) 0.5-2.5 (3) MG/3ML SOLN Take 3 mLs by nebulization every 6 (six) hours as needed. For shortness of breath 360 mL 1   losartan-hydrochlorothiazide (HYZAAR) 100-25 MG tablet Take 1 tablet by mouth daily.     metoprolol succinate  (TOPROL-XL) 50 MG 24 hr tablet Take 1 tablet by mouth daily.     montelukast (SINGULAIR) 10 MG tablet Take 10 mg by mouth at bedtime.     ondansetron (ZOFRAN) 8 MG tablet Take by mouth.     oxyCODONE (OXY IR/ROXICODONE) 5 MG immediate release tablet Take 5 mg by mouth every 4 (four) hours as needed for severe pain.     pantoprazole (PROTONIX) 20 MG tablet Take 20 mg by mouth daily.     fluticasone-salmeterol (ADVAIR) 250-50 MCG/ACT  AEPB Inhale 1 puff into the lungs in the morning and at bedtime. (Patient not taking: Reported on 12/25/2022)     Insulin Degludec 100 UNIT/ML SOLN Inject into the skin. (Patient not taking: Reported on 12/25/2022)     ONETOUCH VERIO test strip 1 each 3 (three) times daily.       Discharge Medications: Please see discharge summary for a list of discharge medications.  Relevant Imaging Results:  Relevant Lab Results:   Additional Information GMW#[id number]  Kreg Shropshire, RN

## 2022-12-25 NOTE — Evaluation (Signed)
Physical Therapy Evaluation Patient Details Name: Terri [name] MRN: [id number] DOB: [birth year] Today's Date: 12/25/2022  History of Present Illness  Terri [name] is a 71yoF who [facility] ED on 8/26 after tripping over cat, fall sustained, unable to stand 2/2 Rt thigh pain. Rt hip CT negative for fracture/dislocation, questionable for soft tissue injury to adductor musculature. Pt previously here in ED (from [facility]) on 8/21 after fall at home and face impact on table, cat involvement, CT scan show orbital floor fracture with herniation of the inferior rectus muscle, transferred to [facility] ED 8/21.  Clinical Impression  Pt seen in ED hallway, just finished sitting up from supine to EOB, legs dangling. Pt reports pain much improved today, but still not able to use RLE for significant force production during mobility. Pt points to area of pain, Right posterior thigh from ischial tuberosty and distal toward knee, area of linear ecchymosis developing, consistent with patterns seen with acute muscle injury. Pt apprehension during MMT of RLE, pain with RLE hip extension and external rotation, pain with resisted knee flexion. Pt demonstrates ability to rise to standing with RW and TDWB RLE, but requires minA and heavy use of arms. Unable to attempt a pivot transfer or AMB due to height of ED gurney (elevated platform used for STS transfer trial.) GIven available information at this time, CT imaging and exam indicate some degree of acute muscle injury about the right posterior hip/thigh- unclear the degree of injury. Unfortunately even grade I-II muscle strains can be incredible painful and inhibitory to motor function for 2-3 weeks with a longer period of weakness to follow. Anticipate pt will not be able to manage mobility at home entry steps for quite some time. Have asked ED provider Vicente Males) if orthopedics and/or more specific imaging can be obtained to obtain a better prognosis for function.     If plan is  discharge home, recommend the following: A lot of help with walking and/or transfers;Help with stairs or ramp for entrance;Assist for transportation   Can travel by private vehicle   No    Equipment Recommendations None recommended by PT (defer to receiving facility)  Recommendations for Other Services       Functional Status Assessment Patient has had a recent decline in their functional status and demonstrates the ability to make significant improvements in function in a reasonable and predictable amount of time.     Precautions / Restrictions Precautions Precautions: Fall Restrictions Weight Bearing Restrictions: No      Mobility  Bed Mobility Overal bed mobility: Modified Independent             General bed mobility comments: labored but successful supine to/from EOB today, improved since previous day per pt    Transfers Overall transfer level: Needs assistance Equipment used: Rolling walker (2 wheels) Transfers: Sit to/from Stand Sit to Stand: Min assist           General transfer comment: cues to use LLE and BUE only, maintain TDWB RLE t ascertain potential for future transfers and mobility, tolerates static standig 20sec with RW with pain adeqautely controlled.    Ambulation/Gait Ambulation/Gait assistance:  (unsafe to attempt at this time, ED gurney too high.)                Stairs            Wheelchair Mobility     Tilt Bed    Modified Rankin (Stroke Patients Only)  Balance                                             Pertinent Vitals/Pain Pain Assessment Pain Assessment: Faces Faces Pain Scale: Hurts even more Pain Location: Rt posterior hip with resisted hip extension, ER, and or knee flexion Pain Intervention(s): Limited activity within patient's tolerance, Monitored during session, Premedicated before session    Home Living Family/patient expects to be discharged to:: Private residence Living  Arrangements: Alone Available Help at Discharge: Friend(s);Neighbor Type of Home:  (condo) Home Access: Stairs to enter Entrance Stairs-Rails: None Entrance Stairs-Number of Steps: 3   Home Layout: Two level;Able to live on main level with bedroom/bathroom Home Equipment: None      Prior Function               Mobility Comments: typically limitied community AMB without device ADLs Comments: modI to I     Extremity/Trunk Assessment        Lower Extremity Assessment Lower Extremity Assessment: RLE deficits/detail RLE Deficits / Details: echymosis from ischial tuberosity distal ~6-7 inches in a linear fashion along the pathway of the hamstrings, speckled purple dots, anticipate more to come RLE: Unable to fully assess due to pain (difficulty but success in MMT of LLE, but RLE is more cognitively limited. Rt hip ER is 4+/5 and painful, IR 5/5non-provoking, Rt knee flexion is 4-/5  and painful. (all 5/5 on left)) RLE Sensation: WNL       Communication      Cognition                                                General Comments      Exercises     Assessment/Plan    PT Assessment Patient needs continued PT services  PT Problem List Decreased strength;Decreased activity tolerance;Decreased balance;Decreased mobility       PT Treatment Interventions DME instruction;Gait training;Stair training;Functional mobility training;Therapeutic activities;Patient/family education;Cognitive remediation;Neuromuscular re-education;Balance training;Therapeutic exercise;Wheelchair mobility training;Modalities    PT Goals (Current goals can be found in the Care Plan section)  Acute Rehab PT Goals Patient Stated Goal: return to living alone at home, caring for 2 cats PT Goal Formulation: With patient Time For Goal Achievement: 01/08/23 Potential to Achieve Goals: Fair    Frequency Min 1X/week     Co-evaluation               AM-PAC PT "6 Clicks"  Mobility  Outcome Measure Help needed turning from your back to your side while in a flat bed without using bedrails?: A Little Help needed moving from lying on your back to sitting on the side of a flat bed without using bedrails?: A Little Help needed moving to and from a bed to a chair (including a wheelchair)?: A Lot Help needed standing up from a chair using your arms (e.g., wheelchair or bedside chair)?: A Lot Help needed to walk in hospital room?: A Lot Help needed climbing 3-5 steps with a railing? : Total 6 Click Score: 13    End of Session Equipment Utilized During Treatment: Gait belt Activity Tolerance: Patient tolerated treatment well;Patient limited by pain Patient left: in bed;with call bell/phone within reach   PT Visit Diagnosis: History of  falling (Z91.81);Other abnormalities of gait and mobility (R26.89);Muscle weakness (generalized) (M62.81);Pain Pain - Right/Left: Right Pain - part of body: Hip    Time: 6644-0347 PT Time Calculation (min) (ACUTE ONLY): 18 min   Charges:   PT Evaluation $PT Eval Moderate Complexity: 1 Mod   PT General Charges $$ ACUTE PT VISIT: 1 Visit       12:11 PM, 12/25/22 Rosamaria Lints, PT, DPT Physical Therapist - [facility]  [phone] (ASCOM)    Janal Haak C 12/25/2022, 12:02 PM

## 2022-12-25 NOTE — ED Provider Notes (Signed)
Notified by physical therapy that they had evaluated the patient.  Earlier today, they reached out to Dr. Elige Radon for consideration of MRI of her hip. This was ordered and demonstrated a complete rupture of her right common hamstring tendon with surrounding soft tissue edema. Contacted by Alvera Novel with PT who recommended consultation of orthopedics regarding this injury, specifically to see if they recommended operative intervention.  Case was reviewed with Dr. Joice Lofts. He recommends nonoperative management for this patient.  Continue plans for disposition per social work.   Trinna Post, MD 12/25/22 [phone]

## 2022-12-25 NOTE — ED Notes (Signed)
Pt currently in MRI

## 2022-12-25 NOTE — ED Provider Notes (Signed)
Emergency Medicine Observation Re-evaluation Note  Physical Exam   BP (!) 135/37   Pulse (!) 51   Temp 97.8 F (36.6 C)   Resp 18   Ht 5\' 1"  (1.549 m)   Wt 87.5 kg   SpO2 94%   BMI 36.45 kg/m   Patient appears in no acute distress.  ED Course / MDM   No reported events during my shift at the time of this note.   Pt is awaiting dispo from SW   Pilar Jarvis MD    Pilar Jarvis, MD 12/25/22 [phone]

## 2022-12-26 ENCOUNTER — Inpatient Hospital Stay: Payer: PPO

## 2022-12-26 ENCOUNTER — Inpatient Hospital Stay: Payer: PPO | Admitting: Oncology

## 2022-12-26 ENCOUNTER — Ambulatory Visit: Payer: PPO | Admitting: Oncology

## 2022-12-26 ENCOUNTER — Other Ambulatory Visit: Payer: PPO

## 2022-12-26 ENCOUNTER — Ambulatory Visit: Payer: PPO

## 2022-12-26 DIAGNOSIS — S02831S Fracture of medial orbital wall, right side, sequela: Secondary | ICD-10-CM | POA: Diagnosis not present

## 2022-12-26 DIAGNOSIS — R2681 Unsteadiness on feet: Secondary | ICD-10-CM | POA: Diagnosis not present

## 2022-12-26 DIAGNOSIS — W01190A Fall on same level from slipping, tripping and stumbling with subsequent striking against furniture, initial encounter: Secondary | ICD-10-CM | POA: Diagnosis not present

## 2022-12-26 DIAGNOSIS — S0231XA Fracture of orbital floor, right side, initial encounter for closed fracture: Secondary | ICD-10-CM | POA: Diagnosis not present

## 2022-12-26 DIAGNOSIS — D649 Anemia, unspecified: Secondary | ICD-10-CM | POA: Diagnosis not present

## 2022-12-26 DIAGNOSIS — M6259 Muscle wasting and atrophy, not elsewhere classified, multiple sites: Secondary | ICD-10-CM | POA: Diagnosis not present

## 2022-12-26 DIAGNOSIS — I358 Other nonrheumatic aortic valve disorders: Secondary | ICD-10-CM | POA: Diagnosis not present

## 2022-12-26 DIAGNOSIS — Y92009 Unspecified place in unspecified non-institutional (private) residence as the place of occurrence of the external cause: Secondary | ICD-10-CM | POA: Diagnosis not present

## 2022-12-26 DIAGNOSIS — R41841 Cognitive communication deficit: Secondary | ICD-10-CM | POA: Diagnosis not present

## 2022-12-26 DIAGNOSIS — M79651 Pain in right thigh: Secondary | ICD-10-CM | POA: Diagnosis not present

## 2022-12-26 DIAGNOSIS — C541 Malignant neoplasm of endometrium: Secondary | ICD-10-CM | POA: Diagnosis not present

## 2022-12-26 DIAGNOSIS — Z736 Limitation of activities due to disability: Secondary | ICD-10-CM | POA: Diagnosis not present

## 2022-12-26 DIAGNOSIS — H532 Diplopia: Secondary | ICD-10-CM | POA: Diagnosis not present

## 2022-12-26 DIAGNOSIS — R296 Repeated falls: Secondary | ICD-10-CM | POA: Diagnosis not present

## 2022-12-26 DIAGNOSIS — I10 Essential (primary) hypertension: Secondary | ICD-10-CM | POA: Diagnosis not present

## 2022-12-26 DIAGNOSIS — S91302A Unspecified open wound, left foot, initial encounter: Secondary | ICD-10-CM | POA: Diagnosis not present

## 2022-12-26 DIAGNOSIS — E119 Type 2 diabetes mellitus without complications: Secondary | ICD-10-CM | POA: Diagnosis not present

## 2022-12-26 LAB — CBG MONITORING, ED
Glucose-Capillary: 233 mg/dL — ABNORMAL HIGH (ref 70–99)
Glucose-Capillary: 312 mg/dL — ABNORMAL HIGH (ref 70–99)

## 2022-12-26 MED ORDER — OXYCODONE HCL 5 MG PO TABS: 5.0000 mg | ORAL_TABLET | Freq: Three times a day (TID) | ORAL | 0 refills | Status: DC | PRN

## 2022-12-26 NOTE — ED Provider Notes (Signed)
Emergency Medicine Observation Re-evaluation Note  Physical Exam   BP 97/79 (BP Location: Right Arm)   Pulse 66   Temp 98 F (36.7 C) (Oral)   Resp 19   Ht 5\' 1"  (1.549 m)   Wt 87.5 kg   SpO2 94%   BMI 36.45 kg/m   Patient appears in no acute distress.  ED Course / MDM   No reported events during my shift at the time of this note.   Pt is awaiting dispo from SW   Pilar Jarvis MD    Pilar Jarvis, MD 12/26/22 [phone]

## 2022-12-26 NOTE — Inpatient Diabetes Management (Signed)
Inpatient Diabetes Program Recommendations  AACE/ADA: New Consensus Statement on Inpatient Glycemic Control (2015)  Target Ranges:  Prepandial:   less than 140 mg/dL      [facility] postprandial:   less than 180 mg/dL (1-2 hours)      Critically ill patients:  140 - 180 mg/dL   Lab Results  Component Value Date   GLUCAP 233 (H) 12/26/2022   HGBA1C 7.2 (H) 12/25/2022    Review of Glycemic Control  Latest Reference Range & Units 12/24/22 16:03 12/25/22 12:17 12/25/22 16:54 12/26/22 08:59  Glucose-Capillary 70 - 99 mg/dL 295 (H) 284 (H) 132 (H) 233 (H)   Diabetes history: DM 2 Outpatient Diabetes medications:  Glucotrol XL 10 mg daily Novolog 7 units tid with meals Levemir 25 units bid Current orders for Inpatient glycemic control:  Novolog moderate tid with meals and HS Levemir 25 units q HS Glucotrol XL 10 mg daily  Inpatient Diabetes Program Recommendations:    May consider changing Levemir to 20 units bid.  Also consider adding Novolog meal coverage 3 units tid with meals (hold if patient eats less than 50% or NPO).   Thanks,  Beryl Meager, RN, BC-ADM Inpatient Diabetes Coordinator Pager [phone]  (8a-5p)

## 2022-12-26 NOTE — ED Notes (Signed)
MD aware of pt's BP upon discharge. Pt stable at time of departure

## 2022-12-26 NOTE — ED Notes (Signed)
[facility]  CALLED  FOR  TRANSPORT  TO  [facility] 

## 2022-12-26 NOTE — ED Notes (Signed)
PT at bedside.

## 2022-12-26 NOTE — ED Notes (Signed)
Pt taken the restroom via wheelchair.

## 2022-12-26 NOTE — ED Notes (Signed)
Pt assisted into restroom to brush teeth and clean up

## 2022-12-26 NOTE — TOC Progression Note (Signed)
Transition of Care [facility]) - Progression Note    Patient Details  Name: Terri [name] MRN: [id number] Date of Birth: 01-17-1951  Transition of Care [facility]) CM/SW Contact  Kreg Shropshire, RN Phone Number: 12/26/2022, 9:05 AM  Clinical Narrative:     Cm called [employer] Authorization department. Rep stated that they are still working on authorization and they will give me a call when they have an update.     Barriers to Discharge: Continued Medical Work up  Expected Discharge Plan and Services     Post Acute Care Choice: Home Health, Skilled Nursing Facility Living arrangements for the past 2 months: Single Family Home                                       Social Determinants of Health (SDOH) Interventions SDOH Screenings   Food Insecurity: No Food Insecurity (12/20/2022)   Received from [facility]  Housing: Low Risk  (05/30/2022)  Transportation Needs: Unknown (12/20/2022)   Received from [facility]  Utilities: Not At Risk (12/20/2022)   Received from [facility]  Alcohol Screen: Low Risk  (09/20/2022)  Depression (PHQ2-9): Low Risk  (05/30/2022)  Financial Resource Strain: Low Risk  (12/20/2022)   Received from [facility]  Physical Activity: Inactive (09/20/2022)  Social Connections: Socially Isolated (09/20/2022)  Stress: Stress Concern Present (09/20/2022)  Tobacco Use: Medium Risk (12/24/2022)    Readmission Risk Interventions     No data to display

## 2022-12-26 NOTE — Progress Notes (Signed)
Occupational Therapy Evaluation Patient Details Name: Terri [name] MRN: [id number] DOB: [birth year] Today's Date: 12/26/2022   History of Present Illness Terri [name] is a 71yoF who comes to [facility] ED on 8/26 after tripping over cat, fall sustained, unable to stand 2/2 Rt thigh pain. Rt hip CT negative for fracture/dislocation, questionable for soft tissue injury to adductor musculature. Pt previously here in ED (from [facility]) on 8/21 after fall at home and face impact on table, cat involvement, CT scan show orbital floor fracture with herniation of the inferior rectus muscle, transferred to [facility] ED 8/21.   Clinical Impression   Pt was seen for OT evaluation this date. Prior to hospital admission, pt was IND - MOD I for ADLs and ambulating without AD. 3x falls in the last six months. Pt lives alone and has friends nearby to assist. Pt presents to acute OT demonstrating impaired ADL performance and functional mobility 2/2 impaired strength and balance (See OT problem list for additional functional deficits). Pt completes functional mobility with RW, t/f bathroom with minA for cuing TTWB on RLE. Dons socks in figure four while seated. Pt mildly impulsive at times, but no increase in pain or discomfort in area of injury throughout. Pt would benefit from skilled OT services to address noted impairments and functional limitations (see below for any additional details) in order to maximize safety and independence while minimizing falls risk and caregiver burden. Anticipate the need for follow up OT services upon acute hospital DC.        If plan is discharge home, recommend the following: A little help with walking and/or transfers;A little help with bathing/dressing/bathroom;Assistance with cooking/housework;Direct supervision/assist for financial management;Direct supervision/assist for medications management;Assist for transportation;Help with stairs or ramp for entrance    Functional Status Assessment   Patient has had a recent decline in their functional status and demonstrates the ability to make significant improvements in function in a reasonable and predictable amount of time.  Equipment Recommendations  BSC/3in1    Recommendations for Other Services       Precautions / Restrictions Precautions Precautions: Fall      Mobility Bed Mobility Overal bed mobility: Modified Independent             General bed mobility comments: sitting on edge of gurney both legs dangling upon arrival    Transfers Overall transfer level: Needs assistance Equipment used: Rolling walker (2 wheels) Transfers: Sit to/from Stand Sit to Stand: Supervision, From elevated surface           General transfer comment: pt stands impulsively, ignoring OT's cues and tolerates static standing with no reports of pain using RW. OT immediately redirects to return to seated position      Balance Overall balance assessment: Needs assistance Sitting-balance support: Bilateral upper extremity supported       Standing balance support: Bilateral upper extremity supported, During functional activity, Reliant on assistive device for balance Standing balance-Leahy Scale: Fair                             ADL either performed or assessed with clinical judgement   ADL Overall ADL's : Needs assistance/impaired                         Toilet Transfer: Minimal assistance;Cueing for sequencing;Cueing for safety;Ambulation;Rolling walker (2 wheels)   Toileting- Clothing Manipulation and Hygiene: Contact guard assist;Cueing for sequencing;Cueing for safety;Sit to/from stand  Functional mobility during ADLs: Rolling walker (2 wheels) General ADL Comments: Pt completes functional mobility t/f bathroom using RW, TTWB RLE, completes void and clothing mgmt with CGA-minA.     Vision Baseline Vision/History: 1 Wears glasses              Pertinent Vitals/Pain Pain Assessment Pain  Assessment: No/denies pain     Extremity/Trunk Assessment Upper Extremity Assessment Upper Extremity Assessment: Overall WFL for tasks assessed (4/5 MMT)   Lower Extremity Assessment Lower Extremity Assessment: RLE deficits/detail (R hamstring rupture)       Communication Communication Communication: No apparent difficulties   Cognition Arousal: Alert Behavior During Therapy: WFL for tasks assessed/performed Overall Cognitive Status: Within Functional Limits for tasks assessed                                 General Comments: hyperverbal requiring redirection at times     General Comments               Home Living Family/patient expects to be discharged to:: Private residence Living Arrangements: Alone Available Help at Discharge: Friend(s);Neighbor Type of Home: House Home Access: Stairs to enter [employer] of Steps: 3 Entrance Stairs-Rails: None Home Layout: Two level;Able to live on main level with bedroom/bathroom         Bathroom Toilet: Standard     Home Equipment: None   Additional Comments: pt states that she can live on 1st level, bathroom on 2nd level is broken      Prior Functioning/Environment Prior Level of Function : Independent/Modified Independent             Mobility Comments: typically limitied community AMB without device ADLs Comments: modI to I        OT Problem List: Decreased strength;Decreased range of motion;Decreased activity tolerance;Impaired balance (sitting and/or standing);Decreased safety awareness;Decreased knowledge of precautions;Decreased knowledge of use of DME or AE      OT Treatment/Interventions:   OT Goals(Current goals can be found in the care plan section) Acute Rehab OT Goals Patient Stated Goal: to go home OT Goal Formulation: All assessment and education complete, DC therapy  OT Frequency:         AM-PAC OT "6 Clicks" Daily Activity     Outcome Measure Help from another  person eating meals?: None Help from another person taking care of personal grooming?: None Help from another person toileting, which includes using toliet, bedpan, or urinal?: A Little Help from another person bathing (including washing, rinsing, drying)?: A Little Help from another person to put on and taking off regular upper body clothing?: A Little Help from another person to put on and taking off regular lower body clothing?: A Little 6 Click Score: 20   End of Session Equipment Utilized During Treatment: Gait belt;Rolling walker (2 wheels)  Activity Tolerance: Patient tolerated treatment well Patient left: with call bell/phone within reach;in bed  OT Visit Diagnosis: Repeated falls (R29.6);Muscle weakness (generalized) (M62.81)                Time: 0630-1601 OT Time Calculation (min): 42 min Charges:  OT General Charges $OT Visit: 1 Visit OT Evaluation $OT Eval Low Complexity: 1 Low OT Treatments $Self Care/Home Management : 8-22 mins  Bethel Gaglio L. Kacy Hegna, OTR/L  12/26/22, 4:26 PM

## 2022-12-26 NOTE — ED Provider Notes (Signed)
  Physical Exam  BP (!) 185/85 (BP Location: Right Arm)   Pulse 86   Temp 98.5 F (36.9 C) (Oral)   Resp 20   Ht 5\' 1"  (1.549 m)   Wt 87.5 kg   SpO2 96%   BMI 36.45 kg/m    Procedures  Procedures  ED Course / MDM    Medical Decision Making Amount and/or Complexity of Data Reviewed Radiology: ordered.  Risk OTC drugs. Prescription drug management.   Patient was a 72 year old female presenting for ground-level fall.  CT imaging was negative for any acute injuries.  Laboratory workup otherwise stable.  Patient was unable to ambulate and physical therapy and social work were consulted.  Patient was ultimately deemed needing further rehabilitation.  Patient has been approved for skilled nursing facility at [facility].  She will be discharged today with ongoing therapy.  Claudell Kyle, MD       Janith Lima, MD 12/26/22 [phone]

## 2022-12-26 NOTE — Discharge Instructions (Signed)
Patient was seen in the emergency department for a ground-level fall.  Imaging was performed which was thankfully all unremarkable for any acute injuries.  She was evaluated by physical therapy who recommends placement for assistance with ongoing rehabilitation.  Patient was otherwise medically stable and her time in the emergency department.

## 2022-12-26 NOTE — ED Notes (Signed)
Pt Wellbutrin dropped on the floor, pulled an extra pill to give pt

## 2022-12-26 NOTE — TOC Progression Note (Signed)
Transition of Care [facility]) - Progression Note    Patient Details  Name: Terri [name] MRN: [id number] Date of Birth: 08-13-1950  [facility] CM/SW Contact  Kreg Shropshire, RN Phone Number: 12/26/2022, 12:11 PM  Clinical Narrative:     Junious Dresser of [employer] stated pt has been approved to go to SNF at [facility] [id number]  and by ambulance auth [id number]. Cm received call from Tammy with admissions at [facility]. Pt is going to room 708 and have nurse call report to [phone]. Cm notified nursing team.    Barriers to Discharge: Continued Medical Work up  Expected Discharge Plan and Services     Post Acute Care Choice: Home Health, Skilled Nursing Facility Living arrangements for the past 2 months: Single Family Home                                       Social Determinants of Health (SDOH) Interventions SDOH Screenings   Food Insecurity: No Food Insecurity (12/20/2022)   Received from [facility]  Housing: Low Risk  (05/30/2022)  Transportation Needs: Unknown (12/20/2022)   Received from [facility]  Utilities: Not At Risk (12/20/2022)   Received from [facility]  Alcohol Screen: Low Risk  (09/20/2022)  Depression (PHQ2-9): Low Risk  (05/30/2022)  Financial Resource Strain: Low Risk  (12/20/2022)   Received from [facility]  Physical Activity: Inactive (09/20/2022)  Social Connections: Socially Isolated (09/20/2022)  Stress: Stress Concern Present (09/20/2022)  Tobacco Use: Medium Risk (12/24/2022)    Readmission Risk Interventions     No data to display

## 2022-12-27 DIAGNOSIS — R296 Repeated falls: Secondary | ICD-10-CM | POA: Diagnosis not present

## 2023-01-01 DIAGNOSIS — R296 Repeated falls: Secondary | ICD-10-CM | POA: Diagnosis not present

## 2023-01-01 DIAGNOSIS — S91302A Unspecified open wound, left foot, initial encounter: Secondary | ICD-10-CM | POA: Diagnosis not present

## 2023-01-01 DIAGNOSIS — E119 Type 2 diabetes mellitus without complications: Secondary | ICD-10-CM | POA: Diagnosis not present

## 2023-01-03 ENCOUNTER — Other Ambulatory Visit: Payer: Self-pay

## 2023-01-03 DIAGNOSIS — S0231XA Fracture of orbital floor, right side, initial encounter for closed fracture: Secondary | ICD-10-CM | POA: Diagnosis not present

## 2023-01-03 DIAGNOSIS — W01190A Fall on same level from slipping, tripping and stumbling with subsequent striking against furniture, initial encounter: Secondary | ICD-10-CM | POA: Diagnosis not present

## 2023-01-03 DIAGNOSIS — H532 Diplopia: Secondary | ICD-10-CM | POA: Diagnosis not present

## 2023-01-03 DIAGNOSIS — Y92009 Unspecified place in unspecified non-institutional (private) residence as the place of occurrence of the external cause: Secondary | ICD-10-CM | POA: Diagnosis not present

## 2023-01-04 ENCOUNTER — Other Ambulatory Visit: Payer: Self-pay

## 2023-01-04 DIAGNOSIS — E119 Type 2 diabetes mellitus without complications: Secondary | ICD-10-CM | POA: Diagnosis not present

## 2023-01-04 DIAGNOSIS — R296 Repeated falls: Secondary | ICD-10-CM | POA: Diagnosis not present

## 2023-01-07 ENCOUNTER — Other Ambulatory Visit: Payer: Self-pay

## 2023-01-08 ENCOUNTER — Institutional Professional Consult (permissible substitution): Payer: PPO | Admitting: Student in an Organized Health Care Education/Training Program

## 2023-01-09 ENCOUNTER — Ambulatory Visit: Payer: PPO

## 2023-01-10 ENCOUNTER — Other Ambulatory Visit (INDEPENDENT_AMBULATORY_CARE_PROVIDER_SITE_OTHER): Payer: Self-pay | Admitting: Nurse Practitioner

## 2023-01-10 DIAGNOSIS — M79605 Pain in left leg: Secondary | ICD-10-CM

## 2023-01-14 ENCOUNTER — Other Ambulatory Visit: Payer: PPO

## 2023-01-14 DIAGNOSIS — E669 Obesity, unspecified: Secondary | ICD-10-CM | POA: Diagnosis not present

## 2023-01-14 DIAGNOSIS — Z86010 Personal history of colonic polyps: Secondary | ICD-10-CM | POA: Diagnosis not present

## 2023-01-14 DIAGNOSIS — K5730 Diverticulosis of large intestine without perforation or abscess without bleeding: Secondary | ICD-10-CM | POA: Diagnosis not present

## 2023-01-14 DIAGNOSIS — E782 Mixed hyperlipidemia: Secondary | ICD-10-CM | POA: Diagnosis not present

## 2023-01-14 DIAGNOSIS — I10 Essential (primary) hypertension: Secondary | ICD-10-CM | POA: Diagnosis not present

## 2023-01-14 DIAGNOSIS — K219 Gastro-esophageal reflux disease without esophagitis: Secondary | ICD-10-CM | POA: Diagnosis not present

## 2023-01-14 DIAGNOSIS — R001 Bradycardia, unspecified: Secondary | ICD-10-CM | POA: Diagnosis not present

## 2023-01-14 DIAGNOSIS — Z23 Encounter for immunization: Secondary | ICD-10-CM | POA: Diagnosis not present

## 2023-01-14 DIAGNOSIS — I350 Nonrheumatic aortic (valve) stenosis: Secondary | ICD-10-CM | POA: Diagnosis not present

## 2023-01-15 ENCOUNTER — Ambulatory Visit
Admission: RE | Admit: 2023-01-15 | Discharge: 2023-01-15 | Disposition: A | Discharge status: Home / Self Care | Payer: PPO | Source: Ambulatory Visit | Attending: Nurse Practitioner | Admitting: Nurse Practitioner

## 2023-01-15 ENCOUNTER — Encounter: Payer: Self-pay | Admitting: Oncology

## 2023-01-15 ENCOUNTER — Other Ambulatory Visit: Payer: Self-pay

## 2023-01-15 ENCOUNTER — Inpatient Hospital Stay: Payer: PPO | Attending: Obstetrics and Gynecology

## 2023-01-15 DIAGNOSIS — S76819A Strain of other specified muscles, fascia and tendons at thigh level, unspecified thigh, initial encounter: Secondary | ICD-10-CM | POA: Insufficient documentation

## 2023-01-15 DIAGNOSIS — K449 Diaphragmatic hernia without obstruction or gangrene: Secondary | ICD-10-CM | POA: Diagnosis not present

## 2023-01-15 DIAGNOSIS — E1151 Type 2 diabetes mellitus with diabetic peripheral angiopathy without gangrene: Secondary | ICD-10-CM | POA: Insufficient documentation

## 2023-01-15 DIAGNOSIS — I350 Nonrheumatic aortic (valve) stenosis: Secondary | ICD-10-CM | POA: Insufficient documentation

## 2023-01-15 DIAGNOSIS — N939 Abnormal uterine and vaginal bleeding, unspecified: Secondary | ICD-10-CM | POA: Insufficient documentation

## 2023-01-15 DIAGNOSIS — J4489 Other specified chronic obstructive pulmonary disease: Secondary | ICD-10-CM | POA: Insufficient documentation

## 2023-01-15 DIAGNOSIS — D649 Anemia, unspecified: Secondary | ICD-10-CM | POA: Insufficient documentation

## 2023-01-15 DIAGNOSIS — I10 Essential (primary) hypertension: Secondary | ICD-10-CM | POA: Insufficient documentation

## 2023-01-15 DIAGNOSIS — C7982 Secondary malignant neoplasm of genital organs: Secondary | ICD-10-CM | POA: Insufficient documentation

## 2023-01-15 DIAGNOSIS — I2510 Atherosclerotic heart disease of native coronary artery without angina pectoris: Secondary | ICD-10-CM | POA: Diagnosis not present

## 2023-01-15 DIAGNOSIS — Z6836 Body mass index (BMI) 36.0-36.9, adult: Secondary | ICD-10-CM | POA: Insufficient documentation

## 2023-01-15 DIAGNOSIS — C541 Malignant neoplasm of endometrium: Secondary | ICD-10-CM | POA: Insufficient documentation

## 2023-01-15 DIAGNOSIS — Z9221 Personal history of antineoplastic chemotherapy: Secondary | ICD-10-CM | POA: Insufficient documentation

## 2023-01-15 DIAGNOSIS — Z5112 Encounter for antineoplastic immunotherapy: Secondary | ICD-10-CM | POA: Insufficient documentation

## 2023-01-15 DIAGNOSIS — S2241XD Multiple fractures of ribs, right side, subsequent encounter for fracture with routine healing: Secondary | ICD-10-CM | POA: Diagnosis not present

## 2023-01-15 DIAGNOSIS — D696 Thrombocytopenia, unspecified: Secondary | ICD-10-CM | POA: Insufficient documentation

## 2023-01-15 DIAGNOSIS — Z923 Personal history of irradiation: Secondary | ICD-10-CM | POA: Insufficient documentation

## 2023-01-15 LAB — GLUCOSE, CAPILLARY: Glucose-Capillary: 175 mg/dL — ABNORMAL HIGH (ref 70–99)

## 2023-01-15 MED ORDER — FLUDEOXYGLUCOSE F - 18 (FDG) INJECTION
9.9000 | Freq: Once | INTRAVENOUS | Status: AC | PRN
  Administered 2023-01-15: 10.17 via INTRAVENOUS

## 2023-01-16 ENCOUNTER — Ambulatory Visit: Payer: PPO

## 2023-01-16 ENCOUNTER — Encounter: Payer: Self-pay | Admitting: Oncology

## 2023-01-16 ENCOUNTER — Inpatient Hospital Stay (HOSPITAL_BASED_OUTPATIENT_CLINIC_OR_DEPARTMENT_OTHER): Payer: PPO | Admitting: Oncology

## 2023-01-16 ENCOUNTER — Inpatient Hospital Stay: Payer: PPO | Admitting: Obstetrics and Gynecology

## 2023-01-16 ENCOUNTER — Other Ambulatory Visit: Payer: PPO

## 2023-01-16 ENCOUNTER — Inpatient Hospital Stay: Payer: PPO

## 2023-01-16 ENCOUNTER — Other Ambulatory Visit: Payer: Self-pay | Admitting: Oncology

## 2023-01-16 VITALS — BP 151/50 | HR 53

## 2023-01-16 VITALS — BP 180/62 | HR 56 | Temp 96.4°F | Resp 16 | Wt 193.0 lb

## 2023-01-16 VITALS — BP 180/62 | HR 56 | Temp 96.4°F | Resp 16 | Ht 61.0 in | Wt 193.0 lb

## 2023-01-16 DIAGNOSIS — Z9221 Personal history of antineoplastic chemotherapy: Secondary | ICD-10-CM | POA: Diagnosis not present

## 2023-01-16 DIAGNOSIS — N939 Abnormal uterine and vaginal bleeding, unspecified: Secondary | ICD-10-CM | POA: Diagnosis not present

## 2023-01-16 DIAGNOSIS — C541 Malignant neoplasm of endometrium: Secondary | ICD-10-CM

## 2023-01-16 DIAGNOSIS — S76819A Strain of other specified muscles, fascia and tendons at thigh level, unspecified thigh, initial encounter: Secondary | ICD-10-CM | POA: Diagnosis not present

## 2023-01-16 DIAGNOSIS — Z6836 Body mass index (BMI) 36.0-36.9, adult: Secondary | ICD-10-CM | POA: Diagnosis not present

## 2023-01-16 DIAGNOSIS — Z5112 Encounter for antineoplastic immunotherapy: Secondary | ICD-10-CM | POA: Diagnosis not present

## 2023-01-16 DIAGNOSIS — I10 Essential (primary) hypertension: Secondary | ICD-10-CM | POA: Diagnosis not present

## 2023-01-16 DIAGNOSIS — E1151 Type 2 diabetes mellitus with diabetic peripheral angiopathy without gangrene: Secondary | ICD-10-CM | POA: Diagnosis not present

## 2023-01-16 DIAGNOSIS — I350 Nonrheumatic aortic (valve) stenosis: Secondary | ICD-10-CM | POA: Diagnosis not present

## 2023-01-16 DIAGNOSIS — D649 Anemia, unspecified: Secondary | ICD-10-CM | POA: Diagnosis not present

## 2023-01-16 DIAGNOSIS — Z923 Personal history of irradiation: Secondary | ICD-10-CM | POA: Diagnosis not present

## 2023-01-16 DIAGNOSIS — D696 Thrombocytopenia, unspecified: Secondary | ICD-10-CM | POA: Diagnosis not present

## 2023-01-16 DIAGNOSIS — J4489 Other specified chronic obstructive pulmonary disease: Secondary | ICD-10-CM | POA: Diagnosis not present

## 2023-01-16 DIAGNOSIS — C7982 Secondary malignant neoplasm of genital organs: Secondary | ICD-10-CM | POA: Diagnosis not present

## 2023-01-16 LAB — CMP (CANCER CENTER ONLY)
ALT: 12 U/L (ref 0–44)
AST: 14 U/L — ABNORMAL LOW (ref 15–41)
Albumin: 3.2 g/dL — ABNORMAL LOW (ref 3.5–5.0)
Alkaline Phosphatase: 75 U/L (ref 38–126)
Anion gap: 5 (ref 5–15)
BUN: 20 mg/dL (ref 8–23)
CO2: 30 mmol/L (ref 22–32)
Calcium: 8.4 mg/dL — ABNORMAL LOW (ref 8.9–10.3)
Chloride: 100 mmol/L (ref 98–111)
Creatinine: 0.96 mg/dL (ref 0.44–1.00)
GFR, Estimated: >60 mL/min (ref >60)
Glucose, Bld: 260 mg/dL — ABNORMAL HIGH (ref 70–99)
Potassium: 3.9 mmol/L (ref 3.5–5.1)
Sodium: 135 mmol/L (ref 135–145)
Total Bilirubin: 0.5 mg/dL (ref 0.3–1.2)
Total Protein: 5.9 g/dL — ABNORMAL LOW (ref 6.5–8.1)

## 2023-01-16 LAB — CBC WITH DIFFERENTIAL (CANCER CENTER ONLY)
Abs Immature Granulocytes: 0.02 10*3/uL (ref 0.00–0.07)
Basophils Absolute: 0 10*3/uL (ref 0.0–0.1)
Basophils Relative: 0 %
Eosinophils Absolute: 0.1 10*3/uL (ref 0.0–0.5)
Eosinophils Relative: 3 %
HCT: 31.9 % — ABNORMAL LOW (ref 36.0–46.0)
Hemoglobin: 10.3 g/dL — ABNORMAL LOW (ref 12.0–15.0)
Immature Granulocytes: 1 %
Lymphocytes Relative: 5 %
Lymphs Abs: 0.2 10*3/uL — ABNORMAL LOW (ref 0.7–4.0)
MCH: 27.9 pg (ref 26.0–34.0)
MCHC: 32.3 g/dL (ref 30.0–36.0)
MCV: 86.4 fL (ref 80.0–100.0)
Monocytes Absolute: 0.4 10*3/uL (ref 0.1–1.0)
Monocytes Relative: 10 %
Neutro Abs: 3.5 10*3/uL (ref 1.7–7.7)
Neutrophils Relative %: 81 %
Platelet Count: 121 10*3/uL — ABNORMAL LOW (ref 150–400)
RBC: 3.69 MIL/uL — ABNORMAL LOW (ref 3.87–5.11)
RDW: 14.1 % (ref 11.5–15.5)
WBC Count: 4.3 10*3/uL (ref 4.0–10.5)
nRBC: 0 % (ref 0.0–0.2)

## 2023-01-16 LAB — TSH: TSH: 1.714 u[IU]/mL (ref 0.350–4.500)

## 2023-01-16 MED ORDER — SODIUM CHLORIDE 0.9 % IV SOLN
200.0000 mg | Freq: Once | INTRAVENOUS | Status: AC
  Administered 2023-01-16: 200 mg via INTRAVENOUS
  Filled 2023-01-16: qty 200

## 2023-01-16 MED ORDER — SODIUM CHLORIDE 0.9 % IV SOLN
Freq: Once | INTRAVENOUS | Status: AC
  Filled 2023-01-16: qty 250

## 2023-01-16 MED ORDER — SODIUM CHLORIDE 0.9% FLUSH
10.0000 mL | INTRAVENOUS | Status: DC | PRN
  Administered 2023-01-16: 10 mL
  Filled 2023-01-16: qty 10

## 2023-01-16 MED ORDER — HEPARIN SOD (PORK) LOCK FLUSH 100 UNIT/ML IV SOLN
500.0000 [IU] | Freq: Once | INTRAVENOUS | Status: AC | PRN
  Administered 2023-01-16: 500 [IU]
  Filled 2023-01-16: qty 5

## 2023-01-16 NOTE — Progress Notes (Signed)
Gynecologic Oncology Consult Visit   Referring Provider: Dr. 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 female who is seen in consultation from Dr. 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   In interim, she had fall at home and suffered complete rupture of hamstring. She was discharged to rehab but has since checked herself out. Is considering evaluation with orthopedic surgery but is currently ambulating w/o aids. She says she has new vaginal bleeding/spotting, described as light, for past month.   PET scan done yesterday and pending.    Treatment Summary:  03/13/22- 04/18/22- pelvic radiation for extensive vaginal involvement of malignancy 05/15/22- CT C/A/P consistent with residual endometrial primary, borderline common iliac nodes similar to pet and not hypermetabolic. Scattered pulmonary nodules.  06/20/22- Cycle 1 Carbo-Taxol-Keytruda 07/11/22- Cycle 2 Carbo-taxol-keytruda 08/01/22- Cycle 3 Carbo-taxol-keytruda 08/14/22- PET - consistent with response to therapy 08/22/22- Cycle 4 Carbo-taxol-keytruda 09/12/22- Cycle 5 carbo-taxol-keytruda 10/03/22- Cycle 6 carbo-taxol-keytruda 10/12/22- CT C/A/P - unchanged appearance. No worsening disease. Radiation effects.  10/23/22- maintenance Rande Lawman  11/14/22- Rande Lawman 12/05/22- Rande Lawman 8/28/24Rande Lawman  Gyn Oncology History She was referred by Dr. Judithann Sheen to Dr. Jean Rosenthal for reports of postmenopausal bleeding for 6 months, may be more.  Scant.  Became heavier. Additionally has urinary incontinence.    On exam, friable cervical vs vaginal mass was partially visualized though exam was limited. Biopsy was performed.   Cervical Biopsy: poorly differentiated carcinoma, favor endometrial origin. Positive for p53 and ER. Focal staining for p16, negative  for napsin and p63. HER2- 1+ negative. MSI/MMR- Stable  Colonoscopy was scheduled but prep inadequate.    PET/CT EXAM: 10/23 Marked diffuse hypermetabolic activity throughout the entire uterus and cervix, consistent with malignancy. This favors endometrial carcinoma over cervical carcinoma. Borderline enlarged bilateral common iliac lymph nodes noted, but without FDG uptake. No definite evidence of metastatic disease by PET.  Patient seen 01/24/22 and found to have extensive cervical and upper vaginal involvement.  Only having light bleeding.  Decision made to treat her with primary radiation therapy because of extensive involvement of cervix and adjacent vagina, and she was reluctant to consider chemotherapy.  She received primary pelvic external radiation 50 Gy, completed 04/18/22.   05/15/22- CT Chest abdomen Pelvis w contrast IMPRESSION: 1. Central uterine hypoattenuation likely represents residual endometrial primary. 2. Borderline sized common iliac nodes are similar to the prior PET and were not hypermetabolic on that exam. Favored to be reactive. Otherwise, no evidence of metastatic disease in the abdomen or pelvis. 3. Scattered tiny pulmonary nodules are subpleural predominant and favored to represent subpleural lymph nodes. These can be re-evaluated at follow-up. 4.  Tiny hiatal hernia. 5. Interval nonacute anterior left third and fourth rib fractures. 6. Aortic atherosclerosis (ICD10-I70.0) and emphysema (ICD10-J43.9).  Treated with Carboplatin/Taxol and Keytruda x 3 cycles with excellent clinical response clinically.   CT/PET 4/24 shows response in the uterus.  See below. IMPRESSION: 1. Interval decrease in radiotracer uptake associated with the uterus compatible with response to therapy. SUV max is currently equal to 4.22, compared with 21.8 previously. 2. No significant change in size of bilateral common iliac lymph nodes with mild, nonspecific FDG uptake. 3. No new sites of  disease identified. 4. Diffusely increased bone marrow activity is new compared with the previous exam and likely reflects treatment related  changes.   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   Hyperlipemia, mixed 04/05/2015   HTN, goal below 140/80 04/05/2015   Obesity (BMI 30-39.9) 04/05/2015   Type 2 diabetes mellitus with complication, with long-term current use of insulin (HCC) 04/05/2015   Class 2 severe obesity due to excess calories with serious comorbidity in adult (HCC) 08/19/2013   Obesity, Class II, BMI 35-39.9, with comorbidity 08/19/2013   Chronic obstructive pulmonary disease, unspecified (HCC) 07/31/2012   Claudication (HCC) 03/12/2012   Tobacco use 03/12/2012   Hyperlipidemia associated with type 2 diabetes mellitus (HCC) 06/06/2011   Hypertension associated with diabetes (HCC) 06/06/2011   Major depression 06/06/2011   Type 2 diabetes mellitus without complication (HCC) 06/06/2011    Past Medical History: Past Medical History:  Diagnosis Date   Adenomatous colon polyp    Aortic stenosis    Arthritis    Asthma    Claudication (HCC)    COPD (chronic obstructive pulmonary disease) (HCC)    Depression    Diabetes mellitus without complication (HCC)    Esophageal dysphagia    GERD (gastroesophageal reflux disease)    Hyperlipemia    Hypertension    Obesity    Severe obesity (BMI 35.0-35.9 with comorbidity) (HCC)     Past Surgical History: Past Surgical History:  Procedure Laterality Date   BLADDER SURGERY     COLONOSCOPY WITH PROPOFOL N/A 08/05/2015   Procedure: COLONOSCOPY WITH PROPOFOL;  Surgeon: Elnita Maxwell, MD;  Location: [facility]  ENDOSCOPY;  Service: Endoscopy;  Laterality: N/A;   ESOPHAGOGASTRODUODENOSCOPY N/A 01/09/2022   Procedure: ESOPHAGOGASTRODUODENOSCOPY (EGD);  Surgeon: Regis Bill, MD;  Location: [facility] ENDOSCOPY;  Service: Endoscopy;  Laterality: N/A;   FOOT SURGERY Left    FRACTURE SURGERY     IR CV LINE INJECTION  06/22/2022   IR IMAGING GUIDED PORT INSERTION  06/05/2022   IR PORT REPAIR CENTRAL VENOUS ACCESS DEVICE  07/02/2022   ORIF ANKLE FRACTURE Left 03/26/2017   Procedure: OPEN REDUCTION INTERNAL FIXATION (ORIF) ANKLE FRACTURE;  Surgeon: Christena Flake, MD;  Location: [facility] ORS;  Service: Orthopedics;  Laterality: Left;    OB History:  G17P0 OB History  No obstetric history on file.    Family History: Family History  Problem Relation Age of Onset   CVA Mother    Diabetes Mother    CAD Father    Breast cancer Neg Hx     Social History: Social History   Socioeconomic History   Marital status: Single    Spouse name: Not on file   Number of children: Not on file   Years of education: Not on file   Highest education level: Not on file  Occupational History   Not on file  Tobacco Use   Smoking status: Former    Current packs/day: 1.00    Average packs/day: 1 pack/day for 40.0 years (40.0 ttl pk-yrs)    Types: Cigarettes   Smokeless tobacco: Never  Vaping Use   Vaping status: Never Used  Substance and Sexual Activity   Alcohol use: No   Drug use: No   Sexual activity: Not on file  Other Topics Concern   Not on file  Social History Narrative   Not on file   Social Determinants of Health   Financial Resource Strain: Low Risk  (12/20/2022)   Received [facility]   Overall Financial Resource Strain (CARDIA)    Difficulty of Paying Living Expenses: Not very hard  Food Insecurity: No Food Insecurity (12/20/2022)   Received from [facility]   Hunger Vital Sign    Worried About Running Out of Food in the Last Year: Never true    Ran Out of  Food in the Last Year: Never true  Transportation Needs: Unknown (12/20/2022)   Received from [facility] - Transportation    In the past 12 months, has lack of transportation kept you from medical appointments or from getting medications?: No    Lack of Transportation (Non-Medical): Not on file  Physical Activity: Inactive (09/20/2022)   Exercise Vital Sign    Days of Exercise per Week: 0 days    Minutes of Exercise per Session: 0 min  Stress: Stress Concern Present (09/20/2022)   [employer] of Occupational Health - Occupational Stress Questionnaire    Feeling of Stress : To some extent  Social Connections: Socially Isolated (09/20/2022)   Social Connection and Isolation Panel [NHANES]    Frequency of Communication with Friends and Family: Once a week    Frequency of Social Gatherings with Friends and Family: Never    Attends Religious Services: Never    Database administrator or Organizations: No    Attends Banker Meetings: Never    Marital Status: Never married  Intimate Partner Violence: Not At Risk (05/30/2022)   Humiliation, Afraid, Rape, and Kick questionnaire    Fear of Current or Ex-Partner: No    Emotionally Abused: No    Physically Abused: No    Sexually Abused: No    Allergies: No Known Allergies  Current Medications: Current Outpatient Medications  Medication Sig Dispense Refill   albuterol (PROVENTIL HFA;VENTOLIN HFA) 108 (90 Base) MCG/ACT inhaler Inhale 2 puffs into the lungs every 6 (six) hours as needed for wheezing or shortness of breath.     amLODipine (NORVASC) 10 MG tablet Take 10 mg by mouth daily.     atorvastatin (LIPITOR) 80 MG tablet Take 80 mg by mouth daily.     buPROPion (WELLBUTRIN XL) 300 MG 24 hr tablet Take 300 mg by mouth daily.     cholecalciferol (VITAMIN D) 400 units TABS tablet Take 400 Units by mouth daily.     citalopram (CELEXA) 20 MG tablet Take 20 mg by mouth daily.     cloNIDine (CATAPRES)  0.2 MG tablet Take 0.2 mg by mouth 2 (two) times daily.     glipiZIDE (GLUCOTROL XL) 10 MG 24 hr tablet Take 1 tablet (10 mg total) by mouth daily. Pt dropped bottle and is aware that insurance may not pay yet. May need cash pricing or coupon. Thank you. 30 tablet 0   insulin aspart (NOVOLOG) 100 UNIT/ML injection Inject 13 Units into the skin 3 (three) times daily before meals.     insulin detemir (LEVEMIR FLEXPEN) 100 UNIT/ML FlexPen Inject 25 units in the morning. Inject 23 units in the evening.     ipratropium-albuterol (DUONEB) 0.5-2.5 (3) MG/3ML SOLN Take 3 mLs by nebulization every 6 (six) hours as needed. For shortness of breath 360 mL 1   losartan-hydrochlorothiazide (HYZAAR) 100-25 MG tablet Take 1 tablet by mouth daily.     metoprolol succinate (  TOPROL-XL) 50 MG 24 hr tablet Take 1 tablet by mouth daily.     montelukast (SINGULAIR) 10 MG tablet Take 10 mg by mouth at bedtime.     ondansetron (ZOFRAN) 8 MG tablet Take by mouth.     ONETOUCH VERIO test strip 1 each 3 (three) times daily.     oxyCODONE (OXY IR/ROXICODONE) 5 MG immediate release tablet Take 5 mg by mouth every 4 (four) hours as needed for severe pain.     oxyCODONE (ROXICODONE) 5 MG immediate release tablet Take 1 tablet (5 mg total) by mouth every 8 (eight) hours as needed for severe pain. 10 tablet 0   fluticasone-salmeterol (ADVAIR) 250-50 MCG/ACT AEPB Inhale 1 puff into the lungs in the morning and at bedtime. (Patient not taking: Reported on 12/25/2022)     Insulin Degludec 100 UNIT/ML SOLN Inject into the skin. (Patient not taking: Reported on 12/25/2022)     pantoprazole (PROTONIX) 20 MG tablet Take 20 mg by mouth daily. (Patient not taking: Reported on 01/16/2023)     No current facility-administered medications for this visit.    Review of Systems General:  no complaints Skin: no complaints Eyes: no complaints HEENT: no complaints Breasts: no complaints Pulmonary: no complaints Cardiac: no  complaints Gastrointestinal: no complaints Ob/Gyn: no complaints Musculoskeletal: no complaints Hematology: no complaints Neurologic/Psych: depression   Objective:  Physical Examination:  BP (!) 180/62   Pulse (!) 56   Temp (!) 96.4 F (35.8 C)   Resp 16   Wt 193 lb (87.5 kg)   BMI 36.47 kg/m     ECOG Performance Status: 1 - Symptomatic but completely ambulatory  GENERAL: Patient is a well appearing female in no acute distress HEENT:  Sclera clear. Anicteric NODES:  Negative axillary, supraclavicular, inguinal lymph node survery LUNGS:  Clear to auscultation bilaterally.   HEART:  Regular rate and rhythm.  ABDOMEN:  Soft, nontender.  No hernias, incisions well healed. No masses or ascites EXTREMITIES:  No peripheral edema. Atraumatic. No cyanosis SKIN:  Clear with no obvious rashes or skin changes.  NEURO:  Nonfocal. Well oriented.  Appropriate affect.  Pelvic exam: Chaperoned by CMA EGBUS: no lesions Vagina: narrow with some friability of cervix.  Significant thickening, radiation effect Uterus: normal size, nontender, mobile Adnexa: no palpable masses Rectovaginal: confirmatory  Lab Review Lab Results  Component Value Date   WBC 4.3 01/16/2023   HGB 10.3 (L) 01/16/2023   HCT 31.9 (L) 01/16/2023   MCV 86.4 01/16/2023   PLT 121 (L) 01/16/2023     Chemistry      Component Value Date/Time   NA 135 01/16/2023 0934   K 3.9 01/16/2023 0934   CL 100 01/16/2023 0934   CO2 30 01/16/2023 0934   BUN 20 01/16/2023 0934   CREATININE 0.96 01/16/2023 0934      Component Value Date/Time   CALCIUM 8.4 (L) 01/16/2023 0934   ALKPHOS 75 01/16/2023 0934   AST 14 (L) 01/16/2023 0934   ALT 12 01/16/2023 0934   BILITOT 0.5 01/16/2023 0934        Assessment:  Terri [name] is a 72 y.o. 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  PET scan shows involvement of entire uterus and cervix.  No  distant disease. Bilateral common iliac nodes are borderline enlarged, but not PET positive.   Poorly differentiated cancer with TP53 overexpression and negative HPV test so unlikely to be cervical primary. She was hesitant  for chemotherapy and elected for radiation in view of her locally advanced disease.  Completed radiation 04/18/22 and on exam still had fleshy cancer involving upper vagina and flush cervix.  CT scan continues to show hypoattenuation of uterus cw persistent disease, but no evidence of distant disease.  Discussed that she was not a surgical candidate due to extensive upper vaginal involvement with cancer and decision made to start systemic therapy.  She received three cycles of carboplatin/taxol/keytruda and PET/CT 4/24 shows considerable decrease SUV in the uterus and no distant disease.  On exam 08/15/22 the vaginal disease regressed dramatically. Completed 6 cycles of carboplain/taxol/keytruda 10/04/22.  CT scan showed no evidence of residual disease.   Some vaginal spotting now.  No obvious evidence of disease.  PET scan done yesterday and pending, but looks negative to me. PAP done today.   Cancer is TP53 mutated, MSS and HER2 negative.   Medical co-morbidities complicating care: obesity, COPD, aortic stenosis, PVD.  Plan:   Problem List Items Addressed This Visit       Genitourinary   Endometrial cancer (HCC) - Primary   Other Visit Diagnoses     Vaginal bleeding          Continue maintenance Keytruda immunotherapy with Dr. Orlie Dakin. Will await results of PET and PAP.  Can consider the option of completion surgery to remove the uterus and ovaries if there is PET positivity.  She had extensive vaginal disease and now this has regressed, but there is significant radiation effect in the vagina, so surgery could be problematic in terms of healing.  And she also has significant medical comorbidities that would increase perioperative risk.   RTC in 3 months.    The patient's  diagnosis, an outline of the further diagnostic and laboratory studies which will be required, the recommendation for surgery, and alternatives were discussed with her and her accompanying family members.  All questions were answered to their satisfaction.  Consuello Masse, DNP, AGNP-C, AOCNP [facility] [phone] (clinic)   I personally interviewed and examined the patient. Agreed with the above/below plan of care. I have directly contributed to assessment and plan of care of this patient and educated and discussed with patient and family.  Leida Lauth, MD  CC:  Judithann Sheen Duane Lope, MD [address] [facility] [place],  [place] [address] [phone]

## 2023-01-16 NOTE — Patient Instructions (Signed)
[place] [facility]  Discharge Instructions: Thank you for choosing [facility] to provide your oncology and hematology care.  If you have a lab appointment with the Cancer Center, please go directly to the Cancer Center and check in at the registration area.  Wear comfortable clothing and clothing appropriate for easy access to any Portacath or PICC line.   We strive to give you quality time with your provider. You may need to reschedule your appointment if you arrive late (15 or more minutes).  Arriving late affects you and other patients whose appointments are after yours.  Also, if you miss three or more appointments without notifying the office, you may be dismissed from the clinic at the provider's discretion.      For prescription refill requests, have your pharmacy contact our office and allow 72 hours for refills to be completed.    Today you received the following chemotherapy and/or immunotherapy agents- Keytruda      To help prevent nausea and vomiting after your treatment, we encourage you to take your nausea medication as directed.  BELOW ARE SYMPTOMS THAT SHOULD BE REPORTED IMMEDIATELY: *FEVER GREATER THAN 100.4 F (38 C) OR HIGHER *CHILLS OR SWEATING *NAUSEA AND VOMITING THAT IS NOT CONTROLLED WITH YOUR NAUSEA MEDICATION *UNUSUAL SHORTNESS OF BREATH *UNUSUAL BRUISING OR BLEEDING *URINARY PROBLEMS (pain or burning when urinating, or frequent urination) *BOWEL PROBLEMS (unusual diarrhea, constipation, pain near the anus) TENDERNESS IN MOUTH AND THROAT WITH OR WITHOUT PRESENCE OF ULCERS (sore throat, sores in mouth, or a toothache) UNUSUAL RASH, SWELLING OR PAIN  UNUSUAL VAGINAL DISCHARGE OR ITCHING   Items with * indicate a potential emergency and should be followed up as soon as possible or go to the Emergency Department if any problems should occur.  Please show the CHEMOTHERAPY ALERT CARD or IMMUNOTHERAPY ALERT CARD at check-in to  the Emergency Department and triage nurse.  Should you have questions after your visit or need to cancel or reschedule your appointment, please contact [place] [facility]  [phone] and follow the prompts.  Office hours are 8:00 a.m. to 4:30 p.m. Monday - Friday. Please note that voicemails left after 4:00 p.m. may not be returned until the following business day.  We are closed weekends and major holidays. You have access to a nurse at all times for urgent questions. Please call the main number to the clinic [phone] and follow the prompts.  For any non-urgent questions, you may also contact your provider using MyChart. We now offer e-Visits for anyone 56 and older to request care online for non-urgent symptoms. For details visit [email or web address].   Also download the MyChart app! Go to the app store, search "MyChart", open the app, select [place], and log in with your MyChart username and password.

## 2023-01-16 NOTE — Progress Notes (Signed)
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  INTERVAL HISTORY: Patient returns to clinic today for further evaluation and continuation of maintenance Keytruda.  She had a fall several weeks ago and was evaluated in the ED, but left prior to completing her visit.  She had a complete tendon rupture of her right hamstring.  She denies any pain, but does state is difficult walking up stairs now.  She has some vaginal bleeding, but otherwise feels well.  She continues to tolerate her treatments without significant side effects. She has no neurologic complaints.  She denies any recent fevers or illnesses.  She has a good appetite and denies weight loss.  She has no chest pain, shortness of breath, cough, or hemoptysis.  She denies any nausea, vomiting, constipation, or diarrhea.  She has no urinary complaints.  Patient offers no further specific complaints today.  REVIEW OF SYSTEMS:   Review of Systems  Constitutional: Negative.  Negative for fever, malaise/fatigue and weight loss.  Respiratory: Negative.  Negative for cough, hemoptysis and shortness of breath.   Cardiovascular: Negative.  Negative for chest pain and leg swelling.  Gastrointestinal: Negative.  Negative for abdominal pain, blood in stool and melena.  Genitourinary: Negative.  Negative for dysuria.  Musculoskeletal: Negative.  Negative for back pain.  Skin: Negative.  Negative for rash.  Neurological: Negative.  Negative for dizziness, focal weakness, weakness and headaches.  Psychiatric/Behavioral: Negative.  The patient is not nervous/anxious.     As per HPI. Otherwise, a complete review of systems is negative.  PAST  MEDICAL HISTORY: Past Medical History:  Diagnosis Date   Adenomatous colon polyp    Aortic stenosis    Arthritis    Asthma    Claudication (HCC)    COPD (chronic obstructive pulmonary disease) (HCC)    Depression    Diabetes mellitus without complication (HCC)    Esophageal dysphagia    GERD (gastroesophageal reflux disease)    Hyperlipemia    Hypertension    Obesity    Severe obesity (BMI 35.0-35.9 with comorbidity) (HCC)     PAST SURGICAL HISTORY: Past Surgical History:  Procedure Laterality Date   BLADDER SURGERY     COLONOSCOPY WITH PROPOFOL N/A 08/05/2015   Procedure: COLONOSCOPY WITH PROPOFOL;  Surgeon: Elnita Maxwell, MD;  Location: [facility] ENDOSCOPY;  Service: Endoscopy;  Laterality: N/A;   ESOPHAGOGASTRODUODENOSCOPY N/A 01/09/2022   Procedure: ESOPHAGOGASTRODUODENOSCOPY (EGD);  Surgeon: Regis Bill, MD;  Location: [facility] ENDOSCOPY;  Service: Endoscopy;  Laterality: N/A;   FOOT SURGERY Left    FRACTURE SURGERY     IR CV LINE INJECTION  06/22/2022   IR IMAGING GUIDED PORT INSERTION  06/05/2022   IR PORT REPAIR CENTRAL VENOUS ACCESS DEVICE  07/02/2022   ORIF ANKLE FRACTURE Left 03/26/2017   Procedure: OPEN REDUCTION INTERNAL FIXATION (ORIF) ANKLE FRACTURE;  Surgeon: Christena Flake, MD;  Location: [facility] ORS;  Service: Orthopedics;  Laterality: Left;    FAMILY HISTORY: Family History  Problem Relation Age of Onset   CVA Mother    Diabetes Mother    CAD Father    Breast cancer Neg Hx     ADVANCED DIRECTIVES (Y/N):  N  HEALTH MAINTENANCE: Social History   Tobacco Use  Smoking status: Former    Current packs/day: 1.00    Average packs/day: 1 pack/day for 40.0 years (40.0 ttl pk-yrs)    Types: Cigarettes   Smokeless tobacco: Never  Vaping Use   Vaping status: Never Used  Substance Use Topics   Alcohol use: No   Drug use: No     Colonoscopy:  PAP:  Bone density:  Lipid panel:  No Known Allergies  Current Outpatient Medications  Medication Sig  Dispense Refill   albuterol (PROVENTIL HFA;VENTOLIN HFA) 108 (90 Base) MCG/ACT inhaler Inhale 2 puffs into the lungs every 6 (six) hours as needed for wheezing or shortness of breath.     amLODipine (NORVASC) 10 MG tablet Take 10 mg by mouth daily.     atorvastatin (LIPITOR) 80 MG tablet Take 80 mg by mouth daily.     buPROPion (WELLBUTRIN XL) 300 MG 24 hr tablet Take 300 mg by mouth daily.     cholecalciferol (VITAMIN D) 400 units TABS tablet Take 400 Units by mouth daily.     citalopram (CELEXA) 20 MG tablet Take 20 mg by mouth daily.     cloNIDine (CATAPRES) 0.2 MG tablet Take 0.2 mg by mouth 2 (two) times daily.     glipiZIDE (GLUCOTROL XL) 10 MG 24 hr tablet Take 1 tablet (10 mg total) by mouth daily. Pt dropped bottle and is aware that insurance may not pay yet. May need cash pricing or coupon. Thank you. 30 tablet 0   insulin aspart (NOVOLOG) 100 UNIT/ML injection Inject 13 Units into the skin 3 (three) times daily before meals.     insulin detemir (LEVEMIR FLEXPEN) 100 UNIT/ML FlexPen Inject 25 units in the morning. Inject 23 units in the evening.     ipratropium-albuterol (DUONEB) 0.5-2.5 (3) MG/3ML SOLN Take 3 mLs by nebulization every 6 (six) hours as needed. For shortness of breath 360 mL 1   losartan-hydrochlorothiazide (HYZAAR) 100-25 MG tablet Take 1 tablet by mouth daily.     metoprolol succinate (TOPROL-XL) 50 MG 24 hr tablet Take 1 tablet by mouth daily.     montelukast (SINGULAIR) 10 MG tablet Take 10 mg by mouth at bedtime.     ondansetron (ZOFRAN) 8 MG tablet Take by mouth.     ONETOUCH VERIO test strip 1 each 3 (three) times daily.     oxyCODONE (OXY IR/ROXICODONE) 5 MG immediate release tablet Take 5 mg by mouth every 4 (four) hours as needed for severe pain.     oxyCODONE (ROXICODONE) 5 MG immediate release tablet Take 1 tablet (5 mg total) by mouth every 8 (eight) hours as needed for severe pain. 10 tablet 0   fluticasone-salmeterol (ADVAIR) 250-50 MCG/ACT AEPB Inhale 1  puff into the lungs in the morning and at bedtime. (Patient not taking: Reported on 12/25/2022)     Insulin Degludec 100 UNIT/ML SOLN Inject into the skin. (Patient not taking: Reported on 12/25/2022)     pantoprazole (PROTONIX) 20 MG tablet Take 20 mg by mouth daily. (Patient not taking: Reported on 01/16/2023)     No current facility-administered medications for this visit.    OBJECTIVE: Vitals:   01/16/23 0941  BP: (!) 180/62  Pulse: (!) 56  Resp: 16  Temp: (!) 96.4 F (35.8 C)  SpO2: 98%     Body mass index is 36.47 kg/m.    ECOG FS:0 - Asymptomatic  General: Well-developed, well-nourished, no acute distress. Eyes: Pink conjunctiva, anicteric sclera. HEENT: Normocephalic, moist mucous membranes. Lungs: No audible wheezing or  coughing. Heart: Regular rate and rhythm. Abdomen: Soft, nontender, no obvious distention. Musculoskeletal: No edema, cyanosis, or clubbing. Neuro: Alert, answering all questions appropriately. Cranial nerves grossly intact. Skin: No rashes or petechiae noted. Psych: Normal affect.  LAB RESULTS:  Lab Results  Component Value Date   NA 135 01/16/2023   K 3.9 01/16/2023   CL 100 01/16/2023   CO2 30 01/16/2023   GLUCOSE 260 (H) 01/16/2023   BUN 20 01/16/2023   CREATININE 0.96 01/16/2023   CALCIUM 8.4 (L) 01/16/2023   PROT 5.9 (L) 01/16/2023   ALBUMIN 3.2 (L) 01/16/2023   AST 14 (L) 01/16/2023   ALT 12 01/16/2023   ALKPHOS 75 01/16/2023   BILITOT 0.5 01/16/2023   GFRNONAA >60 01/16/2023   GFRAA >60 03/27/2017    Lab Results  Component Value Date   WBC 4.3 01/16/2023   NEUTROABS 3.5 01/16/2023   HGB 10.3 (L) 01/16/2023   HCT 31.9 (L) 01/16/2023   MCV 86.4 01/16/2023   PLT 121 (L) 01/16/2023     STUDIES: MR HIP RIGHT WO CONTRAST  Result Date: 12/25/2022 CLINICAL DATA:  Hip trauma, fracture suspected possible ligamentous/tendinous injury. EXAM: MR OF THE RIGHT HIP WITHOUT CONTRAST TECHNIQUE: Multiplanar, multisequence MR imaging was  performed. No intravenous contrast was administered. COMPARISON:  Radiographs and CT 12/24/2022 FINDINGS: Bones: There is no evidence of acute fracture, dislocation or femoral head osteonecrosis. The visualized bony pelvis appears normal. The visualized sacroiliac joints and symphysis pubis appear normal. Postsurgical changes are again noted within the symphysis pubis. There is multilevel lumbar spondylosis with disc space narrowing, endplate osteophytes and facet hypertrophy. Articular cartilage and labrum Articular cartilage: Minimal degenerative changes of both hips without focal chondral defect or subchondral signal abnormality. Labrum: There is no gross labral tear or paralabral abnormality. Joint or bursal effusion Joint effusion: No significant hip joint effusion. Bursae: No focal periarticular fluid collection. Muscles and tendons Muscles and tendons: The right common hamstring tendon is completely ruptured and distally retracted from its ischial attachment by approximately 1 cm. There is surrounding edema which extends into the right hip adductor musculature. The distal extent of this soft tissue edema in the medial right thigh is incompletely visualized, although no focal fluid collections are identified. The left common hamstring, bilateral gluteus and iliopsoas tendons are intact. The piriformis muscles are symmetric. Other findings Miscellaneous: The visualized internal pelvic contents appear unremarkable. IMPRESSION: 1. Complete rupture of the right common hamstring tendon from its ischial attachment with mild distal retraction. Surrounding soft tissue edema extends into the right hip adductor musculature. 2. The left common hamstring, bilateral gluteus and iliopsoas tendons are intact. 3. No acute osseous findings. 4. Multilevel lumbar spondylosis. Electronically Signed   By: Carey Bullocks M.D.   On: 12/25/2022 13:13   CT Hip Right Wo Contrast  Result Date: 12/24/2022 CLINICAL DATA:  Right hip  and upper thigh pain after falling. EXAM: CT OF THE RIGHT HIP WITHOUT CONTRAST TECHNIQUE: Multidetector CT imaging of the right hip was performed according to the standard protocol. Multiplanar CT image reconstructions were also generated. RADIATION DOSE REDUCTION: This exam was performed according to the departmental dose-optimization program which includes automated exposure control, adjustment of the mA and/or kV according to patient size and/or use of iterative reconstruction technique. COMPARISON:  Radiographs same date. Pelvic CT 10/08/2022. PET-CT 08/10/2022. FINDINGS: Bones/Joint/Cartilage The bones appear adequately mineralized. There is no evidence of acute fracture, dislocation or femoral head osteonecrosis. There are mild right hip degenerative changes without significant joint  effusion. Postsurgical changes are present within the symphysis pubis bilaterally. Ligaments Suboptimally assessed by CT. Muscles and Tendons New ill-defined appearance of the adductor musculature in the proximal thigh compared with previous CTs. No focal fluid collection identified. The visualized pelvic and proximal thigh musculature otherwise appears unremarkable. Soft tissues As above, new ill-defined appearance of the adductor musculature in the proximal right thigh with associated surrounding soft tissue swelling which could reflect an acute muscular injury. No focal fluid collection, foreign body or soft tissue emphysema. IMPRESSION: 1. No evidence of acute right hip fracture or dislocation. 2. Mild right hip osteoarthritis. 3. Possible soft tissue injury involving proximal right thigh adductor musculature. Electronically Signed   By: Carey Bullocks M.D.   On: 12/24/2022 10:52   DG Pelvis 1-2 Views  Result Date: 12/24/2022 CLINICAL DATA:  Fall EXAM: PELVIS - 1-2 VIEW COMPARISON:  None Available. FINDINGS: No fracture or dislocation is seen. Bilateral hip joint spaces are preserved. Visualized bony pelvis appears  intact. Mild degenerative changes of the lower lumbar spine. IMPRESSION: Negative. Electronically Signed   By: Charline Bills M.D.   On: 12/24/2022 08:15   DG Femur Min 2 Views Right  Result Date: 12/24/2022 CLINICAL DATA:  Fall, pain EXAM: RIGHT FEMUR 2 VIEWS COMPARISON:  None Available. FINDINGS: No fracture or dislocation is seen. Right hip joint space is preserved. Mild tricompartmental degenerative changes at the knee. Visualized soft tissues are within normal limits, noting vascular calcifications. No suprapatellar knee joint effusion. IMPRESSION: Negative. Electronically Signed   By: Charline Bills M.D.   On: 12/24/2022 08:15   CT Head Wo Contrast  Result Date: 12/19/2022 CLINICAL DATA:  fall; Facial trauma, blunt; Neck trauma (Age >= 65y) EXAM: CT HEAD WITHOUT CONTRAST CT MAXILLOFACIAL WITHOUT CONTRAST CT CERVICAL SPINE WITHOUT CONTRAST TECHNIQUE: Multidetector CT imaging of the head, cervical spine, and maxillofacial structures were performed using the standard protocol without intravenous contrast. Multiplanar CT image reconstructions of the cervical spine and maxillofacial structures were also generated. RADIATION DOSE REDUCTION: This exam was performed according to the departmental dose-optimization program which includes automated exposure control, adjustment of the mA and/or kV according to patient size and/or use of iterative reconstruction technique. COMPARISON:  None Available. FINDINGS: CT HEAD FINDINGS Brain: No evidence of acute infarction, hemorrhage, hydrocephalus, extra-axial collection or mass lesion/mass effect. Vascular: No hyperdense vessel or unexpected calcification. Skull: Normal. Negative for fracture or focal lesion. Other: None. CT MAXILLOFACIAL FINDINGS Osseous: There is a displaced fracture of the orbital floor with herniation of the inferior rectus muscle body through the fracture site. Orbits: There is air in the intraconal compartment of the orbit abutting the  right optic nerve. No evidence of an intraorbital hematoma. Right globe is normal in appearance. Bilateral lens replacement. Sinuses: No middle ear or mastoid effusion. Small amount of layering blood products in the right maxillary sinus. Paranasal sinuses clear. Soft tissues: There is subcutaneous gas extending superiorly to the periorbital soft tissues. CT CERVICAL SPINE FINDINGS Alignment: Normal. Skull base and vertebrae: No acute fracture. No primary bone lesion or focal pathologic process. Soft tissues and spinal canal: No prevertebral fluid or swelling. No visible canal hematoma. There is subcutaneous emphysema that extends inferiorly to the level of the thyroid and superiorly to the level of the periorbital soft tissues. Disc levels:  No evidence of high-grade spinal canal stenosis. Upper chest: Negative. Other: None IMPRESSION: 1. No acute intracranial abnormality. 2. Right orbital floor fracture with herniation of the inferior rectus muscle body through the  fracture site. Findings can be seen in the setting of entrapment. Recommend ophthalmologic consultation. 3. Air in the intraconal compartment of the orbit abutting the right optic nerve. No evidence of an intraorbital hematoma. 4. No acute fracture or traumatic subluxation of the cervical spine. 5. Subcutaneous emphysema that extends inferiorly to the level of the thyroid and superiorly to the level of the periorbital soft tissues. This may be secondary to an additional occult maxillary sinus injury, possibly along the anterior wall. Electronically Signed   By: Lorenza Cambridge M.D.   On: 12/19/2022 12:21   CT Cervical Spine Wo Contrast  Result Date: 12/19/2022 CLINICAL DATA:  fall; Facial trauma, blunt; Neck trauma (Age >= 65y) EXAM: CT HEAD WITHOUT CONTRAST CT MAXILLOFACIAL WITHOUT CONTRAST CT CERVICAL SPINE WITHOUT CONTRAST TECHNIQUE: Multidetector CT imaging of the head, cervical spine, and maxillofacial structures were performed using the standard  protocol without intravenous contrast. Multiplanar CT image reconstructions of the cervical spine and maxillofacial structures were also generated. RADIATION DOSE REDUCTION: This exam was performed according to the departmental dose-optimization program which includes automated exposure control, adjustment of the mA and/or kV according to patient size and/or use of iterative reconstruction technique. COMPARISON:  None Available. FINDINGS: CT HEAD FINDINGS Brain: No evidence of acute infarction, hemorrhage, hydrocephalus, extra-axial collection or mass lesion/mass effect. Vascular: No hyperdense vessel or unexpected calcification. Skull: Normal. Negative for fracture or focal lesion. Other: None. CT MAXILLOFACIAL FINDINGS Osseous: There is a displaced fracture of the orbital floor with herniation of the inferior rectus muscle body through the fracture site. Orbits: There is air in the intraconal compartment of the orbit abutting the right optic nerve. No evidence of an intraorbital hematoma. Right globe is normal in appearance. Bilateral lens replacement. Sinuses: No middle ear or mastoid effusion. Small amount of layering blood products in the right maxillary sinus. Paranasal sinuses clear. Soft tissues: There is subcutaneous gas extending superiorly to the periorbital soft tissues. CT CERVICAL SPINE FINDINGS Alignment: Normal. Skull base and vertebrae: No acute fracture. No primary bone lesion or focal pathologic process. Soft tissues and spinal canal: No prevertebral fluid or swelling. No visible canal hematoma. There is subcutaneous emphysema that extends inferiorly to the level of the thyroid and superiorly to the level of the periorbital soft tissues. Disc levels:  No evidence of high-grade spinal canal stenosis. Upper chest: Negative. Other: None IMPRESSION: 1. No acute intracranial abnormality. 2. Right orbital floor fracture with herniation of the inferior rectus muscle body through the fracture site.  Findings can be seen in the setting of entrapment. Recommend ophthalmologic consultation. 3. Air in the intraconal compartment of the orbit abutting the right optic nerve. No evidence of an intraorbital hematoma. 4. No acute fracture or traumatic subluxation of the cervical spine. 5. Subcutaneous emphysema that extends inferiorly to the level of the thyroid and superiorly to the level of the periorbital soft tissues. This may be secondary to an additional occult maxillary sinus injury, possibly along the anterior wall. Electronically Signed   By: Lorenza Cambridge M.D.   On: 12/19/2022 12:21   CT Maxillofacial WO CM  Result Date: 12/19/2022 CLINICAL DATA:  fall; Facial trauma, blunt; Neck trauma (Age >= 65y) EXAM: CT HEAD WITHOUT CONTRAST CT MAXILLOFACIAL WITHOUT CONTRAST CT CERVICAL SPINE WITHOUT CONTRAST TECHNIQUE: Multidetector CT imaging of the head, cervical spine, and maxillofacial structures were performed using the standard protocol without intravenous contrast. Multiplanar CT image reconstructions of the cervical spine and maxillofacial structures were also generated. RADIATION DOSE REDUCTION: This  exam was performed according to the departmental dose-optimization program which includes automated exposure control, adjustment of the mA and/or kV according to patient size and/or use of iterative reconstruction technique. COMPARISON:  None Available. FINDINGS: CT HEAD FINDINGS Brain: No evidence of acute infarction, hemorrhage, hydrocephalus, extra-axial collection or mass lesion/mass effect. Vascular: No hyperdense vessel or unexpected calcification. Skull: Normal. Negative for fracture or focal lesion. Other: None. CT MAXILLOFACIAL FINDINGS Osseous: There is a displaced fracture of the orbital floor with herniation of the inferior rectus muscle body through the fracture site. Orbits: There is air in the intraconal compartment of the orbit abutting the right optic nerve. No evidence of an intraorbital  hematoma. Right globe is normal in appearance. Bilateral lens replacement. Sinuses: No middle ear or mastoid effusion. Small amount of layering blood products in the right maxillary sinus. Paranasal sinuses clear. Soft tissues: There is subcutaneous gas extending superiorly to the periorbital soft tissues. CT CERVICAL SPINE FINDINGS Alignment: Normal. Skull base and vertebrae: No acute fracture. No primary bone lesion or focal pathologic process. Soft tissues and spinal canal: No prevertebral fluid or swelling. No visible canal hematoma. There is subcutaneous emphysema that extends inferiorly to the level of the thyroid and superiorly to the level of the periorbital soft tissues. Disc levels:  No evidence of high-grade spinal canal stenosis. Upper chest: Negative. Other: None IMPRESSION: 1. No acute intracranial abnormality. 2. Right orbital floor fracture with herniation of the inferior rectus muscle body through the fracture site. Findings can be seen in the setting of entrapment. Recommend ophthalmologic consultation. 3. Air in the intraconal compartment of the orbit abutting the right optic nerve. No evidence of an intraorbital hematoma. 4. No acute fracture or traumatic subluxation of the cervical spine. 5. Subcutaneous emphysema that extends inferiorly to the level of the thyroid and superiorly to the level of the periorbital soft tissues. This may be secondary to an additional occult maxillary sinus injury, possibly along the anterior wall. Electronically Signed   By: Lorenza Cambridge M.D.   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  CT scan results from October 08, 2022 reviewed independently with no obvious evidence of progressive disease. She  completed 6 cycles of carboplatinum, Taxol, and Keytruda on October 03, 2022.  Patient then initiated maintenance Keytruda on October 23, 2022.  Recommendation is to complete at least 2 years of maintenance treatment.  Proceed with cycle 4 of treatment today.  Patient had a PET scan yesterday and results are pending at time of dictation.  Return to clinic in 3 weeks for further evaluation, discussion of her imaging results, and continuation of treatment.  Port: Port revision successful.  Proceed with treatment as above. Anemia: Hemoglobin continues to slowly improve and is now 10.3.  Monitor. Thrombocytopenia: Chronic and unchanged.  Patient platelet count is 121.Marland Kitchen Hypertension: Patient's blood sugar is significantly elevated today.  Continue monitoring and treatment per primary care. Claudication symptoms: Patient was previously given a referral to vascular surgery.  She reports her appointment is later this week. Right hamstring tendon rupture: Secondary to fall.  Patient has agreed to an orthopedics referral to assess treatment or therapy options.   Patient expressed understanding and was in agreement with this plan. She also understands that She  can call clinic at any time with any questions, concerns, or complaints.    Cancer Staging  Endometrial cancer [facility]) Staging form: Corpus Uteri - Carcinoma and Carcinosarcoma, AJCC 8th Edition - Clinical stage from 05/30/2022: FIGO Stage IIIB (cT3b, cN0, cM0) - Signed by Jeralyn Ruths, MD on 05/30/2022 Stage prefix: Initial diagnosis   Jeralyn Ruths, MD   01/16/2023 10:13 AM

## 2023-01-16 NOTE — Progress Notes (Signed)
Having vaginal bleeding x1 month. Describes it as a "light" bleeding.

## 2023-01-17 ENCOUNTER — Other Ambulatory Visit: Payer: Self-pay

## 2023-01-17 DIAGNOSIS — Z9889 Other specified postprocedural states: Secondary | ICD-10-CM | POA: Diagnosis not present

## 2023-01-17 DIAGNOSIS — Z87891 Personal history of nicotine dependence: Secondary | ICD-10-CM | POA: Diagnosis not present

## 2023-01-17 DIAGNOSIS — S0285XD Fracture of orbit, unspecified, subsequent encounter for fracture with routine healing: Secondary | ICD-10-CM | POA: Diagnosis not present

## 2023-01-17 LAB — T4: T4, Total: 7.9 ug/dL (ref 4.5–12.0)

## 2023-01-18 ENCOUNTER — Ambulatory Visit (INDEPENDENT_AMBULATORY_CARE_PROVIDER_SITE_OTHER): Payer: PPO | Admitting: Nurse Practitioner

## 2023-01-18 ENCOUNTER — Inpatient Hospital Stay: Payer: PPO

## 2023-01-18 ENCOUNTER — Ambulatory Visit (INDEPENDENT_AMBULATORY_CARE_PROVIDER_SITE_OTHER): Payer: PPO

## 2023-01-18 VITALS — BP 138/59 | HR 60 | Resp 18 | Ht 61.0 in | Wt 194.2 lb

## 2023-01-18 DIAGNOSIS — I739 Peripheral vascular disease, unspecified: Secondary | ICD-10-CM | POA: Diagnosis not present

## 2023-01-18 DIAGNOSIS — E119 Type 2 diabetes mellitus without complications: Secondary | ICD-10-CM | POA: Diagnosis not present

## 2023-01-18 DIAGNOSIS — I10 Essential (primary) hypertension: Secondary | ICD-10-CM | POA: Diagnosis not present

## 2023-01-18 DIAGNOSIS — M79605 Pain in left leg: Secondary | ICD-10-CM

## 2023-01-21 ENCOUNTER — Encounter (INDEPENDENT_AMBULATORY_CARE_PROVIDER_SITE_OTHER): Payer: Self-pay | Admitting: Nurse Practitioner

## 2023-01-21 NOTE — Progress Notes (Signed)
Subjective:    Patient ID: Terri [name], female    DOB: 1951-02-27, 72 y.o.   MRN: [id number] Chief Complaint  Patient presents with   Establish Care    Terri [name] is a 72 year old female who presents today for claudication-like symptoms.  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  She notes that the claudication symptoms have become significant and disabling.   Claudication symptoms have progressed significantly so that the walking distance begins to have significant problems and issues with her daily life.  She currently has no rest pain like symptoms or open wounds or ulcerations.    Today the patient has ABI 0.2 on the right and 0.72 on the left.  She has monophasic tibial artery waveforms bilaterally.    Review of Systems  Cardiovascular:        Claudication  All other systems reviewed and are negative.      Objective:   Physical Exam Vitals reviewed.  HENT:     Head: Normocephalic.  Cardiovascular:     Rate and Rhythm: Normal rate.     Pulses:          Dorsalis pedis pulses are detected w/ Doppler on the right side and detected w/ Doppler on the left side.       Posterior tibial pulses are detected w/ Doppler on the right side and detected w/ Doppler on the left side.  Pulmonary:     Effort: Pulmonary effort is normal.  Skin:    General: Skin is warm and dry.  Neurological:     Mental Status: She is alert and oriented to person, place, and time.  Psychiatric:        Mood and Affect: Mood normal.        Behavior: Behavior normal.        Thought Content: Thought content normal.        Judgment: Judgment normal.     BP (!) 138/59 (BP Location: Left Arm)   Pulse 60   Resp 18   Ht 5\' 1"  (1.549 m)   Wt 194 lb 3.2 oz (88.1 kg)   BMI 36.69 kg/m   Past Medical History:  Diagnosis Date   Adenomatous colon polyp    Aortic stenosis    Arthritis    Asthma    Claudication (HCC)    COPD  (chronic obstructive pulmonary disease) (HCC)    Depression    Diabetes mellitus without complication (HCC)    Esophageal dysphagia    GERD (gastroesophageal reflux disease)    Hyperlipemia    Hypertension    Obesity    Severe obesity (BMI 35.0-35.9 with comorbidity) (HCC)     Social History   Socioeconomic History   Marital status: Single    Spouse name: Not on file   Number of children: Not on file   Years of education: Not on file   Highest education level: Not on file  Occupational History   Not on file  Tobacco Use   Smoking status: Former    Current packs/day: 1.00    Average packs/day: 1 pack/day for 40.0 years (40.0 ttl pk-yrs)    Types: Cigarettes   Smokeless tobacco: Never  Vaping Use   Vaping status: Never Used  Substance and Sexual Activity   Alcohol use: No   Drug use: No   Sexual activity: Not on file  Other Topics Concern  Not on file  Social History Narrative   Not on file   Social Determinants of Health   Financial Resource Strain: Low Risk  (12/20/2022)   Received from [facility]   Overall Financial Resource Strain (CARDIA)    Difficulty of Paying Living Expenses: Not very hard  Food Insecurity: No Food Insecurity (12/20/2022)   Received from [facility]   Hunger Vital Sign    Worried About Running Out of Food in the Last Year: Never true    Ran Out of Food in the Last Year: Never true  Transportation Needs: Unknown (12/20/2022)   Received from [facility] - Transportation    In the past 12 months, has lack of transportation kept you from medical appointments or from getting medications?: No    Lack of Transportation (Non-Medical): Not on file  Physical Activity: Inactive (09/20/2022)   Exercise Vital Sign    Days of Exercise per Week: 0 days    Minutes of Exercise per Session: 0 min  Stress: Stress Concern Present (09/20/2022)   [employer] of Occupational [employer] -  Occupational Stress Questionnaire    Feeling of Stress : To some extent  Social Connections: Socially Isolated (09/20/2022)   Social Connection and Isolation Panel [NHANES]    Frequency of Communication with Friends and Family: Once a week    Frequency of Social Gatherings with Friends and Family: Never    Attends Religious Services: Never    Database administrator or Organizations: No    Attends Banker Meetings: Never    Marital Status: Never married  Intimate Partner Violence: Not At Risk (05/30/2022)   Humiliation, Afraid, Rape, and Kick questionnaire    Fear of Current or Ex-Partner: No    Emotionally Abused: No    Physically Abused: No    Sexually Abused: No    Past Surgical History:  Procedure Laterality Date   BLADDER SURGERY     COLONOSCOPY WITH PROPOFOL N/A 08/05/2015   Procedure: COLONOSCOPY WITH PROPOFOL;  Surgeon: Elnita Maxwell, MD;  Location: [facility] ENDOSCOPY;  Service: Endoscopy;  Laterality: N/A;   ESOPHAGOGASTRODUODENOSCOPY N/A 01/09/2022   Procedure: ESOPHAGOGASTRODUODENOSCOPY (EGD);  Surgeon: Regis Bill, MD;  Location: [facility] ENDOSCOPY;  Service: Endoscopy;  Laterality: N/A;   FOOT SURGERY Left    FRACTURE SURGERY     IR CV LINE INJECTION  06/22/2022   IR IMAGING GUIDED PORT INSERTION  06/05/2022   IR PORT REPAIR CENTRAL VENOUS ACCESS DEVICE  07/02/2022   ORIF ANKLE FRACTURE Left 03/26/2017   Procedure: OPEN REDUCTION INTERNAL FIXATION (ORIF) ANKLE FRACTURE;  Surgeon: Christena Flake, MD;  Location: [facility] ORS;  Service: Orthopedics;  Laterality: Left;    Family History  Problem Relation Age of Onset   CVA Mother    Diabetes Mother    CAD Father    Breast cancer Neg Hx     No Known Allergies     Latest Ref Rng & Units 01/16/2023    9:34 AM 12/19/2022   10:59 AM 11/14/2022    8:40 AM  CBC  WBC 4.0 - 10.5 K/uL 4.3  6.6  4.5   Hemoglobin 12.0 - 15.0 g/dL 54.0  98.1  9.9   Hematocrit 36.0 - 46.0 % 31.9  38.8  31.0   Platelets 150 - 400 K/uL  121  147  138       CMP     Component Value Date/Time   NA  135 01/16/2023 0934   K 3.9 01/16/2023 0934   CL 100 01/16/2023 0934   CO2 30 01/16/2023 0934   GLUCOSE 260 (H) 01/16/2023 0934   BUN 20 01/16/2023 0934   CREATININE 0.96 01/16/2023 0934   CALCIUM 8.4 (L) 01/16/2023 0934   PROT 5.9 (L) 01/16/2023 0934   ALBUMIN 3.2 (L) 01/16/2023 0934   AST 14 (L) 01/16/2023 0934   ALT 12 01/16/2023 0934   ALKPHOS 75 01/16/2023 0934   BILITOT 0.5 01/16/2023 0934   GFRNONAA >60 01/16/2023 0934     No results found.     Assessment & Plan:   1. Claudication [facility]) Recommend:  The patient has experienced increased claudication symptoms and is now describing lifestyle limiting claudication and appears to be having mild rest pain symptroms.  Given the severity of the patient's severe left lower extremity symptoms the patient should undergo angiography with the hope for intervention.  Risk and benefits were reviewed the patient.  Indications for the procedure were reviewed.  All questions were answered, the patient agrees to proceed with left lower extremity angiography and possible intervention.   The patient should continue walking and begin a more formal exercise program.  The patient should continue antiplatelet therapy and aggressive treatment of the lipid abnormalities  The patient will follow up with me after the angiogram.   2. Essential hypertension Continue antihypertensive medications as already ordered, these medications have been reviewed and there are no changes at this time.  3. Type 2 diabetes mellitus without complication, unspecified whether long term insulin use (HCC) Continue hypoglycemic medications as already ordered, these medications have been reviewed and there are no changes at this time.  Hgb A1C to be monitored as already arranged by primary service   Current Outpatient Medications on File Prior to Visit  Medication Sig Dispense Refill   albuterol  (PROVENTIL HFA;VENTOLIN HFA) 108 (90 Base) MCG/ACT inhaler Inhale 2 puffs into the lungs every 6 (six) hours as needed for wheezing or shortness of breath.     amLODipine (NORVASC) 10 MG tablet Take 10 mg by mouth daily.     atorvastatin (LIPITOR) 80 MG tablet Take 80 mg by mouth daily.     buPROPion (WELLBUTRIN XL) 300 MG 24 hr tablet Take 300 mg by mouth daily.     Cholecalciferol (VITAMIN D3) 50 MCG (2000 UT) capsule Take 2,000 Units by mouth daily.     citalopram (CELEXA) 20 MG tablet Take 20 mg by mouth daily.     cloNIDine (CATAPRES) 0.2 MG tablet Take 0.2 mg by mouth 2 (two) times daily.     glipiZIDE (GLUCOTROL XL) 10 MG 24 hr tablet Take 1 tablet (10 mg total) by mouth daily. Pt dropped bottle and is aware that insurance may not pay yet. May need cash pricing or coupon. Thank you. 30 tablet 0   insulin aspart (NOVOLOG) 100 UNIT/ML injection Inject 13 Units into the skin 3 (three) times daily before meals.     insulin detemir (LEVEMIR FLEXPEN) 100 UNIT/ML FlexPen Inject 25 units in the morning. Inject 23 units in the evening.     losartan-hydrochlorothiazide (HYZAAR) 100-25 MG tablet Take 1 tablet by mouth daily.     metoprolol succinate (TOPROL-XL) 50 MG 24 hr tablet Take 1 tablet by mouth daily.     montelukast (SINGULAIR) 10 MG tablet Take 10 mg by mouth at bedtime.     ONETOUCH VERIO test strip 1 each 3 (three) times daily.     Insulin Degludec  100 UNIT/ML SOLN Inject into the skin. (Patient not taking: Reported on 12/25/2022)     oxyCODONE (OXY IR/ROXICODONE) 5 MG immediate release tablet Take 5 mg by mouth every 4 (four) hours as needed for severe pain. (Patient not taking: Reported on 01/18/2023)     oxyCODONE (ROXICODONE) 5 MG immediate release tablet Take 1 tablet (5 mg total) by mouth every 8 (eight) hours as needed for severe pain. (Patient not taking: Reported on 01/18/2023) 10 tablet 0   No current facility-administered medications on file prior to visit.    There are no  Patient Instructions on file for this visit. No follow-ups on file.   Georgiana Spinner, NP

## 2023-01-21 NOTE — H&P (View-Only) (Signed)
Subjective:    Patient ID: Terri [name], female    DOB: 1951-02-27, 72 y.o.   MRN: [id number] Chief Complaint  Patient presents with   Establish Care    Terri [name] is a 72 year old female who presents today for claudication-like symptoms.  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  She notes that the claudication symptoms have become significant and disabling.   Claudication symptoms have progressed significantly so that the walking distance begins to have significant problems and issues with her daily life.  She currently has no rest pain like symptoms or open wounds or ulcerations.    Today the patient has ABI 0.2 on the right and 0.72 on the left.  She has monophasic tibial artery waveforms bilaterally.    Review of Systems  Cardiovascular:        Claudication  All other systems reviewed and are negative.      Objective:   Physical Exam Vitals reviewed.  HENT:     Head: Normocephalic.  Cardiovascular:     Rate and Rhythm: Normal rate.     Pulses:          Dorsalis pedis pulses are detected w/ Doppler on the right side and detected w/ Doppler on the left side.       Posterior tibial pulses are detected w/ Doppler on the right side and detected w/ Doppler on the left side.  Pulmonary:     Effort: Pulmonary effort is normal.  Skin:    General: Skin is warm and dry.  Neurological:     Mental Status: She is alert and oriented to person, place, and time.  Psychiatric:        Mood and Affect: Mood normal.        Behavior: Behavior normal.        Thought Content: Thought content normal.        Judgment: Judgment normal.     BP (!) 138/59 (BP Location: Left Arm)   Pulse 60   Resp 18   Ht 5\' 1"  (1.549 m)   Wt 194 lb 3.2 oz (88.1 kg)   BMI 36.69 kg/m   Past Medical History:  Diagnosis Date   Adenomatous colon polyp    Aortic stenosis    Arthritis    Asthma    Claudication (HCC)    COPD  (chronic obstructive pulmonary disease) (HCC)    Depression    Diabetes mellitus without complication (HCC)    Esophageal dysphagia    GERD (gastroesophageal reflux disease)    Hyperlipemia    Hypertension    Obesity    Severe obesity (BMI 35.0-35.9 with comorbidity) (HCC)     Social History   Socioeconomic History   Marital status: Single    Spouse name: Not on file   Number of children: Not on file   Years of education: Not on file   Highest education level: Not on file  Occupational History   Not on file  Tobacco Use   Smoking status: Former    Current packs/day: 1.00    Average packs/day: 1 pack/day for 40.0 years (40.0 ttl pk-yrs)    Types: Cigarettes   Smokeless tobacco: Never  Vaping Use   Vaping status: Never Used  Substance and Sexual Activity   Alcohol use: No   Drug use: No   Sexual activity: Not on file  Other Topics Concern  Not on file  Social History Narrative   Not on file   Social Determinants of Health   Financial Resource Strain: Low Risk  (12/20/2022)   Received from [facility]   Overall Financial Resource Strain (CARDIA)    Difficulty of Paying Living Expenses: Not very hard  Food Insecurity: No Food Insecurity (12/20/2022)   Received from [facility]   Hunger Vital Sign    Worried About Running Out of Food in the Last Year: Never true    Ran Out of Food in the Last Year: Never true  Transportation Needs: Unknown (12/20/2022)   Received from [facility] - Transportation    In the past 12 months, has lack of transportation kept you from medical appointments or from getting medications?: No    Lack of Transportation (Non-Medical): Not on file  Physical Activity: Inactive (09/20/2022)   Exercise Vital Sign    Days of Exercise per Week: 0 days    Minutes of Exercise per Session: 0 min  Stress: Stress Concern Present (09/20/2022)   [employer] of [employer]ational [employer] -  Occupational Stress Questionnaire    Feeling of Stress : To some extent  Social Connections: Socially Isolated (09/20/2022)   Social Connection and Isolation Panel [NHANES]    Frequency of Communication with Friends and Family: Once a week    Frequency of Social Gatherings with Friends and Family: Never    Attends Religious Services: Never    Database administrator or Organizations: No    Attends Banker Meetings: Never    Marital Status: Never married  Intimate Partner Violence: Not At Risk (05/30/2022)   Humiliation, Afraid, Rape, and Kick questionnaire    Fear of Current or Ex-Partner: No    Emotionally Abused: No    Physically Abused: No    Sexually Abused: No    Past Surgical History:  Procedure Laterality Date   BLADDER SURGERY     COLONOSCOPY WITH PROPOFOL N/A 08/05/2015   Procedure: COLONOSCOPY WITH PROPOFOL;  Surgeon: Elnita Maxwell, MD;  Location: [facility] ENDOSCOPY;  Service: Endoscopy;  Laterality: N/A;   ESOPHAGOGASTRODUODENOSCOPY N/A 01/09/2022   Procedure: ESOPHAGOGASTRODUODENOSCOPY (EGD);  Surgeon: Regis Bill, MD;  Location: [facility] ENDOSCOPY;  Service: Endoscopy;  Laterality: N/A;   FOOT SURGERY Left    FRACTURE SURGERY     IR CV LINE INJECTION  06/22/2022   IR IMAGING GUIDED PORT INSERTION  06/05/2022   IR PORT REPAIR CENTRAL VENOUS ACCESS DEVICE  07/02/2022   ORIF ANKLE FRACTURE Left 03/26/2017   Procedure: OPEN REDUCTION INTERNAL FIXATION (ORIF) ANKLE FRACTURE;  Surgeon: Christena Flake, MD;  Location: [facility] ORS;  Service: Orthopedics;  Laterality: Left;    Family History  Problem Relation Age of Onset   CVA Mother    Diabetes Mother    CAD Father    Breast cancer Neg Hx     No Known Allergies     Latest Ref Rng & Units 01/16/2023    9:34 AM 12/19/2022   10:59 AM 11/14/2022    8:40 AM  CBC  WBC 4.0 - 10.5 K/uL 4.3  6.6  4.5   Hemoglobin 12.0 - 15.0 g/dL 54.0  98.1  9.9   Hematocrit 36.0 - 46.0 % 31.9  38.8  31.0   Platelets 150 - 400 K/uL  121  147  138       CMP     Component Value Date/Time   NA  135 01/16/2023 0934   K 3.9 01/16/2023 0934   CL 100 01/16/2023 0934   CO2 30 01/16/2023 0934   GLUCOSE 260 (H) 01/16/2023 0934   BUN 20 01/16/2023 0934   CREATININE 0.96 01/16/2023 0934   CALCIUM 8.4 (L) 01/16/2023 0934   PROT 5.9 (L) 01/16/2023 0934   ALBUMIN 3.2 (L) 01/16/2023 0934   AST 14 (L) 01/16/2023 0934   ALT 12 01/16/2023 0934   ALKPHOS 75 01/16/2023 0934   BILITOT 0.5 01/16/2023 0934   GFRNONAA >60 01/16/2023 0934     No results found.     Assessment & Plan:   1. Claudication [facility]) Recommend:  The patient has experienced increased claudication symptoms and is now describing lifestyle limiting claudication and appears to be having mild rest pain symptroms.  Given the severity of the patient's severe left lower extremity symptoms the patient should undergo angiography with the hope for intervention.  Risk and benefits were reviewed the patient.  Indications for the procedure were reviewed.  All questions were answered, the patient agrees to proceed with left lower extremity angiography and possible intervention.   The patient should continue walking and begin a more formal exercise program.  The patient should continue antiplatelet therapy and aggressive treatment of the lipid abnormalities  The patient will follow up with me after the angiogram.   2. Essential hypertension Continue antihypertensive medications as already ordered, these medications have been reviewed and there are no changes at this time.  3. Type 2 diabetes mellitus without complication, unspecified whether long term insulin use (HCC) Continue hypoglycemic medications as already ordered, these medications have been reviewed and there are no changes at this time.  Hgb A1C to be monitored as already arranged by primary service   Current Outpatient Medications on File Prior to Visit  Medication Sig Dispense Refill   albuterol  (PROVENTIL HFA;VENTOLIN HFA) 108 (90 Base) MCG/ACT inhaler Inhale 2 puffs into the lungs every 6 (six) hours as needed for wheezing or shortness of breath.     amLODipine (NORVASC) 10 MG tablet Take 10 mg by mouth daily.     atorvastatin (LIPITOR) 80 MG tablet Take 80 mg by mouth daily.     buPROPion (WELLBUTRIN XL) 300 MG 24 hr tablet Take 300 mg by mouth daily.     Cholecalciferol (VITAMIN D3) 50 MCG (2000 UT) capsule Take 2,000 Units by mouth daily.     citalopram (CELEXA) 20 MG tablet Take 20 mg by mouth daily.     cloNIDine (CATAPRES) 0.2 MG tablet Take 0.2 mg by mouth 2 (two) times daily.     glipiZIDE (GLUCOTROL XL) 10 MG 24 hr tablet Take 1 tablet (10 mg total) by mouth daily. Pt dropped bottle and is aware that insurance may not pay yet. May need cash pricing or coupon. Thank you. 30 tablet 0   insulin aspart (NOVOLOG) 100 UNIT/ML injection Inject 13 Units into the skin 3 (three) times daily before meals.     insulin detemir (LEVEMIR FLEXPEN) 100 UNIT/ML FlexPen Inject 25 units in the morning. Inject 23 units in the evening.     losartan-hydrochlorothiazide (HYZAAR) 100-25 MG tablet Take 1 tablet by mouth daily.     metoprolol succinate (TOPROL-XL) 50 MG 24 hr tablet Take 1 tablet by mouth daily.     montelukast (SINGULAIR) 10 MG tablet Take 10 mg by mouth at bedtime.     ONETOUCH VERIO test strip 1 each 3 (three) times daily.     Insulin Degludec  100 UNIT/ML SOLN Inject into the skin. (Patient not taking: Reported on 12/25/2022)     oxyCODONE (OXY IR/ROXICODONE) 5 MG immediate release tablet Take 5 mg by mouth every 4 (four) hours as needed for severe pain. (Patient not taking: Reported on 01/18/2023)     oxyCODONE (ROXICODONE) 5 MG immediate release tablet Take 1 tablet (5 mg total) by mouth every 8 (eight) hours as needed for severe pain. (Patient not taking: Reported on 01/18/2023) 10 tablet 0   No current facility-administered medications on file prior to visit.    There are no  Patient Instructions on file for this visit. No follow-ups on file.   Georgiana Spinner, NP

## 2023-01-22 ENCOUNTER — Telehealth (INDEPENDENT_AMBULATORY_CARE_PROVIDER_SITE_OTHER): Payer: Self-pay

## 2023-01-22 NOTE — Telephone Encounter (Addendum)
Spoke with the patient and she is scheduled with Dr. Wyn Quaker for a LLE angio with a 11:30 am arrival to the [facility]. Pre-procedure instructions were discussed and will be sent to Mychart and mailed. Patient was offered 02/04/23 and declined.

## 2023-01-22 NOTE — Telephone Encounter (Signed)
Patient called back to reschedule to the 02/04/23 date that was offered originally. Patient rescheduled to to 02/04/23 with a 6:45 am arrival time to the [facility]. Pre-procedure instructions will be sent to Mychart and mailed.

## 2023-01-23 ENCOUNTER — Ambulatory Visit: Payer: PPO

## 2023-01-23 LAB — IGP, APTIMA HPV: HPV Aptima: NEGATIVE

## 2023-01-24 ENCOUNTER — Encounter: Payer: Self-pay | Admitting: Family Medicine

## 2023-01-24 ENCOUNTER — Ambulatory Visit (INDEPENDENT_AMBULATORY_CARE_PROVIDER_SITE_OTHER): Payer: PPO | Admitting: Family Medicine

## 2023-01-24 VITALS — BP 134/58 | HR 52 | Temp 97.7°F | Resp 16 | Ht 61.0 in | Wt 191.4 lb

## 2023-01-24 DIAGNOSIS — F39 Unspecified mood [affective] disorder: Secondary | ICD-10-CM

## 2023-01-24 DIAGNOSIS — I700 Atherosclerosis of aorta: Secondary | ICD-10-CM | POA: Diagnosis not present

## 2023-01-24 DIAGNOSIS — I739 Peripheral vascular disease, unspecified: Secondary | ICD-10-CM

## 2023-01-24 DIAGNOSIS — Z794 Long term (current) use of insulin: Secondary | ICD-10-CM

## 2023-01-24 DIAGNOSIS — Z23 Encounter for immunization: Secondary | ICD-10-CM | POA: Diagnosis not present

## 2023-01-24 DIAGNOSIS — I152 Hypertension secondary to endocrine disorders: Secondary | ICD-10-CM | POA: Diagnosis not present

## 2023-01-24 DIAGNOSIS — E118 Type 2 diabetes mellitus with unspecified complications: Secondary | ICD-10-CM

## 2023-01-24 DIAGNOSIS — R0609 Other forms of dyspnea: Secondary | ICD-10-CM | POA: Diagnosis not present

## 2023-01-24 DIAGNOSIS — E119 Type 2 diabetes mellitus without complications: Secondary | ICD-10-CM

## 2023-01-24 DIAGNOSIS — E611 Iron deficiency: Secondary | ICD-10-CM

## 2023-01-24 DIAGNOSIS — C541 Malignant neoplasm of endometrium: Secondary | ICD-10-CM | POA: Diagnosis not present

## 2023-01-24 DIAGNOSIS — Z7984 Long term (current) use of oral hypoglycemic drugs: Secondary | ICD-10-CM

## 2023-01-24 DIAGNOSIS — E1159 Type 2 diabetes mellitus with other circulatory complications: Secondary | ICD-10-CM

## 2023-01-24 DIAGNOSIS — Z1159 Encounter for screening for other viral diseases: Secondary | ICD-10-CM

## 2023-01-24 DIAGNOSIS — E1169 Type 2 diabetes mellitus with other specified complication: Secondary | ICD-10-CM

## 2023-01-24 DIAGNOSIS — E785 Hyperlipidemia, unspecified: Secondary | ICD-10-CM

## 2023-01-24 DIAGNOSIS — E559 Vitamin D deficiency, unspecified: Secondary | ICD-10-CM | POA: Diagnosis not present

## 2023-01-24 DIAGNOSIS — E538 Deficiency of other specified B group vitamins: Secondary | ICD-10-CM | POA: Diagnosis not present

## 2023-01-24 DIAGNOSIS — J432 Centrilobular emphysema: Secondary | ICD-10-CM

## 2023-01-24 DIAGNOSIS — Z13820 Encounter for screening for osteoporosis: Secondary | ICD-10-CM

## 2023-01-24 LAB — CBC WITH DIFFERENTIAL/PLATELET
Basophils Absolute: 0 10*3/uL (ref 0.0–0.1)
Basophils Relative: 0.3 % (ref 0.0–3.0)
Eosinophils Absolute: 0.2 10*3/uL (ref 0.0–0.7)
Eosinophils Relative: 3.1 % (ref 0.0–5.0)
HCT: 34.3 % — ABNORMAL LOW (ref 36.0–46.0)
Hemoglobin: 11.2 g/dL — ABNORMAL LOW (ref 12.0–15.0)
Lymphocytes Relative: 5.6 % — ABNORMAL LOW (ref 12.0–46.0)
Lymphs Abs: 0.3 10*3/uL — ABNORMAL LOW (ref 0.7–4.0)
MCHC: 32.6 g/dL (ref 30.0–36.0)
MCV: 84.4 fl (ref 78.0–100.0)
Monocytes Absolute: 0.4 10*3/uL (ref 0.1–1.0)
Monocytes Relative: 8.1 % (ref 3.0–12.0)
Neutro Abs: 4.3 10*3/uL (ref 1.4–7.7)
Neutrophils Relative %: 82.9 % — ABNORMAL HIGH (ref 43.0–77.0)
Platelets: 171 10*3/uL (ref 150.0–400.0)
RBC: 4.07 Mil/uL (ref 3.87–5.11)
RDW: 15.5 % (ref 11.5–15.5)
WBC: 5.2 10*3/uL (ref 4.0–10.5)

## 2023-01-24 LAB — LIPID PANEL
Cholesterol: 156 mg/dL (ref 0–200)
HDL: 46.2 mg/dL (ref >39.00)
LDL Cholesterol: 87 mg/dL (ref 0–99)
NonHDL: 109.31
Total CHOL/HDL Ratio: 3
Triglycerides: 112 mg/dL (ref 0.0–149.0)
VLDL: 22.4 mg/dL (ref 0.0–40.0)

## 2023-01-24 LAB — VITAMIN D 25 HYDROXY (VIT D DEFICIENCY, FRACTURES): VITD: 39.95 ng/mL (ref 30.00–100.00)

## 2023-01-24 LAB — COMPREHENSIVE METABOLIC PANEL
ALT: 9 U/L (ref 0–35)
AST: 12 U/L (ref 0–37)
Albumin: 3.5 g/dL (ref 3.5–5.2)
Alkaline Phosphatase: 82 U/L (ref 39–117)
BUN: 32 mg/dL — ABNORMAL HIGH (ref 6–23)
CO2: 32 mEq/L (ref 19–32)
Calcium: 9.1 mg/dL (ref 8.4–10.5)
Chloride: 103 mEq/L (ref 96–112)
Creatinine, Ser: 1.05 mg/dL (ref 0.40–1.20)
GFR: 52.91 mL/min — ABNORMAL LOW (ref >60.00)
Glucose, Bld: 179 mg/dL — ABNORMAL HIGH (ref 70–99)
Potassium: 4.4 mEq/L (ref 3.5–5.1)
Sodium: 140 mEq/L (ref 135–145)
Total Bilirubin: 0.5 mg/dL (ref 0.2–1.2)
Total Protein: 6.2 g/dL (ref 6.0–8.3)

## 2023-01-24 LAB — VITAMIN B12: Vitamin B-12: 175 pg/mL — ABNORMAL LOW (ref 211–911)

## 2023-01-24 LAB — BRAIN NATRIURETIC PEPTIDE: Pro B Natriuretic peptide (BNP): 163 pg/mL — ABNORMAL HIGH (ref 0.0–100.0)

## 2023-01-24 LAB — MICROALBUMIN / CREATININE URINE RATIO
Creatinine,U: 117.6 mg/dL
Microalb Creat Ratio: 10.4 mg/g (ref 0.0–30.0)
Microalb, Ur: 12.3 mg/dL — ABNORMAL HIGH (ref 0.0–1.9)

## 2023-01-24 LAB — TSH: TSH: 1.18 u[IU]/mL (ref 0.35–5.50)

## 2023-01-24 NOTE — Progress Notes (Unsigned)
SUBJECTIVE:   Chief Complaint  Patient presents with   Establish Care   HPI Presents to clinic to establish care  No acute concerns today  Recently left AMA from rehab in [place].  Reports Adult services was called and is now investigating.  Diabetes Type 2 Has had some low CBG at home, 56 but reports mainly between 90-120. Denies any chest pain, shortness of breath, nausea/vomiting, polyuria, polydipsia or polyphagia.  Currently takes Glipizide SR 10 mg daily, Levemir 25 units BID and Novolog 7 units TID with meals.  On statin.  Not on ACEi combo. Reports former PCP had tried to start different insulin but she is not sure what and dose.  Unclear why wanting to switch medication.  Hypertension Asymptomatic.  Takes Hyzaar 100-25 mg daily, Metoprolol XL 50 mg daily, Catapress 0.2 mg BID and Amlodipine 10 mg at night.  Follows with Cardiology, Dr Darrold Junker at [facility].  Exertional dyspnea Recent ECHO 09/26/22 shows LVEF >55% with mod MR and mild AS.  Follows with Cardiology.  Has been referred to [facility] Pulmonology and has upcoming appointment  Mood disorder Denies SI/HI.  Followed with psychiatry in past.  Now on Wellbutrin XL and Celexa daily. Tolerating medication well.   PERTINENT PMH / PSH: As above  OBJECTIVE:  BP (!) 134/58   Pulse (!) 52   Temp 97.7 F (36.5 C)   Resp 16   Ht 5\' 1"  (1.549 m)   Wt 191 lb 6 oz (86.8 kg)   SpO2 95%   BMI 36.16 kg/m    Physical Exam Vitals reviewed.  Constitutional:      General: She is not in acute distress.    Appearance: She is not ill-appearing.  HENT:     Head: Normocephalic.     Right Ear: Tympanic membrane, ear canal and external ear normal.     Left Ear: Tympanic membrane, ear canal and external ear normal.     Nose: Nose normal.     Mouth/Throat:     Mouth: Mucous membranes are moist.  Eyes:     Extraocular Movements: Extraocular movements intact.     Conjunctiva/sclera: Conjunctivae normal.     Pupils: Pupils are equal,  round, and reactive to light.  Neck:     Thyroid: No thyromegaly or thyroid tenderness.     Vascular: No carotid bruit.  Cardiovascular:     Rate and Rhythm: Normal rate and regular rhythm.     Pulses: Normal pulses.     Heart sounds: Normal heart sounds.  Pulmonary:     Effort: Pulmonary effort is normal.     Breath sounds: Normal breath sounds.  Abdominal:     General: Bowel sounds are normal. There is no distension.     Palpations: Abdomen is soft.     Tenderness: There is no abdominal tenderness. There is no right CVA tenderness, left CVA tenderness, guarding or rebound.  Musculoskeletal:        General: Normal range of motion.     Cervical back: Normal range of motion.     Right lower leg: No edema.     Left lower leg: No edema.  Lymphadenopathy:     Cervical: No cervical adenopathy.  Skin:    Capillary Refill: Capillary refill takes less than 2 seconds.  Neurological:     General: No focal deficit present.     Mental Status: She is alert and oriented to person, place, and time. Mental status is at baseline.  Motor: No weakness.  Psychiatric:        Mood and Affect: Mood normal.        Behavior: Behavior normal.        Thought Content: Thought content normal.        Judgment: Judgment normal.        01/24/2023    9:29 AM 05/30/2022    9:29 AM  Depression screen PHQ 2/9  Decreased Interest 0 0  Down, Depressed, Hopeless 0 0  PHQ - 2 Score 0 0  Altered sleeping 0   Tired, decreased energy 0   Change in appetite 0   Feeling bad or failure about yourself  0   Trouble concentrating 0   Moving slowly or fidgety/restless 0   Suicidal thoughts 0   PHQ-9 Score 0   Difficult doing work/chores Not difficult at all       01/24/2023    9:29 AM  GAD 7 : Generalized Anxiety Score  Nervous, Anxious, on Edge 0  Control/stop worrying 0  Worry too much - different things 0  Trouble relaxing 0  Restless 0  Easily annoyed or irritable 0  Afraid - awful might happen 0   Total GAD 7 Score 0  Anxiety Difficulty Not difficult at all    ASSESSMENT/PLAN:  Type 2 diabetes with complication [facility]) Assessment & Plan: Chronic Has had low CBG in past but asymptomatic Takes Novolog 7 units TID, Levemir 25 units BID, Glucotrol XL 10 mg daily  Would like to wean sulfonylurea and insulin if possible to avoid hypoglycemic events Recommend switching to GLP1 Refer to pharmacy for medication management and financial assistance On statin  BP controlled with current medications. Foot exam at next visit Referral sent for Eye exam Labs and UACR today   Orders: -     Microalbumin / creatinine urine ratio -     AMB Referral to Pharmacy Medication Management  Mood disorder [facility]) Assessment & Plan: Chronic Has seen psychiatry in past Takes Wellbutrin XL 300 mg daily and Celexa 20 mg daily Mental health resources provided Encouraged CBT   Centrilobular emphysema (HCC) Assessment & Plan: Has upcoming appointment with Pulmonology Currently has Albuterol prn    Hypertension associated with diabetes (HCC) Assessment & Plan: Chronic.  Well controlled. Diastolic low Takes Clonidine 0.2 mg BID, Hyzaar 100-25 mg daily, Amlodipine 10 mg daily and Metoprolol XL 50 mg daily Follows with Cardiology at [facility].  Orders: -     Comprehensive metabolic panel -     AMB Referral to Pharmacy Medication Management  Hyperlipidemia associated with type 2 diabetes mellitus (HCC) Assessment & Plan: Chronic On statin Continue Lipitor 80 mg daily Check fasting lipids  Orders: -     Lipid panel -     AMB Referral to Pharmacy Medication Management  Endometrial cancer [facility]) Assessment & Plan: Radiation 2023 Now on Ketruda IV q3weeks Follows with Oncology    Diabetic eye exam [facility]) -     Ambulatory referral to Ophthalmology  Screening for osteoporosis -     DG Bone Density; Future  Need for hepatitis C screening test -     Hepatitis C antibody  Vitamin B 12  deficiency -     Vitamin B12 -     Vitamin B-12; Take 1 tablet (1,000 mcg total) by mouth daily.  Dispense: 90 tablet; Refill: 3  Vitamin D deficiency -     VITAMIN D 25 Hydroxy (Vit-D Deficiency, Fractures)  Dyspnea on exertion Assessment & Plan:  Chronic Recent ECHO LVEF >55% Euvolemic on exam Follows with cardiology Has pulmonology appointment in near future History of tobacco use Will check labs today  Orders: -     CBC with Differential/Platelet -     Brain natriuretic peptide -     TSH  Iron deficiency -     Iron (Ferrous Sulfate); Take 325 mg by mouth every other day.  Dispense: 90 tablet; Refill: 3  Aortic atherosclerosis (HCC) Assessment & Plan: Chronic Noted on CT and 09/2022 Currently on statin   Claudication [facility]) Assessment & Plan: BP controlled On high intensity statin  Follows with Vascular    Foot Exam due  PDMP reviewed  Return in about 4 weeks (around 02/21/2023) for PCP.  Dana Allan, MD

## 2023-01-24 NOTE — Patient Instructions (Addendum)
It was a pleasure meeting you today. Thank you for allowing me to take part in your health care.  Our goals for today as we discussed include:  We will get some labs today.  If they are abnormal or we need to do something about them, I will call you.  If they are normal, I will send you a message on MyChart (if it is active) or a letter in the mail.  If you don't hear from [place] in 2 weeks, please call the office at the number below.   Follow up in 4 weeks  This is a list of the screening recommended for you and due dates:  Health Maintenance  Topic Date Due   COVID-19 Vaccine (1) Never done   Complete foot exam   Never done   Eye exam for diabetics  Never done   Yearly kidney health urinalysis for diabetes  Never done   Hepatitis C Screening  Never done   DTaP/Tdap/Td vaccine (1 - Tdap) Never done   Zoster (Shingles) Vaccine (1 of 2) Never done   DEXA scan (bone density measurement)  Never done   Pneumonia Vaccine (2 of 2 - PPSV23 or PCV20) 03/25/2017   Flu Shot  11/29/2022   Medicare Annual Wellness Visit  06/15/2023   Hemoglobin A1C  06/27/2023   Screening for Lung Cancer  10/08/2023   Yearly kidney function blood test for diabetes  01/16/2024   Mammogram  09/26/2024   Colon Cancer Screening  08/04/2025   HPV Vaccine  Aged Out      If you have any questions or concerns, please do not hesitate to call the office at [phone].  I look forward to our next visit and until then take care and stay safe.  Regards,   Dana Allan, MD   [facility] [place]

## 2023-01-25 LAB — HEPATITIS C ANTIBODY: Hepatitis C Ab: NONREACTIVE

## 2023-01-28 LAB — VAS US ABI WITH/WO TBI
Left ABI: 0.72
Right ABI: 0.82

## 2023-01-29 ENCOUNTER — Ambulatory Visit (INDEPENDENT_AMBULATORY_CARE_PROVIDER_SITE_OTHER): Payer: PPO | Admitting: Student in an Organized Health Care Education/Training Program

## 2023-01-29 ENCOUNTER — Inpatient Hospital Stay: Payer: PPO | Attending: Obstetrics and Gynecology

## 2023-01-29 ENCOUNTER — Encounter: Payer: Self-pay | Admitting: Student in an Organized Health Care Education/Training Program

## 2023-01-29 VITALS — BP 132/70 | HR 61 | Temp 97.8°F | Ht 61.0 in | Wt 190.0 lb

## 2023-01-29 DIAGNOSIS — N950 Postmenopausal bleeding: Secondary | ICD-10-CM | POA: Insufficient documentation

## 2023-01-29 DIAGNOSIS — C7982 Secondary malignant neoplasm of genital organs: Secondary | ICD-10-CM | POA: Insufficient documentation

## 2023-01-29 DIAGNOSIS — Z923 Personal history of irradiation: Secondary | ICD-10-CM | POA: Insufficient documentation

## 2023-01-29 DIAGNOSIS — R0602 Shortness of breath: Secondary | ICD-10-CM | POA: Diagnosis not present

## 2023-01-29 DIAGNOSIS — C541 Malignant neoplasm of endometrium: Secondary | ICD-10-CM | POA: Insufficient documentation

## 2023-01-29 DIAGNOSIS — Z5112 Encounter for antineoplastic immunotherapy: Secondary | ICD-10-CM | POA: Insufficient documentation

## 2023-01-29 DIAGNOSIS — D649 Anemia, unspecified: Secondary | ICD-10-CM | POA: Insufficient documentation

## 2023-01-29 DIAGNOSIS — E1165 Type 2 diabetes mellitus with hyperglycemia: Secondary | ICD-10-CM | POA: Insufficient documentation

## 2023-01-29 DIAGNOSIS — D696 Thrombocytopenia, unspecified: Secondary | ICD-10-CM | POA: Insufficient documentation

## 2023-01-29 NOTE — Progress Notes (Unsigned)
Synopsis: Referred in for shortness of breath by Marguarite Arbour, MD  Assessment & Plan:   1. Shortness of breath  Patient is presenting for the evaluation of shortness of breath in the setting of report of asthma as well as significant smoking history.  She was previously maintained on maintenance inhalers but is no longer so.  Her symptoms have mostly resolved and were episodic in July, the differential for this includes an asthma exacerbation versus heart failure versus viral illness.  For workup, I will obtain a pulmonary function test to assess her spirometry, lung volumes, and DLCO.  Should there be a component of reversibility, I will consider initiating her on standing long acting beta agonist and inhaled corticosteroids.  Furthermore, she is maintained on pembrolizumab and checkpoint inhibitor toxicity is certainly on the differential.  I have reviewed her CT scans of the chest which do not show signs of pneumonitis but I did note some groundglass thing which appears to be stable on multiple repeat images.  On follow-up, we will reevaluate her symptoms as well as her pulmonary function and decide on repeat imaging at that point.  - Pulmonary Function Test [facility] Only; Future   Return in about 3 months (around 05/01/2023).  I spent 60 minutes caring for this patient today, including preparing to see the patient, obtaining a medical history , reviewing a separately obtained history, performing a medically appropriate examination and/or evaluation, counseling and educating the patient/family/caregiver, ordering medications, tests, or procedures, and documenting clinical information in the electronic health record  [name], MD [facility] 01/29/2023 11:15 AM    End of visit medications:  No orders of the defined types were placed in this encounter.    Current Outpatient Medications:    albuterol (PROVENTIL HFA;VENTOLIN HFA) 108 (90 Base) MCG/ACT inhaler,  Inhale 2 puffs into the lungs every 6 (six) hours as needed for wheezing or shortness of breath., Disp: , Rfl:    amLODipine (NORVASC) 10 MG tablet, Take 10 mg by mouth daily., Disp: , Rfl:    atorvastatin (LIPITOR) 80 MG tablet, Take 80 mg by mouth daily., Disp: , Rfl:    buPROPion (WELLBUTRIN XL) 300 MG 24 hr tablet, Take 300 mg by mouth daily., Disp: , Rfl:    Cholecalciferol (VITAMIN D3) 50 MCG (2000 UT) capsule, Take 2,000 Units by mouth daily., Disp: , Rfl:    citalopram (CELEXA) 20 MG tablet, Take 20 mg by mouth daily., Disp: , Rfl:    cloNIDine (CATAPRES) 0.2 MG tablet, Take 0.2 mg by mouth 2 (two) times daily., Disp: , Rfl:    glipiZIDE (GLUCOTROL XL) 10 MG 24 hr tablet, Take 1 tablet (10 mg total) by mouth daily. Pt dropped bottle and is aware that insurance may not pay yet. May need cash pricing or coupon. Thank you., Disp: 30 tablet, Rfl: 0   insulin aspart (NOVOLOG) 100 UNIT/ML injection, Inject 13 Units into the skin 3 (three) times daily before meals., Disp: , Rfl:    Insulin Degludec 100 UNIT/ML SOLN, Inject into the skin., Disp: , Rfl:    insulin detemir (LEVEMIR FLEXPEN) 100 UNIT/ML FlexPen, Inject 25 units in the morning. Inject 23 units in the evening., Disp: , Rfl:    losartan-hydrochlorothiazide (HYZAAR) 100-25 MG tablet, Take 1 tablet by mouth daily., Disp: , Rfl:    metoprolol succinate (TOPROL-XL) 50 MG 24 hr tablet, Take 1 tablet by mouth daily., Disp: , Rfl:    montelukast (SINGULAIR) 10 MG tablet, Take  10 mg by mouth at bedtime., Disp: , Rfl:    ONETOUCH VERIO test strip, 1 each 3 (three) times daily., Disp: , Rfl:    Subjective:   PATIENT ID: Terri [name] GENDER: female DOB: 1951/02/08, MRN: [id number]  Chief Complaint  Patient presents with   pulmonary consult    Breathing has improved since referral was placed- mild SOB with exertion and occ dry cough.     HPI  Patient is a pleasant 72 year old female with a past medical history of endometrial cancer currently  on Keytruda presenting to clinic for the evaluation of shortness of breath.  Patient's symptoms started in July at which point she experienced sudden onset shortness of breath.  This was associated with a cough and wheezing.  She was unable to go up a flight of stairs without having to catch her breath.  She was seen by her primary care physician and was referred to pulmonology for further workup and management.  Since then, the symptoms have self resolved and she is now back in her usual state of health.  She has minimal symptoms and denies any shortness of breath, cough, or wheezing.  She report that she has a nebulizer that she used at the time of her symptoms with some improvement.  She is not currently on any inhalers.  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 Orlie Dakin. She has completed chemoradiation and is currently on Pembrolizumab maintenance, to complete at least 2 years. Patient also reports a history of asthma that was diagnosed in [place].  At that time, she was a heavy smoker.  She was previously maintained on Advair but is no longer so.  She is currently maintained on montelukast, DuoNebs, and as needed albuterol.  Patient is originally from [place].  She has a history of smoking, having smoked for at least 40 years, 3 packs at her height, but mostly 1 pack a day.  She has between 40 and 60 pack years of smoking history on her.  She quit in 2015.  Ancillary information including prior medications, full medical/surgical/family/social histories, and PFTs (when available) are listed below and have been reviewed.   Review of Systems  Constitutional:  Negative for chills, fever, malaise/fatigue and weight loss.  Respiratory:  Negative for cough, hemoptysis, sputum production, shortness of breath and wheezing.   Cardiovascular:  Negative for chest pain  and PND.     Objective:   Vitals:   01/29/23 1049  BP: 132/70  Pulse: 61  Temp: 97.8 F (36.6 C)  TempSrc: Oral  SpO2: 96%  Weight: 190 lb (86.2 kg)  Height: 5\' 1"  (1.549 m)   96% on RA  BMI Readings from Last 3 Encounters:  01/29/23 35.90 kg/m  01/24/23 36.16 kg/m  01/18/23 36.69 kg/m   Wt Readings from Last 3 Encounters:  01/29/23 190 lb (86.2 kg)  01/24/23 191 lb 6 oz (86.8 kg)  01/18/23 194 lb 3.2 oz (88.1 kg)    Physical Exam Constitutional:      Appearance: She is obese. She is not ill-appearing.  Cardiovascular:     Rate and Rhythm: Normal rate and regular rhythm.     Pulses: Normal pulses.     Heart sounds: Normal heart sounds.  Pulmonary:     Effort: Pulmonary effort is normal. No respiratory distress.     Breath sounds: Normal breath sounds.  No wheezing or rales.  Abdominal:     Palpations: Abdomen is soft.  Neurological:     General: No focal deficit present.     Mental Status: She is alert and oriented to person, place, and time. Mental status is at baseline.       Ancillary Information    Past Medical History:  Diagnosis Date   Adenomatous colon polyp    Aortic stenosis    Arthritis    Asthma    Claudication (HCC)    COPD (chronic obstructive pulmonary disease) (HCC)    Depression    Diabetes mellitus without complication (HCC)    Esophageal dysphagia    GERD (gastroesophageal reflux disease)    Hyperlipemia    Hypertension    Obesity    Severe obesity (BMI 35.0-35.9 with comorbidity) (HCC)      Family History  Problem Relation Age of Onset   CVA Mother    Diabetes Mother    CAD Father    Breast cancer Neg Hx      Past Surgical History:  Procedure Laterality Date   BLADDER SURGERY     COLONOSCOPY WITH PROPOFOL N/A 08/05/2015   Procedure: COLONOSCOPY WITH PROPOFOL;  Surgeon: Elnita Maxwell, MD;  Location: [facility] ENDOSCOPY;  Service: Endoscopy;  Laterality: N/A;   ESOPHAGOGASTRODUODENOSCOPY N/A 01/09/2022    Procedure: ESOPHAGOGASTRODUODENOSCOPY (EGD);  Surgeon: Regis Bill, MD;  Location[facility] ENDOSCOPY;  Service: Endoscopy;  Laterality: N/A;   FOOT SURGERY Left    FRACTURE SURGERY     IR CV LINE INJECTION  06/22/2022   IR IMAGING GUIDED PORT INSERTION  06/05/2022   IR PORT REPAIR CENTRAL VENOUS ACCESS DEVICE  07/02/2022   ORIF ANKLE FRACTURE Left 03/26/2017   Procedure: OPEN REDUCTION INTERNAL FIXATION (ORIF) ANKLE FRACTURE;  Surgeon: Christena Flake, MD;  Location: [facility] ORS;  Service: Orthopedics;  Laterality: Left;    Social History   Socioeconomic History   Marital status: Single    Spouse name: Not on file   Number of children: Not on file   Years of education: Not on file   Highest education level: Not on file  Occupational History   Not on file  Tobacco Use   Smoking status: Former    Current packs/day: 1.00    Average packs/day: 1 pack/day for 40.0 years (40.0 ttl pk-yrs)    Types: Cigarettes   Smokeless tobacco: Never   Tobacco comments:    Quite 2015  Vaping Use   Vaping status: Never Used  Substance and Sexual Activity   Alcohol use: No   Drug use: No   Sexual activity: Not on file  Other Topics Concern   Not on file  Social History Narrative   Not on file   Social Determinants of Health   Financial Resource Strain: Low Risk  (12/20/2022)   Received from [facility]   Overall Financial Resource Strain (CARDIA)    Difficulty of Paying Living Expenses: Not very hard  Food Insecurity: No Food Insecurity (12/20/2022)   Received from [facility]   Hunger Vital Sign    Worried About Running Out of Food in the Last Year: Never true    Ran Out of Food in the Last Year: Never true  Transportation Needs: Unknown (12/20/2022)   Received from [facility] - Transportation    In the past 12 months, has lack of transportation kept you from medical appointments or from getting medications?: No  Lack of  Transportation (Non-Medical): Not on file  Physical Activity: Inactive (09/20/2022)   Exercise Vital Sign    Days of Exercise per Week: 0 days    Minutes of Exercise per Session: 0 min  Stress: Stress Concern Present (09/20/2022)   [employer] of Occupational Health - Occupational Stress Questionnaire    Feeling of Stress : To some extent  Social Connections: Socially Isolated (09/20/2022)   Social Connection and Isolation Panel [NHANES]    Frequency of Communication with Friends and Family: Once a week    Frequency of Social Gatherings with Friends and Family: Never    Attends Religious Services: Never    Database administrator or Organizations: No    Attends Engineer, structural: Never    Marital Status: Never married  Intimate Partner Violence: Not At Risk (05/30/2022)   Humiliation, Afraid, Rape, and Kick questionnaire    Fear of Current or Ex-Partner: No    Emotionally Abused: No    Physically Abused: No    Sexually Abused: No     No Known Allergies   CBC    Component Value Date/Time   WBC 5.2 01/24/2023 1019   RBC 4.07 01/24/2023 1019   HGB 11.2 (L) 01/24/2023 1019   HGB 10.3 (L) 01/16/2023 0934   HCT 34.3 (L) 01/24/2023 1019   PLT 171.0 01/24/2023 1019   PLT 121 (L) 01/16/2023 0934   MCV 84.4 01/24/2023 1019   MCH 27.9 01/16/2023 0934   MCHC 32.6 01/24/2023 1019   RDW 15.5 01/24/2023 1019   LYMPHSABS 0.3 (L) 01/24/2023 1019   MONOABS 0.4 01/24/2023 1019   EOSABS 0.2 01/24/2023 1019   BASOSABS 0.0 01/24/2023 1019    Pulmonary Functions Testing Results:     No data to display          Outpatient Medications Prior to Visit  Medication Sig Dispense Refill   albuterol (PROVENTIL HFA;VENTOLIN HFA) 108 (90 Base) MCG/ACT inhaler Inhale 2 puffs into the lungs every 6 (six) hours as needed for wheezing or shortness of breath.     amLODipine (NORVASC) 10 MG tablet Take 10 mg by mouth daily.     atorvastatin (LIPITOR) 80 MG tablet Take 80 mg by mouth  daily.     buPROPion (WELLBUTRIN XL) 300 MG 24 hr tablet Take 300 mg by mouth daily.     Cholecalciferol (VITAMIN D3) 50 MCG (2000 UT) capsule Take 2,000 Units by mouth daily.     citalopram (CELEXA) 20 MG tablet Take 20 mg by mouth daily.     cloNIDine (CATAPRES) 0.2 MG tablet Take 0.2 mg by mouth 2 (two) times daily.     glipiZIDE (GLUCOTROL XL) 10 MG 24 hr tablet Take 1 tablet (10 mg total) by mouth daily. Pt dropped bottle and is aware that insurance may not pay yet. May need cash pricing or coupon. Thank you. 30 tablet 0   insulin aspart (NOVOLOG) 100 UNIT/ML injection Inject 13 Units into the skin 3 (three) times daily before meals.     Insulin Degludec 100 UNIT/ML SOLN Inject into the skin.     insulin detemir (LEVEMIR FLEXPEN) 100 UNIT/ML FlexPen Inject 25 units in the morning. Inject 23 units in the evening.     losartan-hydrochlorothiazide (HYZAAR) 100-25 MG tablet Take 1 tablet by mouth daily.     metoprolol succinate (TOPROL-XL) 50 MG 24 hr tablet Take 1 tablet by mouth daily.     montelukast (SINGULAIR) 10 MG tablet Take 10 mg by  mouth at bedtime.     ONETOUCH VERIO test strip 1 each 3 (three) times daily.     No facility-administered medications prior to visit.

## 2023-02-02 ENCOUNTER — Encounter: Payer: Self-pay | Admitting: Family Medicine

## 2023-02-02 ENCOUNTER — Other Ambulatory Visit: Payer: Self-pay

## 2023-02-02 DIAGNOSIS — R0609 Other forms of dyspnea: Secondary | ICD-10-CM | POA: Insufficient documentation

## 2023-02-02 DIAGNOSIS — E119 Type 2 diabetes mellitus without complications: Secondary | ICD-10-CM | POA: Insufficient documentation

## 2023-02-02 DIAGNOSIS — Z23 Encounter for immunization: Secondary | ICD-10-CM | POA: Insufficient documentation

## 2023-02-02 DIAGNOSIS — I700 Atherosclerosis of aorta: Secondary | ICD-10-CM | POA: Insufficient documentation

## 2023-02-02 DIAGNOSIS — Z1159 Encounter for screening for other viral diseases: Secondary | ICD-10-CM | POA: Insufficient documentation

## 2023-02-02 DIAGNOSIS — E538 Deficiency of other specified B group vitamins: Secondary | ICD-10-CM | POA: Insufficient documentation

## 2023-02-02 DIAGNOSIS — E559 Vitamin D deficiency, unspecified: Secondary | ICD-10-CM | POA: Insufficient documentation

## 2023-02-02 DIAGNOSIS — Z13820 Encounter for screening for osteoporosis: Secondary | ICD-10-CM | POA: Insufficient documentation

## 2023-02-02 MED ORDER — VITAMIN B-12 1000 MCG PO TABS
1000.0000 ug | ORAL_TABLET | Freq: Every day | ORAL | 3 refills | Status: AC
Start: 2023-02-02 — End: ?

## 2023-02-02 MED ORDER — IRON (FERROUS SULFATE) 325 (65 FE) MG PO TABS
325.0000 mg | ORAL_TABLET | ORAL | 3 refills | Status: DC
Start: 2023-02-02 — End: 2023-11-04

## 2023-02-02 NOTE — Assessment & Plan Note (Signed)
Has upcoming appointment with Pulmonology Currently has Albuterol prn

## 2023-02-02 NOTE — Assessment & Plan Note (Signed)
BP controlled On high intensity statin  Follows with Vascular

## 2023-02-02 NOTE — Assessment & Plan Note (Signed)
Chronic Noted on CT and 09/2022 Currently on statin

## 2023-02-02 NOTE — Assessment & Plan Note (Addendum)
Chronic Recent ECHO LVEF >55% Euvolemic on exam Follows with cardiology Has pulmonology appointment in near future History of tobacco use Will check labs today

## 2023-02-02 NOTE — Assessment & Plan Note (Signed)
Chronic Has had low CBG in past but asymptomatic Takes Novolog 7 units TID, Levemir 25 units BID, Glucotrol XL 10 mg daily  Would like to wean sulfonylurea and insulin if possible to avoid hypoglycemic events Recommend switching to GLP1 Refer to pharmacy for medication management and financial assistance On statin  BP controlled with current medications. Foot exam at next visit Referral sent for Eye exam Labs and UACR today

## 2023-02-02 NOTE — Assessment & Plan Note (Signed)
Radiation 2023 Now on Ketruda IV q3weeks Follows with Oncology

## 2023-02-02 NOTE — Assessment & Plan Note (Signed)
Chronic.  Well controlled. Diastolic low Takes Clonidine 0.2 mg BID, Hyzaar 100-25 mg daily, Amlodipine 10 mg daily and Metoprolol XL 50 mg daily Follows with Cardiology at [facility].

## 2023-02-02 NOTE — Assessment & Plan Note (Signed)
Chronic On statin Continue Lipitor 80 mg daily Check fasting lipids

## 2023-02-02 NOTE — Assessment & Plan Note (Signed)
Chronic Has seen psychiatry in past Takes Wellbutrin XL 300 mg daily and Celexa 20 mg daily Mental health resources provided Encouraged CBT

## 2023-02-04 ENCOUNTER — Ambulatory Visit
Admission: RE | Admit: 2023-02-04 | Discharge: 2023-02-04 | Disposition: A | Discharge status: Home / Self Care | Payer: PPO | Attending: Vascular Surgery | Admitting: Vascular Surgery

## 2023-02-04 ENCOUNTER — Other Ambulatory Visit: Payer: Self-pay

## 2023-02-04 ENCOUNTER — Encounter
Admission: RE | Disposition: A | Discharge status: Home / Self Care | Payer: Self-pay | Source: Home / Self Care | Attending: Vascular Surgery

## 2023-02-04 ENCOUNTER — Encounter: Payer: Self-pay | Admitting: Vascular Surgery

## 2023-02-04 ENCOUNTER — Encounter: Payer: Self-pay | Admitting: *Deleted

## 2023-02-04 ENCOUNTER — Telehealth: Payer: Self-pay

## 2023-02-04 DIAGNOSIS — Z833 Family history of diabetes mellitus: Secondary | ICD-10-CM | POA: Insufficient documentation

## 2023-02-04 DIAGNOSIS — I10 Essential (primary) hypertension: Secondary | ICD-10-CM | POA: Diagnosis not present

## 2023-02-04 DIAGNOSIS — I70213 Atherosclerosis of native arteries of extremities with intermittent claudication, bilateral legs: Secondary | ICD-10-CM

## 2023-02-04 DIAGNOSIS — E1151 Type 2 diabetes mellitus with diabetic peripheral angiopathy without gangrene: Secondary | ICD-10-CM | POA: Insufficient documentation

## 2023-02-04 DIAGNOSIS — Z7984 Long term (current) use of oral hypoglycemic drugs: Secondary | ICD-10-CM | POA: Diagnosis not present

## 2023-02-04 DIAGNOSIS — Z794 Long term (current) use of insulin: Secondary | ICD-10-CM | POA: Diagnosis not present

## 2023-02-04 DIAGNOSIS — I70219 Atherosclerosis of native arteries of extremities with intermittent claudication, unspecified extremity: Secondary | ICD-10-CM

## 2023-02-04 DIAGNOSIS — I708 Atherosclerosis of other arteries: Secondary | ICD-10-CM

## 2023-02-04 DIAGNOSIS — Z87891 Personal history of nicotine dependence: Secondary | ICD-10-CM | POA: Diagnosis not present

## 2023-02-04 DIAGNOSIS — Z8249 Family history of ischemic heart disease and other diseases of the circulatory system: Secondary | ICD-10-CM | POA: Insufficient documentation

## 2023-02-04 HISTORY — PX: LOWER EXTREMITY ANGIOGRAPHY: CATH118251

## 2023-02-04 LAB — BUN: BUN: 22 mg/dL (ref 8–23)

## 2023-02-04 LAB — CREATININE, SERUM
Creatinine, Ser: 0.9 mg/dL (ref 0.44–1.00)
GFR, Estimated: >60 mL/min (ref >60)

## 2023-02-04 LAB — GLUCOSE, CAPILLARY: Glucose-Capillary: 129 mg/dL — ABNORMAL HIGH (ref 70–99)

## 2023-02-04 SURGERY — LOWER EXTREMITY ANGIOGRAPHY
Anesthesia: Moderate Sedation | Site: Leg Lower | Laterality: Left

## 2023-02-04 MED ORDER — CLOPIDOGREL BISULFATE 75 MG PO TABS: 75.0000 mg | ORAL_TABLET | Freq: Every day | ORAL | Status: DC

## 2023-02-04 MED ORDER — ACETAMINOPHEN 325 MG PO TABS: 650.0000 mg | ORAL_TABLET | ORAL | Status: DC | PRN

## 2023-02-04 MED ORDER — HYDRALAZINE HCL 20 MG/ML IJ SOLN: 5.0000 mg | INTRAMUSCULAR | Status: DC | PRN

## 2023-02-04 MED ORDER — SODIUM CHLORIDE 0.9 % IV SOLN: INTRAVENOUS | Status: DC

## 2023-02-04 MED ORDER — HYDROMORPHONE HCL 1 MG/ML IJ SOLN: 1.0000 mg | Freq: Once | INTRAMUSCULAR | Status: DC | PRN

## 2023-02-04 MED ORDER — ONDANSETRON HCL 4 MG/2ML IJ SOLN: 4.0000 mg | Freq: Four times a day (QID) | INTRAMUSCULAR | Status: DC | PRN

## 2023-02-04 MED ORDER — SODIUM CHLORIDE 0.9% FLUSH: 3.0000 mL | INTRAVENOUS | Status: DC | PRN

## 2023-02-04 MED ORDER — CEFAZOLIN SODIUM-DEXTROSE 2-4 GM/100ML-% IV SOLN
2.0000 g | INTRAVENOUS | Status: AC
  Administered 2023-02-04: 2 g via INTRAVENOUS

## 2023-02-04 MED ORDER — HEPARIN SODIUM (PORCINE) 1000 UNIT/ML IJ SOLN
INTRAMUSCULAR | Status: DC | PRN
  Administered 2023-02-04: 5000 [IU] via INTRAVENOUS

## 2023-02-04 MED ORDER — SODIUM CHLORIDE 0.9 % IV SOLN: 250.0000 mL | INTRAVENOUS | Status: DC | PRN

## 2023-02-04 MED ORDER — FAMOTIDINE 20 MG PO TABS: 40.0000 mg | ORAL_TABLET | Freq: Once | ORAL | Status: DC | PRN

## 2023-02-04 MED ORDER — IODIXANOL 320 MG/ML IV SOLN
INTRAVENOUS | Status: DC | PRN
  Administered 2023-02-04: 70 mL

## 2023-02-04 MED ORDER — LIDOCAINE-EPINEPHRINE (PF) 1 %-1:200000 IJ SOLN
INTRAMUSCULAR | Status: DC | PRN
  Administered 2023-02-04: 10 mL

## 2023-02-04 MED ORDER — SODIUM CHLORIDE 0.9% FLUSH: 3.0000 mL | Freq: Two times a day (BID) | INTRAVENOUS | Status: DC

## 2023-02-04 MED ORDER — CEFAZOLIN SODIUM-DEXTROSE 2-4 GM/100ML-% IV SOLN
INTRAVENOUS | Status: AC
  Filled 2023-02-04: qty 100

## 2023-02-04 MED ORDER — ASPIRIN 81 MG PO TBEC
DELAYED_RELEASE_TABLET | ORAL | Status: AC
  Filled 2023-02-04: qty 1

## 2023-02-04 MED ORDER — DIPHENHYDRAMINE HCL 50 MG/ML IJ SOLN: 50.0000 mg | Freq: Once | INTRAMUSCULAR | Status: DC | PRN

## 2023-02-04 MED ORDER — CLOPIDOGREL BISULFATE 75 MG PO TABS: 75.0000 mg | ORAL_TABLET | Freq: Every day | ORAL | 11 refills | Status: DC

## 2023-02-04 MED ORDER — FENTANYL CITRATE (PF) 100 MCG/2ML IJ SOLN
INTRAMUSCULAR | Status: DC | PRN
  Administered 2023-02-04: 25 ug via INTRAVENOUS
  Administered 2023-02-04: 50 ug via INTRAVENOUS

## 2023-02-04 MED ORDER — ASPIRIN 81 MG PO TBEC
81.0000 mg | DELAYED_RELEASE_TABLET | Freq: Every day | ORAL | 2 refills | Status: AC
Start: 2023-02-04 — End: 2024-02-04

## 2023-02-04 MED ORDER — HEPARIN SODIUM (PORCINE) 1000 UNIT/ML IJ SOLN
INTRAMUSCULAR | Status: AC
  Filled 2023-02-04: qty 10

## 2023-02-04 MED ORDER — ASPIRIN 81 MG PO TBEC
81.0000 mg | DELAYED_RELEASE_TABLET | Freq: Every day | ORAL | Status: DC
  Administered 2023-02-04: 81 mg via ORAL

## 2023-02-04 MED ORDER — MIDAZOLAM HCL 5 MG/5ML IJ SOLN
INTRAMUSCULAR | Status: AC
  Filled 2023-02-04: qty 5

## 2023-02-04 MED ORDER — FENTANYL CITRATE (PF) 100 MCG/2ML IJ SOLN
INTRAMUSCULAR | Status: AC
  Filled 2023-02-04: qty 2

## 2023-02-04 MED ORDER — MIDAZOLAM HCL 2 MG/ML PO SYRP: 8.0000 mg | ORAL_SOLUTION | Freq: Once | ORAL | Status: DC | PRN

## 2023-02-04 MED ORDER — METHYLPREDNISOLONE SODIUM SUCC 125 MG IJ SOLR: 125.0000 mg | Freq: Once | INTRAMUSCULAR | Status: DC | PRN

## 2023-02-04 MED ORDER — LABETALOL HCL 5 MG/ML IV SOLN: 10.0000 mg | INTRAVENOUS | Status: DC | PRN

## 2023-02-04 MED ORDER — MIDAZOLAM HCL 2 MG/2ML IJ SOLN
INTRAMUSCULAR | Status: DC | PRN
  Administered 2023-02-04 (×2): 1 mg via INTRAVENOUS

## 2023-02-04 MED ORDER — HEPARIN (PORCINE) IN NACL 1000-0.9 UT/500ML-% IV SOLN
INTRAVENOUS | Status: DC | PRN
  Administered 2023-02-04: 1000 mL

## 2023-02-04 SURGICAL SUPPLY — 20 items
BALLN LUTONIX 5X150X130 (BALLOONS) ×2
BALLN LUTONIX DCB 6X60X130 (BALLOONS) ×1
BALLOON LUTONIX 5X150X130 (BALLOONS) IMPLANT
BALLOON LUTONIX DCB 6X60X130 (BALLOONS) IMPLANT
CATH ANGIO 5F PIGTAIL 65CM (CATHETERS) IMPLANT
CATH BEACON 5 .038 100 VERT TP (CATHETERS) IMPLANT
COVER PROBE ULTRASOUND 5X96 (MISCELLANEOUS) IMPLANT
DEVICE STARCLOSE SE CLOSURE (Vascular Products) IMPLANT
GLIDEWIRE ADV .035X260CM (WIRE) IMPLANT
INTRODUCER 7FR 23CM (INTRODUCER) IMPLANT
KIT ENCORE 26 ADVANTAGE (KITS) IMPLANT
LIFESTENT 6X120X130 (Permanent Stent) IMPLANT
PACK ANGIOGRAPHY (CUSTOM PROCEDURE TRAY) ×1 IMPLANT
PANNUS RETENTION SYSTEM 2 PAD (MISCELLANEOUS) IMPLANT
SHEATH ANL2 6FRX45 HC (SHEATH) IMPLANT
SHEATH BRITE TIP 5FRX11 (SHEATH) IMPLANT
STENT LIFESTREAM 9X38X80 (Permanent Stent) IMPLANT
SYR MEDRAD MARK 7 150ML (SYRINGE) IMPLANT
TUBING CONTRAST HIGH PRESS 72 (TUBING) IMPLANT
WIRE GUIDERIGHT .035X150 (WIRE) IMPLANT

## 2023-02-04 NOTE — Progress Notes (Signed)
Care Guide Note  02/04/2023 Name: Terri [name] MRN: [id number] DOB: 27-Aug-1950  Referred by: Dana Allan, MD Reason for referral : Care Coordination (Outreach to schedule with Pharm d )   Terri [name] is a 72 y.o. year old female who is a primary care patient of Dana Allan, MD. Terri [name] was referred to the pharmacist for assistance related to HTN and DM.    Successful contact was made with the patient to discuss pharmacy services including being ready for the pharmacist to call at least 5 minutes before the scheduled appointment time, to have medication bottles and any blood sugar or blood pressure readings ready for review. The patient agreed to meet with the pharmacist via with the pharmacist via telephone visit on (date/time).  02/18/2023  Penne Lash, RMA Care Guide [facility]  [place], [place] [address] Direct Dial: [phone] [email or web address]

## 2023-02-04 NOTE — Interval H&P Note (Signed)
History and Physical Interval Note:  02/04/2023 8:11 AM  Terri [name]  has presented today for surgery, with the diagnosis of LLE Angio    ASO w claudication.  The various methods of treatment have been discussed with the patient and family. After consideration of risks, benefits and other options for treatment, the patient has consented to  Procedure(s): Lower Extremity Angiography (Left) as a surgical intervention.  The patient's history has been reviewed, patient examined, no change in status, stable for surgery.  I have reviewed the patient's chart and labs.  Questions were answered to the patient's satisfaction.     Festus Barren

## 2023-02-04 NOTE — Op Note (Signed)
[facility]  Percutaneous Study/Intervention Procedural Note   Date of Surgery: 02/04/2023  Surgeon(s):Ammi Hutt    Assistants:none  Pre-operative Diagnosis: PAD with claudication BLE  Post-operative diagnosis:  Same  Procedure(s) Performed:             1.  Ultrasound guidance for vascular access right femoral artery             2.  Catheter placement into left common femoral artery from right femoral approach             3.  Aortogram and selective left lower extremity angiogram             4.  Percutaneous transluminal angioplasty of left superficial femoral artery with 5 mm diameter by 15 cm length Lutonix drug-coated angioplasty balloon             5.  Stent placement to the left superficial femoral artery with 6 mm diameter by 12 cm length life stent  6.  Percutaneous transluminal angioplasty of left external iliac artery and proximal common femoral artery with 6 mm diameter by 6 cm length Lutonix drug-coated angioplasty  7.  Stent placement to the right common iliac artery with 9 mm diameter by 38 mm length Lifestream stent            8.  StarClose closure device right femoral artery  EBL: 10 cc  Contrast: 70 cc  Fluoro Time: 4.4 minutes  Moderate Conscious Sedation Time: approximately 55 minutes using 2 mg of Versed and 75 mcg of Fentanyl              Indications:  Patient is a 72 y.o.female with disabling claudication symptoms of both lower extremities worse on the left than the right. The patient has noninvasive study showing reduced ABIs bilaterally. The patient is brought in for angiography for further evaluation and potential treatment.  Risks and benefits are discussed and informed consent is obtained.   Procedure:  The patient was identified and appropriate procedural time out was performed.  The patient was then placed supine on the table and prepped and draped in the usual sterile fashion. Moderate conscious sedation was administered during a  face to face encounter with the patient throughout the procedure with my supervision of the RN administering medicines and monitoring the patient's vital signs, pulse oximetry, telemetry and mental status throughout from the start of the procedure until the patient was taken to the recovery room. Ultrasound was used to evaluate the right common femoral artery.  It was patent .  A digital ultrasound image was acquired.  A Seldinger needle was used to access the right common femoral artery under direct ultrasound guidance and a permanent image was performed.  A 0.035 J wire was advanced without resistance and a 5Fr sheath was placed.  Pigtail catheter was placed into the aorta and an AP aortogram was performed. This demonstrated normal renal arteries and normal aorta.  The right common iliac artery had about a 75 to 80% stenosis in its mid segment.  The right external iliac artery was fairly normal.  The left common iliac artery was minimally diseased but the distal left external iliac artery and most proximal common femoral artery at the top of the femoral head had about an 80 to 90% stenosis. I then crossed the aortic bifurcation and advanced to the left femoral head. Selective left lower extremity angiogram was then performed. This demonstrated fairly normal profunda femoris artery.  The proximal  common femoral artery had disease tracking down from the distal external iliac artery as described above.  The mid to distal left SFA had a high-grade stenosis in the 80 to 90% range.  The popliteal artery normalized.  There was a typical tibial trifurcation with three-vessel runoff distally. It was felt that it was in the patient's best interest to proceed with intervention after these images to avoid a second procedure and a larger amount of contrast and fluoroscopy based off of the findings from the initial angiogram. The patient was systemically heparinized and a 6 [place] [employer] sheath was then placed over the [employer] wire. I then used a Kumpe catheter and the advantage wire to navigate through the SFA stenosis and confirm intraluminal flow in the popliteal artery.  The advantage wire was then replaced.  A 5 mm diameter by 15 cm length Lutonix drug-coated angioplasty balloon was inflated to 8 atm for 1 minute in the mid to distal left SFA.  Completion imaging showed greater than 50% residual stenosis after angioplasty so I elected to place a stent.  A 6 mm diameter by 12 cm length life stent was then deployed in the mid to distal left SFA and postdilated with a 5 mm balloon with excellent angiographic completion result and less than 10% residual stenosis.  I then turned my attention to the left external iliac artery and proximal common femoral artery lesion.  A 6 mm diameter by 6 cm length Lutonix drug-coated angioplasty balloon was inflated to 8 atm for 1 minute.  Completion imaging showed a borderline residual stenosis in the 40 to 45% range.  This was a suboptimal location for stent going across the inguinal ligament so I elected to leave this for now and if she still has residual symptoms or significant disease this may be best treated with an endarterectomy in the future.  I then upsized to a 7 French sheath to address the right common iliac artery lesion.  A 9 mm diameter by 38 mm length Lifestream stent was then deployed in the right common iliac artery taking care to stay below the aortic bifurcation and above the right hypogastric artery origin.  This was inflated to 10 atm and completion imaging showed less than 10% residual stenosis. I elected to terminate the procedure. The sheath was removed and StarClose closure device was deployed in the right femoral artery with excellent hemostatic result. The patient was taken to the recovery room in stable condition having tolerated the procedure well.  Findings:               Aortogram:  This demonstrated normal renal arteries and normal aorta.  The right common  iliac artery had about a 75 to 80% stenosis in its mid segment.  The right external iliac artery was fairly normal.  The left common iliac artery was minimally diseased but the distal left external iliac artery and most proximal common femoral artery at the top of the femoral head had about an 80 to 90% stenosis.             Left lower Extremity:  This demonstrated fairly normal profunda femoris artery.  The proximal common femoral artery had disease tracking down from the distal external iliac artery as described above.  The mid to distal left SFA had a high-grade stenosis in the 80 to 90% range.  The popliteal artery normalized.  There was a typical tibial trifurcation with three-vessel runoff distally.   Disposition: Patient was taken to  the recovery room in stable condition having tolerated the procedure well.  Complications: None  Festus Barren 02/04/2023 9:17 AM   This note was created with Dragon Medical transcription system. Any errors in dictation are purely unintentional.

## 2023-02-05 ENCOUNTER — Other Ambulatory Visit: Payer: Self-pay | Admitting: *Deleted

## 2023-02-05 ENCOUNTER — Encounter: Payer: Self-pay | Admitting: Vascular Surgery

## 2023-02-05 DIAGNOSIS — C541 Malignant neoplasm of endometrium: Secondary | ICD-10-CM

## 2023-02-05 NOTE — Progress Notes (Signed)
cbc

## 2023-02-06 ENCOUNTER — Inpatient Hospital Stay: Payer: PPO

## 2023-02-06 ENCOUNTER — Encounter: Payer: Self-pay | Admitting: Oncology

## 2023-02-06 ENCOUNTER — Inpatient Hospital Stay (HOSPITAL_BASED_OUTPATIENT_CLINIC_OR_DEPARTMENT_OTHER): Payer: PPO | Admitting: Oncology

## 2023-02-06 ENCOUNTER — Ambulatory Visit: Payer: PPO | Admitting: Oncology

## 2023-02-06 ENCOUNTER — Ambulatory Visit: Payer: PPO

## 2023-02-06 ENCOUNTER — Other Ambulatory Visit: Payer: PPO

## 2023-02-06 VITALS — BP 148/49 | HR 55 | Temp 97.6°F | Resp 18

## 2023-02-06 DIAGNOSIS — D696 Thrombocytopenia, unspecified: Secondary | ICD-10-CM | POA: Diagnosis not present

## 2023-02-06 DIAGNOSIS — N950 Postmenopausal bleeding: Secondary | ICD-10-CM | POA: Diagnosis not present

## 2023-02-06 DIAGNOSIS — C7982 Secondary malignant neoplasm of genital organs: Secondary | ICD-10-CM | POA: Diagnosis not present

## 2023-02-06 DIAGNOSIS — C541 Malignant neoplasm of endometrium: Secondary | ICD-10-CM

## 2023-02-06 DIAGNOSIS — Z923 Personal history of irradiation: Secondary | ICD-10-CM | POA: Diagnosis not present

## 2023-02-06 DIAGNOSIS — D649 Anemia, unspecified: Secondary | ICD-10-CM | POA: Diagnosis not present

## 2023-02-06 DIAGNOSIS — E1165 Type 2 diabetes mellitus with hyperglycemia: Secondary | ICD-10-CM | POA: Diagnosis not present

## 2023-02-06 DIAGNOSIS — Z5112 Encounter for antineoplastic immunotherapy: Secondary | ICD-10-CM | POA: Diagnosis not present

## 2023-02-06 LAB — CBC WITH DIFFERENTIAL/PLATELET
Abs Immature Granulocytes: 0.06 10*3/uL (ref 0.00–0.07)
Basophils Absolute: 0 10*3/uL (ref 0.0–0.1)
Basophils Relative: 0 %
Eosinophils Absolute: 0.1 10*3/uL (ref 0.0–0.5)
Eosinophils Relative: 2 %
HCT: 31.9 % — ABNORMAL LOW (ref 36.0–46.0)
Hemoglobin: 10.2 g/dL — ABNORMAL LOW (ref 12.0–15.0)
Immature Granulocytes: 1 %
Lymphocytes Relative: 4 %
Lymphs Abs: 0.3 10*3/uL — ABNORMAL LOW (ref 0.7–4.0)
MCH: 27.3 pg (ref 26.0–34.0)
MCHC: 32 g/dL (ref 30.0–36.0)
MCV: 85.3 fL (ref 80.0–100.0)
Monocytes Absolute: 0.7 10*3/uL (ref 0.1–1.0)
Monocytes Relative: 8 %
Neutro Abs: 6.5 10*3/uL (ref 1.7–7.7)
Neutrophils Relative %: 85 %
Platelets: 149 10*3/uL — ABNORMAL LOW (ref 150–400)
RBC: 3.74 MIL/uL — ABNORMAL LOW (ref 3.87–5.11)
RDW: 14.6 % (ref 11.5–15.5)
WBC: 7.7 10*3/uL (ref 4.0–10.5)
nRBC: 0 % (ref 0.0–0.2)

## 2023-02-06 LAB — CMP (CANCER CENTER ONLY)
ALT: 13 U/L (ref 0–44)
AST: 19 U/L (ref 15–41)
Albumin: 3.3 g/dL — ABNORMAL LOW (ref 3.5–5.0)
Alkaline Phosphatase: 76 U/L (ref 38–126)
Anion gap: 8 (ref 5–15)
BUN: 18 mg/dL (ref 8–23)
CO2: 27 mmol/L (ref 22–32)
Calcium: 8.3 mg/dL — ABNORMAL LOW (ref 8.9–10.3)
Chloride: 100 mmol/L (ref 98–111)
Creatinine: 0.99 mg/dL (ref 0.44–1.00)
GFR, Estimated: >60 mL/min (ref >60)
Glucose, Bld: 308 mg/dL — ABNORMAL HIGH (ref 70–99)
Potassium: 3.8 mmol/L (ref 3.5–5.1)
Sodium: 135 mmol/L (ref 135–145)
Total Bilirubin: 0.7 mg/dL (ref 0.3–1.2)
Total Protein: 6 g/dL — ABNORMAL LOW (ref 6.5–8.1)

## 2023-02-06 MED ORDER — SODIUM CHLORIDE 0.9 % IV SOLN
200.0000 mg | Freq: Once | INTRAVENOUS | Status: AC
  Administered 2023-02-06: 200 mg via INTRAVENOUS
  Filled 2023-02-06: qty 8

## 2023-02-06 MED ORDER — HEPARIN SOD (PORK) LOCK FLUSH 100 UNIT/ML IV SOLN
500.0000 [IU] | Freq: Once | INTRAVENOUS | Status: AC | PRN
  Administered 2023-02-06: 500 [IU]
  Filled 2023-02-06: qty 5

## 2023-02-06 MED ORDER — SODIUM CHLORIDE 0.9 % IV SOLN
Freq: Once | INTRAVENOUS | Status: AC
  Filled 2023-02-06: qty 250

## 2023-02-06 NOTE — Progress Notes (Signed)
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  INTERVAL HISTORY: Patient returns to clinic today for further evaluation and continuation of maintenance Keytruda.  Patient has not had repair of her right hamstring tendon rupture, but did undergo lower extremity angiography with vascular surgery recently.  She continues to have occasional falls, but otherwise feels well.  She is tolerating her treatment without significant side effects.  She has no neurologic complaints.  She denies any recent fevers or illnesses.  She has a good appetite and denies weight loss.  She has no chest pain, shortness of breath, cough, or hemoptysis.  She denies any nausea, vomiting, constipation, or diarrhea.  She has no urinary complaints.  Patient offers no further specific complaints today.  REVIEW OF SYSTEMS:   Review of Systems  Constitutional: Negative.  Negative for fever, malaise/fatigue and weight loss.  Respiratory: Negative.  Negative for cough, hemoptysis and shortness of breath.   Cardiovascular: Negative.  Negative for chest pain and leg swelling.  Gastrointestinal: Negative.  Negative for abdominal pain, blood in stool and melena.  Genitourinary: Negative.  Negative for dysuria.  Musculoskeletal:  Positive for falls. Negative for back pain.  Skin: Negative.  Negative for rash.  Neurological: Negative.  Negative for dizziness, focal weakness, weakness and headaches.  Psychiatric/Behavioral: Negative.  The patient is not nervous/anxious.     As per HPI. Otherwise, a complete review of systems is negative.  PAST MEDICAL HISTORY: Past Medical History:  Diagnosis Date   Adenomatous colon polyp     Aortic stenosis    Arthritis    Asthma    Claudication (HCC)    COPD (chronic obstructive pulmonary disease) (HCC)    Depression    Diabetes mellitus without complication (HCC)    Esophageal dysphagia    GERD (gastroesophageal reflux disease)    Hyperlipemia    Hypertension    Obesity    Severe obesity (BMI 35.0-35.9 with comorbidity) (HCC)     PAST SURGICAL HISTORY: Past Surgical History:  Procedure Laterality Date   BLADDER SURGERY     COLONOSCOPY WITH PROPOFOL N/A 08/05/2015   Procedure: COLONOSCOPY WITH PROPOFOL;  Surgeon: Elnita Maxwell, MD;  Location: [facility] ENDOSCOPY;  Service: Endoscopy;  Laterality: N/A;   ESOPHAGOGASTRODUODENOSCOPY N/A 01/09/2022   Procedure: ESOPHAGOGASTRODUODENOSCOPY (EGD);  Surgeon: Regis Bill, MD;  Location: [facility] ENDOSCOPY;  Service: Endoscopy;  Laterality: N/A;   FOOT SURGERY Left    FRACTURE SURGERY     IR CV LINE INJECTION  06/22/2022   IR IMAGING GUIDED PORT INSERTION  06/05/2022   IR PORT REPAIR CENTRAL VENOUS ACCESS DEVICE  07/02/2022   LOWER EXTREMITY ANGIOGRAPHY Left 02/04/2023   Procedure: Lower Extremity Angiography;  Surgeon: Annice Needy, MD;  Location: [facility] INVASIVE CV LAB;  Service: Cardiovascular;  Laterality: Left;   ORIF ANKLE FRACTURE Left 03/26/2017   Procedure: OPEN REDUCTION INTERNAL FIXATION (ORIF) ANKLE FRACTURE;  Surgeon: Christena Flake, MD;  Location: [facility] ORS;  Service: Orthopedics;  Laterality: Left;    FAMILY HISTORY: Family History  Problem Relation Age of Onset   CVA Mother    Diabetes Mother    CAD Father    Breast cancer Neg Hx     ADVANCED DIRECTIVES (Y/N):  N  [facility]  Telephone:(336) [phone] Fax:(336) [phone]  ID: Terri [name] OB: 04/15/1951  MR#: [id number]  AOZ#:[id number]  Patient Care Team: Dana Allan, MD as PCP - General (Family Medicine) Benita Gutter, RN as Oncology Nurse Navigator  CHIEF COMPLAINT: 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  INTERVAL HISTORY: Patient returns to clinic today for further evaluation and continuation of maintenance Keytruda.  Patient has not had repair of her right hamstring tendon rupture, but did undergo lower extremity angiography with vascular surgery recently.  She continues to have occasional falls, but otherwise feels well.  She is tolerating her treatment without significant side effects.  She has no neurologic complaints.  She denies any recent fevers or illnesses.  She has a good appetite and denies weight loss.  She has no chest pain, shortness of breath, cough, or hemoptysis.  She denies any nausea, vomiting, constipation, or diarrhea.  She has no urinary complaints.  Patient offers no further specific complaints today.  REVIEW OF SYSTEMS:   Review of Systems  Constitutional: Negative.  Negative for fever, malaise/fatigue and weight loss.  Respiratory: Negative.  Negative for cough, hemoptysis and shortness of breath.   Cardiovascular: Negative.  Negative for chest pain and leg swelling.  Gastrointestinal: Negative.  Negative for abdominal pain, blood in stool and melena.  Genitourinary: Negative.  Negative for dysuria.  Musculoskeletal:  Positive for falls. Negative for back pain.  Skin: Negative.  Negative for rash.  Neurological: Negative.  Negative for dizziness, focal weakness, weakness and headaches.  Psychiatric/Behavioral: Negative.  The patient is not nervous/anxious.     As per HPI. Otherwise, a complete review of systems is negative.  PAST MEDICAL HISTORY: Past Medical History:  Diagnosis Date   Adenomatous colon polyp     Aortic stenosis    Arthritis    Asthma    Claudication (HCC)    COPD (chronic obstructive pulmonary disease) (HCC)    Depression    Diabetes mellitus without complication (HCC)    Esophageal dysphagia    GERD (gastroesophageal reflux disease)    Hyperlipemia    Hypertension    Obesity    Severe obesity (BMI 35.0-35.9 with comorbidity) (HCC)     PAST SURGICAL HISTORY: Past Surgical History:  Procedure Laterality Date   BLADDER SURGERY     COLONOSCOPY WITH PROPOFOL N/A 08/05/2015   Procedure: COLONOSCOPY WITH PROPOFOL;  Surgeon: Elnita Maxwell, MD;  Location: [facility] ENDOSCOPY;  Service: Endoscopy;  Laterality: N/A;   ESOPHAGOGASTRODUODENOSCOPY N/A 01/09/2022   Procedure: ESOPHAGOGASTRODUODENOSCOPY (EGD);  Surgeon: Regis Bill, MD;  Location: [facility] ENDOSCOPY;  Service: Endoscopy;  Laterality: N/A;   FOOT SURGERY Left    FRACTURE SURGERY     IR CV LINE INJECTION  06/22/2022   IR IMAGING GUIDED PORT INSERTION  06/05/2022   IR PORT REPAIR CENTRAL VENOUS ACCESS DEVICE  07/02/2022   LOWER EXTREMITY ANGIOGRAPHY Left 02/04/2023   Procedure: Lower Extremity Angiography;  Surgeon: Annice Needy, MD;  Location: [facility] INVASIVE CV LAB;  Service: Cardiovascular;  Laterality: Left;   ORIF ANKLE FRACTURE Left 03/26/2017   Procedure: OPEN REDUCTION INTERNAL FIXATION (ORIF) ANKLE FRACTURE;  Surgeon: Christena Flake, MD;  Location: [facility] ORS;  Service: Orthopedics;  Laterality: Left;    FAMILY HISTORY: Family History  Problem Relation Age of Onset   CVA Mother    Diabetes Mother    CAD Father    Breast cancer Neg Hx     ADVANCED DIRECTIVES (Y/N):  N  Lab Results  Component Value Date   NA 135 02/06/2023   K 3.8 02/06/2023   CL 100 02/06/2023   CO2 27 02/06/2023   GLUCOSE 308 (H) 02/06/2023   BUN 18 02/06/2023   CREATININE 0.99 02/06/2023   CALCIUM 8.3 (L) 02/06/2023   PROT 6.0 (L) 02/06/2023   ALBUMIN 3.3 (L) 02/06/2023   AST 19 02/06/2023   ALT 13 02/06/2023   ALKPHOS 76 02/06/2023   BILITOT 0.7 02/06/2023   GFRNONAA >60 02/06/2023   GFRAA >60 03/27/2017    Lab Results  Component Value Date   WBC 7.7 02/06/2023   NEUTROABS 6.5 02/06/2023   HGB 10.2 (L) 02/06/2023   HCT 31.9 (L) 02/06/2023   MCV 85.3 02/06/2023   PLT 149 (L) 02/06/2023     STUDIES: PERIPHERAL VASCULAR CATHETERIZATION  Result Date: 02/04/2023 See surgical note for result.  NM PET Image Restag (PS) Skull Base To Thigh  Result Date: 01/28/2023 CLINICAL DATA:  Subsequent treatment strategy for endometrial cancer. EXAM: NUCLEAR MEDICINE PET SKULL BASE TO THIGH TECHNIQUE: 10.2 mCi F-18 FDG was injected intravenously. Full-ring PET imaging was performed from the skull base to thigh after the radiotracer. CT data was obtained  and used for attenuation correction and anatomic localization. Fasting blood glucose: 175 mg/dl COMPARISON:  CT chest abdomen pelvis dated 10/08/2022. PET-CT dated 08/10/2022. FINDINGS: Mediastinal blood pool activity: SUV max 2.9 Liver activity: SUV max NA NECK: No hypermetabolic cervical lymphadenopathy. Incidental CT findings: None. CHEST: No suspicious pulmonary nodules. No hypermetabolic thoracic lymphadenopathy. Right chest port terminates at the cavoatrial junction. Incidental CT findings: Atherosclerotic calcifications of the aortic arch. Mild three-vessel coronary atherosclerosis. ABDOMEN/PELVIS: Mild residual hypermetabolism in the lower uterine segment, max SUV 4.8. Small bilateral common iliac nodes, including a 9 mm short axis node on the left (series 6/image 109) with max SUV 2.1 and an 8 mm short axis node on the right (series 6/image 109) with max SUV 3.0. No abnormal hypermetabolism in the liver, spleen, pancreas, or adrenal glands. Subcutaneous stranding within metabolism along the bilateral anterior abdominal wall, reflecting max SUV 3.2 on the right, favoring sequela of medication injection Mild presacral stranding, likely reflecting radiation changes. Incidental CT findings: Small hiatal hernia. Atherosclerotic calcifications of the abdominal aorta and branch vessels. SKELETON: No focal hypermetabolic activity to suggest skeletal metastasis. Healing fractures of the right anterior 3rd and 4th ribs, max SUV 4.8, posttraumatic. Incidental CT findings: Degenerative changes of the visualized thoracolumbar spine. IMPRESSION: Mild residual hypermetabolism in the lower uterine segment, nonspecific. Residual/viable tumor is not excluded. Small bilateral common iliac nodes, with associated mild hypermetabolism, suggesting small nodal metastases. Healing fractures of the right anterior 3rd and 4th ribs, posttraumatic. Electronically Signed   By: Charline Bills M.D.   On: 01/28/2023 22:44   VAS [place] ABI  WITH/WO TBI  Result Date: 01/28/2023  LOWER EXTREMITY DOPPLER STUDY Patient Name:  Terri [name]  Date of Exam:   01/18/2023 Medical Rec #: [id number]    Accession #:    [id number] Date of Birth: 12-02-1950    Patient Gender: F Patient Age:   28 years Exam Location:  [facility] Procedure:      VAS [place] ABI WITH/WO TBI Referring Phys: Sheppard Plumber --------------------------------------------------------------------------------  Indications: Claudication. High Risk Factors: Hypertension, hyperlipidemia, Diabetes. Other Factors: Over 10 years bilateral calf claudication.  Performing Technologist: Hardie Lora RVT  Examination Guidelines: A complete evaluation includes at minimum, Doppler waveform signals and systolic blood pressure reading at the level of  Lab Results  Component Value Date   NA 135 02/06/2023   K 3.8 02/06/2023   CL 100 02/06/2023   CO2 27 02/06/2023   GLUCOSE 308 (H) 02/06/2023   BUN 18 02/06/2023   CREATININE 0.99 02/06/2023   CALCIUM 8.3 (L) 02/06/2023   PROT 6.0 (L) 02/06/2023   ALBUMIN 3.3 (L) 02/06/2023   AST 19 02/06/2023   ALT 13 02/06/2023   ALKPHOS 76 02/06/2023   BILITOT 0.7 02/06/2023   GFRNONAA >60 02/06/2023   GFRAA >60 03/27/2017    Lab Results  Component Value Date   WBC 7.7 02/06/2023   NEUTROABS 6.5 02/06/2023   HGB 10.2 (L) 02/06/2023   HCT 31.9 (L) 02/06/2023   MCV 85.3 02/06/2023   PLT 149 (L) 02/06/2023     STUDIES: PERIPHERAL VASCULAR CATHETERIZATION  Result Date: 02/04/2023 See surgical note for result.  NM PET Image Restag (PS) Skull Base To Thigh  Result Date: 01/28/2023 CLINICAL DATA:  Subsequent treatment strategy for endometrial cancer. EXAM: NUCLEAR MEDICINE PET SKULL BASE TO THIGH TECHNIQUE: 10.2 mCi F-18 FDG was injected intravenously. Full-ring PET imaging was performed from the skull base to thigh after the radiotracer. CT data was obtained  and used for attenuation correction and anatomic localization. Fasting blood glucose: 175 mg/dl COMPARISON:  CT chest abdomen pelvis dated 10/08/2022. PET-CT dated 08/10/2022. FINDINGS: Mediastinal blood pool activity: SUV max 2.9 Liver activity: SUV max NA NECK: No hypermetabolic cervical lymphadenopathy. Incidental CT findings: None. CHEST: No suspicious pulmonary nodules. No hypermetabolic thoracic lymphadenopathy. Right chest port terminates at the cavoatrial junction. Incidental CT findings: Atherosclerotic calcifications of the aortic arch. Mild three-vessel coronary atherosclerosis. ABDOMEN/PELVIS: Mild residual hypermetabolism in the lower uterine segment, max SUV 4.8. Small bilateral common iliac nodes, including a 9 mm short axis node on the left (series 6/image 109) with max SUV 2.1 and an 8 mm short axis node on the right (series 6/image 109) with max SUV 3.0. No abnormal hypermetabolism in the liver, spleen, pancreas, or adrenal glands. Subcutaneous stranding within metabolism along the bilateral anterior abdominal wall, reflecting max SUV 3.2 on the right, favoring sequela of medication injection Mild presacral stranding, likely reflecting radiation changes. Incidental CT findings: Small hiatal hernia. Atherosclerotic calcifications of the abdominal aorta and branch vessels. SKELETON: No focal hypermetabolic activity to suggest skeletal metastasis. Healing fractures of the right anterior 3rd and 4th ribs, max SUV 4.8, posttraumatic. Incidental CT findings: Degenerative changes of the visualized thoracolumbar spine. IMPRESSION: Mild residual hypermetabolism in the lower uterine segment, nonspecific. Residual/viable tumor is not excluded. Small bilateral common iliac nodes, with associated mild hypermetabolism, suggesting small nodal metastases. Healing fractures of the right anterior 3rd and 4th ribs, posttraumatic. Electronically Signed   By: Charline Bills M.D.   On: 01/28/2023 22:44   VAS [place] ABI  WITH/WO TBI  Result Date: 01/28/2023  LOWER EXTREMITY DOPPLER STUDY Patient Name:  Terri [name]  Date of Exam:   01/18/2023 Medical Rec #: [id number]    Accession #:    [id number] Date of Birth: 12-02-1950    Patient Gender: F Patient Age:   28 years Exam Location:  [facility] Procedure:      VAS [place] ABI WITH/WO TBI Referring Phys: Sheppard Plumber --------------------------------------------------------------------------------  Indications: Claudication. High Risk Factors: Hypertension, hyperlipidemia, Diabetes. Other Factors: Over 10 years bilateral calf claudication.  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  INTERVAL HISTORY: Patient returns to clinic today for further evaluation and continuation of maintenance Keytruda.  Patient has not had repair of her right hamstring tendon rupture, but did undergo lower extremity angiography with vascular surgery recently.  She continues to have occasional falls, but otherwise feels well.  She is tolerating her treatment without significant side effects.  She has no neurologic complaints.  She denies any recent fevers or illnesses.  She has a good appetite and denies weight loss.  She has no chest pain, shortness of breath, cough, or hemoptysis.  She denies any nausea, vomiting, constipation, or diarrhea.  She has no urinary complaints.  Patient offers no further specific complaints today.  REVIEW OF SYSTEMS:   Review of Systems  Constitutional: Negative.  Negative for fever, malaise/fatigue and weight loss.  Respiratory: Negative.  Negative for cough, hemoptysis and shortness of breath.   Cardiovascular: Negative.  Negative for chest pain and leg swelling.  Gastrointestinal: Negative.  Negative for abdominal pain, blood in stool and melena.  Genitourinary: Negative.  Negative for dysuria.  Musculoskeletal:  Positive for falls. Negative for back pain.  Skin: Negative.  Negative for rash.  Neurological: Negative.  Negative for dizziness, focal weakness, weakness and headaches.  Psychiatric/Behavioral: Negative.  The patient is not nervous/anxious.     As per HPI. Otherwise, a complete review of systems is negative.  PAST MEDICAL HISTORY: Past Medical History:  Diagnosis Date   Adenomatous colon polyp     Aortic stenosis    Arthritis    Asthma    Claudication (HCC)    COPD (chronic obstructive pulmonary disease) (HCC)    Depression    Diabetes mellitus without complication (HCC)    Esophageal dysphagia    GERD (gastroesophageal reflux disease)    Hyperlipemia    Hypertension    Obesity    Severe obesity (BMI 35.0-35.9 with comorbidity) (HCC)     PAST SURGICAL HISTORY: Past Surgical History:  Procedure Laterality Date   BLADDER SURGERY     COLONOSCOPY WITH PROPOFOL N/A 08/05/2015   Procedure: COLONOSCOPY WITH PROPOFOL;  Surgeon: Elnita Maxwell, MD;  Location: [facility] ENDOSCOPY;  Service: Endoscopy;  Laterality: N/A;   ESOPHAGOGASTRODUODENOSCOPY N/A 01/09/2022   Procedure: ESOPHAGOGASTRODUODENOSCOPY (EGD);  Surgeon: Regis Bill, MD;  Location: [facility] ENDOSCOPY;  Service: Endoscopy;  Laterality: N/A;   FOOT SURGERY Left    FRACTURE SURGERY     IR CV LINE INJECTION  06/22/2022   IR IMAGING GUIDED PORT INSERTION  06/05/2022   IR PORT REPAIR CENTRAL VENOUS ACCESS DEVICE  07/02/2022   LOWER EXTREMITY ANGIOGRAPHY Left 02/04/2023   Procedure: Lower Extremity Angiography;  Surgeon: Annice Needy, MD;  Location: [facility] INVASIVE CV LAB;  Service: Cardiovascular;  Laterality: Left;   ORIF ANKLE FRACTURE Left 03/26/2017   Procedure: OPEN REDUCTION INTERNAL FIXATION (ORIF) ANKLE FRACTURE;  Surgeon: Christena Flake, MD;  Location: [facility] ORS;  Service: Orthopedics;  Laterality: Left;    FAMILY HISTORY: Family History  Problem Relation Age of Onset   CVA Mother    Diabetes Mother    CAD Father    Breast cancer Neg Hx     ADVANCED DIRECTIVES (Y/N):  N

## 2023-02-06 NOTE — Patient Instructions (Signed)
[facility]  Discharge Instructions: Thank you for choosing [facility] to provide your oncology and hematology care.  If you have a lab appointment with the Cancer Center, please go directly to the Cancer Center and check in at the registration area.  Wear comfortable clothing and clothing appropriate for easy access to any Portacath or PICC line.   We strive to give you quality time with your provider. You may need to reschedule your appointment if you arrive late (15 or more minutes).  Arriving late affects you and other patients whose appointments are after yours.  Also, if you miss three or more appointments without notifying the office, you may be dismissed from the clinic at the provider's discretion.      For prescription refill requests, have your pharmacy contact our office and allow 72 hours for refills to be completed.    Today you received the following chemotherapy and/or immunotherapy agents- keytruda      To help prevent nausea and vomiting after your treatment, we encourage you to take your nausea medication as directed.  BELOW ARE SYMPTOMS THAT SHOULD BE REPORTED IMMEDIATELY: *FEVER GREATER THAN 100.4 F (38 C) OR HIGHER *CHILLS OR SWEATING *NAUSEA AND VOMITING THAT IS NOT CONTROLLED WITH YOUR NAUSEA MEDICATION *UNUSUAL SHORTNESS OF BREATH *UNUSUAL BRUISING OR BLEEDING *URINARY PROBLEMS (pain or burning when urinating, or frequent urination) *BOWEL PROBLEMS (unusual diarrhea, constipation, pain near the anus) TENDERNESS IN MOUTH AND THROAT WITH OR WITHOUT PRESENCE OF ULCERS (sore throat, sores in mouth, or a toothache) UNUSUAL RASH, SWELLING OR PAIN  UNUSUAL VAGINAL DISCHARGE OR ITCHING   Items with * indicate a potential emergency and should be followed up as soon as possible or go to the Emergency Department if any problems should occur.  Please show the CHEMOTHERAPY ALERT CARD or IMMUNOTHERAPY ALERT CARD at check-in to  the Emergency Department and triage nurse.  Should you have questions after your visit or need to cancel or reschedule your appointment, please contact [facility]  [phone] and follow the prompts.  Office hours are 8:00 a.m. to 4:30 p.m. Monday - Friday. Please note that voicemails left after 4:00 p.m. may not be returned until the following business day.  We are closed weekends and major holidays. You have access to a nurse at all times for urgent questions. Please call the main number to the clinic [phone] and follow the prompts.  For any non-urgent questions, you may also contact your provider using MyChart. We now offer e-Visits for anyone 18 and older to request care online for non-urgent symptoms. For details visit [email or web address].   Also download the MyChart app! Go to the app store, search "MyChart", open the app, select , and log in with your MyChart username and password.    

## 2023-02-07 ENCOUNTER — Ambulatory Visit: Payer: PPO | Admitting: Oncology

## 2023-02-07 ENCOUNTER — Other Ambulatory Visit: Payer: PPO

## 2023-02-07 ENCOUNTER — Ambulatory Visit: Payer: PPO

## 2023-02-13 ENCOUNTER — Inpatient Hospital Stay: Payer: PPO

## 2023-02-13 DIAGNOSIS — S76311A Strain of muscle, fascia and tendon of the posterior muscle group at thigh level, right thigh, initial encounter: Secondary | ICD-10-CM | POA: Diagnosis not present

## 2023-02-18 ENCOUNTER — Other Ambulatory Visit (INDEPENDENT_AMBULATORY_CARE_PROVIDER_SITE_OTHER): Payer: PPO

## 2023-02-18 DIAGNOSIS — E1169 Type 2 diabetes mellitus with other specified complication: Secondary | ICD-10-CM

## 2023-02-18 NOTE — Progress Notes (Addendum)
02/18/2023 Name: Terri [name] MRN: [id number] DOB: [birth year]  Subjective  Chief Complaint  Patient presents with   Diabetes   Hyperlipidemia   Hypertension    Reason for visit: Terri [name] is a 72 y.o. year old female who presented for a telephone visit.   They were referred to the pharmacist by their PCP for assistance in managing diabetes, hypertension, hyperlipidemia, and medication access.   Care Team: Primary Care Provider: Dana Allan, MD Pulmonology Dr. Aundria Rud; Oncology Dr. Orlie Dakin; Cardiology at [facility]  Reason for visit: ?  Terri [name] is a 72 y.o. female with a history of diabetes (type 2), who presents today for an initial diabetes pharmacotherapy visit.? 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   Known DM Complications: nephropathy w hx microalbuminuria; PAD with claudication BLE; HTN   Date of Last Diabetes Related Visit: 01/24/23 with PCP At Last Diabetes Related Visit: ?  Some low BG at home, 56. BG mainly between 90-120. Reports former PCP had tried to start different insulin but she is not sure what and dose.  Unclear why wanting to switch medication. Would like to wean sulfonylurea and insulin if possible to avoid hypoglycemic events Recommend switching to GLP1  Medication Access/Adherence: Prescription drug coverage: Payor: [employer] / Plan: [employer] PPO / Product Type: *No Product type* / .  Reports that all medications are not affordable. Cancer medication is expensive limiting her ability to pay for other medications. Current Patient Assistance: Yes; Novo Nordisk (, [place], [place]) Medication Adherence: Patient denies missing doses of their medication.    Since Last visit / History of Present Illness: ?  Patient reports seeing endocrinologist at [facility] who has since left the practice. Canceled her follow up and would like to be managed by  Primary Care. She reports a general history of hypoglycemia in the past. Now, she feels it is infrequent, ~once per week or less. Her Endo has enrolled her in NovoNordisk MAP to receive Novolog and Evaristo Bury (previously Levemir).   States that she has enough Levemir (~5 boxes) to last through the end of the year/beyond. Her endocrinologist discussed transitioning to [place], but patient states she wants to finish her Levemir first. Has been receiving [place] via NovoNordisk in the mail.   Denies any previous discussions of CGM in the past. Patient open to CGM if covered (though cannot afford adding another copay). Denies discussing GLP1RA or SGLT2i with previous endocrinologist.   Reported DM Regimen: ?  Glipizide SR 10 mg daily (morning) Levemir 25 units BID Novolog 7 units TID with meals Injects 15 units if sugar is "ridiculously high" which she defines as 160 mg/dL + 15 units with her food (30 units)   DM medications tried in the past:?  Metformin (diarrhea); switched to glipizide  Overall, patient thinks that blood sugars are unchanged since last diabetes related visit.   SMBG Per patient memory: ? Checks BG twice daily via glucometer Fasting: 100-110 mg/dL. Lowest in past 2 weeks: 70 mg/dL.  Before dinner (~7hPP lunch): 160-170 mg/dL; Highest 200 mg/dL.    Hypo/Hyperglycemia: ?  Symptoms of hypoglycemia since last visit:? yes Hypoglycemia symptoms ~once a week or less.  Symptoms of hyperglycemia since last visit:? no - none  Reported Diet: Patient typically eats 3 meals per day.  Breakfast (8:30): Unclear Lunch (12-1pm): Pizza (under 50 carbs); Sandwich; Chicken Engineer, petroleum (7-7:30 pm): Largest meal of the day Snacks: Sweet tooth (states that all  sweets are sugar-free).  Exercise: No  DM Prevention:  Statin: Taking; high intensity.?  History of chronic kidney disease? yes History of albuminuria? yes, last UACR on 01/24/23 = 10.4 mg/g (previously at [facility], 68.6 mg/g  11/24/20) ACE/ARB - Taking; Urine MA/CR Ratio - normal.  Last eye exam: 01/03/23 Last foot exam: Former Immunizations:? Flu: Due (Last: 01/16/2022); Pneumococcal: PCV13 (01/28/17); Shingrix: Due - No record; Covid (No record)  Cardiovascular Risk Reduction History of clinical ASCVD? yes; PAD with claudication BLE, aortic atherosclerosis History of heart failure? no History of hyperlipidemia? yes Current BMI: 35.9 kg/m2 (Ht 61 in, Wt 86.5, height kg) Taking statin? yes; high intensity (atorvastatin 80 mg) Taking aspirin? Taking   Taking SGLT-2i? no Taking GLP- 1 RA? no   Reported HTN Regimen: ?  Clonidine 0.2 mg BID hydrochlorothiazide and losartan (Hyzaar) 100-25 mg daily amlodipine 10 mg daily metoprolol succinate 50 mg daily  Patient is not checking their blood pressure at home regularly.   Patient denies hypotensive s/sx. No dizziness, lightheadedness.  Patient denies hypertensive symptoms. No headache, chest pain, shortness of breath, visual changes.     _______________________________________________  Objective    Review of Systems:?  Limited assessment due to virtual visit GI:? No nausea, vomiting, constipation, diarrhea, abdominal pain, dyspepsia, change in bowel habits  Endocrine:? No polyuria, polyphagia or blurred vision  Psych:? No depression, anxiety, insomnia    Physical Examination:  Vitals:  Wt Readings from Last 3 Encounters:  02/06/23 190 lb 12.8 oz (86.5 kg)  02/04/23 189 lb 11.2 oz (86 kg)  01/29/23 190 lb (86.2 kg)   BP Readings from Last 3 Encounters:  02/06/23 (!) 148/49  02/06/23 (!) 158/55  02/04/23 (!) 165/45   Pulse Readings from Last 3 Encounters:  02/06/23 (!) 55  02/06/23 (!) 57  02/04/23 (!) 53     Labs:?  Lab Results  Component Value Date   HGBA1C 7.2 (H) 12/25/2022   GLUCOSE 308 (H) 02/06/2023   MICRALBCREAT 10.4 01/24/2023   CREATININE 0.99 02/06/2023   CREATININE 0.90 02/04/2023   CREATININE 1.05 01/24/2023   GFR 52.91 (L)  01/24/2023    Lab Results  Component Value Date   CHOL 156 01/24/2023   LDLCALC 87 01/24/2023   HDL 46.20 01/24/2023   AST 19 02/06/2023   AST 12 01/24/2023   ALT 13 02/06/2023   ALT 9 01/24/2023      Chemistry      Component Value Date/Time   NA 135 02/06/2023 0915   K 3.8 02/06/2023 0915   CL 100 02/06/2023 0915   CO2 27 02/06/2023 0915   BUN 18 02/06/2023 0915   CREATININE 0.99 02/06/2023 0915      Component Value Date/Time   CALCIUM 8.3 (L) 02/06/2023 0915   ALKPHOS 76 02/06/2023 0915   AST 19 02/06/2023 0915   ALT 13 02/06/2023 0915   BILITOT 0.7 02/06/2023 0915       The 10-year ASCVD risk score (Arnett DK, et al., 2019) is: 51.7%  Assessment and Plan:   1. Diabetes, type 2: Relatively well controlled A1c 7.2% (12/25/22), goal <7% without hypoglycemia. Less aggressive goal <7.5% reasonable, though patient prefers lower sugars. Glucometer shows controlled FBG and moderately elevated PPBG after lunch (7hPP 160s mg/dL). Patient is correcting sugars with multiple doses of Novolog which is concerning given report of hypoglycemia. Concern for hypoglycemia unawareness overnight, not on CGM.   Secondly, patient is still using Levemir due to large supply at home. Approved for Evaristo Bury which she  is receiving form Novo. Evaristo Bury would offer more stable coverage with less daily injections though she prefers to use her supply of Levemir. Main goal at this time is to reduce risk hypoglycemia. Short-term goal to discontinue/reduce sulfonylurea with start of GLP1RA for added CV and renal benefit. With GLP1RA titration, expect reduction in insulin requirement over time. Will initiate MAP process today (several weeks).  Current Regimen: Glipizide SR 10 mg daily (morning), Levemir 25 units BID, Novolog 7 units TID with meals *Injects 15 units if sugar is "ridiculously high" which she defines as 160 mg/dL. 30 minutes later + 15 units with her food (30 units total) Continue medications today  without changes. Encouraged adherence to prescribed regimen Ozempic 0.25 mg Novo MAP application started. Will have patient sign at PCP visit scheduled next week We discussed Ozempic in detail. We also discussed that ideally, this will replace glipizide, and we will likely reduce insulin doses. Ideally, CGM will help to guide transition if covered.  SMBG: Libre 3 CGM prescription sent in [place] to evaluate coverage/cost ([facility]) Reviewed signs/symptoms/treatment of hypoglycemia. Discussed hypoglycemia risk of current medication regimen (insulins, glipizide).  Follow up office visit with pharmacist once approval is received for Novo for Ozempic 0.25 mg injection and/or CGM  Future Consideration: Prefer to discard Levemir and transition to [place] once daily (has both, likes to "have a backup supply").  GLP1-RA: Ideal agent at this time due to high efficacy with goal of discontinuing glipizide, and reducing insulin requirement as much as possible. If able, would be great to get rid of Novolog given frequent deviation from prescribed dose.  SGLT2i: Reasonable esp in the setting of diabetic kidney disease w hx microalbuminuria >30 mg/g. Would need to pursue medication assistance. Metformin: Reports previous intolerance, though insure if IR/ER.  TZD: Avoiding due to possible weight gain/increase in fracture risk.   2. HTN: uncontrolled based on last clinic BP of 148/49 mmHg (02/06/23), goal <130/80 mmHg. Does not monitor BP at home. Denies lightheadedness, dizziness, SOB, CP, vision changes.  Current Regimen: Clonidine 0.2 mg BID, losartan/hydrochlorothiazide (Hyzaar) 100-25 mg daily, amlodipine 10 mg daily, metoprolol succinate 50 mg daily Future Consideration: ARA: Consider spironolactone in the setting of apparent treatment resistant HTN. eGFR and K WNL and stable. No hx primary aldosteronism testing.  CCB: amlodipine dose maximized RAAS: losartan dose maximized - 100 mg; Could consider  longer-acting ARB (eg telmisartan; Micardis HCT = telmisartan/hctz) Thiazide: hydrochlorothiazide dose of 25 mg; Could consider transition to chlorthalidone 12.5 mg daily (max dose 25 mg daily after 2-4 weeks) BB: consider transition to carvedilol per additional alpha blocking activity (though may not receive much BP-lowering given alpha blocker clonidine already on board)   3. ASCVD (secondary prevention): LDL  above goal  on last lipid panel with LDL 87 mg/dL, TG 161 mh/dL (0/96/04). LDL goal <55 mg/dL (secondary prevention).  Key risk factors include: diabetes, hypertension, hyperlipidemia, former smoker, BMI >30 kg/m2, sedentary lifestyle, peripheral artery disease/claudication, and Known CAD (aortic atherosclerosis documented) Current Regimen: atorvastatin 80 mg daily Continue medications today without changes.  Future Consideration Add ezetimibe 10 mg daily for further LDL reduction   4. Healthcare Maintenance:  Pneumococcal - Current status: PCV13 2018 (no record of other pneumo vaccines)  Shingles - Current status: No record - Confirm if received - DUE Influenza - Current status: DUE  Due to receive the following vaccines: Influenza, Shingrix, PCV20, and Covid Booster CDC: Give one dose of PCV20, PCV21, or PPSV23 at least 1 year after PCV13.  Regardless of which vaccine is used (PCV20, PCV21, or PPSV23), their pneumococcal vaccinations are complete.    Follow Up Follow up with clinical pharmacist via office visit ~2-3 weeks once Ozempic is approved/delivered to clinic. Follow up sooner if CGM device is affordable/delivered for set up/training. ?   Future Appointments  Date Time Provider Department Center  02/25/2023 11:20 AM Dana Allan, MD [facility]  02/27/2023  8:15 AM [facility] VAN [facility] None  02/27/2023  9:00 AM [facility] FLUSH [facility] None  02/27/2023  9:30 AM Jeralyn Ruths, MD [facility] None  02/27/2023 10:00 AM [facility] INFUSION CHAIR 17 [facility] None   02/27/2023  1:00 PM [facility] GYN ONC [facility] None  03/04/2023  3:00 PM [facility] VASC 1 [facility] None  03/04/2023  4:00 PM Schnier, Latina Craver, MD [facility] None  03/20/2023 12:45 PM [facility] None  03/20/2023  1:30 PM [facility] INFUSION CHAIR 7 [facility] None    Loree Fee, PharmD Clinical Pharmacist [facility] [phone]

## 2023-02-19 DIAGNOSIS — S0231XD Fracture of orbital floor, right side, subsequent encounter for fracture with routine healing: Secondary | ICD-10-CM | POA: Diagnosis not present

## 2023-02-19 DIAGNOSIS — R296 Repeated falls: Secondary | ICD-10-CM | POA: Diagnosis not present

## 2023-02-19 DIAGNOSIS — Z860101 Personal history of adenomatous and serrated colon polyps: Secondary | ICD-10-CM | POA: Diagnosis not present

## 2023-02-19 DIAGNOSIS — Z79899 Other long term (current) drug therapy: Secondary | ICD-10-CM | POA: Diagnosis not present

## 2023-02-19 DIAGNOSIS — Z87891 Personal history of nicotine dependence: Secondary | ICD-10-CM | POA: Diagnosis not present

## 2023-02-19 DIAGNOSIS — Z9889 Other specified postprocedural states: Secondary | ICD-10-CM | POA: Diagnosis not present

## 2023-02-19 NOTE — Patient Instructions (Addendum)
[name],   It was a pleasure to see you today! As we discussed:?   Continue your medications as you have been taking them.  We discussed the new medication Ozempic. I will work on the application to receive this through [employer], the company that sends your insulins for free.  Once approved for Ozempic, we will set a follow up appointment to discuss how to start this medication.  I have also submitted an order for the blood sugar reader that you wear on your arm (we call this a continuous glucose monitor, or "CGM").  I will reach out to you regarding any updates on the glucose monitor and the Ozempic application    Please reach out prior to your next scheduled appointment should you have any questions or concerns.  Thank you!   Future Appointments  Date Time Provider Department Center  02/25/2023 11:20 AM Dana Allan, MD [facility]  02/27/2023  8:15 AM [facility] None  02/27/2023  9:00 AM [facility] FLUSH [facility] None  02/27/2023  9:30 AM Jeralyn Ruths, MD [facility] None  02/27/2023 10:00 AM [facility] INFUSION CHAIR 17 [facility] None  02/27/2023  1:00 PM [facility] GYN ONC [facility] None  03/04/2023  3:00 PM [facility] VASC 1 [facility] None  03/04/2023  4:00 PM Schnier, Latina Craver, MD [facility] None  03/20/2023 12:45 PM [facility] None  03/20/2023  1:30 PM [facility] INFUSION CHAIR 7 [facility] None    Loree Fee, PharmD Clinical Pharmacist [facility] [phone]

## 2023-02-21 ENCOUNTER — Encounter: Payer: Self-pay | Admitting: Pharmacist

## 2023-02-24 NOTE — Progress Notes (Unsigned)
   SUBJECTIVE:  No chief complaint on file.  HPI ***  PERTINENT PMH / PSH: ***  OBJECTIVE:  There were no vitals taken for this visit.   Physical Exam     01/24/2023    9:29 AM 05/30/2022    9:29 AM  Depression screen PHQ 2/9  Decreased Interest 0 0  Down, Depressed, Hopeless 0 0  PHQ - 2 Score 0 0  Altered sleeping 0   Tired, decreased energy 0   Change in appetite 0   Feeling bad or failure about yourself  0   Trouble concentrating 0   Moving slowly or fidgety/restless 0   Suicidal thoughts 0   PHQ-9 Score 0   Difficult doing work/chores Not difficult at all       01/24/2023    9:29 AM  GAD 7 : Generalized Anxiety Score  Nervous, Anxious, on Edge 0  Control/stop worrying 0  Worry too much - different things 0  Trouble relaxing 0  Restless 0  Easily annoyed or irritable 0  Afraid - awful might happen 0  Total GAD 7 Score 0  Anxiety Difficulty Not difficult at all    ASSESSMENT/PLAN:  There are no diagnoses linked to this encounter. PDMP reviewed***  No follow-ups on file.  Dana Allan, MD

## 2023-02-24 NOTE — Patient Instructions (Incomplete)
It was a pleasure meeting you today. Thank you for allowing me to take part in your health care.  Our goals for today as we discussed include:  Follow up with Pharmacy for initiation of Ozempic if able to get financial assistance Continue current medication and dosing and will be adjusted once starting Ozempic. Novolog 7 units 3 times a day with meals Levemir 25 units two times a day Glipizide 10 mg with breakfast  Foot exam today  Received Flu vaccine today   This is a list of the screening recommended for you and due dates:  Health Maintenance  Topic Date Due   COVID-19 Vaccine (1) Never done   Complete foot exam   Never done   Eye exam for diabetics  Never done   DTaP/Tdap/Td vaccine (1 - Tdap) Never done   Zoster (Shingles) Vaccine (1 of 2) Never done   DEXA scan (bone density measurement)  Never done   Flu Shot  07/29/2023*   Pneumonia Vaccine (2 of 2 - PPSV23 or PCV20) 01/29/2024*   Medicare Annual Wellness Visit  06/15/2023   Hemoglobin A1C  06/27/2023   Screening for Lung Cancer  10/08/2023   Yearly kidney health urinalysis for diabetes  01/24/2024   Yearly kidney function blood test for diabetes  02/06/2024   Mammogram  09/26/2024   Colon Cancer Screening  08/04/2025   Hepatitis C Screening  Completed   HPV Vaccine  Aged Out  *Topic was postponed. The date shown is not the original due date.     If you have any questions or concerns, please do not hesitate to call the office at [phone].  I look forward to our next visit and until then take care and stay safe.  Regards,   Dana Allan, MD   [facility]

## 2023-02-25 ENCOUNTER — Ambulatory Visit (INDEPENDENT_AMBULATORY_CARE_PROVIDER_SITE_OTHER): Payer: PPO | Admitting: Family Medicine

## 2023-02-25 ENCOUNTER — Encounter: Payer: Self-pay | Admitting: Family Medicine

## 2023-02-25 VITALS — BP 134/62 | HR 53 | Temp 97.9°F | Resp 16 | Ht 61.0 in | Wt 193.5 lb

## 2023-02-25 DIAGNOSIS — Z794 Long term (current) use of insulin: Secondary | ICD-10-CM

## 2023-02-25 DIAGNOSIS — E538 Deficiency of other specified B group vitamins: Secondary | ICD-10-CM

## 2023-02-25 DIAGNOSIS — Z23 Encounter for immunization: Secondary | ICD-10-CM

## 2023-02-25 DIAGNOSIS — I739 Peripheral vascular disease, unspecified: Secondary | ICD-10-CM

## 2023-02-25 DIAGNOSIS — E118 Type 2 diabetes mellitus with unspecified complications: Secondary | ICD-10-CM | POA: Diagnosis not present

## 2023-02-25 DIAGNOSIS — E559 Vitamin D deficiency, unspecified: Secondary | ICD-10-CM

## 2023-02-25 MED ORDER — SEMAGLUTIDE(0.25 OR 0.5MG/DOS) 2 MG/3ML ~~LOC~~ SOPN
0.2500 mg | PEN_INJECTOR | SUBCUTANEOUS | Status: DC
Start: 2023-02-25 — End: 2023-04-25

## 2023-02-25 NOTE — Progress Notes (Unsigned)
Medication Samples have been provided to the patient.  Drug name: Ozempic       Strength: 2mg /ml        Qty: 1  LOT: [id number]  Exp.Date: 04/29/24  Dosing instructions: Inject 0.25mg  into skin 1 time per week.   The patient has been instructed regarding the correct time, dose, and frequency of taking this medication, including desired effects and most common side effects.   Valentino Nose 12:28 PM 02/25/2023   Teaching on ozempic. Pt administered self 1st dose.  Will take on Mondays.

## 2023-02-27 ENCOUNTER — Encounter: Payer: Self-pay | Admitting: Family Medicine

## 2023-02-27 ENCOUNTER — Inpatient Hospital Stay: Payer: PPO

## 2023-02-27 ENCOUNTER — Encounter: Payer: Self-pay | Admitting: Oncology

## 2023-02-27 ENCOUNTER — Encounter: Payer: Self-pay | Admitting: Pharmacist

## 2023-02-27 ENCOUNTER — Ambulatory Visit: Payer: PPO

## 2023-02-27 ENCOUNTER — Inpatient Hospital Stay (HOSPITAL_BASED_OUTPATIENT_CLINIC_OR_DEPARTMENT_OTHER): Payer: PPO | Admitting: Oncology

## 2023-02-27 ENCOUNTER — Inpatient Hospital Stay (HOSPITAL_BASED_OUTPATIENT_CLINIC_OR_DEPARTMENT_OTHER): Payer: PPO | Admitting: Obstetrics and Gynecology

## 2023-02-27 VITALS — BP 159/47 | HR 51 | Temp 98.6°F | Resp 19 | Wt 191.0 lb

## 2023-02-27 DIAGNOSIS — Z5112 Encounter for antineoplastic immunotherapy: Secondary | ICD-10-CM | POA: Diagnosis not present

## 2023-02-27 DIAGNOSIS — C7982 Secondary malignant neoplasm of genital organs: Secondary | ICD-10-CM | POA: Diagnosis not present

## 2023-02-27 DIAGNOSIS — C541 Malignant neoplasm of endometrium: Secondary | ICD-10-CM

## 2023-02-27 LAB — CBC WITH DIFFERENTIAL (CANCER CENTER ONLY)
Abs Immature Granulocytes: 0.02 10*3/uL (ref 0.00–0.07)
Basophils Absolute: 0 10*3/uL (ref 0.0–0.1)
Basophils Relative: 1 %
Eosinophils Absolute: 0.2 10*3/uL (ref 0.0–0.5)
Eosinophils Relative: 5 %
HCT: 32.2 % — ABNORMAL LOW (ref 36.0–46.0)
Hemoglobin: 10.2 g/dL — ABNORMAL LOW (ref 12.0–15.0)
Immature Granulocytes: 1 %
Lymphocytes Relative: 8 %
Lymphs Abs: 0.3 10*3/uL — ABNORMAL LOW (ref 0.7–4.0)
MCH: 27.3 pg (ref 26.0–34.0)
MCHC: 31.7 g/dL (ref 30.0–36.0)
MCV: 86.3 fL (ref 80.0–100.0)
Monocytes Absolute: 0.5 10*3/uL (ref 0.1–1.0)
Monocytes Relative: 13 %
Neutro Abs: 2.7 10*3/uL (ref 1.7–7.7)
Neutrophils Relative %: 72 %
Platelet Count: 128 10*3/uL — ABNORMAL LOW (ref 150–400)
RBC: 3.73 MIL/uL — ABNORMAL LOW (ref 3.87–5.11)
RDW: 15.5 % (ref 11.5–15.5)
WBC Count: 3.7 10*3/uL — ABNORMAL LOW (ref 4.0–10.5)
nRBC: 0 % (ref 0.0–0.2)

## 2023-02-27 LAB — CMP (CANCER CENTER ONLY)
ALT: 13 U/L (ref 0–44)
AST: 16 U/L (ref 15–41)
Albumin: 3.3 g/dL — ABNORMAL LOW (ref 3.5–5.0)
Alkaline Phosphatase: 103 U/L (ref 38–126)
Anion gap: 5 (ref 5–15)
BUN: 22 mg/dL (ref 8–23)
CO2: 29 mmol/L (ref 22–32)
Calcium: 8.5 mg/dL — ABNORMAL LOW (ref 8.9–10.3)
Chloride: 104 mmol/L (ref 98–111)
Creatinine: 0.92 mg/dL (ref 0.44–1.00)
GFR, Estimated: >60 mL/min (ref >60)
Glucose, Bld: 151 mg/dL — ABNORMAL HIGH (ref 70–99)
Potassium: 3.9 mmol/L (ref 3.5–5.1)
Sodium: 138 mmol/L (ref 135–145)
Total Bilirubin: 0.5 mg/dL (ref 0.3–1.2)
Total Protein: 5.7 g/dL — ABNORMAL LOW (ref 6.5–8.1)

## 2023-02-27 LAB — TSH: TSH: 3.46 u[IU]/mL (ref 0.350–4.500)

## 2023-02-27 MED ORDER — HEPARIN SOD (PORK) LOCK FLUSH 100 UNIT/ML IV SOLN
500.0000 [IU] | Freq: Once | INTRAVENOUS | Status: AC
  Administered 2023-02-27: 500 [IU] via INTRAVENOUS
  Filled 2023-02-27: qty 5

## 2023-02-27 MED ORDER — SODIUM CHLORIDE 0.9 % IV SOLN
200.0000 mg | Freq: Once | INTRAVENOUS | Status: AC
  Administered 2023-02-27: 200 mg via INTRAVENOUS
  Filled 2023-02-27: qty 200

## 2023-02-27 MED ORDER — SODIUM CHLORIDE 0.9 % IV SOLN
Freq: Once | INTRAVENOUS | Status: AC
  Filled 2023-02-27: qty 250

## 2023-02-27 MED ORDER — SODIUM CHLORIDE 0.9% FLUSH
10.0000 mL | Freq: Once | INTRAVENOUS | Status: AC
  Administered 2023-02-27: 10 mL via INTRAVENOUS
  Filled 2023-02-27: qty 10

## 2023-02-27 MED ORDER — HEPARIN SOD (PORK) LOCK FLUSH 100 UNIT/ML IV SOLN
500.0000 [IU] | Freq: Once | INTRAVENOUS | Status: DC | PRN
  Filled 2023-02-27: qty 5

## 2023-02-27 NOTE — Patient Instructions (Signed)
[facility]  Discharge Instructions: Thank you for choosing [facility] to provide your oncology and hematology care.  If you have a lab appointment with the Cancer Center, please go directly to the Cancer Center and check in at the registration area.  Wear comfortable clothing and clothing appropriate for easy access to any Portacath or PICC line.   We strive to give you quality time with your provider. You may need to reschedule your appointment if you arrive late (15 or more minutes).  Arriving late affects you and other patients whose appointments are after yours.  Also, if you miss three or more appointments without notifying the office, you may be dismissed from the clinic at the provider's discretion.      For prescription refill requests, have your pharmacy contact our office and allow 72 hours for refills to be completed.    Today you received the following chemotherapy and/or immunotherapy agents KEYTURDA      To help prevent nausea and vomiting after your treatment, we encourage you to take your nausea medication as directed.  BELOW ARE SYMPTOMS THAT SHOULD BE REPORTED IMMEDIATELY: *FEVER GREATER THAN 100.4 F (38 C) OR HIGHER *CHILLS OR SWEATING *NAUSEA AND VOMITING THAT IS NOT CONTROLLED WITH YOUR NAUSEA MEDICATION *UNUSUAL SHORTNESS OF BREATH *UNUSUAL BRUISING OR BLEEDING *URINARY PROBLEMS (pain or burning when urinating, or frequent urination) *BOWEL PROBLEMS (unusual diarrhea, constipation, pain near the anus) TENDERNESS IN MOUTH AND THROAT WITH OR WITHOUT PRESENCE OF ULCERS (sore throat, sores in mouth, or a toothache) UNUSUAL RASH, SWELLING OR PAIN  UNUSUAL VAGINAL DISCHARGE OR ITCHING   Items with * indicate a potential emergency and should be followed up as soon as possible or go to the Emergency Department if any problems should occur.  Please show the CHEMOTHERAPY ALERT CARD or IMMUNOTHERAPY ALERT CARD at check-in to  the Emergency Department and triage nurse.  Should you have questions after your visit or need to cancel or reschedule your appointment, please contact [facility]  [phone] and follow the prompts.  Office hours are 8:00 a.m. to 4:30 p.m. Monday - Friday. Please note that voicemails left after 4:00 p.m. may not be returned until the following business day.  We are closed weekends and major holidays. You have access to a nurse at all times for urgent questions. Please call the main number to the clinic [phone] and follow the prompts.  For any non-urgent questions, you may also contact your provider using MyChart. We now offer e-Visits for anyone 52 and older to request care online for non-urgent symptoms. For details visit [email or web address].   Also download the MyChart app! Go to the app store, search "MyChart", open the app, select [place], and log in with your MyChart username and password.   Pembrolizumab Injection What is this medication? PEMBROLIZUMAB (PEM broe LIZ ue mab) treats some types of cancer. It works by helping your immune system slow or stop the spread of cancer cells. It is a monoclonal antibody. This medicine may be used for other purposes; ask your health care provider or pharmacist if you have questions. COMMON BRAND NAME(S): Keytruda What should I tell my care team before I take this medication? They need to know if you have any of these conditions: Allogeneic stem cell transplant (uses someone else's stem cells) Autoimmune diseases, such as Crohn disease, ulcerative colitis, lupus History of chest radiation Nervous system problems, such as Guillain-Barre syndrome, myasthenia  gravis Organ transplant An unusual or allergic reaction to pembrolizumab, other medications, foods, dyes, or preservatives Pregnant or trying to get pregnant Breast-feeding How should I use this medication? This medication is injected into a vein.  It is given by your care team in a hospital or clinic setting. A special MedGuide will be given to you before each treatment. Be sure to read this information carefully each time. Talk to your care team about the use of this medication in children. While it may be prescribed for children as young as 6 months for selected conditions, precautions do apply. Overdosage: If you think you have taken too much of this medicine contact a poison control center or emergency room at once. NOTE: This medicine is only for you. Do not share this medicine with others. What if I miss a dose? Keep appointments for follow-up doses. It is important not to miss your dose. Call your care team if you are unable to keep an appointment. What may interact with this medication? Interactions have not been studied. This list may not describe all possible interactions. Give your health care provider a list of all the medicines, herbs, non-prescription drugs, or dietary supplements you use. Also tell them if you smoke, drink alcohol, or use illegal drugs. Some items may interact with your medicine. What should I watch for while using this medication? Your condition will be monitored carefully while you are receiving this medication. You may need blood work while taking this medication. This medication may cause serious skin reactions. They can happen weeks to months after starting the medication. Contact your care team right away if you notice fevers or flu-like symptoms with a rash. The rash may be red or purple and then turn into blisters or peeling of the skin. You may also notice a red rash with swelling of the face, lips, or lymph nodes in your neck or under your arms. Tell your care team right away if you have any change in your eyesight. Talk to your care team if you may be pregnant. Serious birth defects can occur if you take this medication during pregnancy and for 4 months after the last dose. You will need a negative  pregnancy test before starting this medication. Contraception is recommended while taking this medication and for 4 months after the last dose. Your care team can help you find the option that works for you. Do not breastfeed while taking this medication and for 4 months after the last dose. What side effects may I notice from receiving this medication? Side effects that you should report to your care team as soon as possible: Allergic reactions--skin rash, itching, hives, swelling of the face, lips, tongue, or throat Dry cough, shortness of breath or trouble breathing Eye pain, redness, irritation, or discharge with blurry or decreased vision Heart muscle inflammation--unusual weakness or fatigue, shortness of breath, chest pain, fast or irregular heartbeat, dizziness, swelling of the ankles, feet, or hands Hormone gland problems--headache, sensitivity to light, unusual weakness or fatigue, dizziness, fast or irregular heartbeat, increased sensitivity to cold or heat, excessive sweating, constipation, hair loss, increased thirst or amount of urine, tremors or shaking, irritability Infusion reactions--chest pain, shortness of breath or trouble breathing, feeling faint or lightheaded Kidney injury (glomerulonephritis)--decrease in the amount of urine, red or dark Rieves urine, foamy or bubbly urine, swelling of the ankles, hands, or feet Liver injury--right upper belly pain, loss of appetite, nausea, light-colored stool, dark yellow or Majka urine, yellowing skin or eyes, unusual  weakness or fatigue Pain, tingling, or numbness in the hands or feet, muscle weakness, change in vision, confusion or trouble speaking, loss of balance or coordination, trouble walking, seizures Rash, fever, and swollen lymph nodes Redness, blistering, peeling, or loosening of the skin, including inside the mouth Sudden or severe stomach pain, bloody diarrhea, fever, nausea, vomiting Side effects that usually do not require  medical attention (report to your care team if they continue or are bothersome): Bone, joint, or muscle pain Diarrhea Fatigue Loss of appetite Nausea Skin rash This list may not describe all possible side effects. Call your doctor for medical advice about side effects. You may report side effects to FDA at 1-800-FDA-1088. Where should I keep my medication? This medication is given in a hospital or clinic. It will not be stored at home. NOTE: This sheet is a summary. It may not cover all possible information. If you have questions about this medicine, talk to your doctor, pharmacist, or health care provider.  2024 Elsevier/Gold Standard (2021-08-29 00:00:00)

## 2023-02-27 NOTE — Assessment & Plan Note (Signed)
Patient self-supplementing with over-the-counter Vitamin B12. -Continue current regimen as patient recently purchased a new supply.

## 2023-02-27 NOTE — Progress Notes (Signed)
Dexcom G7 prescription sent to [facility] for cost estimate. Patient would be interested in CGM if covered by insurance.   02/27/23: Received notification from [facility] that copay is $211.77 and therefore the order has been canceled based on patient request.

## 2023-02-27 NOTE — Progress Notes (Signed)
Gynecologic Oncology Consult Visit   Referring Provider: Dr. 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 female who is seen in consultation from Dr. 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   She has new vaginal bleeding/spotting, described as light, for past month.   PAP 01/16/23 AGUS favor cancer  PET scan  01/15/23 FINDINGS: Mediastinal blood pool activity: SUV max 2.9   Liver activity: SUV max NA   NECK: No hypermetabolic cervical lymphadenopathy.   Incidental CT findings: None.   CHEST: No suspicious pulmonary nodules.   No hypermetabolic thoracic lymphadenopathy.   Right chest port terminates at the cavoatrial junction.   Incidental CT findings: Atherosclerotic calcifications of the aortic arch. Mild three-vessel coronary atherosclerosis.   ABDOMEN/PELVIS: Mild residual hypermetabolism in the lower uterine segment, max SUV 4.8.   Small bilateral common iliac nodes, including a 9 mm short axis node on the left (series 6/image 109) with max SUV 2.1 and an 8 mm short axis node on the right (series 6/image 109) with max SUV 3.0.   No abnormal hypermetabolism in the liver, spleen, pancreas, or adrenal glands.   Subcutaneous stranding within metabolism along the bilateral anterior abdominal wall, reflecting max SUV 3.2 on the right, favoring sequela of medication injection   Mild presacral stranding, likely reflecting radiation changes.   Incidental CT findings: Small hiatal hernia. Atherosclerotic calcifications of the abdominal aorta and branch vessels.   SKELETON: No focal hypermetabolic activity to suggest skeletal metastasis.   Healing fractures of the right anterior 3rd and 4th ribs, max SUV 4.8, posttraumatic.   Incidental CT findings: Degenerative  changes of the visualized thoracolumbar spine.   IMPRESSION: Mild residual hypermetabolism in the lower uterine segment, nonspecific. Residual/viable tumor is not excluded.   Small bilateral common iliac nodes, with associated mild hypermetabolism, suggesting small nodal metastases.   Healing fractures of the right anterior 3rd and 4th ribs, posttraumatic.  Treatment Summary:  03/13/22- 04/18/22- pelvic radiation for extensive vaginal involvement of malignancy 05/15/22- CT C/A/P consistent with residual endometrial primary, borderline common iliac nodes similar to pet and not hypermetabolic. Scattered pulmonary nodules.  06/20/22- Cycle 1 Carbo-Taxol-Keytruda 07/11/22- Cycle 2 Carbo-taxol-keytruda 08/01/22- Cycle 3 Carbo-taxol-keytruda 08/14/22- PET - consistent with response to therapy 08/22/22- Cycle 4 Carbo-taxol-keytruda 09/12/22- Cycle 5 carbo-taxol-keytruda 10/03/22- Cycle 6 carbo-taxol-keytruda 10/12/22- CT C/A/P - unchanged appearance. No worsening disease. Radiation effects.  10/23/22- maintenance Rande Lawman  11/14/22- Rande Lawman 12/05/22- Rande Lawman 8/28/24Rande Lawman  Gyn Oncology History She was referred by Dr. Judithann Sheen to Dr. Jean Rosenthal for reports of postmenopausal bleeding for 6 months, may be more.  Scant.  Became heavier. Additionally has urinary incontinence.    On exam, friable cervical vs vaginal mass was partially visualized though exam was limited. Biopsy was performed.   Cervical Biopsy: poorly differentiated carcinoma, favor endometrial origin. Positive for p53 and ER. Focal staining for p16, negative for napsin and p63. HER2- 1+ negative. MSI/MMR- Stable  Colonoscopy was scheduled but prep inadequate.    PET/CT EXAM: 10/23 Marked diffuse hypermetabolic activity throughout the entire uterus and cervix, consistent with malignancy. This favors endometrial carcinoma over cervical carcinoma. Borderline enlarged bilateral common iliac lymph nodes noted, but without FDG uptake. No  definite evidence of metastatic disease by PET.  Patient seen 01/24/22 and found to have extensive cervical and upper vaginal involvement.  Only having light bleeding.  Decision made to treat her with primary radiation therapy because of extensive involvement of cervix and adjacent vagina, and she was reluctant to consider chemotherapy.  She received primary pelvic external radiation 50 Gy, completed 04/18/22.   05/15/22- CT Chest abdomen Pelvis w contrast IMPRESSION: 1. Central uterine hypoattenuation likely represents residual endometrial primary. 2. Borderline sized common iliac nodes are similar to the prior PET and were not hypermetabolic on that exam. Favored to be reactive. Otherwise, no evidence of metastatic disease in the abdomen or pelvis. 3. Scattered tiny pulmonary nodules are subpleural predominant and favored to represent subpleural lymph nodes. These can be re-evaluated at follow-up. 4.  Tiny hiatal hernia. 5. Interval nonacute anterior left third and fourth rib fractures. 6. Aortic atherosclerosis (ICD10-I70.0) and emphysema (ICD10-J43.9).  Treated with Carboplatin/Taxol and Keytruda x 3 cycles with excellent clinical response clinically.   CT/PET 4/24 shows response in the uterus.  See below. IMPRESSION: 1. Interval decrease in radiotracer uptake associated with the uterus compatible with response to therapy. SUV max is currently equal to 4.22, compared with 21.8 previously. 2. No significant change in size of bilateral common iliac lymph nodes with mild, nonspecific FDG uptake. 3. No new sites of disease identified. 4. Diffusely increased bone marrow activity is new compared with the previous exam and likely reflects treatment related changes.   Problem List: Patient Active Problem List   Diagnosis Date Noted   Dyspnea on exertion 02/02/2023   Vitamin D deficiency 02/02/2023   Vitamin B 12 deficiency 02/02/2023   Need for hepatitis C screening test 02/02/2023    Screening for osteoporosis 02/02/2023   Need for influenza vaccination 02/02/2023   Diabetic eye exam (HCC) 02/02/2023   Aortic atherosclerosis (HCC) 02/02/2023   Bradycardia 01/14/2023   Anemia 09/28/2022   Postmenopausal bleeding 01/30/2022   Endometrial cancer (HCC) 01/25/2022   Mild aortic stenosis 10/12/2020   Essential hypertension 11/05/2017   Closed fracture of left ankle 03/29/2017   Ankle fracture, left 03/26/2017   Acute respiratory failure with hypoxia (HCC) 08/05/2015   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   Hyperlipemia, mixed 04/05/2015   HTN, goal below 140/80 04/05/2015   Obesity (BMI 30-39.9) 04/05/2015   Type 2 diabetes mellitus with complication, with long-term current use of insulin (HCC) 04/05/2015   Class 2 severe obesity due to excess calories with serious comorbidity in adult (HCC) 08/19/2013   Obesity, Class II, BMI 35-39.9, with comorbidity 08/19/2013   Chronic obstructive pulmonary disease, unspecified (HCC) 07/31/2012   Claudication (HCC) 03/12/2012   Tobacco use 03/12/2012   Hyperlipidemia associated with type 2 diabetes mellitus (HCC) 06/06/2011   Hypertension associated with diabetes (HCC) 06/06/2011   Mood disorder (HCC) 06/06/2011   Type 2 diabetes with complication (HCC) 06/06/2011    Past Medical History: Past Medical History:  Diagnosis Date   Adenomatous colon polyp    Aortic stenosis    Arthritis    Asthma    Claudication (HCC)    COPD (chronic obstructive pulmonary disease) (HCC)    Depression    Diabetes mellitus without complication (HCC)    Esophageal dysphagia    GERD (gastroesophageal reflux disease)    Hyperlipemia    Hypertension    Obesity    Severe obesity (BMI 35.0-35.9 with comorbidity) (HCC)     Past Surgical History: Past Surgical History:  Procedure Laterality Date   BLADDER SURGERY     COLONOSCOPY WITH PROPOFOL N/A 08/05/2015   Procedure: COLONOSCOPY WITH PROPOFOL;  Surgeon: Addison Naegeli  Shelle Iron, MD;  Location: [facility]  ENDOSCOPY;  Service: Endoscopy;  Laterality: N/A;   ESOPHAGOGASTRODUODENOSCOPY N/A 01/09/2022   Procedure: ESOPHAGOGASTRODUODENOSCOPY (EGD);  Surgeon: Regis Bill, MD;  Location: [facility] ENDOSCOPY;  Service: Endoscopy;  Laterality: N/A;   FOOT SURGERY Left    FRACTURE SURGERY     IR CV LINE INJECTION  06/22/2022   IR IMAGING GUIDED PORT INSERTION  06/05/2022   IR PORT REPAIR CENTRAL VENOUS ACCESS DEVICE  07/02/2022   LOWER EXTREMITY ANGIOGRAPHY Left 02/04/2023   Procedure: Lower Extremity Angiography;  Surgeon: Annice Needy, MD;  Location: [facility] INVASIVE CV LAB;  Service: Cardiovascular;  Laterality: Left;   ORIF ANKLE FRACTURE Left 03/26/2017   Procedure: OPEN REDUCTION INTERNAL FIXATION (ORIF) ANKLE FRACTURE;  Surgeon: Christena Flake, MD;  Location: [facility] ORS;  Service: Orthopedics;  Laterality: Left;    OB History:  G17P0 OB History  No obstetric history on file.    Family History: Family History  Problem Relation Age of Onset   CVA Mother    Diabetes Mother    CAD Father    Breast cancer Neg Hx     Social History: Social History   Socioeconomic History   Marital status: Single    Spouse name: Not on file   Number of children: Not on file   Years of education: Not on file   Highest education level: Not on file  Occupational History   Not on file  Tobacco Use   Smoking status: Former    Current packs/day: 1.00    Average packs/day: 1 pack/day for 40.0 years (40.0 ttl pk-yrs)    Types: Cigarettes   Smokeless tobacco: Never   Tobacco comments:    Quite 2015  Vaping Use   Vaping status: Never Used  Substance and Sexual Activity   Alcohol use: No   Drug use: No   Sexual activity: Not on file  Other Topics Concern   Not on file  Social History Narrative   Lives alone.  Terri [name], here with her today.  Indoor cats.     Social Determinants of Health   Financial Resource Strain: Low Risk  (12/20/2022)   Received from [facility]   Overall  Financial Resource Strain (CARDIA)    Difficulty of Paying Living Expenses: Not very hard  Food Insecurity: No Food Insecurity (12/20/2022)   Received from [facility]   Hunger Vital Sign    Worried About Running Out of Food in the Last Year: Never true    Ran Out of Food in the Last Year: Never true  Transportation Needs: Unknown (12/20/2022)   Received from [facility] - Transportation    In the past 12 months, has lack of transportation kept you from medical appointments or from getting medications?: No    Lack of Transportation (Non-Medical): Not on file  Physical Activity: Inactive (09/20/2022)   Exercise Vital Sign    Days of Exercise per Week: 0 days    Minutes of Exercise per Session: 0 min  Stress: Stress Concern Present (09/20/2022)   [employer] of Occupational Health - Occupational Stress Questionnaire    Feeling of Stress : To some extent  Social Connections: Socially Isolated (09/20/2022)   Social Connection and Isolation Panel [NHANES]    Frequency of Communication with Friends and Family: Once a week    Frequency of Social Gatherings with Friends and Family: Never    Attends Religious Services: Never  Active Member of Clubs or Organizations: No    Attends Banker Meetings: Never    Marital Status: Never married  Intimate Partner Violence: Not At Risk (05/30/2022)   Humiliation, Afraid, Rape, and Kick questionnaire    Fear of Current or Ex-Partner: No    Emotionally Abused: No    Physically Abused: No    Sexually Abused: No    Allergies: No Known Allergies  Current Medications: Current Outpatient Medications  Medication Sig Dispense Refill   albuterol (PROVENTIL HFA;VENTOLIN HFA) 108 (90 Base) MCG/ACT inhaler Inhale 2 puffs into the lungs every 6 (six) hours as needed for wheezing or shortness of breath.     amLODipine (NORVASC) 10 MG tablet Take 10 mg by mouth daily.     aspirin EC 81 MG tablet  Take 1 tablet (81 mg total) by mouth daily. Swallow whole. 150 tablet 2   atorvastatin (LIPITOR) 80 MG tablet Take 80 mg by mouth daily.     buPROPion (WELLBUTRIN XL) 300 MG 24 hr tablet Take 300 mg by mouth daily.     Cholecalciferol (VITAMIN D3) 50 MCG (2000 UT) capsule Take 2,000 Units by mouth daily.     citalopram (CELEXA) 20 MG tablet Take 20 mg by mouth daily.     cloNIDine (CATAPRES) 0.2 MG tablet Take 0.2 mg by mouth 2 (two) times daily.     clopidogrel (PLAVIX) 75 MG tablet Take 1 tablet (75 mg total) by mouth daily. 30 tablet 11   cyanocobalamin (VITAMIN B12) 1000 MCG tablet Take 1 tablet (1,000 mcg total) by mouth daily. 90 tablet 3   glipiZIDE (GLUCOTROL XL) 10 MG 24 hr tablet Take 1 tablet (10 mg total) by mouth daily. Pt dropped bottle and is aware that insurance may not pay yet. May need cash pricing or coupon. Thank you. 30 tablet 0   insulin aspart (NOVOLOG) 100 UNIT/ML injection Inject 13 Units into the skin 3 (three) times daily before meals.     insulin detemir (LEVEMIR FLEXPEN) 100 UNIT/ML FlexPen Inject 25 units in the morning. Inject 23 units in the evening.     Iron, Ferrous Sulfate, 325 (65 Fe) MG TABS Take 325 mg by mouth every other day. 90 tablet 3   losartan-hydrochlorothiazide (HYZAAR) 100-25 MG tablet Take 1 tablet by mouth daily.     metoprolol succinate (TOPROL-XL) 50 MG 24 hr tablet Take 1 tablet by mouth daily.     montelukast (SINGULAIR) 10 MG tablet Take 10 mg by mouth at bedtime.     ONETOUCH VERIO test strip 1 each 3 (three) times daily.     Semaglutide,0.25 or 0.5MG /DOS, 2 MG/3ML SOPN Inject 0.25 mg into the skin once a week.     No current facility-administered medications for this visit.    Review of Systems General:  no complaints Skin: no complaints Eyes: no complaints HEENT: no complaints Breasts: no complaints Pulmonary: no complaints Cardiac: no complaints Gastrointestinal: no complaints Ob/Gyn: no complaints Musculoskeletal: no  complaints Hematology: no complaints Neurologic/Psych: depression   Objective:  Physical Examination:  BP (!) 159/47   Pulse (!) 51   Temp 98.6 F (37 C)   Resp 19   Wt 191 lb (86.6 kg)   SpO2 99%   BMI 36.09 kg/m     ECOG Performance Status: 1 - Symptomatic but completely ambulatory  GENERAL: Patient is a well appearing female in no acute distress HEENT:  Sclera clear. Anicteric NODES:  Negative axillary, supraclavicular, inguinal lymph node survery LUNGS:  Clear to auscultation bilaterally.   HEART:  Regular rate and rhythm.  ABDOMEN:  Soft, nontender.  No hernias, incisions well healed. No masses or ascites EXTREMITIES:  No peripheral edema. Atraumatic. No cyanosis SKIN:  Clear with no obvious rashes or skin changes.  NEURO:  Nonfocal. Well oriented.  Appropriate affect.  Pelvic exam: Chaperoned by CMA EGBUS: no lesions Cervix/Vagina: narrow with obvious recurrent cancer 3 cm replacing cervix and some involvement of adjacent anterior vaginal wall.  Uterus: normal size, nontender Adnexa: no palpable masses Rectovaginal: deferred  Lab Review Lab Results  Component Value Date   WBC 3.7 (L) 02/27/2023   HGB 10.2 (L) 02/27/2023   HCT 32.2 (L) 02/27/2023   MCV 86.3 02/27/2023   PLT 128 (L) 02/27/2023     Chemistry      Component Value Date/Time   NA 138 02/27/2023 0841   K 3.9 02/27/2023 0841   CL 104 02/27/2023 0841   CO2 29 02/27/2023 0841   BUN 22 02/27/2023 0841   CREATININE 0.92 02/27/2023 0841      Component Value Date/Time   CALCIUM 8.5 (L) 02/27/2023 0841   ALKPHOS 103 02/27/2023 0841   AST 16 02/27/2023 0841   ALT 13 02/27/2023 0841   BILITOT 0.5 02/27/2023 0841        Assessment:  Terri [name] is a 72 y.o. 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  PET scan shows involvement of entire uterus and cervix.  No distant disease. Bilateral common  iliac nodes are borderline enlarged, but not PET positive.   Poorly differentiated cancer with TP53 overexpression and negative HPV test so unlikely to be cervical primary. She was hesitant for chemotherapy and elected for radiation in view of her locally advanced disease.  Completed radiation 04/18/22 and on exam still had fleshy cancer involving upper vagina and flush cervix.  CT scan continued to show hypoattenuation of uterus cw persistent disease, but no evidence of distant disease.  Discussed that she was not a surgical candidate due to extensive upper vaginal involvement with cancer and decision made to start systemic therapy.  She received three cycles of carboplatin/taxol/keytruda and PET/CT 4/24 showed considerable decrease SUV in the uterus and no distant disease.  On exam 08/15/22 the vaginal disease had regressed dramatically. Completed 6 cycles of carboplain/taxol/keytruda 10/04/22.  CT scan showed no evidence of residual disease. Continued on maintenance Keytruda.    Some vaginal spotting 9/24, but no obvious evidence of disease on exam.  PET scan showed mild activity in lower uterine segment and two high common iliac nodes 7-8 mm. PAP AGUS concerning for cancer. On exam today, obvious regrowth of cancer replacing cervix with some extension to anterior vagina.    Cancer is TP53 mutated, MSS and HER2 negative.   Medical co-morbidities complicating care: obesity, COPD, aortic stenosis, PVD.  Plan:   Problem List Items Addressed This Visit       Genitourinary   Endometrial cancer (HCC) - Primary   Do not think she is a good candidate for surgery due to extent of vaginal recurrence and PET positive common iliac nodes and there is significant radiation effect in the vagina, so surgery could be problematic in terms of healing.  And she also has significant medical comorbidities that would increase perioperative risk.  Options for therapy include Doxil or clinical trial with TROP2 ADC.  However,  she does not want to travel to [facility] for treatment.  Lives alone and uses [facility] transport.   Will send Foundation testing to look for additional treatment options and discuss at [facility].  RTC in 4 weeks.   Leida Lauth, MD  CC:  Dana Allan, MD [address] [place],  [place] [address] [phone]

## 2023-02-27 NOTE — Assessment & Plan Note (Signed)
Recent surgery for venous disease in the right leg. -Follow-up with surgeon on November 4th.

## 2023-02-27 NOTE — Assessment & Plan Note (Signed)
Inconsistent insulin dosing and self-adjustment of insulin doses leading to variable blood glucose levels. Discussed the risks of hypoglycemia with excessive insulin dosing and the importance of consistent dosing. -Continue Glipizide XL 10 mg in the morning with a meal. -Continue Novolog 7 units three times a day with meals. -Continue Levemir 25 units twice daily. -Check blood glucose levels regularly and report to the doctor. -Start Rybelsus 3 mg daily, sample provided -Continue to follow up with Pharmacy for financial assistance

## 2023-02-27 NOTE — Progress Notes (Signed)
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  INTERVAL HISTORY: Patient returns to clinic today for further evaluation and continuation of maintenance Keytruda.  She has less leg pain and is walking better since her revascularization procedure.  She otherwise feels well.  She is tolerating her treatments without significant side effects. She has no neurologic complaints.  She denies any recent fevers or illnesses.  She has a good appetite and denies weight loss.  She has no chest pain, shortness of breath, cough, or hemoptysis.  She denies any nausea, vomiting, constipation, or diarrhea.  She has no urinary complaints.  Patient offers no specific complaints today.  REVIEW OF SYSTEMS:   Review of Systems  Constitutional: Negative.  Negative for fever, malaise/fatigue and weight loss.  Respiratory: Negative.  Negative for cough, hemoptysis and shortness of breath.   Cardiovascular: Negative.  Negative for chest pain and leg swelling.  Gastrointestinal: Negative.  Negative for abdominal pain, blood in stool and melena.  Genitourinary: Negative.  Negative for dysuria.  Musculoskeletal: Negative.  Negative for back pain and falls.  Skin: Negative.  Negative for rash.  Neurological: Negative.  Negative for dizziness, focal weakness, weakness and headaches.  Psychiatric/Behavioral: Negative.  The patient is not nervous/anxious.     As per HPI. Otherwise, a complete review of systems is negative.  PAST MEDICAL HISTORY: Past Medical History:  Diagnosis Date   Adenomatous colon polyp    Aortic stenosis    Arthritis    Asthma    Claudication (HCC)    COPD (chronic obstructive pulmonary  disease) (HCC)    Depression    Diabetes mellitus without complication (HCC)    Esophageal dysphagia    GERD (gastroesophageal reflux disease)    Hyperlipemia    Hypertension    Obesity    Severe obesity (BMI 35.0-35.9 with comorbidity) (HCC)     PAST SURGICAL HISTORY: Past Surgical History:  Procedure Laterality Date   BLADDER SURGERY     COLONOSCOPY WITH PROPOFOL N/A 08/05/2015   Procedure: COLONOSCOPY WITH PROPOFOL;  Surgeon: Elnita Maxwell, MD;  [name];  Service: Endoscopy;  Laterality: N/A;   ESOPHAGOGASTRODUODENOSCOPY N/A 01/09/2022   Procedure: ESOPHAGOGASTRODUODENOSCOPY (EGD);  Surgeon: Regis Bill, MD;  Location: [facility] ENDOSCOPY;  Service: Endoscopy;  Laterality: N/A;   FOOT SURGERY Left    FRACTURE SURGERY     IR CV LINE INJECTION  06/22/2022   IR IMAGING GUIDED PORT INSERTION  06/05/2022   IR PORT REPAIR CENTRAL VENOUS ACCESS DEVICE  07/02/2022   LOWER EXTREMITY ANGIOGRAPHY Left 02/04/2023   Procedure: Lower Extremity Angiography;  Surgeon: Annice Needy, MD;  Location: [facility] INVASIVE CV LAB;  Service: Cardiovascular;  Laterality: Left;   ORIF ANKLE FRACTURE Left 03/26/2017   Procedure: OPEN REDUCTION INTERNAL FIXATION (ORIF) ANKLE FRACTURE;  Surgeon: Christena Flake, MD;  Location: [facility] ORS;  Service: Orthopedics;  Laterality: Left;    FAMILY HISTORY: Family History  Problem Relation Age of Onset   CVA Mother    Diabetes Mother    CAD Father    Breast cancer Neg Hx     ADVANCED DIRECTIVES (Y/N):  N  HEALTH MAINTENANCE: Social History   Tobacco Use   Smoking status: Former  Current packs/day: 1.00    Average packs/day: 1 pack/day for 40.0 years (40.0 ttl pk-yrs)    Types: Cigarettes   Smokeless tobacco: Never   Tobacco comments:    Quite 2015  Vaping Use   Vaping status: Never Used  Substance Use Topics   Alcohol use: No   Drug use: No     Colonoscopy:  PAP:  Bone density:  Lipid panel:  No Known Allergies  Current  Outpatient Medications  Medication Sig Dispense Refill   albuterol (PROVENTIL HFA;VENTOLIN HFA) 108 (90 Base) MCG/ACT inhaler Inhale 2 puffs into the lungs every 6 (six) hours as needed for wheezing or shortness of breath.     amLODipine (NORVASC) 10 MG tablet Take 10 mg by mouth daily.     aspirin EC 81 MG tablet Take 1 tablet (81 mg total) by mouth daily. Swallow whole. 150 tablet 2   atorvastatin (LIPITOR) 80 MG tablet Take 80 mg by mouth daily.     buPROPion (WELLBUTRIN XL) 300 MG 24 hr tablet Take 300 mg by mouth daily.     Cholecalciferol (VITAMIN D3) 50 MCG (2000 UT) capsule Take 2,000 Units by mouth daily.     citalopram (CELEXA) 20 MG tablet Take 20 mg by mouth daily.     cloNIDine (CATAPRES) 0.2 MG tablet Take 0.2 mg by mouth 2 (two) times daily.     clopidogrel (PLAVIX) 75 MG tablet Take 1 tablet (75 mg total) by mouth daily. 30 tablet 11   cyanocobalamin (VITAMIN B12) 1000 MCG tablet Take 1 tablet (1,000 mcg total) by mouth daily. 90 tablet 3   glipiZIDE (GLUCOTROL XL) 10 MG 24 hr tablet Take 1 tablet (10 mg total) by mouth daily. Pt dropped bottle and is aware that insurance may not pay yet. May need cash pricing or coupon. Thank you. 30 tablet 0   insulin aspart (NOVOLOG) 100 UNIT/ML injection Inject 13 Units into the skin 3 (three) times daily before meals.     insulin detemir (LEVEMIR FLEXPEN) 100 UNIT/ML FlexPen Inject 25 units in the morning. Inject 23 units in the evening.     Iron, Ferrous Sulfate, 325 (65 Fe) MG TABS Take 325 mg by mouth every other day. 90 tablet 3   losartan-hydrochlorothiazide (HYZAAR) 100-25 MG tablet Take 1 tablet by mouth daily.     metoprolol succinate (TOPROL-XL) 50 MG 24 hr tablet Take 1 tablet by mouth daily.     montelukast (SINGULAIR) 10 MG tablet Take 10 mg by mouth at bedtime.     ONETOUCH VERIO test strip 1 each 3 (three) times daily.     Semaglutide,0.25 or 0.5MG /DOS, 2 MG/3ML SOPN Inject 0.25 mg into the skin once a week.     No current  facility-administered medications for this visit.   Facility-Administered Medications Ordered in Other Visits  Medication Dose Route Frequency Provider Last Rate Last Admin   heparin lock flush 100 unit/mL  500 Units Intravenous Once Jeralyn Ruths, MD        OBJECTIVE: Vitals:   02/27/23 0901  BP: (!) 148/52  Pulse: (!) 50  Resp: 16  Temp: (!) 96.5 F (35.8 C)  SpO2: 98%     Body mass index is 36.09 kg/m.    ECOG FS:0 - Asymptomatic  General: Well-developed, well-nourished, no acute distress. Eyes: Pink conjunctiva, anicteric sclera. HEENT: Normocephalic, moist mucous membranes. Lungs: No audible wheezing or coughing. Heart: Regular rate and rhythm. Abdomen: Soft, nontender, no obvious distention. Musculoskeletal: No edema, cyanosis, or clubbing.  Neuro: Alert, answering all questions appropriately. Cranial nerves grossly intact. Skin: No rashes or petechiae noted. Psych: Normal affect.  LAB RESULTS:  Lab Results  Component Value Date   NA 138 02/27/2023   K 3.9 02/27/2023   CL 104 02/27/2023   CO2 29 02/27/2023   GLUCOSE 151 (H) 02/27/2023   BUN 22 02/27/2023   CREATININE 0.92 02/27/2023   CALCIUM 8.5 (L) 02/27/2023   PROT 5.7 (L) 02/27/2023   ALBUMIN 3.3 (L) 02/27/2023   AST 16 02/27/2023   ALT 13 02/27/2023   ALKPHOS 103 02/27/2023   BILITOT 0.5 02/27/2023   GFRNONAA >60 02/27/2023   GFRAA >60 03/27/2017    Lab Results  Component Value Date   WBC 3.7 (L) 02/27/2023   NEUTROABS 2.7 02/27/2023   HGB 10.2 (L) 02/27/2023   HCT 32.2 (L) 02/27/2023   MCV 86.3 02/27/2023   PLT 128 (L) 02/27/2023     STUDIES: PERIPHERAL VASCULAR CATHETERIZATION  Result Date: 02/04/2023 See surgical note for result.   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  CT scan results from October 08, 2022 reviewed independently with no obvious evidence of progressive disease. She completed 6 cycles of carboplatinum, Taxol, and Keytruda on October 03, 2022.  Patient then initiated maintenance Keytruda on October 23, 2022.  Recommendation is to complete at least 2 years of maintenance treatment.  PET scan results from January 15, 2023 reviewed independently with mild nonspecific residual hypermetabolism in the lower uterine segment as well as small bilateral common iliac nodes possibly suggesting nodal metastasis.  No further intervention is needed.  Proceed with cycle 6 of treatment today.  Return to clinic in 3 weeks for treatment only and then in 6 weeks for further evaluation and consideration of cycle 8.   Port: Port revision successful.  Proceed with treatment as above. Anemia: Chronic and unchanged.  Patient's hemoglobin is 10.3 today.   Thrombocytopenia: Mild, monitor.  Patient's platelet count is 128. Hyperglycemia: Blood glucose mildly improved to 151.  Continue monitoring and treatment per primary care. Claudication symptoms: Resolved.  Patient underwent revascularization procedure on February 04, 2023. Right hamstring tendon rupture: Patient reports there is no plan for surgical repair.  Patient expressed understanding and was in agreement with this plan. She also understands that She can call clinic at any time with any questions, concerns, or complaints.    Cancer Staging  Endometrial cancer [facility]) Staging form: Corpus Uteri - Carcinoma and Carcinosarcoma, AJCC 8th Edition - Clinical stage from 05/30/2022: FIGO Stage IIIB (cT3b, cN0, cM0) - Signed by Jeralyn Ruths, MD on 05/30/2022 Stage prefix: Initial diagnosis   Jeralyn Ruths, MD   02/27/2023 9:59 AM

## 2023-02-27 NOTE — Assessment & Plan Note (Signed)
Patient self-supplementing with over-the-counter Vitamin D3 2000 IU daily. -Continue current regimen as patient recently purchased a new supply.

## 2023-02-28 ENCOUNTER — Other Ambulatory Visit: Payer: Self-pay

## 2023-02-28 ENCOUNTER — Other Ambulatory Visit (INDEPENDENT_AMBULATORY_CARE_PROVIDER_SITE_OTHER): Payer: Self-pay

## 2023-02-28 ENCOUNTER — Ambulatory Visit: Payer: PPO

## 2023-02-28 ENCOUNTER — Telehealth (INDEPENDENT_AMBULATORY_CARE_PROVIDER_SITE_OTHER): Payer: Self-pay

## 2023-02-28 LAB — SURGICAL PATHOLOGY

## 2023-02-28 LAB — T4: T4, Total: 7.5 ug/dL (ref 4.5–12.0)

## 2023-02-28 MED ORDER — CLOPIDOGREL BISULFATE 75 MG PO TABS: 75.0000 mg | ORAL_TABLET | Freq: Every day | ORAL | 3 refills | Status: DC

## 2023-02-28 NOTE — Telephone Encounter (Signed)
Patient left a message requesting for 90 day supply for Clopidogrel 75 mg to sent to [facility]. Prescription has been updated and sent. Patient has been notified.

## 2023-03-01 ENCOUNTER — Other Ambulatory Visit (INDEPENDENT_AMBULATORY_CARE_PROVIDER_SITE_OTHER): Payer: Self-pay | Admitting: Vascular Surgery

## 2023-03-01 ENCOUNTER — Telehealth: Payer: Self-pay

## 2023-03-01 DIAGNOSIS — Z9889 Other specified postprocedural states: Secondary | ICD-10-CM

## 2023-03-01 NOTE — Telephone Encounter (Signed)
[facility] CDx requested on specimen [id number], cervix, collected 02/27/2023.

## 2023-03-04 ENCOUNTER — Ambulatory Visit (INDEPENDENT_AMBULATORY_CARE_PROVIDER_SITE_OTHER): Payer: PPO | Admitting: Vascular Surgery

## 2023-03-04 ENCOUNTER — Ambulatory Visit (INDEPENDENT_AMBULATORY_CARE_PROVIDER_SITE_OTHER): Payer: PPO

## 2023-03-04 DIAGNOSIS — Z9889 Other specified postprocedural states: Secondary | ICD-10-CM | POA: Diagnosis not present

## 2023-03-04 DIAGNOSIS — I739 Peripheral vascular disease, unspecified: Secondary | ICD-10-CM | POA: Diagnosis not present

## 2023-03-07 ENCOUNTER — Other Ambulatory Visit: Payer: Self-pay | Admitting: Oncology

## 2023-03-08 ENCOUNTER — Encounter: Payer: Self-pay | Admitting: Family Medicine

## 2023-03-08 LAB — VAS US ABI WITH/WO TBI
Left ABI: 0.94
Right ABI: 0.87

## 2023-03-12 ENCOUNTER — Ambulatory Visit (INDEPENDENT_AMBULATORY_CARE_PROVIDER_SITE_OTHER): Payer: PPO | Admitting: Vascular Surgery

## 2023-03-12 ENCOUNTER — Encounter: Payer: Self-pay | Admitting: Obstetrics and Gynecology

## 2023-03-12 ENCOUNTER — Encounter (INDEPENDENT_AMBULATORY_CARE_PROVIDER_SITE_OTHER): Payer: Self-pay | Admitting: Vascular Surgery

## 2023-03-12 VITALS — BP 173/81 | HR 57 | Resp 16 | Wt 192.6 lb

## 2023-03-12 DIAGNOSIS — I70213 Atherosclerosis of native arteries of extremities with intermittent claudication, bilateral legs: Secondary | ICD-10-CM | POA: Diagnosis not present

## 2023-03-12 DIAGNOSIS — I10 Essential (primary) hypertension: Secondary | ICD-10-CM | POA: Diagnosis not present

## 2023-03-12 DIAGNOSIS — E782 Mixed hyperlipidemia: Secondary | ICD-10-CM | POA: Diagnosis not present

## 2023-03-12 DIAGNOSIS — E118 Type 2 diabetes mellitus with unspecified complications: Secondary | ICD-10-CM | POA: Diagnosis not present

## 2023-03-12 DIAGNOSIS — Z794 Long term (current) use of insulin: Secondary | ICD-10-CM | POA: Diagnosis not present

## 2023-03-12 DIAGNOSIS — C541 Malignant neoplasm of endometrium: Secondary | ICD-10-CM | POA: Diagnosis not present

## 2023-03-12 NOTE — Assessment & Plan Note (Signed)
blood glucose control important in reducing the progression of atherosclerotic disease. Also, involved in wound healing. On appropriate medications.  

## 2023-03-12 NOTE — Assessment & Plan Note (Signed)
ABIs done last week are 0.87 on the right and 0.94 on the left but both the digital pressures and waveforms were in the normal range.  Symptoms are improved after bilateral lower extremity revascularization several weeks ago.  Originally, there was some plan to perform intervention on the right leg but since we did iliac intervention and her flow responded, I think this can be put on hold for now and we can monitor this.  Follow-up in 3 months with noninvasive studies.

## 2023-03-12 NOTE — Progress Notes (Signed)
MRN : [id number]  Terri [name] is a 72 y.o. (1950-05-12) female who presents with chief complaint of  Chief Complaint  Patient presents with   Follow-up    [facility] 4 week follow up  .  History of Present Illness: Patient returns today in follow up of her peripheral arterial disease.  She is about a month after right common iliac artery stent placement in the left external iliac artery and superficial femoral artery stent placement.  Her legs are doing better.  She says she is really having no issues with the right leg currently.  She does still have some pain in the left leg but her mobility is significantly improved.  ABIs done last week are 0.87 on the right and 0.94 on the left but both the digital pressures and waveforms were in the normal range.  Current Outpatient Medications  Medication Sig Dispense Refill   albuterol (PROVENTIL HFA;VENTOLIN HFA) 108 (90 Base) MCG/ACT inhaler Inhale 2 puffs into the lungs every 6 (six) hours as needed for wheezing or shortness of breath.     amLODipine (NORVASC) 10 MG tablet Take 10 mg by mouth daily.     aspirin EC 81 MG tablet Take 1 tablet (81 mg total) by mouth daily. Swallow whole. 150 tablet 2   atorvastatin (LIPITOR) 80 MG tablet Take 80 mg by mouth daily.     buPROPion (WELLBUTRIN XL) 300 MG 24 hr tablet Take 300 mg by mouth daily.     Cholecalciferol (VITAMIN D3) 50 MCG (2000 UT) capsule Take 2,000 Units by mouth daily.     citalopram (CELEXA) 20 MG tablet Take 20 mg by mouth daily.     cloNIDine (CATAPRES) 0.2 MG tablet Take 0.2 mg by mouth 2 (two) times daily.     clopidogrel (PLAVIX) 75 MG tablet Take 1 tablet (75 mg total) by mouth daily. 90 tablet 3   cyanocobalamin (VITAMIN B12) 1000 MCG tablet Take 1 tablet (1,000 mcg total) by mouth daily. 90 tablet 3   glipiZIDE (GLUCOTROL XL) 10 MG 24 hr tablet Take 1 tablet (10 mg total) by mouth daily. Pt dropped bottle and is aware that insurance may not pay yet. May need cash pricing or coupon.  Thank you. 30 tablet 0   insulin aspart (NOVOLOG) 100 UNIT/ML injection Inject 13 Units into the skin 3 (three) times daily before meals.     insulin detemir (LEVEMIR FLEXPEN) 100 UNIT/ML FlexPen Inject 25 units in the morning. Inject 23 units in the evening.     Iron, Ferrous Sulfate, 325 (65 Fe) MG TABS Take 325 mg by mouth every other day. 90 tablet 3   losartan-hydrochlorothiazide (HYZAAR) 100-25 MG tablet Take 1 tablet by mouth daily.     metoprolol succinate (TOPROL-XL) 50 MG 24 hr tablet Take 1 tablet by mouth daily.     montelukast (SINGULAIR) 10 MG tablet Take 10 mg by mouth at bedtime.     ONETOUCH VERIO test strip 1 each 3 (three) times daily.     Semaglutide,0.25 or 0.5MG /DOS, 2 MG/3ML SOPN Inject 0.25 mg into the skin once a week.     No current facility-administered medications for this visit.    Past Medical History:  Diagnosis Date   Adenomatous colon polyp    Aortic stenosis    Arthritis    Asthma    Claudication (HCC)    COPD (chronic obstructive pulmonary disease) (HCC)    Depression    Diabetes mellitus without complication (HCC)  Esophageal dysphagia    GERD (gastroesophageal reflux disease)    Hyperlipemia    Hypertension    Obesity    Severe obesity (BMI 35.0-35.9 with comorbidity) (HCC)     Past Surgical History:  Procedure Laterality Date   BLADDER SURGERY     COLONOSCOPY WITH PROPOFOL N/A 08/05/2015   Procedure: COLONOSCOPY WITH PROPOFOL;  Surgeon: Elnita Maxwell, MD;  Location: [facility] ENDOSCOPY;  Service: Endoscopy;  Laterality: N/A;   ESOPHAGOGASTRODUODENOSCOPY N/A 01/09/2022   Procedure: ESOPHAGOGASTRODUODENOSCOPY (EGD);  Surgeon: Regis Bill, MD;  Location: [facility] ENDOSCOPY;  Service: Endoscopy;  Laterality: N/A;   FOOT SURGERY Left    FRACTURE SURGERY     IR CV LINE INJECTION  06/22/2022   IR IMAGING GUIDED PORT INSERTION  06/05/2022   IR PORT REPAIR CENTRAL VENOUS ACCESS DEVICE  07/02/2022   LOWER EXTREMITY ANGIOGRAPHY Left 02/04/2023    Procedure: Lower Extremity Angiography;  Surgeon: Annice Needy, MD;  Location: [facility] INVASIVE CV LAB;  Service: Cardiovascular;  Laterality: Left;   ORIF ANKLE FRACTURE Left 03/26/2017   Procedure: OPEN REDUCTION INTERNAL FIXATION (ORIF) ANKLE FRACTURE;  Surgeon: Christena Flake, MD;  Location: [facility] ORS;  Service: Orthopedics;  Laterality: Left;     Social History   Tobacco Use   Smoking status: Former    Current packs/day: 1.00    Average packs/day: 1 pack/day for 40.0 years (40.0 ttl pk-yrs)    Types: Cigarettes   Smokeless tobacco: Never   Tobacco comments:    Quite 2015  Vaping Use   Vaping status: Never Used  Substance Use Topics   Alcohol use: No   Drug use: No      Family History  Problem Relation Age of Onset   CVA Mother    Diabetes Mother    CAD Father    Breast cancer Neg Hx     No Known Allergies   REVIEW OF SYSTEMS (Negative unless checked)  Constitutional: [] Weight loss  [] Fever  [] Chills Cardiac: [] Chest pain   [] Chest pressure   [] Palpitations   [] Shortness of breath when laying flat   [] Shortness of breath at rest   [x] Shortness of breath with exertion. Vascular:  [x] Pain in legs with walking   [] Pain in legs at rest   [] Pain in legs when laying flat   [x] Claudication   [] Pain in feet when walking  [] Pain in feet at rest  [] Pain in feet when laying flat   [] History of DVT   [] Phlebitis   [] Swelling in legs   [] Varicose veins   [] Non-healing ulcers Pulmonary:   [] Uses home oxygen   [] Productive cough   [] Hemoptysis   [] Wheeze  [x] COPD   [] Asthma Neurologic:  [] Dizziness  [] Blackouts   [] Seizures   [] History of stroke   [] History of TIA  [] Aphasia   [] Temporary blindness   [] Dysphagia   [] Weakness or numbness in arms   [] Weakness or numbness in legs Musculoskeletal:  [x] Arthritis   [] Joint swelling   [] Joint pain   [] Low back pain Hematologic:  [] Easy bruising  [] Easy bleeding   [] Hypercoagulable state   [] Anemic   Gastrointestinal:  [] Blood in stool    [] Vomiting blood  [x] Gastroesophageal reflux/heartburn   [] Abdominal pain Genitourinary:  [] Chronic kidney disease   [] Difficult urination  [] Frequent urination  [] Burning with urination   [] Hematuria Skin:  [] Rashes   [] Ulcers   [] Wounds Psychological:  [x] History of anxiety   []  History of major depression.  Physical Examination  BP (!) 173/81 (BP Location: Right  Arm)   Pulse (!) 57   Resp 16   Wt 192 lb 9.6 oz (87.4 kg)   BMI 36.39 kg/m  Gen:  WD/WN, NAD Head: [place]/AT, No temporalis wasting. Ear/Nose/Throat: Hearing grossly intact, nares w/o erythema or drainage Eyes: Conjunctiva clear. Sclera non-icteric Neck: Supple.  Trachea midline Pulmonary:  Good air movement, no use of accessory muscles.  Cardiac: Somewhat irregular and bradycardic Vascular:  Vessel Right Left  Radial Palpable Palpable                          PT Palpable Palpable  DP Palpable Palpable   Gastrointestinal: soft, non-tender/non-distended. No guarding/reflex.  Musculoskeletal: M/S 5/5 throughout.  No deformity or atrophy.  No significant lower extremity edema. Neurologic: Sensation grossly intact in extremities.  Symmetrical.  Speech is fluent.  Psychiatric: Judgment intact, Mood & affect appropriate for pt's clinical situation. Dermatologic: No rashes or ulcers noted.  No cellulitis or open wounds.      Labs Recent Results (from the past 2160 hour(s))  CBC     Status: Abnormal   Collection Time: 12/19/22 10:59 AM  Result Value Ref Range   WBC 6.6 4.0 - 10.5 K/uL   RBC 4.47 3.87 - 5.11 MIL/uL   Hemoglobin 12.5 12.0 - 15.0 g/dL   HCT 45.4 09.8 - 11.9 %   MCV 86.8 80.0 - 100.0 fL   MCH 28.0 26.0 - 34.0 pg   MCHC 32.2 30.0 - 36.0 g/dL   RDW 14.7 82.9 - 56.2 %   Platelets 147 (L) 150 - 400 K/uL   nRBC 0.0 0.0 - 0.2 %    Comment: Performed at [facility], [address], [place], [place] [address]  Comprehensive metabolic panel     Status: Abnormal   Collection Time: 12/19/22  10:59 AM  Result Value Ref Range   Sodium 137 135 - 145 mmol/L   Potassium 3.8 3.5 - 5.1 mmol/L   Chloride 102 98 - 111 mmol/L   CO2 26 22 - 32 mmol/L   Glucose, Bld 176 (H) 70 - 99 mg/dL    Comment: Glucose reference range applies only to samples taken after fasting for at least 8 hours.   BUN 24 (H) 8 - 23 mg/dL   Creatinine, Ser 5.78 0.44 - 1.00 mg/dL   Calcium 8.8 (L) 8.9 - 10.3 mg/dL   Total Protein 6.9 6.5 - 8.1 g/dL   Albumin 3.9 3.5 - 5.0 g/dL   AST 17 15 - 41 U/L   ALT 17 0 - 44 U/L   Alkaline Phosphatase 69 38 - 126 U/L   Total Bilirubin 0.8 0.3 - 1.2 mg/dL   GFR, Estimated >46 >96 mL/min    Comment: (NOTE) Calculated using the CKD-EPI Creatinine Equation (2021)    Anion gap 9 5 - 15    Comment: Performed at [facility], [address] [place], [place] [address]  CBG monitoring, ED     Status: Abnormal   Collection Time: 12/24/22  4:03 PM  Result Value Ref Range   Glucose-Capillary 210 (H) 70 - 99 mg/dL    Comment: Glucose reference range applies only to samples taken after fasting for at least 8 hours.  CBG monitoring, ED     Status: Abnormal   Collection Time: 12/25/22 12:17 PM  Result Value Ref Range   Glucose-Capillary 363 (H) 70 - 99 mg/dL    Comment: Glucose reference range applies only to samples taken after  fasting for at least 8 hours.  Hemoglobin A1c     Status: Abnormal   Collection Time: 12/25/22 12:21 PM  Result Value Ref Range   Hgb A1c MFr Bld 7.2 (H) 4.8 - 5.6 %    Comment: (NOTE) Pre diabetes:          5.7%-6.4%  Diabetes:              >6.4%  Glycemic control for   <7.0% adults with diabetes    Mean Plasma Glucose 159.94 mg/dL    Comment: Performed at [facility], [address] [place], [place] [address]  CBG monitoring, ED     Status: Abnormal   Collection Time: 12/25/22  4:54 PM  Result Value Ref Range   Glucose-Capillary 247 (H) 70 - 99 mg/dL    Comment: Glucose reference range applies only to samples taken after  fasting for at least 8 hours.  CBG monitoring, ED     Status: Abnormal   Collection Time: 12/26/22  8:59 AM  Result Value Ref Range   Glucose-Capillary 233 (H) 70 - 99 mg/dL    Comment: Glucose reference range applies only to samples taken after fasting for at least 8 hours.  CBG monitoring, ED     Status: Abnormal   Collection Time: 12/26/22 11:30 AM  Result Value Ref Range   Glucose-Capillary 312 (H) 70 - 99 mg/dL    Comment: Glucose reference range applies only to samples taken after fasting for at least 8 hours.  Glucose, capillary     Status: Abnormal   Collection Time: 01/15/23  9:25 AM  Result Value Ref Range   Glucose-Capillary 175 (H) 70 - 99 mg/dL    Comment: Glucose reference range applies only to samples taken after fasting for at least 8 hours.  CBC with Differential (Cancer Center Only)     Status: Abnormal   Collection Time: 01/16/23  9:34 AM  Result Value Ref Range   WBC Count 4.3 4.0 - 10.5 K/uL   RBC 3.69 (L) 3.87 - 5.11 MIL/uL   Hemoglobin 10.3 (L) 12.0 - 15.0 g/dL   HCT 19.1 (L) 47.8 - 29.5 %   MCV 86.4 80.0 - 100.0 fL   MCH 27.9 26.0 - 34.0 pg   MCHC 32.3 30.0 - 36.0 g/dL   RDW 62.1 30.8 - 65.7 %   Platelet Count 121 (L) 150 - 400 K/uL   nRBC 0.0 0.0 - 0.2 %   Neutrophils Relative % 81 %   Neutro Abs 3.5 1.7 - 7.7 K/uL   Lymphocytes Relative 5 %   Lymphs Abs 0.2 (L) 0.7 - 4.0 K/uL   Monocytes Relative 10 %   Monocytes Absolute 0.4 0.1 - 1.0 K/uL   Eosinophils Relative 3 %   Eosinophils Absolute 0.1 0.0 - 0.5 K/uL   Basophils Relative 0 %   Basophils Absolute 0.0 0.0 - 0.1 K/uL   Immature Granulocytes 1 %   Abs Immature Granulocytes 0.02 0.00 - 0.07 K/uL    Comment: Performed at [facility], [address] [place], [place] [address]  CMP (Cancer Center only)     Status: Abnormal   Collection Time: 01/16/23  9:34 AM  Result Value Ref Range   Sodium 135 135 - 145 mmol/L   Potassium 3.9 3.5 - 5.1 mmol/L   Chloride 100 98 - 111 mmol/L   CO2 30  22 - 32 mmol/L   Glucose, Bld 260 (H) 70 - 99 mg/dL  Comment: Glucose reference range applies only to samples taken after fasting for at least 8 hours.   BUN 20 8 - 23 mg/dL   Creatinine 4.09 8.11 - 1.00 mg/dL   Calcium 8.4 (L) 8.9 - 10.3 mg/dL   Total Protein 5.9 (L) 6.5 - 8.1 g/dL   Albumin 3.2 (L) 3.5 - 5.0 g/dL   AST 14 (L) 15 - 41 U/L   ALT 12 0 - 44 U/L   Alkaline Phosphatase 75 38 - 126 U/L   Total Bilirubin 0.5 0.3 - 1.2 mg/dL   GFR, Estimated >91 >47 mL/min    Comment: (NOTE) Calculated using the CKD-EPI Creatinine Equation (2021)    Anion gap 5 5 - 15    Comment: Performed at [facility], [address] [place], [place] [address]  TSH     Status: None   Collection Time: 01/16/23  9:34 AM  Result Value Ref Range   TSH 1.714 0.350 - 4.500 uIU/mL    Comment: Performed by a 3rd Generation assay with a functional sensitivity of <=0.01 uIU/mL. Performed at [facility], [address] [place], [place] [address]   T4     Status: None   Collection Time: 01/16/23  9:34 AM  Result Value Ref Range   T4, Total 7.9 4.5 - 12.0 ug/dL    Comment: (NOTE) Performed At: [facility] [address] [place], [place] [id number] Jolene Schimke MD [id number]   IGP, Aptima HPV     Status: Abnormal   Collection Time: 01/16/23 11:20 AM  Result Value Ref Range   Interpretation EPCA,AGCB,SRFIG (A)     Comment: EPITHELIAL CELL ABNORMALITY. ATYPICAL GLANDULAR CELLS (AGC), FAVOR NEOPLASTIC. SPECIMEN REPROCESSED FOR INTERPRETATION USING GLACIAL ACETIC ACID (GAA).    Category AGUS (A)     Comment: Atypical Glandular Cells of Undetermined Significance   Recommendation FSN (A)     Comment: Further studies are indicated.   Adequacy SECNI     Comment: Satisfactory for evaluation. No endocervical component is identified.   Performed by: Comment     Comment: Mikey Kirschner, Cytotechnologist (ASCP)   Electronically signed by: Comment     Comment: Rod Mae, MD,  Pathologist   PATHOLOGIST PROVIDED ICD10: Comment     Comment: U27.253   Note: Comment     Comment: The Pap smear is a screening test designed to aid in the detection of premalignant and malignant conditions of the uterine cervix.  It is not a diagnostic procedure and should not be used as the sole means of detecting cervical cancer.  Both false-positive and false-negative reports do occur.    Test Methodology CANCELED     Comment: The Thin Prep(R) Imager was unable to read this specimen.  Therefore a manual review was performed.  Result canceled by the ancillary.    HPV Aptima Negative Negative    Comment: This nucleic acid amplification test detects fourteen high-risk HPV types (16,18,31,33,35,39,45,51,52,56,58,59,66,68) without differentiation.   VAS [place] ABI WITH/WO TBI     Status: None   Collection Time: 01/18/23  1:27 PM  Result Value Ref Range   Right ABI 0.82    Left ABI 0.72   Lipid panel     Status: None   Collection Time: 01/24/23 10:19 AM  Result Value Ref Range   Cholesterol 156 0 - 200 mg/dL    Comment: ATP III Classification       Desirable:  < 200 mg/dL  Borderline High:  200 - 239 mg/dL          High:  > = 829 mg/dL   Triglycerides 562.1 0.0 - 149.0 mg/dL    Comment: Normal:  <308 mg/dLBorderline High:  150 - 199 mg/dL   HDL 65.78 >46.96 mg/dL   VLDL 29.5 0.0 - 28.4 mg/dL   LDL Cholesterol 87 0 - 99 mg/dL   Total CHOL/HDL Ratio 3     Comment:                Men          Women1/2 Average Risk     3.4          3.3Average Risk          5.0          4.42X Average Risk          9.6          7.13X Average Risk          15.0          11.0                       NonHDL 109.31     Comment: NOTE:  Non-HDL goal should be 30 mg/dL higher than patient's LDL goal (i.e. LDL goal of < 70 mg/dL, would have non-HDL goal of < 100 mg/dL)  Comprehensive metabolic panel     Status: Abnormal   Collection Time: 01/24/23 10:19 AM  Result Value Ref Range   Sodium 140 135 -  145 mEq/L   Potassium 4.4 3.5 - 5.1 mEq/L   Chloride 103 96 - 112 mEq/L   CO2 32 19 - 32 mEq/L   Glucose, Bld 179 (H) 70 - 99 mg/dL   BUN 32 (H) 6 - 23 mg/dL   Creatinine, Ser 1.32 0.40 - 1.20 mg/dL   Total Bilirubin 0.5 0.2 - 1.2 mg/dL   Alkaline Phosphatase 82 39 - 117 U/L   AST 12 0 - 37 U/L   ALT 9 0 - 35 U/L   Total Protein 6.2 6.0 - 8.3 g/dL   Albumin 3.5 3.5 - 5.2 g/dL   GFR 44.01 (L) >02.72 mL/min    Comment: Calculated using the CKD-EPI Creatinine Equation (2021)   Calcium 9.1 8.4 - 10.5 mg/dL  CBC with Differential/Platelet     Status: Abnormal   Collection Time: 01/24/23 10:19 AM  Result Value Ref Range   WBC 5.2 4.0 - 10.5 K/uL   RBC 4.07 3.87 - 5.11 Mil/uL   Hemoglobin 11.2 (L) 12.0 - 15.0 g/dL   HCT 53.6 (L) 64.4 - 03.4 %   MCV 84.4 78.0 - 100.0 fl   MCHC 32.6 30.0 - 36.0 g/dL   RDW 74.2 59.5 - 63.8 %   Platelets 171.0 150.0 - 400.0 K/uL   Neutrophils Relative % 82.9 (H) 43.0 - 77.0 %   Lymphocytes Relative 5.6 Repeated and verified X2. (L) 12.0 - 46.0 %   Monocytes Relative 8.1 3.0 - 12.0 %   Eosinophils Relative 3.1 0.0 - 5.0 %   Basophils Relative 0.3 0.0 - 3.0 %   Neutro Abs 4.3 1.4 - 7.7 K/uL   Lymphs Abs 0.3 (L) 0.7 - 4.0 K/uL   Monocytes Absolute 0.4 0.1 - 1.0 K/uL   Eosinophils Absolute 0.2 0.0 - 0.7 K/uL   Basophils Absolute 0.0 0.0 - 0.1 K/uL  Brain natriuretic peptide     Status: Abnormal  Collection Time: 01/24/23 10:19 AM  Result Value Ref Range   Pro B Natriuretic peptide (BNP) 163.0 (H) 0.0 - 100.0 pg/mL  VITAMIN D 25 Hydroxy (Vit-D Deficiency, Fractures)     Status: None   Collection Time: 01/24/23 10:19 AM  Result Value Ref Range   VITD 39.95 30.00 - 100.00 ng/mL  Vitamin B12     Status: Abnormal   Collection Time: 01/24/23 10:19 AM  Result Value Ref Range   Vitamin B-12 175 (L) 211 - 911 pg/mL  TSH     Status: None   Collection Time: 01/24/23 10:19 AM  Result Value Ref Range   TSH 1.18 0.35 - 5.50 uIU/mL  Microalbumin / creatinine  urine ratio     Status: Abnormal   Collection Time: 01/24/23 10:19 AM  Result Value Ref Range   Microalb, Ur 12.3 (H) 0.0 - 1.9 mg/dL   Creatinine,U 811.9 mg/dL   Microalb Creat Ratio 10.4 0.0 - 30.0 mg/g  Hepatitis C antibody     Status: None   Collection Time: 01/24/23 10:19 AM  Result Value Ref Range   Hepatitis C Ab NON-REACTIVE NON-REACTIVE    Comment: . HCV antibody was non-reactive. There is no laboratory  evidence of HCV infection. . In most cases, no further action is required. However, if recent HCV exposure is suspected, a test for HCV RNA (test code 14782) is suggested. . For additional information please refer to http://education.questdiagnostics.com/faq/FAQ22v1 (This link is being provided for informational/ educational purposes only.) .   BUN     Status: None   Collection Time: 02/04/23  7:13 AM  Result Value Ref Range   BUN 22 8 - 23 mg/dL    Comment: Performed at [facility], [address] [place], [place] [address]  Creatinine, serum     Status: None   Collection Time: 02/04/23  7:13 AM  Result Value Ref Range   Creatinine, Ser 0.90 0.44 - 1.00 mg/dL   GFR, Estimated >30 >86 mL/min    Comment: (NOTE) Calculated using the CKD-EPI Creatinine Equation (2021) Performed at [facility], [address] [place], [place] [address]   Glucose, capillary     Status: Abnormal   Collection Time: 02/04/23  7:18 AM  Result Value Ref Range   Glucose-Capillary 129 (H) 70 - 99 mg/dL    Comment: Glucose reference range applies only to samples taken after fasting for at least 8 hours.  CBC with Differential/Platelet     Status: Abnormal   Collection Time: 02/06/23  9:15 AM  Result Value Ref Range   WBC 7.7 4.0 - 10.5 K/uL   RBC 3.74 (L) 3.87 - 5.11 MIL/uL   Hemoglobin 10.2 (L) 12.0 - 15.0 g/dL   HCT 96.2 (L) 95.2 - 84.1 %   MCV 85.3 80.0 - 100.0 fL   MCH 27.3 26.0 - 34.0 pg   MCHC 32.0 30.0 - 36.0 g/dL   RDW 32.4 40.1 - 02.7 %   Platelets  149 (L) 150 - 400 K/uL   nRBC 0.0 0.0 - 0.2 %   Neutrophils Relative % 85 %   Neutro Abs 6.5 1.7 - 7.7 K/uL   Lymphocytes Relative 4 %   Lymphs Abs 0.3 (L) 0.7 - 4.0 K/uL   Monocytes Relative 8 %   Monocytes Absolute 0.7 0.1 - 1.0 K/uL   Eosinophils Relative 2 %   Eosinophils Absolute 0.1 0.0 - 0.5 K/uL   Basophils Relative 0 %   Basophils Absolute 0.0 0.0 -  0.1 K/uL   Immature Granulocytes 1 %   Abs Immature Granulocytes 0.06 0.00 - 0.07 K/uL    Comment: Performed at [facility], [address] [place], [place] [address]  CMP (Cancer Center only)     Status: Abnormal   Collection Time: 02/06/23  9:15 AM  Result Value Ref Range   Sodium 135 135 - 145 mmol/L   Potassium 3.8 3.5 - 5.1 mmol/L   Chloride 100 98 - 111 mmol/L   CO2 27 22 - 32 mmol/L   Glucose, Bld 308 (H) 70 - 99 mg/dL    Comment: Glucose reference range applies only to samples taken after fasting for at least 8 hours.   BUN 18 8 - 23 mg/dL   Creatinine 2.84 1.32 - 1.00 mg/dL   Calcium 8.3 (L) 8.9 - 10.3 mg/dL   Total Protein 6.0 (L) 6.5 - 8.1 g/dL   Albumin 3.3 (L) 3.5 - 5.0 g/dL   AST 19 15 - 41 U/L   ALT 13 0 - 44 U/L   Alkaline Phosphatase 76 38 - 126 U/L   Total Bilirubin 0.7 0.3 - 1.2 mg/dL   GFR, Estimated >44 >01 mL/min    Comment: (NOTE) Calculated using the CKD-EPI Creatinine Equation (2021)    Anion gap 8 5 - 15    Comment: Performed at [facility], [address] [place], [place] [address]  Surgical pathology     Status: None   Collection Time: 02/27/23 12:00 AM  Result Value Ref Range   SURGICAL PATHOLOGY      SURGICAL PATHOLOGY [facility] [address] [place], [place] [address] Telephone [phone] or [phone] Fax [phone]  REPORT OF SURGICAL PATHOLOGY   Accession #: [id number] Patient Name: ERNESTENE, NUCCI Visit # : [id number]  MRN: [id number] Physician: Leida Lauth DOB/Age [birth year] (Age: 9) Gender: F Collected  Date: 02/27/2023 Received Date: 02/27/2023  FINAL DIAGNOSIS       1. Cervix, biopsy,  :       - INVOLVEMENT BY PATIENT'S KNOWN ENDOMETRIAL CARCINOMA.      - THERE IS SUFFICIENT MATERIAL FOR ANCILLARY MOLECULAR TESTING (BLOCK 1A).       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      DATE SIGNED OUT: 02/28/2023 ELECTRONIC SIGNATURE : Oneita Kras Md, Delice Bison , Pathologist, Electronic Signature  MICROSCOPIC DESCRIPTION  CASE COMMENTS STAINS USED IN DIAGNOSIS: H&E H&E-2 H&E-3    CLINICAL HIS TORY  SPECIMEN(S) OBTAINED 1. Cervix, biopsy,  SPECIMEN COMMENTS: 1. lesion SPECIMEN CLINICAL INFORMATION:    Gross Description 1. Received in formalin, labeled "cervix", is a 1.8 x 0.9 x 0.3 cm unoriented fragment of pink-gray rubbery soft tissue submitted entirely in block 1A.      SMB      02/27/2023        Report signed out from the following location(s) [place] [address] [place], [place] [address] CLIA #: [id number]  [facility] [address] [place], [place] [address] CLIA #: [id number]   TSH     Status: None   Collection Time: 02/27/23  8:41 AM  Result Value Ref Range   TSH 3.460 0.350 - 4.500 uIU/mL    Comment: Performed by a 3rd Generation assay with a functional sensitivity of <=0.01 uIU/mL. Performed at [facility], [address] [place], [place] [address]   T4     Status: None   Collection Time: 02/27/23  8:41 AM  Result Value Ref Range   T4, Total 7.5 4.5 - 12.0 ug/dL    Comment: (NOTE) Performed At: [facility] [address] [place], [place] [id number] Jolene Schimke MD [id number]   CMP (Cancer Center only)     Status: Abnormal   Collection Time: 02/27/23  8:41 AM  Result Value Ref Range   Sodium 138 135 - 145 mmol/L   Potassium 3.9 3.5 - 5.1 mmol/L   Chloride 104 98 -  111 mmol/L   CO2 29 22 - 32 mmol/L   Glucose, Bld 151 (H) 70 - 99 mg/dL    Comment: Glucose reference range applies only to samples taken after fasting for at least 8 hours.   BUN 22 8 - 23 mg/dL   Creatinine 7.56 4.33 - 1.00 mg/dL   Calcium 8.5 (L) 8.9 - 10.3 mg/dL   Total Protein 5.7 (L) 6.5 - 8.1 g/dL   Albumin 3.3 (L) 3.5 - 5.0 g/dL   AST 16 15 - 41 U/L   ALT 13 0 - 44 U/L   Alkaline Phosphatase 103 38 - 126 U/L   Total Bilirubin 0.5 0.3 - 1.2 mg/dL   GFR, Estimated >29 >51 mL/min    Comment: (NOTE) Calculated using the CKD-EPI Creatinine Equation (2021)    Anion gap 5 5 - 15    Comment: Performed at [facility], [address] [place], [place] [address]  CBC with Differential (Cancer Center Only)     Status: Abnormal   Collection Time: 02/27/23  8:41 AM  Result Value Ref Range   WBC Count 3.7 (L) 4.0 - 10.5 K/uL   RBC 3.73 (L) 3.87 - 5.11 MIL/uL   Hemoglobin 10.2 (L) 12.0 - 15.0 g/dL   HCT 60.6 (L) 30.1 - 60.1 %   MCV 86.3 80.0 - 100.0 fL   MCH 27.3 26.0 - 34.0 pg   MCHC 31.7 30.0 - 36.0 g/dL   RDW 09.3 23.5 - 57.3 %   Platelet Count 128 (L) 150 - 400 K/uL   nRBC 0.0 0.0 - 0.2 %   Neutrophils Relative % 72 %   Neutro Abs 2.7 1.7 - 7.7 K/uL   Lymphocytes Relative 8 %   Lymphs Abs 0.3 (L) 0.7 - 4.0 K/uL   Monocytes Relative 13 %   Monocytes Absolute 0.5 0.1 - 1.0 K/uL   Eosinophils Relative 5 %   Eosinophils Absolute 0.2 0.0 - 0.5 K/uL   Basophils Relative 1 %   Basophils Absolute 0.0 0.0 - 0.1 K/uL   Immature Granulocytes 1 %   Abs Immature Granulocytes 0.02 0.00 - 0.07 K/uL    Comment: Performed at [facility], [address] [place], [place] [address]  VAS [place] ABI WITH/WO TBI     Status: None   Collection Time: 03/04/23  3:13 PM  Result Value Ref Range   Right ABI .87    Left ABI .94     Radiology VAS [place] ABI WITH/WO TBI  Result Date: 03/08/2023  LOWER EXTREMITY DOPPLER STUDY Patient Name:  Terri [name]  Date of Exam:   03/04/2023 Medical Rec  #: [id number]    Accession #:    [id number] Date of Birth: [birth year]    Patient Gender: F Patient Age:   75 years Exam Location:  [facility] Procedure:      VAS [place] ABI WITH/WO  TBI Referring Phys: Festus Barren --------------------------------------------------------------------------------  Indications: Claudication, and peripheral artery disease. High Risk Factors: Hypertension, hyperlipidemia. Other Factors: Over 10 years bilateral calf claudication.   Vascular Interventions: 02/24/2023: Aortogram and Selective Left Lower Extremity                         Angiogram. PTA of the Left SFA with 5 mm diameter by 15                         cm Length Lutonix drug coated angioplasty balloon. Stent                         placement to the Left SFA with 6 mm diameter by 12 cm                         Length Life stent. PTA of the Left EIA and Proximal CFA                         with 6 mm diameter by 12 cm length Lutonix drug coated                         angioplasty. Stent placement to the Right CIA with 9 mm                         diameter by 38 mm length Lifestream Stent. Comparison Study: 01/18/2023 Performing Technologist: Debbe Bales RVS  Examination Guidelines: A complete evaluation includes at minimum, Doppler waveform signals and systolic blood pressure reading at the level of bilateral brachial, anterior tibial, and posterior tibial arteries, when vessel segments are accessible. Bilateral testing is considered an integral part of a complete examination. Photoelectric Plethysmograph (PPG) waveforms and toe systolic pressure readings are included as required and additional duplex testing as needed. Limited examinations for reoccurring indications may be performed as noted.  ABI Findings: +---------+------------------+-----+--------+--------+ Right    Rt Pressure (mmHg)IndexWaveformComment  +---------+------------------+-----+--------+--------+ Brachial 217                                      +---------+------------------+-----+--------+--------+ ATA      187               0.86 biphasic         +---------+------------------+-----+--------+--------+ PTA      189               0.87 biphasic         +---------+------------------+-----+--------+--------+ Great Toe216               1.00 Normal           +---------+------------------+-----+--------+--------+ +---------+------------------+-----+--------+-------+ Left     Lt Pressure (mmHg)IndexWaveformComment +---------+------------------+-----+--------+-------+ Brachial 217                                    +---------+------------------+-----+--------+-------+ ATA      204               0.94 biphasic        +---------+------------------+-----+--------+-------+ PTA      203  0.94 biphasic        +---------+------------------+-----+--------+-------+ Great Toe177               0.82 Normal          +---------+------------------+-----+--------+-------+ +-------+-----------+-----------+------------+------------+ ABI/TBIToday's ABIToday's TBIPrevious ABIPrevious TBI +-------+-----------+-----------+------------+------------+ Right  .87        1.00       .82         .69          +-------+-----------+-----------+------------+------------+ Left   .94        .82        .72         .63          +-------+-----------+-----------+------------+------------+ Bilateral ABIs appear increased compared to prior study on 01/18/2023. Bilateral TBIs appear increased compared to prior study on 01/18/2023.  Summary: Right: Resting right ankle-brachial index indicates mild right lower extremity arterial disease. The right toe-brachial index is normal. Left: Resting left ankle-brachial index indicates mild left lower extremity arterial disease. The left toe-brachial index is normal. *See table(s) above for measurements and observations.  Electronically signed by Festus Barren MD on 03/08/2023 at 9:00:06 AM.     Final     Assessment/Plan  Atherosclerosis of native arteries of extremity with intermittent claudication (HCC) ABIs done last week are 0.87 on the right and 0.94 on the left but both the digital pressures and waveforms were in the normal range.  Symptoms are improved after bilateral lower extremity revascularization several weeks ago.  Originally, there was some plan to perform intervention on the right leg but since we did iliac intervention and her flow responded, I think this can be put on hold for now and we can monitor this.  Follow-up in 3 months with noninvasive studies.  Essential hypertension blood pressure control important in reducing the progression of atherosclerotic disease. On appropriate oral medications.   Type 2 diabetes mellitus with complication, with long-term current use of insulin (HCC) blood glucose control important in reducing the progression of atherosclerotic disease. Also, involved in wound healing. On appropriate medications.   Hyperlipemia, mixed lipid control important in reducing the progression of atherosclerotic disease. Continue statin therapy    Festus Barren, MD  03/12/2023 2:25 PM    This note was created with Dragon medical transcription system.  Any errors from dictation are purely unintentional

## 2023-03-12 NOTE — Assessment & Plan Note (Signed)
lipid control important in reducing the progression of atherosclerotic disease. Continue statin therapy  

## 2023-03-12 NOTE — Assessment & Plan Note (Signed)
blood pressure control important in reducing the progression of atherosclerotic disease. On appropriate oral medications.  

## 2023-03-20 ENCOUNTER — Ambulatory Visit: Payer: PPO

## 2023-03-20 ENCOUNTER — Inpatient Hospital Stay: Payer: PPO

## 2023-03-21 ENCOUNTER — Encounter: Payer: Self-pay | Admitting: *Deleted

## 2023-03-22 ENCOUNTER — Inpatient Hospital Stay: Payer: PPO

## 2023-03-22 ENCOUNTER — Inpatient Hospital Stay: Payer: PPO | Admitting: Oncology

## 2023-03-25 ENCOUNTER — Encounter: Payer: Self-pay | Admitting: Oncology

## 2023-03-25 ENCOUNTER — Inpatient Hospital Stay: Payer: PPO | Attending: Obstetrics and Gynecology

## 2023-03-25 ENCOUNTER — Inpatient Hospital Stay (HOSPITAL_BASED_OUTPATIENT_CLINIC_OR_DEPARTMENT_OTHER): Payer: PPO | Admitting: Oncology

## 2023-03-25 VITALS — BP 171/63 | HR 72 | Temp 96.4°F | Wt 184.0 lb

## 2023-03-25 DIAGNOSIS — Z2989 Encounter for other specified prophylactic measures: Secondary | ICD-10-CM

## 2023-03-25 DIAGNOSIS — J441 Chronic obstructive pulmonary disease with (acute) exacerbation: Secondary | ICD-10-CM

## 2023-03-25 DIAGNOSIS — C7982 Secondary malignant neoplasm of genital organs: Secondary | ICD-10-CM | POA: Insufficient documentation

## 2023-03-25 DIAGNOSIS — E1165 Type 2 diabetes mellitus with hyperglycemia: Secondary | ICD-10-CM | POA: Diagnosis not present

## 2023-03-25 DIAGNOSIS — D649 Anemia, unspecified: Secondary | ICD-10-CM | POA: Insufficient documentation

## 2023-03-25 DIAGNOSIS — C541 Malignant neoplasm of endometrium: Secondary | ICD-10-CM

## 2023-03-25 DIAGNOSIS — R0602 Shortness of breath: Secondary | ICD-10-CM

## 2023-03-25 NOTE — Progress Notes (Signed)
DISCONTINUE OFF PATHWAY REGIMEN - Uterine   [id number]:Pembrolizumab 200 mg IV D1 q21 Days:   A cycle is every 21 days:     Pembrolizumab   **Always confirm dose/schedule in your pharmacy ordering system**  REASON: Disease Progression PRIOR TREATMENT: Off Pathway: Pembrolizumab 200 mg IV D1 q21 Days TREATMENT RESPONSE: Progressive Disease (PD)  START ON PATHWAY REGIMEN - Uterine     A cycle is every 21 days:     Doxorubicin   **Always confirm dose/schedule in your pharmacy ordering system**  Patient Characteristics: Serous Carcinoma, Recurrent/Progressive Disease, Second Line, Relapse < 12 Months From Prior Therapy Histology: Serous Carcinoma Therapeutic Status: Recurrent or Progressive Disease Line of Therapy: Second Line Time to Recurrence: Relapse < 12 Months From Prior Therapy Intent of Therapy: Non-Curative / Palliative Intent, Discussed with Patient

## 2023-03-25 NOTE — Progress Notes (Signed)
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  INTERVAL HISTORY: Patient returns to clinic today for further evaluation and treatment planning.  She was seen by gynecology oncology who determined that surgical intervention is not an option at this point and have recommended single agent Doxil.  She currently feels well.  She has no neurologic complaints.  She denies any recent fevers or illnesses.  She has a good appetite and denies weight loss.  She has no chest pain, shortness of breath, cough, or hemoptysis.  She denies any nausea, vomiting, constipation, or diarrhea.  She has no urinary complaints.  Patient offers no specific complaints today.  REVIEW OF SYSTEMS:   Review of Systems  Constitutional: Negative.  Negative for fever, malaise/fatigue and weight loss.  Respiratory: Negative.  Negative for cough, hemoptysis and shortness of breath.   Cardiovascular: Negative.  Negative for chest pain and leg swelling.  Gastrointestinal: Negative.  Negative for abdominal pain, blood in stool and melena.  Genitourinary: Negative.  Negative for dysuria.  Musculoskeletal: Negative.  Negative for back pain and falls.  Skin: Negative.  Negative for rash.  Neurological: Negative.  Negative for dizziness, focal weakness, weakness and headaches.  Psychiatric/Behavioral: Negative.  The patient is not nervous/anxious.     As per HPI. Otherwise, a complete review of systems is negative.  PAST MEDICAL HISTORY: Past Medical History:  Diagnosis Date   Adenomatous colon polyp    Aortic stenosis    Arthritis    Asthma    Claudication (HCC)     COPD (chronic obstructive pulmonary disease) (HCC)    Depression    Diabetes mellitus without complication (HCC)    Esophageal dysphagia    GERD (gastroesophageal reflux disease)    Hyperlipemia    Hypertension    Obesity    Severe obesity (BMI 35.0-35.9 with comorbidity) (HCC)     PAST SURGICAL HISTORY: Past Surgical History:  Procedure Laterality Date   BLADDER SURGERY     COLONOSCOPY WITH PROPOFOL N/A 08/05/2015   Procedure: COLONOSCOPY WITH PROPOFOL;  Surgeon: Elnita Maxwell, MD;  Location: [facility] ENDOSCOPY;  Service: Endoscopy;  Laterality: N/A;   ESOPHAGOGASTRODUODENOSCOPY N/A 01/09/2022   Procedure: ESOPHAGOGASTRODUODENOSCOPY (EGD);  Surgeon: Regis Bill, MD;  Location: [facility] ENDOSCOPY;  Service: Endoscopy;  Laterality: N/A;   FOOT SURGERY Left    FRACTURE SURGERY     IR CV LINE INJECTION  06/22/2022   IR IMAGING GUIDED PORT INSERTION  06/05/2022   IR PORT REPAIR CENTRAL VENOUS ACCESS DEVICE  07/02/2022   LOWER EXTREMITY ANGIOGRAPHY Left 02/04/2023   Procedure: Lower Extremity Angiography;  Surgeon: Annice Needy, MD;  Location: [facility] INVASIVE CV LAB;  Service: Cardiovascular;  Laterality: Left;   ORIF ANKLE FRACTURE Left 03/26/2017   Procedure: OPEN REDUCTION INTERNAL FIXATION (ORIF) ANKLE FRACTURE;  Surgeon: Christena Flake, MD;  Location: [facility] ORS;  Service: Orthopedics;  Laterality: Left;    FAMILY HISTORY: Family History  Problem Relation Age of Onset   CVA Mother    Diabetes Mother    CAD Father    Breast cancer Neg Hx     ADVANCED DIRECTIVES (Y/N):  N  HEALTH MAINTENANCE: Social History   Tobacco Use  Smoking status: Former    Current packs/day: 1.00    Average packs/day: 1 pack/day for 40.0 years (40.0 ttl pk-yrs)    Types: Cigarettes   Smokeless tobacco: Never   Tobacco comments:    Quite 2015  Vaping Use   Vaping status: Never Used  Substance Use Topics   Alcohol use: No   Drug use: No     Colonoscopy:  PAP:  Bone density:  Lipid  panel:  No Known Allergies  Current Outpatient Medications  Medication Sig Dispense Refill   albuterol (PROVENTIL HFA;VENTOLIN HFA) 108 (90 Base) MCG/ACT inhaler Inhale 2 puffs into the lungs every 6 (six) hours as needed for wheezing or shortness of breath.     amLODipine (NORVASC) 10 MG tablet Take 10 mg by mouth daily.     aspirin EC 81 MG tablet Take 1 tablet (81 mg total) by mouth daily. Swallow whole. 150 tablet 2   atorvastatin (LIPITOR) 80 MG tablet Take 80 mg by mouth daily.     buPROPion (WELLBUTRIN XL) 300 MG 24 hr tablet Take 300 mg by mouth daily.     Cholecalciferol (VITAMIN D3) 50 MCG (2000 UT) capsule Take 2,000 Units by mouth daily.     citalopram (CELEXA) 20 MG tablet Take 20 mg by mouth daily.     cloNIDine (CATAPRES) 0.2 MG tablet Take 0.2 mg by mouth 2 (two) times daily.     clopidogrel (PLAVIX) 75 MG tablet Take 1 tablet (75 mg total) by mouth daily. 90 tablet 3   cyanocobalamin (VITAMIN B12) 1000 MCG tablet Take 1 tablet (1,000 mcg total) by mouth daily. 90 tablet 3   glipiZIDE (GLUCOTROL XL) 10 MG 24 hr tablet Take 1 tablet (10 mg total) by mouth daily. Pt dropped bottle and is aware that insurance may not pay yet. May need cash pricing or coupon. Thank you. 30 tablet 0   insulin aspart (NOVOLOG) 100 UNIT/ML injection Inject 13 Units into the skin 3 (three) times daily before meals.     insulin detemir (LEVEMIR FLEXPEN) 100 UNIT/ML FlexPen Inject 25 units in the morning. Inject 23 units in the evening.     Iron, Ferrous Sulfate, 325 (65 Fe) MG TABS Take 325 mg by mouth every other day. 90 tablet 3   losartan-hydrochlorothiazide (HYZAAR) 100-25 MG tablet Take 1 tablet by mouth daily.     metoprolol succinate (TOPROL-XL) 50 MG 24 hr tablet Take 1 tablet by mouth daily.     montelukast (SINGULAIR) 10 MG tablet Take 10 mg by mouth at bedtime.     ONETOUCH VERIO test strip 1 each 3 (three) times daily.     Semaglutide,0.25 or 0.5MG /DOS, 2 MG/3ML SOPN Inject 0.25 mg into the  skin once a week.     No current facility-administered medications for this visit.    OBJECTIVE: Vitals:   03/25/23 1015  BP: (!) 171/63  Pulse: 72  Temp: (!) 96.4 F (35.8 C)  SpO2: 97%     Body mass index is 34.77 kg/m.    ECOG FS:0 - Asymptomatic  General: Well-developed, well-nourished, no acute distress. Eyes: Pink conjunctiva, anicteric sclera. HEENT: Normocephalic, moist mucous membranes. Lungs: No audible wheezing or coughing. Heart: Regular rate and rhythm. Abdomen: Soft, nontender, no obvious distention. Musculoskeletal: No edema, cyanosis, or clubbing. Neuro: Alert, answering all questions appropriately. Cranial nerves grossly intact. Skin: No rashes or petechiae noted. Psych: Normal affect.  LAB RESULTS:  Lab Results  Component Value Date   NA 138 02/27/2023  K 3.9 02/27/2023   CL 104 02/27/2023   CO2 29 02/27/2023   GLUCOSE 151 (H) 02/27/2023   BUN 22 02/27/2023   CREATININE 0.92 02/27/2023   CALCIUM 8.5 (L) 02/27/2023   PROT 5.7 (L) 02/27/2023   ALBUMIN 3.3 (L) 02/27/2023   AST 16 02/27/2023   ALT 13 02/27/2023   ALKPHOS 103 02/27/2023   BILITOT 0.5 02/27/2023   GFRNONAA >60 02/27/2023   GFRAA >60 03/27/2017    Lab Results  Component Value Date   WBC 3.7 (L) 02/27/2023   NEUTROABS 2.7 02/27/2023   HGB 10.2 (L) 02/27/2023   HCT 32.2 (L) 02/27/2023   MCV 86.3 02/27/2023   PLT 128 (L) 02/27/2023     STUDIES: VAS [place] ABI WITH/WO TBI  Result Date: 03/08/2023  LOWER EXTREMITY DOPPLER STUDY Patient Name:  Rosslynn Tennis  Date of Exam:   03/04/2023 Medical Rec #: [id number]    Accession #:    [id number] Date of Birth: June 28, 1950    Patient Gender: F Patient Age:   72 years Exam Location:  [facility] Procedure:      VAS [place] ABI WITH/WO TBI Referring Phys: Festus Barren --------------------------------------------------------------------------------  Indications: Claudication, and peripheral artery disease. High Risk Factors: Hypertension,  hyperlipidemia. Other Factors: Over 10 years bilateral calf claudication.   Vascular Interventions: 02/24/2023: Aortogram and Selective Left Lower Extremity                         Angiogram. PTA of the Left SFA with 5 mm diameter by 15                         cm Length Lutonix drug coated angioplasty balloon. Stent                         placement to the Left SFA with 6 mm diameter by 12 cm                         Length Life stent. PTA of the Left EIA and Proximal CFA                         with 6 mm diameter by 12 cm length Lutonix drug coated                         angioplasty. Stent placement to the Right CIA with 9 mm                         diameter by 38 mm length Lifestream Stent. Comparison Study: 01/18/2023 Performing Technologist: Debbe Bales RVS  Examination Guidelines: A complete evaluation includes at minimum, Doppler waveform signals and systolic blood pressure reading at the level of bilateral brachial, anterior tibial, and posterior tibial arteries, when vessel segments are accessible. Bilateral testing is considered an integral part of a complete examination. Photoelectric Plethysmograph (PPG) waveforms and toe systolic pressure readings are included as required and additional duplex testing as needed. Limited examinations for reoccurring indications may be performed as noted.  ABI Findings: +---------+------------------+-----+--------+--------+ Right    Rt Pressure (mmHg)IndexWaveformComment  +---------+------------------+-----+--------+--------+ Brachial 217                                     +---------+------------------+-----+--------+--------+  ATA      187               0.86 biphasic         +---------+------------------+-----+--------+--------+ PTA      189               0.87 biphasic         +---------+------------------+-----+--------+--------+ Great Toe216               1.00 Normal           +---------+------------------+-----+--------+--------+  +---------+------------------+-----+--------+-------+ Left     Lt Pressure (mmHg)IndexWaveformComment +---------+------------------+-----+--------+-------+ Brachial 217                                    +---------+------------------+-----+--------+-------+ ATA      204               0.94 biphasic        +---------+------------------+-----+--------+-------+ PTA      203               0.94 biphasic        +---------+------------------+-----+--------+-------+ Great Toe177               0.82 Normal          +---------+------------------+-----+--------+-------+ +-------+-----------+-----------+------------+------------+ ABI/TBIToday's ABIToday's TBIPrevious ABIPrevious TBI +-------+-----------+-----------+------------+------------+ Right  .87        1.00       .82         .69          +-------+-----------+-----------+------------+------------+ Left   .94        .82        .72         .63          +-------+-----------+-----------+------------+------------+ Bilateral ABIs appear increased compared to prior study on 01/18/2023. Bilateral TBIs appear increased compared to prior study on 01/18/2023.  Summary: Right: Resting right ankle-brachial index indicates mild right lower extremity arterial disease. The right toe-brachial index is normal. Left: Resting left ankle-brachial index indicates mild left lower extremity arterial disease. The left toe-brachial index is normal. *See table(s) above for measurements and observations.  Electronically signed by Festus Barren MD on 03/08/2023 at 9:00:06 AM.    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  CT scan results from October 08, 2022 reviewed independently with no obvious evidence of  progressive disease. She completed 6 cycles of carboplatinum, Taxol, and Keytruda on October 03, 2022.  Patient then initiated maintenance Keytruda on October 23, 2022.  PET scan results from January 15, 2023 reviewed independently with mild nonspecific residual hypermetabolism in the lower uterine segment as well as small bilateral common iliac nodes possibly suggesting nodal metastasis.  Patient was seen by gynecology oncology we determined surgical intervention is not an option.  They recommend discontinuing Keytruda for progressive disease and initiating single agent Doxil every 28 days.  Patient will require MUGA scan prior to initiating treatment.  Patient return to clinic in approximately 2 to 3 weeks to initiate cycle 1.  Will reimage after cycle 3. Port: Port revision successful.  Proceed with treatment as above. Anemia: Chronic and unchanged.  Patient's most recent hemoglobin is 10.2. Thrombocytopenia: Chronic  and unchanged.  Patient's most recent platelet count is 128. Hyperglycemia: Chronic and unchanged.  Continue monitoring and treatment per primary care. Claudication symptoms: Resolved.  Patient underwent revascularization procedure on February 04, 2023. Right hamstring tendon rupture: Patient reports there is no plan for surgical repair.  Patient expressed understanding and was in agreement with this plan. She also understands that She can call clinic at any time with any questions, concerns, or complaints.    Cancer Staging  Endometrial cancer [facility]) Staging form: Corpus Uteri - Carcinoma and Carcinosarcoma, AJCC 8th Edition - Clinical stage from 05/30/2022: FIGO Stage IIIB (cT3b, cN0, cM0) - Signed by Jeralyn Ruths, MD on 05/30/2022 Stage prefix: Initial diagnosis   Jeralyn Ruths, MD   03/25/2023 12:30 PM

## 2023-03-25 NOTE — Progress Notes (Signed)
DISCONTINUE ON PATHWAY REGIMEN - Uterine     A cycle is every 21 days:     Doxorubicin   **Always confirm dose/schedule in your pharmacy ordering system**  REASON: Other Reason PRIOR TREATMENT: [id number]: Doxorubicin 50 mg/m2 q21 Days Until Progression, Unacceptable Toxicity, Complete Response, or Lifetime Cumulative Anthracycline Dose Reached TREATMENT RESPONSE: Unable to Evaluate  START OFF PATHWAY REGIMEN - Uterine   [id number]:Liposomal Doxorubicin 50 mg/m2 IV D1 q28 Days:   A cycle is every 28 days:     Liposomal doxorubicin   **Always confirm dose/schedule in your pharmacy ordering system**  Patient Characteristics: Serous Carcinoma, Recurrent/Progressive Disease, Second Line, Relapse < 12 Months From Prior Therapy Histology: Serous Carcinoma Therapeutic Status: Recurrent or Progressive Disease Line of Therapy: Second Line Time to Recurrence: Relapse < 12 Months From Prior Therapy Intent of Therapy: Non-Curative / Palliative Intent, Discussed with Patient

## 2023-03-26 ENCOUNTER — Encounter: Payer: Self-pay | Admitting: Oncology

## 2023-03-26 ENCOUNTER — Other Ambulatory Visit: Payer: Self-pay

## 2023-03-26 ENCOUNTER — Other Ambulatory Visit: Payer: Self-pay | Admitting: *Deleted

## 2023-03-26 MED ORDER — ALBUTEROL SULFATE HFA 108 (90 BASE) MCG/ACT IN AERS: 2.0000 | INHALATION_SPRAY | Freq: Four times a day (QID) | RESPIRATORY_TRACT | 0 refills | Status: DC | PRN

## 2023-03-27 ENCOUNTER — Inpatient Hospital Stay: Payer: PPO

## 2023-03-29 ENCOUNTER — Emergency Department: Payer: PPO

## 2023-03-29 ENCOUNTER — Encounter: Payer: Self-pay | Admitting: Emergency Medicine

## 2023-03-29 ENCOUNTER — Observation Stay
Admission: EM | Admit: 2023-03-29 | Discharge: 2023-03-30 | Disposition: A | Discharge status: Home / Self Care | Payer: PPO | Attending: Internal Medicine | Admitting: Internal Medicine

## 2023-03-29 ENCOUNTER — Other Ambulatory Visit: Payer: Self-pay

## 2023-03-29 ENCOUNTER — Ambulatory Visit
Admission: EM | Admit: 2023-03-29 | Discharge: 2023-03-29 | Disposition: A | Discharge status: Home / Self Care | Payer: PPO | Attending: Emergency Medicine | Admitting: Emergency Medicine

## 2023-03-29 DIAGNOSIS — Z7982 Long term (current) use of aspirin: Secondary | ICD-10-CM | POA: Insufficient documentation

## 2023-03-29 DIAGNOSIS — I1A0 Resistant hypertension: Secondary | ICD-10-CM | POA: Diagnosis not present

## 2023-03-29 DIAGNOSIS — R7981 Abnormal blood-gas level: Secondary | ICD-10-CM

## 2023-03-29 DIAGNOSIS — F411 Generalized anxiety disorder: Secondary | ICD-10-CM | POA: Diagnosis not present

## 2023-03-29 DIAGNOSIS — F32A Depression, unspecified: Secondary | ICD-10-CM | POA: Insufficient documentation

## 2023-03-29 DIAGNOSIS — Z79899 Other long term (current) drug therapy: Secondary | ICD-10-CM | POA: Diagnosis not present

## 2023-03-29 DIAGNOSIS — J441 Chronic obstructive pulmonary disease with (acute) exacerbation: Principal | ICD-10-CM | POA: Insufficient documentation

## 2023-03-29 DIAGNOSIS — C541 Malignant neoplasm of endometrium: Secondary | ICD-10-CM | POA: Diagnosis not present

## 2023-03-29 DIAGNOSIS — Z7985 Long-term (current) use of injectable non-insulin antidiabetic drugs: Secondary | ICD-10-CM | POA: Insufficient documentation

## 2023-03-29 DIAGNOSIS — Z794 Long term (current) use of insulin: Secondary | ICD-10-CM | POA: Insufficient documentation

## 2023-03-29 DIAGNOSIS — Z1152 Encounter for screening for COVID-19: Secondary | ICD-10-CM | POA: Diagnosis not present

## 2023-03-29 DIAGNOSIS — Z152 Genetic susceptibility to obesity: Secondary | ICD-10-CM | POA: Insufficient documentation

## 2023-03-29 DIAGNOSIS — I10 Essential (primary) hypertension: Secondary | ICD-10-CM

## 2023-03-29 DIAGNOSIS — R0602 Shortness of breath: Secondary | ICD-10-CM | POA: Diagnosis not present

## 2023-03-29 DIAGNOSIS — Z7902 Long term (current) use of antithrombotics/antiplatelets: Secondary | ICD-10-CM | POA: Insufficient documentation

## 2023-03-29 DIAGNOSIS — J45909 Unspecified asthma, uncomplicated: Secondary | ICD-10-CM | POA: Diagnosis not present

## 2023-03-29 DIAGNOSIS — Z87891 Personal history of nicotine dependence: Secondary | ICD-10-CM | POA: Insufficient documentation

## 2023-03-29 DIAGNOSIS — E119 Type 2 diabetes mellitus without complications: Secondary | ICD-10-CM | POA: Diagnosis not present

## 2023-03-29 DIAGNOSIS — J9601 Acute respiratory failure with hypoxia: Secondary | ICD-10-CM | POA: Diagnosis not present

## 2023-03-29 DIAGNOSIS — J449 Chronic obstructive pulmonary disease, unspecified: Secondary | ICD-10-CM | POA: Diagnosis not present

## 2023-03-29 DIAGNOSIS — E785 Hyperlipidemia, unspecified: Secondary | ICD-10-CM | POA: Diagnosis not present

## 2023-03-29 DIAGNOSIS — Z8542 Personal history of malignant neoplasm of other parts of uterus: Secondary | ICD-10-CM | POA: Insufficient documentation

## 2023-03-29 DIAGNOSIS — Z8679 Personal history of other diseases of the circulatory system: Secondary | ICD-10-CM | POA: Insufficient documentation

## 2023-03-29 DIAGNOSIS — D500 Iron deficiency anemia secondary to blood loss (chronic): Secondary | ICD-10-CM | POA: Diagnosis not present

## 2023-03-29 DIAGNOSIS — R7989 Other specified abnormal findings of blood chemistry: Secondary | ICD-10-CM | POA: Insufficient documentation

## 2023-03-29 DIAGNOSIS — R0902 Hypoxemia: Secondary | ICD-10-CM | POA: Diagnosis not present

## 2023-03-29 LAB — COMPREHENSIVE METABOLIC PANEL
ALT: 15 U/L (ref 0–44)
AST: 14 U/L — ABNORMAL LOW (ref 15–41)
Albumin: 3.3 g/dL — ABNORMAL LOW (ref 3.5–5.0)
Alkaline Phosphatase: 98 U/L (ref 38–126)
Anion gap: 10 (ref 5–15)
BUN: 13 mg/dL (ref 8–23)
CO2: 30 mmol/L (ref 22–32)
Calcium: 8.4 mg/dL — ABNORMAL LOW (ref 8.9–10.3)
Chloride: 97 mmol/L — ABNORMAL LOW (ref 98–111)
Creatinine, Ser: 0.91 mg/dL (ref 0.44–1.00)
GFR, Estimated: >60 mL/min (ref >60)
Glucose, Bld: 351 mg/dL — ABNORMAL HIGH (ref 70–99)
Potassium: 3.6 mmol/L (ref 3.5–5.1)
Sodium: 137 mmol/L (ref 135–145)
Total Bilirubin: 0.6 mg/dL (ref <1.2)
Total Protein: 6.5 g/dL (ref 6.5–8.1)

## 2023-03-29 LAB — BASIC METABOLIC PANEL
Anion gap: 12 (ref 5–15)
BUN: 20 mg/dL (ref 8–23)
CO2: 27 mmol/L (ref 22–32)
Calcium: 7.9 mg/dL — ABNORMAL LOW (ref 8.9–10.3)
Chloride: 95 mmol/L — ABNORMAL LOW (ref 98–111)
Creatinine, Ser: 0.97 mg/dL (ref 0.44–1.00)
GFR, Estimated: >60 mL/min (ref >60)
Glucose, Bld: 449 mg/dL — ABNORMAL HIGH (ref 70–99)
Potassium: 3.1 mmol/L — ABNORMAL LOW (ref 3.5–5.1)
Sodium: 134 mmol/L — ABNORMAL LOW (ref 135–145)

## 2023-03-29 LAB — CBC
HCT: 37.5 % (ref 36.0–46.0)
Hemoglobin: 12.2 g/dL (ref 12.0–15.0)
MCH: 27.5 pg (ref 26.0–34.0)
MCHC: 32.5 g/dL (ref 30.0–36.0)
MCV: 84.7 fL (ref 80.0–100.0)
Platelets: 199 10*3/uL (ref 150–400)
RBC: 4.43 MIL/uL (ref 3.87–5.11)
RDW: 14.6 % (ref 11.5–15.5)
WBC: 8.6 10*3/uL (ref 4.0–10.5)
nRBC: 0 % (ref 0.0–0.2)

## 2023-03-29 LAB — RESP PANEL BY RT-PCR (RSV, FLU A&B, COVID)  RVPGX2
Influenza A by PCR: NEGATIVE
Influenza B by PCR: NEGATIVE
Resp Syncytial Virus by PCR: NEGATIVE
SARS Coronavirus 2 by RT PCR: NEGATIVE

## 2023-03-29 LAB — CBG MONITORING, ED
Glucose-Capillary: 383 mg/dL — ABNORMAL HIGH (ref 70–99)
Glucose-Capillary: 443 mg/dL — ABNORMAL HIGH (ref 70–99)
Glucose-Capillary: 485 mg/dL — ABNORMAL HIGH (ref 70–99)

## 2023-03-29 LAB — TROPONIN I (HIGH SENSITIVITY): Troponin I (High Sensitivity): 15 ng/L (ref <18)

## 2023-03-29 LAB — BRAIN NATRIURETIC PEPTIDE: B Natriuretic Peptide: 334.8 pg/mL — ABNORMAL HIGH (ref 0.0–100.0)

## 2023-03-29 MED ORDER — ARFORMOTEROL TARTRATE 15 MCG/2ML IN NEBU
15.0000 ug | INHALATION_SOLUTION | Freq: Two times a day (BID) | RESPIRATORY_TRACT | Status: DC
  Administered 2023-03-29: 15 ug via RESPIRATORY_TRACT
  Filled 2023-03-29 (×2): qty 2

## 2023-03-29 MED ORDER — AMLODIPINE BESYLATE 5 MG PO TABS
10.0000 mg | ORAL_TABLET | Freq: Every day | ORAL | Status: DC
  Administered 2023-03-29: 10 mg via ORAL
  Filled 2023-03-29 (×2): qty 2

## 2023-03-29 MED ORDER — LOSARTAN POTASSIUM 50 MG PO TABS
100.0000 mg | ORAL_TABLET | Freq: Every day | ORAL | Status: DC
  Administered 2023-03-29: 100 mg via ORAL
  Filled 2023-03-29 (×2): qty 2

## 2023-03-29 MED ORDER — INSULIN ASPART 100 UNIT/ML IJ SOLN
15.0000 [IU] | Freq: Once | INTRAMUSCULAR | Status: AC
Start: 2023-03-29 — End: 2023-03-29
  Administered 2023-03-29: 15 [IU] via SUBCUTANEOUS
  Filled 2023-03-29: qty 1

## 2023-03-29 MED ORDER — ALBUTEROL SULFATE (2.5 MG/3ML) 0.083% IN NEBU: 3.0000 mL | INHALATION_SOLUTION | RESPIRATORY_TRACT | Status: DC | PRN

## 2023-03-29 MED ORDER — INSULIN ASPART 100 UNIT/ML IJ SOLN
0.0000 [IU] | Freq: Every day | INTRAMUSCULAR | Status: DC
  Filled 2023-03-29: qty 1

## 2023-03-29 MED ORDER — HYDROCHLOROTHIAZIDE 25 MG PO TABS
25.0000 mg | ORAL_TABLET | Freq: Every day | ORAL | Status: DC
  Administered 2023-03-29: 25 mg via ORAL
  Filled 2023-03-29 (×2): qty 1

## 2023-03-29 MED ORDER — CLOPIDOGREL BISULFATE 75 MG PO TABS
75.0000 mg | ORAL_TABLET | Freq: Every day | ORAL | Status: DC
  Administered 2023-03-29: 75 mg via ORAL
  Filled 2023-03-29 (×2): qty 1

## 2023-03-29 MED ORDER — FERROUS SULFATE 325 (65 FE) MG PO TABS
325.0000 mg | ORAL_TABLET | ORAL | Status: DC
  Administered 2023-03-30: 325 mg via ORAL
  Filled 2023-03-29: qty 1

## 2023-03-29 MED ORDER — CITALOPRAM HYDROBROMIDE 20 MG PO TABS
20.0000 mg | ORAL_TABLET | Freq: Every day | ORAL | Status: DC
  Filled 2023-03-29: qty 1

## 2023-03-29 MED ORDER — AZITHROMYCIN 500 MG PO TABS
500.0000 mg | ORAL_TABLET | Freq: Every day | ORAL | Status: DC
  Administered 2023-03-30: 500 mg via ORAL
  Filled 2023-03-29: qty 1

## 2023-03-29 MED ORDER — IPRATROPIUM-ALBUTEROL 0.5-2.5 (3) MG/3ML IN SOLN
3.0000 mL | Freq: Four times a day (QID) | RESPIRATORY_TRACT | Status: DC
  Administered 2023-03-29 – 2023-03-30 (×4): 3 mL via RESPIRATORY_TRACT
  Filled 2023-03-29: qty 3
  Filled 2023-03-29: qty 6
  Filled 2023-03-29 (×2): qty 3

## 2023-03-29 MED ORDER — ENOXAPARIN SODIUM 40 MG/0.4ML IJ SOSY
40.0000 mg | PREFILLED_SYRINGE | INTRAMUSCULAR | Status: DC
  Filled 2023-03-29: qty 0.4

## 2023-03-29 MED ORDER — INSULIN ASPART 100 UNIT/ML IJ SOLN
4.0000 [IU] | Freq: Three times a day (TID) | INTRAMUSCULAR | Status: DC
  Administered 2023-03-29 – 2023-03-30 (×2): 4 [IU] via SUBCUTANEOUS
  Filled 2023-03-29: qty 1

## 2023-03-29 MED ORDER — METHYLPREDNISOLONE SODIUM SUCC 125 MG IJ SOLR
125.0000 mg | Freq: Once | INTRAMUSCULAR | Status: AC
Start: 2023-03-29 — End: 2023-03-29
  Administered 2023-03-29: 125 mg via INTRAVENOUS
  Filled 2023-03-29: qty 2

## 2023-03-29 MED ORDER — BUPROPION HCL ER (XL) 150 MG PO TB24
300.0000 mg | ORAL_TABLET | Freq: Every day | ORAL | Status: DC
  Administered 2023-03-29: 300 mg via ORAL
  Filled 2023-03-29: qty 2

## 2023-03-29 MED ORDER — INSULIN GLARGINE-YFGN 100 UNIT/ML ~~LOC~~ SOLN
15.0000 [IU] | Freq: Two times a day (BID) | SUBCUTANEOUS | Status: DC
  Administered 2023-03-29: 15 [IU] via SUBCUTANEOUS
  Filled 2023-03-29 (×2): qty 0.15

## 2023-03-29 MED ORDER — ATORVASTATIN CALCIUM 20 MG PO TABS
80.0000 mg | ORAL_TABLET | Freq: Every day | ORAL | Status: DC
  Administered 2023-03-29: 80 mg via ORAL
  Filled 2023-03-29 (×2): qty 4

## 2023-03-29 MED ORDER — AZITHROMYCIN 500 MG IV SOLR
500.0000 mg | INTRAVENOUS | Status: AC
  Administered 2023-03-29: 500 mg via INTRAVENOUS
  Filled 2023-03-29: qty 5

## 2023-03-29 MED ORDER — IPRATROPIUM-ALBUTEROL 0.5-2.5 (3) MG/3ML IN SOLN
3.0000 mL | Freq: Once | RESPIRATORY_TRACT | Status: AC
  Administered 2023-03-29: 3 mL via RESPIRATORY_TRACT
  Filled 2023-03-29: qty 3

## 2023-03-29 MED ORDER — LOSARTAN POTASSIUM-HCTZ 100-25 MG PO TABS: 1.0000 | ORAL_TABLET | Freq: Every day | ORAL | Status: DC

## 2023-03-29 MED ORDER — LABETALOL HCL 5 MG/ML IV SOLN
10.0000 mg | INTRAVENOUS | Status: DC | PRN
  Administered 2023-03-30: 10 mg via INTRAVENOUS
  Filled 2023-03-29: qty 4

## 2023-03-29 MED ORDER — MONTELUKAST SODIUM 10 MG PO TABS
10.0000 mg | ORAL_TABLET | Freq: Every day | ORAL | Status: DC
  Administered 2023-03-29: 10 mg via ORAL
  Filled 2023-03-29: qty 1

## 2023-03-29 MED ORDER — PREDNISONE 20 MG PO TABS
40.0000 mg | ORAL_TABLET | Freq: Every day | ORAL | Status: DC
  Administered 2023-03-30: 40 mg via ORAL
  Filled 2023-03-29: qty 2

## 2023-03-29 MED ORDER — FUROSEMIDE 10 MG/ML IJ SOLN
40.0000 mg | Freq: Once | INTRAMUSCULAR | Status: AC
  Administered 2023-03-29: 40 mg via INTRAVENOUS
  Filled 2023-03-29: qty 4

## 2023-03-29 MED ORDER — METOPROLOL SUCCINATE ER 50 MG PO TB24
50.0000 mg | ORAL_TABLET | Freq: Every day | ORAL | Status: DC
  Administered 2023-03-29: 50 mg via ORAL
  Filled 2023-03-29 (×2): qty 1

## 2023-03-29 MED ORDER — IPRATROPIUM-ALBUTEROL 0.5-2.5 (3) MG/3ML IN SOLN
3.0000 mL | Freq: Once | RESPIRATORY_TRACT | Status: AC
  Administered 2023-03-29: 3 mL via RESPIRATORY_TRACT

## 2023-03-29 MED ORDER — CLONIDINE HCL 0.1 MG PO TABS
0.2000 mg | ORAL_TABLET | Freq: Two times a day (BID) | ORAL | Status: DC
  Administered 2023-03-29: 0.2 mg via ORAL
  Filled 2023-03-29 (×2): qty 2

## 2023-03-29 MED ORDER — ASPIRIN 81 MG PO TBEC
81.0000 mg | DELAYED_RELEASE_TABLET | Freq: Every day | ORAL | Status: DC
  Administered 2023-03-29: 81 mg via ORAL
  Filled 2023-03-29 (×2): qty 1

## 2023-03-29 MED ORDER — CYANOCOBALAMIN 500 MCG PO TABS
1000.0000 ug | ORAL_TABLET | Freq: Every day | ORAL | Status: DC
  Filled 2023-03-29: qty 2

## 2023-03-29 MED ORDER — INSULIN ASPART 100 UNIT/ML IJ SOLN
0.0000 [IU] | Freq: Three times a day (TID) | INTRAMUSCULAR | Status: DC
  Administered 2023-03-29: 20 [IU] via SUBCUTANEOUS
  Administered 2023-03-30: 7 [IU] via SUBCUTANEOUS
  Filled 2023-03-29: qty 1

## 2023-03-29 NOTE — ED Provider Notes (Signed)
[name]    CSN: [id number] Arrival date & time: 03/29/23  1120      History   Chief Complaint Chief Complaint  Patient presents with   Cough    HPI Terri [name] is a 72 y.o. female.  Patient presents with 8-day history of cough, congestion, shortness of breath.  Her symptoms became worse last night.  Her cough is occasionally productive.  She used her albuterol inhaler this morning.  No fever, chest pain, or other symptoms.  Her medical history includes COPD, hypertension, diabetes.  The history is provided by the patient and medical records.    Past Medical History:  Diagnosis Date   Adenomatous colon polyp    Aortic stenosis    Arthritis    Asthma    Claudication (HCC)    COPD (chronic obstructive pulmonary disease) (HCC)    Depression    Diabetes mellitus without complication (HCC)    Esophageal dysphagia    GERD (gastroesophageal reflux disease)    Hyperlipemia    Hypertension    Obesity    Severe obesity (BMI 35.0-35.9 with comorbidity) (HCC)     Patient Active Problem List   Diagnosis Date Noted   Dyspnea on exertion 02/02/2023   Vitamin D deficiency 02/02/2023   Vitamin B 12 deficiency 02/02/2023   Need for hepatitis C screening test 02/02/2023   Screening for osteoporosis 02/02/2023   Need for influenza vaccination 02/02/2023   Diabetic eye exam (HCC) 02/02/2023   Aortic atherosclerosis (HCC) 02/02/2023   Bradycardia 01/14/2023   Anemia 09/28/2022   Postmenopausal bleeding 01/30/2022   Endometrial cancer (HCC) 01/25/2022   Mild aortic stenosis 10/12/2020   Essential hypertension 11/05/2017   Closed fracture of left ankle 03/29/2017   Ankle fracture, left 03/26/2017   Acute respiratory failure with hypoxia (HCC) 08/05/2015   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   Hyperlipemia, mixed 04/05/2015   HTN, goal below 140/80 04/05/2015   Obesity (BMI 30-39.9) 04/05/2015   Type 2 diabetes mellitus with complication, with long-term current use of  insulin (HCC) 04/05/2015   Class 2 severe obesity due to excess calories with serious comorbidity in adult (HCC) 08/19/2013   Obesity, Class II, BMI 35-39.9, with comorbidity 08/19/2013   Chronic obstructive pulmonary disease, unspecified (HCC) 07/31/2012   Atherosclerosis of native arteries of extremity with intermittent claudication (HCC) 03/12/2012   Tobacco use 03/12/2012   Hyperlipidemia associated with type 2 diabetes mellitus (HCC) 06/06/2011   Hypertension associated with diabetes (HCC) 06/06/2011   Mood disorder (HCC) 06/06/2011   Type 2 diabetes with complication (HCC) 06/06/2011    Past Surgical History:  Procedure Laterality Date   BLADDER SURGERY     COLONOSCOPY WITH PROPOFOL N/A 08/05/2015   Procedure: COLONOSCOPY WITH PROPOFOL;  Surgeon: Elnita Maxwell, MD;  Location: [facility] ENDOSCOPY;  Service: Endoscopy;  Laterality: N/A;   ESOPHAGOGASTRODUODENOSCOPY N/A 01/09/2022   Procedure: ESOPHAGOGASTRODUODENOSCOPY (EGD);  Surgeon: Regis Bill, MD;  Location: [facility] ENDOSCOPY;  Service: Endoscopy;  Laterality: N/A;   FOOT SURGERY Left    FRACTURE SURGERY     IR CV LINE INJECTION  06/22/2022   IR IMAGING GUIDED PORT INSERTION  06/05/2022   IR PORT REPAIR CENTRAL VENOUS ACCESS DEVICE  07/02/2022   LOWER EXTREMITY ANGIOGRAPHY Left 02/04/2023   Procedure: Lower Extremity Angiography;  Surgeon: Annice Needy, MD;  Location: [facility] INVASIVE CV LAB;  Service: Cardiovascular;  Laterality: Left;   ORIF ANKLE FRACTURE Left 03/26/2017   Procedure: OPEN REDUCTION INTERNAL FIXATION (ORIF) ANKLE FRACTURE;  Surgeon: Christena Flake, MD;  Location: [facility] ORS;  Service: Orthopedics;  Laterality: Left;    OB History   No obstetric history on file.      Home Medications    Prior to Admission medications   Medication Sig Start Date End Date Taking? Authorizing Provider  albuterol (VENTOLIN HFA) 108 (90 Base) MCG/ACT inhaler Inhale 2 puffs into the lungs every 6 (six) hours as needed for  wheezing or shortness of breath. 03/26/23   Jeralyn Ruths, MD  amLODipine (NORVASC) 10 MG tablet Take 10 mg by mouth daily.    [provider]  aspirin EC 81 MG tablet Take 1 tablet (81 mg total) by mouth daily. Swallow whole. 02/04/23 02/04/24  Annice Needy, MD  atorvastatin (LIPITOR) 80 MG tablet Take 80 mg by mouth daily.    [provider]  buPROPion (WELLBUTRIN XL) 300 MG 24 hr tablet Take 300 mg by mouth daily.    [provider]  Cholecalciferol (VITAMIN D3) 50 MCG (2000 UT) capsule Take 2,000 Units by mouth daily.    [provider]  citalopram (CELEXA) 20 MG tablet Take 20 mg by mouth daily.    [provider]  cloNIDine (CATAPRES) 0.2 MG tablet Take 0.2 mg by mouth 2 (two) times daily.    [provider]  clopidogrel (PLAVIX) 75 MG tablet Take 1 tablet (75 mg total) by mouth daily. 02/28/23   Annice Needy, MD  cyanocobalamin (VITAMIN B12) 1000 MCG tablet Take 1 tablet (1,000 mcg total) by mouth daily. 02/02/23   Dana Allan, MD  glipiZIDE (GLUCOTROL XL) 10 MG 24 hr tablet Take 1 tablet (10 mg total) by mouth daily. Pt dropped bottle and is aware that insurance may not pay yet. May need cash pricing or coupon. Thank you. 10/26/22 10/26/23  Alinda Dooms, NP  insulin aspart (NOVOLOG) 100 UNIT/ML injection Inject 13 Units into the skin 3 (three) times daily before meals.    [provider]  insulin detemir (LEVEMIR FLEXPEN) 100 UNIT/ML FlexPen Inject 25 units in the morning. Inject 23 units in the evening. 12/14/20   [provider]  Iron, Ferrous Sulfate, 325 (65 Fe) MG TABS Take 325 mg by mouth every other day. 02/02/23   Dana Allan, MD  losartan-hydrochlorothiazide (HYZAAR) 100-25 MG tablet Take 1 tablet by mouth daily.    [provider]  metoprolol succinate (TOPROL-XL) 50 MG 24 hr tablet Take 1 tablet by mouth daily. 07/10/22   [provider]  montelukast (SINGULAIR) 10 MG tablet Take 10 mg by  mouth at bedtime.    [provider]  [facility] VERIO test strip 1 each 3 (three) times daily. 08/17/22   [provider]  Semaglutide,0.25 or 0.5MG /DOS, 2 MG/3ML SOPN Inject 0.25 mg into the skin once a week. 02/25/23   Dana Allan, MD    Family History Family History  Problem Relation Age of Onset   CVA Mother    Diabetes Mother    CAD Father    Breast cancer Neg Hx     Social History Social History   Tobacco Use   Smoking status: Former    Current packs/day: 1.00    Average packs/day: 1 pack/day for 40.0 years (40.0 ttl pk-yrs)    Types: Cigarettes   Smokeless tobacco: Never   Tobacco comments:    Quite 2015  Vaping Use   Vaping status: Never Used  Substance Use Topics   Alcohol use: No   Drug use:  No     Allergies   Patient has no known allergies.   Review of Systems Review of Systems  Constitutional:  Negative for chills and fever.  HENT:  Positive for congestion. Negative for ear pain and sore throat.   Respiratory:  Positive for cough and shortness of breath.   Cardiovascular:  Negative for chest pain and palpitations.     Physical Exam Triage Vital Signs ED Triage Vitals  Encounter Vitals Group     BP 03/29/23 1136 (!) 171/70     Systolic BP Percentile --      Diastolic BP Percentile --      Pulse Rate 03/29/23 1131 70     Resp 03/29/23 1131 (!) 30     Temp --      Temp src --      SpO2 03/29/23 1131 94 %     Weight --      Height --      Head Circumference --      Peak Flow --      Pain Score 03/29/23 1133 0     Pain Loc --      Pain Education --      Exclude from Growth Chart --    No data found.  Updated Vital Signs BP (!) 179/57 (BP Location: Left Arm)   Pulse 69   Temp 97.8 F (36.6 C) (Oral)   Resp (!) 24   SpO2 92%   Visual Acuity Right Eye Distance:   Left Eye Distance:   Bilateral Distance:    Right Eye Near:   Left Eye Near:    Bilateral Near:     Physical Exam Constitutional:      General: She  is not in acute distress. HENT:     Right Ear: Tympanic membrane normal.     Left Ear: Tympanic membrane normal.     Nose: Nose normal.     Mouth/Throat:     Mouth: Mucous membranes are moist.     Pharynx: Oropharynx is clear.  Cardiovascular:     Rate and Rhythm: Normal rate and regular rhythm.     Heart sounds: Normal heart sounds.  Pulmonary:     Effort: Pulmonary effort is normal. Tachypnea present. No respiratory distress.     Breath sounds: Decreased air movement present. Wheezing and rhonchi present.     Comments: Scattered rhonchi and wheezing throughout.  After DuoNeb, still tachypneic with wheezing and rhonchi. Neurological:     Mental Status: She is alert.      UC Treatments / Results  Labs (all labs ordered are listed, but only abnormal results are displayed) Labs Reviewed - No data to display  EKG   Radiology No results found.  Procedures Procedures (including critical care time)  Medications Ordered in UC Medications  ipratropium-albuterol (DUONEB) 0.5-2.5 (3) MG/3ML nebulizer solution 3 mL (3 mLs Nebulization Given 03/29/23 1157)    Initial Impression / Assessment and Plan / UC Course  I have reviewed the triage vital signs and the nursing notes.  Pertinent labs & imaging results that were available during my care of the patient were reviewed by me and considered in my medical decision making (see chart for details).    Shortness of breath, COPD exacerbation, borderline low oxygen saturation level, elevated blood pressure reading with hypertension.  DuoNeb treatment given here.  O2 sat 92% on room air.  Patient remains tachypneic with wheezes and rhonchi.  Sending her to the emergency department for evaluation.  She is agreeable to this.  Her friend is with her and drove her here.  Her friend will drive her to [facility] ED.  Final Clinical Impressions(s) / UC Diagnoses   Final diagnoses:  Shortness of breath  COPD exacerbation (HCC)  Elevated blood  pressure reading in office with diagnosis of hypertension  Borderline low O2 saturation     Discharge Instructions      Go to the emergency department for evaluation of your shortness of breath, COPD exacerbation, elevated blood pressure reading, borderline low oxygen level.      ED Prescriptions   None    PDMP not reviewed this encounter.   Mickie Bail, NP 03/29/23 [phone]

## 2023-03-29 NOTE — ED Notes (Signed)
Jon Billings, NP called regarding pt blood glucose/ this nurse was instructed to order BMP and obtain via lab verification

## 2023-03-29 NOTE — Discharge Instructions (Addendum)
Go to the emergency department for evaluation of your shortness of breath, COPD exacerbation, elevated blood pressure reading, borderline low oxygen level.

## 2023-03-29 NOTE — ED Triage Notes (Signed)
Pt referred from UC for ShOB x 1 week with gradual worsening. Pt with labored resp and SpO2 of 82% on arrival. Pt has associated productive cough. Denies fever. Pt with hx of COPD.

## 2023-03-29 NOTE — ED Triage Notes (Signed)
Cough and congestion for one week.  Symptoms worsened last night.    Friend states patient sounds worse than usual while talking in treatment room, answering questions

## 2023-03-29 NOTE — H&P (Signed)
History and Physical    Terri [name] NGE:[id number] DOB: 05/14/1950 DOA: 03/29/2023  PCP: Dana Allan, MD  Patient coming from: Home  I have personally briefly reviewed patient's old medical records in [facility]  Chief Complaint: SOB, hypoxia  HPI: Terri [name] is a 72 y.o. female with medical history significant of COPD, diabetes on long-term IV insulin, endometrial cancer recurrence, PAD on DAPT, aortic stenosis, asthma, depression who presents for several day history of worsening dyspnea associated with cough.  No fevers.  Went to urgent care and was found to be hypoxic in the low 80s.  No home oxygen requirement.  Directed to the emergency room.  On presentation to the emergency room patient is hemodynamically stable.  She is visibly short of breath.  She was given DuoNebs x 2 125 mg of IV Solu-Medrol.  EDP unable to wean from oxygen.  Hospitalist contacted for admission.  On my evaluation patient is resting comfortably in bed.  She is answers all questions appropriately.  No conversational dyspnea noted.  ED Course: Patient was given DuoNebs x 2 and 125 mg of Solu-Medrol.  Hospitalist contacted for admission.  Review of Systems: As per HPI otherwise 10 point review of systems negative.    Past Medical History:  Diagnosis Date   Adenomatous colon polyp    Aortic stenosis    Arthritis    Asthma    Claudication (HCC)    COPD (chronic obstructive pulmonary disease) (HCC)    Depression    Diabetes mellitus without complication (HCC)    Esophageal dysphagia    GERD (gastroesophageal reflux disease)    Hyperlipemia    Hypertension    Obesity    Severe obesity (BMI 35.0-35.9 with comorbidity) (HCC)     Past Surgical History:  Procedure Laterality Date   BLADDER SURGERY     COLONOSCOPY WITH PROPOFOL N/A 08/05/2015   Procedure: COLONOSCOPY WITH PROPOFOL;  Surgeon: Elnita Maxwell, MD;  Location[facility] ENDOSCOPY;  Service: Endoscopy;  Laterality: N/A;    ESOPHAGOGASTRODUODENOSCOPY N/A 01/09/2022   Procedure: ESOPHAGOGASTRODUODENOSCOPY (EGD);  Surgeon: Regis Bill, MD;  Location[facility] ENDOSCOPY;  Service: Endoscopy;  Laterality: N/A;   FOOT SURGERY Left    FRACTURE SURGERY     IR CV LINE INJECTION  06/22/2022   IR IMAGING GUIDED PORT INSERTION  06/05/2022   IR PORT REPAIR CENTRAL VENOUS ACCESS DEVICE  07/02/2022   LOWER EXTREMITY ANGIOGRAPHY Left 02/04/2023   Procedure: Lower Extremity Angiography;  Surgeon: Annice Needy, MD;  Location: [facility] INVASIVE CV LAB;  Service: Cardiovascular;  Laterality: Left;   ORIF ANKLE FRACTURE Left 03/26/2017   Procedure: OPEN REDUCTION INTERNAL FIXATION (ORIF) ANKLE FRACTURE;  Surgeon: Christena Flake, MD;  Location: [facility] ORS;  Service: Orthopedics;  Laterality: Left;     reports that she has quit smoking. Her smoking use included cigarettes. She has a 40 pack-year smoking history. She has never used smokeless tobacco. She reports that she does not drink alcohol and does not use drugs.  No Known Allergies  Family History  Problem Relation Age of Onset   CVA Mother    Diabetes Mother    CAD Father    Breast cancer Neg Hx     Prior to Admission medications   Medication Sig Start Date End Date Taking? Authorizing Provider  albuterol (VENTOLIN HFA) 108 (90 Base) MCG/ACT inhaler Inhale 2 puffs into the lungs every 6 (six) hours as needed for wheezing or shortness of breath. 03/26/23   Gerarda Fraction  J, MD  amLODipine (NORVASC) 10 MG tablet Take 10 mg by mouth daily.    [provider]  aspirin EC 81 MG tablet Take 1 tablet (81 mg total) by mouth daily. Swallow whole. 02/04/23 02/04/24  Annice Needy, MD  atorvastatin (LIPITOR) 80 MG tablet Take 80 mg by mouth daily.    [provider]  buPROPion (WELLBUTRIN XL) 300 MG 24 hr tablet Take 300 mg by mouth daily.    [provider]  Cholecalciferol (VITAMIN D3) 50 MCG (2000 UT) capsule Take 2,000 Units by mouth daily.    [provider]  citalopram (CELEXA) 20 MG tablet Take 20 mg by mouth daily.    [provider]  cloNIDine (CATAPRES) 0.2 MG tablet Take 0.2 mg by mouth 2 (two) times daily.    [provider]  clopidogrel (PLAVIX) 75 MG tablet Take 1 tablet (75 mg total) by mouth daily. 02/28/23   Annice Needy, MD  cyanocobalamin (VITAMIN B12) 1000 MCG tablet Take 1 tablet (1,000 mcg total) by mouth daily. 02/02/23   Dana Allan, MD  glipiZIDE (GLUCOTROL XL) 10 MG 24 hr tablet Take 1 tablet (10 mg total) by mouth daily. Pt dropped bottle and is aware that insurance may not pay yet. May need cash pricing or coupon. Thank you. 10/26/22 10/26/23  Alinda Dooms, NP  insulin aspart (NOVOLOG) 100 UNIT/ML injection Inject 13 Units into the skin 3 (three) times daily before meals.    [provider]  insulin detemir (LEVEMIR FLEXPEN) 100 UNIT/ML FlexPen Inject 25 units in the morning. Inject 23 units in the evening. 12/14/20   [provider]  Iron, Ferrous Sulfate, 325 (65 Fe) MG TABS Take 325 mg by mouth every other day. 02/02/23   Dana Allan, MD  losartan-hydrochlorothiazide (HYZAAR) 100-25 MG tablet Take 1 tablet by mouth daily.    [provider]  metoprolol succinate (TOPROL-XL) 50 MG 24 hr tablet Take 1 tablet by mouth daily. 07/10/22   [provider]  montelukast (SINGULAIR) 10 MG tablet Take 10 mg by mouth at bedtime.    [provider]  [facility] VERIO test strip 1 each 3 (three) times daily. 08/17/22   [provider]  Semaglutide,0.25 or 0.5MG /DOS, 2 MG/3ML SOPN Inject 0.25 mg into the skin once a week. 02/25/23   Dana Allan, MD    Physical Exam: Vitals:   03/29/23 1257 03/29/23 1335 03/29/23 1400 03/29/23 1430  BP:   118/76 (!) 181/59  Pulse:  70 67 72  Resp:  20 (!) 21 (!) 26  Temp:      TempSrc:      SpO2: 93%  100% 100%  Weight: 83.5 kg     Height: 5\' 2"  (1.575 m)       Vitals:   03/29/23 1257 03/29/23 1335 03/29/23 1400  03/29/23 1430  BP:   118/76 (!) 181/59  Pulse:  70 67 72  Resp:  20 (!) 21 (!) 26  Temp:      TempSrc:      SpO2: 93%  100% 100%  Weight: 83.5 kg     Height: 5\' 2"  (1.575 m)      General: No apparent distress, patient appears well HEENT: Normocephalic, atraumatic Neck, supple, trachea midline, no tenderness Heart: Regular rate and rhythm, S1/S2 normal, no murmurs Lungs: Scattered crackles.  Scattered and expiratory wheeze.  Normal work of breathing.  2 L Abdomen: Obese, soft, NT/ND, normal bowel sounds Extremities: Normal, atraumatic, no clubbing  or cyanosis, normal muscle tone Skin: No rashes or lesions, normal color Neurologic: Cranial nerves grossly intact, sensation intact, alert and oriented x3 Psychiatric: Normal affect   Labs on Admission: I have personally reviewed following labs and imaging studies  CBC: Recent Labs  Lab 03/29/23 1324  WBC 8.6  HGB 12.2  HCT 37.5  MCV 84.7  PLT 199   Basic Metabolic Panel: Recent Labs  Lab 03/29/23 1324  NA 137  K 3.6  CL 97*  CO2 30  GLUCOSE 351*  BUN 13  CREATININE 0.91  CALCIUM 8.4*   GFR: Estimated Creatinine Clearance: 55.2 mL/min (by C-G formula based on SCr of 0.91 mg/dL). Liver Function Tests: Recent Labs  Lab 03/29/23 1324  AST 14*  ALT 15  ALKPHOS 98  BILITOT 0.6  PROT 6.5  ALBUMIN 3.3*   No results for input(s): "LIPASE", "AMYLASE" in the last 168 hours. No results for input(s): "AMMONIA" in the last 168 hours. Coagulation Profile: No results for input(s): "INR", "PROTIME" in the last 168 hours. Cardiac Enzymes: No results for input(s): "CKTOTAL", "CKMB", "CKMBINDEX", "TROPONINI" in the last 168 hours. BNP (last 3 results) Recent Labs    01/24/23 1019  PROBNP 163.0*   HbA1C: No results for input(s): "HGBA1C" in the last 72 hours. CBG: No results for input(s): "GLUCAP" in the last 168 hours. Lipid Profile: No results for input(s): "CHOL", "HDL", "LDLCALC", "TRIG", "CHOLHDL", "LDLDIRECT"  in the last 72 hours. Thyroid Function Tests: No results for input(s): "TSH", "T4TOTAL", "FREET4", "T3FREE", "THYROIDAB" in the last 72 hours. Anemia Panel: No results for input(s): "VITAMINB12", "FOLATE", "FERRITIN", "TIBC", "IRON", "RETICCTPCT" in the last 72 hours. Urine analysis: No results found for: "COLORURINE", "APPEARANCEUR", "LABSPEC", "PHURINE", "GLUCOSEU", "HGBUR", "BILIRUBINUR", "KETONESUR", "PROTEINUR", "UROBILINOGEN", "NITRITE", "LEUKOCYTESUR"  Radiological Exams on Admission: DG Chest 2 View  Result Date: 03/29/2023 CLINICAL DATA:  COPD. Worsening shortness of breath over the last week. Low O2 sat. History of endometrial cancer EXAM: CHEST - 2 VIEW COMPARISON:  CT 10/08/2022.  X-ray 09/28/2022. FINDINGS: Right IJ chest port in place with tip at the SVC right atrial junction. Stable interstitial prominence. No consolidation, pneumothorax or effusion. Normal cardiopericardial silhouette without edema. Kyphotic x-ray obscures the apices. Degenerative changes of the spine. Old right-sided rib fractures IMPRESSION: Chest port. Chronic interstitial lung changes. No consolidation or effusion Electronically Signed   By: Karen Kays M.D.   On: 03/29/2023 13:32    EKG: Independently reviewed.  Sinus rhythm  Assessment/Plan Principal Problem:   COPD exacerbation (HCC)  Acute exacerbation of COPD Acute hypoxic respiratory failure Patient presents with several day history of worsening shortness of breath.  Found to be hypoxic in urgent care.  Sent to ED. Plan: Place in observation Prednisone 40 mg a day x 5 days starting 11/30 Schedule DuoNebs Schedule Brovana Wean oxygen as tolerated Check COVID/RVP Empiric azithromycin  Elevated BNP Unable to exclude element of fluid overload Plan: Check 2D echocardiogram Lasix 40 mg IV x 1  PAD Resume DAPT  Refractory hypertension Resume home agents  Insulin dependent diabetes mellitus with long-term use of insulin Home dose of  insulin is glargine 25 units twice daily Will initiate Semglee 15 units twice daily NovoLog 4 units 3 times daily with meals Sliding scale, escalate as needed  Depression/anxiety PTA Wellbutrin and Celexa  Hyperlipidemia PTA statin  Endometrial cancer with recurrence Outpatient follow-up with oncology  Iron deficiency anemia PTA ferrous sulfate    DVT prophylaxis: SQ Lovenox Code Status: Full Family Communication: Friend at bedside  11/29 Disposition Plan: Anticipate return to previous home environment Consults called: None Admission status: Observation, MedSurg   Tresa Moore MD [facility]   If 7PM-7AM, please contact night-coverage   03/29/2023, 3:16 PM

## 2023-03-29 NOTE — ED Notes (Signed)
Patient is being discharged from the Urgent Care and sent to the Emergency Department via POV . Per Wendee Beavers, NP, patient is in need of higher level of care due to limited resources and continued hypoxia/tachypnea post breathing treatment. Patient is aware and verbalizes understanding of plan of care.  Vitals:   03/29/23 1136 03/29/23 1211  BP: (!) 171/70 (!) 179/57  Pulse: 69 69  Resp: (!) 26 (!) 24  Temp:  97.8 F (36.6 C)  SpO2: 96% 92%

## 2023-03-29 NOTE — ED Notes (Signed)
Admission MD made aware of increasing BP

## 2023-03-29 NOTE — ED Provider Notes (Signed)
[facility] Provider Note    Event Date/Time   First MD Initiated Contact with Patient 03/29/23 1329     (approximate)   History   Shortness of Breath   HPI  Terri [name] is a 72 y.o. female who presents with complaints of cough, shortness of breath that is worsened over the last several days.  She denies fevers or chills.  She does have a history of COPD and diabetes, no reported history of CHF.  She does report a history of endometrial cancer recurrence for which she goes to the cancer center.  No pleurisy, no hemoptysis, no chest pain, patient found to have room air oxygen saturations of 82%, started on oxygen in triage     Physical Exam   Triage Vital Signs: ED Triage Vitals  Encounter Vitals Group     BP 03/29/23 1253 (!) 182/72     Systolic BP Percentile --      Diastolic BP Percentile --      Pulse Rate 03/29/23 1253 72     Resp 03/29/23 1253 (!) 26     Temp 03/29/23 1253 98.5 F (36.9 C)     Temp Source 03/29/23 1253 Oral     SpO2 03/29/23 1253 (!) 82 %     Weight 03/29/23 1257 83.5 kg (184 lb)     Height 03/29/23 1257 1.575 m (5\' 2" )     Head Circumference --      Peak Flow --      Pain Score 03/29/23 1257 0     Pain Loc --      Pain Education --      Exclude from Growth Chart --     Most recent vital signs: Vitals:   03/29/23 1400 03/29/23 1430  BP: 118/76 (!) 181/59  Pulse: 67 72  Resp: (!) 21 (!) 26  Temp:    SpO2: 100% 100%     General: Awake, no distress.  CV:  Good peripheral perfusion.  Resp:  Mild tachypnea, scattered wheezing Abd:  No distention.  Other:  No lower extremity edema   ED Results / Procedures / Treatments   Labs (all labs ordered are listed, but only abnormal results are displayed) Labs Reviewed  COMPREHENSIVE METABOLIC PANEL - Abnormal; Notable for the following components:      Result Value   Chloride 97 (*)    Glucose, Bld 351 (*)    Calcium 8.4 (*)    Albumin 3.3 (*)    AST 14 (*)     All other components within normal limits  BRAIN NATRIURETIC PEPTIDE - Abnormal; Notable for the following components:   B Natriuretic Peptide 334.8 (*)    All other components within normal limits  RESP PANEL BY RT-PCR (RSV, FLU A&B, COVID)  RVPGX2  CBC  TROPONIN I (HIGH SENSITIVITY)     EKG  ED ECG REPORT I, Jene Every, the attending physician, personally viewed and interpreted this ECG.  Date: 03/29/2023  Rhythm: normal sinus rhythm QRS Axis: normal Intervals: Bifascicular block ST/T Wave abnormalities: normal Narrative Interpretation: no evidence of acute ischemia    RADIOLOGY Chest x-ray viewed interpret by me, no pneumonia    PROCEDURES:  Critical Care performed: yes  CRITICAL CARE Performed by: Jene Every   Total critical care time: 30 minutes  Critical care time was exclusive of separately billable procedures and treating other patients.  Critical care was necessary to treat or prevent imminent or life-threatening deterioration.  Critical care was  time spent personally by me on the following activities: development of treatment plan with patient and/or surrogate as well as nursing, discussions with consultants, evaluation of patient's response to treatment, examination of patient, obtaining history from patient or surrogate, ordering and performing treatments and interventions, ordering and review of laboratory studies, ordering and review of radiographic studies, pulse oximetry and re-evaluation of patient's condition.   Procedures   MEDICATIONS ORDERED IN ED: Medications  ipratropium-albuterol (DUONEB) 0.5-2.5 (3) MG/3ML nebulizer solution 3 mL (3 mLs Nebulization Given 03/29/23 1433)  ipratropium-albuterol (DUONEB) 0.5-2.5 (3) MG/3ML nebulizer solution 3 mL (3 mLs Nebulization Given 03/29/23 1432)  methylPREDNISolone sodium succinate (SOLU-MEDROL) 125 mg/2 mL injection 125 mg (125 mg Intravenous Given 03/29/23 1431)     IMPRESSION / MDM /  ASSESSMENT AND PLAN / ED COURSE  I reviewed the triage vital signs and the nursing notes. Patient's presentation is most consistent with acute presentation with potential threat to life or bodily function.  Patient presents with shortness of breath in the setting of history of COPD with hypoxia.  Differential includes COPD exacerbation, pneumonia, doubt pneumothorax, CHF  X-ray without clear evidence of pneumonia, white blood cell count is reassuring.  Patient does have elevation of BNP suggesting possible fluid overload  Will treat here in the emergency department for COPD given wheezing, given IV steroids, multiple nebulizers with improvement, she is continues to require nasal cannula oxygen, have consulted the hospitalist team for admission        FINAL CLINICAL IMPRESSION(S) / ED DIAGNOSES   Final diagnoses:  Acute respiratory failure with hypoxia (HCC)  COPD exacerbation (HCC)     Rx / DC Orders   ED Discharge Orders     None        Note:  This document was prepared using Dragon voice recognition software and may include unintentional dictation errors.   Jene Every, MD 03/29/23 [phone]

## 2023-03-30 ENCOUNTER — Observation Stay: Admit: 2023-03-30 | Payer: PPO

## 2023-03-30 DIAGNOSIS — J441 Chronic obstructive pulmonary disease with (acute) exacerbation: Secondary | ICD-10-CM | POA: Diagnosis not present

## 2023-03-30 LAB — CBG MONITORING, ED: Glucose-Capillary: 233 mg/dL — ABNORMAL HIGH (ref 70–99)

## 2023-03-30 MED ORDER — PREDNISONE 20 MG PO TABS: 40.0000 mg | ORAL_TABLET | Freq: Every day | ORAL | 0 refills | Status: AC

## 2023-03-30 MED ORDER — ALBUTEROL SULFATE HFA 108 (90 BASE) MCG/ACT IN AERS: 2.0000 | INHALATION_SPRAY | Freq: Four times a day (QID) | RESPIRATORY_TRACT | 1 refills | Status: AC | PRN

## 2023-03-30 MED ORDER — INSULIN GLARGINE-YFGN 100 UNIT/ML ~~LOC~~ SOLN
25.0000 [IU] | Freq: Two times a day (BID) | SUBCUTANEOUS | Status: DC
  Filled 2023-03-30: qty 0.25

## 2023-03-30 NOTE — Discharge Summary (Signed)
Physician Discharge Summary  Terri [name] JOA:[id number] DOB: 04/01/1951 DOA: 03/29/2023  PCP: Dana Allan, MD  Admit date: 03/29/2023 Discharge date: 03/30/2023  Admitted From: Home Disposition: Home  Recommendations for Outpatient Follow-up:  Follow up with PCP in 1-2 weeks Follow-up with pulmonology 4 to 6 weeks Follow-up with oncology next week  Home Health: No Equipment/Devices: None  Discharge Condition: Stable CODE STATUS: Full Diet recommendation: Carb modified  Brief/Interim Summary: 72 y.o. female with medical history significant of COPD, diabetes on long-term IV insulin, endometrial cancer recurrence, PAD on DAPT, aortic stenosis, asthma, depression who presents for several day history of worsening dyspnea associated with cough.  No fevers.  Went to urgent care and was found to be hypoxic in the low 80s.  No home oxygen requirement.  Directed to the emergency room.   On presentation to the emergency room patient is hemodynamically stable.  She is visibly short of breath.  She was given DuoNebs x 2 125 mg of IV Solu-Medrol.  EDP unable to wean from oxygen.  Hospitalist contacted for admission.  On my evaluation patient is resting comfortably in bed.  She is answers all questions appropriately.  No conversational dyspnea noted.  Patient was weaned from supplemental oxygen.  She remained so through the course of the night.  Evaluated on hospital day #2.  Respiratory status back to baseline.  Lengthy conversation regarding pulmonary care.  Patient was previously seen by lumbar pulmonology and she is instructed to present back as post discharge follow-up.  At time of discharge will recommend short course of prednisone 40 mg a day x 5 days and albuterol inhaler as needed.  Patient will likely benefit from some sort of maintenance treatment for what appears to be COPD asthma overlap syndrome.  Initial echocardiogram deferred as patient is euvolemic.  Patient appears to be scheduled  for an outpatient MUGA scan for for chemotherapy screening purposes.    Discharge Diagnoses:  Principal Problem:   COPD exacerbation (HCC)  Acute exacerbation of COPD Acute hypoxic respiratory failure Patient presents with several day history of worsening shortness of breath.  Found to be hypoxic in urgent care.  Sent to ED. patient weaned from supplemental oxygen on hospital day #1.  Monitored carefully overnight.  Did not require supplemental oxygen.  Respiratory status baseline at time of discharge. Plan: Discharge home.  Prednisone 40 mg a day x 5 days.  Albuterol MDI prescribed.  Follow-up outpatient pulmonary.   Discharge Instructions  Discharge Instructions     Diet - low sodium heart healthy   Complete by: As directed    Increase activity slowly   Complete by: As directed       Allergies as of 03/30/2023   No Known Allergies      Medication List     TAKE these medications    albuterol 108 (90 Base) MCG/ACT inhaler Commonly known as: VENTOLIN HFA Inhale 2 puffs into the lungs every 6 (six) hours as needed for wheezing or shortness of breath.   amLODipine 10 MG tablet Commonly known as: NORVASC Take 10 mg by mouth daily.   aspirin EC 81 MG tablet Take 1 tablet (81 mg total) by mouth daily. Swallow whole.   atorvastatin 80 MG tablet Commonly known as: LIPITOR Take 80 mg by mouth daily.   buPROPion 300 MG 24 hr tablet Commonly known as: WELLBUTRIN XL Take 300 mg by mouth daily.   citalopram 20 MG tablet Commonly known as: CELEXA Take 20 mg by mouth daily.  cloNIDine 0.2 MG tablet Commonly known as: CATAPRES Take 0.2 mg by mouth 2 (two) times daily.   clopidogrel 75 MG tablet Commonly known as: Plavix Take 1 tablet (75 mg total) by mouth daily.   cyanocobalamin 1000 MCG tablet Commonly known as: VITAMIN B12 Take 1 tablet (1,000 mcg total) by mouth daily.   glipiZIDE 10 MG 24 hr tablet Commonly known as: GLUCOTROL XL Take 1 tablet (10 mg  total) by mouth daily. Pt dropped bottle and is aware that insurance may not pay yet. May need cash pricing or coupon. Thank you.   insulin aspart 100 UNIT/ML injection Commonly known as: novoLOG Inject 13 Units into the skin 3 (three) times daily before meals.   Iron (Ferrous Sulfate) 325 (65 Fe) MG Tabs Take 325 mg by mouth every other day.   Levemir FlexPen 100 UNIT/ML FlexPen Generic drug: insulin detemir Inject 25 units in the morning. Inject 23 units in the evening.   losartan-hydrochlorothiazide 100-25 MG tablet Commonly known as: HYZAAR Take 1 tablet by mouth daily.   metoprolol succinate 50 MG 24 hr tablet Commonly known as: TOPROL-XL Take 1 tablet by mouth daily.   montelukast 10 MG tablet Commonly known as: SINGULAIR Take 10 mg by mouth at bedtime.   OneTouch Verio test strip Generic drug: glucose blood 1 each 3 (three) times daily.   predniSONE 20 MG tablet Commonly known as: DELTASONE Take 2 tablets (40 mg total) by mouth daily with breakfast for 5 days.   Semaglutide(0.25 or 0.5MG /DOS) 2 MG/3ML Sopn Inject 0.25 mg into the skin once a week.   Vitamin D3 50 MCG (2000 UT) capsule Take 2,000 Units by mouth daily.        Follow-up Information     Dana Allan, MD. Schedule an appointment as soon as possible for a visit in 1 week(s).   Specialty: Family Medicine Contact information: [address] [place] [address] [phone]         [name], MD. Schedule an appointment as soon as possible for a visit in 4 week(s).   Specialty: Pulmonary Disease Contact information: [address] [place] [address] [phone]                No Known Allergies  Consultations: None   Procedures/Studies: DG Chest 2 View  Result Date: 03/29/2023 CLINICAL DATA:  COPD. Worsening shortness of breath over the last week. Low O2 sat. History of endometrial cancer EXAM: CHEST - 2 VIEW COMPARISON:  CT 10/08/2022.  X-ray  09/28/2022. FINDINGS: Right IJ chest port in place with tip at the SVC right atrial junction. Stable interstitial prominence. No consolidation, pneumothorax or effusion. Normal cardiopericardial silhouette without edema. Kyphotic x-ray obscures the apices. Degenerative changes of the spine. Old right-sided rib fractures IMPRESSION: Chest port. Chronic interstitial lung changes. No consolidation or effusion Electronically Signed   By: Karen Kays M.D.   On: 03/29/2023 13:32   VAS [place] ABI WITH/WO TBI  Result Date: 03/08/2023  LOWER EXTREMITY DOPPLER STUDY Patient Name:  Terri [name]  Date of Exam:   03/04/2023 Medical Rec #: [id number]    Accession #:    [id number] Date of Birth: [birth year]    Patient Gender: F Patient Age:   72 years Exam Location:  [facility] Procedure:      VAS [place] ABI WITH/WO TBI Referring Phys: Festus Barren --------------------------------------------------------------------------------  Indications: Claudication, and peripheral artery disease. High Risk Factors: Hypertension, hyperlipidemia. 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   Vascular Interventions: 02/24/2023: Aortogram and Selective Left Lower Extremity                         Angiogram. PTA of the Left SFA with 5 mm diameter by 15                         cm Length Lutonix drug coated angioplasty balloon. Stent                         placement to the Left SFA with 6 mm diameter by 12 cm                         Length Life stent. PTA of the Left EIA and Proximal CFA                         with 6 mm diameter by 12 cm length Lutonix drug coated                         angioplasty. Stent placement to the Right CIA with 9 mm                         diameter by 38 mm length Lifestream Stent. Comparison Study: 01/18/2023 Performing Technologist: Debbe Bales RVS  Examination Guidelines: A complete evaluation includes at minimum, Doppler waveform signals and systolic blood pressure reading at the level of  bilateral brachial, anterior tibial, and posterior tibial arteries, when vessel segments are accessible. Bilateral testing is considered an integral part of a complete examination. Photoelectric Plethysmograph (PPG) waveforms and toe systolic pressure readings are included as required and additional duplex testing as needed. Limited examinations for reoccurring indications may be performed as noted.  ABI Findings: +---------+------------------+-----+--------+--------+ Right    Rt Pressure (mmHg)IndexWaveformComment  +---------+------------------+-----+--------+--------+ Brachial 217                                     +---------+------------------+-----+--------+--------+ ATA      187               0.86 biphasic         +---------+------------------+-----+--------+--------+ PTA      189               0.87 biphasic         +---------+------------------+-----+--------+--------+ Great Toe216               1.00 Normal           +---------+------------------+-----+--------+--------+ +---------+------------------+-----+--------+-------+ Left     Lt Pressure (mmHg)IndexWaveformComment +---------+------------------+-----+--------+-------+ Brachial 217                                    +---------+------------------+-----+--------+-------+ ATA      204               0.94 biphasic        +---------+------------------+-----+--------+-------+ PTA      203               0.94 biphasic        +---------+------------------+-----+--------+-------+ Gena Fray  0.82 Normal          +---------+------------------+-----+--------+-------+ +-------+-----------+-----------+------------+------------+ ABI/TBIToday's ABIToday's TBIPrevious ABIPrevious TBI +-------+-----------+-----------+------------+------------+ Right  .87        1.00       .82         .69          +-------+-----------+-----------+------------+------------+ Left   .94        .82         .72         .63          +-------+-----------+-----------+------------+------------+ Bilateral ABIs appear increased compared to prior study on 01/18/2023. Bilateral TBIs appear increased compared to prior study on 01/18/2023.  Summary: Right: Resting right ankle-brachial index indicates mild right lower extremity arterial disease. The right toe-brachial index is normal. Left: Resting left ankle-brachial index indicates mild left lower extremity arterial disease. The left toe-brachial index is normal. *See table(s) above for measurements and observations.  Electronically signed by Festus Barren MD on 03/08/2023 at 9:00:06 AM.    Final       Subjective: Seen and examined on the day of discharge.  Stable no distress.  Appropriate for discharge home.  Discharge Exam: Vitals:   03/30/23 0832 03/30/23 0852  BP:  (!) 158/146  Pulse:  77  Resp:  18  Temp: 98.4 F (36.9 C) 98.1 F (36.7 C)  SpO2:  95%   Vitals:   03/30/23 0500 03/30/23 0511 03/30/23 0832 03/30/23 0852  BP: (!) 165/64 (!) 114/57  (!) 158/146  Pulse: 66 67  77  Resp:  18  18  Temp:   98.4 F (36.9 C) 98.1 F (36.7 C)  TempSrc:   Oral Oral  SpO2: 93% 100%  95%  Weight:      Height:        General: Pt is alert, awake, not in acute distress Cardiovascular: RRR, S1/S2 +, no rubs, no gallops Respiratory: CTA bilaterally, no wheezing, no rhonchi Abdominal: Soft, NT, ND, bowel sounds + Extremities: no edema, no cyanosis    The results of significant diagnostics from this hospitalization (including imaging, microbiology, ancillary and laboratory) are listed below for reference.     Microbiology: Recent Results (from the past 240 hour(s))  Resp panel by RT-PCR (RSV, Flu A&B, Covid) Anterior Nasal Swab     Status: None   Collection Time: 03/29/23  2:51 PM   Specimen: Anterior Nasal Swab  Result Value Ref Range Status   SARS Coronavirus 2 by RT PCR NEGATIVE NEGATIVE Final    Comment: (NOTE) SARS-CoV-2 target nucleic acids  are NOT DETECTED.  The SARS-CoV-2 RNA is generally detectable in upper respiratory specimens during the acute phase of infection. The lowest concentration of SARS-CoV-2 viral copies this assay can detect is 138 copies/mL. A negative result does not preclude SARS-Cov-2 infection and should not be used as the sole basis for treatment or other patient management decisions. A negative result may occur with  improper specimen collection/handling, submission of specimen other than nasopharyngeal swab, presence of viral mutation(s) within the areas targeted by this assay, and inadequate number of viral copies(<138 copies/mL). A negative result must be combined with clinical observations, patient history, and epidemiological information. The expected result is Negative.  Fact Sheet for Patients:  BloggerCourse.com  Fact Sheet for Healthcare Providers:  SeriousBroker.it  This test is no t yet approved or cleared by the [place] FDA and  has been authorized for detection and/or diagnosis of SARS-CoV-2 by FDA under an Emergency Use Authorization (  EUA). This EUA will remain  in effect (meaning this test can be used) for the duration of the COVID-19 declaration under Section 564(b)(1) of the Act, 21 U.S.C.section 360bbb-3(b)(1), unless the authorization is terminated  or revoked sooner.       Influenza A by PCR NEGATIVE NEGATIVE Final   Influenza B by PCR NEGATIVE NEGATIVE Final    Comment: (NOTE) The Xpert Xpress SARS-CoV-2/FLU/RSV plus assay is intended as an aid in the diagnosis of influenza from Nasopharyngeal swab specimens and should not be used as a sole basis for treatment. Nasal washings and aspirates are unacceptable for Xpert Xpress SARS-CoV-2/FLU/RSV testing.  Fact Sheet for Patients: BloggerCourse.com  Fact Sheet for Healthcare Providers: SeriousBroker.it  This test is  not yet approved or cleared by the [place] FDA and has been authorized for detection and/or diagnosis of SARS-CoV-2 by FDA under an Emergency Use Authorization (EUA). This EUA will remain in effect (meaning this test can be used) for the duration of the COVID-19 declaration under Section 564(b)(1) of the Act, 21 U.S.C. section 360bbb-3(b)(1), unless the authorization is terminated or revoked.     Resp Syncytial Virus by PCR NEGATIVE NEGATIVE Final    Comment: (NOTE) Fact Sheet for Patients: BloggerCourse.com  Fact Sheet for Healthcare Providers: SeriousBroker.it  This test is not yet approved or cleared by the [place] FDA and has been authorized for detection and/or diagnosis of SARS-CoV-2 by FDA under an Emergency Use Authorization (EUA). This EUA will remain in effect (meaning this test can be used) for the duration of the COVID-19 declaration under Section 564(b)(1) of the Act, 21 U.S.C. section 360bbb-3(b)(1), unless the authorization is terminated or revoked.  Performed at [facility], [address] [place], [place] [address]      Labs: BNP (last 3 results) Recent Labs    03/29/23 1324  BNP 334.8*   Basic Metabolic Panel: Recent Labs  Lab 03/29/23 1324 03/29/23 2040  NA 137 134*  K 3.6 3.1*  CL 97* 95*  CO2 30 27  GLUCOSE 351* 449*  BUN 13 20  CREATININE 0.91 0.97  CALCIUM 8.4* 7.9*   Liver Function Tests: Recent Labs  Lab 03/29/23 1324  AST 14*  ALT 15  ALKPHOS 98  BILITOT 0.6  PROT 6.5  ALBUMIN 3.3*   No results for input(s): "LIPASE", "AMYLASE" in the last 168 hours. No results for input(s): "AMMONIA" in the last 168 hours. CBC: Recent Labs  Lab 03/29/23 1324  WBC 8.6  HGB 12.2  HCT 37.5  MCV 84.7  PLT 199   Cardiac Enzymes: No results for input(s): "CKTOTAL", "CKMB", "CKMBINDEX", "TROPONINI" in the last 168 hours. BNP: Invalid input(s): "POCBNP" CBG: Recent  Labs  Lab 03/29/23 1645 03/29/23 2028 03/29/23 2356 03/30/23 0836  GLUCAP 383* 485* 443* 233*   D-Dimer No results for input(s): "DDIMER" in the last 72 hours. Hgb A1c No results for input(s): "HGBA1C" in the last 72 hours. Lipid Profile No results for input(s): "CHOL", "HDL", "LDLCALC", "TRIG", "CHOLHDL", "LDLDIRECT" in the last 72 hours. Thyroid function studies No results for input(s): "TSH", "T4TOTAL", "T3FREE", "THYROIDAB" in the last 72 hours.  Invalid input(s): "FREET3" Anemia work up No results for input(s): "VITAMINB12", "FOLATE", "FERRITIN", "TIBC", "IRON", "RETICCTPCT" in the last 72 hours. Urinalysis No results found for: "COLORURINE", "APPEARANCEUR", "LABSPEC", "PHURINE", "GLUCOSEU", "HGBUR", "BILIRUBINUR", "KETONESUR", "PROTEINUR", "UROBILINOGEN", "NITRITE", "LEUKOCYTESUR" Sepsis Labs Recent Labs  Lab 03/29/23 1324  WBC 8.6   Microbiology Recent Results (from the past 240 hour(s))  Resp  panel by RT-PCR (RSV, Flu A&B, Covid) Anterior Nasal Swab     Status: None   Collection Time: 03/29/23  2:51 PM   Specimen: Anterior Nasal Swab  Result Value Ref Range Status   SARS Coronavirus 2 by RT PCR NEGATIVE NEGATIVE Final    Comment: (NOTE) SARS-CoV-2 target nucleic acids are NOT DETECTED.  The SARS-CoV-2 RNA is generally detectable in upper respiratory specimens during the acute phase of infection. The lowest concentration of SARS-CoV-2 viral copies this assay can detect is 138 copies/mL. A negative result does not preclude SARS-Cov-2 infection and should not be used as the sole basis for treatment or other patient management decisions. A negative result may occur with  improper specimen collection/handling, submission of specimen other than nasopharyngeal swab, presence of viral mutation(s) within the areas targeted by this assay, and inadequate number of viral copies(<138 copies/mL). A negative result must be combined with clinical observations, patient history,  and epidemiological information. The expected result is Negative.  Fact Sheet for Patients:  BloggerCourse.com  Fact Sheet for Healthcare Providers:  SeriousBroker.it  This test is no t yet approved or cleared by the [place] FDA and  has been authorized for detection and/or diagnosis of SARS-CoV-2 by FDA under an Emergency Use Authorization (EUA). This EUA will remain  in effect (meaning this test can be used) for the duration of the COVID-19 declaration under Section 564(b)(1) of the Act, 21 U.S.C.section 360bbb-3(b)(1), unless the authorization is terminated  or revoked sooner.       Influenza A by PCR NEGATIVE NEGATIVE Final   Influenza B by PCR NEGATIVE NEGATIVE Final    Comment: (NOTE) The Xpert Xpress SARS-CoV-2/FLU/RSV plus assay is intended as an aid in the diagnosis of influenza from Nasopharyngeal swab specimens and should not be used as a sole basis for treatment. Nasal washings and aspirates are unacceptable for Xpert Xpress SARS-CoV-2/FLU/RSV testing.  Fact Sheet for Patients: BloggerCourse.com  Fact Sheet for Healthcare Providers: SeriousBroker.it  This test is not yet approved or cleared by the [place] FDA and has been authorized for detection and/or diagnosis of SARS-CoV-2 by FDA under an Emergency Use Authorization (EUA). This EUA will remain in effect (meaning this test can be used) for the duration of the COVID-19 declaration under Section 564(b)(1) of the Act, 21 U.S.C. section 360bbb-3(b)(1), unless the authorization is terminated or revoked.     Resp Syncytial Virus by PCR NEGATIVE NEGATIVE Final    Comment: (NOTE) Fact Sheet for Patients: BloggerCourse.com  Fact Sheet for Healthcare Providers: SeriousBroker.it  This test is not yet approved or cleared by the [place] FDA and has  been authorized for detection and/or diagnosis of SARS-CoV-2 by FDA under an Emergency Use Authorization (EUA). This EUA will remain in effect (meaning this test can be used) for the duration of the COVID-19 declaration under Section 564(b)(1) of the Act, 21 U.S.C. section 360bbb-3(b)(1), unless the authorization is terminated or revoked.  Performed at [facility], [address] [place], [place] [address]      Time coordinating discharge: Over 30 minutes  SIGNED:   Tresa Moore, MD  [facility] 03/30/2023, 11:45 AM Pager   If 7PM-7AM, please contact night-coverage

## 2023-03-30 NOTE — Progress Notes (Signed)
PT Cancellation Note  Patient Details Name: Terri [name] MRN: [id number] DOB: 1951-03-21   Cancelled Treatment:    Reason Eval/Treat Not Completed: PT screened, no needs identified, will sign off. Pt received in bed and declined PT session. Consult received and chart review complete, entered pt room with MD at bedside, confirmed no need for PT Tx session, approved completing PT orders. Cleared for all therapy Tx sessions. Secure chat sent via pt updated status with OT. D/C in house.   Cashtyn Pouliot [employer] SPT, LAT, ATC  Maralee Higuchi [place] 03/30/2023, 8:10 AM

## 2023-03-30 NOTE — Progress Notes (Signed)
OT Cancellation Note  Patient Details Name: Terri [name] MRN: [id number] DOB: 1951-04-08   Cancelled Treatment:    Reason Eval/Treat Not Completed: OT screened, no needs identified, will sign off. Per chart review and PT report, pt at functional baseline and declines OT intervention. OT to sign off at this time.   Jackquline Denmark, MS, OTR/L , CBIS ascom [phone]  03/30/23, 8:39 AM

## 2023-04-03 ENCOUNTER — Inpatient Hospital Stay: Payer: PPO | Attending: Obstetrics and Gynecology

## 2023-04-08 ENCOUNTER — Encounter: Payer: Self-pay | Admitting: Oncology

## 2023-04-08 ENCOUNTER — Encounter: Payer: Self-pay | Admitting: Family Medicine

## 2023-04-08 ENCOUNTER — Encounter: Payer: Self-pay | Admitting: Pharmacist

## 2023-04-08 NOTE — Progress Notes (Signed)
[employer]isk to follow up on patient's renewal. Deny receipt of provider pages.   Novo application re-submitted today 2025: Ozempic 0.25/0.5 mg pen (1), 0.5 pens (2), 1.0 mg pen (1) Tresiba  (previously prescribed through Novo via endocrinology office. Patient has since transitioned DM care to PCP office and would like renewal through PCP office). As previously documented, has been receiving [place] thorough Novo, though has continued to use Levemir until her supply is used up. Aware these are dosed differently. Aware she must contact office prior to changing insulin.  NovoFine pen needle  Pages uploaded to Media Tab.  Will collaborate with [facility] Medication Advocate team to facilitate future medication assistance renewal/correspondence.  No action needed at this time.   Loree Fee, PharmD Clinical Pharmacist [facility] [phone]

## 2023-04-08 NOTE — Telephone Encounter (Signed)
 Care team updated and letter sent for eye exam notes.

## 2023-04-09 ENCOUNTER — Inpatient Hospital Stay: Payer: PPO | Attending: Obstetrics and Gynecology

## 2023-04-09 ENCOUNTER — Encounter
Admission: RE | Admit: 2023-04-09 | Discharge: 2023-04-09 | Disposition: A | Discharge status: Home / Self Care | Payer: PPO | Source: Ambulatory Visit | Attending: Oncology | Admitting: Oncology

## 2023-04-09 DIAGNOSIS — I350 Nonrheumatic aortic (valve) stenosis: Secondary | ICD-10-CM | POA: Diagnosis not present

## 2023-04-09 DIAGNOSIS — Z5111 Encounter for antineoplastic chemotherapy: Secondary | ICD-10-CM | POA: Insufficient documentation

## 2023-04-09 DIAGNOSIS — C541 Malignant neoplasm of endometrium: Secondary | ICD-10-CM | POA: Insufficient documentation

## 2023-04-09 DIAGNOSIS — I10 Essential (primary) hypertension: Secondary | ICD-10-CM | POA: Insufficient documentation

## 2023-04-09 DIAGNOSIS — D649 Anemia, unspecified: Secondary | ICD-10-CM | POA: Insufficient documentation

## 2023-04-09 DIAGNOSIS — E1165 Type 2 diabetes mellitus with hyperglycemia: Secondary | ICD-10-CM | POA: Insufficient documentation

## 2023-04-09 DIAGNOSIS — Z2989 Encounter for other specified prophylactic measures: Secondary | ICD-10-CM | POA: Insufficient documentation

## 2023-04-09 DIAGNOSIS — D696 Thrombocytopenia, unspecified: Secondary | ICD-10-CM | POA: Insufficient documentation

## 2023-04-09 DIAGNOSIS — D72819 Decreased white blood cell count, unspecified: Secondary | ICD-10-CM | POA: Insufficient documentation

## 2023-04-09 MED ORDER — TECHNETIUM TC 99M-LABELED RED BLOOD CELLS IV KIT
20.0000 | PACK | Freq: Once | INTRAVENOUS | Status: AC | PRN
  Administered 2023-04-09: 21.57 via INTRAVENOUS

## 2023-04-10 ENCOUNTER — Other Ambulatory Visit: Payer: PPO

## 2023-04-10 ENCOUNTER — Ambulatory Visit: Payer: PPO | Admitting: Oncology

## 2023-04-10 ENCOUNTER — Inpatient Hospital Stay (HOSPITAL_BASED_OUTPATIENT_CLINIC_OR_DEPARTMENT_OTHER): Payer: PPO | Admitting: Oncology

## 2023-04-10 ENCOUNTER — Inpatient Hospital Stay: Payer: PPO

## 2023-04-10 ENCOUNTER — Encounter: Payer: Self-pay | Admitting: Oncology

## 2023-04-10 ENCOUNTER — Ambulatory Visit: Payer: PPO

## 2023-04-10 VITALS — BP 135/97 | HR 58 | Temp 97.9°F | Resp 18 | Wt 178.9 lb

## 2023-04-10 DIAGNOSIS — D72819 Decreased white blood cell count, unspecified: Secondary | ICD-10-CM | POA: Diagnosis not present

## 2023-04-10 DIAGNOSIS — D696 Thrombocytopenia, unspecified: Secondary | ICD-10-CM | POA: Diagnosis not present

## 2023-04-10 DIAGNOSIS — C541 Malignant neoplasm of endometrium: Secondary | ICD-10-CM

## 2023-04-10 DIAGNOSIS — E1165 Type 2 diabetes mellitus with hyperglycemia: Secondary | ICD-10-CM | POA: Diagnosis not present

## 2023-04-10 DIAGNOSIS — Z5111 Encounter for antineoplastic chemotherapy: Secondary | ICD-10-CM | POA: Diagnosis not present

## 2023-04-10 DIAGNOSIS — D649 Anemia, unspecified: Secondary | ICD-10-CM | POA: Diagnosis not present

## 2023-04-10 LAB — CBC WITH DIFFERENTIAL (CANCER CENTER ONLY)
Abs Immature Granulocytes: 0.02 10*3/uL (ref 0.00–0.07)
Basophils Absolute: 0 10*3/uL (ref 0.0–0.1)
Basophils Relative: 0 %
Eosinophils Absolute: 0.2 10*3/uL (ref 0.0–0.5)
Eosinophils Relative: 3 %
HCT: 33.3 % — ABNORMAL LOW (ref 36.0–46.0)
Hemoglobin: 10.8 g/dL — ABNORMAL LOW (ref 12.0–15.0)
Immature Granulocytes: 0 %
Lymphocytes Relative: 6 %
Lymphs Abs: 0.4 10*3/uL — ABNORMAL LOW (ref 0.7–4.0)
MCH: 27.8 pg (ref 26.0–34.0)
MCHC: 32.4 g/dL (ref 30.0–36.0)
MCV: 85.6 fL (ref 80.0–100.0)
Monocytes Absolute: 0.5 10*3/uL (ref 0.1–1.0)
Monocytes Relative: 9 %
Neutro Abs: 4.7 10*3/uL (ref 1.7–7.7)
Neutrophils Relative %: 82 %
Platelet Count: 165 10*3/uL (ref 150–400)
RBC: 3.89 MIL/uL (ref 3.87–5.11)
RDW: 15.4 % (ref 11.5–15.5)
WBC Count: 5.8 10*3/uL (ref 4.0–10.5)
nRBC: 0 % (ref 0.0–0.2)

## 2023-04-10 LAB — CMP (CANCER CENTER ONLY)
ALT: 17 U/L (ref 0–44)
AST: 19 U/L (ref 15–41)
Albumin: 3 g/dL — ABNORMAL LOW (ref 3.5–5.0)
Alkaline Phosphatase: 66 U/L (ref 38–126)
Anion gap: 7 (ref 5–15)
BUN: 16 mg/dL (ref 8–23)
CO2: 30 mmol/L (ref 22–32)
Calcium: 8.3 mg/dL — ABNORMAL LOW (ref 8.9–10.3)
Chloride: 104 mmol/L (ref 98–111)
Creatinine: 0.98 mg/dL (ref 0.44–1.00)
GFR, Estimated: >60 mL/min (ref >60)
Glucose, Bld: 140 mg/dL — ABNORMAL HIGH (ref 70–99)
Potassium: 3.8 mmol/L (ref 3.5–5.1)
Sodium: 141 mmol/L (ref 135–145)
Total Bilirubin: 0.6 mg/dL (ref <1.2)
Total Protein: 5.2 g/dL — ABNORMAL LOW (ref 6.5–8.1)

## 2023-04-10 MED ORDER — HEPARIN SOD (PORK) LOCK FLUSH 100 UNIT/ML IV SOLN
500.0000 [IU] | Freq: Once | INTRAVENOUS | Status: AC | PRN
  Administered 2023-04-10: 500 [IU]
  Filled 2023-04-10: qty 5

## 2023-04-10 MED ORDER — DEXTROSE 5 % IV SOLN
INTRAVENOUS | Status: DC
  Filled 2023-04-10: qty 250

## 2023-04-10 MED ORDER — ONDANSETRON HCL 8 MG PO TABS: 8.0000 mg | ORAL_TABLET | Freq: Three times a day (TID) | ORAL | 0 refills | Status: AC | PRN

## 2023-04-10 MED ORDER — DEXAMETHASONE SODIUM PHOSPHATE 10 MG/ML IJ SOLN
10.0000 mg | Freq: Once | INTRAMUSCULAR | Status: AC
  Administered 2023-04-10: 10 mg via INTRAVENOUS
  Filled 2023-04-10: qty 1

## 2023-04-10 MED ORDER — DOXORUBICIN HCL LIPOSOMAL CHEMO INJECTION 2 MG/ML
50.0000 mg/m2 | Freq: Once | INTRAVENOUS | Status: AC
Start: 2023-04-10 — End: 2023-04-10
  Administered 2023-04-10: 100 mg via INTRAVENOUS
  Filled 2023-04-10: qty 50

## 2023-04-10 NOTE — Progress Notes (Signed)
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  INTERVAL HISTORY: Patient returns to clinic today for further evaluation and initiation of cycle 1 single agent Doxil.  She had a fall down her steps earlier this week obtaining significant bruising on her face, but did not seek medical attention.  Patient states she tripped over a pill bottle.  She otherwise feels well.  She has no neurologic complaints.  She denies any recent fevers or illnesses.  She has a good appetite and denies weight loss.  She has no chest pain, shortness of breath, cough, or hemoptysis.  She denies any nausea, vomiting, constipation, or diarrhea.  She has no urinary complaints.  Patient offers no further specific complaints today.  REVIEW OF SYSTEMS:   Review of Systems  Constitutional: Negative.  Negative for fever, malaise/fatigue and weight loss.  Respiratory: Negative.  Negative for cough, hemoptysis and shortness of breath.   Cardiovascular: Negative.  Negative for chest pain and leg swelling.  Gastrointestinal: Negative.  Negative for abdominal pain, blood in stool and melena.  Genitourinary: Negative.  Negative for dysuria.  Musculoskeletal: Negative.  Negative for back pain and falls.  Skin: Negative.  Negative for rash.  Neurological: Negative.  Negative for dizziness, focal weakness, weakness and headaches.  Psychiatric/Behavioral: Negative.  The patient is not nervous/anxious.     As per HPI. Otherwise, a complete review of systems is  negative.  PAST MEDICAL HISTORY: Past Medical History:  Diagnosis Date   Adenomatous colon polyp    Aortic stenosis    Arthritis    Asthma    Claudication (HCC)    COPD (chronic obstructive pulmonary disease) (HCC)    Depression    Diabetes mellitus without complication (HCC)    Esophageal dysphagia    GERD (gastroesophageal reflux disease)    Hyperlipemia    Hypertension    Obesity    Severe obesity (BMI 35.0-35.9 with comorbidity) (HCC)     PAST SURGICAL HISTORY: Past Surgical History:  Procedure Laterality Date   BLADDER SURGERY     COLONOSCOPY WITH PROPOFOL N/A 08/05/2015   Procedure: COLONOSCOPY WITH PROPOFOL;  Surgeon: Elnita Maxwell, MD;  Location: [facility] ENDOSCOPY;  Service: Endoscopy;  Laterality: N/A;   ESOPHAGOGASTRODUODENOSCOPY N/A 01/09/2022   Procedure: ESOPHAGOGASTRODUODENOSCOPY (EGD);  Surgeon: Regis Bill, MD;  Location: [facility] ENDOSCOPY;  Service: Endoscopy;  Laterality: N/A;   FOOT SURGERY Left    FRACTURE SURGERY     IR CV LINE INJECTION  06/22/2022   IR IMAGING GUIDED PORT INSERTION  06/05/2022   IR PORT REPAIR CENTRAL VENOUS ACCESS DEVICE  07/02/2022   LOWER EXTREMITY ANGIOGRAPHY Left 02/04/2023   Procedure: Lower Extremity Angiography;  Surgeon: Annice Needy, MD;  Location: [facility] INVASIVE CV LAB;  Service: Cardiovascular;  Laterality: Left;   ORIF ANKLE FRACTURE Left 03/26/2017   Procedure: OPEN REDUCTION INTERNAL FIXATION (ORIF) ANKLE FRACTURE;  Surgeon: Christena Flake, MD;  Location: [facility] ORS;  Service: Orthopedics;  Laterality: Left;    FAMILY HISTORY: Family History  Problem Relation Age of Onset   CVA Mother    Diabetes Mother    CAD Father  Breast cancer Neg Hx     ADVANCED DIRECTIVES (Y/N):  N  HEALTH MAINTENANCE: Social History   Tobacco Use   Smoking status: Former    Current packs/day: 1.00    Average packs/day: 1 pack/day for 40.0 years (40.0 ttl pk-yrs)    Types: Cigarettes   Smokeless tobacco: Never   Tobacco comments:     Quite 2015  Vaping Use   Vaping status: Never Used  Substance Use Topics   Alcohol use: No   Drug use: No     Colonoscopy:  PAP:  Bone density:  Lipid panel:  No Known Allergies  Current Outpatient Medications  Medication Sig Dispense Refill   albuterol (VENTOLIN HFA) 108 (90 Base) MCG/ACT inhaler Inhale 2 puffs into the lungs every 6 (six) hours as needed for wheezing or shortness of breath. 1 each 1   amLODipine (NORVASC) 10 MG tablet Take 10 mg by mouth daily.     aspirin EC 81 MG tablet Take 1 tablet (81 mg total) by mouth daily. Swallow whole. 150 tablet 2   atorvastatin (LIPITOR) 80 MG tablet Take 80 mg by mouth daily.     buPROPion (WELLBUTRIN XL) 300 MG 24 hr tablet Take 300 mg by mouth daily.     Cholecalciferol (VITAMIN D3) 50 MCG (2000 UT) capsule Take 2,000 Units by mouth daily.     citalopram (CELEXA) 20 MG tablet Take 20 mg by mouth daily.     cloNIDine (CATAPRES) 0.2 MG tablet Take 0.2 mg by mouth 2 (two) times daily.     clopidogrel (PLAVIX) 75 MG tablet Take 1 tablet (75 mg total) by mouth daily. 90 tablet 3   cyanocobalamin (VITAMIN B12) 1000 MCG tablet Take 1 tablet (1,000 mcg total) by mouth daily. 90 tablet 3   glipiZIDE (GLUCOTROL XL) 10 MG 24 hr tablet Take 1 tablet (10 mg total) by mouth daily. Pt dropped bottle and is aware that insurance may not pay yet. May need cash pricing or coupon. Thank you. 30 tablet 0   insulin aspart (NOVOLOG) 100 UNIT/ML injection Inject 13 Units into the skin 3 (three) times daily before meals.     insulin detemir (LEVEMIR FLEXPEN) 100 UNIT/ML FlexPen Inject 25 units in the morning. Inject 23 units in the evening.     Iron, Ferrous Sulfate, 325 (65 Fe) MG TABS Take 325 mg by mouth every other day. 90 tablet 3   losartan-hydrochlorothiazide (HYZAAR) 100-25 MG tablet Take 1 tablet by mouth daily.     metoprolol succinate (TOPROL-XL) 50 MG 24 hr tablet Take 1 tablet by mouth daily.     montelukast (SINGULAIR) 10 MG tablet Take 10  mg by mouth at bedtime.     ondansetron (ZOFRAN) 8 MG tablet Take 1 tablet (8 mg total) by mouth every 8 (eight) hours as needed for nausea or vomiting. 30 tablet 0   ONETOUCH VERIO test strip 1 each 3 (three) times daily.     Semaglutide,0.25 or 0.5MG /DOS, 2 MG/3ML SOPN Inject 0.25 mg into the skin once a week.     No current facility-administered medications for this visit.   Facility-Administered Medications Ordered in Other Visits  Medication Dose Route Frequency Provider Last Rate Last Admin   dextrose 5 % solution   Intravenous Continuous Jeralyn Ruths, MD 10 mL/hr at 04/10/23 0931 New Bag at 04/10/23 0931   DOXOrubicin HCL LIPOSOMAL (DOXIL) 100 mg in dextrose 5 % 500 mL chemo infusion  50 mg/m2 (Treatment Plan Recorded) Intravenous Once [place],  Tollie Pizza, MD        OBJECTIVE: Vitals:   04/10/23 0841  BP: (!) 135/97  Pulse: (!) 58  Resp: 18  Temp: 97.9 F (36.6 C)  SpO2: 97%     Body mass index is 32.72 kg/m.    ECOG FS:0 - Asymptomatic  General: Well-developed, well-nourished, no acute distress. Eyes: Pink conjunctiva, anicteric sclera. HEENT: Normocephalic, moist mucous membranes. Lungs: No audible wheezing or coughing. Heart: Regular rate and rhythm. Abdomen: Soft, nontender, no obvious distention. Musculoskeletal: No edema, cyanosis, or clubbing. Neuro: Alert, answering all questions appropriately. Cranial nerves grossly intact. Skin: No rashes or petechiae noted. Psych: Normal affect.  LAB RESULTS:  Lab Results  Component Value Date   NA 141 04/10/2023   K 3.8 04/10/2023   CL 104 04/10/2023   CO2 30 04/10/2023   GLUCOSE 140 (H) 04/10/2023   BUN 16 04/10/2023   CREATININE 0.98 04/10/2023   CALCIUM 8.3 (L) 04/10/2023   PROT 5.2 (L) 04/10/2023   ALBUMIN 3.0 (L) 04/10/2023   AST 19 04/10/2023   ALT 17 04/10/2023   ALKPHOS 66 04/10/2023   BILITOT 0.6 04/10/2023   GFRNONAA >60 04/10/2023   GFRAA >60 03/27/2017    Lab Results  Component Value  Date   WBC 5.8 04/10/2023   NEUTROABS 4.7 04/10/2023   HGB 10.8 (L) 04/10/2023   HCT 33.3 (L) 04/10/2023   MCV 85.6 04/10/2023   PLT 165 04/10/2023     STUDIES: NM Cardiac Muga Rest  Result Date: 04/09/2023 CLINICAL DATA:  Endometrial cancer.  Pre chemotherapy. EXAM: NUCLEAR MEDICINE CARDIAC BLOOD POOL IMAGING (MUGA) TECHNIQUE: Cardiac multi-gated acquisition was performed at rest following intravenous injection of Tc-46m labeled red blood cells. RADIOPHARMACEUTICALS:  21.57 mCi Tc-92m pertechnetate in-vitro labeled red blood cells IV COMPARISON:  None Available. FINDINGS: Normal left ventricular wall motion. No akinetic or dyskinetic segments are identified. The ejection fraction is calculated at 66.3%. Incidental note is made of moderate diffuse uptake in the spleen. This can be seen with hemolytic anemia. Do not see any abnormality in the spleen on the recent PET-CT. IMPRESSION: Normal left ventricular motion with calculated ejection fraction of 66.3%. Electronically Signed   By: Rudie Meyer M.D.   On: 04/09/2023 15:16   DG Chest 2 View  Result Date: 03/29/2023 CLINICAL DATA:  COPD. Worsening shortness of breath over the last week. Low O2 sat. History of endometrial cancer EXAM: CHEST - 2 VIEW COMPARISON:  CT 10/08/2022.  X-ray 09/28/2022. FINDINGS: Right IJ chest port in place with tip at the SVC right atrial junction. Stable interstitial prominence. No consolidation, pneumothorax or effusion. Normal cardiopericardial silhouette without edema. Kyphotic x-ray obscures the apices. Degenerative changes of the spine. Old right-sided rib fractures IMPRESSION: Chest port. Chronic interstitial lung changes. No consolidation or effusion Electronically Signed   By: Karen Kays M.D.   On: 03/29/2023 13:32    ONCOLOGY HISTORY: Patient was initially hesitant to undergo chemotherapy and elected to do XRT only which was completed on April 18, 2022.  CT scan results from October 08, 2022 reviewed  independently with no obvious evidence of progressive disease. She completed 6 cycles of carboplatinum, Taxol, and Keytruda on October 03, 2022.  Patient then initiated maintenance Keytruda on October 23, 2022.  PET scan results from January 15, 2023 reviewed independently with mild nonspecific residual hypermetabolism in the lower uterine segment as well as small bilateral common iliac nodes possibly suggesting nodal metastasis.  Patient was seen by gynecology oncology who determined  surgical intervention is not an option.  They recommend discontinuing Keytruda for progressive disease and initiating single agent Doxil every 28 days.   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  MUGA on April 09, 2023 revealed an EF of 66%.  Repeat in March 2025.  Proceed with cycle 1 of single agent Doxil.  Return to clinic in 1 week for laboratory work and further evaluation and then in 4 weeks for further evaluation and consideration of cycle 2. Will reimage after cycle 3. Port: Port revision successful.  Proceed with treatment as above. Anemia: Hemoglobin mildly improved to 10.8.  Monitor. Thrombocytopenia: Resolved.   Hyperglycemia: Chronic and unchanged.  Continue monitoring and treatment per primary care. Claudication symptoms: Resolved.  Patient underwent revascularization procedure on February 04, 2023. Right hamstring tendon rupture: Patient reports there is no plan for surgical repair.  I spent a total of 30 minutes reviewing chart data, face-to-face evaluation with the patient, counseling and coordination of care as detailed above.  Patient expressed understanding and was in agreement with this plan. She also understands that She can call clinic at any time with any questions, concerns, or complaints.    Cancer Staging  Endometrial cancer [facility]) Staging form:  Corpus Uteri - Carcinoma and Carcinosarcoma, AJCC 8th Edition - Clinical stage from 05/30/2022: FIGO Stage IIIB (cT3b, cN0, cM0) - Signed by Jeralyn Ruths, MD on 05/30/2022 Stage prefix: Initial diagnosis   Jeralyn Ruths, MD   04/10/2023 9:47 AM

## 2023-04-10 NOTE — Patient Instructions (Signed)

## 2023-04-10 NOTE — Progress Notes (Signed)
Pharmacist Chemotherapy Monitoring - Initial Assessment    Anticipated start date: 04/10/23   The following has been reviewed per standard work regarding the patient's treatment regimen: The patient's diagnosis, treatment plan and drug doses, and organ/hematologic function Lab orders and baseline tests specific to treatment regimen  The treatment plan start date, drug sequencing, and pre-medications Prior authorization status  Patient's documented medication list, including drug-drug interaction screen and prescriptions for anti-emetics and supportive care specific to the treatment regimen The drug concentrations, fluid compatibility, administration routes, and timing of the medications to be used The patient's access for treatment and lifetime cumulative dose history, if applicable  The patient's medication allergies and previous infusion related reactions, if applicable   Changes made to treatment plan:  N/A  Follow up needed:  N/A   Sharen Hones, PharmD, BCPS Clinical Pharmacist   04/10/2023  8:21 AM

## 2023-04-11 ENCOUNTER — Telehealth: Payer: Self-pay

## 2023-04-11 ENCOUNTER — Other Ambulatory Visit: Payer: Self-pay | Admitting: Family Medicine

## 2023-04-11 MED ORDER — ONETOUCH VERIO VI STRP: 1.0000 | ORAL_STRIP | Freq: Three times a day (TID) | 10 refills | Status: DC

## 2023-04-11 NOTE — Telephone Encounter (Signed)
Refill request sent to provider.

## 2023-04-11 NOTE — Telephone Encounter (Signed)
Prescription Request  04/11/2023  LOV: Visit date not found  What is the name of the medication or equipment? ONETOUCH VERIO test strip and buPROPion (WELLBUTRIN XL) 300 MG 24 hr tablet (patient would like for [place] to please write the prescription for a 90-day supply)  Iron, Ferrous Sulfate, 325 (65 Fe) MG TABS (patient is not sure if she needs to continue to take these, if so, she will need a refill)  Have you contacted your pharmacy to request a refill? Yes   Which pharmacy would you like this sent to?  [facility] - [place], [place] - [address] [place] [address] Phone: [phone] Fax: [phone]    Patient notified that their request is being sent to the clinical staff for review and that they should receive a response within 2 business days.   Please advise at Mobile [phone] (mobile)  Patient states she would like to have a 90-day supply for these.

## 2023-04-15 ENCOUNTER — Other Ambulatory Visit: Payer: PPO

## 2023-04-16 ENCOUNTER — Other Ambulatory Visit: Payer: Self-pay

## 2023-04-16 ENCOUNTER — Ambulatory Visit: Payer: PPO | Admitting: Student in an Organized Health Care Education/Training Program

## 2023-04-16 ENCOUNTER — Encounter: Payer: Self-pay | Admitting: Student in an Organized Health Care Education/Training Program

## 2023-04-16 VITALS — BP 110/64 | HR 58 | Temp 97.6°F | Ht 62.0 in | Wt 181.0 lb

## 2023-04-16 DIAGNOSIS — C541 Malignant neoplasm of endometrium: Secondary | ICD-10-CM

## 2023-04-16 DIAGNOSIS — J4540 Moderate persistent asthma, uncomplicated: Secondary | ICD-10-CM | POA: Diagnosis not present

## 2023-04-16 MED ORDER — FLUTICASONE-SALMETEROL 250-50 MCG/ACT IN AEPB: 1.0000 | INHALATION_SPRAY | Freq: Two times a day (BID) | RESPIRATORY_TRACT | 11 refills | Status: DC

## 2023-04-16 MED ORDER — FLUTICASONE-SALMETEROL 250-50 MCG/ACT IN AEPB: 1.0000 | INHALATION_SPRAY | Freq: Two times a day (BID) | RESPIRATORY_TRACT | 6 refills | Status: DC

## 2023-04-16 NOTE — Progress Notes (Unsigned)
Synopsis: Referred in *** by Jeralyn Ruths, MD  Assessment & Plan:   1. Moderate persistent asthma without complication (Primary)  Admitted in November for asthma/copd exacerbation. I'm going to treat for asthma for now, get PFT's, and consider adding LAMA therapy should she have persistent obstruction. Financial difficulty with rest of inhalers. Otherwise symoptoms improved after hospitalization.  - fluticasone-salmeterol (WIXELA INHUB) 250-50 MCG/ACT AEPB; Inhale 1 puff into the lungs in the morning and at bedtime.  Dispense: 180 each; Refill: 6   Return in about 3 months (around 07/15/2023).  I spent *** minutes caring for this patient today, including {EM billing:[id number]}  [name], MD [facility] 04/16/2023 9:55 AM    End of visit medications:  Meds ordered this encounter  Medications   DISCONTD: fluticasone-salmeterol (WIXELA INHUB) 250-50 MCG/ACT AEPB    Sig: Inhale 1 puff into the lungs in the morning and at bedtime.    Dispense:  60 each    Refill:  11   fluticasone-salmeterol (WIXELA INHUB) 250-50 MCG/ACT AEPB    Sig: Inhale 1 puff into the lungs in the morning and at bedtime.    Dispense:  180 each    Refill:  6     Current Outpatient Medications:    albuterol (VENTOLIN HFA) 108 (90 Base) MCG/ACT inhaler, Inhale 2 puffs into the lungs every 6 (six) hours as needed for wheezing or shortness of breath., Disp: 1 each, Rfl: 1   amLODipine (NORVASC) 10 MG tablet, Take 10 mg by mouth daily., Disp: , Rfl:    aspirin EC 81 MG tablet, Take 1 tablet (81 mg total) by mouth daily. Swallow whole., Disp: 150 tablet, Rfl: 2   atorvastatin (LIPITOR) 80 MG tablet, Take 80 mg by mouth daily., Disp: , Rfl:    buPROPion (WELLBUTRIN XL) 300 MG 24 hr tablet, TAKE 1 TABLET BY MOUTH DAILY, Disp: 90 tablet, Rfl: 3   Cholecalciferol (VITAMIN D3) 50 MCG (2000 UT) capsule, Take 2,000 Units by mouth daily., Disp: , Rfl:    citalopram (CELEXA) 20 MG tablet, Take  20 mg by mouth daily., Disp: , Rfl:    cloNIDine (CATAPRES) 0.2 MG tablet, Take 0.2 mg by mouth 2 (two) times daily., Disp: , Rfl:    cyanocobalamin (VITAMIN B12) 1000 MCG tablet, Take 1 tablet (1,000 mcg total) by mouth daily., Disp: 90 tablet, Rfl: 3   fluticasone-salmeterol (WIXELA INHUB) 250-50 MCG/ACT AEPB, Inhale 1 puff into the lungs in the morning and at bedtime., Disp: 180 each, Rfl: 6   glipiZIDE (GLUCOTROL XL) 10 MG 24 hr tablet, Take 1 tablet (10 mg total) by mouth daily. Pt dropped bottle and is aware that insurance may not pay yet. May need cash pricing or coupon. Thank you., Disp: 30 tablet, Rfl: 0   insulin aspart (NOVOLOG) 100 UNIT/ML injection, Inject 13 Units into the skin 3 (three) times daily before meals., Disp: , Rfl:    insulin detemir (LEVEMIR FLEXPEN) 100 UNIT/ML FlexPen, Inject 25 units in the morning. Inject 23 units in the evening., Disp: , Rfl:    losartan-hydrochlorothiazide (HYZAAR) 100-25 MG tablet, Take 1 tablet by mouth daily., Disp: , Rfl:    metoprolol succinate (TOPROL-XL) 50 MG 24 hr tablet, Take 1 tablet by mouth daily., Disp: , Rfl:    montelukast (SINGULAIR) 10 MG tablet, Take 10 mg by mouth at bedtime., Disp: , Rfl:    ONETOUCH VERIO test strip, 1 each by Other route 3 (three) times daily., Disp: 100 each, Rfl:  10   clopidogrel (PLAVIX) 75 MG tablet, Take 1 tablet (75 mg total) by mouth daily., Disp: 90 tablet, Rfl: 3   Iron, Ferrous Sulfate, 325 (65 Fe) MG TABS, Take 325 mg by mouth every other day. (Patient not taking: Reported on 04/16/2023), Disp: 90 tablet, Rfl: 3   ondansetron (ZOFRAN) 8 MG tablet, Take 1 tablet (8 mg total) by mouth every 8 (eight) hours as needed for nausea or vomiting. (Patient not taking: Reported on 04/16/2023), Disp: 30 tablet, Rfl: 0   Semaglutide,0.25 or 0.5MG /DOS, 2 MG/3ML SOPN, Inject 0.25 mg into the skin once a week. (Patient not taking: Reported on 04/16/2023), Disp: , Rfl:    Subjective:   PATIENT ID: Terri [name]  GENDER: female DOB: December 19, 1950, MRN: [id number]  Chief Complaint  Patient presents with   Follow-up    No cough, shortness of breath or wheezing.     HPI ***  Ancillary information including prior medications, full medical/surgical/family/social histories, and PFTs (when available) are listed below and have been reviewed.   ROS   Objective:   Vitals:   04/16/23 0934  BP: 110/64  Pulse: (!) 58  Temp: 97.6 F (36.4 C)  SpO2: 97%  Weight: 181 lb (82.1 kg)  Height: 5\' 2"  (1.575 m)   97% on *** LPM *** RA BMI Readings from Last 3 Encounters:  04/16/23 33.11 kg/m  04/10/23 32.72 kg/m  03/29/23 33.65 kg/m   Wt Readings from Last 3 Encounters:  04/16/23 181 lb (82.1 kg)  04/10/23 178 lb 14.4 oz (81.1 kg)  03/29/23 184 lb (83.5 kg)    Physical Exam    Ancillary Information    Past Medical History:  Diagnosis Date   Adenomatous colon polyp    Aortic stenosis    Arthritis    Asthma    Claudication (HCC)    COPD (chronic obstructive pulmonary disease) (HCC)    Depression    Diabetes mellitus without complication (HCC)    Esophageal dysphagia    GERD (gastroesophageal reflux disease)    Hyperlipemia    Hypertension    Obesity    Severe obesity (BMI 35.0-35.9 with comorbidity) (HCC)      Family History  Problem Relation Age of Onset   CVA Mother    Diabetes Mother    CAD Father    Breast cancer Neg Hx      Past Surgical History:  Procedure Laterality Date   BLADDER SURGERY     COLONOSCOPY WITH PROPOFOL N/A 08/05/2015   Procedure: COLONOSCOPY WITH PROPOFOL;  Surgeon: Elnita Maxwell, MD;  Location[facility] ENDOSCOPY;  Service: Endoscopy;  Laterality: N/A;   ESOPHAGOGASTRODUODENOSCOPY N/A 01/09/2022   Procedure: ESOPHAGOGASTRODUODENOSCOPY (EGD);  Surgeon: Regis Bill, MD;  Location: [facility] ENDOSCOPY;  Service: Endoscopy;  Laterality: N/A;   FOOT SURGERY Left    FRACTURE SURGERY     IR CV LINE INJECTION  06/22/2022   IR IMAGING GUIDED PORT  INSERTION  06/05/2022   IR PORT REPAIR CENTRAL VENOUS ACCESS DEVICE  07/02/2022   LOWER EXTREMITY ANGIOGRAPHY Left 02/04/2023   Procedure: Lower Extremity Angiography;  Surgeon: Annice Needy, MD;  Location: [facility] INVASIVE CV LAB;  Service: Cardiovascular;  Laterality: Left;   ORIF ANKLE FRACTURE Left 03/26/2017   Procedure: OPEN REDUCTION INTERNAL FIXATION (ORIF) ANKLE FRACTURE;  Surgeon: Christena Flake, MD;  Location: [facility] ORS;  Service: Orthopedics;  Laterality: Left;    Social History   Socioeconomic History   Marital status: Single    Spouse name: Not  on file   Number of children: Not on file   Years of education: Not on file   Highest education level: Not on file  Occupational History   Not on file  Tobacco Use   Smoking status: Former    Current packs/day: 1.00    Average packs/day: 1 pack/day for 40.0 years (40.0 ttl pk-yrs)    Types: Cigarettes   Smokeless tobacco: Never   Tobacco comments:    Quite 2015  Vaping Use   Vaping status: Never Used  Substance and Sexual Activity   Alcohol use: No   Drug use: No   Sexual activity: Not on file  Other Topics Concern   Not on file  Social History Narrative   Lives alone.  Maximino Greenland, here with her today.  Indoor cats.     Social Drivers of Corporate investment banker Strain: Low Risk  (12/20/2022)   Received [facility]   Overall Financial Resource Strain (CARDIA)    Difficulty of Paying Living Expenses: Not very hard  Food Insecurity: No Food Insecurity (12/20/2022)   Received from [facility]   Hunger Vital Sign    Worried About Running Out of Food in the Last Year: Never true    Ran Out of Food in the Last Year: Never true  Transportation Needs: Unknown (12/20/2022)   Received from [facility] - Transportation    In the past 12 months, has lack of transportation kept you from medical appointments or from getting medications?: No    Lack of  Transportation (Non-Medical): Not on file  Physical Activity: Inactive (09/20/2022)   Exercise Vital Sign    Days of Exercise per Week: 0 days    Minutes of Exercise per Session: 0 min  Stress: Stress Concern Present (09/20/2022)   [employer] of Occupational Health - Occupational Stress Questionnaire    Feeling of Stress : To some extent  Social Connections: Socially Isolated (09/20/2022)   Social Connection and Isolation Panel [NHANES]    Frequency of Communication with Friends and Family: Once a week    Frequency of Social Gatherings with Friends and Family: Never    Attends Religious Services: Never    Database administrator or Organizations: No    Attends Banker Meetings: Never    Marital Status: Never married  Intimate Partner Violence: Not At Risk (05/30/2022)   Humiliation, Afraid, Rape, and Kick questionnaire    Fear of Current or Ex-Partner: No    Emotionally Abused: No    Physically Abused: No    Sexually Abused: No     No Known Allergies   CBC    Component Value Date/Time   WBC 5.8 04/10/2023 0813   WBC 8.6 03/29/2023 1324   RBC 3.89 04/10/2023 0813   HGB 10.8 (L) 04/10/2023 0813   HCT 33.3 (L) 04/10/2023 0813   PLT 165 04/10/2023 0813   MCV 85.6 04/10/2023 0813   MCH 27.8 04/10/2023 0813   MCHC 32.4 04/10/2023 0813   RDW 15.4 04/10/2023 0813   LYMPHSABS 0.4 (L) 04/10/2023 0813   MONOABS 0.5 04/10/2023 0813   EOSABS 0.2 04/10/2023 0813   BASOSABS 0.0 04/10/2023 0813    Pulmonary Functions Testing Results:     No data to display          Outpatient Medications Prior to Visit  Medication Sig Dispense Refill   albuterol (VENTOLIN HFA) 108 (90 Base) MCG/ACT inhaler Inhale  2 puffs into the lungs every 6 (six) hours as needed for wheezing or shortness of breath. 1 each 1   amLODipine (NORVASC) 10 MG tablet Take 10 mg by mouth daily.     aspirin EC 81 MG tablet Take 1 tablet (81 mg total) by mouth daily. Swallow whole. 150 tablet 2    atorvastatin (LIPITOR) 80 MG tablet Take 80 mg by mouth daily.     buPROPion (WELLBUTRIN XL) 300 MG 24 hr tablet TAKE 1 TABLET BY MOUTH DAILY 90 tablet 3   Cholecalciferol (VITAMIN D3) 50 MCG (2000 UT) capsule Take 2,000 Units by mouth daily.     citalopram (CELEXA) 20 MG tablet Take 20 mg by mouth daily.     cloNIDine (CATAPRES) 0.2 MG tablet Take 0.2 mg by mouth 2 (two) times daily.     cyanocobalamin (VITAMIN B12) 1000 MCG tablet Take 1 tablet (1,000 mcg total) by mouth daily. 90 tablet 3   glipiZIDE (GLUCOTROL XL) 10 MG 24 hr tablet Take 1 tablet (10 mg total) by mouth daily. Pt dropped bottle and is aware that insurance may not pay yet. May need cash pricing or coupon. Thank you. 30 tablet 0   insulin aspart (NOVOLOG) 100 UNIT/ML injection Inject 13 Units into the skin 3 (three) times daily before meals.     insulin detemir (LEVEMIR FLEXPEN) 100 UNIT/ML FlexPen Inject 25 units in the morning. Inject 23 units in the evening.     losartan-hydrochlorothiazide (HYZAAR) 100-25 MG tablet Take 1 tablet by mouth daily.     metoprolol succinate (TOPROL-XL) 50 MG 24 hr tablet Take 1 tablet by mouth daily.     montelukast (SINGULAIR) 10 MG tablet Take 10 mg by mouth at bedtime.     ONETOUCH VERIO test strip 1 each by Other route 3 (three) times daily. 100 each 10   clopidogrel (PLAVIX) 75 MG tablet Take 1 tablet (75 mg total) by mouth daily. 90 tablet 3   Iron, Ferrous Sulfate, 325 (65 Fe) MG TABS Take 325 mg by mouth every other day. (Patient not taking: Reported on 04/16/2023) 90 tablet 3   ondansetron (ZOFRAN) 8 MG tablet Take 1 tablet (8 mg total) by mouth every 8 (eight) hours as needed for nausea or vomiting. (Patient not taking: Reported on 04/16/2023) 30 tablet 0   Semaglutide,0.25 or 0.5MG /DOS, 2 MG/3ML SOPN Inject 0.25 mg into the skin once a week. (Patient not taking: Reported on 04/16/2023)     No facility-administered medications prior to visit.

## 2023-04-17 ENCOUNTER — Inpatient Hospital Stay (HOSPITAL_BASED_OUTPATIENT_CLINIC_OR_DEPARTMENT_OTHER): Payer: PPO | Admitting: Oncology

## 2023-04-17 ENCOUNTER — Inpatient Hospital Stay: Payer: PPO

## 2023-04-17 ENCOUNTER — Encounter: Payer: Self-pay | Admitting: Oncology

## 2023-04-17 VITALS — BP 177/66 | HR 59 | Temp 97.7°F | Resp 18 | Wt 180.6 lb

## 2023-04-17 DIAGNOSIS — C541 Malignant neoplasm of endometrium: Secondary | ICD-10-CM

## 2023-04-17 DIAGNOSIS — Z5111 Encounter for antineoplastic chemotherapy: Secondary | ICD-10-CM | POA: Diagnosis not present

## 2023-04-17 LAB — CBC WITH DIFFERENTIAL (CANCER CENTER ONLY)
Abs Immature Granulocytes: 0.01 10*3/uL (ref 0.00–0.07)
Basophils Absolute: 0 10*3/uL (ref 0.0–0.1)
Basophils Relative: 0 %
Eosinophils Absolute: 0.1 10*3/uL (ref 0.0–0.5)
Eosinophils Relative: 3 %
HCT: 33.2 % — ABNORMAL LOW (ref 36.0–46.0)
Hemoglobin: 10.7 g/dL — ABNORMAL LOW (ref 12.0–15.0)
Immature Granulocytes: 0 %
Lymphocytes Relative: 7 %
Lymphs Abs: 0.2 10*3/uL — ABNORMAL LOW (ref 0.7–4.0)
MCH: 27.4 pg (ref 26.0–34.0)
MCHC: 32.2 g/dL (ref 30.0–36.0)
MCV: 85.1 fL (ref 80.0–100.0)
Monocytes Absolute: 0.1 10*3/uL (ref 0.1–1.0)
Monocytes Relative: 2 %
Neutro Abs: 2.7 10*3/uL (ref 1.7–7.7)
Neutrophils Relative %: 88 %
Platelet Count: 130 10*3/uL — ABNORMAL LOW (ref 150–400)
RBC: 3.9 MIL/uL (ref 3.87–5.11)
RDW: 15.1 % (ref 11.5–15.5)
WBC Count: 3.1 10*3/uL — ABNORMAL LOW (ref 4.0–10.5)
nRBC: 0 % (ref 0.0–0.2)

## 2023-04-17 LAB — CMP (CANCER CENTER ONLY)
ALT: 14 U/L (ref 0–44)
AST: 15 U/L (ref 15–41)
Albumin: 3 g/dL — ABNORMAL LOW (ref 3.5–5.0)
Alkaline Phosphatase: 66 U/L (ref 38–126)
Anion gap: 7 (ref 5–15)
BUN: 21 mg/dL (ref 8–23)
CO2: 28 mmol/L (ref 22–32)
Calcium: 8.4 mg/dL — ABNORMAL LOW (ref 8.9–10.3)
Chloride: 101 mmol/L (ref 98–111)
Creatinine: 0.95 mg/dL (ref 0.44–1.00)
GFR, Estimated: >60 mL/min (ref >60)
Glucose, Bld: 362 mg/dL — ABNORMAL HIGH (ref 70–99)
Potassium: 3.9 mmol/L (ref 3.5–5.1)
Sodium: 136 mmol/L (ref 135–145)
Total Bilirubin: 0.5 mg/dL (ref <1.2)
Total Protein: 5.3 g/dL — ABNORMAL LOW (ref 6.5–8.1)

## 2023-04-17 NOTE — Progress Notes (Signed)
Patient here today to follow up after receiving new chemotherapy last week. Patient denies concerns, reports treatment went well.

## 2023-04-17 NOTE — Progress Notes (Signed)
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  INTERVAL HISTORY: Patient returns to clinic today for further evaluation and to assess her toleration of cycle 1 of single agent Doxil.  She tolerated treatment well without significant side effects.  She currently feels well and is asymptomatic.  She has had no recent falls this week.  She has no neurologic complaints.  She denies any recent fevers or illnesses.  She has a good appetite and denies weight loss.  She has no chest pain, shortness of breath, cough, or hemoptysis.  She denies any nausea, vomiting, constipation, or diarrhea.  She has no urinary complaints.  Patient offers no specific complaints today.  REVIEW OF SYSTEMS:   Review of Systems  Constitutional: Negative.  Negative for fever, malaise/fatigue and weight loss.  Respiratory: Negative.  Negative for cough, hemoptysis and shortness of breath.   Cardiovascular: Negative.  Negative for chest pain and leg swelling.  Gastrointestinal: Negative.  Negative for abdominal pain, blood in stool and melena.  Genitourinary: Negative.  Negative for dysuria.  Musculoskeletal: Negative.  Negative for back pain and falls.  Skin: Negative.  Negative for rash.  Neurological: Negative.  Negative for dizziness, focal weakness, weakness and headaches.  Psychiatric/Behavioral: Negative.  The patient is not nervous/anxious.     As per HPI. Otherwise, a complete review of systems is negative.  PAST MEDICAL HISTORY: Past Medical History:   Diagnosis Date   Adenomatous colon polyp    Aortic stenosis    Arthritis    Asthma    Claudication (HCC)    COPD (chronic obstructive pulmonary disease) (HCC)    Depression    Diabetes mellitus without complication (HCC)    Esophageal dysphagia    GERD (gastroesophageal reflux disease)    Hyperlipemia    Hypertension    Obesity    Severe obesity (BMI 35.0-35.9 with comorbidity) (HCC)     PAST SURGICAL HISTORY: Past Surgical History:  Procedure Laterality Date   BLADDER SURGERY     COLONOSCOPY WITH PROPOFOL N/A 08/05/2015   Procedure: COLONOSCOPY WITH PROPOFOL;  Surgeon: Elnita Maxwell, MD;  Location: [name] ENDOSCOPY;  Service: Endoscopy;  Laterality: N/A;   ESOPHAGOGASTRODUODENOSCOPY N/A 01/09/2022   Procedure: ESOPHAGOGASTRODUODENOSCOPY (EGD);  Surgeon: Regis Bill, MD;  Location: [facility] ENDOSCOPY;  Service: Endoscopy;  Laterality: N/A;   FOOT SURGERY Left    FRACTURE SURGERY     IR CV LINE INJECTION  06/22/2022   IR IMAGING GUIDED PORT INSERTION  06/05/2022   IR PORT REPAIR CENTRAL VENOUS ACCESS DEVICE  07/02/2022   LOWER EXTREMITY ANGIOGRAPHY Left 02/04/2023   Procedure: Lower Extremity Angiography;  Surgeon: Annice Needy, MD;  Location: [facility] INVASIVE CV LAB;  Service: Cardiovascular;  Laterality: Left;   ORIF ANKLE FRACTURE Left 03/26/2017   Procedure: OPEN REDUCTION INTERNAL FIXATION (ORIF) ANKLE FRACTURE;  Surgeon: Christena Flake, MD;  Location: [facility] ORS;  Service: Orthopedics;  Laterality: Left;    FAMILY HISTORY: Family History  Problem Relation Age of Onset   CVA Mother    Diabetes Mother    CAD Father    Breast cancer Neg Hx  ADVANCED DIRECTIVES (Y/N):  N  HEALTH MAINTENANCE: Social History   Tobacco Use   Smoking status: Former    Current packs/day: 1.00    Average packs/day: 1 pack/day for 40.0 years (40.0 ttl pk-yrs)    Types: Cigarettes   Smokeless tobacco: Never   Tobacco comments:    Quite 2015  Vaping Use   Vaping status: Never Used   Substance Use Topics   Alcohol use: No   Drug use: No     Colonoscopy:  PAP:  Bone density:  Lipid panel:  No Known Allergies  Current Outpatient Medications  Medication Sig Dispense Refill   albuterol (VENTOLIN HFA) 108 (90 Base) MCG/ACT inhaler Inhale 2 puffs into the lungs every 6 (six) hours as needed for wheezing or shortness of breath. 1 each 1   amLODipine (NORVASC) 10 MG tablet Take 10 mg by mouth daily.     aspirin EC 81 MG tablet Take 1 tablet (81 mg total) by mouth daily. Swallow whole. 150 tablet 2   atorvastatin (LIPITOR) 80 MG tablet Take 80 mg by mouth daily.     buPROPion (WELLBUTRIN XL) 300 MG 24 hr tablet TAKE 1 TABLET BY MOUTH DAILY 90 tablet 3   Cholecalciferol (VITAMIN D3) 50 MCG (2000 UT) capsule Take 2,000 Units by mouth daily.     citalopram (CELEXA) 20 MG tablet Take 20 mg by mouth daily.     cloNIDine (CATAPRES) 0.2 MG tablet Take 0.2 mg by mouth 2 (two) times daily.     clopidogrel (PLAVIX) 75 MG tablet Take 1 tablet (75 mg total) by mouth daily. 90 tablet 3   cyanocobalamin (VITAMIN B12) 1000 MCG tablet Take 1 tablet (1,000 mcg total) by mouth daily. 90 tablet 3   fluticasone-salmeterol (WIXELA INHUB) 250-50 MCG/ACT AEPB Inhale 1 puff into the lungs in the morning and at bedtime. 180 each 6   glipiZIDE (GLUCOTROL XL) 10 MG 24 hr tablet Take 1 tablet (10 mg total) by mouth daily. Pt dropped bottle and is aware that insurance may not pay yet. May need cash pricing or coupon. Thank you. 30 tablet 0   insulin aspart (NOVOLOG) 100 UNIT/ML injection Inject 13 Units into the skin 3 (three) times daily before meals.     insulin detemir (LEVEMIR FLEXPEN) 100 UNIT/ML FlexPen Inject 25 units in the morning. Inject 23 units in the evening.     losartan-hydrochlorothiazide (HYZAAR) 100-25 MG tablet Take 1 tablet by mouth daily.     metoprolol succinate (TOPROL-XL) 50 MG 24 hr tablet Take 1 tablet by mouth daily.     montelukast (SINGULAIR) 10 MG tablet Take 10 mg by  mouth at bedtime.     ONETOUCH VERIO test strip 1 each by Other route 3 (three) times daily. 100 each 10   Iron, Ferrous Sulfate, 325 (65 Fe) MG TABS Take 325 mg by mouth every other day. (Patient not taking: Reported on 04/16/2023) 90 tablet 3   ondansetron (ZOFRAN) 8 MG tablet Take 1 tablet (8 mg total) by mouth every 8 (eight) hours as needed for nausea or vomiting. (Patient not taking: Reported on 04/17/2023) 30 tablet 0   Semaglutide,0.25 or 0.5MG /DOS, 2 MG/3ML SOPN Inject 0.25 mg into the skin once a week. (Patient not taking: Reported on 04/16/2023)     No current facility-administered medications for this visit.    OBJECTIVE: Vitals:   04/17/23 1028  BP: (!) 177/66  Pulse: (!) 59  Resp: 18  Temp: 97.7 F (36.5 C)  SpO2: 100%  Body mass index is 33.03 kg/m.    ECOG FS:0 - Asymptomatic  General: Well-developed, well-nourished, no acute distress. Eyes: Pink conjunctiva, anicteric sclera. HEENT: Normocephalic, moist mucous membranes. Lungs: No audible wheezing or coughing. Heart: Regular rate and rhythm. Abdomen: Soft, nontender, no obvious distention. Musculoskeletal: No edema, cyanosis, or clubbing. Neuro: Alert, answering all questions appropriately. Cranial nerves grossly intact. Skin: No rashes or petechiae noted. Psych: Normal affect.  LAB RESULTS:  Lab Results  Component Value Date   NA 136 04/17/2023   K 3.9 04/17/2023   CL 101 04/17/2023   CO2 28 04/17/2023   GLUCOSE 362 (H) 04/17/2023   BUN 21 04/17/2023   CREATININE 0.95 04/17/2023   CALCIUM 8.4 (L) 04/17/2023   PROT 5.3 (L) 04/17/2023   ALBUMIN 3.0 (L) 04/17/2023   AST 15 04/17/2023   ALT 14 04/17/2023   ALKPHOS 66 04/17/2023   BILITOT 0.5 04/17/2023   GFRNONAA >60 04/17/2023   GFRAA >60 03/27/2017    Lab Results  Component Value Date   WBC 3.1 (L) 04/17/2023   NEUTROABS 2.7 04/17/2023   HGB 10.7 (L) 04/17/2023   HCT 33.2 (L) 04/17/2023   MCV 85.1 04/17/2023   PLT 130 (L) 04/17/2023      STUDIES: NM Cardiac Muga Rest Result Date: 04/09/2023 CLINICAL DATA:  Endometrial cancer.  Pre chemotherapy. EXAM: NUCLEAR MEDICINE CARDIAC BLOOD POOL IMAGING (MUGA) TECHNIQUE: Cardiac multi-gated acquisition was performed at rest following intravenous injection of Tc-25m labeled red blood cells. RADIOPHARMACEUTICALS:  21.57 mCi Tc-29m pertechnetate in-vitro labeled red blood cells IV COMPARISON:  None Available. FINDINGS: Normal left ventricular wall motion. No akinetic or dyskinetic segments are identified. The ejection fraction is calculated at 66.3%. Incidental note is made of moderate diffuse uptake in the spleen. This can be seen with hemolytic anemia. Do not see any abnormality in the spleen on the recent PET-CT. IMPRESSION: Normal left ventricular motion with calculated ejection fraction of 66.3%. Electronically Signed   By: Rudie Meyer M.D.   On: 04/09/2023 15:16   DG Chest 2 View Result Date: 03/29/2023 CLINICAL DATA:  COPD. Worsening shortness of breath over the last week. Low O2 sat. History of endometrial cancer EXAM: CHEST - 2 VIEW COMPARISON:  CT 10/08/2022.  X-ray 09/28/2022. FINDINGS: Right IJ chest port in place with tip at the SVC right atrial junction. Stable interstitial prominence. No consolidation, pneumothorax or effusion. Normal cardiopericardial silhouette without edema. Kyphotic x-ray obscures the apices. Degenerative changes of the spine. Old right-sided rib fractures IMPRESSION: Chest port. Chronic interstitial lung changes. No consolidation or effusion Electronically Signed   By: Karen Kays M.D.   On: 03/29/2023 13:32    ONCOLOGY HISTORY: Patient was initially hesitant to undergo chemotherapy and elected to do XRT only which was completed on April 18, 2022.  CT scan results from October 08, 2022 reviewed independently with no obvious evidence of progressive disease. She completed 6 cycles of carboplatinum, Taxol, and Keytruda on October 03, 2022.  Patient then  initiated maintenance Keytruda on October 23, 2022.  PET scan results from January 15, 2023 reviewed independently with mild nonspecific residual hypermetabolism in the lower uterine segment as well as small bilateral common iliac nodes possibly suggesting nodal metastasis.  Patient was seen by gynecology oncology who determined surgical intervention is not an option.  They recommend discontinuing Keytruda for progressive disease and initiating single agent Doxil every 28 days.   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  PLAN:    Progressive endometrial  cancer with cervical and vaginal involvement: See oncology history as above.  MUGA on April 09, 2023 revealed an EF of 66%.  Repeat in March 2025.  Patient received cycle 1 of single agent Doxil last week and tolerated treatment well.  Return to clinic in 3 weeks as previously scheduled for further evaluation and consideration of cycle 2.  Will reimage after cycle 3 with the plan of doing 6 total cycles.   Port: Port revision successful.  Proceed with treatment as above. Leukopenia: Mild, monitor.  Patient's total white blood cell count is 3.1. Anemia: Chronic and unchanged.  Patient's hemoglobin is 10.7.  Monitor. Thrombocytopenia: Mild.  Patient's platelet count is 130. Hyperglycemia: Patient's blood glucose is moderately elevated today.  Continue monitoring and treatment per primary care. Claudication symptoms: Resolved.  Patient underwent revascularization procedure on February 04, 2023. Right hamstring tendon rupture: Patient reports there is no plan for surgical repair.  Patient expressed understanding and was in agreement with this plan. She also understands that She can call clinic at any time with any questions, concerns, or complaints.    Cancer Staging  Endometrial cancer [facility]) Staging form: Corpus Uteri - Carcinoma and Carcinosarcoma, AJCC 8th Edition - Clinical stage from 05/30/2022: FIGO Stage  IIIB (cT3b, cN0, cM0) - Signed by Jeralyn Ruths, MD on 05/30/2022 Stage prefix: Initial diagnosis   Jeralyn Ruths, MD   04/17/2023 11:01 AM

## 2023-04-18 ENCOUNTER — Other Ambulatory Visit: Payer: Self-pay

## 2023-04-19 ENCOUNTER — Telehealth: Payer: Self-pay

## 2023-04-19 NOTE — Telephone Encounter (Signed)
Patient assistance meds are available for pick up      Medication: NovoLog  FlexPen Sig: Inject 13 Units into the skin 3 (three) times daily before meals.    QTY: 3 boxes      1 bag have been labeled and placed in designated pick up area  We have limited space for medication please pick up at your earliest convenience.  Pt has an appointment on 04/25/2023 and will pick them up then.   Aleatha Borer, CMA

## 2023-04-22 ENCOUNTER — Encounter: Payer: Self-pay | Admitting: Oncology

## 2023-04-25 ENCOUNTER — Ambulatory Visit (INDEPENDENT_AMBULATORY_CARE_PROVIDER_SITE_OTHER): Payer: PPO | Admitting: Family Medicine

## 2023-04-25 VITALS — BP 154/70 | HR 66 | Temp 98.6°F | Ht 62.0 in | Wt 180.2 lb

## 2023-04-25 DIAGNOSIS — I152 Hypertension secondary to endocrine disorders: Secondary | ICD-10-CM

## 2023-04-25 DIAGNOSIS — D649 Anemia, unspecified: Secondary | ICD-10-CM

## 2023-04-25 DIAGNOSIS — E1169 Type 2 diabetes mellitus with other specified complication: Secondary | ICD-10-CM

## 2023-04-25 DIAGNOSIS — I70213 Atherosclerosis of native arteries of extremities with intermittent claudication, bilateral legs: Secondary | ICD-10-CM

## 2023-04-25 DIAGNOSIS — Z9181 History of falling: Secondary | ICD-10-CM

## 2023-04-25 DIAGNOSIS — J432 Centrilobular emphysema: Secondary | ICD-10-CM

## 2023-04-25 DIAGNOSIS — Z794 Long term (current) use of insulin: Secondary | ICD-10-CM

## 2023-04-25 DIAGNOSIS — E118 Type 2 diabetes mellitus with unspecified complications: Secondary | ICD-10-CM

## 2023-04-25 DIAGNOSIS — E785 Hyperlipidemia, unspecified: Secondary | ICD-10-CM

## 2023-04-25 DIAGNOSIS — E1159 Type 2 diabetes mellitus with other circulatory complications: Secondary | ICD-10-CM

## 2023-04-25 MED ORDER — SEMAGLUTIDE(0.25 OR 0.5MG/DOS) 2 MG/3ML ~~LOC~~ SOPN: 0.2500 mg | PEN_INJECTOR | SUBCUTANEOUS | Status: DC

## 2023-04-25 MED ORDER — SEMAGLUTIDE(0.25 OR 0.5MG/DOS) 2 MG/3ML ~~LOC~~ SOPN: 0.2500 mg | PEN_INJECTOR | SUBCUTANEOUS | 3 refills | Status: DC

## 2023-04-25 NOTE — Progress Notes (Signed)
SUBJECTIVE:   Chief Complaint  Patient presents with   Hospitalization Follow-up   HPI Presents for ED follow up   Discussed the use of AI scribe software for clinical note transcription with the patient, who gave verbal consent to proceed.  History of Present Illness The patient, with a history of hypertension, presented for a follow-up visit after an emergency department (ED) visit a month ago due to breathing problems. The patient was prescribed steroids and a generic form of bath beer, which they have been using since the ED visit. The patient reported no current breathing issues and has been following up with a pulmonologist.  The patient's blood pressure was elevated during the visit, which they attributed to a sore in their mouth due to dental issues. The patient has not seen a dentist in several years and plans to seek dental care. The patient denied having any fevers.  The patient reported a history of falls, with the most recent incidents occurring when they lost balance and fell backwards. The patient denied any loss of consciousness or dizziness during these falls. The patient had previously undergone a procedure on their leg due to difficulty walking and was sent to rehab for strength training.  The patient is on multiple medications, including Norvasc, Metoprolol, Hyzaar, Wellbutrin, Celexa, and Clonidine. The patient reported running out of Ozempic and has been off it for a couple of months. The patient also reported stopping iron supplementation a couple of weeks ago due to confusion about refills. The patient's blood sugar levels have been fluctuating, with a recent reading as high as 285.  The patient has a scheduled appointment with a cardiologist and has been advised to reach out to their vascular surgeon regarding the continuation of Plavix, an antiplatelet medication. The patient also reported having diarrhea in the past, which they believe was due to Metformin.     PERTINENT PMH / PSH: As above  OBJECTIVE:  BP (!) 154/70   Pulse 66   Temp 98.6 F (37 C)   Ht 5\' 2"  (1.575 m)   Wt 180 lb 3.2 oz (81.7 kg)   SpO2 95%   BMI 32.96 kg/m    Physical Exam Vitals reviewed.  Constitutional:      General: She is not in acute distress.    Appearance: Normal appearance. She is obese. She is not ill-appearing, toxic-appearing or diaphoretic.  Eyes:     General:        Right eye: No discharge.        Left eye: No discharge.     Conjunctiva/sclera: Conjunctivae normal.  Cardiovascular:     Rate and Rhythm: Normal rate and regular rhythm.     Heart sounds: Normal heart sounds.  Pulmonary:     Effort: Pulmonary effort is normal.     Breath sounds: Normal breath sounds. No wheezing or rhonchi.  Abdominal:     General: Bowel sounds are normal.  Musculoskeletal:        General: Normal range of motion.  Skin:    General: Skin is warm and dry.  Neurological:     General: No focal deficit present.     Mental Status: She is alert and oriented to person, place, and time. Mental status is at baseline.  Psychiatric:        Mood and Affect: Mood normal.        Behavior: Behavior normal.        Thought Content: Thought content normal.  Judgment: Judgment normal.        02/25/2023   11:40 AM 01/24/2023    9:29 AM 05/30/2022    9:29 AM  Depression screen PHQ 2/9  Decreased Interest 0 0 0  Down, Depressed, Hopeless 1 0 0  PHQ - 2 Score 1 0 0  Altered sleeping 1 0   Tired, decreased energy 1 0   Change in appetite 0 0   Feeling bad or failure about yourself  0 0   Trouble concentrating 0 0   Moving slowly or fidgety/restless 0 0   Suicidal thoughts 0 0   PHQ-9 Score 3 0   Difficult doing work/chores Not difficult at all Not difficult at all       01/24/2023    9:29 AM  GAD 7 : Generalized Anxiety Score  Nervous, Anxious, on Edge 0  Control/stop worrying 0  Worry too much - different things 0  Trouble relaxing 0  Restless 0   Easily annoyed or irritable 0  Afraid - awful might happen 0  Total GAD 7 Score 0  Anxiety Difficulty Not difficult at all    ASSESSMENT/PLAN:  Centrilobular emphysema (HCC) Assessment & Plan: Patient was seen in the ED for breathing problems in November and was prescribed a generic form of Advair. Patient reports improvement and is continuing to use the medication. Follow-up with pulmonology has occurred. -Continue current inhaler as prescribed by pulmonologist.   Type 2 diabetes mellitus with complication, with long-term current use of insulin (HCC) -     Semaglutide(0.25 or 0.5MG /DOS); Inject 0.25 mg into the skin once a week.  Dispense: 3 mL; Refill: 3 -     Semaglutide(0.25 or 0.5MG /DOS); Inject 0.25 mg into the skin once a week.  Hypertension associated with diabetes (HCC) Assessment & Plan: Elevated blood pressure noted during visit. Patient reports adherence to current antihypertensive medications (Amlodipine, Metoprolol, Hyzaar, Clonidine). -Check blood pressure again before patient leaves. -Consider increasing antihypertensive medications depending on subsequent blood pressure reading.   Type 2 diabetes with complication [facility]) Assessment & Plan: Patient is currently on multiple medications including insulin and Glipizide. Recent blood sugar readings have been variable with a high of 285. -Restart Ozempic 0.25 mg weekly. Sample provided -Continue current medications until changes can be made.   Anemia, unspecified type Assessment & Plan: Patient has stopped taking iron supplements for a couple of weeks. Previous labs showed improvement in iron levels with supplementation, but recent labs show a decrease. -Check on refill status for iron supplements. -Encourage patient to resume iron supplementation if refill is available.   Hyperlipidemia associated with type 2 diabetes mellitus (HCC) Assessment & Plan: Patient is currently on cholesterol medication. -Continue  current medication as prescribed.   Atherosclerosis of native artery of both lower extremities with intermittent claudication [facility]) Assessment & Plan: Patient has had a procedure on the leg for difficulty walking. Patient is unsure about the need for continued Plavix. -Advise patient to contact vascular surgeon to confirm need for continued Plavix.   Personal history of fall Assessment & Plan: Patient reports multiple falls without loss of consciousness or dizziness. Previously in rehab for mechanical fall and left AMA -Consider referral to physical therapy for balance training.  Orders: -     Ambulatory referral to Physical Therapy       PDMP reviewed  Return in about 2 months (around 06/26/2023) for PCP, DM.  Dana Allan, MD

## 2023-04-25 NOTE — Patient Instructions (Addendum)
It was a pleasure meeting you today. Thank you for allowing me to take part in your health care.  Our goals for today as we discussed include:  Continue Ferrous sulfate.  Call pharmacy for refills.  Follow up with Vascular to discuss continuing Plavix  Follow up with Pulmonology   Restart Ozempic 0.25 mg weekly.  Please do not not run out. Call clinic if do not receive your medication before last dose.  Follow up with dentist as soon as possible  Please bring in all medications at next office visit  This is a list of the screening recommended for you and due dates:  Health Maintenance  Topic Date Due   COVID-19 Vaccine (1) Never done   Eye exam for diabetics  Never done   DTaP/Tdap/Td vaccine (1 - Tdap) Never done   Zoster (Shingles) Vaccine (1 of 2) Never done   DEXA scan (bone density measurement)  Never done   Medicare Annual Wellness Visit  06/15/2023   Pneumonia Vaccine (2 of 2 - PPSV23 or PCV20) 01/29/2024*   Hemoglobin A1C  06/27/2023   Screening for Lung Cancer  10/08/2023   Yearly kidney health urinalysis for diabetes  01/24/2024   Complete foot exam   02/25/2024   Yearly kidney function blood test for diabetes  04/16/2024   Mammogram  09/26/2024   Colon Cancer Screening  08/04/2025   Flu Shot  Completed   Hepatitis C Screening  Completed   HPV Vaccine  Aged Out  *Topic was postponed. The date shown is not the original due date.     Follow up as needed   If you have any questions or concerns, please do not hesitate to call the office at [phone].  I look forward to our next visit and until then take care and stay safe.  Regards,   Dana Allan, MD   [facility]

## 2023-04-28 ENCOUNTER — Encounter: Payer: Self-pay | Admitting: Family Medicine

## 2023-04-28 DIAGNOSIS — Z9181 History of falling: Secondary | ICD-10-CM | POA: Insufficient documentation

## 2023-04-28 NOTE — Assessment & Plan Note (Signed)
Patient was seen in the ED for breathing problems in November and was prescribed a generic form of Advair. Patient reports improvement and is continuing to use the medication. Follow-up with pulmonology has occurred. -Continue current inhaler as prescribed by pulmonologist.

## 2023-04-28 NOTE — Assessment & Plan Note (Signed)
Patient has stopped taking iron supplements for a couple of weeks. Previous labs showed improvement in iron levels with supplementation, but recent labs show a decrease. -Check on refill status for iron supplements. -Encourage patient to resume iron supplementation if refill is available.

## 2023-04-28 NOTE — Assessment & Plan Note (Signed)
Patient is currently on multiple medications including insulin and Glipizide. Recent blood sugar readings have been variable with a high of 285. -Restart Ozempic 0.25 mg weekly. Sample provided -Continue current medications until changes can be made.

## 2023-04-28 NOTE — Assessment & Plan Note (Signed)
Start Ozempic 0.25 mg weekly.

## 2023-04-28 NOTE — Assessment & Plan Note (Signed)
Elevated blood pressure noted during visit. Patient reports adherence to current antihypertensive medications (Amlodipine, Metoprolol, Hyzaar, Clonidine). -Check blood pressure again before patient leaves. -Consider increasing antihypertensive medications depending on subsequent blood pressure reading.

## 2023-04-28 NOTE — Assessment & Plan Note (Signed)
Patient has had a procedure on the leg for difficulty walking. Patient is unsure about the need for continued Plavix. -Advise patient to contact vascular surgeon to confirm need for continued Plavix.

## 2023-04-28 NOTE — Assessment & Plan Note (Signed)
Patient reports multiple falls without loss of consciousness or dizziness. Previously in rehab for mechanical fall and left AMA -Consider referral to physical therapy for balance training.

## 2023-04-28 NOTE — Assessment & Plan Note (Signed)
Patient is currently on cholesterol medication. -Continue current medication as prescribed.

## 2023-04-29 ENCOUNTER — Encounter: Payer: Self-pay | Admitting: Oncology

## 2023-04-30 ENCOUNTER — Other Ambulatory Visit (HOSPITAL_COMMUNITY): Payer: Self-pay

## 2023-04-30 ENCOUNTER — Telehealth: Payer: Self-pay

## 2023-04-30 NOTE — Telephone Encounter (Signed)
 Pharmacy Patient Advocate Encounter   Received notification from CoverMyMeds that prior authorization for Ozempic  (0.25 or 0.5 MG/DOSE) 2MG /3ML pen-injectors is required/requested.   Insurance verification completed.   The patient is insured through [facility] ADVANTAGE/RX ADVANCE .   Per test claim: PA required and submitted KEY/EOC/Request #: [id number] APPROVED from 04/30/23 to 04/29/24. Ran test claim, Copay is $47. This test claim was processed through [facility]- copay amounts may vary at other pharmacies due to pharmacy/plan contracts, or as the patient moves through the different stages of their insurance plan.

## 2023-05-02 NOTE — Telephone Encounter (Signed)
 I have sent paperwork to see if we can get her pt assistant placed in your box.

## 2023-05-07 ENCOUNTER — Encounter: Payer: Self-pay | Admitting: Pharmacist

## 2023-05-07 NOTE — Progress Notes (Signed)
 Manufacturer Assistance Program (MAP) Application   Manufacturer: Novo Nordisk    (Re-enrollment) Medication(s): Tresiba, NovoFine Needles, Novolog , Ozempic   Patient Portion of Application:  12/9: Completed with patient via online enrollment tool. Submitted.  Income Documentation: N/A - Electronic verification elected.  Provider Portion of Application:  12/9: Completed with provider consent via online re-enrollment portal. Including Ozempic . Prescription(s): Included in MAP application.  Application Status: Currently approved for all of the above except Ozempic .  Additionally, only approved through April 2025 despite medicare patient (should be through end of calendar year).  Renewal application submitted early December which was in the re-enrollment window.  December: Med change form completed for Ozempic  a few weeks ago given no Ozempic  on her Novo List.  1/7: Still no Ozempic  per automated system.  05/07/23: Re-faxed copy of patient and provider online forms submitted previously. Also attached copy of patient's insurance card.

## 2023-05-08 ENCOUNTER — Encounter: Payer: Self-pay | Admitting: Oncology

## 2023-05-08 ENCOUNTER — Inpatient Hospital Stay (HOSPITAL_BASED_OUTPATIENT_CLINIC_OR_DEPARTMENT_OTHER): Payer: PPO | Admitting: Oncology

## 2023-05-08 ENCOUNTER — Inpatient Hospital Stay: Payer: PPO | Attending: Obstetrics and Gynecology

## 2023-05-08 ENCOUNTER — Ambulatory Visit: Payer: Self-pay | Admitting: Family Medicine

## 2023-05-08 ENCOUNTER — Inpatient Hospital Stay: Payer: PPO

## 2023-05-08 VITALS — BP 147/46 | HR 66 | Temp 97.9°F | Wt 179.0 lb

## 2023-05-08 VITALS — BP 157/69 | HR 66

## 2023-05-08 DIAGNOSIS — D649 Anemia, unspecified: Secondary | ICD-10-CM | POA: Diagnosis not present

## 2023-05-08 DIAGNOSIS — C541 Malignant neoplasm of endometrium: Secondary | ICD-10-CM | POA: Insufficient documentation

## 2023-05-08 DIAGNOSIS — Z5111 Encounter for antineoplastic chemotherapy: Secondary | ICD-10-CM | POA: Diagnosis not present

## 2023-05-08 DIAGNOSIS — E1165 Type 2 diabetes mellitus with hyperglycemia: Secondary | ICD-10-CM | POA: Insufficient documentation

## 2023-05-08 LAB — CBC WITH DIFFERENTIAL (CANCER CENTER ONLY)
Abs Immature Granulocytes: 0.13 10*3/uL — ABNORMAL HIGH (ref 0.00–0.07)
Basophils Absolute: 0.1 10*3/uL (ref 0.0–0.1)
Basophils Relative: 1 %
Eosinophils Absolute: 0 10*3/uL (ref 0.0–0.5)
Eosinophils Relative: 0 %
HCT: 27.3 % — ABNORMAL LOW (ref 36.0–46.0)
Hemoglobin: 8.6 g/dL — ABNORMAL LOW (ref 12.0–15.0)
Immature Granulocytes: 2 %
Lymphocytes Relative: 3 %
Lymphs Abs: 0.2 10*3/uL — ABNORMAL LOW (ref 0.7–4.0)
MCH: 26.6 pg (ref 26.0–34.0)
MCHC: 31.5 g/dL (ref 30.0–36.0)
MCV: 84.5 fL (ref 80.0–100.0)
Monocytes Absolute: 0.9 10*3/uL (ref 0.1–1.0)
Monocytes Relative: 11 %
Neutro Abs: 7.2 10*3/uL (ref 1.7–7.7)
Neutrophils Relative %: 83 %
Platelet Count: 256 10*3/uL (ref 150–400)
RBC: 3.23 MIL/uL — ABNORMAL LOW (ref 3.87–5.11)
RDW: 17.2 % — ABNORMAL HIGH (ref 11.5–15.5)
WBC Count: 8.6 10*3/uL (ref 4.0–10.5)
nRBC: 0 % (ref 0.0–0.2)

## 2023-05-08 LAB — CMP (CANCER CENTER ONLY)
ALT: 13 U/L (ref 0–44)
AST: 15 U/L (ref 15–41)
Albumin: 2.8 g/dL — ABNORMAL LOW (ref 3.5–5.0)
Alkaline Phosphatase: 66 U/L (ref 38–126)
Anion gap: 11 (ref 5–15)
BUN: 15 mg/dL (ref 8–23)
CO2: 27 mmol/L (ref 22–32)
Calcium: 8.5 mg/dL — ABNORMAL LOW (ref 8.9–10.3)
Chloride: 100 mmol/L (ref 98–111)
Creatinine: 1.04 mg/dL — ABNORMAL HIGH (ref 0.44–1.00)
GFR, Estimated: 57 mL/min — ABNORMAL LOW (ref >60)
Glucose, Bld: 247 mg/dL — ABNORMAL HIGH (ref 70–99)
Potassium: 4 mmol/L (ref 3.5–5.1)
Sodium: 138 mmol/L (ref 135–145)
Total Bilirubin: 0.6 mg/dL (ref 0.0–1.2)
Total Protein: 5.8 g/dL — ABNORMAL LOW (ref 6.5–8.1)

## 2023-05-08 MED ORDER — HEPARIN SOD (PORK) LOCK FLUSH 100 UNIT/ML IV SOLN
500.0000 [IU] | Freq: Once | INTRAVENOUS | Status: AC | PRN
Start: 2023-05-08 — End: 2023-05-08
  Administered 2023-05-08: 500 [IU]
  Filled 2023-05-08: qty 5

## 2023-05-08 MED ORDER — DOXORUBICIN HCL LIPOSOMAL CHEMO INJECTION 2 MG/ML
50.0000 mg/m2 | Freq: Once | INTRAVENOUS | Status: AC
  Administered 2023-05-08: 100 mg via INTRAVENOUS
  Filled 2023-05-08: qty 50

## 2023-05-08 MED ORDER — DEXTROSE 5 % IV SOLN
INTRAVENOUS | Status: DC
  Filled 2023-05-08 (×2): qty 250

## 2023-05-08 MED ORDER — DEXAMETHASONE SODIUM PHOSPHATE 10 MG/ML IJ SOLN
10.0000 mg | Freq: Once | INTRAMUSCULAR | Status: AC
  Administered 2023-05-08: 10 mg via INTRAVENOUS
  Filled 2023-05-08: qty 1

## 2023-05-08 NOTE — Telephone Encounter (Signed)
 Reason for Disposition . Small cut (scratch) or abrasion (scrape) is also present . [1] Falling (two or more falls) AND [2] in past year  Protocols used: Falls and [facility]

## 2023-05-08 NOTE — Telephone Encounter (Addendum)
 Copied from CRM [id number]. Topic: Clinical - Red Word Triage >> May 08, 2023  2:30 PM Terri Terri [name] wrote: [facility] that prompted transfer to Nurse Triage: Patient states she has an issue falling, had Terri [name] fall today walking up 3-4 steps.  Chief Complaint:  Patient calling requesting Terri [name] referral  for Therapy  either physical therapy or occupational to help with her balance and decrease falls. Patient is currently receiving Terri [name] therapy and infusions. She came home and fell outside her home. Patient states she was able to get her self up in Terri [name] few minutes. She would like Terri [name] referral to come out and access  and help her with her balance. Patient called her  PCP because she just saw her  Terri [name] doctor today and would rather discuss with Dr. Hope. Symptoms:  Fall x 1 today-  Mild weakness, Denies any injury just Terri [name] scratch/bruise under her skin from brushing up against her porch. Frequency: x1 today per patient Pertinent Negatives: Patient denies pain. Disposition: [] ED /[] Urgent Care (no appt availability in office) / [] Appointment(In office/virtual)/ [x]  Terri Terri [name] Virtual Care/ [] Home Care/ [] Refused Recommended Disposition /[] [place]/ []  Follow-up with PCP Additional Notes:  Patient requesting  Virtual Care to ask question and Terri [name] referral.  Patient is knowledge  and uses my chart. She would like to talk to Dr. Hope or provider that works with her on how to get her referral. Reason for Disposition  MILD weakness (i.e., does not interfere with ability to work, go to school, normal activities)  (Exception: Mild weakness is Terri [name] chronic symptom.)  Answer Assessment - Initial Assessment Questions 1. MECHANISM: How did the fall happen?      Clemens down two stairs- patient states she laid there by herself Terri [name] couple of minutes then she was able to get herself. 2. DOMESTIC VIOLENCE AND ELDER ABUSE SCREENING: Did you fall because someone pushed you or tried to hurt you? If Yes, ask: Are you safe now?       Denies- Cancer  patient  3. ONSET: When did the fall happen? (e.g., minutes, hours, or days ago)       Patient reports  she got her infusion today and then Cancer doctor for Terri [name] 4. LOCATION: What part of the body hit the ground? (e.g., back, buttocks, head, hips, knees, hands, head, stomach)      5. INJURY: Did you hurt (injure) yourself when you fell? If Yes, ask: What did you injure? Tell me more about this? (e.g., body area; type of injury; pain severity)     Denies only Terri [name] few scratches on wrist, knee 6. PAIN: Is there any pain? If Yes, ask: How bad is the pain? (e.g., Scale 1-10; or mild,  moderate, severe)   - NONE (0): No pain  7. SIZE: For cuts, bruises, or swelling, ask: How large is it? (e.g., inches or centimeters)       Scratches and hasn't really looked at it closely as of yet.  9. OTHER SYMPTOMS: Do you have any other symptoms? (e.g., dizziness, fever, weakness; new onset or worsening).       Chemotherapy and infusion was today. Patient states she tried to get up the first time but couldn't get up the first time 10. CAUSE: What do you think caused the fall (or falling)? (e.g., tripped, dizzy spell)         Patient states she was on the last step and all of Terri [name] sudden she couldn't hold her self any more  and fell  Protocols used: Falls and [facility]

## 2023-05-08 NOTE — Progress Notes (Signed)
 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  INTERVAL HISTORY: Patient returns to clinic today for further evaluation and consideration of cycle 2 of single agent Doxil .  She feels slightly more dizzy today, but has not had any recent falls.  She otherwise feels well.  She has no other neurologic complaints.  She denies any recent fevers or illnesses.  She has a good appetite and denies weight loss.  She has no chest pain, shortness of breath, cough, or hemoptysis.  She denies any nausea, vomiting, constipation, or diarrhea.  She has no urinary complaints.  Patient offers no further specific complaints today.  REVIEW OF SYSTEMS:   Review of Systems  Constitutional:  Positive for malaise/fatigue. Negative for fever and weight loss.  Respiratory: Negative.  Negative for cough, hemoptysis and shortness of breath.   Cardiovascular: Negative.  Negative for chest pain and leg swelling.  Gastrointestinal: Negative.  Negative for abdominal pain, blood in stool and melena.  Genitourinary: Negative.  Negative for dysuria.  Musculoskeletal: Negative.  Negative for back pain and falls.  Skin: Negative.  Negative for rash.  Neurological:  Positive for dizziness. Negative for focal weakness, weakness and headaches.  Psychiatric/Behavioral: Negative.  The patient is not nervous/anxious.     As per HPI. Otherwise, a complete review of systems is negative.  PAST MEDICAL HISTORY: Past Medical History:  Diagnosis Date   Adenomatous colon polyp     Aortic stenosis    Arthritis    Asthma    Claudication (HCC)    COPD (chronic obstructive pulmonary disease) (HCC)    Depression    Diabetes mellitus without complication (HCC)    Esophageal dysphagia    GERD (gastroesophageal reflux disease)    Hyperlipemia    Hypertension    Obesity    Severe obesity (BMI 35.0-35.9 with comorbidity) (HCC)     PAST SURGICAL HISTORY: Past Surgical History:  Procedure Laterality Date   BLADDER SURGERY     COLONOSCOPY WITH PROPOFOL  N/A 08/05/2015   Procedure: COLONOSCOPY WITH PROPOFOL ;  Surgeon: Donnice Vaughn Manes, MD;  Location: [facility] ENDOSCOPY;  Service: Endoscopy;  Laterality: N/A;   ESOPHAGOGASTRODUODENOSCOPY N/A 01/09/2022   Procedure: ESOPHAGOGASTRODUODENOSCOPY (EGD);  Surgeon: Maryruth Ole DASEN, MD;  Location: [facility] ENDOSCOPY;  Service: Endoscopy;  Laterality: N/A;   FOOT SURGERY Left    FRACTURE SURGERY     IR CV LINE INJECTION  06/22/2022   IR IMAGING GUIDED PORT INSERTION  06/05/2022   IR PORT REPAIR CENTRAL VENOUS ACCESS DEVICE  07/02/2022   LOWER EXTREMITY ANGIOGRAPHY Left 02/04/2023   Procedure: Lower Extremity Angiography;  Surgeon: Marea Selinda RAMAN, MD;  Location: [facility] INVASIVE CV LAB;  Service: Cardiovascular;  Laterality: Left;   ORIF ANKLE FRACTURE Left 03/26/2017   Procedure: OPEN REDUCTION INTERNAL FIXATION (ORIF) ANKLE FRACTURE;  Surgeon: Edie Norleen PARAS, MD;  Location: [facility] ORS;  Service: Orthopedics;  Laterality: Left;    FAMILY HISTORY: Family History  Problem Relation Age of Onset   CVA Mother    Diabetes Mother    CAD Father    Breast cancer Neg Hx     ADVANCED DIRECTIVES (Y/N):  N  HEALTH MAINTENANCE: Social History   Tobacco Use   Smoking status: Former    Current packs/day: 1.00    Average packs/day: 1 pack/day for 40.0 years (40.0 ttl pk-yrs)    Types: Cigarettes   Smokeless tobacco: Never   Tobacco comments:    Quite 2015  Vaping Use   Vaping status: Never Used  Substance Use Topics   Alcohol use: No    Drug use: No     Colonoscopy:  PAP:  Bone density:  Lipid panel:  No Known Allergies  Current Outpatient Medications  Medication Sig Dispense Refill   albuterol  (VENTOLIN  HFA) 108 (90 Base) MCG/ACT inhaler Inhale 2 puffs into the lungs every 6 (six) hours as needed for wheezing or shortness of breath. 1 each 1   amLODipine  (NORVASC ) 10 MG tablet Take 10 mg by mouth daily.     aspirin  EC 81 MG tablet Take 1 tablet (81 mg total) by mouth daily. Swallow whole. 150 tablet 2   atorvastatin  (LIPITOR) 80 MG tablet Take 80 mg by mouth daily.     buPROPion  (WELLBUTRIN  XL) 300 MG 24 hr tablet TAKE 1 TABLET BY MOUTH DAILY 90 tablet 3   Cholecalciferol  (VITAMIN D3) 50 MCG (2000 UT) capsule Take 2,000 Units by mouth daily.     citalopram  (CELEXA ) 20 MG tablet Take 20 mg by mouth daily.     cloNIDine  (CATAPRES ) 0.2 MG tablet Take 0.2 mg by mouth 2 (two) times daily.     clopidogrel  (PLAVIX ) 75 MG tablet Take 1 tablet (75 mg total) by mouth daily. 90 tablet 3   cyanocobalamin  (VITAMIN B12) 1000 MCG tablet Take 1 tablet (1,000 mcg total) by mouth daily. 90 tablet 3   fluticasone -salmeterol (WIXELA INHUB) 250-50 MCG/ACT AEPB Inhale 1 puff into the lungs in the morning and at bedtime. 180 each 6   glipiZIDE  (GLUCOTROL  XL) 10 MG 24 hr tablet Take 1 tablet (10 mg total) by mouth daily. Pt dropped bottle and is aware that insurance may not pay yet. May need cash pricing or coupon. Thank you. 30 tablet 0   insulin  aspart (NOVOLOG ) 100 UNIT/ML injection Inject 13 Units into the skin 3 (three) times daily before meals.     insulin  detemir (LEVEMIR  FLEXPEN) 100 UNIT/ML FlexPen Inject 25 units in the morning. Inject 23 units in the evening.     Iron , Ferrous Sulfate , 325 (65 Fe) MG TABS Take 325 mg by mouth every other day. 90 tablet 3   losartan -hydrochlorothiazide  (HYZAAR) 100-25 MG tablet Take 1 tablet by mouth daily.     metoprolol  succinate (TOPROL -XL) 50 MG 24 hr tablet Take 1 tablet by mouth daily.      montelukast  (SINGULAIR ) 10 MG tablet Take 10 mg by mouth at bedtime.     ondansetron  (ZOFRAN ) 8 MG tablet Take 1 tablet (8 mg total) by mouth every 8 (eight) hours as needed for nausea or vomiting. 30 tablet 0   ONETOUCH VERIO test strip 1 each by Other route 3 (three) times daily. 100 each 10   Semaglutide ,0.25 or 0.5MG /DOS, 2 MG/3ML SOPN Inject 0.25 mg into the skin once a week. 3 mL 3   Semaglutide ,0.25 or 0.5MG /DOS, 2 MG/3ML SOPN Inject 0.25 mg into the skin once a week.     No current facility-administered medications for this visit.   Facility-Administered Medications Ordered in Other Visits  Medication Dose Route Frequency Provider Last Rate Last Admin   dextrose  5 % solution   Intravenous Continuous Jacobo Evalene PARAS,  MD 10 mL/hr at 05/08/23 1009 New Bag at 05/08/23 1009   DOXOrubicin  HCL LIPOSOMAL (DOXIL ) 100 mg in dextrose  5 % 500 mL chemo infusion  50 mg/m2 (Treatment Plan Recorded) Intravenous Once Salvatore Shear J, MD 366.7 mL/hr at 05/08/23 1128 100 mg at 05/08/23 1128   heparin  lock flush 100 unit/mL  500 Units Intracatheter Once PRN Jacobo Evalene PARAS, MD        OBJECTIVE: Vitals:   05/08/23 0908  BP: (!) 147/46  Pulse: 66  Temp: 97.9 F (36.6 C)  SpO2: 96%     Body mass index is 32.74 kg/m.    ECOG FS:0 - Asymptomatic  General: Well-developed, well-nourished, no acute distress. Eyes: Pink conjunctiva, anicteric sclera. HEENT: Normocephalic, moist mucous membranes. Lungs: No audible wheezing or coughing. Heart: Regular rate and rhythm. Abdomen: Soft, nontender, no obvious distention. Musculoskeletal: No edema, cyanosis, or clubbing. Neuro: Alert, answering all questions appropriately. Cranial nerves grossly intact. Skin: No rashes or petechiae noted. Psych: Normal affect.  LAB RESULTS:  Lab Results  Component Value Date   NA 138 05/08/2023   K 4.0 05/08/2023   CL 100 05/08/2023   CO2 27 05/08/2023   GLUCOSE 247 (H) 05/08/2023   BUN 15 05/08/2023    CREATININE 1.04 (H) 05/08/2023   CALCIUM  8.5 (L) 05/08/2023   PROT 5.8 (L) 05/08/2023   ALBUMIN 2.8 (L) 05/08/2023   AST 15 05/08/2023   ALT 13 05/08/2023   ALKPHOS 66 05/08/2023   BILITOT 0.6 05/08/2023   GFRNONAA 57 (L) 05/08/2023   GFRAA >60 03/27/2017    Lab Results  Component Value Date   WBC 8.6 05/08/2023   NEUTROABS 7.2 05/08/2023   HGB 8.6 (L) 05/08/2023   HCT 27.3 (L) 05/08/2023   MCV 84.5 05/08/2023   PLT 256 05/08/2023     STUDIES: NM Cardiac Muga Rest Result Date: 04/09/2023 CLINICAL DATA:  Endometrial cancer.  Pre chemotherapy. EXAM: NUCLEAR MEDICINE CARDIAC BLOOD POOL IMAGING (MUGA) TECHNIQUE: Cardiac multi-gated acquisition was performed at rest following intravenous injection of Tc-15m labeled red blood cells. RADIOPHARMACEUTICALS:  21.57 mCi Tc-39m pertechnetate in-vitro labeled red blood cells IV COMPARISON:  None Available. FINDINGS: Normal left ventricular wall motion. No akinetic or dyskinetic segments are identified. The ejection fraction is calculated at 66.3%. Incidental note is made of moderate diffuse uptake in the spleen. This can be seen with hemolytic anemia. Do not see any abnormality in the spleen on the recent PET-CT. IMPRESSION: Normal left ventricular motion with calculated ejection fraction of 66.3%. Electronically Signed   By: MYRTIS Stammer M.D.   On: 04/09/2023 15:16    ONCOLOGY HISTORY: Patient was initially hesitant to undergo chemotherapy and elected to do XRT only which was completed on April 18, 2022.  CT scan results from October 08, 2022 reviewed independently with no obvious evidence of progressive disease. She completed 6 cycles of carboplatinum, Taxol , and Keytruda  on October 03, 2022.  Patient then initiated maintenance Keytruda  on October 23, 2022.  PET scan results from January 15, 2023 reviewed independently with mild nonspecific residual hypermetabolism in the lower uterine segment as well as small bilateral common iliac nodes possibly  suggesting nodal metastasis.  Patient was seen by gynecology oncology who determined surgical intervention is not an option.  They recommend discontinuing Keytruda  for progressive disease and initiating single agent Doxil  every 28 days.   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  MUGA on April 09, 2023 revealed an EF of 66%.  Repeat in March 2025.  Proceed with cycle 2 of single agent Doxil  today.  Return to clinic in 2 weeks for laboratory work and further evaluation and then in 4 weeks for further evaluation and consideration of cycle 3. Will reimage after cycle 3 with the plan of doing 6 total cycles.   Port: Port revision successful.  Proceed with treatment as above. Leukopenia: Resolved. Anemia: Return to clinic in 2 weeks as above for laboratory work and consideration of blood transfusion.   Thrombocytopenia: Resolved. Hyperglycemia: Patient's blood glucose is moderately elevated today.  Continue monitoring and treatment per primary care. Claudication symptoms: Resolved.  Patient underwent revascularization procedure on February 04, 2023. Right hamstring tendon rupture: Patient reports there is no plan for surgical repair.  Patient expressed understanding and was in agreement with this plan. She also understands that She can call clinic at any time with any questions, concerns, or complaints.    Cancer Staging  Endometrial cancer [facility]) Staging form: Corpus Uteri - Carcinoma and Carcinosarcoma, AJCC 8th Edition - Clinical stage from 05/30/2022: FIGO Stage IIIB (cT3b, cN0, cM0) - Signed by Jacobo Evalene PARAS, MD on 05/30/2022 Stage prefix: Initial diagnosis   Evalene PARAS Jacobo, MD   05/08/2023 12:40 PM

## 2023-05-09 ENCOUNTER — Other Ambulatory Visit: Payer: Self-pay | Admitting: *Deleted

## 2023-05-09 ENCOUNTER — Telehealth: Payer: PPO

## 2023-05-09 ENCOUNTER — Telehealth: Payer: Self-pay

## 2023-05-09 ENCOUNTER — Encounter: Payer: Self-pay | Admitting: Oncology

## 2023-05-09 DIAGNOSIS — C541 Malignant neoplasm of endometrium: Secondary | ICD-10-CM

## 2023-05-09 DIAGNOSIS — R296 Repeated falls: Secondary | ICD-10-CM

## 2023-05-09 NOTE — Telephone Encounter (Signed)
 Clinical Social Work was referred by medical provider, Dr. Orlie Dakin, for assessment of psychosocial needs.  CSW attempted to contact patient by phone.  Left voicemail with contact information and request for return call.

## 2023-05-14 DIAGNOSIS — I739 Peripheral vascular disease, unspecified: Secondary | ICD-10-CM | POA: Insufficient documentation

## 2023-05-14 DIAGNOSIS — I350 Nonrheumatic aortic (valve) stenosis: Secondary | ICD-10-CM | POA: Diagnosis not present

## 2023-05-14 DIAGNOSIS — E669 Obesity, unspecified: Secondary | ICD-10-CM | POA: Diagnosis not present

## 2023-05-14 DIAGNOSIS — E782 Mixed hyperlipidemia: Secondary | ICD-10-CM | POA: Diagnosis not present

## 2023-05-14 DIAGNOSIS — I10 Essential (primary) hypertension: Secondary | ICD-10-CM | POA: Diagnosis not present

## 2023-05-14 NOTE — Telephone Encounter (Signed)
Printed out and place in provider box

## 2023-05-15 NOTE — Telephone Encounter (Signed)
 Faxed and sent to scan to be placed in pt chart.

## 2023-05-20 ENCOUNTER — Inpatient Hospital Stay: Payer: PPO | Admitting: Student in an Organized Health Care Education/Training Program

## 2023-05-20 ENCOUNTER — Ambulatory Visit: Payer: PPO

## 2023-05-21 ENCOUNTER — Ambulatory Visit: Payer: PPO

## 2023-05-22 ENCOUNTER — Encounter: Payer: Self-pay | Admitting: Oncology

## 2023-05-22 ENCOUNTER — Inpatient Hospital Stay: Payer: PPO | Admitting: Occupational Therapy

## 2023-05-22 ENCOUNTER — Inpatient Hospital Stay: Payer: PPO

## 2023-05-22 ENCOUNTER — Inpatient Hospital Stay: Payer: PPO | Admitting: Oncology

## 2023-05-22 ENCOUNTER — Encounter: Payer: Self-pay | Admitting: Occupational Therapy

## 2023-05-23 ENCOUNTER — Inpatient Hospital Stay: Payer: PPO

## 2023-05-24 ENCOUNTER — Telehealth: Payer: Self-pay

## 2023-05-24 NOTE — Telephone Encounter (Signed)
Patient assistance meds are available for pick up      Medication: NovoFine 32 G tip    QTY: 3 boxes      1 bag have been labeled and placed in designated pick up area   Pt will pick up 06/26/2023 at office visit.    Aleatha Borer, CMA

## 2023-05-27 ENCOUNTER — Encounter: Payer: Self-pay | Admitting: Pharmacist

## 2023-05-27 NOTE — Progress Notes (Unsigned)
Manufacturer Assistance Program (MAP) Application   Manufacturer: [employer] - Medication Change Request Medication(s): ADD Ozempic 0.25/0.5 to current enrollment    Form sent to [facility] December 2024.  Form re-sent early Jan 2025.   As of 05/27/23, Ozempic still not listed as one of patient's medications on [employer] automated system.   Called and spoke with [employer] rep on 05/27/23.  They report they have received our forms They report that forms are filled out completely, no information is missing at this time. They are unable to provide a reason why the form has not been processed in the 2+ months.  Rep confirms she has processed medication form and medication should ship out within 10-14 business days.

## 2023-06-05 ENCOUNTER — Telehealth: Payer: Self-pay

## 2023-06-05 ENCOUNTER — Other Ambulatory Visit: Payer: Self-pay

## 2023-06-05 ENCOUNTER — Encounter: Payer: Self-pay | Admitting: Intensive Care

## 2023-06-05 ENCOUNTER — Encounter: Payer: Self-pay | Admitting: Oncology

## 2023-06-05 ENCOUNTER — Inpatient Hospital Stay: Payer: PPO

## 2023-06-05 ENCOUNTER — Emergency Department
Admission: EM | Admit: 2023-06-05 | Discharge: 2023-06-05 | Disposition: A | Discharge status: Home / Self Care | Payer: PPO | Attending: Emergency Medicine | Admitting: Emergency Medicine

## 2023-06-05 ENCOUNTER — Inpatient Hospital Stay (HOSPITAL_BASED_OUTPATIENT_CLINIC_OR_DEPARTMENT_OTHER): Payer: PPO | Admitting: Oncology

## 2023-06-05 ENCOUNTER — Inpatient Hospital Stay: Payer: PPO | Admitting: Occupational Therapy

## 2023-06-05 ENCOUNTER — Emergency Department: Payer: PPO

## 2023-06-05 ENCOUNTER — Inpatient Hospital Stay: Payer: PPO | Attending: Obstetrics and Gynecology

## 2023-06-05 VITALS — BP 106/78 | HR 67 | Resp 20

## 2023-06-05 VITALS — BP 140/62 | HR 89 | Temp 97.8°F | Resp 18 | Wt 171.0 lb

## 2023-06-05 DIAGNOSIS — R21 Rash and other nonspecific skin eruption: Secondary | ICD-10-CM | POA: Insufficient documentation

## 2023-06-05 DIAGNOSIS — D649 Anemia, unspecified: Secondary | ICD-10-CM | POA: Diagnosis not present

## 2023-06-05 DIAGNOSIS — S0990XA Unspecified injury of head, initial encounter: Secondary | ICD-10-CM | POA: Insufficient documentation

## 2023-06-05 DIAGNOSIS — Z794 Long term (current) use of insulin: Secondary | ICD-10-CM | POA: Insufficient documentation

## 2023-06-05 DIAGNOSIS — S0083XA Contusion of other part of head, initial encounter: Secondary | ICD-10-CM | POA: Diagnosis not present

## 2023-06-05 DIAGNOSIS — C541 Malignant neoplasm of endometrium: Secondary | ICD-10-CM

## 2023-06-05 DIAGNOSIS — Z5111 Encounter for antineoplastic chemotherapy: Secondary | ICD-10-CM | POA: Insufficient documentation

## 2023-06-05 DIAGNOSIS — E11649 Type 2 diabetes mellitus with hypoglycemia without coma: Secondary | ICD-10-CM | POA: Diagnosis not present

## 2023-06-05 DIAGNOSIS — Z7984 Long term (current) use of oral hypoglycemic drugs: Secondary | ICD-10-CM | POA: Diagnosis not present

## 2023-06-05 DIAGNOSIS — J449 Chronic obstructive pulmonary disease, unspecified: Secondary | ICD-10-CM | POA: Insufficient documentation

## 2023-06-05 DIAGNOSIS — E119 Type 2 diabetes mellitus without complications: Secondary | ICD-10-CM | POA: Insufficient documentation

## 2023-06-05 DIAGNOSIS — R296 Repeated falls: Secondary | ICD-10-CM

## 2023-06-05 DIAGNOSIS — S0181XA Laceration without foreign body of other part of head, initial encounter: Secondary | ICD-10-CM

## 2023-06-05 DIAGNOSIS — I10 Essential (primary) hypertension: Secondary | ICD-10-CM | POA: Insufficient documentation

## 2023-06-05 DIAGNOSIS — W01198A Fall on same level from slipping, tripping and stumbling with subsequent striking against other object, initial encounter: Secondary | ICD-10-CM | POA: Diagnosis not present

## 2023-06-05 DIAGNOSIS — S0993XA Unspecified injury of face, initial encounter: Secondary | ICD-10-CM | POA: Diagnosis present

## 2023-06-05 DIAGNOSIS — S2231XA Fracture of one rib, right side, initial encounter for closed fracture: Secondary | ICD-10-CM | POA: Diagnosis not present

## 2023-06-05 DIAGNOSIS — Z8542 Personal history of malignant neoplasm of other parts of uterus: Secondary | ICD-10-CM | POA: Insufficient documentation

## 2023-06-05 DIAGNOSIS — S01411A Laceration without foreign body of right cheek and temporomandibular area, initial encounter: Secondary | ICD-10-CM | POA: Insufficient documentation

## 2023-06-05 DIAGNOSIS — R9082 White matter disease, unspecified: Secondary | ICD-10-CM | POA: Diagnosis not present

## 2023-06-05 HISTORY — DX: Malignant (primary) neoplasm, unspecified: C80.1

## 2023-06-05 LAB — CMP (CANCER CENTER ONLY)
ALT: 10 U/L (ref 0–44)
AST: 16 U/L (ref 15–41)
Albumin: 3.2 g/dL — ABNORMAL LOW (ref 3.5–5.0)
Alkaline Phosphatase: 59 U/L (ref 38–126)
Anion gap: 9 (ref 5–15)
BUN: 16 mg/dL (ref 8–23)
CO2: 28 mmol/L (ref 22–32)
Calcium: 8.7 mg/dL — ABNORMAL LOW (ref 8.9–10.3)
Chloride: 104 mmol/L (ref 98–111)
Creatinine: 1.15 mg/dL — ABNORMAL HIGH (ref 0.44–1.00)
GFR, Estimated: 50 mL/min — ABNORMAL LOW (ref >60)
Glucose, Bld: 106 mg/dL — ABNORMAL HIGH (ref 70–99)
Potassium: 3.3 mmol/L — ABNORMAL LOW (ref 3.5–5.1)
Sodium: 141 mmol/L (ref 135–145)
Total Bilirubin: 0.6 mg/dL (ref 0.0–1.2)
Total Protein: 5.7 g/dL — ABNORMAL LOW (ref 6.5–8.1)

## 2023-06-05 LAB — CBC WITH DIFFERENTIAL (CANCER CENTER ONLY)
Abs Immature Granulocytes: 0.02 10*3/uL (ref 0.00–0.07)
Basophils Absolute: 0 10*3/uL (ref 0.0–0.1)
Basophils Relative: 1 %
Eosinophils Absolute: 0 10*3/uL (ref 0.0–0.5)
Eosinophils Relative: 0 %
HCT: 29.4 % — ABNORMAL LOW (ref 36.0–46.0)
Hemoglobin: 9.2 g/dL — ABNORMAL LOW (ref 12.0–15.0)
Immature Granulocytes: 0 %
Lymphocytes Relative: 5 %
Lymphs Abs: 0.3 10*3/uL — ABNORMAL LOW (ref 0.7–4.0)
MCH: 27.3 pg (ref 26.0–34.0)
MCHC: 31.3 g/dL (ref 30.0–36.0)
MCV: 87.2 fL (ref 80.0–100.0)
Monocytes Absolute: 1 10*3/uL (ref 0.1–1.0)
Monocytes Relative: 20 %
Neutro Abs: 3.6 10*3/uL (ref 1.7–7.7)
Neutrophils Relative %: 74 %
Platelet Count: 243 10*3/uL (ref 150–400)
RBC: 3.37 MIL/uL — ABNORMAL LOW (ref 3.87–5.11)
RDW: 20.1 % — ABNORMAL HIGH (ref 11.5–15.5)
WBC Count: 5 10*3/uL (ref 4.0–10.5)
nRBC: 0 % (ref 0.0–0.2)

## 2023-06-05 LAB — SAMPLE TO BLOOD BANK

## 2023-06-05 MED ORDER — DEXAMETHASONE SODIUM PHOSPHATE 10 MG/ML IJ SOLN
10.0000 mg | Freq: Once | INTRAMUSCULAR | Status: AC
  Administered 2023-06-05: 10 mg via INTRAVENOUS
  Filled 2023-06-05: qty 1

## 2023-06-05 MED ORDER — HEPARIN SOD (PORK) LOCK FLUSH 100 UNIT/ML IV SOLN
500.0000 [IU] | Freq: Once | INTRAVENOUS | Status: DC | PRN
  Filled 2023-06-05: qty 5

## 2023-06-05 MED ORDER — DOXORUBICIN HCL LIPOSOMAL CHEMO INJECTION 2 MG/ML
50.0000 mg/m2 | Freq: Once | INTRAVENOUS | Status: AC
  Administered 2023-06-05: 100 mg via INTRAVENOUS
  Filled 2023-06-05: qty 50

## 2023-06-05 MED ORDER — DEXTROSE 5 % IV SOLN
INTRAVENOUS | Status: DC
  Filled 2023-06-05: qty 250

## 2023-06-05 NOTE — ED Triage Notes (Signed)
 Patient from cancer center. Had just completed chemo and went to put her shoes on and lost balance from chair she was in and hit head. Hematoma noted to right side of head.   A&O x4 in triage  Port still accessed from cancer center

## 2023-06-05 NOTE — Progress Notes (Signed)
 Pt completed chemotherapy treatment.  This RN was discharging another patient when patient leaned forward to put her shoes on for discharge.  Pt fell forward out of chair and hit right forehead on the ground.  This RN did not witness fall.  Fall witnessed by [facility], [facility].  Pt noted to have laceration next to right eye which was bleeding and hematoma to right forehead.  Bleeding stopped by holding pressure with gauze.  Pt A&O x 4.  No complaints or distress.  VSS.  Fonda Mower, NP to chair side.  Pt mentioned at this time that she had experienced multiple falls at home.  NP reviewed with Dr Jacobo, MD.  Decision made to transfer pt to ED.  Pt agreeable.  PAC left accessed.  Pt transferred via wheelchair with RNX2.  Pt checked in A&O x4 with triage nurse at ED.

## 2023-06-05 NOTE — Progress Notes (Signed)
 Patient almost fell while I was getting her weight, she is feeling dizzy. Her appetite is gone. Nils Baseman, nurse, gave her some drinks Glucerma drinks to help. She had some constipation over the past weekend, but she took some laxatives.

## 2023-06-05 NOTE — Discharge Instructions (Addendum)
 Your exam and CT scans are normal and reassuring.  Your superficial facial wound has been repaired with Dermabond.  Avoid placing any lotion, cream, oils, ointments on the wound glue.  Take OTC Tylenol  as needed.  Follow-up with primary provider for ongoing concerns.

## 2023-06-05 NOTE — ED Provider Triage Note (Signed)
 Emergency Medicine Provider Triage Evaluation Note  Terri [name] , a 73 y.o. female  was evaluated in triage.  Pt complains of facial laceration. She was leaning over to put her shoes on and fell forward, striking her head on the floor. She has a small laceration over the lateral orbital ring area on the right side. No loss of consciousness.   Physical Exam  BP (!) 152/71 (BP Location: Left Arm)   Pulse 69   Temp 97.9 F (36.6 C) (Oral)   Resp 18   Ht 5' 2 (1.575 m)   Wt 77.6 kg   SpO2 99%   BMI 31.28 kg/m  Gen:   Awake, no distress   Resp:  Normal effort  MSK:   Moves extremities without difficulty  Other:  1cm laceration right lateral eye  Medical Decision Making  Medically screening exam initiated at 1:06 PM.  Appropriate orders placed.  Terri [name] was informed that the remainder of the evaluation will be completed by another provider, this initial triage assessment does not replace that evaluation, and the importance of remaining in the ED until their evaluation is complete.  No active bleeding. CT imaging head and cervical spine ordered.   Terri Kirk NOVAK, FNP 06/05/23 [phone]

## 2023-06-05 NOTE — Patient Instructions (Signed)

## 2023-06-05 NOTE — Therapy (Signed)
 [facility] - A Dept Of [place]. [facility] [address] [place], [place], [address] Phone: [phone]   Fax:  [phone]  Occupational Therapy Screen  Patient Details  Name: Terri [name] MRN: [id number] Date of Birth: Nov 16, 1950 No data recorded  Encounter Date: 06/05/2023   OT End of Session - 06/05/23 1617     Visit Number 0             Past Medical History:  Diagnosis Date   Adenomatous colon polyp    Aortic stenosis    Arthritis    Asthma    Cancer (HCC)    Claudication (HCC)    COPD (chronic obstructive pulmonary disease) (HCC)    Depression    Diabetes mellitus without complication (HCC)    Esophageal dysphagia    GERD (gastroesophageal reflux disease)    Hyperlipemia    Hypertension    Obesity    Severe obesity (BMI 35.0-35.9 with comorbidity) (HCC)     Past Surgical History:  Procedure Laterality Date   BLADDER SURGERY     COLONOSCOPY WITH PROPOFOL  N/A 08/05/2015   Procedure: COLONOSCOPY WITH PROPOFOL ;  Surgeon: Donnice Vaughn Manes, MD;  Location[facility] ENDOSCOPY;  Service: Endoscopy;  Laterality: N/A;   ESOPHAGOGASTRODUODENOSCOPY N/A 01/09/2022   Procedure: ESOPHAGOGASTRODUODENOSCOPY (EGD);  Surgeon: Maryruth Ole DASEN, MD;  Location: [facility] ENDOSCOPY;  Service: Endoscopy;  Laterality: N/A;   FOOT SURGERY Left    FRACTURE SURGERY     IR CV LINE INJECTION  06/22/2022   IR IMAGING GUIDED PORT INSERTION  06/05/2022   IR PORT REPAIR CENTRAL VENOUS ACCESS DEVICE  07/02/2022   LOWER EXTREMITY ANGIOGRAPHY Left 02/04/2023   Procedure: Lower Extremity Angiography;  Surgeon: Marea Selinda RAMAN, MD;  Location: [facility] INVASIVE CV LAB;  Service: Cardiovascular;  Laterality: Left;   ORIF ANKLE FRACTURE Left 03/26/2017   Procedure: OPEN REDUCTION INTERNAL FIXATION (ORIF) ANKLE FRACTURE;  Surgeon: Edie Norleen PARAS, MD;  Location: [facility] ORS;  Service: Orthopedics;  Laterality: Left;    There were no vitals filed for this visit.    Subjective Assessment - 06/05/23 1615     Subjective  I had a few falls the last 6 months.  Somebody phoned me the other day but physical therapy at home.  But I told him I first wanted to see my family doctor at the end of the month.  As well as the vascular doctor    Currently in Pain? 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  MUGA on April 09, 2023 revealed an EF of 66%.  Repeat in March 2025.  Proceed with cycle 2 of single agent Doxil  today.  Return to clinic in 2 weeks for laboratory work and further evaluation and then in 4 weeks for further evaluation and consideration of cycle 3. Will reimage after cycle 3 with the plan of doing 6 total cycles.   Port: Port revision successful.  Proceed with treatment as above. Leukopenia: Resolved. Anemia: Return to clinic in 2 weeks as above for laboratory work and consideration of blood transfusion.   Thrombocytopenia: Resolved. Hyperglycemia: Patient's blood glucose is moderately elevated today.  Continue monitoring and treatment per primary care. Claudication symptoms: Resolved.  Patient underwent  revascularization procedure on February 04, 2023. Right hamstring tendon rupture: Patient reports there is no plan for surgical repair.      OT SCREEN 06/05/23 Patient referred to OT for increased falls the last 6 months.  Patient lives in a two-story house.  Bedroom upstairs.  Patient report has 3 stairs to get in but no railing. Patient denies dizziness.  Report weakness in bilateral lower extremities.  But legs just give away. Patient live alone.  Doing her own housework and cooking.  Patient comes with transportation. Groceries gets delivered. Upon assessment left lower extremity decrease in strength. Attempt to do a balance test was  unable to finish.  Patient cannot stand close eyes, or tandem or keep balance with alternating feet on stepstool on 1 leg and stand. Discussed with patient physical therapy.  Patient report she wants to see her PCP and vascular doctor first before setting up home health physical therapy. Discussed with patient increased fall risk as well as risk for fractures. Attempted to provide patient with 3-4 home exercises to do until home health physical therapy gets 10 overseeing her PCP. Patient reports she would not really do it. Appear by the time this note had a fall or blackout and ended up in the ED.                                  Visit Diagnosis: Falls    Problem List Patient Active Problem List   Diagnosis Date Noted   Personal history of fall 04/28/2023   COPD exacerbation (HCC) 03/29/2023   Dyspnea on exertion 02/02/2023   Vitamin D  deficiency 02/02/2023   Vitamin B 12 deficiency 02/02/2023   Need for hepatitis C screening test 02/02/2023   Screening for osteoporosis 02/02/2023   Need for influenza vaccination 02/02/2023   Diabetic eye exam (HCC) 02/02/2023   Aortic atherosclerosis (HCC) 02/02/2023   Bradycardia 01/14/2023   Anemia 09/28/2022   Postmenopausal bleeding 01/30/2022   Endometrial cancer (HCC) 01/25/2022   Mild aortic stenosis 10/12/2020   Essential hypertension 11/05/2017   Closed fracture of left ankle 03/29/2017   Ankle fracture, left 03/26/2017   Acute respiratory failure with hypoxia (HCC) 08/05/2015   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   Hyperlipemia, mixed 04/05/2015   HTN, goal below 140/80 04/05/2015   Obesity (BMI 30-39.9) 04/05/2015   Type 2 diabetes mellitus with complication, with long-term current use of insulin  (HCC) 04/05/2015   Class 2 severe obesity due to excess calories with serious comorbidity in adult (HCC) 08/19/2013   Obesity, Class II, BMI 35-39.9, with comorbidity 08/19/2013   Chronic obstructive pulmonary disease,  unspecified (HCC) 07/31/2012   Atherosclerosis of native arteries of extremity with intermittent claudication (HCC) 03/12/2012   Tobacco use 03/12/2012   Hyperlipidemia associated with type 2 diabetes mellitus (HCC) 06/06/2011   Hypertension associated with diabetes (HCC) 06/06/2011   Mood disorder (HCC) 06/06/2011   Type 2 diabetes with complication (HCC) 06/06/2011    Ancel Peters, OTR/L,CLT 06/05/2023, 4:18 PM  [facility] - A Dept Of [place]. [facility] [address], [address] [place], [place], [address] Phone: [phone]   Fax:  [phone]  Name: Terri [name] MRN: [id number] Date of Birth: 1950/06/01

## 2023-06-05 NOTE — ED Notes (Signed)
 Pt from [facility] center for falling frontward on head after getting chemo. Pt denies taking a blood thinner.   Port accessed by [facility] center.

## 2023-06-05 NOTE — ED Provider Notes (Signed)
 [facility] Emergency Department Provider Note     Event Date/Time   First MD Initiated Contact with Patient 06/05/23 1544     (approximate)   History   Fall and Head Injury   HPI  Terri [name] is a 73 y.o. female with a history of HTN, DM, COPD, and endometrial cancer, presents to the ED from the cancer center.  Patient was here for routine chemotherapy session, when she bent over in the chair to replace her shoes.  She tripped over after losing her balance, hitting her head on the floor.  Hematoma to the right side of the face without reports of LOC or nosebleed.  Patient denies any other injury at this time.  She is sent to the ED from the cancer center for evaluation and management.  Physical Exam   Triage Vital Signs: ED Triage Vitals  Encounter Vitals Group     BP 06/05/23 1304 (!) 152/71     Systolic BP Percentile --      Diastolic BP Percentile --      Pulse Rate 06/05/23 1304 69     Resp 06/05/23 1304 18     Temp 06/05/23 1304 97.9 F (36.6 C)     Temp Source 06/05/23 1304 Oral     SpO2 06/05/23 1304 99 %     Weight 06/05/23 1306 171 lb (77.6 kg)     Height 06/05/23 1306 5' 2 (1.575 m)     Head Circumference --      Peak Flow --      Pain Score 06/05/23 1304 2     Pain Loc --      Pain Education --      Exclude from Growth Chart --     Most recent vital signs: Vitals:   06/05/23 1304  BP: (!) 152/71  Pulse: 69  Resp: 18  Temp: 97.9 F (36.6 C)  SpO2: 99%    General Awake, no distress. NAD HEENT NCAT, except for a superficial laceration to the right cheek just lateral to the eye. PERRL. EOMI. No rhinorrhea/epistaxis. Mucous membranes are moist.  CV:  Good peripheral perfusion. RRR RESP:  Normal effort. CTA ABD:  No distention.    ED Results / Procedures / Treatments   Labs (all labs ordered are listed, but only abnormal results are displayed) Labs Reviewed - No data to display   EKG   RADIOLOGY  I personally  viewed and evaluated these images as part of my medical decision making, as well as reviewing the written report by the radiologist.  ED Provider Interpretation: No acute findings  CT Head Wo Contrast Result Date: 06/05/2023 CLINICAL DATA:  Fall, head and neck injury EXAM: CT HEAD WITHOUT CONTRAST CT CERVICAL SPINE WITHOUT CONTRAST TECHNIQUE: Multidetector CT imaging of the head and cervical spine was performed following the standard protocol without intravenous contrast. Multiplanar CT image reconstructions of the cervical spine were also generated. RADIATION DOSE REDUCTION: This exam was performed according to the departmental dose-optimization program which includes automated exposure control, adjustment of the mA and/or kV according to patient size and/or use of iterative reconstruction technique. COMPARISON:  12/19/2022 FINDINGS: CT HEAD FINDINGS Brain: No evidence of acute infarction, hemorrhage, hydrocephalus, extra-axial collection or mass lesion/mass effect. Periventricular white matter hypodensity. Vascular: No hyperdense vessel or unexpected calcification. Skull: Normal. Negative for fracture or focal lesion. Sinuses/Orbits: No acute finding. Other: Soft tissue contusion and hematoma of the right forehead (series 2, image 16). CT CERVICAL  SPINE FINDINGS Alignment: Normal. Skull base and vertebrae: No acute fracture. No primary bone lesion or focal pathologic process. Soft tissues and spinal canal: No prevertebral fluid or swelling. No visible canal hematoma. Disc levels: Moderate multilevel cervical disc degenerative disease. Upper chest: Negative. Other: Partially callused, subacute fracture of the posterior right third rib (series 6, image 87). IMPRESSION: 1. No acute intracranial pathology. Small-vessel white matter disease. 2. Soft tissue contusion and hematoma of the right forehead. 3. No fracture or static subluxation of the cervical spine. 4. Moderate multilevel cervical disc degenerative  disease. Electronically Signed   By: Marolyn JONETTA Jaksch M.D.   On: 06/05/2023 14:10   CT Cervical Spine Wo Contrast Result Date: 06/05/2023 CLINICAL DATA:  Fall, head and neck injury EXAM: CT HEAD WITHOUT CONTRAST CT CERVICAL SPINE WITHOUT CONTRAST TECHNIQUE: Multidetector CT imaging of the head and cervical spine was performed following the standard protocol without intravenous contrast. Multiplanar CT image reconstructions of the cervical spine were also generated. RADIATION DOSE REDUCTION: This exam was performed according to the departmental dose-optimization program which includes automated exposure control, adjustment of the mA and/or kV according to patient size and/or use of iterative reconstruction technique. COMPARISON:  12/19/2022 FINDINGS: CT HEAD FINDINGS Brain: No evidence of acute infarction, hemorrhage, hydrocephalus, extra-axial collection or mass lesion/mass effect. Periventricular white matter hypodensity. Vascular: No hyperdense vessel or unexpected calcification. Skull: Normal. Negative for fracture or focal lesion. Sinuses/Orbits: No acute finding. Other: Soft tissue contusion and hematoma of the right forehead (series 2, image 16). CT CERVICAL SPINE FINDINGS Alignment: Normal. Skull base and vertebrae: No acute fracture. No primary bone lesion or focal pathologic process. Soft tissues and spinal canal: No prevertebral fluid or swelling. No visible canal hematoma. Disc levels: Moderate multilevel cervical disc degenerative disease. Upper chest: Negative. Other: Partially callused, subacute fracture of the posterior right third rib (series 6, image 87). IMPRESSION: 1. No acute intracranial pathology. Small-vessel white matter disease. 2. Soft tissue contusion and hematoma of the right forehead. 3. No fracture or static subluxation of the cervical spine. 4. Moderate multilevel cervical disc degenerative disease. Electronically Signed   By: Marolyn JONETTA Jaksch M.D.   On: 06/05/2023 14:10      PROCEDURES:  Critical Care performed: No  .Laceration Repair  Date/Time: 06/05/2023 3:46 PM  Performed by: Loyd Candida LULLA Aldona, PA-C Authorized by: Loyd Candida LULLA Aldona, PA-C   Consent:    Consent obtained:  Verbal   Consent given by:  Patient   Risks, benefits, and alternatives were discussed: yes     Risks discussed:  Pain and poor cosmetic result   Alternatives discussed:  No treatment Universal protocol:    Imaging studies available: yes     Site/side marked: yes     Patient identity confirmed:  Verbally with patient Anesthesia:    Anesthesia method:  None Laceration details:    Location:  Face   Face location:  R cheek   Length (cm):  1   Depth (mm):  3 Exploration:    Limited defect created (wound extended): no     Contaminated: no   Treatment:    Area cleansed with:  Saline   Amount of cleaning:  Standard   Irrigation solution:  Sterile saline   Irrigation volume:  5   Irrigation method:  Tap   Debridement:  None   Undermining:  None   Scar revision: no   Skin repair:    Repair method:  Tissue adhesive Approximation:    Approximation:  Close Repair type:    Repair type:  Simple Post-procedure details:    Dressing:  Open (no dressing)   Procedure completion:  Tolerated well, no immediate complications    MEDICATIONS ORDERED IN ED: Medications - No data to display   IMPRESSION / MDM / ASSESSMENT AND PLAN / ED COURSE  I reviewed the triage vital signs and the nursing notes.                              Differential diagnosis includes, but is not limited to, facial contusion, facial abrasion, facial laceration, facial bone fracture, SDH, cervical sprain, cervical fracture, cervical radiculopathy  Patient's presentation is most consistent with acute complicated illness / injury requiring diagnostic workup.  Patient's diagnosis is consistent with facial contusion after minor head injury.  Patient presents with a small superficial  laceration to the lateral right eye after she tipped out of her chair.  No LOC, nausea, vomiting, weakness reported.  Patient with reassuring exam and workup at this time.  CTs reviewed by me and negative for any acute findings.. Patient will be discharged home with wound care instructions after her superficial wound is closed with Dermabond. Patient is to follow up with her PCP or specialist as discussed, as needed or otherwise directed. Patient is given ED precautions to return to the ED for any worsening or new symptoms.  FINAL CLINICAL IMPRESSION(S) / ED DIAGNOSES   Final diagnoses:  Minor head injury, initial encounter  Contusion of face, initial encounter  Facial laceration, initial encounter     Rx / DC Orders   ED Discharge Orders     None        Note:  This document was prepared using Dragon voice recognition software and may include unintentional dictation errors.    Loyd Candida LULLA Aldona, PA-C 06/05/23 1549    Viviann Pastor, MD 06/05/23 2139

## 2023-06-05 NOTE — Telephone Encounter (Signed)
 Copied from CRM [id number]. Topic: General - Other >> Jun 05, 2023 10:18 AM Drema MATSU wrote: Reason for CRM: Marjorie with [facility] stated that Ms. Nikkel was in clinic today and she said that it sounds  like patient blood sugars are running low at home than normally. Patient does have a log of her blood sugars.

## 2023-06-05 NOTE — Telephone Encounter (Signed)
 Left message to return call to our office.

## 2023-06-05 NOTE — Progress Notes (Signed)
 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  INTERVAL HISTORY: Patient returns to clinic today for further evaluation and consideration of cycle 3 of single agent Doxil .  She continues to have occasional dizziness and weakness.  She has a decreased appetite and occasional bouts of hypoglycemia.  She also reports increased vaginal bleeding past week.  She has no other neurologic complaints.  She denies any recent fevers or illnesses.  She has no chest pain, shortness of breath, cough, or hemoptysis.  She denies any nausea, vomiting, constipation, or diarrhea.  She has no urinary complaints.  Patient offers no further specific complaints today.  REVIEW OF SYSTEMS:   Review of Systems  Constitutional:  Positive for malaise/fatigue. Negative for fever and weight loss.  Respiratory: Negative.  Negative for cough, hemoptysis and shortness of breath.   Cardiovascular: Negative.  Negative for chest pain and leg swelling.  Gastrointestinal: Negative.  Negative for abdominal pain, blood in stool and melena.  Genitourinary: Negative.  Negative for dysuria.  Musculoskeletal: Negative.  Negative for back pain and falls.  Skin: Negative.  Negative for rash.  Neurological:  Positive for dizziness and weakness. Negative for focal weakness and headaches.  Psychiatric/Behavioral: Negative.  The patient is not nervous/anxious.     As per HPI. Otherwise, a complete review of systems is negative.  PAST MEDICAL HISTORY: Past Medical History:   Diagnosis Date   Adenomatous colon polyp    Aortic stenosis    Arthritis    Asthma    Claudication (HCC)    COPD (chronic obstructive pulmonary disease) (HCC)    Depression    Diabetes mellitus without complication (HCC)    Esophageal dysphagia    GERD (gastroesophageal reflux disease)    Hyperlipemia    Hypertension    Obesity    Severe obesity (BMI 35.0-35.9 with comorbidity) (HCC)     PAST SURGICAL HISTORY: Past Surgical History:  Procedure Laterality Date   BLADDER SURGERY     COLONOSCOPY WITH PROPOFOL  N/A 08/05/2015   Procedure: COLONOSCOPY WITH PROPOFOL ;  Surgeon: Donnice Vaughn Manes, MD;  Location: [facility] ENDOSCOPY;  Service: Endoscopy;  Laterality: N/A;   ESOPHAGOGASTRODUODENOSCOPY N/A 01/09/2022   Procedure: ESOPHAGOGASTRODUODENOSCOPY (EGD);  Surgeon: Maryruth Ole DASEN, MD;  Location: [facility] ENDOSCOPY;  Service: Endoscopy;  Laterality: N/A;   FOOT SURGERY Left    FRACTURE SURGERY     IR CV LINE INJECTION  06/22/2022   IR IMAGING GUIDED PORT INSERTION  06/05/2022   IR PORT REPAIR CENTRAL VENOUS ACCESS DEVICE  07/02/2022   LOWER EXTREMITY ANGIOGRAPHY Left 02/04/2023   Procedure: Lower Extremity Angiography;  Surgeon: Marea Selinda RAMAN, MD;  Location: [facility] INVASIVE CV LAB;  Service: Cardiovascular;  Laterality: Left;   ORIF ANKLE FRACTURE Left 03/26/2017   Procedure: OPEN REDUCTION INTERNAL FIXATION (ORIF) ANKLE FRACTURE;  Surgeon: Edie Norleen PARAS, MD;  Location: [facility] ORS;  Service: Orthopedics;  Laterality: Left;    FAMILY HISTORY: Family History  Problem Relation Age of Onset   CVA Mother    Diabetes Mother    CAD Father    Breast cancer Neg Hx     ADVANCED DIRECTIVES (  Y/N):  N  HEALTH MAINTENANCE: Social History   Tobacco Use   Smoking status: Former    Current packs/day: 1.00    Average packs/day: 1 pack/day for 40.0 years (40.0 ttl pk-yrs)    Types: Cigarettes   Smokeless tobacco: Never   Tobacco comments:    Quite 2015  Vaping Use   Vaping status: Never Used   Substance Use Topics   Alcohol use: No   Drug use: No     Colonoscopy:  PAP:  Bone density:  Lipid panel:  No Known Allergies  Current Outpatient Medications  Medication Sig Dispense Refill   albuterol  (VENTOLIN  HFA) 108 (90 Base) MCG/ACT inhaler Inhale 2 puffs into the lungs every 6 (six) hours as needed for wheezing or shortness of breath. 1 each 1   amLODipine  (NORVASC ) 10 MG tablet Take 10 mg by mouth daily.     aspirin  EC 81 MG tablet Take 1 tablet (81 mg total) by mouth daily. Swallow whole. 150 tablet 2   atorvastatin  (LIPITOR) 80 MG tablet Take 80 mg by mouth daily.     buPROPion  (WELLBUTRIN  XL) 300 MG 24 hr tablet TAKE 1 TABLET BY MOUTH DAILY 90 tablet 3   Cholecalciferol  (VITAMIN D3) 50 MCG (2000 UT) capsule Take 2,000 Units by mouth daily.     citalopram  (CELEXA ) 20 MG tablet Take 20 mg by mouth daily.     cloNIDine  (CATAPRES ) 0.2 MG tablet Take 0.2 mg by mouth 2 (two) times daily.     clopidogrel  (PLAVIX ) 75 MG tablet Take 1 tablet (75 mg total) by mouth daily. 90 tablet 3   cyanocobalamin  (VITAMIN B12) 1000 MCG tablet Take 1 tablet (1,000 mcg total) by mouth daily. 90 tablet 3   fluticasone -salmeterol (WIXELA INHUB) 250-50 MCG/ACT AEPB Inhale 1 puff into the lungs in the morning and at bedtime. 180 each 6   glipiZIDE  (GLUCOTROL  XL) 10 MG 24 hr tablet Take 1 tablet (10 mg total) by mouth daily. Pt dropped bottle and is aware that insurance may not pay yet. May need cash pricing or coupon. Thank you. 30 tablet 0   insulin  aspart (NOVOLOG ) 100 UNIT/ML injection Inject 13 Units into the skin 3 (three) times daily before meals.     insulin  detemir (LEVEMIR  FLEXPEN) 100 UNIT/ML FlexPen Inject 25 units in the morning. Inject 23 units in the evening.     Iron , Ferrous Sulfate , 325 (65 Fe) MG TABS Take 325 mg by mouth every other day. 90 tablet 3   losartan -hydrochlorothiazide  (HYZAAR) 100-25 MG tablet Take 1 tablet by mouth daily.     metoprolol  succinate (TOPROL -XL) 50 MG 24 hr  tablet Take 1 tablet by mouth daily.     montelukast  (SINGULAIR ) 10 MG tablet Take 10 mg by mouth at bedtime.     ondansetron  (ZOFRAN ) 8 MG tablet Take 1 tablet (8 mg total) by mouth every 8 (eight) hours as needed for nausea or vomiting. 30 tablet 0   ONETOUCH VERIO test strip 1 each by Other route 3 (three) times daily. 100 each 10   Semaglutide ,0.25 or 0.5MG /DOS, 2 MG/3ML SOPN Inject 0.25 mg into the skin once a week. 3 mL 3   Semaglutide ,0.25 or 0.5MG /DOS, 2 MG/3ML SOPN Inject 0.25 mg into the skin once a week.     No current facility-administered medications for this visit.    OBJECTIVE: Vitals:   06/05/23 0902  BP: (!) 140/62  Pulse: 89  Resp: 18  Temp: 97.8 F (36.6 C)  SpO2: 96%  Body mass index is 31.28 kg/m.    ECOG FS:1 - Symptomatic but completely ambulatory  General: Well-developed, well-nourished, no acute distress. Eyes: Pink conjunctiva, anicteric sclera. HEENT: Normocephalic, moist mucous membranes. Lungs: No audible wheezing or coughing. Heart: Regular rate and rhythm. Abdomen: Soft, nontender, no obvious distention. Musculoskeletal: No edema, cyanosis, or clubbing. Neuro: Alert, answering all questions appropriately. Cranial nerves grossly intact. Skin: No rashes or petechiae noted. Psych: Normal affect.  LAB RESULTS:  Lab Results  Component Value Date   NA 141 06/05/2023   K 3.3 (L) 06/05/2023   CL 104 06/05/2023   CO2 28 06/05/2023   GLUCOSE 106 (H) 06/05/2023   BUN 16 06/05/2023   CREATININE 1.15 (H) 06/05/2023   CALCIUM  8.7 (L) 06/05/2023   PROT 5.7 (L) 06/05/2023   ALBUMIN 3.2 (L) 06/05/2023   AST 16 06/05/2023   ALT 10 06/05/2023   ALKPHOS 59 06/05/2023   BILITOT 0.6 06/05/2023   GFRNONAA 50 (L) 06/05/2023   GFRAA >60 03/27/2017    Lab Results  Component Value Date   WBC 5.0 06/05/2023   NEUTROABS 3.6 06/05/2023   HGB 9.2 (L) 06/05/2023   HCT 29.4 (L) 06/05/2023   MCV 87.2 06/05/2023   PLT 243 06/05/2023     STUDIES: No  results found.   ONCOLOGY HISTORY: Patient was initially hesitant to undergo chemotherapy and elected to do XRT only which was completed on April 18, 2022.  CT scan results from October 08, 2022 reviewed independently with no obvious evidence of progressive disease. She completed 6 cycles of carboplatinum, Taxol , and Keytruda  on October 03, 2022.  Patient then initiated maintenance Keytruda  on October 23, 2022.  PET scan results from January 15, 2023 reviewed independently with mild nonspecific residual hypermetabolism in the lower uterine segment as well as small bilateral common iliac nodes possibly suggesting nodal metastasis.  Patient was seen by gynecology oncology who determined surgical intervention is not an option.  They recommend discontinuing Keytruda  for progressive disease and initiating single agent Doxil  every 28 days.   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  MUGA on April 09, 2023 revealed an EF of 66%.  Proceed with cycle 3 of single agent Doxil  today.  Return to clinic next week for evaluation by gynecology oncology.  Patient will also require PET scan and MUGA scan prior to next treatment.  Return to clinic in 4 weeks for further evaluation and consideration of cycle 4.  Plan to do a total of 6 cycles.   Port: Port revision successful.  Proceed with treatment as above. Leukopenia: Resolved. Anemia: Hemoglobin improved to 9.2, monitor. Thrombocytopenia: Resolved. Hypoglycemia: Referral has been given back to primary care for consideration of adjusting her diabetes medications.  Patient was also given a referral to dietary.   Vaginal bleeding: Patient declined evaluation by gynecology oncology today, but agreed to return in 1 week. Claudication symptoms: Resolved.  Patient underwent revascularization procedure on February 04, 2023. Right hamstring tendon rupture: Patient reports there is no plan for surgical repair.  Patient expressed understanding and was in agreement with this plan. She also understands that She can call clinic at any time with any questions, concerns, or complaints.    Cancer Staging  Endometrial cancer [facility]) Staging form: Corpus Uteri - Carcinoma and Carcinosarcoma, AJCC 8th Edition - Clinical stage from 05/30/2022: FIGO Stage IIIB (cT3b, cN0, cM0) - Signed  by Jacobo Evalene PARAS, MD on 05/30/2022 Stage prefix: Initial diagnosis   Evalene PARAS Jacobo, MD   06/05/2023 9:19 AM

## 2023-06-07 ENCOUNTER — Encounter: Payer: Self-pay | Admitting: Pharmacist

## 2023-06-07 ENCOUNTER — Telehealth: Payer: Self-pay

## 2023-06-07 NOTE — Progress Notes (Unsigned)
 Manufacturer Assistance Program (MAP) Application   Manufacturer: [employer] - New Medication Request Form Medication(s): ADD Ozempic  0.25/0.5 to current enrollment   04/08/23:  Re-enrollment application for 2025 including Tresiba, NovoFine, Novolog , Ozempic  (has been approved for all except Ozempic ).   05/07/23:  New Medication Request Form re-sent early Jan 2025.   05/14/23:  New Medication Request Form re-sent via hard copy with wet signature from PCP 05/14/2023  05/27/23 Ozempic  still not listed as one of patient's medications on [employer] automated system.   Called and spoke with [employer] rep on 05/27/23.  They report they have received our forms They report that forms are filled out completely, no information is missing at this time. They are unable to provide a reason why the form has not been processed in the 2+ months.  Rep confirms she has processed medication form and medication should ship out within 10-14 business days.    06/07/23 Ozempic  still not listed as one of patient's medications on [employer] automated system.   Called and spoke with [employer] rep on 06/07/23 They report they have received our forms They report that forms are filled out completely, no information is missing at this time. They are unable to provide a reason why the form has not been processed in the 2+ months.  Rep confirms she has processed medication form and medication should ship out within 10-14 business days.  Requested 30-day voucher and was shortly thereafter disconnected from rep  06/07/23:  Ozempic  now at least is listed on the automated system under medications. Status in process.  Patient does have samples at this time  2/14: Spoke with [employer] to see if they would be able to provide a voucher since this medication request was submitted over 2 months ago.  [employer] denies stating she must wait until the 14 business days after processing was initiated on the 7th.. (14 business days = 06/27/23) [employer] is unable to  provide rationale for why patient's application expires in May 2025 rather than December.

## 2023-06-07 NOTE — Telephone Encounter (Signed)
 Copied from CRM [id number]. Topic: Clinical - Medication Question >> Jun 07, 2023  2:55 PM Franky GRADE wrote: Reason for CRM: Patient was told to call the office to speak with Dr.Walsh or her nurse regarding a medication she does not remember the name and her Ozempic . Best call back number is [phone].

## 2023-06-07 NOTE — Telephone Encounter (Signed)
Spoke to pt see previous note 

## 2023-06-10 ENCOUNTER — Other Ambulatory Visit (INDEPENDENT_AMBULATORY_CARE_PROVIDER_SITE_OTHER): Payer: PPO | Admitting: Pharmacist

## 2023-06-10 DIAGNOSIS — Z794 Long term (current) use of insulin: Secondary | ICD-10-CM

## 2023-06-10 DIAGNOSIS — E118 Type 2 diabetes mellitus with unspecified complications: Secondary | ICD-10-CM

## 2023-06-10 NOTE — Progress Notes (Signed)
06/10/2023 Name: Terri [name] MRN: [id number] DOB: Nov 13, 1950  Care Team: Primary Care Provider: Dana Allan, MD  Reason for Call: Patient called clinic requesting phone call from pharmacist regarding her medication list s/p recent ED encounter/ c/f changes or discrepancies.    Terri [name] is a 73 y.o. female who presents today for a phone visit with the pharmacist for a medication review/reconciliation visit.   Medication Access/Adherence: ?  Prescription drug coverage: Payor: [employer] / Plan: [employer] PPO / Product Type: *No Product type* / .   HPI: ED visit 06/05/23 for minor head injury w laceration after slipping from her chemo chair. Notably, no changes to mediation list on discharge. States the discrepancy she is referring to is Novolog dosing reflected on her discharge med list was out of date. Has since been updated by CMA.   Patient reports she is finally out of her Levemir and ready to transition to her Evaristo Bury in the next month (Has been receiving [place] form Novo PAP though did not want to discard her excess Levemir insulin).   Reports tolerating Ozempic 0.25 mg well without concerns. Has ~4 doses remaining on current sample pen at home.   Current Regimen:  Levemir 25 units twice daily Reports one day reducing dose to 15 units which resulted in hyperglycemia the following day. Otherwise denies self-adjustment Novolog 7 units TIDAC Glipizide 10 mg daily Ozempic 0.25 mg sq once weekly  SMBG: Glucometer (checks fasting and evening - logs BG in excel) This morning was 110 mg/dL  Date  FBG  Before bed 2/5 (chemo/hospital s/p her fall)  2/6:   202  2/7:   64  Before dinner 153 +Novolog > 54 (no symptoms which is not normal for her)  1/25:  153  72 1/26:  74  101 1/27:   124  111 1/28:   152  72 1/29:   113  140 1/30:   87  74 1/31:   115  118  BG seems to remain elevated the 1-2 days following chemo though usually resolved by the night after  chemo.   Outpatient Encounter Medications as of 06/10/2023  Medication Sig Note   insulin degludec (TRESIBA FLEXTOUCH) 100 UNIT/ML FlexTouch Pen Inject 40 Units into the skin daily. 40 units once daily - REPLACES Levemir    albuterol (VENTOLIN HFA) 108 (90 Base) MCG/ACT inhaler Inhale 2 puffs into the lungs every 6 (six) hours as needed for wheezing or shortness of breath.    amLODipine (NORVASC) 10 MG tablet Take 10 mg by mouth daily.    aspirin EC 81 MG tablet Take 1 tablet (81 mg total) by mouth daily. Swallow whole.    atorvastatin (LIPITOR) 80 MG tablet Take 80 mg by mouth daily.    buPROPion (WELLBUTRIN XL) 300 MG 24 hr tablet TAKE 1 TABLET BY MOUTH DAILY    Cholecalciferol (VITAMIN D3) 50 MCG (2000 UT) capsule Take 2,000 Units by mouth daily.    citalopram (CELEXA) 20 MG tablet Take 20 mg by mouth daily.    cloNIDine (CATAPRES) 0.2 MG tablet Take 0.2 mg by mouth 2 (two) times daily.    clopidogrel (PLAVIX) 75 MG tablet Take 1 tablet (75 mg total) by mouth daily.    cyanocobalamin (VITAMIN B12) 1000 MCG tablet Take 1 tablet (1,000 mcg total) by mouth daily.    fluticasone-salmeterol (WIXELA INHUB) 250-50 MCG/ACT AEPB Inhale 1 puff into the lungs in the morning and at bedtime.    glipiZIDE (GLUCOTROL XL) 10  MG 24 hr tablet Take 1 tablet (10 mg total) by mouth daily. Pt dropped bottle and is aware that insurance may not pay yet. May need cash pricing or coupon. Thank you.    insulin aspart (NOVOLOG) 100 UNIT/ML injection Inject 7 Units into the skin 3 (three) times daily before meals. 12/25/2022: Patient uses 7 units three times daily   Iron, Ferrous Sulfate, 325 (65 Fe) MG TABS Take 325 mg by mouth every other day.    losartan-hydrochlorothiazide (HYZAAR) 100-25 MG tablet Take 1 tablet by mouth daily.    metoprolol succinate (TOPROL-XL) 50 MG 24 hr tablet Take 1 tablet by mouth daily.    montelukast (SINGULAIR) 10 MG tablet Take 10 mg by mouth at bedtime.    ondansetron (ZOFRAN) 8 MG tablet  Take 1 tablet (8 mg total) by mouth every 8 (eight) hours as needed for nausea or vomiting.    ONETOUCH VERIO test strip 1 each by Other route 3 (three) times daily.    Semaglutide,0.25 or 0.5MG /DOS, 2 MG/3ML SOPN Inject 0.25 mg into the skin once a week.    Semaglutide,0.25 or 0.5MG /DOS, 2 MG/3ML SOPN Inject 0.25 mg into the skin once a week.    [DISCONTINUED] insulin detemir (LEVEMIR FLEXPEN) 100 UNIT/ML FlexPen Inject 25 units in the morning. Inject 23 units in the evening. 12/25/2022: Patient says 25 units twice daily   No facility-administered encounter medications on file as of 06/10/2023.   Assessment and Plan:   Medication Access All medications were reviewed with patient today 2 more weeks of levemir then will start Tresiba.  Levemir 25 mg BID = TDD 50 units: Given BG frequently in the 70s fasting w several instances <70 mg/dL, and otherwise very well controlled, will plan for 20% basal reduction reduction.  Tresiba 40 units once daily with close follow up for titration up as needed if persistently elevated FBG. Future Consideration: Continue Ozempic 0.25 mg weekly.  Once received from PAP, Consider increase to 0.5 mg w discontinuation or reduction of glipizide if ongoing c/f hypoglycemia [employer] processing errors have been a barrier to Ozempic titration - currently using sample from clinic) [employer] again to confirm -- Ozempic has been APPROVED and is in the process of being filled  Loree Fee, PharmD Clinical Pharmacist [facility] [phone]

## 2023-06-10 NOTE — Patient Instructions (Addendum)
 [name],   It was a pleasure to speak with you today! As we discussed:?   Continue all medications as you have been taking them.  Once you run out of your Levemir  insulin , you may start your Tresiba insulin  the following morning which replaces the Levemir .  As we reviewed: Horace Lye is only given once daily Tresiba 40 units each morning.  If your morning sugars are 150 or higher before any food for 3 days in a row, you may increase to 42 units.  If you continue seeing blood sugar readings 70s or below, please reach out.    Please reach out prior to your next scheduled appointment should you have any questions or concerns.   I added you to my schedule for "phone visit" in 2.5 weeks but this is more of a reminder to myself to check in. If this time does not work for you, that is okay! This is flexible.   ________________________________________________________  The Novo Nordisk program has finally gotten the issue resolved with Ozempic  showing up on your 2025 enrollment.  I have confirmed with [facility] today that the Ozempic  is currently in the processes of being filled. They report that this medication should be delivered within 10-14 business days (we will call you as soon as it is delivered to clinic).  Updates on orders can also be checked via their automated phone system.   Novo Nordisk Patient Assistance Foundation [place]) Medication: Horace Lye, Novolog , NovoFine needles, Ozempic  Phone: [phone] M-F 8am-8pm ET     Thank you!   Future Appointments  Date Time Provider Department Center  06/12/2023  9:00 AM [facility] VAN [facility] None  06/12/2023  9:30 AM [facility] GYN ONC [facility] None  06/14/2023 10:00 AM [facility] None  06/14/2023 11:00 AM [facility]  06/17/2023 12:30 PM [facility] 1 [facility]  06/18/2023  2:00 PM [facility] VASC 1 [facility]G None  06/18/2023  3:15 PM Dew, Donald Frost, MD [facility] None  06/26/2023  1:00 PM Valli Gaw, MD [facility]   07/02/2023  1:30 PM [facility] PHARMACIST [facility]  07/03/2023  8:00 AM [facility] None  07/03/2023  8:30 AM [facility] FLUSH [facility] None  07/03/2023  9:00 AM Shellie Dials, MD [facility] None  07/03/2023  9:45 AM [facility] INFUSION CHAIR 2 [facility] None  07/10/2023 12:45 PM [facility] None  07/10/2023  1:30 PM [facility] GYN ONC [facility] None  07/11/2023 11:00 AM Vergia Glasgow, MD [facility] None    Daron Ellen, PharmD Clinical Pharmacist [facility] [phone]

## 2023-06-11 ENCOUNTER — Inpatient Hospital Stay: Payer: PPO

## 2023-06-11 NOTE — Progress Notes (Signed)
Pt spoke to [place] on 06/10/2023

## 2023-06-11 NOTE — Telephone Encounter (Signed)
Noted

## 2023-06-12 ENCOUNTER — Inpatient Hospital Stay: Payer: PPO

## 2023-06-12 ENCOUNTER — Ambulatory Visit: Payer: PPO

## 2023-06-14 ENCOUNTER — Inpatient Hospital Stay: Payer: PPO

## 2023-06-14 ENCOUNTER — Encounter
Admission: RE | Admit: 2023-06-14 | Discharge: 2023-06-14 | Disposition: A | Discharge status: Home / Self Care | Payer: PPO | Source: Ambulatory Visit | Attending: Oncology | Admitting: Oncology

## 2023-06-14 DIAGNOSIS — C541 Malignant neoplasm of endometrium: Secondary | ICD-10-CM | POA: Insufficient documentation

## 2023-06-14 LAB — GLUCOSE, CAPILLARY: Glucose-Capillary: 106 mg/dL — ABNORMAL HIGH (ref 70–99)

## 2023-06-14 MED ORDER — FLUDEOXYGLUCOSE F - 18 (FDG) INJECTION
9.4700 | Freq: Once | INTRAVENOUS | Status: AC | PRN
  Administered 2023-06-14: 9.47 via INTRAVENOUS

## 2023-06-17 ENCOUNTER — Telehealth: Payer: Self-pay

## 2023-06-17 ENCOUNTER — Telehealth: Payer: Self-pay | Admitting: Family Medicine

## 2023-06-17 ENCOUNTER — Ambulatory Visit
Admission: RE | Admit: 2023-06-17 | Discharge: 2023-06-17 | Disposition: A | Discharge status: Home / Self Care | Payer: PPO | Source: Ambulatory Visit | Attending: Oncology | Admitting: Oncology

## 2023-06-17 ENCOUNTER — Inpatient Hospital Stay: Payer: PPO

## 2023-06-17 DIAGNOSIS — I10 Essential (primary) hypertension: Secondary | ICD-10-CM | POA: Diagnosis not present

## 2023-06-17 DIAGNOSIS — C541 Malignant neoplasm of endometrium: Secondary | ICD-10-CM | POA: Insufficient documentation

## 2023-06-17 DIAGNOSIS — I350 Nonrheumatic aortic (valve) stenosis: Secondary | ICD-10-CM | POA: Insufficient documentation

## 2023-06-17 DIAGNOSIS — I700 Atherosclerosis of aorta: Secondary | ICD-10-CM | POA: Insufficient documentation

## 2023-06-17 MED ORDER — TECHNETIUM TC 99M-LABELED RED BLOOD CELLS IV KIT
21.2700 | PACK | Freq: Once | INTRAVENOUS | Status: AC | PRN
Start: 2023-06-17 — End: 2023-06-17
  Administered 2023-06-17: 21.27 via INTRAVENOUS

## 2023-06-17 NOTE — Telephone Encounter (Signed)
Copied from CRM [id number]. Topic: Medicare AWV >> Jun 17, 2023 11:34 AM Payton Doughty wrote: Reason for CRM: Called LVM 06/17/2023 to schedule AWV. Please schedule office or virtual visits. In a appointment  Verlee Rossetti; Care Guide Ambulatory Clinical Support [place] [facility] Direct Dial: [phone]
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
## 2023-06-18 ENCOUNTER — Ambulatory Visit (INDEPENDENT_AMBULATORY_CARE_PROVIDER_SITE_OTHER): Payer: PPO | Admitting: Vascular Surgery

## 2023-06-18 ENCOUNTER — Encounter (INDEPENDENT_AMBULATORY_CARE_PROVIDER_SITE_OTHER): Payer: Self-pay | Admitting: Vascular Surgery

## 2023-06-18 ENCOUNTER — Ambulatory Visit (INDEPENDENT_AMBULATORY_CARE_PROVIDER_SITE_OTHER): Payer: PPO

## 2023-06-18 ENCOUNTER — Other Ambulatory Visit: Payer: Self-pay

## 2023-06-18 VITALS — BP 157/70 | HR 63 | Resp 18 | Ht 62.0 in | Wt 174.0 lb

## 2023-06-18 DIAGNOSIS — I10 Essential (primary) hypertension: Secondary | ICD-10-CM | POA: Diagnosis not present

## 2023-06-18 DIAGNOSIS — E782 Mixed hyperlipidemia: Secondary | ICD-10-CM

## 2023-06-18 DIAGNOSIS — E118 Type 2 diabetes mellitus with unspecified complications: Secondary | ICD-10-CM | POA: Diagnosis not present

## 2023-06-18 DIAGNOSIS — Z794 Long term (current) use of insulin: Secondary | ICD-10-CM

## 2023-06-18 DIAGNOSIS — I70213 Atherosclerosis of native arteries of extremities with intermittent claudication, bilateral legs: Secondary | ICD-10-CM

## 2023-06-18 NOTE — Progress Notes (Signed)
MRN : [id number]  Terri [name] is a 73 y.o. (January 01, 1951) female who presents with chief complaint of  Chief Complaint  Patient presents with   Follow-up    fu 3 months + ABI  .  History of Present Illness: Patient returns today in follow up of her PAD.  She is about 4 months status post bilateral iliac intervention and left SFA intervention with good results.  This had a marked improvement in her lower extremity symptoms and at current, she does not have lifestyle limiting claudication, ischemic rest pain, or ulceration. ABIs today are 1.02 on the right and 1.09 on the left.  She has triphasic waveforms on each side and normal digital pressures.    Current Outpatient Medications  Medication Sig Dispense Refill   albuterol (VENTOLIN HFA) 108 (90 Base) MCG/ACT inhaler Inhale 2 puffs into the lungs every 6 (six) hours as needed for wheezing or shortness of breath. 1 each 1   amLODipine (NORVASC) 10 MG tablet Take 10 mg by mouth daily.     aspirin EC 81 MG tablet Take 1 tablet (81 mg total) by mouth daily. Swallow whole. 150 tablet 2   atorvastatin (LIPITOR) 80 MG tablet Take 80 mg by mouth daily.     buPROPion (WELLBUTRIN XL) 300 MG 24 hr tablet TAKE 1 TABLET BY MOUTH DAILY 90 tablet 3   Cholecalciferol (VITAMIN D3) 50 MCG (2000 UT) capsule Take 2,000 Units by mouth daily.     citalopram (CELEXA) 20 MG tablet Take 20 mg by mouth daily.     cloNIDine (CATAPRES) 0.2 MG tablet Take 0.2 mg by mouth 2 (two) times daily.     clopidogrel (PLAVIX) 75 MG tablet Take 1 tablet (75 mg total) by mouth daily. 90 tablet 3   cyanocobalamin (VITAMIN B12) 1000 MCG tablet Take 1 tablet (1,000 mcg total) by mouth daily. 90 tablet 3   fluticasone-salmeterol (WIXELA INHUB) 250-50 MCG/ACT AEPB Inhale 1 puff into the lungs in the morning and at bedtime. 180 each 6   glipiZIDE (GLUCOTROL XL) 10 MG 24 hr tablet Take 1 tablet (10 mg total) by mouth daily. Pt dropped bottle and is aware that insurance may not pay yet.  May need cash pricing or coupon. Thank you. 30 tablet 0   insulin aspart (NOVOLOG) 100 UNIT/ML injection Inject 7 Units into the skin 3 (three) times daily before meals.     insulin degludec (TRESIBA FLEXTOUCH) 100 UNIT/ML FlexTouch Pen Inject 40 Units into the skin daily. 40 units once daily - REPLACES Levemir     Iron, Ferrous Sulfate, 325 (65 Fe) MG TABS Take 325 mg by mouth every other day. 90 tablet 3   losartan-hydrochlorothiazide (HYZAAR) 100-25 MG tablet Take 1 tablet by mouth daily.     metoprolol succinate (TOPROL-XL) 50 MG 24 hr tablet Take 1 tablet by mouth daily.     montelukast (SINGULAIR) 10 MG tablet Take 10 mg by mouth at bedtime.     ondansetron (ZOFRAN) 8 MG tablet Take 1 tablet (8 mg total) by mouth every 8 (eight) hours as needed for nausea or vomiting. 30 tablet 0   ONETOUCH VERIO test strip 1 each by Other route 3 (three) times daily. 100 each 10   Semaglutide,0.25 or 0.5MG /DOS, 2 MG/3ML SOPN Inject 0.25 mg into the skin once a week. 3 mL 3   Semaglutide,0.25 or 0.5MG /DOS, 2 MG/3ML SOPN Inject 0.25 mg into the skin once a week.     No current facility-administered medications for  this visit.    Past Medical History:  Diagnosis Date   Adenomatous colon polyp    Aortic stenosis    Arthritis    Asthma    Cancer (HCC)    Claudication (HCC)    COPD (chronic obstructive pulmonary disease) (HCC)    Depression    Diabetes mellitus without complication (HCC)    Esophageal dysphagia    GERD (gastroesophageal reflux disease)    Hyperlipemia    Hypertension    Obesity    Severe obesity (BMI 35.0-35.9 with comorbidity) (HCC)     Past Surgical History:  Procedure Laterality Date   BLADDER SURGERY     COLONOSCOPY WITH PROPOFOL N/A 08/05/2015   Procedure: COLONOSCOPY WITH PROPOFOL;  Surgeon: Elnita Maxwell, MD;  Location: [facility] ENDOSCOPY;  Service: Endoscopy;  Laterality: N/A;   ESOPHAGOGASTRODUODENOSCOPY N/A 01/09/2022   Procedure: ESOPHAGOGASTRODUODENOSCOPY (EGD);   Surgeon: Regis Bill, MD;  Location: [facility] ENDOSCOPY;  Service: Endoscopy;  Laterality: N/A;   FOOT SURGERY Left    FRACTURE SURGERY     IR CV LINE INJECTION  06/22/2022   IR IMAGING GUIDED PORT INSERTION  06/05/2022   IR PORT REPAIR CENTRAL VENOUS ACCESS DEVICE  07/02/2022   LOWER EXTREMITY ANGIOGRAPHY Left 02/04/2023   Procedure: Lower Extremity Angiography;  Surgeon: Annice Needy, MD;  Location: [facility] INVASIVE CV LAB;  Service: Cardiovascular;  Laterality: Left;   ORIF ANKLE FRACTURE Left 03/26/2017   Procedure: OPEN REDUCTION INTERNAL FIXATION (ORIF) ANKLE FRACTURE;  Surgeon: Christena Flake, MD;  Location: [facility] ORS;  Service: Orthopedics;  Laterality: Left;     Social History   Tobacco Use   Smoking status: Former    Current packs/day: 1.00    Average packs/day: 1 pack/day for 40.0 years (40.0 ttl pk-yrs)    Types: Cigarettes   Smokeless tobacco: Never   Tobacco comments:    Quite 2015  Vaping Use   Vaping status: Never Used  Substance Use Topics   Alcohol use: No   Drug use: No      Family History  Problem Relation Age of Onset   CVA Mother    Diabetes Mother    CAD Father    Breast cancer Neg Hx      No Known Allergies     REVIEW OF SYSTEMS (Negative unless checked)   Constitutional: [] Weight loss  [] Fever  [] Chills Cardiac: [] Chest pain   [] Chest pressure   [] Palpitations   [] Shortness of breath when laying flat   [] Shortness of breath at rest   [x] Shortness of breath with exertion. Vascular:  [x] Pain in legs with walking   [] Pain in legs at rest   [] Pain in legs when laying flat   [x] Claudication   [] Pain in feet when walking  [] Pain in feet at rest  [] Pain in feet when laying flat   [] History of DVT   [] Phlebitis   [] Swelling in legs   [] Varicose veins   [] Non-healing ulcers Pulmonary:   [] Uses home oxygen   [] Productive cough   [] Hemoptysis   [] Wheeze  [x] COPD   [] Asthma Neurologic:  [] Dizziness  [] Blackouts   [] Seizures   [] History of stroke   [] History of  TIA  [] Aphasia   [] Temporary blindness   [] Dysphagia   [] Weakness or numbness in arms   [] Weakness or numbness in legs Musculoskeletal:  [x] Arthritis   [] Joint swelling   [] Joint pain   [] Low back pain Hematologic:  [] Easy bruising  [] Easy bleeding   [] Hypercoagulable state   [] Anemic  Gastrointestinal:  [] Blood in stool   [] Vomiting blood  [x] Gastroesophageal reflux/heartburn   [] Abdominal pain Genitourinary:  [] Chronic kidney disease   [] Difficult urination  [] Frequent urination  [] Burning with urination   [] Hematuria Skin:  [] Rashes   [] Ulcers   [] Wounds Psychological:  [x] History of anxiety   []  History of major depression.  Physical Examination  BP (!) 157/70   Pulse 63   Resp 18   Ht 5\' 2"  (1.575 m)   Wt 174 lb (78.9 kg)   BMI 31.83 kg/m  Gen:  WD/WN, NAD Head: [place]/AT, No temporalis wasting. Ear/Nose/Throat: Hearing grossly intact, nares w/o erythema or drainage Eyes: Conjunctiva clear. Sclera non-icteric Neck: Supple.  Trachea midline Pulmonary:  Good air movement, no use of accessory muscles.  Cardiac: RRR, no JVD Vascular:  Vessel Right Left  Radial Palpable Palpable                          PT Palpable Palpable  DP Palpable Palpable   Gastrointestinal: soft, non-tender/non-distended. No guarding/reflex.  Musculoskeletal: M/S 5/5 throughout.  No deformity or atrophy. Trace LE edema. Neurologic: Sensation grossly intact in extremities.  Symmetrical.  Speech is fluent.  Psychiatric: Judgment intact, Mood & affect appropriate for pt's clinical situation. Dermatologic: No rashes or ulcers noted.  No cellulitis or open wounds.      Labs Recent Results (from the past 2160 hours)  CBC     Status: None   Collection Time: 03/29/23  1:24 PM  Result Value Ref Range   WBC 8.6 4.0 - 10.5 K/uL   RBC 4.43 3.87 - 5.11 MIL/uL   Hemoglobin 12.2 12.0 - 15.0 g/dL   HCT 14.7 82.9 - 56.2 %   MCV 84.7 80.0 - 100.0 fL   MCH 27.5 26.0 - 34.0 pg   MCHC 32.5 30.0 - 36.0 g/dL    RDW 13.0 86.5 - 78.4 %   Platelets 199 150 - 400 K/uL   nRBC 0.0 0.0 - 0.2 %    Comment: Performed at [facility], [address] [place], [place] [address]  Comprehensive metabolic panel     Status: Abnormal   Collection Time: 03/29/23  1:24 PM  Result Value Ref Range   Sodium 137 135 - 145 mmol/L   Potassium 3.6 3.5 - 5.1 mmol/L   Chloride 97 (L) 98 - 111 mmol/L   CO2 30 22 - 32 mmol/L   Glucose, Bld 351 (H) 70 - 99 mg/dL    Comment: Glucose reference range applies only to samples taken after fasting for at least 8 hours.   BUN 13 8 - 23 mg/dL   Creatinine, Ser 5.28 0.44 - 1.00 mg/dL   Calcium 8.4 (L) 8.9 - 10.3 mg/dL   Total Protein 6.5 6.5 - 8.1 g/dL   Albumin 3.3 (L) 3.5 - 5.0 g/dL   AST 14 (L) 15 - 41 U/L   ALT 15 0 - 44 U/L   Alkaline Phosphatase 98 38 - 126 U/L   Total Bilirubin 0.6 <1.2 mg/dL   GFR, Estimated >41 >32 mL/min    Comment: (NOTE) Calculated using the CKD-EPI Creatinine Equation (2021)    Anion gap 10 5 - 15    Comment: Performed at [facility], [address] [place], [place] [address]  Troponin I (High Sensitivity)     Status: None   Collection Time: 03/29/23  1:24 PM  Result Value Ref Range   Troponin I (High Sensitivity) 15 <18  ng/L    Comment: (NOTE) Elevated high sensitivity troponin I (hsTnI) values and significant  changes across serial measurements may suggest ACS but many other  chronic and acute conditions are known to elevate hsTnI results.  Refer to the "Links" section for chest pain algorithms and additional  guidance. Performed at [facility], [address] [place], [place] [address]   Brain natriuretic peptide     Status: Abnormal   Collection Time: 03/29/23  1:24 PM  Result Value Ref Range   B Natriuretic Peptide 334.8 (H) 0.0 - 100.0 pg/mL    Comment: Performed at [facility], [address] [place], [place] [address]  Resp panel by RT-PCR (RSV, Flu A&B, Covid) Anterior Nasal Swab      Status: None   Collection Time: 03/29/23  2:51 PM   Specimen: Anterior Nasal Swab  Result Value Ref Range   SARS Coronavirus 2 by RT PCR NEGATIVE NEGATIVE    Comment: (NOTE) SARS-CoV-2 target nucleic acids are NOT DETECTED.  The SARS-CoV-2 RNA is generally detectable in upper respiratory specimens during the acute phase of infection. The lowest concentration of SARS-CoV-2 viral copies this assay can detect is 138 copies/mL. A negative result does not preclude SARS-Cov-2 infection and should not be used as the sole basis for treatment or other patient management decisions. A negative result may occur with  improper specimen collection/handling, submission of specimen other than nasopharyngeal swab, presence of viral mutation(s) within the areas targeted by this assay, and inadequate number of viral copies(<138 copies/mL). A negative result must be combined with clinical observations, patient history, and epidemiological information. The expected result is Negative.  Fact Sheet for Patients:  BloggerCourse.com  Fact Sheet for Healthcare Providers:  SeriousBroker.it  This test is no t yet approved or cleared by the [place] FDA and  has been authorized for detection and/or diagnosis of SARS-CoV-2 by FDA under an Emergency Use Authorization (EUA). This EUA will remain  in effect (meaning this test can be used) for the duration of the COVID-19 declaration under Section 564(b)(1) of the Act, 21 U.S.C.section 360bbb-3(b)(1), unless the authorization is terminated  or revoked sooner.       Influenza A by PCR NEGATIVE NEGATIVE   Influenza B by PCR NEGATIVE NEGATIVE    Comment: (NOTE) The Xpert Xpress SARS-CoV-2/FLU/RSV plus assay is intended as an aid in the diagnosis of influenza from Nasopharyngeal swab specimens and should not be used as a sole basis for treatment. Nasal washings and aspirates are unacceptable for Xpert Xpress  SARS-CoV-2/FLU/RSV testing.  Fact Sheet for Patients: BloggerCourse.com  Fact Sheet for Healthcare Providers: SeriousBroker.it  This test is not yet approved or cleared by the [place] FDA and has been authorized for detection and/or diagnosis of SARS-CoV-2 by FDA under an Emergency Use Authorization (EUA). This EUA will remain in effect (meaning this test can be used) for the duration of the COVID-19 declaration under Section 564(b)(1) of the Act, 21 U.S.C. section 360bbb-3(b)(1), unless the authorization is terminated or revoked.     Resp Syncytial Virus by PCR NEGATIVE NEGATIVE    Comment: (NOTE) Fact Sheet for Patients: BloggerCourse.com  Fact Sheet for Healthcare Providers: SeriousBroker.it  This test is not yet approved or cleared by the [place] FDA and has been authorized for detection and/or diagnosis of SARS-CoV-2 by FDA under an Emergency Use Authorization (EUA). This EUA will remain in effect (meaning this test can be used) for the duration of the COVID-19 declaration under Section  564(b)(1) of the Act, 21 U.S.C. section 360bbb-3(b)(1), unless the authorization is terminated or revoked.  Performed at [facility], [address] [place], [place] [address]   CBG monitoring, ED     Status: Abnormal   Collection Time: 03/29/23  4:45 PM  Result Value Ref Range   Glucose-Capillary 383 (H) 70 - 99 mg/dL    Comment: Glucose reference range applies only to samples taken after fasting for at least 8 hours.  CBG monitoring, ED     Status: Abnormal   Collection Time: 03/29/23  8:28 PM  Result Value Ref Range   Glucose-Capillary 485 (H) 70 - 99 mg/dL    Comment: Glucose reference range applies only to samples taken after fasting for at least 8 hours.  Basic metabolic panel     Status: Abnormal   Collection Time: 03/29/23  8:40 PM  Result Value Ref  Range   Sodium 134 (L) 135 - 145 mmol/L   Potassium 3.1 (L) 3.5 - 5.1 mmol/L   Chloride 95 (L) 98 - 111 mmol/L   CO2 27 22 - 32 mmol/L   Glucose, Bld 449 (H) 70 - 99 mg/dL    Comment: Glucose reference range applies only to samples taken after fasting for at least 8 hours.   BUN 20 8 - 23 mg/dL   Creatinine, Ser 6.04 0.44 - 1.00 mg/dL   Calcium 7.9 (L) 8.9 - 10.3 mg/dL   GFR, Estimated >54 >09 mL/min    Comment: (NOTE) Calculated using the CKD-EPI Creatinine Equation (2021)    Anion gap 12 5 - 15    Comment: Performed at [facility], [address] [place], [place] [address]  CBG monitoring, ED     Status: Abnormal   Collection Time: 03/29/23 11:56 PM  Result Value Ref Range   Glucose-Capillary 443 (H) 70 - 99 mg/dL    Comment: Glucose reference range applies only to samples taken after fasting for at least 8 hours.  CBG monitoring, ED     Status: Abnormal   Collection Time: 03/30/23  8:36 AM  Result Value Ref Range   Glucose-Capillary 233 (H) 70 - 99 mg/dL    Comment: Glucose reference range applies only to samples taken after fasting for at least 8 hours.  CMP (Cancer Center only)     Status: Abnormal   Collection Time: 04/10/23  8:13 AM  Result Value Ref Range   Sodium 141 135 - 145 mmol/L   Potassium 3.8 3.5 - 5.1 mmol/L   Chloride 104 98 - 111 mmol/L   CO2 30 22 - 32 mmol/L   Glucose, Bld 140 (H) 70 - 99 mg/dL    Comment: Glucose reference range applies only to samples taken after fasting for at least 8 hours.   BUN 16 8 - 23 mg/dL   Creatinine 4.78 2.95 - 1.00 mg/dL   Calcium 8.3 (L) 8.9 - 10.3 mg/dL   Total Protein 5.2 (L) 6.5 - 8.1 g/dL   Albumin 3.0 (L) 3.5 - 5.0 g/dL   AST 19 15 - 41 U/L   ALT 17 0 - 44 U/L   Alkaline Phosphatase 66 38 - 126 U/L   Total Bilirubin 0.6 <1.2 mg/dL   GFR, Estimated >62 >13 mL/min    Comment: (NOTE) Calculated using the CKD-EPI Creatinine Equation (2021)    Anion gap 7 5 - 15    Comment: Performed at [facility], [address], [place], [place] [address]  CBC with Differential (  Cancer Center Only)     Status: Abnormal   Collection Time: 04/10/23  8:13 AM  Result Value Ref Range   WBC Count 5.8 4.0 - 10.5 K/uL   RBC 3.89 3.87 - 5.11 MIL/uL   Hemoglobin 10.8 (L) 12.0 - 15.0 g/dL   HCT 02.7 (L) 25.3 - 66.4 %   MCV 85.6 80.0 - 100.0 fL   MCH 27.8 26.0 - 34.0 pg   MCHC 32.4 30.0 - 36.0 g/dL   RDW 40.3 47.4 - 25.9 %   Platelet Count 165 150 - 400 K/uL   nRBC 0.0 0.0 - 0.2 %   Neutrophils Relative % 82 %   Neutro Abs 4.7 1.7 - 7.7 K/uL   Lymphocytes Relative 6 %   Lymphs Abs 0.4 (L) 0.7 - 4.0 K/uL   Monocytes Relative 9 %   Monocytes Absolute 0.5 0.1 - 1.0 K/uL   Eosinophils Relative 3 %   Eosinophils Absolute 0.2 0.0 - 0.5 K/uL   Basophils Relative 0 %   Basophils Absolute 0.0 0.0 - 0.1 K/uL   Immature Granulocytes 0 %   Abs Immature Granulocytes 0.02 0.00 - 0.07 K/uL    Comment: Performed at [facility], [address] [place], [place] [address]  CMP (Cancer Center only)     Status: Abnormal   Collection Time: 04/17/23 10:10 AM  Result Value Ref Range   Sodium 136 135 - 145 mmol/L   Potassium 3.9 3.5 - 5.1 mmol/L   Chloride 101 98 - 111 mmol/L   CO2 28 22 - 32 mmol/L   Glucose, Bld 362 (H) 70 - 99 mg/dL    Comment: Glucose reference range applies only to samples taken after fasting for at least 8 hours.   BUN 21 8 - 23 mg/dL   Creatinine 5.64 3.32 - 1.00 mg/dL   Calcium 8.4 (L) 8.9 - 10.3 mg/dL   Total Protein 5.3 (L) 6.5 - 8.1 g/dL   Albumin 3.0 (L) 3.5 - 5.0 g/dL   AST 15 15 - 41 U/L   ALT 14 0 - 44 U/L   Alkaline Phosphatase 66 38 - 126 U/L   Total Bilirubin 0.5 <1.2 mg/dL   GFR, Estimated >95 >18 mL/min    Comment: (NOTE) Calculated using the CKD-EPI Creatinine Equation (2021)    Anion gap 7 5 - 15    Comment: Performed at [facility], [address] [place], [place] [address]  CBC with Differential (Cancer Center Only)     Status: Abnormal    Collection Time: 04/17/23 10:10 AM  Result Value Ref Range   WBC Count 3.1 (L) 4.0 - 10.5 K/uL   RBC 3.90 3.87 - 5.11 MIL/uL   Hemoglobin 10.7 (L) 12.0 - 15.0 g/dL   HCT 06.3 (L) 01.6 - 01.0 %   MCV 85.1 80.0 - 100.0 fL   MCH 27.4 26.0 - 34.0 pg   MCHC 32.2 30.0 - 36.0 g/dL   RDW 93.2 35.5 - 73.2 %   Platelet Count 130 (L) 150 - 400 K/uL   nRBC 0.0 0.0 - 0.2 %   Neutrophils Relative % 88 %   Neutro Abs 2.7 1.7 - 7.7 K/uL   Lymphocytes Relative 7 %   Lymphs Abs 0.2 (L) 0.7 - 4.0 K/uL   Monocytes Relative 2 %   Monocytes Absolute 0.1 0.1 - 1.0 K/uL   Eosinophils Relative 3 %   Eosinophils Absolute 0.1 0.0 - 0.5 K/uL   Basophils Relative 0 %  Basophils Absolute 0.0 0.0 - 0.1 K/uL   Immature Granulocytes 0 %   Abs Immature Granulocytes 0.01 0.00 - 0.07 K/uL    Comment: Performed at [facility], [address] [place], [place] [address]  CMP (Cancer Center only)     Status: Abnormal   Collection Time: 05/08/23  8:42 AM  Result Value Ref Range   Sodium 138 135 - 145 mmol/L   Potassium 4.0 3.5 - 5.1 mmol/L   Chloride 100 98 - 111 mmol/L   CO2 27 22 - 32 mmol/L   Glucose, Bld 247 (H) 70 - 99 mg/dL    Comment: Glucose reference range applies only to samples taken after fasting for at least 8 hours.   BUN 15 8 - 23 mg/dL   Creatinine 6.04 (H) 5.40 - 1.00 mg/dL   Calcium 8.5 (L) 8.9 - 10.3 mg/dL   Total Protein 5.8 (L) 6.5 - 8.1 g/dL   Albumin 2.8 (L) 3.5 - 5.0 g/dL   AST 15 15 - 41 U/L   ALT 13 0 - 44 U/L   Alkaline Phosphatase 66 38 - 126 U/L   Total Bilirubin 0.6 0.0 - 1.2 mg/dL   GFR, Estimated 57 (L) >60 mL/min    Comment: (NOTE) Calculated using the CKD-EPI Creatinine Equation (2021)    Anion gap 11 5 - 15    Comment: Performed at [facility], [address] [place], [place] [address]  CBC with Differential (Cancer Center Only)     Status: Abnormal   Collection Time: 05/08/23  8:42 AM  Result Value Ref Range   WBC Count 8.6 4.0 - 10.5 K/uL   RBC 3.23  (L) 3.87 - 5.11 MIL/uL   Hemoglobin 8.6 (L) 12.0 - 15.0 g/dL   HCT 14.7 (L) 82.9 - 56.2 %   MCV 84.5 80.0 - 100.0 fL   MCH 26.6 26.0 - 34.0 pg   MCHC 31.5 30.0 - 36.0 g/dL   RDW 13.0 (H) 86.5 - 78.4 %   Platelet Count 256 150 - 400 K/uL   nRBC 0.0 0.0 - 0.2 %   Neutrophils Relative % 83 %   Neutro Abs 7.2 1.7 - 7.7 K/uL   Lymphocytes Relative 3 %   Lymphs Abs 0.2 (L) 0.7 - 4.0 K/uL   Monocytes Relative 11 %   Monocytes Absolute 0.9 0.1 - 1.0 K/uL   Eosinophils Relative 0 %   Eosinophils Absolute 0.0 0.0 - 0.5 K/uL   Basophils Relative 1 %   Basophils Absolute 0.1 0.0 - 0.1 K/uL   Immature Granulocytes 2 %   Abs Immature Granulocytes 0.13 (H) 0.00 - 0.07 K/uL    Comment: Performed at [facility], [address] [place], [place] [address]  CBC with Differential (Cancer Center Only)     Status: Abnormal   Collection Time: 06/05/23  8:42 AM  Result Value Ref Range   WBC Count 5.0 4.0 - 10.5 K/uL   RBC 3.37 (L) 3.87 - 5.11 MIL/uL   Hemoglobin 9.2 (L) 12.0 - 15.0 g/dL   HCT 52.8 (L) 41.3 - 24.4 %   MCV 87.2 80.0 - 100.0 fL   MCH 27.3 26.0 - 34.0 pg   MCHC 31.3 30.0 - 36.0 g/dL   RDW 01.0 (H) 27.2 - 53.6 %   Platelet Count 243 150 - 400 K/uL   nRBC 0.0 0.0 - 0.2 %   Neutrophils Relative % 74 %   Neutro Abs 3.6 1.7 - 7.7 K/uL  Lymphocytes Relative 5 %   Lymphs Abs 0.3 (L) 0.7 - 4.0 K/uL   Monocytes Relative 20 %   Monocytes Absolute 1.0 0.1 - 1.0 K/uL   Eosinophils Relative 0 %   Eosinophils Absolute 0.0 0.0 - 0.5 K/uL   Basophils Relative 1 %   Basophils Absolute 0.0 0.0 - 0.1 K/uL   Immature Granulocytes 0 %   Abs Immature Granulocytes 0.02 0.00 - 0.07 K/uL    Comment: Performed at [facility], [address] [place], [place] [address]  CMP (Cancer Center only)     Status: Abnormal   Collection Time: 06/05/23  8:42 AM  Result Value Ref Range   Sodium 141 135 - 145 mmol/L   Potassium 3.3 (L) 3.5 - 5.1 mmol/L   Chloride 104 98 - 111 mmol/L   CO2 28 22  - 32 mmol/L   Glucose, Bld 106 (H) 70 - 99 mg/dL    Comment: Glucose reference range applies only to samples taken after fasting for at least 8 hours.   BUN 16 8 - 23 mg/dL   Creatinine 9.14 (H) 7.82 - 1.00 mg/dL   Calcium 8.7 (L) 8.9 - 10.3 mg/dL   Total Protein 5.7 (L) 6.5 - 8.1 g/dL   Albumin 3.2 (L) 3.5 - 5.0 g/dL   AST 16 15 - 41 U/L   ALT 10 0 - 44 U/L   Alkaline Phosphatase 59 38 - 126 U/L   Total Bilirubin 0.6 0.0 - 1.2 mg/dL   GFR, Estimated 50 (L) >60 mL/min    Comment: (NOTE) Calculated using the CKD-EPI Creatinine Equation (2021)    Anion gap 9 5 - 15    Comment: Performed at [facility], [address] [place], [place] [address]  Hold Tube- Blood Bank     Status: None   Collection Time: 06/05/23  8:42 AM  Result Value Ref Range   Blood Bank Specimen SAMPLE AVAILABLE FOR TESTING    Sample Expiration      06/08/2023,2359 Performed at [facility], [address] [place], [place] [address]   Glucose, capillary     Status: Abnormal   Collection Time: 06/14/23 11:19 AM  Result Value Ref Range   Glucose-Capillary 106 (H) 70 - 99 mg/dL    Comment: Glucose reference range applies only to samples taken after fasting for at least 8 hours.    Radiology NM Cardiac Muga Rest Result Date: 06/18/2023 CLINICAL DATA:  Evaluate cardiac function. Patient undergoing chemotherapy for endometrial cancer. EXAM: NUCLEAR MEDICINE CARDIAC BLOOD POOL IMAGING (MUGA) TECHNIQUE: Cardiac multi-gated acquisition was performed at rest following intravenous injection of Tc-32m labeled red blood cells. RADIOPHARMACEUTICALS:  21.27 mCi Tc-45m pertechnetate in-vitro labeled red blood cells IV COMPARISON:  Prior study 04/09/2023 FINDINGS: Normal left ventricular wall motion. No akinetic or dyskinetic segments. The left ventricular ejection fraction is calculated at 70.8%. This was 66.3% on the prior study. Again noted is diffuse uptake in the spleen. IMPRESSION: Left ventricular  ejection fraction 70.8%. Electronically Signed   By: Rudie Meyer M.D.   On: 06/18/2023 10:17   CT Head Wo Contrast Result Date: 06/05/2023 CLINICAL DATA:  Fall, head and neck injury EXAM: CT HEAD WITHOUT CONTRAST CT CERVICAL SPINE WITHOUT CONTRAST TECHNIQUE: Multidetector CT imaging of the head and cervical spine was performed following the standard protocol without intravenous contrast. Multiplanar CT image reconstructions of the cervical spine were also generated. RADIATION DOSE REDUCTION: This exam was performed according to the departmental dose-optimization program which  includes automated exposure control, adjustment of the mA and/or kV according to patient size and/or use of iterative reconstruction technique. COMPARISON:  12/19/2022 FINDINGS: CT HEAD FINDINGS Brain: No evidence of acute infarction, hemorrhage, hydrocephalus, extra-axial collection or mass lesion/mass effect. Periventricular white matter hypodensity. Vascular: No hyperdense vessel or unexpected calcification. Skull: Normal. Negative for fracture or focal lesion. Sinuses/Orbits: No acute finding. Other: Soft tissue contusion and hematoma of the right forehead (series 2, image 16). CT CERVICAL SPINE FINDINGS Alignment: Normal. Skull base and vertebrae: No acute fracture. No primary bone lesion or focal pathologic process. Soft tissues and spinal canal: No prevertebral fluid or swelling. No visible canal hematoma. Disc levels: Moderate multilevel cervical disc degenerative disease. Upper chest: Negative. Other: Partially callused, subacute fracture of the posterior right third rib (series 6, image 87). IMPRESSION: 1. No acute intracranial pathology. Small-vessel white matter disease. 2. Soft tissue contusion and hematoma of the right forehead. 3. No fracture or static subluxation of the cervical spine. 4. Moderate multilevel cervical disc degenerative disease. Electronically Signed   By: Jearld Lesch M.D.   On: 06/05/2023 14:10   CT  Cervical Spine Wo Contrast Result Date: 06/05/2023 CLINICAL DATA:  Fall, head and neck injury EXAM: CT HEAD WITHOUT CONTRAST CT CERVICAL SPINE WITHOUT CONTRAST TECHNIQUE: Multidetector CT imaging of the head and cervical spine was performed following the standard protocol without intravenous contrast. Multiplanar CT image reconstructions of the cervical spine were also generated. RADIATION DOSE REDUCTION: This exam was performed according to the departmental dose-optimization program which includes automated exposure control, adjustment of the mA and/or kV according to patient size and/or use of iterative reconstruction technique. COMPARISON:  12/19/2022 FINDINGS: CT HEAD FINDINGS Brain: No evidence of acute infarction, hemorrhage, hydrocephalus, extra-axial collection or mass lesion/mass effect. Periventricular white matter hypodensity. Vascular: No hyperdense vessel or unexpected calcification. Skull: Normal. Negative for fracture or focal lesion. Sinuses/Orbits: No acute finding. Other: Soft tissue contusion and hematoma of the right forehead (series 2, image 16). CT CERVICAL SPINE FINDINGS Alignment: Normal. Skull base and vertebrae: No acute fracture. No primary bone lesion or focal pathologic process. Soft tissues and spinal canal: No prevertebral fluid or swelling. No visible canal hematoma. Disc levels: Moderate multilevel cervical disc degenerative disease. Upper chest: Negative. Other: Partially callused, subacute fracture of the posterior right third rib (series 6, image 87). IMPRESSION: 1. No acute intracranial pathology. Small-vessel white matter disease. 2. Soft tissue contusion and hematoma of the right forehead. 3. No fracture or static subluxation of the cervical spine. 4. Moderate multilevel cervical disc degenerative disease. Electronically Signed   By: Jearld Lesch M.D.   On: 06/05/2023 14:10    Assessment/Plan  No problem-specific Assessment & Plan notes found for this  encounter.  Essential hypertension blood pressure control important in reducing the progression of atherosclerotic disease. On appropriate oral medications.     Type 2 diabetes mellitus with complication, with long-term current use of insulin (HCC) blood glucose control important in reducing the progression of atherosclerotic disease. Also, involved in wound healing. On appropriate medications.     Hyperlipemia, mixed lipid control important in reducing the progression of atherosclerotic disease. Continue statin therapy  Festus Barren, MD  06/18/2023 4:45 PM    This note was created with Dragon medical transcription system.  Any errors from dictation are purely unintentional

## 2023-06-18 NOTE — Assessment & Plan Note (Signed)
ABIs today are 1.02 on the right and 1.09 on the left.  She has triphasic waveforms on each side and normal digital pressures.  Perfusion is doing well about 4 months status post bilateral iliac intervention and left SFA intervention.  Continue current medical regimen which includes aspirin, Plavix, and Lipitor.  Recheck in 6 months.

## 2023-06-19 ENCOUNTER — Other Ambulatory Visit: Payer: Self-pay

## 2023-06-19 ENCOUNTER — Inpatient Hospital Stay: Payer: PPO

## 2023-06-20 LAB — VAS US ABI WITH/WO TBI
Left ABI: 1.09
Right ABI: 1.02

## 2023-06-24 ENCOUNTER — Telehealth: Payer: Self-pay | Admitting: *Deleted

## 2023-06-24 ENCOUNTER — Encounter: Payer: Self-pay | Admitting: *Deleted

## 2023-06-24 NOTE — Telephone Encounter (Signed)
 Patient called again and said that she has been having issues for 2 weeks and she has some redness on stomach abd back, and the achy. She wants to speak to finnegan team. I still not able to get touch with her.

## 2023-06-24 NOTE — Telephone Encounter (Signed)
 The pt. Called stating she is on chemo and achy, hurts to sit and also   same to lay down and wants to speak to nurse for finnegan. I have trying calling 6 times and and not getting any response.

## 2023-06-25 ENCOUNTER — Encounter: Payer: Self-pay | Admitting: Hospice and Palliative Medicine

## 2023-06-25 ENCOUNTER — Inpatient Hospital Stay (HOSPITAL_BASED_OUTPATIENT_CLINIC_OR_DEPARTMENT_OTHER): Payer: PPO | Admitting: Hospice and Palliative Medicine

## 2023-06-25 ENCOUNTER — Inpatient Hospital Stay: Payer: PPO

## 2023-06-25 VITALS — BP 144/58 | HR 57 | Temp 96.6°F | Resp 18 | Wt 176.1 lb

## 2023-06-25 DIAGNOSIS — C541 Malignant neoplasm of endometrium: Secondary | ICD-10-CM

## 2023-06-25 DIAGNOSIS — Z5111 Encounter for antineoplastic chemotherapy: Secondary | ICD-10-CM | POA: Diagnosis not present

## 2023-06-25 DIAGNOSIS — R21 Rash and other nonspecific skin eruption: Secondary | ICD-10-CM | POA: Diagnosis not present

## 2023-06-25 MED ORDER — METHYLPREDNISOLONE 4 MG PO TBPK: ORAL_TABLET | ORAL | 0 refills | Status: DC

## 2023-06-25 NOTE — Progress Notes (Signed)
 Patient concerned with painful rash on abdomen and thighs for past 1-2 weeks.

## 2023-06-25 NOTE — Progress Notes (Signed)
 Symptom Management Clinic [facility] at [facility] [phone] [phone]  Patient Care Team: Dana Allan, MD as PCP - General (Family Medicine) Benita Gutter, RN as Oncology Nurse Navigator Orlie Dakin, Tollie Pizza, MD as Consulting Physician (Oncology) Blossom Hoops, MD as Referring Physician (Ophthalmology) [facility]   NAME OF PATIENT: Terri [name]  [id number]  08-Apr-1951   DATE OF VISIT: 06/25/23  REASON FOR CONSULT: Aja Whitehair is a 73 y.o. 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   INTERVAL HISTORY: Patient received cycle 3 Doxil on 06/05/2023.  She presents to [facility] today for evaluation of a rash.  Patient describes a red, painful rash to her abdomen extending down her legs.  She also has red rashes to her arms/chest.  Patient states that rash appeared 2 weeks ago soon after her last cycle of Doxil.  Rash has been persistent without any improvement.  Patient has not currently using any topical treatments.  No changes in drugs or food.  Patient also endorses generalized dryness and itchy skin.  Also has cracks to her fingertips.  Denies any neurologic complaints. Denies recent fevers or illnesses. Denies any easy bleeding or bruising. Reports good appetite and denies weight loss. Denies chest pain. Denies any nausea, vomiting, constipation, or diarrhea. Denies urinary complaints. Patient offers no further specific complaints today.   PAST MEDICAL HISTORY: Past Medical History:  Diagnosis Date   Adenomatous colon polyp    Aortic stenosis    Arthritis    Asthma    Cancer (HCC)    Claudication (HCC)    COPD (chronic obstructive pulmonary disease) (HCC)    Depression    Diabetes mellitus without complication (HCC)    Esophageal dysphagia    GERD (gastroesophageal reflux disease)    Hyperlipemia    Hypertension    Obesity     Severe obesity (BMI 35.0-35.9 with comorbidity) (HCC)     PAST SURGICAL HISTORY:  Past Surgical History:  Procedure Laterality Date   BLADDER SURGERY     COLONOSCOPY WITH PROPOFOL N/A 08/05/2015   Procedure: COLONOSCOPY WITH PROPOFOL;  Surgeon: Elnita Maxwell, MD;  Location: [facility] ENDOSCOPY;  Service: Endoscopy;  Laterality: N/A;   ESOPHAGOGASTRODUODENOSCOPY N/A 01/09/2022   Procedure: ESOPHAGOGASTRODUODENOSCOPY (EGD);  Surgeon: Regis Bill, MD;  Location: [facility] ENDOSCOPY;  Service: Endoscopy;  Laterality: N/A;   FOOT SURGERY Left    FRACTURE SURGERY     IR CV LINE INJECTION  06/22/2022   IR IMAGING GUIDED PORT INSERTION  06/05/2022   IR PORT REPAIR CENTRAL VENOUS ACCESS DEVICE  07/02/2022   LOWER EXTREMITY ANGIOGRAPHY Left 02/04/2023   Procedure: Lower Extremity Angiography;  Surgeon: Annice Needy, MD;  Location: [facility] INVASIVE CV LAB;  Service: Cardiovascular;  Laterality: Left;   ORIF ANKLE FRACTURE Left 03/26/2017   Procedure: OPEN REDUCTION INTERNAL FIXATION (ORIF) ANKLE FRACTURE;  Surgeon: Christena Flake, MD;  Location: [facility] ORS;  Service: Orthopedics;  Laterality: Left;    HEMATOLOGY/ONCOLOGY HISTORY:  Oncology History  Endometrial cancer (HCC)  01/25/2022 Initial Diagnosis   Endometrial cancer (HCC)   05/30/2022 Cancer Staging   Staging form: Corpus Uteri - Carcinoma and Carcinosarcoma, AJCC 8th Edition - Clinical stage from 05/30/2022: FIGO Stage IIIB (cT3b, cN0, cM0) - Signed by Jeralyn Ruths, MD on 05/30/2022 Stage prefix: Initial diagnosis   06/20/2022 - 10/05/2022 Chemotherapy   Patient is on Treatment Plan : Endometrial carboplatin (5) + Paclitaxel (175) +  Pembrolizumab (200) D1 q21d     10/23/2022 - 02/27/2023 Chemotherapy   Patient is on Treatment Plan : Endometrial carcinoma.  Maintenance pembrolizumab (200mg ) q21d     04/10/2023 -  Chemotherapy   Patient is on Treatment Plan : Endometrial liposomal Doxorubicin (50) q28d       ALLERGIES:  has no known  allergies.  MEDICATIONS:  Current Outpatient Medications  Medication Sig Dispense Refill   albuterol (VENTOLIN HFA) 108 (90 Base) MCG/ACT inhaler Inhale 2 puffs into the lungs every 6 (six) hours as needed for wheezing or shortness of breath. 1 each 1   amLODipine (NORVASC) 10 MG tablet Take 10 mg by mouth daily.     aspirin EC 81 MG tablet Take 1 tablet (81 mg total) by mouth daily. Swallow whole. 150 tablet 2   atorvastatin (LIPITOR) 80 MG tablet Take 80 mg by mouth daily.     buPROPion (WELLBUTRIN XL) 300 MG 24 hr tablet TAKE 1 TABLET BY MOUTH DAILY 90 tablet 3   Cholecalciferol (VITAMIN D3) 50 MCG (2000 UT) capsule Take 2,000 Units by mouth daily.     citalopram (CELEXA) 20 MG tablet Take 20 mg by mouth daily.     cloNIDine (CATAPRES) 0.2 MG tablet Take 0.2 mg by mouth 2 (two) times daily.     clopidogrel (PLAVIX) 75 MG tablet Take 1 tablet (75 mg total) by mouth daily. 90 tablet 3   cyanocobalamin (VITAMIN B12) 1000 MCG tablet Take 1 tablet (1,000 mcg total) by mouth daily. 90 tablet 3   fluticasone-salmeterol (WIXELA INHUB) 250-50 MCG/ACT AEPB Inhale 1 puff into the lungs in the morning and at bedtime. 180 each 6   glipiZIDE (GLUCOTROL XL) 10 MG 24 hr tablet Take 1 tablet (10 mg total) by mouth daily. Pt dropped bottle and is aware that insurance may not pay yet. May need cash pricing or coupon. Thank you. 30 tablet 0   insulin aspart (NOVOLOG) 100 UNIT/ML injection Inject 7 Units into the skin 3 (three) times daily before meals.     insulin degludec (TRESIBA FLEXTOUCH) 100 UNIT/ML FlexTouch Pen Inject 40 Units into the skin daily. 40 units once daily - REPLACES Levemir     Iron, Ferrous Sulfate, 325 (65 Fe) MG TABS Take 325 mg by mouth every other day. 90 tablet 3   losartan-hydrochlorothiazide (HYZAAR) 100-25 MG tablet Take 1 tablet by mouth daily.     metoprolol succinate (TOPROL-XL) 50 MG 24 hr tablet Take 1 tablet by mouth daily.     montelukast (SINGULAIR) 10 MG tablet Take 10 mg by  mouth at bedtime.     ondansetron (ZOFRAN) 8 MG tablet Take 1 tablet (8 mg total) by mouth every 8 (eight) hours as needed for nausea or vomiting. 30 tablet 0   ONETOUCH VERIO test strip 1 each by Other route 3 (three) times daily. 100 each 10   Semaglutide,0.25 or 0.5MG /DOS, 2 MG/3ML SOPN Inject 0.25 mg into the skin once a week. 3 mL 3   Semaglutide,0.25 or 0.5MG /DOS, 2 MG/3ML SOPN Inject 0.25 mg into the skin once a week.     No current facility-administered medications for this visit.    VITAL SIGNS: There were no vitals taken for this visit. There were no vitals filed for this visit.  Estimated body mass index is 31.83 kg/m as calculated from the following:   Height as of 06/18/23: 5\' 2"  (1.575 m).   Weight as of 06/18/23: 174 lb (78.9 kg).  LABS: CBC:  Component Value Date/Time   WBC 5.0 06/05/2023 0842   WBC 8.6 03/29/2023 1324   HGB 9.2 (L) 06/05/2023 0842   HCT 29.4 (L) 06/05/2023 0842   PLT 243 06/05/2023 0842   MCV 87.2 06/05/2023 0842   NEUTROABS 3.6 06/05/2023 0842   LYMPHSABS 0.3 (L) 06/05/2023 0842   MONOABS 1.0 06/05/2023 0842   EOSABS 0.0 06/05/2023 0842   BASOSABS 0.0 06/05/2023 0842   Comprehensive Metabolic Panel:    Component Value Date/Time   NA 141 06/05/2023 0842   K 3.3 (L) 06/05/2023 0842   CL 104 06/05/2023 0842   CO2 28 06/05/2023 0842   BUN 16 06/05/2023 0842   CREATININE 1.15 (H) 06/05/2023 0842   GLUCOSE 106 (H) 06/05/2023 0842   CALCIUM 8.7 (L) 06/05/2023 0842   AST 16 06/05/2023 0842   ALT 10 06/05/2023 0842   ALKPHOS 59 06/05/2023 0842   BILITOT 0.6 06/05/2023 0842   PROT 5.7 (L) 06/05/2023 0842   ALBUMIN 3.2 (L) 06/05/2023 0842    RADIOGRAPHIC STUDIES: VAS [place] ABI WITH/WO TBI Result Date: 06/20/2023  LOWER EXTREMITY DOPPLER STUDY Patient Name:  GINI CAPUTO  Date of Exam:   06/18/2023 Medical Rec #: [id number]    Accession #:    [id number] Date of Birth: [birth year]    Patient Gender: F Patient Age:   71 years Exam Location:  [place]  [facility] Procedure:      VAS [place] ABI WITH/WO TBI Referring Phys: [name] DEW --------------------------------------------------------------------------------  Indications: Claudication, and peripheral artery disease. High Risk Factors: Hypertension, hyperlipidemia, past history of smoking. Other Factors: Over 10 years bilateral calf claudication.   Vascular Interventions: 02/24/2023: Aortogram and Selective Left Lower Extremity                         Angiogram. PTA of the Left SFA with 5 mm diameter by 15                         cm Length Lutonix drug coated angioplasty balloon. Stent                         placement to the Left SFA with 6 mm diameter by 12 cm                         Length Life stent. PTA of the Left EIA and Proximal CFA                         with 6 mm diameter by 12 cm length Lutonix drug coated                         angioplasty. Stent placement to the Right CIA with 9 mm                         diameter by 38 mm length Lifestream Stent. Performing Technologist: Hardie Lora RVT  Examination Guidelines: A complete evaluation includes at minimum, Doppler waveform signals and systolic blood pressure reading at the level of bilateral brachial, anterior tibial, and posterior tibial arteries, when vessel segments are accessible. Bilateral testing is considered an integral part of a complete examination. Photoelectric Plethysmograph (PPG) waveforms and toe systolic pressure readings are included as required and additional duplex testing as needed. Limited  examinations for reoccurring indications may be performed as noted.  ABI Findings: +---------+------------------+-----+---------+--------+ Right    Rt Pressure (mmHg)IndexWaveform Comment  +---------+------------------+-----+---------+--------+ Brachial 160                                      +---------+------------------+-----+---------+--------+ PTA      150               0.94 triphasic          +---------+------------------+-----+---------+--------+ DP       164               1.02 triphasic         +---------+------------------+-----+---------+--------+ Great Toe122               0.76                   +---------+------------------+-----+---------+--------+ +---------+------------------+-----+---------+-------+ Left     Lt Pressure (mmHg)IndexWaveform Comment +---------+------------------+-----+---------+-------+ Brachial 154                                     +---------+------------------+-----+---------+-------+ PTA      175               1.09 triphasic        +---------+------------------+-----+---------+-------+ DP       183               1.14 triphasic        +---------+------------------+-----+---------+-------+ Randie Heinz Toe134               0.84                  +---------+------------------+-----+---------+-------+ +-------+-----------+-----------+------------+------------+ ABI/TBIToday's ABIToday's TBIPrevious ABIPrevious TBI +-------+-----------+-----------+------------+------------+ Right  1.02       0.76       0.87        1.00         +-------+-----------+-----------+------------+------------+ Left   1.09       0.84       0.94        0.82         +-------+-----------+-----------+------------+------------+ Right ABIs appear increased compared to prior study on 03/04/2023. Left ABIs appear essentially unchanged compared to prior study on 03/04/2023.  Summary: Right: Resting right ankle-brachial index is within normal range. The right toe-brachial index is normal. Left: Resting left ankle-brachial index is within normal range. The left toe-brachial index is normal. *See table(s) above for measurements and observations.  Electronically signed by Festus Barren MD on 06/20/2023 at 10:02:56 AM.    Final    NM Cardiac Muga Rest Result Date: 06/18/2023 CLINICAL DATA:  Evaluate cardiac function. Patient undergoing chemotherapy for endometrial  cancer. EXAM: NUCLEAR MEDICINE CARDIAC BLOOD POOL IMAGING (MUGA) TECHNIQUE: Cardiac multi-gated acquisition was performed at rest following intravenous injection of Tc-75m labeled red blood cells. RADIOPHARMACEUTICALS:  21.27 mCi Tc-88m pertechnetate in-vitro labeled red blood cells IV COMPARISON:  Prior study 04/09/2023 FINDINGS: Normal left ventricular wall motion. No akinetic or dyskinetic segments. The left ventricular ejection fraction is calculated at 70.8%. This was 66.3% on the prior study. Again noted is diffuse uptake in the spleen. IMPRESSION: Left ventricular ejection fraction 70.8%. Electronically Signed   By: Rudie Meyer M.D.   On: 06/18/2023 10:17   CT Head Wo Contrast Result Date: 06/05/2023 CLINICAL DATA:  Fall, head and  neck injury EXAM: CT HEAD WITHOUT CONTRAST CT CERVICAL SPINE WITHOUT CONTRAST TECHNIQUE: Multidetector CT imaging of the head and cervical spine was performed following the standard protocol without intravenous contrast. Multiplanar CT image reconstructions of the cervical spine were also generated. RADIATION DOSE REDUCTION: This exam was performed according to the departmental dose-optimization program which includes automated exposure control, adjustment of the mA and/or kV according to patient size and/or use of iterative reconstruction technique. COMPARISON:  12/19/2022 FINDINGS: CT HEAD FINDINGS Brain: No evidence of acute infarction, hemorrhage, hydrocephalus, extra-axial collection or mass lesion/mass effect. Periventricular white matter hypodensity. Vascular: No hyperdense vessel or unexpected calcification. Skull: Normal. Negative for fracture or focal lesion. Sinuses/Orbits: No acute finding. Other: Soft tissue contusion and hematoma of the right forehead (series 2, image 16). CT CERVICAL SPINE FINDINGS Alignment: Normal. Skull base and vertebrae: No acute fracture. No primary bone lesion or focal pathologic process. Soft tissues and spinal canal: No prevertebral fluid  or swelling. No visible canal hematoma. Disc levels: Moderate multilevel cervical disc degenerative disease. Upper chest: Negative. Other: Partially callused, subacute fracture of the posterior right third rib (series 6, image 87). IMPRESSION: 1. No acute intracranial pathology. Small-vessel white matter disease. 2. Soft tissue contusion and hematoma of the right forehead. 3. No fracture or static subluxation of the cervical spine. 4. Moderate multilevel cervical disc degenerative disease. Electronically Signed   By: Jearld Lesch M.D.   On: 06/05/2023 14:10   CT Cervical Spine Wo Contrast Result Date: 06/05/2023 CLINICAL DATA:  Fall, head and neck injury EXAM: CT HEAD WITHOUT CONTRAST CT CERVICAL SPINE WITHOUT CONTRAST TECHNIQUE: Multidetector CT imaging of the head and cervical spine was performed following the standard protocol without intravenous contrast. Multiplanar CT image reconstructions of the cervical spine were also generated. RADIATION DOSE REDUCTION: This exam was performed according to the departmental dose-optimization program which includes automated exposure control, adjustment of the mA and/or kV according to patient size and/or use of iterative reconstruction technique. COMPARISON:  12/19/2022 FINDINGS: CT HEAD FINDINGS Brain: No evidence of acute infarction, hemorrhage, hydrocephalus, extra-axial collection or mass lesion/mass effect. Periventricular white matter hypodensity. Vascular: No hyperdense vessel or unexpected calcification. Skull: Normal. Negative for fracture or focal lesion. Sinuses/Orbits: No acute finding. Other: Soft tissue contusion and hematoma of the right forehead (series 2, image 16). CT CERVICAL SPINE FINDINGS Alignment: Normal. Skull base and vertebrae: No acute fracture. No primary bone lesion or focal pathologic process. Soft tissues and spinal canal: No prevertebral fluid or swelling. No visible canal hematoma. Disc levels: Moderate multilevel cervical disc  degenerative disease. Upper chest: Negative. Other: Partially callused, subacute fracture of the posterior right third rib (series 6, image 87). IMPRESSION: 1. No acute intracranial pathology. Small-vessel white matter disease. 2. Soft tissue contusion and hematoma of the right forehead. 3. No fracture or static subluxation of the cervical spine. 4. Moderate multilevel cervical disc degenerative disease. Electronically Signed   By: Jearld Lesch M.D.   On: 06/05/2023 14:10    PERFORMANCE STATUS (ECOG) : 1 - Symptomatic but completely ambulatory  Review of Systems Unless otherwise noted, a complete review of systems is negative.  Physical Exam General: NAD Cardiovascular: regular rate and rhythm Pulmonary: clear ant fields Abdomen: soft, nontender, + bowel sounds GU: no suprapubic tenderness Extremities: no edema, no joint deformities Skin: Dry skin, confluent erythematous rash to abdomen Neurological: Weakness but otherwise nonfocal    IMPRESSION/PLAN: Endometrial cancer -on Doxil  Rash -timing of rash is suspicious for drug-induced.  Likely Doxil,  although this more typically affects palms and feet.  Discussed with Dr. Orlie Dakin and will start patient on Medrol Dosepak.  Discussed with PCP who sees patient tomorrow.  Would anticipate potential hyperglycemia.  Patient able to monitor CBG at home and will follow-up with PCP if needed to adjust her diabetic regimen.  If no improvement in rash, would consider dermatology referral.  Dry skin -recommended application of emollient moisturizing lotion several times daily and after showers  Patient to follow-up with PCP tomorrow and see Dr. Orlie Dakin next week    Patient expressed understanding and was in agreement with this plan. She also understands that She can call clinic at any time with any questions, concerns, or complaints.   Thank you for allowing me to participate in the care of this very pleasant patient.   Time Total: 15  minutes  Visit consisted of counseling and education dealing with the complex and emotionally intense issues of symptom management in the setting of serious illness.Greater than 50%  of this time was spent counseling and coordinating care related to the above assessment and plan.  Signed by: Laurette Schimke, PhD, NP-C

## 2023-06-26 ENCOUNTER — Encounter: Payer: Self-pay | Admitting: Family Medicine

## 2023-06-26 ENCOUNTER — Ambulatory Visit: Payer: PPO | Admitting: Family Medicine

## 2023-06-26 VITALS — BP 136/60 | HR 72 | Temp 98.0°F | Resp 18 | Ht 62.0 in | Wt 174.0 lb

## 2023-06-26 DIAGNOSIS — E118 Type 2 diabetes mellitus with unspecified complications: Secondary | ICD-10-CM

## 2023-06-26 DIAGNOSIS — I152 Hypertension secondary to endocrine disorders: Secondary | ICD-10-CM

## 2023-06-26 DIAGNOSIS — J432 Centrilobular emphysema: Secondary | ICD-10-CM

## 2023-06-26 DIAGNOSIS — I70213 Atherosclerosis of native arteries of extremities with intermittent claudication, bilateral legs: Secondary | ICD-10-CM | POA: Diagnosis not present

## 2023-06-26 DIAGNOSIS — E1159 Type 2 diabetes mellitus with other circulatory complications: Secondary | ICD-10-CM

## 2023-06-26 DIAGNOSIS — R21 Rash and other nonspecific skin eruption: Secondary | ICD-10-CM | POA: Diagnosis not present

## 2023-06-26 DIAGNOSIS — S29011A Strain of muscle and tendon of front wall of thorax, initial encounter: Secondary | ICD-10-CM

## 2023-06-26 DIAGNOSIS — D509 Iron deficiency anemia, unspecified: Secondary | ICD-10-CM

## 2023-06-26 DIAGNOSIS — Z7984 Long term (current) use of oral hypoglycemic drugs: Secondary | ICD-10-CM

## 2023-06-26 LAB — POCT GLYCOSYLATED HEMOGLOBIN (HGB A1C): Hemoglobin A1C: 5.7 % — AB (ref 4.0–5.6)

## 2023-06-26 MED ORDER — SEMAGLUTIDE(0.25 OR 0.5MG/DOS) 2 MG/3ML ~~LOC~~ SOPN: 0.5000 mg | PEN_INJECTOR | SUBCUTANEOUS | 3 refills | Status: DC

## 2023-06-26 MED ORDER — SEMAGLUTIDE(0.25 OR 0.5MG/DOS) 2 MG/3ML ~~LOC~~ SOPN
0.5000 mg | PEN_INJECTOR | SUBCUTANEOUS | Status: DC
Start: 2023-06-26 — End: 2023-07-02

## 2023-06-26 MED ORDER — GLIPIZIDE ER 5 MG PO TB24: 5.0000 mg | ORAL_TABLET | Freq: Every day | ORAL | 1 refills | Status: DC

## 2023-06-26 NOTE — Patient Instructions (Addendum)
 It was a pleasure meeting you today. Thank you for allowing me to take part in your health care.  Our goals for today as we discussed include:  A1c 5.7 Decrease Tresiba to 25 units daily Increase Ozempic to 0.5 mg weekly Decrease Glipizide to 5 mg two times a day Continue Insulin Aspart 7 units three times a day with meals  Monitor blood sugars.  If >300 notify MD.  If >400 go to Emergency department If <50 call 911 or have someone take you to the Emergency department  Follow up with Lillia Abed next week.   This is a list of the screening recommended for you and due dates:  Health Maintenance  Topic Date Due   COVID-19 Vaccine (1) Never done   Eye exam for diabetics  Never done   Medicare Annual Wellness Visit  06/15/2023   Zoster (Shingles) Vaccine (1 of 2) 07/27/2023*   Pneumonia Vaccine (2 of 2 - PPSV23 or PCV20) 01/29/2024*   DTaP/Tdap/Td vaccine (1 - Tdap) 04/27/2024*   DEXA scan (bone density measurement)  04/27/2024*   Screening for Lung Cancer  10/08/2023   Hemoglobin A1C  12/24/2023   Yearly kidney health urinalysis for diabetes  01/24/2024   Complete foot exam   02/25/2024   Yearly kidney function blood test for diabetes  06/04/2024   Mammogram  09/26/2024   Colon Cancer Screening  08/04/2025   Flu Shot  Completed   Hepatitis C Screening  Completed   HPV Vaccine  Aged Out  *Topic was postponed. The date shown is not the original due date.     If you have any questions or concerns, please do not hesitate to call the office at [phone].  I look forward to our next visit and until then take care and stay safe.  Regards,   Dana Allan, MD   [facility]

## 2023-06-26 NOTE — Progress Notes (Signed)
 SUBJECTIVE:   Chief Complaint  Patient presents with   Medical Management of Chronic Issues    2 month follow up   HPI Presents to clinic for follow up chronic disease management  Discussed the use of AI scribe software for clinical note transcription with the patient, who gave verbal consent to proceed.  History of Present Illness Terri [name] is a 73 year old female with diabetes and recent chemotherapy who presents with a rash and pain following a fall.  She developed a rash two weeks ago following chemotherapy treatment, which she attributes to the stronger chemotherapy medication she is currently receiving. The rash has been worsening over time.  She experienced a fall from a chemotherapy chair, resulting in a large hematoma on her head. Imaging of her head and spine was performed at the emergency room, and she was discharged the same night. She experiences pain that started after the fall, particularly when lying in bed. No pain upon palpation was noted during the visit.  Her blood sugar levels have fluctuated between 50 and 300, with a recent reading of 163. She is on multiple diabetes medications, including Ozempic, Tresiba, Novolog, and glipizide. She is unsure about the exact dosage of Evaristo Bury and has been taking 25 units instead of the prescribed 40 units due to low blood sugar readings. She does not use a continuous glucose monitoring system and checks her blood sugar manually.  She is currently taking prednisone to help with the pain. She is also on Plavix, a blood thinner, following vascular surgery, but is uncertain if she should continue it as her vascular surgeon did not provide clear instructions. She uses a generic form of Advair for her lungs, sometimes missing the nighttime dose.  She has a history of constipation as a side effect of her chemotherapy, which she manages with over-the-counter laxatives. No fever, chest pain, nausea, vomiting, dizziness, or shortness of  breath. Normal bowel movements and urination.    PERTINENT PMH / PSH: As above  OBJECTIVE:  BP 136/60   Pulse 72   Temp 98 F (36.7 C)   Resp 18   Ht 5\' 2"  (1.575 m)   Wt 174 lb (78.9 kg)   SpO2 97%   BMI 31.83 kg/m    Physical Exam Vitals reviewed.  Constitutional:      General: She is not in acute distress.    Appearance: Normal appearance. She is obese. She is not ill-appearing, toxic-appearing or diaphoretic.  Eyes:     General:        Right eye: No discharge.        Left eye: No discharge.     Conjunctiva/sclera: Conjunctivae normal.  Cardiovascular:     Rate and Rhythm: Normal rate and regular rhythm.     Heart sounds: Normal heart sounds.  Pulmonary:     Effort: Pulmonary effort is normal.     Breath sounds: Normal breath sounds.  Abdominal:     General: Bowel sounds are normal.  Musculoskeletal:        General: Normal range of motion.  Skin:    General: Skin is warm and dry.  Neurological:     General: No focal deficit present.     Mental Status: She is alert and oriented to person, place, and time. Mental status is at baseline.  Psychiatric:        Mood and Affect: Mood normal.        Behavior: Behavior normal.  Thought Content: Thought content normal.        Judgment: Judgment normal.           02/25/2023   11:40 AM 01/24/2023    9:29 AM 05/30/2022    9:29 AM  Depression screen PHQ 2/9  Decreased Interest 0 0 0  Down, Depressed, Hopeless 1 0 0  PHQ - 2 Score 1 0 0  Altered sleeping 1 0   Tired, decreased energy 1 0   Change in appetite 0 0   Feeling bad or failure about yourself  0 0   Trouble concentrating 0 0   Moving slowly or fidgety/restless 0 0   Suicidal thoughts 0 0   PHQ-9 Score 3 0   Difficult doing work/chores Not difficult at all Not difficult at all       01/24/2023    9:29 AM  GAD 7 : Generalized Anxiety Score  Nervous, Anxious, on Edge 0  Control/stop worrying 0  Worry too much - different things 0  Trouble  relaxing 0  Restless 0  Easily annoyed or irritable 0  Afraid - awful might happen 0  Total GAD 7 Score 0  Anxiety Difficulty Not difficult at all    ASSESSMENT/PLAN:  Type 2 diabetes with complication (HCC) Assessment & Plan: Blood glucose levels fluctuating between 50 and 160. Currently on Ozempic, Tresiba, Novolog, and Glipizide. Recent initiation of Prednisone may affect blood glucose levels. -HbA1c today 5.7 -Increase Ozempic to 0.5 mg weekly, sample provided -Decrease Tresiba to 25 units daily -Decrease Glipizide to 5 mg two times a day -Continue Insulin Aspart 7 units three times a day with meals -Follow up with Pharmacy next week  Orders: -     POCT glycosylated hemoglobin (Hb A1C) -     Semaglutide(0.25 or 0.5MG /DOS); Inject 0.5 mg into the skin once a week.  Dispense: 3 mL; Refill: 3 -     glipiZIDE ER; Take 1 tablet (5 mg total) by mouth daily.  Dispense: 30 tablet; Refill: 1 -     Semaglutide(0.25 or 0.5MG /DOS); Inject 0.5 mg into the skin once a week.  Hypertension associated with diabetes [facility]) Assessment & Plan: Well controlled. Patient reports adherence to current antihypertensive medications (Amlodipine, Metoprolol, Hyzaar, Clonidine). -Check blood pressure again before patient leaves. -Continue current medication   Centrilobular emphysema (HCC) Assessment & Plan: On generic Advair for lung health. No worsening symptoms reported. -Continue current respiratory medications as prescribed.   Atherosclerosis of native artery of both lower extremities with intermittent claudication [facility]) Assessment & Plan: On Plavix daily -Follows with Vascular surgery    Rash Assessment & Plan: Developed rash post chemotherapy, likely a drug reaction. Currently on Prednisone for management. -Continue Prednisone as prescribed by Oncology -Check blood glucose levels more frequently while on Prednisone given history of DM. -Notify clinic if blood glucose levels exceed  300. -Notify clinic if blood glucose levels exceed 400 and patient feels unwell, in which case, patient should go to the emergency department.   Muscle strain of chest wall, initial encounter Assessment & Plan: New onset of right side chest tenderness post fall.  No tenderness on examination. Pain exacerbated only when lying in bed. -Continue monitoring. If pain persists or worsens, patient to notify the clinic.   Iron deficiency anemia, unspecified iron deficiency anemia type Assessment & Plan: Had not been taking consistently. -Patient has refills for iron at the pharmacy. -Continue iron supplementation as prescribed.      PDMP reviewed  Return in about  3 months (around 09/23/2023) for PCP.  Dana Allan, MD

## 2023-06-27 ENCOUNTER — Encounter: Payer: Self-pay | Admitting: Oncology

## 2023-07-01 ENCOUNTER — Encounter: Payer: Self-pay | Admitting: Family Medicine

## 2023-07-01 DIAGNOSIS — S29011A Strain of muscle and tendon of front wall of thorax, initial encounter: Secondary | ICD-10-CM | POA: Insufficient documentation

## 2023-07-01 DIAGNOSIS — R21 Rash and other nonspecific skin eruption: Secondary | ICD-10-CM | POA: Insufficient documentation

## 2023-07-01 NOTE — Assessment & Plan Note (Addendum)
 Blood glucose levels fluctuating between 50 and 160. Currently on Ozempic, Tresiba, Novolog, and Glipizide. Recent initiation of Prednisone may affect blood glucose levels. -HbA1c today 5.7 -Increase Ozempic to 0.5 mg weekly, sample provided -Decrease Tresiba to 25 units daily -Decrease Glipizide to 5 mg two times a day -Continue Insulin Aspart 7 units three times a day with meals -Follow up with Pharmacy next week

## 2023-07-01 NOTE — Assessment & Plan Note (Signed)
 On Plavix daily -Follows with Vascular surgery

## 2023-07-01 NOTE — Assessment & Plan Note (Signed)
 On generic Advair for lung health. No worsening symptoms reported. -Continue current respiratory medications as prescribed.

## 2023-07-01 NOTE — Assessment & Plan Note (Signed)
 Had not been taking consistently. -Patient has refills for iron at the pharmacy. -Continue iron supplementation as prescribed.

## 2023-07-01 NOTE — Assessment & Plan Note (Deleted)
 Had not been taking consistently. -Patient has refills for iron at the pharmacy. -Continue iron supplementation as prescribed.

## 2023-07-01 NOTE — Assessment & Plan Note (Signed)
 New onset of right side chest tenderness post fall.  No tenderness on examination. Pain exacerbated only when lying in bed. -Continue monitoring. If pain persists or worsens, patient to notify the clinic.

## 2023-07-01 NOTE — Assessment & Plan Note (Signed)
 Well controlled. Patient reports adherence to current antihypertensive medications (Amlodipine, Metoprolol, Hyzaar, Clonidine). -Check blood pressure again before patient leaves. -Continue current medication

## 2023-07-01 NOTE — Assessment & Plan Note (Addendum)
 Developed rash post chemotherapy, likely a drug reaction. Currently on Prednisone for management. -Continue Prednisone as prescribed by Oncology -Check blood glucose levels more frequently while on Prednisone given history of DM. -Notify clinic if blood glucose levels exceed 300. -Notify clinic if blood glucose levels exceed 400 and patient feels unwell, in which case, patient should go to the emergency department.

## 2023-07-02 ENCOUNTER — Other Ambulatory Visit: Payer: Self-pay | Admitting: Pharmacist

## 2023-07-02 ENCOUNTER — Other Ambulatory Visit (INDEPENDENT_AMBULATORY_CARE_PROVIDER_SITE_OTHER): Payer: PPO | Admitting: Pharmacist

## 2023-07-02 DIAGNOSIS — E1169 Type 2 diabetes mellitus with other specified complication: Secondary | ICD-10-CM

## 2023-07-02 DIAGNOSIS — E118 Type 2 diabetes mellitus with unspecified complications: Secondary | ICD-10-CM

## 2023-07-02 DIAGNOSIS — Z794 Long term (current) use of insulin: Secondary | ICD-10-CM

## 2023-07-02 NOTE — Progress Notes (Signed)
 07/02/2023 Name: Terri [name] MRN: [id number] DOB: August 22, 1950  Subjective  Chief Complaint  Patient presents with   Diabetes    Reason for visit: ?  Terri [name] is a 73 y.o. female with a history of diabetes (type 2), who presents today for a follow up diabetes pharmacotherapy visit.? Pertinent PMH also includes atherosclerosis w intermittent claudication, HTN, COPD, HLD, Endometrial cancer, obesity, .  Known DM Complications:  nephropathy w microalbuminuria; PAD with claudication BLE; HTN    Care Team: Primary Care Provider: Dana Allan, MD Pulmonology Dr. Aundria Rud; Oncology Dr. Orlie Dakin; Cardiology at [facility]   Date of Last Diabetes Related Visit: with PCP on 2/26   Recent Summary of Change: 2/26: Hypo down to 50. A1c 5.7. ?? Tresiba 25 u/d, ?? glipizide 5 mg/d  Medication Access/Adherence: Prescription drug coverage: Payor: [employer] / Plan: [employer] PPO / Product Type: *No Product type* / .  - Reports that all medications are affordable.  - Current Patient Assistance: [employer] - Medication Adherence: Patient denies missing doses of their medication.    Since Last visit / History of Present Illness: ?  Patient reports implementing plan from last visit. Has been doing well on Ozempic 0.5 mg without adverse effects. Denies hypoglycemia in the past week (primarily seeing hyperglycemia w recent steroid course). Reports completing her steroid taper yesterday.  Reported DM Regimen: ?  semaglutide (Ozempic) 0.5 mg once weekly glipizide XL 10 mg once daily (plans to start 5 mg tomorrow) Novolog (aspart) 7 units three times daily Tresiba U100  25  units  daily   DM medications tried in the past:?  Metformin (diarrhea); switched to glipizide  SMBG: Glucometer - Checks BG twice daily via glucometer Date  FBG (mg/dL) 3/4  476 3/3  546 - last dose of steroid 3/2  121    2/29  156   2/28  205 2/27  313 2/26  156 - steroid course  begin 2/25  82 2/24  89  2/23  113  2/21  99   Hypo/Hyperglycemia: ?  Symptoms of hypoglycemia since last visit:? no  If yes, it was treated by: n/a   Reported Diet:  Patient typically eats 3 meals per day.  Breakfast (8:30): Unclear Lunch (12-1pm): Pizza (under 50 carbs); Sandwich; Chicken Engineer, petroleum (7-7:30 pm): Largest meal of the day Snacks: Sweet tooth (states that all sweets are sugar-free).  Exercise: No  DM Prevention:  Statin: Taking; high intensity.?  History of chronic kidney disease? yes History of albuminuria? yes, last UACR on 01/24/23 = 100.4 mg/g ACE/ARB - Taking; Urine MA/CR Ratio - elevated urinary albumin excretion.  Last foot exam: 02/25/2023 Tobacco Use: Former   Cardiovascular Risk Reduction History of clinical ASCVD?  Pad w Claudication The 10-year ASCVD risk score (Arnett DK, et al., 2019) is: 32.9% History of heart failure? no History of hyperlipidemia? yes Current BMI: 31.8 kg/m2 (Ht 62 in, Wt 78.9 kg) Taking statin? yes; high intensity (atorvastaitn 80 mg) Taking aspirin? indicated (secondary prevention);  Taking clopidogrel    Taking SGLT-2i? no Taking GLP- 1 RA? yes     _______________________________________________  Objective    Review of Systems:? Limited in the setting of virtual visit  GI:? No nausea, vomiting, constipation, diarrhea, abdominal pain, dyspepsia, change in bowel habits  Endocrine:? No polyuria, polyphagia or blurred vision    Physical Examination:  Vitals:  Wt Readings from Last 3 Encounters:  06/26/23 174 lb (78.9 kg)  06/25/23 176 lb 1.6 oz (79.9 kg)  06/18/23 174 lb (78.9 kg)   BP Readings from Last 3 Encounters:  06/26/23 136/60  06/25/23 (!) 144/58  06/18/23 (!) 157/70   Pulse Readings from Last 3 Encounters:  06/26/23 72  06/25/23 (!) 57  06/18/23 63   Labs:?  Lab Results  Component Value Date   HGBA1C 5.7 (A) 06/26/2023   HGBA1C 7.2 (H) 12/25/2022   GLUCOSE 106 (H) 06/05/2023    MICRALBCREAT 10.4 01/24/2023   CREATININE 1.15 (H) 06/05/2023   CREATININE 1.04 (H) 05/08/2023   CREATININE 0.95 04/17/2023   GFR 52.91 (L) 01/24/2023   Lab Results  Component Value Date   CHOL 156 01/24/2023   LDLCALC 87 01/24/2023   HDL 46.20 01/24/2023   TRIG 112.0 01/24/2023   ALT 10 06/05/2023   ALT 13 05/08/2023   AST 16 06/05/2023   AST 15 05/08/2023     Chemistry      Component Value Date/Time   NA 141 06/05/2023 0842   K 3.3 (L) 06/05/2023 0842   CL 104 06/05/2023 0842   CO2 28 06/05/2023 0842   BUN 16 06/05/2023 0842   CREATININE 1.15 (H) 06/05/2023 0842      Component Value Date/Time   CALCIUM 8.7 (L) 06/05/2023 0842   ALKPHOS 59 06/05/2023 0842   AST 16 06/05/2023 0842   ALT 10 06/05/2023 0842   BILITOT 0.6 06/05/2023 0842     The 10-year ASCVD risk score (Arnett DK, et al., 2019) is: 32.9%  Assessment and Plan:   1. Diabetes, type 2: controlled per last A1c of 5.7% (06/26/23), decreased from previous 7.2% (12/25/22) with goal <7.5-8% without hypoglycemia given medical complexity and active cancer treatment. BG have been elevated acutely in the setting of steroid course which has been completed as of 06/30/33. Sugars have started normalizing, expect continued improvement over the next week.   Current regimen: Ozempic 0.5 mg/wk, glipizide XL 5 mg daily, Tresiba 25 units daily, Novolog 7 units TIDAC Glucometer data shows hyperglycemia 2/2 steroid use. FBG prior to steroid 100% in range with occasional lows. Continue medications today without changes.  Ozempic 30-Day Free voucher obtained from [employer]  Reviewed s/sx/tx hypoglycemia Plan for brief phone check-in 1 week to ensure sugars s/p steroid are normalizing.  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  Does have hx urinary incontinence, though don't expect significant diuresis with A1c 5.7%. Ideally would allow for d/c glipizide and  reduction in insulin dose(s).  Will likely qualify for free SGLT2 trough PAP  TZD: Avoiding due to possible weight gain/increase in fracture risk. Metformin: Not appropriate w renal function. Hx intolerance   Follow up 1 week phone check in w PharmD to review FBG/insulin Discussion SGLT2i through PAP w goal of discontinuation of glipizide/insulin reduction as able once sugars stabilize.    Future Appointments  Date Time Provider Department Center  07/03/2023  8:00 AM [facility] VAN [facility] None  07/03/2023  8:30 AM [facility] FLUSH [facility] None  07/03/2023  8:45 AM [facility] GYN ONC [facility] None  07/03/2023  9:00 AM Jeralyn Ruths, MD [facility] None  07/03/2023  9:45 AM [facility] INFUSION CHAIR 2 [facility] None  07/04/2023  1:00 PM [facility] None  07/09/2023  2:00 PM [facility] CCM PHARMACIST [facility]  07/11/2023 11:00 AM Dgayli, ZOXWRU, MD [facility] None  07/31/2023  8:30 AM [facility] None  07/31/2023  9:15 AM [facility]PORT FLUSH [facility] None  07/31/2023  9:45 AM Finnegan,  Tollie Pizza, MD [facility] None  07/31/2023 10:15 AM [facility] INFUSION CHAIR 12 [facility] None  09/23/2023  1:00 PM Dana Allan, MD [facility]  12/17/2023  2:00 PM [facility] VASC 2 [facility] None  12/17/2023  3:00 PM Dew, Marlow Baars, MD [facility] None   Loree Fee, PharmD Clinical Pharmacist [facility] [phone]

## 2023-07-02 NOTE — Progress Notes (Signed)
 Patient Assistance Program (PAP)   Manufacturer: [employer]    (Currently enrolled  -  Medication shipment delayed) Medication(s): Ozempic 0.25/0.5 mg  If it has been more than 14 business days order has started processing. Patient is eligible for free 30-day medication voucher.    BIN: [id number] PCN: [id number] GRP: [id number] Voucher ID: [id number] Medication: Ozempic 0.25/.5mg  (1 Pen x 64mL/Pen)  Provided voucher to patient's local pharmacy. Confirms $0 copay.

## 2023-07-02 NOTE — Patient Instructions (Addendum)
 [name],   It was a pleasure to speak with you today! As we discussed:?   Continue all medications as you have been taking them.  Starting tomorrow, decrease your glipizide dose to 5 mg once daily as you mentioned today  I suspect your sugars will start to come down over the next week, back to where they were before your steroid.   I will plan to give you a call in about a week to see how your sugars are doing each morning.  If you are seeing multiple blood sugar readings 70 or below, please reach out.   How to treat low blood sugar:  - For blood sugars less than 70 mg/dL: Treat with 4 ounces of juice or regular soda, or with 3 to 4 glucose tablets.  - Re-check blood sugar in 15 minutes. ?  If blood sugar is still less than 70 on re-check, treat again and re-check in 15 minutes ________________________________________________________  As we discussed, I spoke with Novo Nordisk this morning to request a free 30-day supply of Ozempic for you. You will continue your current dose of 0.5 mg weekly.  Novo Nordisk Patient Assistance [employer] [place]) Medication: Lemont Fillers, NovoFine needles, Ozempic Phone: [phone] M-F 8am-8pm ET     Thank you!  You may respond directly to this message, or leave me a voicemail at [phone] and I will get back to you shortly.    Future Appointments  Date Time Provider Department Center  07/03/2023  8:00 AM [facility] None  07/03/2023  8:30 AM [facility]-PORT FLUSH [facility] None  07/03/2023  8:45 AM [facility] GYN ONC [facility] None  07/03/2023  9:00 AM Jeralyn Ruths, MD [facility] None  07/03/2023  9:45 AM [facility]- [facility] INFUSION CHAIR 2 [facility] None  07/04/2023  1:00 PM [facility] None  07/09/2023  2:00 PM [facility] CCM PHARMACIST [facility]  07/11/2023 11:00 AM Dgayli, GNFAOZ, MD [facility] None  07/31/2023  8:30 AM [facility] None  07/31/2023  9:15 AM [facility]-PORT FLUSH [facility] None  07/31/2023  9:45 AM Orlie Dakin, Tollie Pizza, MD  [facility] None  07/31/2023 10:15 AM [facility]- MO INFUSION CHAIR 12 [facility] None  09/23/2023  1:00 PM Dana Allan, MD [facility]  12/17/2023  2:00 PM [facility] VASC 2 [facility] None  12/17/2023  3:00 PM Dew, Marlow Baars, MD [facility] None    Loree Fee, PharmD Clinical Pharmacist [facility] [phone]

## 2023-07-03 ENCOUNTER — Encounter: Payer: Self-pay | Admitting: Obstetrics and Gynecology

## 2023-07-03 ENCOUNTER — Inpatient Hospital Stay (HOSPITAL_BASED_OUTPATIENT_CLINIC_OR_DEPARTMENT_OTHER): Payer: PPO | Admitting: Oncology

## 2023-07-03 ENCOUNTER — Inpatient Hospital Stay: Payer: PPO

## 2023-07-03 ENCOUNTER — Inpatient Hospital Stay: Payer: PPO | Attending: Obstetrics and Gynecology

## 2023-07-03 ENCOUNTER — Inpatient Hospital Stay: Admitting: Obstetrics and Gynecology

## 2023-07-03 ENCOUNTER — Other Ambulatory Visit: Payer: Self-pay | Admitting: Oncology

## 2023-07-03 VITALS — BP 153/62 | HR 58 | Temp 96.8°F | Resp 19 | Wt 171.0 lb

## 2023-07-03 DIAGNOSIS — R634 Abnormal weight loss: Secondary | ICD-10-CM | POA: Insufficient documentation

## 2023-07-03 DIAGNOSIS — Z5111 Encounter for antineoplastic chemotherapy: Secondary | ICD-10-CM | POA: Diagnosis not present

## 2023-07-03 DIAGNOSIS — R21 Rash and other nonspecific skin eruption: Secondary | ICD-10-CM | POA: Insufficient documentation

## 2023-07-03 DIAGNOSIS — Z7189 Other specified counseling: Secondary | ICD-10-CM

## 2023-07-03 DIAGNOSIS — R630 Anorexia: Secondary | ICD-10-CM | POA: Insufficient documentation

## 2023-07-03 DIAGNOSIS — C5700 Malignant neoplasm of unspecified fallopian tube: Secondary | ICD-10-CM | POA: Insufficient documentation

## 2023-07-03 DIAGNOSIS — C541 Malignant neoplasm of endometrium: Secondary | ICD-10-CM

## 2023-07-03 DIAGNOSIS — N939 Abnormal uterine and vaginal bleeding, unspecified: Secondary | ICD-10-CM | POA: Insufficient documentation

## 2023-07-03 DIAGNOSIS — D649 Anemia, unspecified: Secondary | ICD-10-CM

## 2023-07-03 DIAGNOSIS — C7982 Secondary malignant neoplasm of genital organs: Secondary | ICD-10-CM | POA: Diagnosis not present

## 2023-07-03 DIAGNOSIS — Z923 Personal history of irradiation: Secondary | ICD-10-CM | POA: Insufficient documentation

## 2023-07-03 LAB — CMP (CANCER CENTER ONLY)
ALT: 13 U/L (ref 0–44)
AST: 16 U/L (ref 15–41)
Albumin: 3.2 g/dL — ABNORMAL LOW (ref 3.5–5.0)
Alkaline Phosphatase: 60 U/L (ref 38–126)
Anion gap: 7 (ref 5–15)
BUN: 20 mg/dL (ref 8–23)
CO2: 30 mmol/L (ref 22–32)
Calcium: 8.6 mg/dL — ABNORMAL LOW (ref 8.9–10.3)
Chloride: 100 mmol/L (ref 98–111)
Creatinine: 0.97 mg/dL (ref 0.44–1.00)
GFR, Estimated: >60 mL/min (ref >60)
Glucose, Bld: 81 mg/dL (ref 70–99)
Potassium: 3.6 mmol/L (ref 3.5–5.1)
Sodium: 137 mmol/L (ref 135–145)
Total Bilirubin: 0.7 mg/dL (ref 0.0–1.2)
Total Protein: 5.5 g/dL — ABNORMAL LOW (ref 6.5–8.1)

## 2023-07-03 LAB — CBC WITH DIFFERENTIAL (CANCER CENTER ONLY)
Abs Immature Granulocytes: 0.07 10*3/uL (ref 0.00–0.07)
Basophils Absolute: 0 10*3/uL (ref 0.0–0.1)
Basophils Relative: 1 %
Eosinophils Absolute: 0 10*3/uL (ref 0.0–0.5)
Eosinophils Relative: 0 %
HCT: 30.3 % — ABNORMAL LOW (ref 36.0–46.0)
Hemoglobin: 9.5 g/dL — ABNORMAL LOW (ref 12.0–15.0)
Immature Granulocytes: 2 %
Lymphocytes Relative: 7 %
Lymphs Abs: 0.3 10*3/uL — ABNORMAL LOW (ref 0.7–4.0)
MCH: 27.9 pg (ref 26.0–34.0)
MCHC: 31.4 g/dL (ref 30.0–36.0)
MCV: 88.9 fL (ref 80.0–100.0)
Monocytes Absolute: 0.9 10*3/uL (ref 0.1–1.0)
Monocytes Relative: 19 %
Neutro Abs: 3.3 10*3/uL (ref 1.7–7.7)
Neutrophils Relative %: 71 %
Platelet Count: 258 10*3/uL (ref 150–400)
RBC: 3.41 MIL/uL — ABNORMAL LOW (ref 3.87–5.11)
RDW: 19.6 % — ABNORMAL HIGH (ref 11.5–15.5)
WBC Count: 4.6 10*3/uL (ref 4.0–10.5)
nRBC: 0 % (ref 0.0–0.2)

## 2023-07-03 LAB — SAMPLE TO BLOOD BANK

## 2023-07-03 NOTE — Progress Notes (Signed)
 Of [facility]  [phone] Fax:([phone]  ID: Terri [name] OB: Jan 17, 1951  MR#: [id number]  XBM#:[id number]  Patient Care Team: Dana Allan, MD as PCP - General (Family Medicine) Benita Gutter, RN as Oncology Nurse Navigator Orlie Dakin, Tollie Pizza, MD as Consulting Physician (Oncology) Blossom Hoops, MD as Referring Physician (Ophthalmology) 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  INTERVAL HISTORY: Patient returns to clinic today for further evaluation, discussion of her PET scan results, and treatment planning.  Her rash has significantly improved with steroids.  She continues to have vaginal bleeding.  She denies any pain.  She has no neurologic complaints.  She has had no recent falls.  She denies any recent fevers or illnesses.  She has no chest pain, shortness of breath, cough, or hemoptysis.  She denies any nausea, vomiting, constipation, or diarrhea.  She has no urinary complaints.  Patient offers no further specific complaints today.  REVIEW OF SYSTEMS:   Review of Systems  Constitutional:  Positive for malaise/fatigue. Negative for fever and weight loss.  Respiratory: Negative.  Negative for cough, hemoptysis and shortness of breath.   Cardiovascular: Negative.  Negative for chest pain and leg swelling.  Gastrointestinal: Negative.  Negative for abdominal pain, blood in stool and melena.  Genitourinary: Negative.  Negative for dysuria.  Musculoskeletal: Negative.  Negative for back pain and falls.  Skin:  Positive for rash.  Neurological:  Positive for weakness. Negative for focal weakness and headaches.  Psychiatric/Behavioral: Negative.  The patient is not nervous/anxious.     As per HPI. Otherwise, a complete review of systems is negative.  PAST MEDICAL HISTORY: Past Medical History:  Diagnosis Date   Adenomatous colon polyp    Aortic stenosis     Arthritis    Asthma    Cancer (HCC)    Claudication (HCC)    COPD (chronic obstructive pulmonary disease) (HCC)    Depression    Diabetes mellitus without complication (HCC)    Esophageal dysphagia    GERD (gastroesophageal reflux disease)    Hyperlipemia    Hypertension    Obesity    Severe obesity (BMI 35.0-35.9 with comorbidity) (HCC)     PAST SURGICAL HISTORY: Past Surgical History:  Procedure Laterality Date   BLADDER SURGERY     COLONOSCOPY WITH PROPOFOL N/A 08/05/2015   Procedure: COLONOSCOPY WITH PROPOFOL;  Surgeon: Elnita Maxwell, MD;  Location[facility] ENDOSCOPY;  Service: Endoscopy;  Laterality: N/A;   ESOPHAGOGASTRODUODENOSCOPY N/A 01/09/2022   Procedure: ESOPHAGOGASTRODUODENOSCOPY (EGD);  Surgeon: Regis Bill, MD;  Location: [facility] ENDOSCOPY;  Service: Endoscopy;  Laterality: N/A;   FOOT SURGERY Left    FRACTURE SURGERY     IR CV LINE INJECTION  06/22/2022   IR IMAGING GUIDED PORT INSERTION  06/05/2022   IR PORT REPAIR CENTRAL VENOUS ACCESS DEVICE  07/02/2022   LOWER EXTREMITY ANGIOGRAPHY Left 02/04/2023   Procedure: Lower Extremity Angiography;  Surgeon: Annice Needy, MD;  Location: [facility] INVASIVE CV LAB;  Service: Cardiovascular;  Laterality: Left;   ORIF ANKLE FRACTURE Left 03/26/2017   Procedure: OPEN REDUCTION INTERNAL FIXATION (ORIF) ANKLE FRACTURE;  Surgeon: Christena Flake, MD;  Location: [facility] ORS;  Service: Orthopedics;  Laterality: Left;    FAMILY HISTORY: Family History  Problem Relation Age of Onset   CVA Mother    Diabetes Mother    CAD Father    Breast cancer Neg Hx     ADVANCED DIRECTIVES (Y/N):  N  HEALTH MAINTENANCE: Social History   Tobacco Use   Smoking status: Former    Current packs/day: 1.00    Average packs/day: 1 pack/day for 40.0 years (40.0 ttl pk-yrs)    Types: Cigarettes   Smokeless tobacco: Never   Tobacco comments:    Quite 2015  Vaping Use   Vaping status: Never Used  Substance Use Topics   Alcohol use: No   Drug  use: No     Colonoscopy:  PAP:  Bone density:  Lipid panel:  No Known Allergies  Current Outpatient Medications  Medication Sig Dispense Refill   albuterol (VENTOLIN HFA) 108 (90 Base) MCG/ACT inhaler Inhale 2 puffs into the lungs every 6 (six) hours as needed for wheezing or shortness of breath. 1 each 1   amLODipine (NORVASC) 10 MG tablet Take 10 mg by mouth daily.     aspirin EC 81 MG tablet Take 1 tablet (81 mg total) by mouth daily. Swallow whole. 150 tablet 2   atorvastatin (LIPITOR) 80 MG tablet Take 80 mg by mouth daily.     buPROPion (WELLBUTRIN XL) 300 MG 24 hr tablet TAKE 1 TABLET BY MOUTH DAILY 90 tablet 3   Cholecalciferol (VITAMIN D3) 50 MCG (2000 UT) capsule Take 2,000 Units by mouth daily.     citalopram (CELEXA) 20 MG tablet Take 20 mg by mouth daily.     cloNIDine (CATAPRES) 0.2 MG tablet Take 0.2 mg by mouth 2 (two) times daily.     clopidogrel (PLAVIX) 75 MG tablet Take 1 tablet (75 mg total) by mouth daily. 90 tablet 3   cyanocobalamin (VITAMIN B12) 1000 MCG tablet Take 1 tablet (1,000 mcg total) by mouth daily. 90 tablet 3   fluticasone-salmeterol (WIXELA INHUB) 250-50 MCG/ACT AEPB Inhale 1 puff into the lungs in the morning and at bedtime. 180 each 6   glipiZIDE (GLUCOTROL XL) 5 MG 24 hr tablet Take 1 tablet (5 mg total) by mouth daily. 30 tablet 1   insulin aspart (NOVOLOG) 100 UNIT/ML injection Inject 7 Units into the skin 3 (three) times daily before meals.     insulin degludec (TRESIBA FLEXTOUCH) 100 UNIT/ML FlexTouch Pen Inject 25 Units into the skin daily. 25 units once daily - REPLACES Levemir     Iron, Ferrous Sulfate, 325 (65 Fe) MG TABS Take 325 mg by mouth every other day. 90 tablet 3   losartan-hydrochlorothiazide (HYZAAR) 100-25 MG tablet Take 1 tablet by mouth daily.     metoprolol succinate (TOPROL-XL) 50 MG 24 hr tablet Take 1 tablet by mouth daily.     montelukast (SINGULAIR) 10 MG tablet Take 10 mg by mouth at bedtime.     ondansetron (ZOFRAN) 8  MG tablet Take 1 tablet (8 mg total) by mouth every 8 (eight) hours as needed for nausea or vomiting. 30 tablet 0   ONETOUCH VERIO test strip 1 each by Other route 3 (three) times daily. 100 each 10   Semaglutide,0.25 or 0.5MG /DOS, 2 MG/3ML SOPN Inject 0.5 mg into the skin once a week. 3 mL 3   No current facility-administered medications for this visit.    OBJECTIVE: There were no vitals filed for this visit.    There is no height or weight on file to calculate BMI.    ECOG FS:1 - Symptomatic but completely ambulatory  General: Well-developed, well-nourished, no acute distress. Eyes: Pink conjunctiva, anicteric sclera. HEENT: Normocephalic, moist mucous membranes. Lungs: No audible wheezing or coughing. Heart: Regular rate and rhythm. Abdomen: Soft, nontender,  no obvious distention. Musculoskeletal: No edema, cyanosis, or clubbing. Neuro: Alert, answering all questions appropriately. Cranial nerves grossly intact. Skin: No rashes or petechiae noted. Psych: Normal affect.  LAB RESULTS:  Lab Results  Component Value Date   NA 137 07/03/2023   K 3.6 07/03/2023   CL 100 07/03/2023   CO2 30 07/03/2023   GLUCOSE 81 07/03/2023   BUN 20 07/03/2023   CREATININE 0.97 07/03/2023   CALCIUM 8.6 (L) 07/03/2023   PROT 5.5 (L) 07/03/2023   ALBUMIN 3.2 (L) 07/03/2023   AST 16 07/03/2023   ALT 13 07/03/2023   ALKPHOS 60 07/03/2023   BILITOT 0.7 07/03/2023   GFRNONAA >60 07/03/2023   GFRAA >60 03/27/2017    Lab Results  Component Value Date   WBC 4.6 07/03/2023   NEUTROABS 3.3 07/03/2023   HGB 9.5 (L) 07/03/2023   HCT 30.3 (L) 07/03/2023   MCV 88.9 07/03/2023   PLT 258 07/03/2023     STUDIES: NM PET Image Restag (PS) Skull Base To Thigh Result Date: 07/02/2023 CLINICAL DATA:  Subsequent treatment strategy for endometrial carcinoma. EXAM: NUCLEAR MEDICINE PET SKULL BASE TO THIGH TECHNIQUE: 9.47 mCi F-18 FDG was injected intravenously. Full-ring PET imaging was performed from the  skull base to thigh after the radiotracer. CT data was obtained and used for attenuation correction and anatomic localization. Fasting blood glucose: 106 mg/dl COMPARISON:  PET-CT 54/12/8117 and 08/10/2022 FINDINGS: Mediastinal blood pool activity: SUV max 10.39 Liver activity: SUV max NA NECK: No hypermetabolic lymph nodes in the neck. Incidental CT findings: Left carotid artery calcifications. CHEST: No hypermetabolic mediastinal or hilar nodes. No suspicious pulmonary nodules on the CT scan. Incidental CT findings: Stable atherosclerotic calcifications involving the aorta and branch vessels including three-vessel coronary artery calcifications and mitral valve annular calcifications and aortic valve calcifications. Stable hiatal hernia. Scattered tree-in-bud type nodularity suggesting chronic inflammation or atypical infection such as MAC. Patchy E right lower lobe atelectasis. ABDOMEN/PELVIS: No abnormal hypermetabolic activity within the liver, pancreas, adrenal glands, or spleen. Significant and progressive hypermetabolism in the endometrium with SUV max of 16.53 consistent with recurrent/progressive endometrial cancer. This area was much smaller and less hypermetabolic on the prior PET-CT with SUV max of 4.83. Stable small bilateral external lymph nodes. SUV max is 2.10 which is stable. There is an enlarging right common iliac node on image 99/6. This measures 9 mm and previously measured 6 mm. SUV max is 5.61 was previously 3.0. The left common iliac node measures 8 mm and is unchanged. No hypermetabolism. Incidental CT findings: Stable advanced vascular disease. Both ovaries are still present and appear normal. SKELETON: No focal hypermetabolic activity to suggest skeletal metastasis. Incidental CT findings: None IMPRESSION: 1. Significant and progressive hypermetabolism in the endometrium consistent with recurrent/progressive endometrial cancer. 2. Enlarging right common iliac node with increasing  hypermetabolism consistent with metastatic disease. Other small common iliac and external iliac lymph nodes are stable. 3. No findings for other sites of metastatic disease. 4. New scattered tree-in-bud type nodularity suggesting chronic inflammation or atypical infection such as MAC. This is most notable in the right lung. 5. Aortic atherosclerosis. Electronically Signed   By: Rudie Meyer M.D.   On: 07/02/2023 10:07   VAS [place] ABI WITH/WO TBI Result Date: 06/20/2023  LOWER EXTREMITY DOPPLER STUDY Patient Name:  Terri [name]  Date of Exam:   06/18/2023 Medical Rec #: [id number]    Accession #:    [id number] Date of Birth: [birth year]    Patient Gender: F Patient  Age:   73 years Exam Location:  [facility] Procedure:      VAS [place] ABI WITH/WO TBI Referring Phys: JASON DEW --------------------------------------------------------------------------------  Indications: Claudication, and peripheral artery disease. High Risk Factors: Hypertension, hyperlipidemia, past history of smoking. Other Factors: Over 10 years bilateral calf claudication.   Vascular Interventions: 02/24/2023: Aortogram and Selective Left Lower Extremity                         Angiogram. PTA of the Left SFA with 5 mm diameter by 15                         cm Length Lutonix drug coated angioplasty balloon. Stent                         placement to the Left SFA with 6 mm diameter by 12 cm                         Length Life stent. PTA of the Left EIA and Proximal CFA                         with 6 mm diameter by 12 cm length Lutonix drug coated                         angioplasty. Stent placement to the Right CIA with 9 mm                         diameter by 38 mm length Lifestream Stent. Performing Technologist: Hardie Lora RVT  Examination Guidelines: A complete evaluation includes at minimum, Doppler waveform signals and systolic blood pressure reading at the level of bilateral brachial, anterior tibial, and posterior tibial arteries,  when vessel segments are accessible. Bilateral testing is considered an integral part of a complete examination. Photoelectric Plethysmograph (PPG) waveforms and toe systolic pressure readings are included as required and additional duplex testing as needed. Limited examinations for reoccurring indications may be performed as noted.  ABI Findings: +---------+------------------+-----+---------+--------+ Right    Rt Pressure (mmHg)IndexWaveform Comment  +---------+------------------+-----+---------+--------+ Brachial 160                                      +---------+------------------+-----+---------+--------+ PTA      150               0.94 triphasic         +---------+------------------+-----+---------+--------+ DP       164               1.02 triphasic         +---------+------------------+-----+---------+--------+ Great Toe122               0.76                   +---------+------------------+-----+---------+--------+ +---------+------------------+-----+---------+-------+ Left     Lt Pressure (mmHg)IndexWaveform Comment +---------+------------------+-----+---------+-------+ Brachial 154                                     +---------+------------------+-----+---------+-------+ PTA      175  1.09 triphasic        +---------+------------------+-----+---------+-------+ DP       183               1.14 triphasic        +---------+------------------+-----+---------+-------+ Great Toe134               0.84                  +---------+------------------+-----+---------+-------+ +-------+-----------+-----------+------------+------------+ ABI/TBIToday's ABIToday's TBIPrevious ABIPrevious TBI +-------+-----------+-----------+------------+------------+ Right  1.02       0.76       0.87        1.00         +-------+-----------+-----------+------------+------------+ Left   1.09       0.84       0.94        0.82          +-------+-----------+-----------+------------+------------+ Right ABIs appear increased compared to prior study on 03/04/2023. Left ABIs appear essentially unchanged compared to prior study on 03/04/2023.  Summary: Right: Resting right ankle-brachial index is within normal range. The right toe-brachial index is normal. Left: Resting left ankle-brachial index is within normal range. The left toe-brachial index is normal. *See table(s) above for measurements and observations.  Electronically signed by Festus Barren MD on 06/20/2023 at 10:02:56 AM.    Final    NM Cardiac Muga Rest Result Date: 06/18/2023 CLINICAL DATA:  Evaluate cardiac function. Patient undergoing chemotherapy for endometrial cancer. EXAM: NUCLEAR MEDICINE CARDIAC BLOOD POOL IMAGING (MUGA) TECHNIQUE: Cardiac multi-gated acquisition was performed at rest following intravenous injection of Tc-58m labeled red blood cells. RADIOPHARMACEUTICALS:  21.27 mCi Tc-3m pertechnetate in-vitro labeled red blood cells IV COMPARISON:  Prior study 04/09/2023 FINDINGS: Normal left ventricular wall motion. No akinetic or dyskinetic segments. The left ventricular ejection fraction is calculated at 70.8%. This was 66.3% on the prior study. Again noted is diffuse uptake in the spleen. IMPRESSION: Left ventricular ejection fraction 70.8%. Electronically Signed   By: Rudie Meyer M.D.   On: 06/18/2023 10:17   CT Head Wo Contrast Result Date: 06/05/2023 CLINICAL DATA:  Fall, head and neck injury EXAM: CT HEAD WITHOUT CONTRAST CT CERVICAL SPINE WITHOUT CONTRAST TECHNIQUE: Multidetector CT imaging of the head and cervical spine was performed following the standard protocol without intravenous contrast. Multiplanar CT image reconstructions of the cervical spine were also generated. RADIATION DOSE REDUCTION: This exam was performed according to the departmental dose-optimization program which includes automated exposure control, adjustment of the mA and/or kV according to  patient size and/or use of iterative reconstruction technique. COMPARISON:  12/19/2022 FINDINGS: CT HEAD FINDINGS Brain: No evidence of acute infarction, hemorrhage, hydrocephalus, extra-axial collection or mass lesion/mass effect. Periventricular white matter hypodensity. Vascular: No hyperdense vessel or unexpected calcification. Skull: Normal. Negative for fracture or focal lesion. Sinuses/Orbits: No acute finding. Other: Soft tissue contusion and hematoma of the right forehead (series 2, image 16). CT CERVICAL SPINE FINDINGS Alignment: Normal. Skull base and vertebrae: No acute fracture. No primary bone lesion or focal pathologic process. Soft tissues and spinal canal: No prevertebral fluid or swelling. No visible canal hematoma. Disc levels: Moderate multilevel cervical disc degenerative disease. Upper chest: Negative. Other: Partially callused, subacute fracture of the posterior right third rib (series 6, image 87). IMPRESSION: 1. No acute intracranial pathology. Small-vessel white matter disease. 2. Soft tissue contusion and hematoma of the right forehead. 3. No fracture or static subluxation of the cervical spine. 4. Moderate multilevel cervical disc degenerative disease. Electronically Signed  By: Jearld Lesch M.D.   On: 06/05/2023 14:10   CT Cervical Spine Wo Contrast Result Date: 06/05/2023 CLINICAL DATA:  Fall, head and neck injury EXAM: CT HEAD WITHOUT CONTRAST CT CERVICAL SPINE WITHOUT CONTRAST TECHNIQUE: Multidetector CT imaging of the head and cervical spine was performed following the standard protocol without intravenous contrast. Multiplanar CT image reconstructions of the cervical spine were also generated. RADIATION DOSE REDUCTION: This exam was performed according to the departmental dose-optimization program which includes automated exposure control, adjustment of the mA and/or kV according to patient size and/or use of iterative reconstruction technique. COMPARISON:  12/19/2022 FINDINGS:  CT HEAD FINDINGS Brain: No evidence of acute infarction, hemorrhage, hydrocephalus, extra-axial collection or mass lesion/mass effect. Periventricular white matter hypodensity. Vascular: No hyperdense vessel or unexpected calcification. Skull: Normal. Negative for fracture or focal lesion. Sinuses/Orbits: No acute finding. Other: Soft tissue contusion and hematoma of the right forehead (series 2, image 16). CT CERVICAL SPINE FINDINGS Alignment: Normal. Skull base and vertebrae: No acute fracture. No primary bone lesion or focal pathologic process. Soft tissues and spinal canal: No prevertebral fluid or swelling. No visible canal hematoma. Disc levels: Moderate multilevel cervical disc degenerative disease. Upper chest: Negative. Other: Partially callused, subacute fracture of the posterior right third rib (series 6, image 87). IMPRESSION: 1. No acute intracranial pathology. Small-vessel white matter disease. 2. Soft tissue contusion and hematoma of the right forehead. 3. No fracture or static subluxation of the cervical spine. 4. Moderate multilevel cervical disc degenerative disease. Electronically Signed   By: Jearld Lesch M.D.   On: 06/05/2023 14:10     ONCOLOGY HISTORY: Patient was initially hesitant to undergo chemotherapy and elected to do XRT only which was completed on April 18, 2022.  CT scan results from October 08, 2022 reviewed independently with no obvious evidence of progressive disease. She completed 6 cycles of carboplatinum, Taxol, and Keytruda on October 03, 2022.  Patient then initiated maintenance Keytruda on October 23, 2022.  PET scan results from January 15, 2023 reviewed independently with mild nonspecific residual hypermetabolism in the lower uterine segment as well as small bilateral common iliac nodes possibly suggesting nodal metastasis.  Patient was seen by gynecology oncology who determined surgical intervention is not an option.  They recommend discontinuing Keytruda for progressive  disease and initiating single agent Doxil every 28 days.  Patient received 3 cycles of Doxil before ration of disease.  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  MUGA on June 17, 2023 revealed an EF of 70.8%.  PET scan results from June 14, 2023 reviewed independently and reported as above with significant progression of disease.  Case discussed with gynecology oncology.  Will discontinue Doxil and patient return to clinic in 1 week to receive single agent Taxol on days 1, 8, and 15 with day 22 off.  Will reimage after 3 cycles.  Can also consider lenvatinib or possibly gemcitabine in the future.  Return to clinic in 1 week for consideration of cycle 1, day 1.   Port: Port revision successful.  Proceed with treatment as above. Leukopenia: Resolved. Anemia: Chronic and unchanged.  Patient's hemoglobin is 9.5.  Gynecology oncology cauterized several areas of bleeding.   thrombocytopenia: Resolved. Hypoglycemia: Improved. Vaginal bleeding: Secondary to progressive disease.  Follow-up with gynecology oncology as scheduled. Claudication symptoms: Resolved.  Patient underwent revascularization procedure on February 04, 2023. Right hamstring  tendon rupture: Patient reports there is no plan for surgical repair. Rash: Likely secondary to Doxil.  Significantly improved with steroids.  Discontinue Doxil as above.  Patient expressed understanding and was in agreement with this plan. She also understands that She can call clinic at any time with any questions, concerns, or complaints.    Cancer Staging  Endometrial cancer [facility]) Staging form: Corpus Uteri - Carcinoma and Carcinosarcoma, AJCC 8th Edition - Clinical stage from 05/30/2022: FIGO Stage IIIB (cT3b, cN0, cM0) - Signed by Jeralyn Ruths, MD on 05/30/2022 Stage prefix: Initial  diagnosis   Jeralyn Ruths, MD   07/03/2023 11:35 AM

## 2023-07-03 NOTE — Progress Notes (Signed)
 Pharmacist Chemotherapy Monitoring - Initial Assessment    Anticipated start date: 07/11/23   The following has been reviewed per standard work regarding the patient's treatment regimen: The patient's diagnosis, treatment plan and drug doses, and organ/hematologic function Lab orders and baseline tests specific to treatment regimen  The treatment plan start date, drug sequencing, and pre-medications Prior authorization status  Patient's documented medication list, including drug-drug interaction screen and prescriptions for anti-emetics and supportive care specific to the treatment regimen The drug concentrations, fluid compatibility, administration routes, and timing of the medications to be used The patient's access for treatment and lifetime cumulative dose history, if applicable  The patient's medication allergies and previous infusion related reactions, if applicable   Changes made to treatment plan:  N/A  Follow up needed:  N/A   Sharen Hones, PharmD, BCPS Clinical Pharmacist   07/03/2023  1:18 PM

## 2023-07-03 NOTE — Progress Notes (Signed)
 Gynecologic Oncology Consult Visit   Referring Provider: Dr. 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 female who is seen in consultation from Dr. 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   She was last seen by Terri [name] 02/27/23.   02/27/23- Cervix, biopsy,  :       - INVOLVEMENT BY Terri [name].       - THERE IS SUFFICIENT MATERIAL FOR ANCILLARY MOLECULAR TESTING BLOCK 1A).   At that time, she was felt to not be a good candidate for surgery due to extent of vaginal recurrence and PET positive common iliac nodes in addition to radiation effect in the vagina potentially complicating healing. Additionally, she has significant medical comorbidities and had been falling. We discussed options including doxil vs clinical trial with TROP2 ADC however she declined trial d/t concerns with transportation.   Foundation testing was sent - 03/06/23- HRDsig Negative, MS stable, TMB 0, PTEN loss exons 4-9, PPP2R1A S256F, TP53 R282W  She received 3 cycles of Doxil.   She was admitted to hospital in late November 2024 for hypoxia secondary to COPD exacerbation. She was treated with steroids and antibiotics.   She began having vaginal bleeding x 1 month ago and declined evaluation. She suffered a fall 06/2023 while leaning forward from chair and suffered contusions, laceration, and hematoma to face.   06/14/23- PET  COMPARISON:  PET-CT 01/15/2023 and 08/10/2022 FINDINGS: Mediastinal blood pool activity: SUV max 10.39 Liver activity: SUV max NA NECK: No hypermetabolic lymph nodes in the neck. Incidental CT findings: Left carotid artery calcifications. CHEST: No hypermetabolic mediastinal or hilar nodes. No suspicious pulmonary nodules on the CT scan. Incidental CT findings: Stable  atherosclerotic calcifications involving the aorta and branch vessels including three-vessel coronary artery calcifications and mitral valve annular calcifications and aortic valve calcifications. Stable hiatal hernia. Scattered tree-in-bud type nodularity suggesting chronic inflammation or atypical infection such as MAC. Patchy right lower lobe atelectasis. ABDOMEN/PELVIS: No abnormal hypermetabolic activity within the liver, pancreas, adrenal glands, or spleen. Significant and progressive hypermetabolism in the endometrium with SUV max of 16.53 consistent with recurrent/progressive endometrial cancer. This area was much smaller and less hypermetabolic on the prior PET-CT with SUV max of 4.83. Stable small bilateral external lymph nodes. SUV max is 2.10 which is stable. There is an enlarging right common iliac node on image 99/6. This measures 9 mm and previously measured 6 mm. SUV max is 5.61 was previously 3.0. The left common iliac node measures 8 mm and is unchanged. No hypermetabolism. Incidental CT findings: Stable advanced vascular disease. Both ovaries are still present and appear normal. SKELETON: No focal hypermetabolic activity to suggest skeletal metastasis. Incidental CT findings: None   IMPRESSION: 1. Significant and progressive hypermetabolism in the endometrium consistent with recurrent/progressive endometrial cancer. 2. Enlarging right common iliac node with increasing hypermetabolism consistent with metastatic disease. Other small common iliac and external iliac lymph nodes are stable. 3. No findings for other sites of metastatic disease. 4. New scattered tree-in-bud type nodularity suggesting chronic inflammation or atypical infection such as MAC. This is most notable in the right lung. 5. Aortic atherosclerosis.      Gyn Oncology History She was referred by Dr. Judithann [name] to Dr. Jean [name] for reports of postmenopausal bleeding for 6 months, may be more.  Scant.  Became heavier.  Additionally has urinary incontinence.    On exam, friable cervical vs vaginal mass was partially visualized though exam was limited. Biopsy was performed.   Cervical Biopsy: poorly differentiated [name], favor endometrial origin. Positive for p53 and ER. Focal staining for p16, negative for napsin and p63. HER2- 1+ negative. MSI/MMR- Stable  Colonoscopy was scheduled but prep inadequate.    PET/CT EXAM: 10/23 Marked diffuse hypermetabolic activity throughout the entire uterus and cervix, consistent with malignancy. This favors endometrial [name] over cervical [name]. Borderline enlarged bilateral common iliac lymph nodes noted, but without FDG uptake. No definite evidence of metastatic disease by PET.  Patient seen 01/24/22 and found to have extensive cervical and upper vaginal involvement.  Only having light bleeding.  Decision made to treat her with primary radiation therapy because of extensive involvement of cervix and adjacent vagina, and she was reluctant to consider chemotherapy.  She received primary pelvic external radiation 50 Gy, completed 04/18/22.   05/15/22- CT Chest abdomen Pelvis w contrast IMPRESSION: 1. Central uterine hypoattenuation likely represents residual endometrial primary. 2. Borderline sized common iliac nodes are similar to the prior PET and were not hypermetabolic on that exam. Favored to be reactive. Otherwise, no evidence of metastatic disease in the abdomen or pelvis. 3. Scattered tiny pulmonary nodules are subpleural predominant and favored to represent subpleural lymph nodes. These can be re-evaluated at follow-up. 4.  Tiny hiatal hernia. 5. Interval nonacute anterior left third and fourth rib fractures. 6. Aortic atherosclerosis (ICD10-I70.0) and emphysema (ICD10-J43.9).  Treated with Carboplatin/Taxol and Keytruda x 3 cycles with excellent clinical response clinically.   CT/PET 4/24 shows response in the uterus.  See below. IMPRESSION: 1.  Interval decrease in radiotracer uptake associated with the uterus compatible with response to therapy. SUV max is currently equal to 4.22, compared with 21.8 previously. 2. No significant change in size of bilateral common iliac lymph nodes with mild, nonspecific FDG uptake. 3. No new sites of disease identified. 4. Diffusely increased bone marrow activity is new compared with the previous exam and likely reflects treatment related changes.  PAP 01/16/23 AGUS favor cancer  PET scan  01/15/23  - IMPRESSION: Mild residual hypermetabolism in the lower uterine segment, nonspecific. Residual/viable tumor is not excluded.   - Small bilateral common iliac nodes, with associated mild hypermetabolism, suggesting small nodal metastases.  - Healing fractures of the right anterior 3rd and 4th ribs, posttraumatic.  04/10/23- started Doxil chemotherapy  Feb 2025- vaginal bleeding/spotting x 1 month   Treatment Summary:  03/13/22- 04/18/22- pelvic radiation for extensive vaginal involvement of malignancy 05/15/22- CT C/A/P consistent with residual endometrial primary, borderline common iliac nodes similar to pet and not hypermetabolic. Scattered pulmonary nodules.  06/20/22- Cycle 1 Carbo-Taxol-Keytruda 07/11/22- Cycle 2 Carbo-taxol-keytruda 08/01/22- Cycle 3 Carbo-taxol-keytruda 08/14/22- PET - consistent with response to therapy 08/22/22- Cycle 4 Carbo-taxol-keytruda 09/12/22- Cycle 5 carbo-taxol-keytruda 10/03/22- Cycle 6 carbo-taxol-keytruda 10/12/22- CT C/A/P - unchanged appearance. No worsening disease. Radiation effects.  10/23/22- maintenance Rande Lawman  11/14/22- Rande Lawman 12/05/22- Rande Lawman 12/26/22- keytruda 01/15/23- PET concerning for progressive disease in lower uterine segment and iliac nodes 01/16/23- AGUS favoring cancer Pap 02/06/23- Keytruda 10/30/24Rande Lawman; [facility] [employer]. Second line doxil discussed. Felt not to be a surgical candidate.  02/27/23- Cervix, biopsy, - endometrial  [name] 04/10/23- D1C1 - Doxil 50 mg/m2 05/08/23- D1C2 Doxil 50 mg/m2 06/05/23- D1C3 Doxil 50 mg/m2 06/2023- vaginal bleeding  Genetic Testing  Tumor Testing: [facility] CDx: 03/06/23- HRDsig Negative, MS stable, TMB 0, PTEN loss exons 4-9, PPP2R1A S256F,  TP53 R282W  Testing on 12/28/2021 specimen P53 positive, ER positive  Problem List: Patient Active Problem List   Diagnosis Date Noted   Rash 07/01/2023   Muscle strain of chest wall 07/01/2023   Personal history of fall 04/28/2023   COPD exacerbation (HCC) 03/29/2023   Dyspnea on exertion 02/02/2023   Vitamin D deficiency 02/02/2023   Vitamin B 12 deficiency 02/02/2023   Need for hepatitis C screening test 02/02/2023   Screening for osteoporosis 02/02/2023   Need for influenza vaccination 02/02/2023   Diabetic eye exam (HCC) 02/02/2023   Aortic atherosclerosis (HCC) 02/02/2023   Bradycardia 01/14/2023   Anemia 09/28/2022   Postmenopausal bleeding 01/30/2022   Endometrial cancer (HCC) 01/25/2022   Mild aortic stenosis 10/12/2020   Essential hypertension 11/05/2017   Closed fracture of left ankle 03/29/2017   Ankle fracture, left 03/26/2017   Acute respiratory failure with hypoxia (HCC) 08/05/2015   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   Hyperlipemia, mixed 04/05/2015   HTN, goal below 140/80 04/05/2015   Obesity (BMI 30-39.9) 04/05/2015   Type 2 diabetes mellitus with complication, with long-term current use of insulin (HCC) 04/05/2015   Class 2 severe obesity due to excess calories with serious comorbidity in adult (HCC) 08/19/2013   Obesity, Class II, BMI 35-39.9, with comorbidity 08/19/2013   Chronic obstructive pulmonary disease, unspecified (HCC) 07/31/2012   Atherosclerosis of native arteries of extremity with intermittent claudication (HCC) 03/12/2012   Tobacco use 03/12/2012   Hyperlipidemia associated with type 2 diabetes mellitus (HCC) 06/06/2011   Hypertension associated with diabetes (HCC) 06/06/2011   Mood disorder  (HCC) 06/06/2011   Type 2 diabetes with complication (HCC) 06/06/2011    Past Medical History: Past Medical History:  Diagnosis Date   Adenomatous colon polyp    Aortic stenosis    Arthritis    Asthma    Cancer (HCC)    Claudication (HCC)    COPD (chronic obstructive pulmonary disease) (HCC)    Depression    Diabetes mellitus without complication (HCC)    Esophageal dysphagia    GERD (gastroesophageal reflux disease)    Hyperlipemia    Hypertension    Obesity    Severe obesity (BMI 35.0-35.9 with comorbidity) (HCC)     Past Surgical History: Past Surgical History:  Procedure Laterality Date   BLADDER SURGERY     COLONOSCOPY WITH PROPOFOL N/A 08/05/2015   Procedure: COLONOSCOPY WITH PROPOFOL;  Surgeon: Elnita Maxwell, MD;  Location: [facility] ENDOSCOPY;  Service: Endoscopy;  Laterality: N/A;   ESOPHAGOGASTRODUODENOSCOPY N/A 01/09/2022   Procedure: ESOPHAGOGASTRODUODENOSCOPY (EGD);  Surgeon: Regis Bill, MD;  Location: [facility] ENDOSCOPY;  Service: Endoscopy;  Laterality: N/A;   FOOT SURGERY Left    FRACTURE SURGERY     IR CV LINE INJECTION  06/22/2022   IR IMAGING GUIDED PORT INSERTION  06/05/2022   IR PORT REPAIR CENTRAL VENOUS ACCESS DEVICE  07/02/2022   LOWER EXTREMITY ANGIOGRAPHY Left 02/04/2023   Procedure: Lower Extremity Angiography;  Surgeon: Annice Needy, MD;  Location: [facility] INVASIVE CV LAB;  Service: Cardiovascular;  Laterality: Left;   ORIF ANKLE FRACTURE Left 03/26/2017   Procedure: OPEN REDUCTION INTERNAL FIXATION (ORIF) ANKLE FRACTURE;  Surgeon: Christena Flake, MD;  Location: [facility] ORS;  Service: Orthopedics;  Laterality: Left;    OB History:  G17P0 OB History  No obstetric history on file.    Family History: Family History  Problem Relation Age of Onset   CVA Mother    Diabetes Mother    CAD Father  Breast cancer Neg Hx     Social History: Social History   Socioeconomic History   Marital status: Single    Spouse name: Not on file   Number of  children: Not on file   Years of education: Not on file   Highest education level: Not on file  Occupational History   Not on file  Tobacco Use   Smoking status: Former    Current packs/day: 1.00    Average packs/day: 1 pack/day for 40.0 years (40.0 ttl pk-yrs)    Types: Cigarettes   Smokeless tobacco: Never   Tobacco comments:    Quite 2015  Vaping Use   Vaping status: Never Used  Substance and Sexual Activity   Alcohol use: No   Drug use: No   Sexual activity: Not on file  Other Topics Concern   Not on file  Social History Narrative   Lives alone.  Maximino Greenland, here with her today.  Indoor cats.     Social Drivers of Corporate investment banker Strain: Low Risk  (12/20/2022)   Received from [facility]   Overall Financial Resource Strain (CARDIA)    Difficulty of Paying Living Expenses: Not very hard  Food Insecurity: No Food Insecurity (12/20/2022)   [facility]   Hunger Vital Sign    Worried About Running Out of Food in the Last Year: Never true    Ran Out of Food in the Last Year: Never true  Transportation Needs: Unknown (12/20/2022)   Received from [facility] - Transportation    In the past 12 months, has lack of transportation kept you from medical appointments or from getting medications?: No    Lack of Transportation (Non-Medical): Not on file  Physical Activity: Inactive (09/20/2022)   Exercise Vital Sign    Days of Exercise per Week: 0 days    Minutes of Exercise per Session: 0 min  Stress: Stress Concern Present (09/20/2022)   [employer] of Occupational Health - Occupational Stress Questionnaire    Feeling of Stress : To some extent  Social Connections: Socially Isolated (09/20/2022)   Social Connection and Isolation Panel [NHANES]    Frequency of Communication with Friends and Family: Once a week    Frequency of Social Gatherings with Friends and Family: Never    Attends  Religious Services: Never    Database administrator or Organizations: No    Attends Banker Meetings: Never    Marital Status: Never married  Intimate Partner Violence: Not At Risk (05/30/2022)   Humiliation, Afraid, Rape, and Kick questionnaire    Fear of Current or Ex-Partner: No    Emotionally Abused: No    Physically Abused: No    Sexually Abused: No    Allergies: No Known Allergies  Current Medications: Current Outpatient Medications  Medication Sig Dispense Refill   albuterol (VENTOLIN HFA) 108 (90 Base) MCG/ACT inhaler Inhale 2 puffs into the lungs every 6 (six) hours as needed for wheezing or shortness of breath. 1 each 1   amLODipine (NORVASC) 10 MG tablet Take 10 mg by mouth daily.     aspirin EC 81 MG tablet Take 1 tablet (81 mg total) by mouth daily. Swallow whole. 150 tablet 2   atorvastatin (LIPITOR) 80 MG tablet Take 80 mg by mouth daily.     buPROPion (WELLBUTRIN XL) 300 MG 24 hr tablet TAKE 1 TABLET BY MOUTH DAILY 90 tablet 3  Cholecalciferol (VITAMIN D3) 50 MCG (2000 UT) capsule Take 2,000 Units by mouth daily.     citalopram (CELEXA) 20 MG tablet Take 20 mg by mouth daily.     cloNIDine (CATAPRES) 0.2 MG tablet Take 0.2 mg by mouth 2 (two) times daily.     clopidogrel (PLAVIX) 75 MG tablet Take 1 tablet (75 mg total) by mouth daily. 90 tablet 3   cyanocobalamin (VITAMIN B12) 1000 MCG tablet Take 1 tablet (1,000 mcg total) by mouth daily. 90 tablet 3   fluticasone-salmeterol (WIXELA INHUB) 250-50 MCG/ACT AEPB Inhale 1 puff into the lungs in the morning and at bedtime. 180 each 6   glipiZIDE (GLUCOTROL XL) 5 MG 24 hr tablet Take 1 tablet (5 mg total) by mouth daily. 30 tablet 1   insulin aspart (NOVOLOG) 100 UNIT/ML injection Inject 7 Units into the skin 3 (three) times daily before meals.     insulin degludec (TRESIBA FLEXTOUCH) 100 UNIT/ML FlexTouch Pen Inject 25 Units into the skin daily. 25 units once daily - REPLACES Levemir     Iron, Ferrous  Sulfate, 325 (65 Fe) MG TABS Take 325 mg by mouth every other day. 90 tablet 3   losartan-hydrochlorothiazide (HYZAAR) 100-25 MG tablet Take 1 tablet by mouth daily.     metoprolol succinate (TOPROL-XL) 50 MG 24 hr tablet Take 1 tablet by mouth daily.     montelukast (SINGULAIR) 10 MG tablet Take 10 mg by mouth at bedtime.     ondansetron (ZOFRAN) 8 MG tablet Take 1 tablet (8 mg total) by mouth every 8 (eight) hours as needed for nausea or vomiting. 30 tablet 0   ONETOUCH VERIO test strip 1 each by Other route 3 (three) times daily. 100 each 10   Semaglutide,0.25 or 0.5MG /DOS, 2 MG/3ML SOPN Inject 0.5 mg into the skin once a week. 3 mL 3   No current facility-administered medications for this visit.    Review of Systems General:  fatigue Skin: no complaints Eyes: no complaints HEENT: no complaints Breasts: no complaints Pulmonary: no complaints Cardiac: no complaints Gastrointestinal: decreased appetite and constipation; no complaints Ob/Gyn: vaginal bleeding o/w no complaints Musculoskeletal: no complaints Hematology: no complaints Neurologic/Psych: depression   Objective:  Physical Examination:  BP (!) 153/62   Pulse (!) 58   Temp (!) 96.8 F (36 C)   Resp 19   Wt 171 lb (77.6 kg)   SpO2 98%   BMI 31.28 kg/m     ECOG Performance Status: 2  GENERAL: Patient is a elderly appearing female in no acute distress HEENT:  Sclera clear. Anicteric NODES:  Negative axillary, supraclavicular, inguinal lymph node survery LUNGS:  Normal respiratory rate ABDOMEN:  Soft, nontender.  No hernias, incisions well healed. No masses or ascites EXTREMITIES:  No peripheral edema. Atraumatic. No cyanosis SKIN:  abdominal confluent erythematous rash with desquamation covering the anterior abdomen, thighs and hips NEURO:  Nonfocal. Well oriented.  Appropriate affect.  Pelvic exam: Chaperoned by CMA deferred EGBUS: no lesions Cervix/Vagina: narrow with obvious recurrent cancer 2 cm replacing  cervix and some involvement of adjacent anterior vaginal wall. Treated with Monsel's solution for bleeding. After treatment hemostasis noted.  BME deferred   Lab Review Lab Results  Component Value Date   WBC 4.6 07/03/2023   HGB 9.5 (L) 07/03/2023   HCT 30.3 (L) 07/03/2023   MCV 88.9 07/03/2023   PLT 258 07/03/2023     Chemistry      Component Value Date/Time   NA 137 07/03/2023 0836  K 3.6 07/03/2023 0836   CL 100 07/03/2023 0836   CO2 30 07/03/2023 0836   BUN 20 07/03/2023 0836   CREATININE 0.97 07/03/2023 0836      Component Value Date/Time   CALCIUM 8.6 (L) 07/03/2023 0836   ALKPHOS 60 07/03/2023 0836   AST 16 07/03/2023 0836   ALT 13 07/03/2023 0836   BILITOT 0.7 07/03/2023 0836        Assessment:  Terri [name] is a 73 y.o. 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  PET scan shows involvement of entire uterus and cervix.  No distant disease. Bilateral common iliac nodes are borderline enlarged, but not PET positive.   Poorly differentiated cancer with TP53 overexpression and negative HPV test so unlikely to be cervical primary. She was hesitant for chemotherapy and elected for radiation in view of her locally advanced disease.  Completed radiation 04/18/22 and on exam still had fleshy cancer involving upper vagina and flush cervix.  CT scan continued to show hypoattenuation of uterus cw persistent disease, but no evidence of distant disease.  Discussed that she was not a surgical candidate due to extensive upper vaginal involvement with cancer and decision made to start systemic therapy.  She received three cycles of carboplatin/taxol/keytruda and PET/CT 4/24 showed considerable decrease SUV in the uterus and no distant disease.  On exam 08/15/22 the vaginal disease had regressed dramatically. Completed 6 cycles of carboplain/taxol/keytruda 10/04/22.  CT scan showed no evidence  of residual disease. Continued on maintenance Keytruda.    Some vaginal spotting 9/24, but no obvious evidence of disease on exam.  PET scan showed mild activity in lower uterine segment and two high common iliac nodes 7-8 mm. PAP AGUS concerning for cancer. Doxil x 3 cycles with progressive disease.   Active bleeding due to vaginal/cervical disease, treated with Monsel's solution today with excellent hemostasis.   Rash due to Doxil  Cancer is TP53 mutated, MSS and HER2 negative. ER positive  Medical co-morbidities complicating care: obesity, COPD, aortic stenosis, PVD.  Plan:   Problem List Items Addressed This Visit       Musculoskeletal and Integument   Rash     Genitourinary   Endometrial cancer (HCC) - Primary     Other   Anemia   Other Visit Diagnoses       Vaginal bleeding         Counseling and coordination of care           We discussed treatment options and she would like to continue treatment which she will refer discuss further with Dr. Orlie Dakin.  I briefly reviewed option including weekly paclitaxel as well as everolimus combined with letrozole.  She has a poorly differentiated cancer with p53 abnormal expression.  We can assess with pathology if they are able to determine the histologic subtype.  Her ER status is positive and we will request further evaluation regarding extend of positivity and request PR status. Dependent on her hormone status she may be a candidate for alternating Megace and tamoxifen in the future.  She will follow-up with Dr. Irving Copas again today for treatment recommendation.  Previously she was deemed to not be a good candidate for surgery due to extent of vaginal recurrence and PET positive common iliac nodes and there is significant radiation effect in the vagina, so surgery could be problematic in terms of healing.  And she also has significant medical comorbidities that would  increase perioperative risk.  Options for therapy include Doxil or  clinical trial with TROP2 ADC.  However, she does not want to travel to [facility] for treatment.  Lives alone and uses [facility] transport.   Palliative care evaluation today regarding treatment options for her symptomatic rash.   RTC in 3 months or sooner based on her symptoms.  She is aware that additional treatment with Monsel's, packing or embolization may be needed to control vaginal bleeding  I personally interviewed and examined the patient.  I determined the assessment and planPatient/family questions were answered. Consuello Masse, NP scribed the note.    Evelena Asa, MD   CC:  Dana Allan, MD [address] [place],  [place] [address] [phone]

## 2023-07-04 ENCOUNTER — Other Ambulatory Visit: Payer: Self-pay

## 2023-07-04 ENCOUNTER — Ambulatory Visit: Payer: PPO | Attending: Student in an Organized Health Care Education/Training Program

## 2023-07-04 ENCOUNTER — Encounter: Payer: Self-pay | Admitting: Oncology

## 2023-07-04 DIAGNOSIS — Z87891 Personal history of nicotine dependence: Secondary | ICD-10-CM | POA: Diagnosis not present

## 2023-07-04 DIAGNOSIS — R0602 Shortness of breath: Secondary | ICD-10-CM | POA: Insufficient documentation

## 2023-07-04 DIAGNOSIS — R0609 Other forms of dyspnea: Secondary | ICD-10-CM | POA: Diagnosis not present

## 2023-07-04 LAB — PULMONARY FUNCTION TEST ARMC ONLY
FEF 25-75 Post: 1.75 L/s
FEF 25-75 Pre: 0.89 L/s
FEF2575-%Change-Post: 97 %
FEF2575-%Pred-Post: 106 %
FEF2575-%Pred-Pre: 54 %
FEV1-%Change-Post: 30 %
FEV1-%Pred-Post: 72 %
FEV1-%Pred-Pre: 55 %
FEV1-Post: 1.43 L
FEV1-Pre: 1.1 L
FEV1FVC-%Change-Post: -9 %
FEV1FVC-%Pred-Pre: 98 %
FEV6-%Change-Post: 44 %
FEV6-%Pred-Post: 84 %
FEV6-%Pred-Pre: 58 %
FEV6-Post: 2.14 L
FEV6-Pre: 1.48 L
FEV6FVC-%Pred-Post: 105 %
FEV6FVC-%Pred-Pre: 105 %
FVC-%Change-Post: 44 %
FVC-%Pred-Post: 80 %
FVC-%Pred-Pre: 55 %
FVC-Post: 2.14 L
FVC-Pre: 1.48 L
Post FEV1/FVC ratio: 67 %
Post FEV6/FVC ratio: 100 %
Pre FEV1/FVC ratio: 74 %
Pre FEV6/FVC Ratio: 100 %
RV % pred: 139 %
RV: 3.01 L
TLC % pred: 93 %
TLC: 4.44 L

## 2023-07-04 MED ORDER — ALBUTEROL SULFATE (2.5 MG/3ML) 0.083% IN NEBU
2.5000 mg | INHALATION_SOLUTION | Freq: Once | RESPIRATORY_TRACT | Status: AC
  Administered 2023-07-04: 2.5 mg via RESPIRATORY_TRACT
  Filled 2023-07-04: qty 3

## 2023-07-09 ENCOUNTER — Encounter: Payer: Self-pay | Admitting: Oncology

## 2023-07-09 ENCOUNTER — Other Ambulatory Visit

## 2023-07-09 DIAGNOSIS — Z794 Long term (current) use of insulin: Secondary | ICD-10-CM

## 2023-07-09 DIAGNOSIS — E118 Type 2 diabetes mellitus with unspecified complications: Secondary | ICD-10-CM

## 2023-07-09 NOTE — Progress Notes (Unsigned)
 07/09/2023 Name: Terri [name] MRN: [id number] DOB: [birth year]  Subjective  No chief complaint on file.  Reason for visit: ?  Terri [name] is a 73 y.o. female with a history of diabetes (type 2), who presents today for a brief diabetes follow up to review home glucose readings.   Pertinent PMH also includes atherosclerosis w intermittent claudication, HTN, COPD, HLD, Endometrial cancer, obesity, .  Known DM Complications:  nephropathy w microalbuminuria; PAD with claudication BLE; HTN    Care Team: Primary Care Provider: Dana Allan, MD Pulmonology Dr. Aundria Rud; Oncology Dr. Orlie Dakin; Cardiology at [facility]   Since Last visit / History of Present Illness: ?  Patient reports implementing plan from last visit. However, realized she never actually increased her Ozempic dose to 0.5 as discussed with PCP last month. Has been using 0.25 mg dose.    Notes she Starts a new chemo medication on Thursday.   Reported DM Regimen: ?  semaglutide (Ozempic) (reports she forgot to increase her dose, has been using 0.25) glipizide XL 5 mg once daily  Novolog (aspart) 7 units three times daily Tresiba U100  25  units  daily   DM medications tried in the past:?  Metformin (diarrhea); switched to glipizide  SMBG: Glucometer - Checks BG twice daily via glucometer Date  FBG (mg/dL) 2/95  75 2/84  79 _______________________ 3/4  160 3/3  118 - last dose of steroid 3/2  121    2/29  156   2/28  205 2/27  313 2/26  156 - steroid course begin 2/25  82 2/24  89  2/23  113  2/21  99   Hypo/Hyperglycemia: ?  Symptoms of hypoglycemia since last visit:? no  If yes, it was treated by: n/a   Reported Diet:  Patient typically eats 3 meals per day. In the past couple of days has been less hungry which is not usual for her. Notes she still attempts to eat something even if not hungry. Breakfast (8:30): Varies Lunch (12-1pm): Pizza (under 50 carbs); Sandwich; Chicken Engineer, petroleum (7-7:30 pm):  Largest meal of the day Snacks: Sweet tooth (states that all sweets are sugar-free).  Exercise: No  DM Prevention:  Statin: Taking; high intensity.?  History of chronic kidney disease? yes History of albuminuria? yes, last UACR on 01/24/23 = 100.4 mg/g ACE/ARB - Taking; Urine MA/CR Ratio - elevated urinary albumin excretion.  Last foot exam: 02/25/2023 Tobacco Use: Former   Cardiovascular Risk Reduction History of clinical ASCVD?  PAD w Claudication The 10-year ASCVD risk score (Arnett DK, et al., 2019) is: 39.7% History of heart failure? no History of hyperlipidemia? yes Current BMI: 31.8 kg/m2 (Ht 62 in, Wt 78.9 kg) Taking statin? yes; high intensity (atorvastaitn 80 mg) Taking aspirin? indicated (secondary prevention);  Taking clopidogrel    Taking SGLT-2i? no Taking GLP- 1 RA? yes     _______________________________________________  Objective    Review of Systems:? Limited in the setting of virtual visit  GI:? No nausea, vomiting, constipation, diarrhea, abdominal pain, dyspepsia, change in bowel habits  Endocrine:? No polyuria, polyphagia or blurred vision    Physical Examination:  Vitals:  Wt Readings from Last 3 Encounters:  07/03/23 171 lb (77.6 kg)  06/26/23 174 lb (78.9 kg)  06/25/23 176 lb 1.6 oz (79.9 kg)   BP Readings from Last 3 Encounters:  07/03/23 (!) 153/62  06/26/23 136/60  06/25/23 (!) 144/58   Pulse Readings from Last 3 Encounters:  07/03/23 (!) 58  06/26/23 72  06/25/23 (!) 57   Labs:?  Lab Results  Component Value Date   HGBA1C 5.7 (A) 06/26/2023   HGBA1C 7.2 (H) 12/25/2022   GLUCOSE 81 07/03/2023   MICRALBCREAT 10.4 01/24/2023   CREATININE 0.97 07/03/2023   CREATININE 1.15 (H) 06/05/2023   CREATININE 1.04 (H) 05/08/2023   GFR 52.91 (L) 01/24/2023   Lab Results  Component Value Date   CHOL 156 01/24/2023   LDLCALC 87 01/24/2023   HDL 46.20 01/24/2023   TRIG 112.0 01/24/2023   ALT 13 07/03/2023   ALT 10 06/05/2023   AST 16  07/03/2023   AST 16 06/05/2023     Chemistry      Component Value Date/Time   NA 137 07/03/2023 0836   K 3.6 07/03/2023 0836   CL 100 07/03/2023 0836   CO2 30 07/03/2023 0836   BUN 20 07/03/2023 0836   CREATININE 0.97 07/03/2023 0836      Component Value Date/Time   CALCIUM 8.6 (L) 07/03/2023 0836   ALKPHOS 60 07/03/2023 0836   AST 16 07/03/2023 0836   ALT 13 07/03/2023 0836   BILITOT 0.7 07/03/2023 0836     The 10-year ASCVD risk score (Arnett DK, et al., 2019) is: 39.7%  Assessment and Plan:   1. Diabetes, type 2: controlled A1c 5.7% (06/26/23), goal <7.5-8% without hypoglycemia given medical complexity and active cancer treatment. BG have been acutely elevated in the setting of oral steroid course (completed 07/01/23).  Today, fasting sugars are back at goal without hypoglycemia, most recently upper 70s, none lower. Notably, reports noticing she never increased Ozempic to 0.5 mg, has been using 0.25 mg. She is agreeable to CGM device if covered which will benefit guiding insulin reduction. Advised to continue current regimen.If CGM covered, will schedule for teaching/setup visit. Current regimen: Ozempic 0.25 mg/wk, glipizide XL 5 mg daily, Tresiba 25 units daily, Novolog 7 units TIDAC Glucometer data shows resolution of hyperglycemia 2/2 previous steroid use early march. Continue medications today without changes.  Libre 3+ Rx sent to home pharmacy - if $0, will schedule office visit for training CGM copay in 2024 was too high, even through DME. This year, has HTA which should have $0 copay for testing supplies.  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  Does have hx urinary incontinence, though don't expect significant diuresis with A1c 5.7%. Ideally would allow for d/c glipizide and reduction in insulin dose(s).  Proteinuria risk warrants SGLT2i.  Will likely qualify for free SGLT2 trough PAP  TZD: Avoiding due to possible weight gain/increase in fracture risk. Metformin: Hx intolerance   Follow up PCP fu scheduled 2.5 months.  Future Appointments  Date Time Provider Department Center  07/09/2023  2:00 PM [facility] PHARMACIST [facility]  07/11/2023  9:30 AM [facility] None  07/11/2023 10:30 AM [facility] FLUSH [facility] None  07/11/2023 11:00 AM Jeralyn Ruths, MD [facility] None  07/11/2023 11:30 AM [facility] INFUSION CHAIR 9 [facility] None  07/16/2023  3:00 PM Dgayli, NWGNFA, MD [facility] None  07/17/2023  8:30 AM [facility] None  07/17/2023  9:00 AM [facility] FL[facility] None  07/17/2023  9:30 AM Jeralyn Ruths, MD [facility] None  07/17/2023 10:00 AM [facility]AIR 16 [facility] None  07/23/2023  7:30 AM [facility] None  07/23/2023  8:15 AM [facility] FL[facility] None  07/23/2023  8:45 AM Jeralyn Ruths, MD [facility] None  07/23/2023  9:15 AM [facility] CHAIR 17 [facility] None  08/06/2023  8:30 AM [facility] None  08/06/2023  9:00 AM [facility] FL[facility] None  08/06/2023  9:30 AM Jeralyn Ruths, MD [facility] None  08/06/2023 10:00 AM [facility] CHAIR 21 [facility] None  08/14/2023  7:30 AM [facility] None  08/14/2023  8:15 AM [facility] FL[facility] None  08/14/2023  8:45 AM Orlie Dakin, Tollie Pizza, MD [facility] None  08/14/2023  9:15 AM [facility] INFUSION CHAIR 10 [facility] None  08/21/2023  7:30 AM [facility] None  08/21/2023  8:15 AM [facility] FL[facility] None  08/21/2023  8:45 AM Orlie Dakin, Tollie Pizza, MD [facility] None  08/21/2023  9:15 AM [facility] INFUSION CHAIR 11 [facility] None  09/23/2023  1:00 PM Dana Allan, MD [facility]  10/02/2023  8:30 AM [facility] None  10/02/2023  9:30 AM [facility] GYN ONC [facility] None  12/17/2023  2:00 PM [facility] VASC 2 [facility] None  12/17/2023  3:00 PM Dew, Marlow Baars, MD  [facility] None   Loree Fee, PharmD Clinical Pharmacist [facility] [phone]

## 2023-07-10 ENCOUNTER — Ambulatory Visit: Payer: PPO

## 2023-07-10 MED ORDER — FREESTYLE LIBRE 3 READER DEVI: 0 refills | Status: DC

## 2023-07-10 MED ORDER — FREESTYLE LIBRE 3 PLUS SENSOR MISC: 3 refills | Status: DC

## 2023-07-11 ENCOUNTER — Inpatient Hospital Stay

## 2023-07-11 ENCOUNTER — Inpatient Hospital Stay (HOSPITAL_BASED_OUTPATIENT_CLINIC_OR_DEPARTMENT_OTHER): Admitting: Oncology

## 2023-07-11 ENCOUNTER — Encounter: Payer: Self-pay | Admitting: Oncology

## 2023-07-11 ENCOUNTER — Encounter: Payer: Self-pay | Admitting: Pharmacist

## 2023-07-11 ENCOUNTER — Telehealth: Payer: Self-pay

## 2023-07-11 ENCOUNTER — Ambulatory Visit: Payer: PPO | Admitting: Student in an Organized Health Care Education/Training Program

## 2023-07-11 VITALS — BP 151/66 | HR 70 | Resp 16

## 2023-07-11 VITALS — BP 128/59 | HR 66 | Temp 97.9°F | Resp 16 | Ht 62.0 in | Wt 168.0 lb

## 2023-07-11 DIAGNOSIS — Z5111 Encounter for antineoplastic chemotherapy: Secondary | ICD-10-CM | POA: Diagnosis not present

## 2023-07-11 DIAGNOSIS — C541 Malignant neoplasm of endometrium: Secondary | ICD-10-CM

## 2023-07-11 LAB — CBC WITH DIFFERENTIAL (CANCER CENTER ONLY)
Abs Immature Granulocytes: 0.06 10*3/uL (ref 0.00–0.07)
Basophils Absolute: 0 10*3/uL (ref 0.0–0.1)
Basophils Relative: 0 %
Eosinophils Absolute: 0 10*3/uL (ref 0.0–0.5)
Eosinophils Relative: 0 %
HCT: 28.8 % — ABNORMAL LOW (ref 36.0–46.0)
Hemoglobin: 9.1 g/dL — ABNORMAL LOW (ref 12.0–15.0)
Immature Granulocytes: 1 %
Lymphocytes Relative: 2 %
Lymphs Abs: 0.2 10*3/uL — ABNORMAL LOW (ref 0.7–4.0)
MCH: 28.3 pg (ref 26.0–34.0)
MCHC: 31.6 g/dL (ref 30.0–36.0)
MCV: 89.7 fL (ref 80.0–100.0)
Monocytes Absolute: 0.9 10*3/uL (ref 0.1–1.0)
Monocytes Relative: 9 %
Neutro Abs: 8.8 10*3/uL — ABNORMAL HIGH (ref 1.7–7.7)
Neutrophils Relative %: 88 %
Platelet Count: 130 10*3/uL — ABNORMAL LOW (ref 150–400)
RBC: 3.21 MIL/uL — ABNORMAL LOW (ref 3.87–5.11)
RDW: 18.4 % — ABNORMAL HIGH (ref 11.5–15.5)
WBC Count: 10 10*3/uL (ref 4.0–10.5)
nRBC: 0 % (ref 0.0–0.2)

## 2023-07-11 LAB — CMP (CANCER CENTER ONLY)
ALT: 13 U/L (ref 0–44)
AST: 15 U/L (ref 15–41)
Albumin: 3.1 g/dL — ABNORMAL LOW (ref 3.5–5.0)
Alkaline Phosphatase: 67 U/L (ref 38–126)
Anion gap: 7 (ref 5–15)
BUN: 17 mg/dL (ref 8–23)
CO2: 27 mmol/L (ref 22–32)
Calcium: 8.2 mg/dL — ABNORMAL LOW (ref 8.9–10.3)
Chloride: 102 mmol/L (ref 98–111)
Creatinine: 0.94 mg/dL (ref 0.44–1.00)
GFR, Estimated: >60 mL/min (ref >60)
Glucose, Bld: 273 mg/dL — ABNORMAL HIGH (ref 70–99)
Potassium: 3.6 mmol/L (ref 3.5–5.1)
Sodium: 136 mmol/L (ref 135–145)
Total Bilirubin: 0.6 mg/dL (ref 0.0–1.2)
Total Protein: 5.6 g/dL — ABNORMAL LOW (ref 6.5–8.1)

## 2023-07-11 LAB — SAMPLE TO BLOOD BANK

## 2023-07-11 MED ORDER — HEPARIN SOD (PORK) LOCK FLUSH 100 UNIT/ML IV SOLN
500.0000 [IU] | Freq: Once | INTRAVENOUS | Status: AC | PRN
  Administered 2023-07-11: 500 [IU]
  Filled 2023-07-11: qty 5

## 2023-07-11 MED ORDER — SODIUM CHLORIDE 0.9 % IV SOLN
80.0000 mg/m2 | Freq: Once | INTRAVENOUS | Status: AC
  Administered 2023-07-11: 150 mg via INTRAVENOUS
  Filled 2023-07-11: qty 25

## 2023-07-11 MED ORDER — FAMOTIDINE IN NACL 20-0.9 MG/50ML-% IV SOLN
20.0000 mg | Freq: Once | INTRAVENOUS | Status: AC
  Administered 2023-07-11: 20 mg via INTRAVENOUS
  Filled 2023-07-11: qty 50

## 2023-07-11 MED ORDER — DEXAMETHASONE SODIUM PHOSPHATE 10 MG/ML IJ SOLN
10.0000 mg | Freq: Once | INTRAMUSCULAR | Status: AC
  Administered 2023-07-11: 10 mg via INTRAVENOUS
  Filled 2023-07-11: qty 1

## 2023-07-11 MED ORDER — DIPHENHYDRAMINE HCL 50 MG/ML IJ SOLN
25.0000 mg | Freq: Once | INTRAMUSCULAR | Status: AC
  Administered 2023-07-11: 25 mg via INTRAVENOUS
  Filled 2023-07-11: qty 1

## 2023-07-11 MED ORDER — SODIUM CHLORIDE 0.9 % IV SOLN
INTRAVENOUS | Status: DC
  Filled 2023-07-11: qty 250

## 2023-07-11 MED ORDER — MEGESTROL ACETATE 40 MG PO TABS: 40.0000 mg | ORAL_TABLET | Freq: Every day | ORAL | 2 refills | Status: DC

## 2023-07-11 NOTE — Progress Notes (Signed)
 Called patient's pharmacy to confirm, CGM is covered.   Libre 3 plus sensor and reader covered for $0 per month.  Mychart message sent to patient.

## 2023-07-11 NOTE — Progress Notes (Signed)
 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  INTERVAL HISTORY: Patient returns to clinic today for further evaluation and initiation of cycle 1, day 1 of Taxol.  Her rash is significantly improved.  She once again had a fall earlier this week, but did not seek medical attention.  She does not complain of vaginal bleeding today.  She has no neurologic complaints.  She denies any recent fevers or illnesses.  She has no chest pain, shortness of breath, cough, or hemoptysis.  She denies any nausea, vomiting, constipation, or diarrhea.  She has no urinary complaints.  Patient offers no further specific complaints today.  REVIEW OF SYSTEMS:   Review of Systems  Constitutional:  Positive for malaise/fatigue and weight loss. Negative for fever.  Respiratory: Negative.  Negative for cough, hemoptysis and shortness of breath.   Cardiovascular: Negative.  Negative for chest pain and leg swelling.  Gastrointestinal: Negative.  Negative for abdominal pain, blood in stool and melena.  Genitourinary: Negative.  Negative for dysuria.  Musculoskeletal:  Positive for falls. Negative for back pain.  Skin:  Positive for rash.  Neurological:  Positive for weakness. Negative for focal weakness and headaches.  Psychiatric/Behavioral: Negative.  The patient is not nervous/anxious.     As per HPI. Otherwise, a complete review of systems is negative.  PAST MEDICAL HISTORY: Past Medical History:  Diagnosis Date   Adenomatous colon polyp    Aortic  stenosis    Arthritis    Asthma    Cancer (HCC)    Claudication (HCC)    COPD (chronic obstructive pulmonary disease) (HCC)    Depression    Diabetes mellitus without complication (HCC)    Esophageal dysphagia    GERD (gastroesophageal reflux disease)    Hyperlipemia    Hypertension    Obesity    Severe obesity (BMI 35.0-35.9 with comorbidity) (HCC)     PAST SURGICAL HISTORY: Past Surgical History:  Procedure Laterality Date   BLADDER SURGERY     COLONOSCOPY WITH PROPOFOL N/A 08/05/2015   Procedure: COLONOSCOPY WITH PROPOFOL;  Surgeon: Elnita Maxwell, MD;  Location: [facility] ENDOSCOPY;  Service: Endoscopy;  Laterality: N/A;   ESOPHAGOGASTRODUODENOSCOPY N/A 01/09/2022   Procedure: ESOPHAGOGASTRODUODENOSCOPY (EGD);  Surgeon: Regis Bill, MD;  Location: [facility] ENDOSCOPY;  Service: Endoscopy;  Laterality: N/A;   FOOT SURGERY Left    FRACTURE SURGERY     IR CV LINE INJECTION  06/22/2022   IR IMAGING GUIDED PORT INSERTION  06/05/2022   IR PORT REPAIR CENTRAL VENOUS ACCESS DEVICE  07/02/2022   LOWER EXTREMITY ANGIOGRAPHY Left 02/04/2023   Procedure: Lower Extremity Angiography;  Surgeon: Annice Needy, MD;  Location: [facility] INVASIVE CV LAB;  Service: Cardiovascular;  Laterality: Left;   ORIF ANKLE FRACTURE Left 03/26/2017   Procedure: OPEN REDUCTION INTERNAL FIXATION (ORIF) ANKLE FRACTURE;  Surgeon: Christena Flake, MD;  Location: [facility] ORS;  Service: Orthopedics;  Laterality: Left;    FAMILY HISTORY: Family History  Problem Relation Age of Onset   CVA Mother    Diabetes Mother    CAD Father    Breast cancer Neg Hx  ADVANCED DIRECTIVES (Y/N):  N  HEALTH MAINTENANCE: Social History   Tobacco Use   Smoking status: Former    Current packs/day: 1.00    Average packs/day: 1 pack/day for 40.0 years (40.0 ttl pk-yrs)    Types: Cigarettes   Smokeless tobacco: Never   Tobacco comments:    Quite 2015  Vaping Use   Vaping status: Never Used  Substance Use Topics   Alcohol use:  No   Drug use: No     Colonoscopy:  PAP:  Bone density:  Lipid panel:  No Known Allergies  Current Outpatient Medications  Medication Sig Dispense Refill   albuterol (VENTOLIN HFA) 108 (90 Base) MCG/ACT inhaler Inhale 2 puffs into the lungs every 6 (six) hours as needed for wheezing or shortness of breath. 1 each 1   amLODipine (NORVASC) 10 MG tablet Take 10 mg by mouth daily.     aspirin EC 81 MG tablet Take 1 tablet (81 mg total) by mouth daily. Swallow whole. 150 tablet 2   atorvastatin (LIPITOR) 80 MG tablet Take 80 mg by mouth daily.     buPROPion (WELLBUTRIN XL) 300 MG 24 hr tablet TAKE 1 TABLET BY MOUTH DAILY 90 tablet 3   Cholecalciferol (VITAMIN D3) 50 MCG (2000 UT) capsule Take 2,000 Units by mouth daily.     citalopram (CELEXA) 20 MG tablet Take 20 mg by mouth daily.     cloNIDine (CATAPRES) 0.2 MG tablet Take 0.2 mg by mouth 2 (two) times daily.     clopidogrel (PLAVIX) 75 MG tablet Take 1 tablet (75 mg total) by mouth daily. 90 tablet 3   Continuous Glucose Receiver (FREESTYLE LIBRE 3 READER) DEVI Use to check blood glucose continuously. Dx: E11.8, Z79.4 1 each 0   Continuous Glucose Sensor (FREESTYLE LIBRE 3 PLUS SENSOR) MISC Change sensor every 15 days. E11.8, Z79.4. 6 each 3   cyanocobalamin (VITAMIN B12) 1000 MCG tablet Take 1 tablet (1,000 mcg total) by mouth daily. 90 tablet 3   fluticasone-salmeterol (WIXELA INHUB) 250-50 MCG/ACT AEPB Inhale 1 puff into the lungs in the morning and at bedtime. 180 each 6   glipiZIDE (GLUCOTROL XL) 5 MG 24 hr tablet Take 1 tablet (5 mg total) by mouth daily. 30 tablet 1   insulin aspart (NOVOLOG) 100 UNIT/ML injection Inject 7 Units into the skin 3 (three) times daily before meals.     insulin degludec (TRESIBA FLEXTOUCH) 100 UNIT/ML FlexTouch Pen Inject 25 Units into the skin daily. 25 units once daily - REPLACES Levemir     Iron, Ferrous Sulfate, 325 (65 Fe) MG TABS Take 325 mg by mouth every other day. 90 tablet 3    losartan-hydrochlorothiazide (HYZAAR) 100-25 MG tablet Take 1 tablet by mouth daily.     metoprolol succinate (TOPROL-XL) 50 MG 24 hr tablet Take 1 tablet by mouth daily.     montelukast (SINGULAIR) 10 MG tablet Take 10 mg by mouth at bedtime.     ondansetron (ZOFRAN) 8 MG tablet Take 1 tablet (8 mg total) by mouth every 8 (eight) hours as needed for nausea or vomiting. 30 tablet 0   ONETOUCH VERIO test strip 1 each by Other route 3 (three) times daily. 100 each 10   Semaglutide,0.25 or 0.5MG /DOS, 2 MG/3ML SOPN Inject 0.5 mg into the skin once a week. 3 mL 3   No current facility-administered medications for this visit.    OBJECTIVE: Vitals:   07/11/23 1046  BP: (!) 128/59  Pulse: 66  Resp: 16  Temp: 97.9 F (36.6 C)  SpO2: 97%      Body mass index is 30.73 kg/m.    ECOG FS:1 - Symptomatic but completely ambulatory  General: Well-developed, well-nourished, no acute distress. Eyes: Pink conjunctiva, anicteric sclera. HEENT: Normocephalic, moist mucous membranes. Lungs: No audible wheezing or coughing. Heart: Regular rate and rhythm. Abdomen: Soft, nontender, no obvious distention. Musculoskeletal: No edema, cyanosis, or clubbing. Neuro: Alert, answering all questions appropriately. Cranial nerves grossly intact. Skin: No rashes or petechiae noted. Psych: Normal affect.  LAB RESULTS:  Lab Results  Component Value Date   NA 136 07/11/2023   K 3.6 07/11/2023   CL 102 07/11/2023   CO2 27 07/11/2023   GLUCOSE 273 (H) 07/11/2023   BUN 17 07/11/2023   CREATININE 0.94 07/11/2023   CALCIUM 8.2 (L) 07/11/2023   PROT 5.6 (L) 07/11/2023   ALBUMIN 3.1 (L) 07/11/2023   AST 15 07/11/2023   ALT 13 07/11/2023   ALKPHOS 67 07/11/2023   BILITOT 0.6 07/11/2023   GFRNONAA >60 07/11/2023   GFRAA >60 03/27/2017    Lab Results  Component Value Date   WBC 10.0 07/11/2023   NEUTROABS 8.8 (H) 07/11/2023   HGB 9.1 (L) 07/11/2023   HCT 28.8 (L) 07/11/2023   MCV 89.7 07/11/2023   PLT  130 (L) 07/11/2023     STUDIES: NM PET Image Restag (PS) Skull Base To Thigh Result Date: 07/02/2023 CLINICAL DATA:  Subsequent treatment strategy for endometrial carcinoma. EXAM: NUCLEAR MEDICINE PET SKULL BASE TO THIGH TECHNIQUE: 9.47 mCi F-18 FDG was injected intravenously. Full-ring PET imaging was performed from the skull base to thigh after the radiotracer. CT data was obtained and used for attenuation correction and anatomic localization. Fasting blood glucose: 106 mg/dl COMPARISON:  PET-CT 16/01/9603 and 08/10/2022 FINDINGS: Mediastinal blood pool activity: SUV max 10.39 Liver activity: SUV max NA NECK: No hypermetabolic lymph nodes in the neck. Incidental CT findings: Left carotid artery calcifications. CHEST: No hypermetabolic mediastinal or hilar nodes. No suspicious pulmonary nodules on the CT scan. Incidental CT findings: Stable atherosclerotic calcifications involving the aorta and branch vessels including three-vessel coronary artery calcifications and mitral valve annular calcifications and aortic valve calcifications. Stable hiatal hernia. Scattered tree-in-bud type nodularity suggesting chronic inflammation or atypical infection such as MAC. Patchy E right lower lobe atelectasis. ABDOMEN/PELVIS: No abnormal hypermetabolic activity within the liver, pancreas, adrenal glands, or spleen. Significant and progressive hypermetabolism in the endometrium with SUV max of 16.53 consistent with recurrent/progressive endometrial cancer. This area was much smaller and less hypermetabolic on the prior PET-CT with SUV max of 4.83. Stable small bilateral external lymph nodes. SUV max is 2.10 which is stable. There is an enlarging right common iliac node on image 99/6. This measures 9 mm and previously measured 6 mm. SUV max is 5.61 was previously 3.0. The left common iliac node measures 8 mm and is unchanged. No hypermetabolism. Incidental CT findings: Stable advanced vascular disease. Both ovaries are still  present and appear normal. SKELETON: No focal hypermetabolic activity to suggest skeletal metastasis. Incidental CT findings: None IMPRESSION: 1. Significant and progressive hypermetabolism in the endometrium consistent with recurrent/progressive endometrial cancer. 2. Enlarging right common iliac node with increasing hypermetabolism consistent with metastatic disease. Other small common iliac and external iliac lymph nodes are stable. 3. No findings for other sites of metastatic disease. 4. New scattered tree-in-bud type nodularity suggesting chronic inflammation or atypical infection such as MAC. This is most notable in the right lung. 5. Aortic atherosclerosis. Electronically Signed  By: Rudie Meyer M.D.   On: 07/02/2023 10:07   VAS [place] ABI WITH/WO TBI Result Date: 06/20/2023  LOWER EXTREMITY DOPPLER STUDY Patient Name:  BIRDENA KINGMA  Date of Exam:   06/18/2023 Medical Rec #: [id number]    Accession #:    [id number] Date of Birth: 10/23/1950    Patient Gender: F Patient Age:   41 years Exam Location:  [facility] Procedure:      VAS [place] ABI WITH/WO TBI Referring Phys: Festus Barren --------------------------------------------------------------------------------  Indications: Claudication, and peripheral artery disease. High Risk Factors: Hypertension, hyperlipidemia, past history of smoking. Other Factors: Over 10 years bilateral calf claudication.   Vascular Interventions: 02/24/2023: Aortogram and Selective Left Lower Extremity                         Angiogram. PTA of the Left SFA with 5 mm diameter by 15                         cm Length Lutonix drug coated angioplasty balloon. Stent                         placement to the Left SFA with 6 mm diameter by 12 cm                         Length Life stent. PTA of the Left EIA and Proximal CFA                         with 6 mm diameter by 12 cm length Lutonix drug coated                         angioplasty. Stent placement to the Right CIA with 9 mm                          diameter by 38 mm length Lifestream Stent. Performing Technologist: Hardie Lora RVT  Examination Guidelines: A complete evaluation includes at minimum, Doppler waveform signals and systolic blood pressure reading at the level of bilateral brachial, anterior tibial, and posterior tibial arteries, when vessel segments are accessible. Bilateral testing is considered an integral part of a complete examination. Photoelectric Plethysmograph (PPG) waveforms and toe systolic pressure readings are included as required and additional duplex testing as needed. Limited examinations for reoccurring indications may be performed as noted.  ABI Findings: +---------+------------------+-----+---------+--------+ Right    Rt Pressure (mmHg)IndexWaveform Comment  +---------+------------------+-----+---------+--------+ Brachial 160                                      +---------+------------------+-----+---------+--------+ PTA      150               0.94 triphasic         +---------+------------------+-----+---------+--------+ DP       164               1.02 triphasic         +---------+------------------+-----+---------+--------+ Great Toe122               0.76                   +---------+------------------+-----+---------+--------+ +---------+------------------+-----+---------+-------+ Left  Lt Pressure (mmHg)IndexWaveform Comment +---------+------------------+-----+---------+-------+ Brachial 154                                     +---------+------------------+-----+---------+-------+ PTA      175               1.09 triphasic        +---------+------------------+-----+---------+-------+ DP       183               1.14 triphasic        +---------+------------------+-----+---------+-------+ Great Toe134               0.84                  +---------+------------------+-----+---------+-------+ +-------+-----------+-----------+------------+------------+  ABI/TBIToday's ABIToday's TBIPrevious ABIPrevious TBI +-------+-----------+-----------+------------+------------+ Right  1.02       0.76       0.87        1.00         +-------+-----------+-----------+------------+------------+ Left   1.09       0.84       0.94        0.82         +-------+-----------+-----------+------------+------------+ Right ABIs appear increased compared to prior study on 03/04/2023. Left ABIs appear essentially unchanged compared to prior study on 03/04/2023.  Summary: Right: Resting right ankle-brachial index is within normal range. The right toe-brachial index is normal. Left: Resting left ankle-brachial index is within normal range. The left toe-brachial index is normal. *See table(s) above for measurements and observations.  Electronically signed by Festus Barren MD on 06/20/2023 at 10:02:56 AM.    Final    NM Cardiac Muga Rest Result Date: 06/18/2023 CLINICAL DATA:  Evaluate cardiac function. Patient undergoing chemotherapy for endometrial cancer. EXAM: NUCLEAR MEDICINE CARDIAC BLOOD POOL IMAGING (MUGA) TECHNIQUE: Cardiac multi-gated acquisition was performed at rest following intravenous injection of Tc-3m labeled red blood cells. RADIOPHARMACEUTICALS:  21.27 mCi Tc-35m pertechnetate in-vitro labeled red blood cells IV COMPARISON:  Prior study 04/09/2023 FINDINGS: Normal left ventricular wall motion. No akinetic or dyskinetic segments. The left ventricular ejection fraction is calculated at 70.8%. This was 66.3% on the prior study. Again noted is diffuse uptake in the spleen. IMPRESSION: Left ventricular ejection fraction 70.8%. Electronically Signed   By: Rudie Meyer M.D.   On: 06/18/2023 10:17     ONCOLOGY HISTORY: Patient was initially hesitant to undergo chemotherapy and elected to do XRT only which was completed on April 18, 2022.  CT scan results from October 08, 2022 reviewed independently with no obvious evidence of progressive disease. She completed 6  cycles of carboplatinum, Taxol, and Keytruda on October 03, 2022.  Patient then initiated maintenance Keytruda on October 23, 2022.  PET scan results from January 15, 2023 reviewed independently with mild nonspecific residual hypermetabolism in the lower uterine segment as well as small bilateral common iliac nodes possibly suggesting nodal metastasis.  Patient was seen by gynecology oncology who determined surgical intervention is not an option.  They recommend discontinuing Keytruda for progressive disease and initiating single agent Doxil every 28 days.  Patient received 3 cycles of Doxil before ration of disease.  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  MUGA on June 17, 2023 revealed an EF of 70.8%.  PET scan results from June 14, 2023 reviewed independently and reported as above with significant progression of disease.  Case discussed with gynecology oncology.  Will discontinue Doxil.  Patient will now receive single agent Taxol on days 1, 8, and 15 with day 22 off.  Will reimage after 3 cycles.  Can also consider lenvatinib or possibly gemcitabine in the future.  Proceed with cycle 1, day 1 of treatment today.  Return to clinic in 1 week for further evaluation and consideration of cycle 1, day 8.  Port: Port revision successful.  Proceed with treatment as above. Leukopenia: Resolved. Anemia: H trending down slightly to 9.1.  Monitor.  Previously, gynecology oncology cauterized several areas of bleeding.   thrombocytopenia: Resolved. Vaginal bleeding: Secondary to progressive disease.  Follow-up with gynecology oncology as scheduled. Claudication symptoms: Resolved.  Patient underwent revascularization procedure on February 04, 2023. Right hamstring tendon rupture: Patient reports there is no plan for surgical repair. Rash: Likely secondary to  Doxil.  Significantly improved with steroids.  Discontinue Doxil as above. Poor appetite/weight loss: Patient was given a prescription for Megace.  Consider dietary referral in the future.  Patient expressed understanding and was in agreement with this plan. She also understands that She can call clinic at any time with any questions, concerns, or complaints.    Cancer Staging  Endometrial cancer [facility]) Staging form: Corpus Uteri - Carcinoma and Carcinosarcoma, AJCC 8th Edition - Clinical stage from 05/30/2022: FIGO Stage IIIB (cT3b, cN0, cM0) - Signed by Jeralyn Ruths, MD on 05/30/2022 Stage prefix: Initial diagnosis   Jeralyn Ruths, MD   07/11/2023 11:27 AM

## 2023-07-11 NOTE — Patient Instructions (Signed)
 [facility] - A DEPT OF [facility]  Discharge Instructions: Thank you for choosing [facility] to provide your oncology and hematology care.  If you have a lab appointment with the Cancer Center, please go directly to the Cancer Center and check in at the registration area.  Wear comfortable clothing and clothing appropriate for easy access to any Portacath or PICC line.   We strive to give you quality time with your provider. You may need to reschedule your appointment if you arrive late (15 or more minutes).  Arriving late affects you and other patients whose appointments are after yours.  Also, if you miss three or more appointments without notifying the office, you may be dismissed from the clinic at the provider's discretion.      For prescription refill requests, have your pharmacy contact our office and allow 72 hours for refills to be completed.    Today you received the following chemotherapy and/or immunotherapy agents taxol       To help prevent nausea and vomiting after your treatment, we encourage you to take your nausea medication as directed.  BELOW ARE SYMPTOMS THAT SHOULD BE REPORTED IMMEDIATELY: *FEVER GREATER THAN 100.4 F (38 C) OR HIGHER *CHILLS OR SWEATING *NAUSEA AND VOMITING THAT IS NOT CONTROLLED WITH YOUR NAUSEA MEDICATION *UNUSUAL SHORTNESS OF BREATH *UNUSUAL BRUISING OR BLEEDING *URINARY PROBLEMS (pain or burning when urinating, or frequent urination) *BOWEL PROBLEMS (unusual diarrhea, constipation, pain near the anus) TENDERNESS IN MOUTH AND THROAT WITH OR WITHOUT PRESENCE OF ULCERS (sore throat, sores in mouth, or a toothache) UNUSUAL RASH, SWELLING OR PAIN  UNUSUAL VAGINAL DISCHARGE OR ITCHING   Items with * indicate a potential emergency and should be followed up as soon as possible or go to the Emergency Department if any problems should occur.  Please show the CHEMOTHERAPY ALERT CARD or IMMUNOTHERAPY ALERT  CARD at check-in to the Emergency Department and triage nurse.  Should you have questions after your visit or need to cancel or reschedule your appointment, please contact [facility] - A DEPT OF [facility]  [phone] and follow the prompts.  Office hours are 8:00 a.m. to 4:30 p.m. Monday - Friday. Please note that voicemails left after 4:00 p.m. may not be returned until the following business day.  We are closed weekends and major holidays. You have access to a nurse at all times for urgent questions. Please call the main number to the clinic [phone] and follow the prompts.  For any non-urgent questions, you may also contact your provider using MyChart. We now offer e-Visits for anyone 18 and older to request care online for non-urgent symptoms. For details visit [email or web address].   Also download the MyChart app! Go to the app store, search "MyChart", open the app, select [facility], and log in with your MyChart username and password.

## 2023-07-11 NOTE — Telephone Encounter (Signed)
 Clinical Social Worker was contacted by Charity fundraiser to provide patient with information about home communication emergency response system.  Left a vm with contact information for Med Alert [phone].

## 2023-07-12 ENCOUNTER — Encounter: Payer: Self-pay | Admitting: Pharmacist

## 2023-07-12 NOTE — Progress Notes (Signed)
 Called patient to schedule CGM training visit.  Reports day of most recent chemo infusion, her fasting BG was in the 60s. Today, FBG (s/p chemo) = >200 which is normal for her. Prior to chemo, FBG were consistently 70's as previously documented. Plans to increase Ozempic to 0.5 mg next Monday. Advised a slight basal insulin reduction as to prevent further fasting lows.   Decrease Tresiba to 22 units daily (12%??) On the day of chemo infusions, and the following day may use 25 units. All other days, 22 units.  We discussed if she can not remember which dose, it is better to use the lesser (22 units daily regardless of chemo). Once CGM is set up, finer insulin adjustments.  CGM Training office visit scheduled for 07/31/23 at 10:30 am.  Instructed patient to bring all CGM supplies with her to the visit.

## 2023-07-16 ENCOUNTER — Telehealth: Admitting: Student in an Organized Health Care Education/Training Program

## 2023-07-17 ENCOUNTER — Ambulatory Visit: Payer: PPO

## 2023-07-17 ENCOUNTER — Encounter: Payer: Self-pay | Admitting: Oncology

## 2023-07-17 ENCOUNTER — Inpatient Hospital Stay (HOSPITAL_BASED_OUTPATIENT_CLINIC_OR_DEPARTMENT_OTHER): Admitting: Oncology

## 2023-07-17 ENCOUNTER — Inpatient Hospital Stay

## 2023-07-17 VITALS — BP 170/64 | HR 86 | Temp 97.5°F | Resp 19

## 2023-07-17 VITALS — BP 169/59 | HR 66 | Temp 97.2°F | Resp 18 | Ht 62.0 in | Wt 178.0 lb

## 2023-07-17 DIAGNOSIS — Z5111 Encounter for antineoplastic chemotherapy: Secondary | ICD-10-CM | POA: Diagnosis not present

## 2023-07-17 DIAGNOSIS — C541 Malignant neoplasm of endometrium: Secondary | ICD-10-CM

## 2023-07-17 LAB — CBC WITH DIFFERENTIAL (CANCER CENTER ONLY)
Abs Immature Granulocytes: 0.02 10*3/uL (ref 0.00–0.07)
Basophils Absolute: 0 10*3/uL (ref 0.0–0.1)
Basophils Relative: 0 %
Eosinophils Absolute: 0.1 10*3/uL (ref 0.0–0.5)
Eosinophils Relative: 4 %
HCT: 25.6 % — ABNORMAL LOW (ref 36.0–46.0)
Hemoglobin: 8.1 g/dL — ABNORMAL LOW (ref 12.0–15.0)
Immature Granulocytes: 1 %
Lymphocytes Relative: 6 %
Lymphs Abs: 0.2 10*3/uL — ABNORMAL LOW (ref 0.7–4.0)
MCH: 27.9 pg (ref 26.0–34.0)
MCHC: 31.6 g/dL (ref 30.0–36.0)
MCV: 88.3 fL (ref 80.0–100.0)
Monocytes Absolute: 0.1 10*3/uL (ref 0.1–1.0)
Monocytes Relative: 5 %
Neutro Abs: 2.3 10*3/uL (ref 1.7–7.7)
Neutrophils Relative %: 84 %
Platelet Count: 116 10*3/uL — ABNORMAL LOW (ref 150–400)
RBC: 2.9 MIL/uL — ABNORMAL LOW (ref 3.87–5.11)
RDW: 16.9 % — ABNORMAL HIGH (ref 11.5–15.5)
WBC Count: 2.8 10*3/uL — ABNORMAL LOW (ref 4.0–10.5)
nRBC: 0 % (ref 0.0–0.2)

## 2023-07-17 LAB — CMP (CANCER CENTER ONLY)
ALT: 12 U/L (ref 0–44)
AST: 14 U/L — ABNORMAL LOW (ref 15–41)
Albumin: 2.6 g/dL — ABNORMAL LOW (ref 3.5–5.0)
Alkaline Phosphatase: 57 U/L (ref 38–126)
Anion gap: 7 (ref 5–15)
BUN: 20 mg/dL (ref 8–23)
CO2: 24 mmol/L (ref 22–32)
Calcium: 7.8 mg/dL — ABNORMAL LOW (ref 8.9–10.3)
Chloride: 104 mmol/L (ref 98–111)
Creatinine: 0.84 mg/dL (ref 0.44–1.00)
GFR, Estimated: >60 mL/min (ref >60)
Glucose, Bld: 319 mg/dL — ABNORMAL HIGH (ref 70–99)
Potassium: 3.5 mmol/L (ref 3.5–5.1)
Sodium: 135 mmol/L (ref 135–145)
Total Bilirubin: 0.4 mg/dL (ref 0.0–1.2)
Total Protein: 5.3 g/dL — ABNORMAL LOW (ref 6.5–8.1)

## 2023-07-17 MED ORDER — SODIUM CHLORIDE 0.9 % IV SOLN
INTRAVENOUS | Status: DC
  Filled 2023-07-17: qty 250

## 2023-07-17 MED ORDER — HEPARIN SOD (PORK) LOCK FLUSH 100 UNIT/ML IV SOLN
500.0000 [IU] | Freq: Once | INTRAVENOUS | Status: AC | PRN
  Administered 2023-07-17: 500 [IU]
  Filled 2023-07-17: qty 5

## 2023-07-17 MED ORDER — FAMOTIDINE IN NACL 20-0.9 MG/50ML-% IV SOLN
20.0000 mg | Freq: Once | INTRAVENOUS | Status: AC
  Administered 2023-07-17: 20 mg via INTRAVENOUS
  Filled 2023-07-17: qty 50

## 2023-07-17 MED ORDER — DIPHENHYDRAMINE HCL 50 MG/ML IJ SOLN
25.0000 mg | Freq: Once | INTRAMUSCULAR | Status: AC
  Administered 2023-07-17: 25 mg via INTRAVENOUS
  Filled 2023-07-17: qty 1

## 2023-07-17 MED ORDER — DEXAMETHASONE SODIUM PHOSPHATE 10 MG/ML IJ SOLN
10.0000 mg | Freq: Once | INTRAMUSCULAR | Status: AC
  Administered 2023-07-17: 10 mg via INTRAVENOUS
  Filled 2023-07-17: qty 1

## 2023-07-17 MED ORDER — SODIUM CHLORIDE 0.9 % IV SOLN
80.0000 mg/m2 | Freq: Once | INTRAVENOUS | Status: AC
  Administered 2023-07-17: 150 mg via INTRAVENOUS
  Filled 2023-07-17: qty 25

## 2023-07-17 NOTE — Progress Notes (Signed)
 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  INTERVAL HISTORY: Patient returns to clinic today for further evaluation and consideration of cycle 1, day 8 of Taxol.  She tolerated her first treatment well without significant side effects.  Her rash has resolved.  She did not have any falls this past week.  She does not complain of vaginal bleeding today.  She has no neurologic complaints.  She denies any recent fevers or illnesses.  She has no chest pain, shortness of breath, cough, or hemoptysis.  She denies any nausea, vomiting, constipation, or diarrhea.  She has no urinary complaints.  Patient offers no further specific complaints today.  REVIEW OF SYSTEMS:   Review of Systems  Constitutional:  Positive for malaise/fatigue. Negative for fever and weight loss.  Respiratory: Negative.  Negative for cough, hemoptysis and shortness of breath.   Cardiovascular: Negative.  Negative for chest pain and leg swelling.  Gastrointestinal: Negative.  Negative for abdominal pain, blood in stool and melena.  Genitourinary: Negative.  Negative for dysuria.  Musculoskeletal:  Positive for falls. Negative for back pain.  Skin: Negative.  Negative for rash.  Neurological:  Positive for weakness. Negative for focal weakness and headaches.  Psychiatric/Behavioral: Negative.  The patient is not nervous/anxious.     As per HPI. Otherwise, a complete review of systems is negative.  PAST MEDICAL HISTORY: Past Medical History:  Diagnosis Date    Adenomatous colon polyp    Aortic stenosis    Arthritis    Asthma    Cancer (HCC)    Claudication (HCC)    COPD (chronic obstructive pulmonary disease) (HCC)    Depression    Diabetes mellitus without complication (HCC)    Esophageal dysphagia    GERD (gastroesophageal reflux disease)    Hyperlipemia    Hypertension    Obesity    Severe obesity (BMI 35.0-35.9 with comorbidity) (HCC)     PAST SURGICAL HISTORY: Past Surgical History:  Procedure Laterality Date   BLADDER SURGERY     COLONOSCOPY WITH PROPOFOL N/A 08/05/2015   Procedure: COLONOSCOPY WITH PROPOFOL;  Surgeon: Elnita Maxwell, MD;  Location: [facility] ENDOSCOPY;  Service: Endoscopy;  Laterality: N/A;   ESOPHAGOGASTRODUODENOSCOPY N/A 01/09/2022   Procedure: ESOPHAGOGASTRODUODENOSCOPY (EGD);  Surgeon: Regis Bill, MD;  Location: [facility] ENDOSCOPY;  Service: Endoscopy;  Laterality: N/A;   FOOT SURGERY Left    FRACTURE SURGERY     IR CV LINE INJECTION  06/22/2022   IR IMAGING GUIDED PORT INSERTION  06/05/2022   IR PORT REPAIR CENTRAL VENOUS ACCESS DEVICE  07/02/2022   LOWER EXTREMITY ANGIOGRAPHY Left 02/04/2023   Procedure: Lower Extremity Angiography;  Surgeon: Annice Needy, MD;  Location: [facility] INVASIVE CV LAB;  Service: Cardiovascular;  Laterality: Left;   ORIF ANKLE FRACTURE Left 03/26/2017   Procedure: OPEN REDUCTION INTERNAL FIXATION (ORIF) ANKLE FRACTURE;  Surgeon: Christena Flake, MD;  Location: [facility] ORS;  Service: Orthopedics;  Laterality: Left;    FAMILY HISTORY: Family History  Problem Relation Age of Onset   CVA Mother    Diabetes Mother    CAD Father    Breast cancer Neg  Hx     ADVANCED DIRECTIVES (Y/N):  N  HEALTH MAINTENANCE: Social History   Tobacco Use   Smoking status: Former    Current packs/day: 1.00    Average packs/day: 1 pack/day for 40.0 years (40.0 ttl pk-yrs)    Types: Cigarettes   Smokeless tobacco: Never   Tobacco comments:    Quite 2015  Vaping Use   Vaping status: Never Used   Substance Use Topics   Alcohol use: No   Drug use: No     Colonoscopy:  PAP:  Bone density:  Lipid panel:  No Known Allergies  Current Outpatient Medications  Medication Sig Dispense Refill   albuterol (VENTOLIN HFA) 108 (90 Base) MCG/ACT inhaler Inhale 2 puffs into the lungs every 6 (six) hours as needed for wheezing or shortness of breath. 1 each 1   amLODipine (NORVASC) 10 MG tablet Take 10 mg by mouth daily.     aspirin EC 81 MG tablet Take 1 tablet (81 mg total) by mouth daily. Swallow whole. 150 tablet 2   atorvastatin (LIPITOR) 80 MG tablet Take 80 mg by mouth daily.     buPROPion (WELLBUTRIN XL) 300 MG 24 hr tablet TAKE 1 TABLET BY MOUTH DAILY 90 tablet 3   Cholecalciferol (VITAMIN D3) 50 MCG (2000 UT) capsule Take 2,000 Units by mouth daily.     citalopram (CELEXA) 20 MG tablet Take 20 mg by mouth daily.     cloNIDine (CATAPRES) 0.2 MG tablet Take 0.2 mg by mouth 2 (two) times daily.     clopidogrel (PLAVIX) 75 MG tablet Take 1 tablet (75 mg total) by mouth daily. 90 tablet 3   Continuous Glucose Receiver (FREESTYLE LIBRE 3 READER) DEVI Use to check blood glucose continuously. Dx: E11.8, Z79.4 1 each 0   Continuous Glucose Sensor (FREESTYLE LIBRE 3 PLUS SENSOR) MISC Change sensor every 15 days. E11.8, Z79.4. 6 each 3   cyanocobalamin (VITAMIN B12) 1000 MCG tablet Take 1 tablet (1,000 mcg total) by mouth daily. 90 tablet 3   fluticasone-salmeterol (WIXELA INHUB) 250-50 MCG/ACT AEPB Inhale 1 puff into the lungs in the morning and at bedtime. 180 each 6   glipiZIDE (GLUCOTROL XL) 5 MG 24 hr tablet Take 1 tablet (5 mg total) by mouth daily. 30 tablet 1   insulin aspart (NOVOLOG) 100 UNIT/ML injection Inject 7 Units into the skin 3 (three) times daily before meals.     insulin degludec (TRESIBA FLEXTOUCH) 100 UNIT/ML FlexTouch Pen Inject 25 Units into the skin daily. 25 units once daily - REPLACES Levemir     Iron, Ferrous Sulfate, 325 (65 Fe) MG TABS Take 325 mg by mouth every  other day. 90 tablet 3   losartan-hydrochlorothiazide (HYZAAR) 100-25 MG tablet Take 1 tablet by mouth daily.     megestrol (MEGACE) 40 MG tablet Take 1 tablet (40 mg total) by mouth daily. 30 tablet 2   metoprolol succinate (TOPROL-XL) 50 MG 24 hr tablet Take 1 tablet by mouth daily.     montelukast (SINGULAIR) 10 MG tablet Take 10 mg by mouth at bedtime.     ondansetron (ZOFRAN) 8 MG tablet Take 1 tablet (8 mg total) by mouth every 8 (eight) hours as needed for nausea or vomiting. 30 tablet 0   ONETOUCH VERIO test strip 1 each by Other route 3 (three) times daily. 100 each 10   Semaglutide,0.25 or 0.5MG /DOS, 2 MG/3ML SOPN Inject 0.5 mg into the skin once a week. 3 mL 3   No current facility-administered  medications for this visit.   Facility-Administered Medications Ordered in Other Visits  Medication Dose Route Frequency Provider Last Rate Last Admin   0.9 %  sodium chloride infusion   Intravenous Continuous Jeralyn Ruths, MD 10 mL/hr at 07/17/23 1053 New Bag at 07/17/23 1053   PACLitaxel (TAXOL) 150 mg in sodium chloride 0.9 % 250 mL chemo infusion (</= 80mg /m2)  80 mg/m2 (Treatment Plan Recorded) Intravenous Once Jeralyn Ruths, MD        OBJECTIVE: Vitals:   07/17/23 0949  BP: (!) 169/59  Pulse: 66  Resp: 18  Temp: (!) 97.2 F (36.2 C)  SpO2: 100%      Body mass index is 32.56 kg/m.    ECOG FS:1 - Symptomatic but completely ambulatory  General: Well-developed, well-nourished, no acute distress. Eyes: Pink conjunctiva, anicteric sclera. HEENT: Normocephalic, moist mucous membranes. Lungs: No audible wheezing or coughing. Heart: Regular rate and rhythm. Abdomen: Soft, nontender, no obvious distention. Musculoskeletal: No edema, cyanosis, or clubbing. Neuro: Alert, answering all questions appropriately. Cranial nerves grossly intact. Skin: No rashes or petechiae noted. Psych: Normal affect.  LAB RESULTS:  Lab Results  Component Value Date   NA 135 07/17/2023    K 3.5 07/17/2023   CL 104 07/17/2023   CO2 24 07/17/2023   GLUCOSE 319 (H) 07/17/2023   BUN 20 07/17/2023   CREATININE 0.84 07/17/2023   CALCIUM 7.8 (L) 07/17/2023   PROT 5.3 (L) 07/17/2023   ALBUMIN 2.6 (L) 07/17/2023   AST 14 (L) 07/17/2023   ALT 12 07/17/2023   ALKPHOS 57 07/17/2023   BILITOT 0.4 07/17/2023   GFRNONAA >60 07/17/2023   GFRAA >60 03/27/2017    Lab Results  Component Value Date   WBC 2.8 (L) 07/17/2023   NEUTROABS 2.3 07/17/2023   HGB 8.1 (L) 07/17/2023   HCT 25.6 (L) 07/17/2023   MCV 88.3 07/17/2023   PLT 116 (L) 07/17/2023     STUDIES: VAS [place] ABI WITH/WO TBI Result Date: 06/20/2023  LOWER EXTREMITY DOPPLER STUDY Patient Name:  LISSETE MAESTAS  Date of Exam:   06/18/2023 Medical Rec #: [id number]    Accession #:    [id number] Date of Birth: August 18, 1950    Patient Gender: F Patient Age:   5 years Exam Location:  [facility] Procedure:      VAS [place] ABI WITH/WO TBI Referring Phys: [name] DEW --------------------------------------------------------------------------------  Indications: Claudication, and peripheral artery disease. High Risk Factors: Hypertension, hyperlipidemia, past history of smoking. Other Factors: Over 10 years bilateral calf claudication.   Vascular Interventions: 02/24/2023: Aortogram and Selective Left Lower Extremity                         Angiogram. PTA of the Left SFA with 5 mm diameter by 15                         cm Length Lutonix drug coated angioplasty balloon. Stent                         placement to the Left SFA with 6 mm diameter by 12 cm                         Length Life stent. PTA of the Left EIA and Proximal CFA  with 6 mm diameter by 12 cm length Lutonix drug coated                         angioplasty. Stent placement to the Right CIA with 9 mm                         diameter by 38 mm length Lifestream Stent. Performing Technologist: Hardie Lora RVT  Examination Guidelines: A complete evaluation  includes at minimum, Doppler waveform signals and systolic blood pressure reading at the level of bilateral brachial, anterior tibial, and posterior tibial arteries, when vessel segments are accessible. Bilateral testing is considered an integral part of a complete examination. Photoelectric Plethysmograph (PPG) waveforms and toe systolic pressure readings are included as required and additional duplex testing as needed. Limited examinations for reoccurring indications may be performed as noted.  ABI Findings: +---------+------------------+-----+---------+--------+ Right    Rt Pressure (mmHg)IndexWaveform Comment  +---------+------------------+-----+---------+--------+ Brachial 160                                      +---------+------------------+-----+---------+--------+ PTA      150               0.94 triphasic         +---------+------------------+-----+---------+--------+ DP       164               1.02 triphasic         +---------+------------------+-----+---------+--------+ Great Toe122               0.76                   +---------+------------------+-----+---------+--------+ +---------+------------------+-----+---------+-------+ Left     Lt Pressure (mmHg)IndexWaveform Comment +---------+------------------+-----+---------+-------+ Brachial 154                                     +---------+------------------+-----+---------+-------+ PTA      175               1.09 triphasic        +---------+------------------+-----+---------+-------+ DP       183               1.14 triphasic        +---------+------------------+-----+---------+-------+ Randie Heinz Toe134               0.84                  +---------+------------------+-----+---------+-------+ +-------+-----------+-----------+------------+------------+ ABI/TBIToday's ABIToday's TBIPrevious ABIPrevious TBI +-------+-----------+-----------+------------+------------+ Right  1.02       0.76        0.87        1.00         +-------+-----------+-----------+------------+------------+ Left   1.09       0.84       0.94        0.82         +-------+-----------+-----------+------------+------------+ Right ABIs appear increased compared to prior study on 03/04/2023. Left ABIs appear essentially unchanged compared to prior study on 03/04/2023.  Summary: Right: Resting right ankle-brachial index is within normal range. The right toe-brachial index is normal. Left: Resting left ankle-brachial index is within normal range. The left toe-brachial index is normal. *See table(s) above for measurements and observations.  Electronically signed by  Festus Barren MD on 06/20/2023 at 10:02:56 AM.    Final    NM Cardiac Muga Rest Result Date: 06/18/2023 CLINICAL DATA:  Evaluate cardiac function. Patient undergoing chemotherapy for endometrial cancer. EXAM: NUCLEAR MEDICINE CARDIAC BLOOD POOL IMAGING (MUGA) TECHNIQUE: Cardiac multi-gated acquisition was performed at rest following intravenous injection of Tc-6m labeled red blood cells. RADIOPHARMACEUTICALS:  21.27 mCi Tc-42m pertechnetate in-vitro labeled red blood cells IV COMPARISON:  Prior study 04/09/2023 FINDINGS: Normal left ventricular wall motion. No akinetic or dyskinetic segments. The left ventricular ejection fraction is calculated at 70.8%. This was 66.3% on the prior study. Again noted is diffuse uptake in the spleen. IMPRESSION: Left ventricular ejection fraction 70.8%. Electronically Signed   By: Rudie Meyer M.D.   On: 06/18/2023 10:17     ONCOLOGY HISTORY: Patient was initially hesitant to undergo chemotherapy and elected to do XRT only which was completed on April 18, 2022.  CT scan results from October 08, 2022 reviewed independently with no obvious evidence of progressive disease. She completed 6 cycles of carboplatinum, Taxol, and Keytruda on October 03, 2022.  Patient then initiated maintenance Keytruda on October 23, 2022.  PET scan results from  January 15, 2023 reviewed independently with mild nonspecific residual hypermetabolism in the lower uterine segment as well as small bilateral common iliac nodes possibly suggesting nodal metastasis.  Patient was seen by gynecology oncology who determined surgical intervention is not an option.  They recommend discontinuing Keytruda for progressive disease and initiating single agent Doxil every 28 days.  Patient received 3 cycles of Doxil before ration of disease.  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  MUGA on June 17, 2023 revealed an EF of 70.8%.  PET scan results from June 14, 2023 reviewed independently and reported as above with significant progression of disease.  Case discussed with gynecology oncology.  Will discontinue Doxil.  Patient will now receive single agent Taxol on days 1, 8, and 15 with day 22 off.  Will reimage after 3 cycles.  Can also consider lenvatinib or possibly gemcitabine in the future.  Proceed with cycle 1, day 8 of treatment today.  Return to clinic in 1 week for further evaluation and consideration of cycle 1, day 15.  Port: Port revision successful.  Proceed with treatment as above. Hyperglycemia: Patient reports a change in her diet that may be causing her elevated blood glucose.  I have forwarded results to primary care for further evaluation and assist in management. Leukopenia: Mild, monitor.  Proceed with treatment as above. Anemia: Hemoglobin has trended down to 8.1.  Monitor.  Previously, gynecology oncology cauterized several areas of bleeding.   Thrombocytopenia: Platelet count has dropped to 116, monitor. Vaginal bleeding: Patient does not complain of this today.  Secondary to progressive disease.  Follow-up with gynecology oncology as scheduled. Claudication symptoms: Resolved.  Patient underwent  revascularization procedure on February 04, 2023. Right hamstring tendon rupture: Patient reports there is no plan for surgical repair. Rash: Resolved.  Likely secondary to Doxil which has been discontinued.   Poor appetite/weight loss: Improved.  Continue Megace as prescribed.  Consider dietary referral in the future.  Patient expressed understanding and was in agreement with this plan. She also understands that She can call clinic at any time with any questions, concerns, or complaints.    Cancer Staging  Endometrial cancer [facility]) Staging form: Corpus Uteri - Carcinoma  and Carcinosarcoma, AJCC 8th Edition - Clinical stage from 05/30/2022: FIGO Stage IIIB (cT3b, cN0, cM0) - Signed by Jeralyn Ruths, MD on 05/30/2022 Stage prefix: Initial diagnosis   Jeralyn Ruths, MD   07/17/2023 11:28 AM

## 2023-07-18 ENCOUNTER — Encounter: Payer: Self-pay | Admitting: Oncology

## 2023-07-19 ENCOUNTER — Other Ambulatory Visit: Payer: Self-pay | Admitting: Family Medicine

## 2023-07-19 DIAGNOSIS — E118 Type 2 diabetes mellitus with unspecified complications: Secondary | ICD-10-CM

## 2023-07-23 ENCOUNTER — Inpatient Hospital Stay

## 2023-07-23 ENCOUNTER — Telehealth: Payer: Self-pay

## 2023-07-23 ENCOUNTER — Inpatient Hospital Stay (HOSPITAL_BASED_OUTPATIENT_CLINIC_OR_DEPARTMENT_OTHER): Admitting: Oncology

## 2023-07-23 ENCOUNTER — Encounter: Payer: Self-pay | Admitting: Oncology

## 2023-07-23 VITALS — BP 154/104 | HR 66 | Temp 98.6°F | Resp 18 | Ht 62.0 in | Wt 167.7 lb

## 2023-07-23 VITALS — BP 148/58 | HR 67 | Temp 96.6°F | Resp 19

## 2023-07-23 DIAGNOSIS — C541 Malignant neoplasm of endometrium: Secondary | ICD-10-CM

## 2023-07-23 DIAGNOSIS — Z5111 Encounter for antineoplastic chemotherapy: Secondary | ICD-10-CM | POA: Diagnosis not present

## 2023-07-23 LAB — CMP (CANCER CENTER ONLY)
ALT: 11 U/L (ref 0–44)
AST: 12 U/L — ABNORMAL LOW (ref 15–41)
Albumin: 2.7 g/dL — ABNORMAL LOW (ref 3.5–5.0)
Alkaline Phosphatase: 56 U/L (ref 38–126)
Anion gap: 8 (ref 5–15)
BUN: 19 mg/dL (ref 8–23)
CO2: 26 mmol/L (ref 22–32)
Calcium: 8.2 mg/dL — ABNORMAL LOW (ref 8.9–10.3)
Chloride: 105 mmol/L (ref 98–111)
Creatinine: 0.96 mg/dL (ref 0.44–1.00)
GFR, Estimated: >60 mL/min (ref >60)
Glucose, Bld: 218 mg/dL — ABNORMAL HIGH (ref 70–99)
Potassium: 3.5 mmol/L (ref 3.5–5.1)
Sodium: 139 mmol/L (ref 135–145)
Total Bilirubin: 0.5 mg/dL (ref 0.0–1.2)
Total Protein: 5.6 g/dL — ABNORMAL LOW (ref 6.5–8.1)

## 2023-07-23 LAB — CBC WITH DIFFERENTIAL (CANCER CENTER ONLY)
Abs Immature Granulocytes: 0.01 10*3/uL (ref 0.00–0.07)
Basophils Absolute: 0 10*3/uL (ref 0.0–0.1)
Basophils Relative: 1 %
Eosinophils Absolute: 0.1 10*3/uL (ref 0.0–0.5)
Eosinophils Relative: 7 %
HCT: 24.6 % — ABNORMAL LOW (ref 36.0–46.0)
Hemoglobin: 7.8 g/dL — ABNORMAL LOW (ref 12.0–15.0)
Immature Granulocytes: 1 %
Lymphocytes Relative: 10 %
Lymphs Abs: 0.2 10*3/uL — ABNORMAL LOW (ref 0.7–4.0)
MCH: 27.5 pg (ref 26.0–34.0)
MCHC: 31.7 g/dL (ref 30.0–36.0)
MCV: 86.6 fL (ref 80.0–100.0)
Monocytes Absolute: 0.1 10*3/uL (ref 0.1–1.0)
Monocytes Relative: 7 %
Neutro Abs: 1.3 10*3/uL — ABNORMAL LOW (ref 1.7–7.7)
Neutrophils Relative %: 74 %
Platelet Count: 149 10*3/uL — ABNORMAL LOW (ref 150–400)
RBC: 2.84 MIL/uL — ABNORMAL LOW (ref 3.87–5.11)
RDW: 16.7 % — ABNORMAL HIGH (ref 11.5–15.5)
WBC Count: 1.7 10*3/uL — ABNORMAL LOW (ref 4.0–10.5)
nRBC: 0 % (ref 0.0–0.2)

## 2023-07-23 LAB — ABO/RH: ABO/RH(D): A POS

## 2023-07-23 MED ORDER — DIPHENHYDRAMINE HCL 50 MG/ML IJ SOLN
25.0000 mg | Freq: Once | INTRAMUSCULAR | Status: AC
Start: 2023-07-23 — End: 2023-07-23
  Administered 2023-07-23: 25 mg via INTRAVENOUS
  Filled 2023-07-23: qty 1

## 2023-07-23 MED ORDER — SODIUM CHLORIDE 0.9 % IV SOLN
80.0000 mg/m2 | Freq: Once | INTRAVENOUS | Status: AC
  Administered 2023-07-23: 150 mg via INTRAVENOUS
  Filled 2023-07-23: qty 25

## 2023-07-23 MED ORDER — DEXAMETHASONE SODIUM PHOSPHATE 10 MG/ML IJ SOLN
10.0000 mg | Freq: Once | INTRAMUSCULAR | Status: AC
  Administered 2023-07-23: 10 mg via INTRAVENOUS
  Filled 2023-07-23: qty 1

## 2023-07-23 MED ORDER — HEPARIN SOD (PORK) LOCK FLUSH 100 UNIT/ML IV SOLN
500.0000 [IU] | Freq: Once | INTRAVENOUS | Status: AC | PRN
  Administered 2023-07-23: 500 [IU]
  Filled 2023-07-23: qty 5

## 2023-07-23 MED ORDER — SODIUM CHLORIDE 0.9 % IV SOLN
INTRAVENOUS | Status: DC
  Filled 2023-07-23: qty 250

## 2023-07-23 MED ORDER — FAMOTIDINE IN NACL 20-0.9 MG/50ML-% IV SOLN
20.0000 mg | Freq: Once | INTRAVENOUS | Status: AC
  Administered 2023-07-23: 20 mg via INTRAVENOUS
  Filled 2023-07-23: qty 50

## 2023-07-23 NOTE — Telephone Encounter (Signed)
 Please call the office [phone] if you have any questions.   Patient assistance meds are available for pick up      Medication: Ozempic 1 MG4 MG/3 ML   QTY: 2 boxes      1 bag have been labeled and placed in designated pick up area  We have limited space for medication please pick up at your earliest convenience.   Aleatha Borer, CMA

## 2023-07-23 NOTE — Telephone Encounter (Signed)
 Please call the office [phone] if you have any questions.   Patient assistance meds are available for pick up      Medication: Ozempic .25/.50 2MG /   QTY: 2 boxes      1 bag have been labeled and placed in designated pick up area  We have limited space for medication please pick up at your earliest convenience.   Aleatha Borer, CMA

## 2023-07-23 NOTE — Telephone Encounter (Signed)
 Called and spoke to the pt and she advised that she would have someone come pick it up for her.    Patient assistance meds are available for pick up      Medication: NovoLog FlexPen  5 X Prefilled pen   QTY: 3 Box      1 bag have been labeled and placed in designated pick up area  We have limited space for medication please pick up at your earliest convenience.   Aleatha Borer, CMA

## 2023-07-23 NOTE — Progress Notes (Signed)
 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  INTERVAL HISTORY: Patient returns to clinic today for further evaluation and consideration of cycle 1, day 15 of Taxol.  She is tolerating her treatments well without significant side effects.  She denies any falls this past week.  She does not complain of vaginal bleeding today.  She has no neurologic complaints.  She denies any recent fevers or illnesses.  She has no chest pain, shortness of breath, cough, or hemoptysis.  She denies any nausea, vomiting, constipation, or diarrhea.  She has no urinary complaints.  Patient offers no further specific complaints today.  REVIEW OF SYSTEMS:   Review of Systems  Constitutional:  Positive for malaise/fatigue. Negative for fever and weight loss.  Respiratory: Negative.  Negative for cough, hemoptysis and shortness of breath.   Cardiovascular: Negative.  Negative for chest pain and leg swelling.  Gastrointestinal: Negative.  Negative for abdominal pain, blood in stool and melena.  Genitourinary: Negative.  Negative for dysuria.  Musculoskeletal: Negative.  Negative for back pain and falls.  Skin: Negative.  Negative for rash.  Neurological:  Positive for weakness. Negative for focal weakness and headaches.  Psychiatric/Behavioral: Negative.  The patient is not nervous/anxious.     As per HPI. Otherwise, a complete review of systems is negative.  PAST MEDICAL HISTORY: Past Medical History:  Diagnosis Date   Adenomatous colon polyp    Aortic  stenosis    Arthritis    Asthma    Cancer (HCC)    Claudication (HCC)    COPD (chronic obstructive pulmonary disease) (HCC)    Depression    Diabetes mellitus without complication (HCC)    Esophageal dysphagia    GERD (gastroesophageal reflux disease)    Hyperlipemia    Hypertension    Obesity    Severe obesity (BMI 35.0-35.9 with comorbidity) (HCC)     PAST SURGICAL HISTORY: Past Surgical History:  Procedure Laterality Date   BLADDER SURGERY     COLONOSCOPY WITH PROPOFOL N/A 08/05/2015   Procedure: COLONOSCOPY WITH PROPOFOL;  Surgeon: Elnita Maxwell, MD;  Location: [facility] ENDOSCOPY;  Service: Endoscopy;  Laterality: N/A;   ESOPHAGOGASTRODUODENOSCOPY N/A 01/09/2022   Procedure: ESOPHAGOGASTRODUODENOSCOPY (EGD);  Surgeon: Regis Bill, MD;  Location[facility] ENDOSCOPY;  Service: Endoscopy;  Laterality: N/A;   FOOT SURGERY Left    FRACTURE SURGERY     IR CV LINE INJECTION  06/22/2022   IR IMAGING GUIDED PORT INSERTION  06/05/2022   IR PORT REPAIR CENTRAL VENOUS ACCESS DEVICE  07/02/2022   LOWER EXTREMITY ANGIOGRAPHY Left 02/04/2023   Procedure: Lower Extremity Angiography;  Surgeon: Annice Needy, MD;  Location: [facility] INVASIVE CV LAB;  Service: Cardiovascular;  Laterality: Left;   ORIF ANKLE FRACTURE Left 03/26/2017   Procedure: OPEN REDUCTION INTERNAL FIXATION (ORIF) ANKLE FRACTURE;  Surgeon: Christena Flake, MD;  Location: [facility] ORS;  Service: Orthopedics;  Laterality: Left;    FAMILY HISTORY: Family History  Problem Relation Age of Onset   CVA Mother    Diabetes Mother    CAD Father    Breast cancer Neg Hx     ADVANCED DIRECTIVES (  Y/N):  N  HEALTH MAINTENANCE: Social History   Tobacco Use   Smoking status: Former    Current packs/day: 1.00    Average packs/day: 1 pack/day for 40.0 years (40.0 ttl pk-yrs)    Types: Cigarettes   Smokeless tobacco: Never   Tobacco comments:    Quite 2015  Vaping Use   Vaping status: Never Used  Substance Use Topics   Alcohol use:  No   Drug use: No     Colonoscopy:  PAP:  Bone density:  Lipid panel:  No Known Allergies  Current Outpatient Medications  Medication Sig Dispense Refill   albuterol (VENTOLIN HFA) 108 (90 Base) MCG/ACT inhaler Inhale 2 puffs into the lungs every 6 (six) hours as needed for wheezing or shortness of breath. 1 each 1   amLODipine (NORVASC) 10 MG tablet Take 10 mg by mouth daily.     aspirin EC 81 MG tablet Take 1 tablet (81 mg total) by mouth daily. Swallow whole. 150 tablet 2   atorvastatin (LIPITOR) 80 MG tablet Take 80 mg by mouth daily.     buPROPion (WELLBUTRIN XL) 300 MG 24 hr tablet TAKE 1 TABLET BY MOUTH DAILY 90 tablet 3   Cholecalciferol (VITAMIN D3) 50 MCG (2000 UT) capsule Take 2,000 Units by mouth daily.     citalopram (CELEXA) 20 MG tablet Take 20 mg by mouth daily.     cloNIDine (CATAPRES) 0.2 MG tablet Take 0.2 mg by mouth 2 (two) times daily.     clopidogrel (PLAVIX) 75 MG tablet Take 1 tablet (75 mg total) by mouth daily. 90 tablet 3   Continuous Glucose Receiver (FREESTYLE LIBRE 3 READER) DEVI Use to check blood glucose continuously. Dx: E11.8, Z79.4 1 each 0   Continuous Glucose Sensor (FREESTYLE LIBRE 3 PLUS SENSOR) MISC Change sensor every 15 days. E11.8, Z79.4. 6 each 3   cyanocobalamin (VITAMIN B12) 1000 MCG tablet Take 1 tablet (1,000 mcg total) by mouth daily. 90 tablet 3   fluticasone-salmeterol (WIXELA INHUB) 250-50 MCG/ACT AEPB Inhale 1 puff into the lungs in the morning and at bedtime. 180 each 6   glipiZIDE (GLUCOTROL XL) 5 MG 24 hr tablet TAKE 1 TABLET BY MOUTH DAILY 30 tablet 1   insulin aspart (NOVOLOG) 100 UNIT/ML injection Inject 7 Units into the skin 3 (three) times daily before meals.     insulin degludec (TRESIBA FLEXTOUCH) 100 UNIT/ML FlexTouch Pen Inject 25 Units into the skin daily. 25 units once daily - REPLACES Levemir     Iron, Ferrous Sulfate, 325 (65 Fe) MG TABS Take 325 mg by mouth every other day. 90 tablet 3   losartan-hydrochlorothiazide  (HYZAAR) 100-25 MG tablet Take 1 tablet by mouth daily.     megestrol (MEGACE) 40 MG tablet Take 1 tablet (40 mg total) by mouth daily. 30 tablet 2   metoprolol succinate (TOPROL-XL) 50 MG 24 hr tablet Take 1 tablet by mouth daily.     montelukast (SINGULAIR) 10 MG tablet Take 10 mg by mouth at bedtime.     ondansetron (ZOFRAN) 8 MG tablet Take 1 tablet (8 mg total) by mouth every 8 (eight) hours as needed for nausea or vomiting. 30 tablet 0   ONETOUCH VERIO test strip 1 each by Other route 3 (three) times daily. 100 each 10   Semaglutide,0.25 or 0.5MG /DOS, 2 MG/3ML SOPN Inject 0.5 mg into the skin once a week. 3 mL 3   No current facility-administered medications for this visit.   Facility-Administered Medications Ordered in  Other Visits  Medication Dose Route Frequency Provider Last Rate Last Admin   0.9 %  sodium chloride infusion   Intravenous Continuous Jeralyn Ruths, MD 10 mL/hr at 07/23/23 0955 New Bag at 07/23/23 0955   dexamethasone (DECADRON) injection 10 mg  10 mg Intravenous Once Jeralyn Ruths, MD       famotidine (PEPCID) IVPB 20 mg premix  20 mg Intravenous Once Jeralyn Ruths, MD 200 mL/hr at 07/23/23 0958 20 mg at 07/23/23 0958   PACLitaxel (TAXOL) 150 mg in sodium chloride 0.9 % 250 mL chemo infusion (</= 80mg /m2)  80 mg/m2 (Treatment Plan Recorded) Intravenous Once Jeralyn Ruths, MD        OBJECTIVE: Vitals:   07/23/23 0844  BP: (!) 154/104  Pulse: 66  Resp: 18  Temp: 98.6 F (37 C)  SpO2: 100%       Body mass index is 30.67 kg/m.    ECOG FS:1 - Symptomatic but completely ambulatory  General: Well-developed, well-nourished, no acute distress. Eyes: Pink conjunctiva, anicteric sclera. HEENT: Normocephalic, moist mucous membranes. Lungs: No audible wheezing or coughing. Heart: Regular rate and rhythm. Abdomen: Soft, nontender, no obvious distention. Musculoskeletal: No edema, cyanosis, or clubbing. Neuro: Alert, answering all questions  appropriately. Cranial nerves grossly intact. Skin: No rashes or petechiae noted. Psych: Normal affect.  LAB RESULTS:  Lab Results  Component Value Date   NA 139 07/23/2023   K 3.5 07/23/2023   CL 105 07/23/2023   CO2 26 07/23/2023   GLUCOSE 218 (H) 07/23/2023   BUN 19 07/23/2023   CREATININE 0.96 07/23/2023   CALCIUM 8.2 (L) 07/23/2023   PROT 5.6 (L) 07/23/2023   ALBUMIN 2.7 (L) 07/23/2023   AST 12 (L) 07/23/2023   ALT 11 07/23/2023   ALKPHOS 56 07/23/2023   BILITOT 0.5 07/23/2023   GFRNONAA >60 07/23/2023   GFRAA >60 03/27/2017    Lab Results  Component Value Date   WBC 1.7 (L) 07/23/2023   NEUTROABS 1.3 (L) 07/23/2023   HGB 7.8 (L) 07/23/2023   HCT 24.6 (L) 07/23/2023   MCV 86.6 07/23/2023   PLT 149 (L) 07/23/2023     STUDIES: No results found.    ONCOLOGY HISTORY: Patient was initially hesitant to undergo chemotherapy and elected to do XRT only which was completed on April 18, 2022.  CT scan results from October 08, 2022 reviewed independently with no obvious evidence of progressive disease. She completed 6 cycles of carboplatinum, Taxol, and Keytruda on October 03, 2022.  Patient then initiated maintenance Keytruda on October 23, 2022.  PET scan results from January 15, 2023 reviewed independently with mild nonspecific residual hypermetabolism in the lower uterine segment as well as small bilateral common iliac nodes possibly suggesting nodal metastasis.  Patient was seen by gynecology oncology who determined surgical intervention is not an option.  They recommend discontinuing Keytruda for progressive disease and initiating single agent Doxil every 28 days.  Patient received 3 cycles of Doxil before ration of disease.  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  MUGA on June 17, 2023 revealed an EF of 70.8%.  PET  scan results from June 14, 2023 reviewed independently and reported as above with significant progression of disease.  Case discussed with gynecology oncology.  Will discontinue Doxil.  Patient will now receive single agent Taxol on days 1, 8, and 15 with day 22  off.  Will reimage after 3 cycles.  Can also consider lenvatinib or possibly gemcitabine in the future.  Proceed with cycle 1, day 15 of treatment today.  Return to clinic tomorrow for blood transfusion and then in 2 weeks for further evaluation and consideration of cycle 2, day 1. Port: Port revision successful.  Proceed with treatment as above. Hyperglycemia: Chronic and unchanged.  Patient reports a change in her diet that may be causing her elevated blood glucose.  Continue evaluation and treatment per primary care.   Neutropenia: Patient's ANC is 1.3 today.  Proceed cautiously with treatment as above. Anemia: Hemoglobin has declined to 7.8.  Proceed with treatment as above.  Return to clinic tomorrow for 1 unit of packed red blood cells.  Previously, gynecology oncology cauterized several areas of bleeding.   Thrombocytopenia: Platelet count up to 149, monitor. Vaginal bleeding: Patient does not complain of this today.  Secondary to progressive disease.  Follow-up with gynecology oncology as scheduled. Claudication symptoms: Resolved.  Patient underwent revascularization procedure on February 04, 2023. Right hamstring tendon rupture: Patient reports there is no plan for surgical repair. Rash: Resolved.  Likely secondary to Doxil which has been discontinued.   Poor appetite/weight loss: Improved.  Continue Megace as prescribed.  Consider dietary referral in the future.  Patient expressed understanding and was in agreement with this plan. She also understands that She can call clinic at any time with any questions, concerns, or complaints.    Cancer Staging  Endometrial cancer [facility]) Staging form: Corpus Uteri - Carcinoma and  Carcinosarcoma, AJCC 8th Edition - Clinical stage from 05/30/2022: FIGO Stage IIIB (cT3b, cN0, cM0) - Signed by Jeralyn Ruths, MD on 05/30/2022 Stage prefix: Initial diagnosis   Jeralyn Ruths, MD   07/23/2023 10:10 AM

## 2023-07-24 ENCOUNTER — Encounter: Payer: Self-pay | Admitting: Student in an Organized Health Care Education/Training Program

## 2023-07-24 ENCOUNTER — Ambulatory Visit

## 2023-07-24 ENCOUNTER — Ambulatory Visit: Admitting: Oncology

## 2023-07-24 ENCOUNTER — Inpatient Hospital Stay

## 2023-07-24 ENCOUNTER — Ambulatory Visit (INDEPENDENT_AMBULATORY_CARE_PROVIDER_SITE_OTHER): Admitting: Student in an Organized Health Care Education/Training Program

## 2023-07-24 ENCOUNTER — Encounter: Payer: Self-pay | Admitting: Oncology

## 2023-07-24 ENCOUNTER — Other Ambulatory Visit

## 2023-07-24 VITALS — BP 110/62 | HR 81 | Resp 14 | Ht 62.0 in | Wt 169.6 lb

## 2023-07-24 DIAGNOSIS — R911 Solitary pulmonary nodule: Secondary | ICD-10-CM | POA: Diagnosis not present

## 2023-07-24 DIAGNOSIS — J4540 Moderate persistent asthma, uncomplicated: Secondary | ICD-10-CM

## 2023-07-24 MED ORDER — SPIRIVA RESPIMAT 2.5 MCG/ACT IN AERS: 2.0000 | INHALATION_SPRAY | Freq: Every day | RESPIRATORY_TRACT | 11 refills | Status: DC

## 2023-07-24 NOTE — Progress Notes (Unsigned)
 Synopsis: Referred in *** by Dana Allan, MD  Assessment & Plan:   1. Moderate persistent asthma without complication (Primary) Breathing 8/10 improved, PFT's with reversibility. Will add spiriva given smoking history, continue wixela. - Tiotropium Bromide Monohydrate (SPIRIVA RESPIMAT) 2.5 MCG/ACT AERS; Inhale 2 puffs into the lungs daily.  Dispense: 60 each; Refill: 11 - CT CHEST WO CONTRAST; Future  2. Pulmonary nodule  ?tree in bud on recent PET, will evaluate further with a CT  - CT CHEST WO CONTRAST; Future   Return in about 3 months (around 10/24/2023).  I spent *** minutes caring for this patient today, including {EM billing:[id number]}  [name], MD [facility] 07/24/2023 11:54 AM    End of visit medications:  Meds ordered this encounter  Medications   Tiotropium Bromide Monohydrate (SPIRIVA RESPIMAT) 2.5 MCG/ACT AERS    Sig: Inhale 2 puffs into the lungs daily.    Dispense:  60 each    Refill:  11     Current Outpatient Medications:    albuterol (VENTOLIN HFA) 108 (90 Base) MCG/ACT inhaler, Inhale 2 puffs into the lungs every 6 (six) hours as needed for wheezing or shortness of breath., Disp: 1 each, Rfl: 1   amLODipine (NORVASC) 10 MG tablet, Take 10 mg by mouth daily., Disp: , Rfl:    aspirin EC 81 MG tablet, Take 1 tablet (81 mg total) by mouth daily. Swallow whole., Disp: 150 tablet, Rfl: 2   atorvastatin (LIPITOR) 80 MG tablet, Take 80 mg by mouth daily., Disp: , Rfl:    buPROPion (WELLBUTRIN XL) 300 MG 24 hr tablet, TAKE 1 TABLET BY MOUTH DAILY, Disp: 90 tablet, Rfl: 3   Cholecalciferol (VITAMIN D3) 50 MCG (2000 UT) capsule, Take 2,000 Units by mouth daily., Disp: , Rfl:    citalopram (CELEXA) 20 MG tablet, Take 20 mg by mouth daily., Disp: , Rfl:    cloNIDine (CATAPRES) 0.2 MG tablet, Take 0.2 mg by mouth 2 (two) times daily., Disp: , Rfl:    clopidogrel (PLAVIX) 75 MG tablet, Take 1 tablet (75 mg total) by mouth daily., Disp: 90  tablet, Rfl: 3   Continuous Glucose Receiver (FREESTYLE LIBRE 3 READER) DEVI, Use to check blood glucose continuously. Dx: E11.8, Z79.4, Disp: 1 each, Rfl: 0   Continuous Glucose Sensor (FREESTYLE LIBRE 3 PLUS SENSOR) MISC, Change sensor every 15 days. E11.8, Z79.4., Disp: 6 each, Rfl: 3   cyanocobalamin (VITAMIN B12) 1000 MCG tablet, Take 1 tablet (1,000 mcg total) by mouth daily., Disp: 90 tablet, Rfl: 3   fluticasone-salmeterol (WIXELA INHUB) 250-50 MCG/ACT AEPB, Inhale 1 puff into the lungs in the morning and at bedtime., Disp: 180 each, Rfl: 6   glipiZIDE (GLUCOTROL XL) 5 MG 24 hr tablet, TAKE 1 TABLET BY MOUTH DAILY, Disp: 30 tablet, Rfl: 1   insulin aspart (NOVOLOG) 100 UNIT/ML injection, Inject 7 Units into the skin 3 (three) times daily before meals., Disp: , Rfl:    insulin degludec (TRESIBA FLEXTOUCH) 100 UNIT/ML FlexTouch Pen, Inject 25 Units into the skin daily. 25 units once daily - REPLACES Levemir, Disp: , Rfl:    Iron, Ferrous Sulfate, 325 (65 Fe) MG TABS, Take 325 mg by mouth every other day., Disp: 90 tablet, Rfl: 3   losartan-hydrochlorothiazide (HYZAAR) 100-25 MG tablet, Take 1 tablet by mouth daily., Disp: , Rfl:    megestrol (MEGACE) 40 MG tablet, Take 1 tablet (40 mg total) by mouth daily., Disp: 30 tablet, Rfl: 2   metoprolol succinate (TOPROL-XL)  50 MG 24 hr tablet, Take 1 tablet by mouth daily., Disp: , Rfl:    montelukast (SINGULAIR) 10 MG tablet, Take 10 mg by mouth at bedtime., Disp: , Rfl:    ondansetron (ZOFRAN) 8 MG tablet, Take 1 tablet (8 mg total) by mouth every 8 (eight) hours as needed for nausea or vomiting., Disp: 30 tablet, Rfl: 0   ONETOUCH VERIO test strip, 1 each by Other route 3 (three) times daily., Disp: 100 each, Rfl: 10   Semaglutide,0.25 or 0.5MG /DOS, 2 MG/3ML SOPN, Inject 0.5 mg into the skin once a week., Disp: 3 mL, Rfl: 3   Tiotropium Bromide Monohydrate (SPIRIVA RESPIMAT) 2.5 MCG/ACT AERS, Inhale 2 puffs into the lungs daily., Disp: 60 each, Rfl:  11   Subjective:   PATIENT ID: Terri [name] GENDER: female DOB: March 28, 1951, MRN: [id number]  Chief Complaint  Patient presents with   Follow-up    Breathing is ok.     HPI ***  Ancillary information including prior medications, full medical/surgical/family/social histories, and PFTs (when available) are listed below and have been reviewed.   ROS   Objective:   Vitals:   07/24/23 1137  BP: 110/62  Pulse: 81  Resp: 14  SpO2: 99%  Weight: 169 lb 9.6 oz (76.9 kg)  Height: 5\' 2"  (1.575 m)   99% on *** LPM *** RA BMI Readings from Last 3 Encounters:  07/24/23 31.02 kg/m  07/23/23 30.67 kg/m  07/17/23 32.56 kg/m   Wt Readings from Last 3 Encounters:  07/24/23 169 lb 9.6 oz (76.9 kg)  07/23/23 167 lb 11.2 oz (76.1 kg)  07/17/23 178 lb (80.7 kg)    Physical Exam    Ancillary Information    Past Medical History:  Diagnosis Date   Adenomatous colon polyp    Aortic stenosis    Arthritis    Asthma    Cancer (HCC)    Claudication (HCC)    COPD (chronic obstructive pulmonary disease) (HCC)    Depression    Diabetes mellitus without complication (HCC)    Esophageal dysphagia    GERD (gastroesophageal reflux disease)    Hyperlipemia    Hypertension    Obesity    Severe obesity (BMI 35.0-35.9 with comorbidity) (HCC)      Family History  Problem Relation Age of Onset   CVA Mother    Diabetes Mother    CAD Father    Breast cancer Neg Hx      Past Surgical History:  Procedure Laterality Date   BLADDER SURGERY     COLONOSCOPY WITH PROPOFOL N/A 08/05/2015   Procedure: COLONOSCOPY WITH PROPOFOL;  Surgeon: Elnita Maxwell, MD;  Location: [facility] ENDOSCOPY;  Service: Endoscopy;  Laterality: N/A;   ESOPHAGOGASTRODUODENOSCOPY N/A 01/09/2022   Procedure: ESOPHAGOGASTRODUODENOSCOPY (EGD);  Surgeon: Regis Bill, MD;  Location: [facility] ENDOSCOPY;  Service: Endoscopy;  Laterality: N/A;   FOOT SURGERY Left    FRACTURE SURGERY     IR CV LINE INJECTION   06/22/2022   IR IMAGING GUIDED PORT INSERTION  06/05/2022   IR PORT REPAIR CENTRAL VENOUS ACCESS DEVICE  07/02/2022   LOWER EXTREMITY ANGIOGRAPHY Left 02/04/2023   Procedure: Lower Extremity Angiography;  Surgeon: Annice Needy, MD;  Location: [facility] INVASIVE CV LAB;  Service: Cardiovascular;  Laterality: Left;   ORIF ANKLE FRACTURE Left 03/26/2017   Procedure: OPEN REDUCTION INTERNAL FIXATION (ORIF) ANKLE FRACTURE;  Surgeon: Christena Flake, MD;  Location: [facility] ORS;  Service: Orthopedics;  Laterality: Left;    Social History  Socioeconomic History   Marital status: Single    Spouse name: Not on file   Number of children: Not on file   Years of education: Not on file   Highest education level: Not on file  Occupational History   Not on file  Tobacco Use   Smoking status: Former    Current packs/day: 1.00    Average packs/day: 1 pack/day for 40.0 years (40.0 ttl pk-yrs)    Types: Cigarettes   Smokeless tobacco: Never   Tobacco comments:    Quite 2015  Vaping Use   Vaping status: Never Used  Substance and Sexual Activity   Alcohol use: No   Drug use: No   Sexual activity: Not on file  Other Topics Concern   Not on file  Social History Narrative   Lives alone.  Maximino Greenland, here with her today.  Indoor cats.     Social Drivers of Corporate investment banker Strain: Low Risk  (12/20/2022)   Received from [facility]   Overall Financial Resource Strain (CARDIA)    Difficulty of Paying Living Expenses: Not very hard  Food Insecurity: No Food Insecurity (12/20/2022)   Received from [facility]   Hunger Vital Sign    Worried About Running Out of Food in the Last Year: Never true    Ran Out of Food in the Last Year: Never true  Transportation Needs: Unknown (12/20/2022)   Received from [facility] - Transportation    In the past 12 months, has lack of transportation kept you from medical appointments or from getting  medications?: No    Lack of Transportation (Non-Medical): Not on file  Physical Activity: Inactive (09/20/2022)   Exercise Vital Sign    Days of Exercise per Week: 0 days    Minutes of Exercise per Session: 0 min  Stress: Stress Concern Present (09/20/2022)   [employer] of Occupational Health - Occupational Stress Questionnaire    Feeling of Stress : To some extent  Social Connections: Socially Isolated (09/20/2022)   Social Connection and Isolation Panel [NHANES]    Frequency of Communication with Friends and Family: Once a week    Frequency of Social Gatherings with Friends and Family: Never    Attends Religious Services: Never    Database administrator or Organizations: No    Attends Banker Meetings: Never    Marital Status: Never married  Intimate Partner Violence: Not At Risk (05/30/2022)   Humiliation, Afraid, Rape, and Kick questionnaire    Fear of Current or Ex-Partner: No    Emotionally Abused: No    Physically Abused: No    Sexually Abused: No     No Known Allergies   CBC    Component Value Date/Time   WBC 1.7 (L) 07/23/2023 0832   WBC 8.6 03/29/2023 1324   RBC 2.84 (L) 07/23/2023 0832   HGB 7.8 (L) 07/23/2023 0832   HCT 24.6 (L) 07/23/2023 0832   PLT 149 (L) 07/23/2023 0832   MCV 86.6 07/23/2023 0832   MCH 27.5 07/23/2023 0832   MCHC 31.7 07/23/2023 0832   RDW 16.7 (H) 07/23/2023 0832   LYMPHSABS 0.2 (L) 07/23/2023 0832   MONOABS 0.1 07/23/2023 0832   EOSABS 0.1 07/23/2023 0832   BASOSABS 0.0 07/23/2023 0832    Pulmonary Functions Testing Results:    Latest Ref Rng & Units 07/04/2023    1:42 PM  PFT Results  FVC-Pre L 1.48  FVC-Predicted Pre % 55   FVC-Post L 2.14   FVC-Predicted Post % 80   Pre FEV1/FVC % % 74   Post FEV1/FCV % % 67   FEV1-Pre L 1.10   FEV1-Predicted Pre % 55   FEV1-Post L 1.43   TLC L 4.44   TLC % Predicted % 93   RV % Predicted % 139     Outpatient Medications Prior to Visit  Medication Sig Dispense  Refill   albuterol (VENTOLIN HFA) 108 (90 Base) MCG/ACT inhaler Inhale 2 puffs into the lungs every 6 (six) hours as needed for wheezing or shortness of breath. 1 each 1   amLODipine (NORVASC) 10 MG tablet Take 10 mg by mouth daily.     aspirin EC 81 MG tablet Take 1 tablet (81 mg total) by mouth daily. Swallow whole. 150 tablet 2   atorvastatin (LIPITOR) 80 MG tablet Take 80 mg by mouth daily.     buPROPion (WELLBUTRIN XL) 300 MG 24 hr tablet TAKE 1 TABLET BY MOUTH DAILY 90 tablet 3   Cholecalciferol (VITAMIN D3) 50 MCG (2000 UT) capsule Take 2,000 Units by mouth daily.     citalopram (CELEXA) 20 MG tablet Take 20 mg by mouth daily.     cloNIDine (CATAPRES) 0.2 MG tablet Take 0.2 mg by mouth 2 (two) times daily.     clopidogrel (PLAVIX) 75 MG tablet Take 1 tablet (75 mg total) by mouth daily. 90 tablet 3   Continuous Glucose Receiver (FREESTYLE LIBRE 3 READER) DEVI Use to check blood glucose continuously. Dx: E11.8, Z79.4 1 each 0   Continuous Glucose Sensor (FREESTYLE LIBRE 3 PLUS SENSOR) MISC Change sensor every 15 days. E11.8, Z79.4. 6 each 3   cyanocobalamin (VITAMIN B12) 1000 MCG tablet Take 1 tablet (1,000 mcg total) by mouth daily. 90 tablet 3   fluticasone-salmeterol (WIXELA INHUB) 250-50 MCG/ACT AEPB Inhale 1 puff into the lungs in the morning and at bedtime. 180 each 6   glipiZIDE (GLUCOTROL XL) 5 MG 24 hr tablet TAKE 1 TABLET BY MOUTH DAILY 30 tablet 1   insulin aspart (NOVOLOG) 100 UNIT/ML injection Inject 7 Units into the skin 3 (three) times daily before meals.     insulin degludec (TRESIBA FLEXTOUCH) 100 UNIT/ML FlexTouch Pen Inject 25 Units into the skin daily. 25 units once daily - REPLACES Levemir     Iron, Ferrous Sulfate, 325 (65 Fe) MG TABS Take 325 mg by mouth every other day. 90 tablet 3   losartan-hydrochlorothiazide (HYZAAR) 100-25 MG tablet Take 1 tablet by mouth daily.     megestrol (MEGACE) 40 MG tablet Take 1 tablet (40 mg total) by mouth daily. 30 tablet 2    metoprolol succinate (TOPROL-XL) 50 MG 24 hr tablet Take 1 tablet by mouth daily.     montelukast (SINGULAIR) 10 MG tablet Take 10 mg by mouth at bedtime.     ondansetron (ZOFRAN) 8 MG tablet Take 1 tablet (8 mg total) by mouth every 8 (eight) hours as needed for nausea or vomiting. 30 tablet 0   ONETOUCH VERIO test strip 1 each by Other route 3 (three) times daily. 100 each 10   Semaglutide,0.25 or 0.5MG /DOS, 2 MG/3ML SOPN Inject 0.5 mg into the skin once a week. 3 mL 3   No facility-administered medications prior to visit.

## 2023-07-25 ENCOUNTER — Ambulatory Visit

## 2023-07-25 ENCOUNTER — Telehealth: Payer: Self-pay | Admitting: *Deleted

## 2023-07-25 NOTE — Telephone Encounter (Signed)
 I called her number and said to try back later and about 15 min after and I called again and says the same thing. Hopefully she will call back

## 2023-07-26 ENCOUNTER — Other Ambulatory Visit (INDEPENDENT_AMBULATORY_CARE_PROVIDER_SITE_OTHER): Payer: Self-pay | Admitting: Pharmacist

## 2023-07-26 DIAGNOSIS — E118 Type 2 diabetes mellitus with unspecified complications: Secondary | ICD-10-CM

## 2023-07-26 NOTE — Progress Notes (Signed)
 Brief Telephone Documentation Reason for Call: Patient left message regarding question for pharmacist  Summary of Call: Patient reports she is getting a blood transfusion next week. She is not sure if it will interfere with her current appointment scheduled for Wednesday.   Also notes she was prescribed a new inhaler and does not know how to use it (Spiriva). She reports the inhaler was very expensive so she did not intend on continuing it.    Follow Up: Office visit for Wednesday kept on schedule for now. We discussed moving the appointment as needed once her transfusion date is established.  Will plan for Spiriva teaching at Wednesday office visit  Will plan for [facility] application for Spiriva. Pt to sign Wednesday in office.   Loree Fee, PharmD Clinical Pharmacist [facility] [phone]

## 2023-07-27 LAB — BPAM RBC
Blood Product Expiration Date: 202504262359
Unit Type and Rh: 6200

## 2023-07-27 LAB — TYPE AND SCREEN
ABO/RH(D): A POS
Antibody Screen: NEGATIVE
Unit division: 0

## 2023-07-28 LAB — PREPARE RBC (CROSSMATCH)

## 2023-07-29 ENCOUNTER — Telehealth: Payer: Self-pay | Admitting: *Deleted

## 2023-07-29 ENCOUNTER — Encounter: Payer: Self-pay | Admitting: Oncology

## 2023-07-29 NOTE — Telephone Encounter (Signed)
 I see that she has appt 4/8 and it says transportation and labs and see md and get treatment. I called the number and does not able to leave a mesage

## 2023-07-30 ENCOUNTER — Other Ambulatory Visit: Payer: Self-pay | Admitting: *Deleted

## 2023-07-30 DIAGNOSIS — C541 Malignant neoplasm of endometrium: Secondary | ICD-10-CM

## 2023-07-30 DIAGNOSIS — D649 Anemia, unspecified: Secondary | ICD-10-CM

## 2023-07-31 ENCOUNTER — Encounter

## 2023-07-31 ENCOUNTER — Ambulatory Visit: Admitting: Pharmacist

## 2023-07-31 ENCOUNTER — Encounter: Payer: Self-pay | Admitting: Pharmacist

## 2023-07-31 ENCOUNTER — Encounter: Payer: Self-pay | Admitting: Oncology

## 2023-07-31 ENCOUNTER — Other Ambulatory Visit

## 2023-07-31 ENCOUNTER — Inpatient Hospital Stay (HOSPITAL_BASED_OUTPATIENT_CLINIC_OR_DEPARTMENT_OTHER): Admitting: Oncology

## 2023-07-31 ENCOUNTER — Inpatient Hospital Stay: Attending: Obstetrics and Gynecology

## 2023-07-31 ENCOUNTER — Ambulatory Visit

## 2023-07-31 ENCOUNTER — Inpatient Hospital Stay

## 2023-07-31 ENCOUNTER — Ambulatory Visit: Admitting: Oncology

## 2023-07-31 VITALS — BP 139/51 | HR 60 | Temp 97.5°F | Resp 18 | Wt 166.0 lb

## 2023-07-31 DIAGNOSIS — C541 Malignant neoplasm of endometrium: Secondary | ICD-10-CM

## 2023-07-31 DIAGNOSIS — E1165 Type 2 diabetes mellitus with hyperglycemia: Secondary | ICD-10-CM | POA: Diagnosis not present

## 2023-07-31 DIAGNOSIS — D696 Thrombocytopenia, unspecified: Secondary | ICD-10-CM | POA: Diagnosis not present

## 2023-07-31 DIAGNOSIS — D72819 Decreased white blood cell count, unspecified: Secondary | ICD-10-CM | POA: Insufficient documentation

## 2023-07-31 DIAGNOSIS — Z5111 Encounter for antineoplastic chemotherapy: Secondary | ICD-10-CM | POA: Insufficient documentation

## 2023-07-31 DIAGNOSIS — Z794 Long term (current) use of insulin: Secondary | ICD-10-CM

## 2023-07-31 DIAGNOSIS — E118 Type 2 diabetes mellitus with unspecified complications: Secondary | ICD-10-CM | POA: Diagnosis not present

## 2023-07-31 DIAGNOSIS — Z452 Encounter for adjustment and management of vascular access device: Secondary | ICD-10-CM | POA: Insufficient documentation

## 2023-07-31 DIAGNOSIS — G629 Polyneuropathy, unspecified: Secondary | ICD-10-CM | POA: Diagnosis not present

## 2023-07-31 DIAGNOSIS — D649 Anemia, unspecified: Secondary | ICD-10-CM | POA: Diagnosis not present

## 2023-07-31 DIAGNOSIS — N888 Other specified noninflammatory disorders of cervix uteri: Secondary | ICD-10-CM | POA: Diagnosis not present

## 2023-07-31 LAB — SAMPLE TO BLOOD BANK

## 2023-07-31 LAB — CBC WITH DIFFERENTIAL/PLATELET
Abs Immature Granulocytes: 0.04 10*3/uL (ref 0.00–0.07)
Basophils Absolute: 0 10*3/uL (ref 0.0–0.1)
Basophils Relative: 0 %
Eosinophils Absolute: 0 10*3/uL (ref 0.0–0.5)
Eosinophils Relative: 1 %
HCT: 24.7 % — ABNORMAL LOW (ref 36.0–46.0)
Hemoglobin: 7.6 g/dL — ABNORMAL LOW (ref 12.0–15.0)
Immature Granulocytes: 2 %
Lymphocytes Relative: 10 %
Lymphs Abs: 0.3 10*3/uL — ABNORMAL LOW (ref 0.7–4.0)
MCH: 27.6 pg (ref 26.0–34.0)
MCHC: 30.8 g/dL (ref 30.0–36.0)
MCV: 89.8 fL (ref 80.0–100.0)
Monocytes Absolute: 0.4 10*3/uL (ref 0.1–1.0)
Monocytes Relative: 15 %
Neutro Abs: 1.8 10*3/uL (ref 1.7–7.7)
Neutrophils Relative %: 72 %
Platelets: 202 10*3/uL (ref 150–400)
RBC: 2.75 MIL/uL — ABNORMAL LOW (ref 3.87–5.11)
RDW: 17.6 % — ABNORMAL HIGH (ref 11.5–15.5)
WBC: 2.6 10*3/uL — ABNORMAL LOW (ref 4.0–10.5)
nRBC: 1.2 % — ABNORMAL HIGH (ref 0.0–0.2)

## 2023-07-31 LAB — CMP (CANCER CENTER ONLY)
ALT: 13 U/L (ref 0–44)
AST: 16 U/L (ref 15–41)
Albumin: 2.7 g/dL — ABNORMAL LOW (ref 3.5–5.0)
Alkaline Phosphatase: 71 U/L (ref 38–126)
Anion gap: 8 (ref 5–15)
BUN: 14 mg/dL (ref 8–23)
CO2: 27 mmol/L (ref 22–32)
Calcium: 8.4 mg/dL — ABNORMAL LOW (ref 8.9–10.3)
Chloride: 103 mmol/L (ref 98–111)
Creatinine: 0.91 mg/dL (ref 0.44–1.00)
GFR, Estimated: >60 mL/min (ref >60)
Glucose, Bld: 164 mg/dL — ABNORMAL HIGH (ref 70–99)
Potassium: 3.4 mmol/L — ABNORMAL LOW (ref 3.5–5.1)
Sodium: 138 mmol/L (ref 135–145)
Total Bilirubin: 0.5 mg/dL (ref 0.0–1.2)
Total Protein: 5.4 g/dL — ABNORMAL LOW (ref 6.5–8.1)

## 2023-07-31 LAB — PREPARE RBC (CROSSMATCH)

## 2023-07-31 NOTE — Progress Notes (Unsigned)
 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  INTERVAL HISTORY: Patient returns to clinic today for repeat laboratory work and consideration of blood transfusion.  She continues to have chronic weakness and fatigue.  She has occasional dizziness and chronic peripheral neuropathy.  She denies any falls this past week.  She does not complain of vaginal bleeding today.  She has no other neurologic complaints.  She denies any recent fevers or illnesses.  She has no chest pain, shortness of breath, cough, or hemoptysis.  She denies any nausea, vomiting, constipation, or diarrhea.  She has no urinary complaints.  Patient offers no further specific complaints today.    REVIEW OF SYSTEMS:   Review of Systems  Constitutional:  Positive for malaise/fatigue. Negative for fever and weight loss.  Respiratory: Negative.  Negative for cough, hemoptysis and shortness of breath.   Cardiovascular: Negative.  Negative for chest pain and leg swelling.  Gastrointestinal: Negative.  Negative for abdominal pain, blood in stool and melena.  Genitourinary: Negative.  Negative for dysuria.  Musculoskeletal: Negative.  Negative for back pain and falls.  Skin: Negative.  Negative for rash.  Neurological:  Positive for dizziness and weakness. Negative for focal weakness and headaches.  Psychiatric/Behavioral: Negative.  The patient is not nervous/anxious.     As per HPI. Otherwise, a complete review  of systems is negative.  PAST MEDICAL HISTORY: Past Medical History:  Diagnosis Date   Adenomatous colon polyp    Aortic stenosis    Arthritis    Asthma    Cancer (HCC)    Claudication (HCC)    COPD (chronic obstructive pulmonary disease) (HCC)    Depression    Diabetes mellitus without complication (HCC)    Esophageal dysphagia    GERD (gastroesophageal reflux disease)    Hyperlipemia    Hypertension    Obesity    Severe obesity (BMI 35.0-35.9 with comorbidity) (HCC)     PAST SURGICAL HISTORY: Past Surgical History:  Procedure Laterality Date   BLADDER SURGERY     COLONOSCOPY WITH PROPOFOL N/A 08/05/2015   Procedure: COLONOSCOPY WITH PROPOFOL;  Surgeon: Elnita Maxwell, MD;  Location[facility] ENDOSCOPY;  Service: Endoscopy;  Laterality: N/A;   ESOPHAGOGASTRODUODENOSCOPY N/A 01/09/2022   Procedure: ESOPHAGOGASTRODUODENOSCOPY (EGD);  Surgeon: Regis Bill, MD;  Location: [facility] ENDOSCOPY;  Service: Endoscopy;  Laterality: N/A;   FOOT SURGERY Left    FRACTURE SURGERY     IR CV LINE INJECTION  06/22/2022   IR IMAGING GUIDED PORT INSERTION  06/05/2022   IR PORT REPAIR CENTRAL VENOUS ACCESS DEVICE  07/02/2022   LOWER EXTREMITY ANGIOGRAPHY Left 02/04/2023   Procedure: Lower Extremity Angiography;  Surgeon: Annice Needy, MD;  Location: [facility] INVASIVE CV LAB;  Service: Cardiovascular;  Laterality: Left;   ORIF ANKLE FRACTURE Left 03/26/2017   Procedure: OPEN REDUCTION INTERNAL FIXATION (ORIF) ANKLE FRACTURE;  Surgeon: Christena Flake, MD;  Location: [facility] ORS;  Service: Orthopedics;  Laterality: Left;    FAMILY HISTORY: Family History  Problem Relation Age of Onset   CVA Mother    Diabetes Mother  CAD Father    Breast cancer Neg Hx     ADVANCED DIRECTIVES (Y/N):  N  HEALTH MAINTENANCE: Social History   Tobacco Use   Smoking status: Former    Current packs/day: 1.00    Average packs/day: 1 pack/day for 40.0 years (40.0 ttl pk-yrs)    Types: Cigarettes   Smokeless  tobacco: Never   Tobacco comments:    Quite 2015  Vaping Use   Vaping status: Never Used  Substance Use Topics   Alcohol use: No   Drug use: No     Colonoscopy:  PAP:  Bone density:  Lipid panel:  No Known Allergies  Current Outpatient Medications  Medication Sig Dispense Refill   doxycycline (VIBRA-TABS) 100 MG tablet Take 1 tablet (100 mg total) by mouth 2 (two) times daily. 14 tablet 0   albuterol (VENTOLIN HFA) 108 (90 Base) MCG/ACT inhaler Inhale 2 puffs into the lungs every 6 (six) hours as needed for wheezing or shortness of breath. 1 each 1   amLODipine (NORVASC) 10 MG tablet Take 10 mg by mouth daily.     aspirin EC 81 MG tablet Take 1 tablet (81 mg total) by mouth daily. Swallow whole. 150 tablet 2   atorvastatin (LIPITOR) 80 MG tablet Take 80 mg by mouth daily.     buPROPion (WELLBUTRIN XL) 300 MG 24 hr tablet TAKE 1 TABLET BY MOUTH DAILY 90 tablet 3   Cholecalciferol (VITAMIN D3) 50 MCG (2000 UT) capsule Take 2,000 Units by mouth daily.     citalopram (CELEXA) 20 MG tablet Take 20 mg by mouth daily.     cloNIDine (CATAPRES) 0.2 MG tablet Take 0.2 mg by mouth 2 (two) times daily.     clopidogrel (PLAVIX) 75 MG tablet Take 1 tablet (75 mg total) by mouth daily. 90 tablet 3   Continuous Glucose Receiver (FREESTYLE LIBRE 3 READER) DEVI Use to check blood glucose continuously. Dx: E11.8, Z79.4 1 each 0   Continuous Glucose Sensor (FREESTYLE LIBRE 3 PLUS SENSOR) MISC Change sensor every 15 days. E11.8, Z79.4. 6 each 3   cyanocobalamin (VITAMIN B12) 1000 MCG tablet Take 1 tablet (1,000 mcg total) by mouth daily. 90 tablet 3   fluticasone-salmeterol (WIXELA INHUB) 250-50 MCG/ACT AEPB Inhale 1 puff into the lungs in the morning and at bedtime. 180 each 6   glipiZIDE (GLUCOTROL XL) 5 MG 24 hr tablet TAKE 1 TABLET BY MOUTH DAILY 30 tablet 1   insulin aspart (NOVOLOG) 100 UNIT/ML injection Inject 7 Units into the skin 3 (three) times daily before meals.     insulin degludec  (TRESIBA FLEXTOUCH) 100 UNIT/ML FlexTouch Pen Inject 25 Units into the skin daily. 25 units once daily - REPLACES Levemir     Iron, Ferrous Sulfate, 325 (65 Fe) MG TABS Take 325 mg by mouth every other day. 90 tablet 3   losartan-hydrochlorothiazide (HYZAAR) 100-25 MG tablet Take 1 tablet by mouth daily.     megestrol (MEGACE) 40 MG tablet Take 1 tablet (40 mg total) by mouth daily. 30 tablet 2   metoprolol succinate (TOPROL-XL) 50 MG 24 hr tablet Take 1 tablet by mouth daily.     montelukast (SINGULAIR) 10 MG tablet Take 10 mg by mouth at bedtime.     ondansetron (ZOFRAN) 8 MG tablet Take 1 tablet (8 mg total) by mouth every 8 (eight) hours as needed for nausea or vomiting. 30 tablet 0   ONETOUCH VERIO test strip 1 each by Other route 3 (three) times daily. 100  each 10   Semaglutide,0.25 or 0.5MG /DOS, 2 MG/3ML SOPN Inject 0.5 mg into the skin once a week. 3 mL 3   Tiotropium Bromide Monohydrate (SPIRIVA RESPIMAT) 2.5 MCG/ACT AERS Inhale 2 puffs into the lungs daily. 60 each 11   No current facility-administered medications for this visit.   Facility-Administered Medications Ordered in Other Visits  Medication Dose Route Frequency Provider Last Rate Last Admin   0.9 %  sodium chloride infusion (Manually program via Guardrails IV Fluids)  250 mL Intravenous Continuous Jeralyn Ruths, MD   Stopped at 08/01/23 1535    OBJECTIVE: Vitals:   07/31/23 1400  BP: (!) 139/51  Pulse: 60  Resp: 18  Temp: (!) 97.5 F (36.4 C)  SpO2: 99%       Body mass index is 30.36 kg/m.    ECOG FS:1 - Symptomatic but completely ambulatory  General: Well-developed, well-nourished, no acute distress. Eyes: Pink conjunctiva, anicteric sclera. HEENT: Normocephalic, moist mucous membranes. Lungs: No audible wheezing or coughing. Heart: Regular rate and rhythm. Abdomen: Soft, nontender, no obvious distention. Musculoskeletal: No edema, cyanosis, or clubbing. Neuro: Alert, answering all questions  appropriately. Cranial nerves grossly intact. Skin: No rashes or petechiae noted. Psych: Normal affect.  LAB RESULTS:  Lab Results  Component Value Date   NA 138 07/31/2023   K 3.4 (L) 07/31/2023   CL 103 07/31/2023   CO2 27 07/31/2023   GLUCOSE 164 (H) 07/31/2023   BUN 14 07/31/2023   CREATININE 0.91 07/31/2023   CALCIUM 8.4 (L) 07/31/2023   PROT 5.4 (L) 07/31/2023   ALBUMIN 2.7 (L) 07/31/2023   AST 16 07/31/2023   ALT 13 07/31/2023   ALKPHOS 71 07/31/2023   BILITOT 0.5 07/31/2023   GFRNONAA >60 07/31/2023   GFRAA >60 03/27/2017    Lab Results  Component Value Date   WBC 2.6 (L) 07/31/2023   NEUTROABS 1.8 07/31/2023   HGB 7.6 (L) 07/31/2023   HCT 24.7 (L) 07/31/2023   MCV 89.8 07/31/2023   PLT 202 07/31/2023     STUDIES: No results found.    ONCOLOGY HISTORY: Patient was initially hesitant to undergo chemotherapy and elected to do XRT only which was completed on April 18, 2022.  CT scan results from October 08, 2022 reviewed independently with no obvious evidence of progressive disease. She completed 6 cycles of carboplatinum, Taxol, and Keytruda on October 03, 2022.  Patient then initiated maintenance Keytruda on October 23, 2022.  PET scan results from January 15, 2023 reviewed independently with mild nonspecific residual hypermetabolism in the lower uterine segment as well as small bilateral common iliac nodes possibly suggesting nodal metastasis.  Patient was seen by gynecology oncology who determined surgical intervention is not an option.  They recommend discontinuing Keytruda for progressive disease and initiating single agent Doxil every 28 days.  Patient received 3 cycles of Doxil before ration of disease.  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  PET scan results from June 14, 2023 reviewed independently   with significant progression of disease.  Case discussed with gynecology oncology.  Will discontinue Doxil.  Patient will now receive single agent Taxol on days 1, 8, and 15 with day 22 off.  Will reimage after 3 cycles.  Can also consider lenvatinib or possibly gemcitabine in the future.  Return to clinic in 1 week for further evaluation and consideration of cycle 2, day 1.  Port: Port revision successful.  Proceed with treatment as above. Hyperglycemia: Patient has improved blood glucose control.  Continue evaluation and treatment per primary care.   Neutropenia: Resolved. Anemia: Hemoglobin decreased to 7.6.  Proceed with 1 unit packed red blood cells tomorrow.  Previously, gynecology oncology cauterized several areas of bleeding.   Thrombocytopenia: Resolved. Vaginal bleeding: Patient does not complain of this today.  Secondary to progressive disease.  Follow-up with gynecology oncology as scheduled. Claudication symptoms: Resolved.  Patient underwent revascularization procedure on February 04, 2023. Right hamstring tendon rupture: Patient reports there is no plan for surgical repair. Rash: Resolved.  Likely secondary to Doxil which has been discontinued.   Poor appetite/weight loss: Improved.  Continue Megace as prescribed.  Consider dietary referral in the future. Total ulceration and erythema: Patient was given a prescription for doxycycline today. Neuropathy/dizziness: Patient was given a referral to neurology.  Patient expressed understanding and was in agreement with this plan. She also understands that She can call clinic at any time with any questions, concerns, or complaints.    Cancer Staging  Endometrial cancer [facility]) Staging form: Corpus Uteri - Carcinoma and Carcinosarcoma, AJCC 8th Edition - Clinical stage from 05/30/2022: FIGO Stage IIIB (cT3b, cN0, cM0) - Signed by Jeralyn Ruths, MD on 05/30/2022 Stage prefix: Initial diagnosis   Jeralyn Ruths, MD   08/01/2023 4:05  PM

## 2023-07-31 NOTE — Patient Instructions (Signed)
 Freestyle Libre 3 Continuous Glucose Monitor (CGM)  Sensor Application Apply Sensors only on the back of your upper arm. If placed in other areas, the Sensor may not function properly and could give you inaccurate readings. Avoid areas with scars, moles, stretch marks, or lumps.   Select an area of skin that generally stays flat during your normal daily activities (no bending or folding). Choose a site that is at least 1 inch (2.5 cm) away from any injection sites. To prevent discomfort or skin irritation, you should select a different site other than the one most recently used. Wash application site using a plain soap, dry, and then clean with an alcohol wipe. This will help remove any oily residue that may prevent the sensor from sticking properly. Allow site to air dry before proceeding. Note: The area MUST be clean and dry, or the Sensor may not stay on for the full wear duration specified by your Sensor insert. 4. Unscrew the cap from the Sensor Applicator and set the cap aside.  5. Place the Sensor Applicator over the prepared site and push down firmly to apply the Sensor to your body. 6. Gently pull the Sensor Applicator away from your body. The Sensor should now be attached to your skin. 7. Make sure the Sensor is secure after application. Put the cap back on the Sensor Applicator. Discard the used Engineer, agricultural according to local regulations.  What If My Sensor Falls Off or What If My Sensor Isn't Working? Call [employer] Customer Care Team at [phone] Available 7 days a week from 8AM-8PM EST, excluding holidays  How to Prevent Your Sensor From Falling Off Early? Make sure you don't have lotion on your arms/abdomen before applying the sensor.  Use alcohol to clean the area of oils. Make sure your skin is clean and dry before applying a new sensor. You can go to your local pharmacy and ask for Tegaderm, or something similar.  It is a clear, waterproof patch that you can lay over the  sensor to help it to stay on longer. Or you can order patches from [place] (or elsewhere online). Try searching "Libre patch" on [employer]. We have some samples in the clinic if you would like to try some patches out before purchasing them.  Libre Customer Support: Customer Service is available to answer any questions you may have about your System. Customer Service is available at [phone] 7 Days a Week from 8AM to 8PM [place] Time; excluding holidays.

## 2023-07-31 NOTE — Progress Notes (Signed)
 Patient Assistance Program (PAP) Application   Manufacturer: [employer] ([employer])    (New enrollment) Medication(s): Breztri (will replace Spiriva  and Advair/Wixela)  Patient Portion of Application:  07/31/23:  Completed in clinic today and signed by patient Income Documentation: N/A - Electronic verification elected.  Provider Portion of Application:  07/31/23: Provider portion completed by Pharmacist and placed in PCP inbox for signature. Prescription(s): Included in MAP application. Breztri Aerosphere Inhalation Aerosol (120 puff inhaler) 175mcg/9mcg/4.8mcg 2 puffs twice daily in place of all other inhalers  #3 inhalers, 1 yr refill  Next Steps: [x]    Application filled out and placed in PCP desk folder for review/signature [x]    Upon PCP signature Application to be faxed to [employer] Fax: [phone] with copy of patient's insurance card AND scanned into patient chart  Forwarded to [facility] Patient Advocate Team for future correspondences/re-enrollment.  Note routed to PCP Clinic Pool to ensure PCP signature is obtained and application is faxed.  *[facility] clinic team - Please Addend/update this note as the "Next Steps" are completed in office*

## 2023-07-31 NOTE — Progress Notes (Signed)
 07/31/2023 Name: Terri [name] MRN: [id number] DOB: 1951-04-01  Subjective  No chief complaint on file.  Reason for visit: ?  Terri [name] is a 73 y.o. female with a history of diabetes (type 2), who presents today for a CGM teaching/setup visit.   Pertinent PMH also includes atherosclerosis w intermittent claudication, HTN, COPD, HLD, Endometrial cancer, obesity.  Known DM Complications:  nephropathy w microalbuminuria; PAD with claudication BLE; HTN    Care Team: Primary Care Provider: Dana Allan, MD Pulmonology Dr. Aundria Rud; Oncology Dr. Orlie Dakin; Cardiology at [facility]   Recent Summary of Change: ??Tresiba 22 units daily (12%??). 25 units on day of chemo and day following chemo.    Since Last visit / History of Present Illness: ?  Today, patient has brought their Libre 3+ reader/sensors with them to the visit.  Patient does not have a compatible smart phone.   Patient reports doing well overall. Continues to tolerate Ozempic well without issues. No change in bowel movements. No nausea or indigestion. Does note appetite reduction at baseline since chemo started, though not changed from previous dose of Ozempic.   Reported DM Regimen: ?  semaglutide (Ozempic) 0.5 mg weekly glipizide XL 5 mg once daily  Novolog (aspart) 7 units up to three times daily before meals Tresiba U100  22  units  daily (25u day of and day after chemo)   DM medications tried in the past:?  Metformin (diarrhea); switched to glipizide  SMBG: Glucometer - Checks BG twice daily via glucometer Date  FBG (mg/dL) 7/82  75 9/56  79 _______________________ 3/4  160 3/3  118 - last dose of steroid 3/2  121    2/29  156   2/28  205 2/27  313 2/26  156 - steroid course begin 2/25  82 2/24  89  2/23  113  2/21  99   Hypo/Hyperglycemia: ?  Symptoms of hypoglycemia since last visit:? no  If yes, it was treated by: n/a   Reported Diet:  Patient typically eats 3 meals per day. In the past couple of days has  been less hungry which is not usual for her. Notes she still attempts to eat something even if not hungry. Breakfast (8:30): Varies Lunch (12-1pm): Pizza (under 50 carbs); Sandwich; Chicken Engineer, petroleum (7-7:30 pm): Largest meal of the day Snacks: Sweet tooth (states that most sweets are sugar-free).  Exercise: No  DM Prevention:  Statin: Taking; high intensity.?  History of chronic kidney disease? yes History of albuminuria? yes, last UACR on 01/24/23 = 100.4 mg/g ACE/ARB - Taking; Urine MA/CR Ratio - elevated urinary albumin excretion.  Last foot exam: 02/25/2023 Tobacco Use: Former   Cardiovascular Risk Reduction History of clinical ASCVD?  PAD w Claudication The 10-year ASCVD risk score (Arnett DK, et al., 2019) is: 34.1% History of heart failure? no History of hyperlipidemia? yes Current BMI: 30.4 kg/m2 (Ht 62 in, Wt 75.3 kg) Taking statin? yes; high intensity (atorvastaitn 80 mg) Taking aspirin? indicated (secondary prevention);  Taking clopidogrel    Taking SGLT-2i? no Taking GLP- 1 RA? yes     _______________________________________________  Objective    Review of Systems:? Limited in the setting of virtual visit  GI:? No nausea, vomiting, constipation, diarrhea, abdominal pain, dyspepsia, change in bowel habits  Endocrine:? No polyuria, polyphagia or blurred vision    Physical Examination:  Vitals:  Wt Readings from Last 3 Encounters:  07/31/23 166 lb (75.3 kg)  07/24/23 169 lb 9.6 oz (76.9 kg)  07/23/23 167  lb 11.2 oz (76.1 kg)   BP Readings from Last 3 Encounters:  07/31/23 (!) 139/51  07/24/23 110/62  07/23/23 (!) 148/58   Pulse Readings from Last 3 Encounters:  07/31/23 60  07/24/23 81  07/23/23 67   Labs:?  Lab Results  Component Value Date   HGBA1C 5.7 (A) 06/26/2023   HGBA1C 7.2 (H) 12/25/2022   GLUCOSE 164 (H) 07/31/2023   MICRALBCREAT 10.4 01/24/2023   CREATININE 0.91 07/31/2023   CREATININE 0.96 07/23/2023   CREATININE 0.84  07/17/2023   GFR 52.91 (L) 01/24/2023   Lab Results  Component Value Date   CHOL 156 01/24/2023   LDLCALC 87 01/24/2023   HDL 46.20 01/24/2023   TRIG 112.0 01/24/2023   ALT 13 07/31/2023   ALT 11 07/23/2023   AST 16 07/31/2023   AST 12 (L) 07/23/2023     Chemistry      Component Value Date/Time   NA 138 07/31/2023 1325   K 3.4 (L) 07/31/2023 1325   CL 103 07/31/2023 1325   CO2 27 07/31/2023 1325   BUN 14 07/31/2023 1325   CREATININE 0.91 07/31/2023 1325      Component Value Date/Time   CALCIUM 8.4 (L) 07/31/2023 1325   ALKPHOS 71 07/31/2023 1325   AST 16 07/31/2023 1325   ALT 13 07/31/2023 1325   BILITOT 0.5 07/31/2023 1325     The 10-year ASCVD risk score (Arnett DK, et al., 2019) is: 34.1%  Assessment and Plan:   1. CGM Training Freestyle Libre CGM training provided: System overview of Libre 3 Plus including 15 day sensor wear and appropriate site placements Reviewed trend arrows and treatment decisions, along with noting a 5-10 minute delay in readings and to always confirm low glucose alarms with glucometer if no symptoms.  Reviewed possibility of false compression lows Reviewed troubleshoot guides and to call [employer] Customer Support for technical issues or difficulties. Libre 3 Reader was set up successfully. Does not have compatible smart phone.    Patient successfully applied her first sensor under supervision of PharmD.    2. Diabetes, type 2: controlled A1c 5.7% (06/26/23), goal <7.5-8% without hypoglycemia given medical complexity and active cancer treatment. Today, fasting sugars are back at goal without readings in the 70s. No hypoglycemia. Tolerating Ozempic well. Appetite has been lower across the board. Not changed since Ozempic increase to 0.5 though will likely plan to maintain at this dose for now. She continues to lose weight slowly.   Current regimen: Ozempic 0.5 mg/wk, glipizide XL 5 mg daily, Tresiba 22 units daily, Novolog 7 units TIDAC Continue  medications today without changes. Continue Ozempic 0.5 mg without titration for now.  Marked her 1.0 mg Ozempic pens at 0.5 mg dose (36 clicks) Reviewed s/sx/tx hypoglycemia. Discussed incorporating snacks/protein shakes between meals if not hungry.  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  Does have hx urinary incontinence, though don't expect significant diuresis with A1c 5.7%. Ideally would allow for d/c glipizide and possibly reduction in insulin dose(s).  Will likely qualify for free SGLT2 through her inhaler PAP TZD: Avoiding due to possible weight gain/increase in fracture risk. Metformin: Hx intolerance    3. COPD Patient brought her new Spiriva inhaler to visit today. Assisted her with inserting the canister into her respimat device. Reviewed and primed device with patient. Reviewed steps for respimat use with patient. She successfully took her first dose of Spiriva. Signed PAP applications in office.  PAP application started Messaged Pulmonologist regarding PAP for Spiriva vs Triple therapy (to replace spiriva + Advair)   Follow up PharmD follow up via phone 3-4 weeks to review first month of freestyle readings  PCP fu scheduled 2 months.  Future Appointments  Date Time Provider Department Center  08/01/2023 12:00 PM [facility] None  08/01/2023  1:15 PM [facility] INFUSION CHAIR 13 [facility] None  08/06/2023  8:30 AM [facility] None  08/06/2023  9:00 AM [facility] FLUSH [facility] None  08/06/2023  9:30 AM Orlie Dakin, Tollie Pizza, MD [facility] None  08/06/2023 10:00 AM [facility] INFUSION CHAIR 21 [facility] None  08/14/2023  7:30 AM [facility] None  08/14/2023  8:15 AM [facility] FLUSH [facility] None  08/14/2023  8:45 AM Orlie Dakin, Tollie Pizza, MD [facility] None  08/14/2023  9:15 AM [facility] INFUSION CHAIR 10 [facility] None  08/16/2023 10:30 AM [facility]  08/21/2023  7:30 AM [facility] None  08/21/2023  8:15 AM [facility] FLUSH [facility] None  08/21/2023  8:45 AM Orlie Dakin, Tollie Pizza, MD [facility] None  08/21/2023  9:15 AM [facility] INFUSION CHAIR 11 [facility] None  09/04/2023  7:30 AM [facility] None  09/04/2023  8:45 AM [facility] FLUSH [facility] None  09/04/2023  9:15 AM Jeralyn Ruths, MD [facility] None  09/04/2023  9:45 AM [facility] INFUSION CHAIR 4 [facility] None  09/11/2023  8:00 AM [facility] None  09/11/2023  8:45 AM [facility]PORT FLUSH [facility] None  09/11/2023  9:15 AM Orlie Dakin, Tollie Pizza, MD [facility] None  09/11/2023  9:45 AM [facility] INFUSION CHAIR 3 [facility] None  09/18/2023  8:00 AM [facility] None  09/18/2023  9:00 AM [facility]PORT FLUSH [facility] None  09/18/2023  9:30 AM Jeralyn Ruths, MD [facility] None  09/18/2023 10:00 AM [facility] INFUSION CHAIR 1 [facility] None  09/23/2023  1:00 PM Dana Allan, MD [facility]  10/02/2023  8:30 AM [facility] None  10/02/2023  9:30 AM [facility] GYN ONC [facility] None  10/30/2023 11:30 AM [name], MD [facility] None  12/17/2023  2:00 PM [facility] VASC 2 [facility] None  12/17/2023  3:00 PM Dew, Marlow Baars, MD [facility] None   Loree Fee, PharmD Clinical Pharmacist [facility] [phone]

## 2023-07-31 NOTE — Progress Notes (Signed)
 Noted.

## 2023-07-31 NOTE — Progress Notes (Signed)
 Patient presented to clinic for office visit and picked up her [employer] shipment.  Contents:  2 boxes of Ozempic 0.5 mg 2 boxes of Ozempic 1.0 mg 4 boxes of Novolog pens

## 2023-08-01 ENCOUNTER — Encounter: Payer: Self-pay | Admitting: *Deleted

## 2023-08-01 ENCOUNTER — Encounter: Payer: Self-pay | Admitting: Oncology

## 2023-08-01 ENCOUNTER — Inpatient Hospital Stay

## 2023-08-01 DIAGNOSIS — C541 Malignant neoplasm of endometrium: Secondary | ICD-10-CM

## 2023-08-01 DIAGNOSIS — Z5111 Encounter for antineoplastic chemotherapy: Secondary | ICD-10-CM | POA: Diagnosis not present

## 2023-08-01 MED ORDER — DIPHENHYDRAMINE HCL 50 MG/ML IJ SOLN
25.0000 mg | Freq: Once | INTRAMUSCULAR | Status: AC
  Administered 2023-08-01: 25 mg via INTRAVENOUS
  Filled 2023-08-01: qty 1

## 2023-08-01 MED ORDER — SODIUM CHLORIDE 0.9% IV SOLUTION
250.0000 mL | INTRAVENOUS | Status: DC
  Administered 2023-08-01: 100 mL via INTRAVENOUS
  Filled 2023-08-01: qty 250

## 2023-08-01 MED ORDER — ACETAMINOPHEN 325 MG PO TABS
650.0000 mg | ORAL_TABLET | Freq: Once | ORAL | Status: AC
  Administered 2023-08-01: 650 mg via ORAL
  Filled 2023-08-01: qty 2

## 2023-08-01 MED ORDER — HEPARIN SOD (PORK) LOCK FLUSH 100 UNIT/ML IV SOLN
500.0000 [IU] | Freq: Every day | INTRAVENOUS | Status: AC | PRN
  Administered 2023-08-01: 500 [IU]
  Filled 2023-08-01: qty 5

## 2023-08-01 MED ORDER — DOXYCYCLINE HYCLATE 100 MG PO TABS: 100.0000 mg | ORAL_TABLET | Freq: Two times a day (BID) | ORAL | 0 refills | Status: DC

## 2023-08-01 NOTE — Patient Instructions (Signed)
 Blood Transfusion, Adult A blood transfusion is a procedure in which you receive blood through an IV tube. You may need this procedure because of: A bleeding disorder. An illness. An injury. A surgery. The blood may come from someone else (a donor). You may also be able to donate blood for yourself before a surgery. The blood given in a transfusion may be made up of different types of cells. You may get: Red blood cells. These carry oxygen to the cells in the body. Platelets. These help your blood to clot. Plasma. This is the liquid part of your blood. It carries proteins and other substances through the body. White blood cells. These help you fight infections. If you have a clotting disorder, you may also get other types of blood products. Depending on the type of blood product, this procedure may take 1-4 hours to complete. Tell your doctor about: Any bleeding problems you have. Any reactions you have had during a blood transfusion in the past. Any allergies you have. All medicines you are taking, including vitamins, herbs, eye drops, creams, and over-the-counter medicines. Any surgeries you have had. Any medical conditions you have. Whether you are pregnant or may be pregnant. What are the risks? Talk with your health care provider about risks. The most common problems include: A mild allergic reaction. This includes red, swollen areas of skin (hives) and itching. Fever or chills. This may be the body's response to new blood cells received. This may happen during or up to 4 hours after the transfusion. More serious problems may include: A serious allergic reaction. This includes breathing trouble or swelling around the face and lips. Too much fluid in the lungs. This may cause breathing problems. Lung injury. This causes breathing trouble and low oxygen in the blood. This can happen within hours of the transfusion or days later. Too much iron. This can happen after getting many blood  transfusions over a period of time. An infection or virus passed through the blood. This is rare. Donated blood is carefully tested before it is given. Your body's defense system (immune system) trying to attack the new blood cells. This is rare. Symptoms may include fever, chills, nausea, low blood pressure, and low back or chest pain. Donated cells attacking healthy tissues. This is rare. What happens before the procedure? You will have a blood test to find out your blood type. The test also finds out what type of blood your body will accept and matches it to the donor type. If you are going to have a planned surgery, you may be able to donate your own blood. This may be done in case you need a transfusion. You will have your temperature, blood pressure, and pulse checked. You may receive medicine to help prevent an allergic reaction. This may be done if you have had a reaction to a transfusion before. This medicine may be given to you by mouth or through an IV tube. What happens during the procedure?  An IV tube will be put into one of your veins. The bag of blood will be attached to your IV tube. Then, the blood will enter through your vein. Your temperature, blood pressure, and pulse will be checked often. This is done to find early signs of a transfusion reaction. Tell your nurse right away if you have any of these symptoms: Shortness of breath or trouble breathing. Chest or back pain. Fever or chills. Red, swollen areas of skin or itching. If you have any signs  or symptoms of a reaction, your transfusion will be stopped. You may also be given medicine. When the transfusion is finished, your IV tube will be taken out. Pressure may be put on the IV site for a few minutes. A bandage (dressing) will be put on the IV site. The procedure may vary among doctors and hospitals. What happens after the procedure? You will be monitored until you leave the hospital or clinic. This includes  checking your temperature, blood pressure, pulse, breathing rate, and blood oxygen level. Your blood may be tested to see how you have responded to the transfusion. You may be warmed with fluids or blankets. This is done to keep the temperature of your body normal. If you have your procedure in an outpatient setting, you will be told whom to contact to report any reactions. Where to find more information Visit the [employer]: redcross.org Summary A blood transfusion is a procedure in which you receive blood through an IV tube. The blood you are given may be made up of different blood cells. You may receive red blood cells, platelets, plasma, or white blood cells. Your temperature, blood pressure, and pulse will be checked often. After the procedure, your blood may be tested to see how you have responded. This information is not intended to replace advice given to you by your health care provider. Make sure you discuss any questions you have with your health care provider. Document Revised: 07/14/2021 Document Reviewed: 07/14/2021 Elsevier Patient Education  2024 [employer].

## 2023-08-01 NOTE — Patient Instructions (Signed)
 Referral sent to [facility] regarding dizziness, worsening balance issues.

## 2023-08-02 LAB — TYPE AND SCREEN
ABO/RH(D): A POS
Antibody Screen: NEGATIVE
Unit division: 0

## 2023-08-02 LAB — BPAM RBC
Blood Product Expiration Date: 202505052359
ISSUE DATE / TIME: 202504031329
Unit Type and Rh: 202505052359
Unit Type and Rh: 6200

## 2023-08-02 NOTE — Progress Notes (Signed)
 Faxed and placed in folder to be scanned into chart.

## 2023-08-06 ENCOUNTER — Ambulatory Visit

## 2023-08-06 ENCOUNTER — Inpatient Hospital Stay (HOSPITAL_BASED_OUTPATIENT_CLINIC_OR_DEPARTMENT_OTHER): Admitting: Oncology

## 2023-08-06 ENCOUNTER — Other Ambulatory Visit

## 2023-08-06 ENCOUNTER — Encounter: Payer: Self-pay | Admitting: Oncology

## 2023-08-06 ENCOUNTER — Ambulatory Visit: Admitting: Oncology

## 2023-08-06 ENCOUNTER — Inpatient Hospital Stay

## 2023-08-06 VITALS — BP 158/74 | HR 66

## 2023-08-06 VITALS — BP 133/104 | HR 63 | Temp 96.9°F | Resp 18 | Wt 163.0 lb

## 2023-08-06 DIAGNOSIS — C541 Malignant neoplasm of endometrium: Secondary | ICD-10-CM

## 2023-08-06 DIAGNOSIS — Z5111 Encounter for antineoplastic chemotherapy: Secondary | ICD-10-CM | POA: Diagnosis not present

## 2023-08-06 LAB — CMP (CANCER CENTER ONLY)
ALT: 13 U/L (ref 0–44)
AST: 16 U/L (ref 15–41)
Albumin: 3.1 g/dL — ABNORMAL LOW (ref 3.5–5.0)
Alkaline Phosphatase: 72 U/L (ref 38–126)
Anion gap: 9 (ref 5–15)
BUN: 24 mg/dL — ABNORMAL HIGH (ref 8–23)
CO2: 27 mmol/L (ref 22–32)
Calcium: 8.5 mg/dL — ABNORMAL LOW (ref 8.9–10.3)
Chloride: 103 mmol/L (ref 98–111)
Creatinine: 1.12 mg/dL — ABNORMAL HIGH (ref 0.44–1.00)
GFR, Estimated: 52 mL/min — ABNORMAL LOW (ref >60)
Glucose, Bld: 144 mg/dL — ABNORMAL HIGH (ref 70–99)
Potassium: 4 mmol/L (ref 3.5–5.1)
Sodium: 139 mmol/L (ref 135–145)
Total Bilirubin: 0.7 mg/dL (ref 0.0–1.2)
Total Protein: 5.8 g/dL — ABNORMAL LOW (ref 6.5–8.1)

## 2023-08-06 LAB — CBC WITH DIFFERENTIAL (CANCER CENTER ONLY)
Abs Immature Granulocytes: 0.06 10*3/uL (ref 0.00–0.07)
Basophils Absolute: 0 10*3/uL (ref 0.0–0.1)
Basophils Relative: 1 %
Eosinophils Absolute: 0.1 10*3/uL (ref 0.0–0.5)
Eosinophils Relative: 2 %
HCT: 35.4 % — ABNORMAL LOW (ref 36.0–46.0)
Hemoglobin: 11 g/dL — ABNORMAL LOW (ref 12.0–15.0)
Immature Granulocytes: 1 %
Lymphocytes Relative: 10 %
Lymphs Abs: 0.5 10*3/uL — ABNORMAL LOW (ref 0.7–4.0)
MCH: 27.4 pg (ref 26.0–34.0)
MCHC: 31.1 g/dL (ref 30.0–36.0)
MCV: 88.3 fL (ref 80.0–100.0)
Monocytes Absolute: 0.7 10*3/uL (ref 0.1–1.0)
Monocytes Relative: 14 %
Neutro Abs: 3.6 10*3/uL (ref 1.7–7.7)
Neutrophils Relative %: 72 %
Platelet Count: 250 10*3/uL (ref 150–400)
RBC: 4.01 MIL/uL (ref 3.87–5.11)
RDW: 17 % — ABNORMAL HIGH (ref 11.5–15.5)
WBC Count: 4.9 10*3/uL (ref 4.0–10.5)
nRBC: 0 % (ref 0.0–0.2)

## 2023-08-06 LAB — SAMPLE TO BLOOD BANK

## 2023-08-06 MED ORDER — DIPHENHYDRAMINE HCL 50 MG/ML IJ SOLN
25.0000 mg | Freq: Once | INTRAMUSCULAR | Status: AC
  Administered 2023-08-06: 25 mg via INTRAVENOUS
  Filled 2023-08-06: qty 1

## 2023-08-06 MED ORDER — SODIUM CHLORIDE 0.9 % IV SOLN
INTRAVENOUS | Status: DC
  Filled 2023-08-06: qty 250

## 2023-08-06 MED ORDER — SODIUM CHLORIDE 0.9 % IV SOLN
80.0000 mg/m2 | Freq: Once | INTRAVENOUS | Status: AC
  Administered 2023-08-06: 150 mg via INTRAVENOUS
  Filled 2023-08-06: qty 25

## 2023-08-06 MED ORDER — FAMOTIDINE IN NACL 20-0.9 MG/50ML-% IV SOLN
20.0000 mg | Freq: Once | INTRAVENOUS | Status: AC
Start: 2023-08-06 — End: 2023-08-06
  Administered 2023-08-06: 20 mg via INTRAVENOUS
  Filled 2023-08-06: qty 50

## 2023-08-06 MED ORDER — DEXAMETHASONE SODIUM PHOSPHATE 10 MG/ML IJ SOLN
10.0000 mg | Freq: Once | INTRAMUSCULAR | Status: AC
Start: 2023-08-06 — End: 2023-08-06
  Administered 2023-08-06: 10 mg via INTRAVENOUS
  Filled 2023-08-06: qty 1

## 2023-08-06 MED ORDER — HEPARIN SOD (PORK) LOCK FLUSH 100 UNIT/ML IV SOLN
500.0000 [IU] | Freq: Once | INTRAVENOUS | Status: AC | PRN
Start: 2023-08-06 — End: 2023-08-06
  Administered 2023-08-06: 500 [IU]
  Filled 2023-08-06: qty 5

## 2023-08-06 NOTE — Progress Notes (Signed)
 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  INTERVAL HISTORY: Patient returns to clinic today for further evaluation and consideration of cycle 2, day 1 of single agent Taxol.  She had another fall this week, but did not seek medical attention.  She continues to have occasional dizziness and chronic peripheral neuropathy.  She does not complain of vaginal bleeding today.  She has no other neurologic complaints.  She denies any recent fevers or illnesses.  She has no chest pain, shortness of breath, cough, or hemoptysis.  She denies any nausea, vomiting, constipation, or diarrhea.  She has no urinary complaints.  Patient offers no further specific complaints today.  REVIEW OF SYSTEMS:   Review of Systems  Constitutional:  Positive for malaise/fatigue. Negative for fever and weight loss.  Respiratory: Negative.  Negative for cough, hemoptysis and shortness of breath.   Cardiovascular: Negative.  Negative for chest pain and leg swelling.  Gastrointestinal: Negative.  Negative for abdominal pain, blood in stool and melena.  Genitourinary: Negative.  Negative for dysuria.  Musculoskeletal:  Positive for falls. Negative for back pain.  Skin: Negative.  Negative for rash.  Neurological:  Positive for dizziness, sensory change and weakness. Negative for focal weakness and headaches.  Psychiatric/Behavioral: Negative.  The patient is not nervous/anxious.     As per HPI.  Otherwise, a complete review of systems is negative.  PAST MEDICAL HISTORY: Past Medical History:  Diagnosis Date   Adenomatous colon polyp    Aortic stenosis    Arthritis    Asthma    Cancer (HCC)    Claudication (HCC)    COPD (chronic obstructive pulmonary disease) (HCC)    Depression    Diabetes mellitus without complication (HCC)    Esophageal dysphagia    GERD (gastroesophageal reflux disease)    Hyperlipemia    Hypertension    Obesity    Severe obesity (BMI 35.0-35.9 with comorbidity) (HCC)     PAST SURGICAL HISTORY: Past Surgical History:  Procedure Laterality Date   BLADDER SURGERY     COLONOSCOPY WITH PROPOFOL N/A 08/05/2015   Procedure: COLONOSCOPY WITH PROPOFOL;  Surgeon: Elnita Maxwell, MD;  Location: [facility] ENDOSCOPY;  Service: Endoscopy;  Laterality: N/A;   ESOPHAGOGASTRODUODENOSCOPY N/A 01/09/2022   Procedure: ESOPHAGOGASTRODUODENOSCOPY (EGD);  Surgeon: Regis Bill, MD;  Location: [facility] ENDOSCOPY;  Service: Endoscopy;  Laterality: N/A;   FOOT SURGERY Left    FRACTURE SURGERY     IR CV LINE INJECTION  06/22/2022   IR IMAGING GUIDED PORT INSERTION  06/05/2022   IR PORT REPAIR CENTRAL VENOUS ACCESS DEVICE  07/02/2022   LOWER EXTREMITY ANGIOGRAPHY Left 02/04/2023   Procedure: Lower Extremity Angiography;  Surgeon: Annice Needy, MD;  Location: [facility] INVASIVE CV LAB;  Service: Cardiovascular;  Laterality: Left;   ORIF ANKLE FRACTURE Left 03/26/2017   Procedure: OPEN REDUCTION INTERNAL FIXATION (ORIF) ANKLE FRACTURE;  Surgeon: Christena Flake, MD;  Location: [facility] ORS;  Service: Orthopedics;  Laterality: Left;    FAMILY HISTORY: Family History  Problem Relation Age of Onset   CVA Mother  Diabetes Mother    CAD Father    Breast cancer Neg Hx     ADVANCED DIRECTIVES (Y/N):  N  HEALTH MAINTENANCE: Social History   Tobacco Use   Smoking status: Former    Current packs/day: 1.00    Average packs/day: 1 pack/day for 40.0 years (40.0 ttl pk-yrs)    Types:  Cigarettes   Smokeless tobacco: Never   Tobacco comments:    Quite 2015  Vaping Use   Vaping status: Never Used  Substance Use Topics   Alcohol use: No   Drug use: No     Colonoscopy:  PAP:  Bone density:  Lipid panel:  No Known Allergies  Current Outpatient Medications  Medication Sig Dispense Refill   albuterol (VENTOLIN HFA) 108 (90 Base) MCG/ACT inhaler Inhale 2 puffs into the lungs every 6 (six) hours as needed for wheezing or shortness of breath. 1 each 1   amLODipine (NORVASC) 10 MG tablet Take 10 mg by mouth daily.     aspirin EC 81 MG tablet Take 1 tablet (81 mg total) by mouth daily. Swallow whole. 150 tablet 2   atorvastatin (LIPITOR) 80 MG tablet Take 80 mg by mouth daily.     buPROPion (WELLBUTRIN XL) 300 MG 24 hr tablet TAKE 1 TABLET BY MOUTH DAILY 90 tablet 3   Cholecalciferol (VITAMIN D3) 50 MCG (2000 UT) capsule Take 2,000 Units by mouth daily.     citalopram (CELEXA) 20 MG tablet Take 20 mg by mouth daily.     cloNIDine (CATAPRES) 0.2 MG tablet Take 0.2 mg by mouth 2 (two) times daily.     clopidogrel (PLAVIX) 75 MG tablet Take 1 tablet (75 mg total) by mouth daily. 90 tablet 3   Continuous Glucose Receiver (FREESTYLE LIBRE 3 READER) DEVI Use to check blood glucose continuously. Dx: E11.8, Z79.4 1 each 0   Continuous Glucose Sensor (FREESTYLE LIBRE 3 PLUS SENSOR) MISC Change sensor every 15 days. E11.8, Z79.4. 6 each 3   cyanocobalamin (VITAMIN B12) 1000 MCG tablet Take 1 tablet (1,000 mcg total) by mouth daily. 90 tablet 3   doxycycline (VIBRA-TABS) 100 MG tablet Take 1 tablet (100 mg total) by mouth 2 (two) times daily. 14 tablet 0   fluticasone-salmeterol (WIXELA INHUB) 250-50 MCG/ACT AEPB Inhale 1 puff into the lungs in the morning and at bedtime. 180 each 6   glipiZIDE (GLUCOTROL XL) 5 MG 24 hr tablet TAKE 1 TABLET BY MOUTH DAILY 30 tablet 1   insulin aspart (NOVOLOG) 100 UNIT/ML injection Inject 7 Units into the skin 3 (three) times daily before meals.      insulin degludec (TRESIBA FLEXTOUCH) 100 UNIT/ML FlexTouch Pen Inject 25 Units into the skin daily. 25 units once daily - REPLACES Levemir     Iron, Ferrous Sulfate, 325 (65 Fe) MG TABS Take 325 mg by mouth every other day. 90 tablet 3   losartan-hydrochlorothiazide (HYZAAR) 100-25 MG tablet Take 1 tablet by mouth daily.     megestrol (MEGACE) 40 MG tablet Take 1 tablet (40 mg total) by mouth daily. 30 tablet 2   metoprolol succinate (TOPROL-XL) 50 MG 24 hr tablet Take 1 tablet by mouth daily.     montelukast (SINGULAIR) 10 MG tablet Take 10 mg by mouth at bedtime.     ondansetron (ZOFRAN) 8 MG tablet Take 1 tablet (8 mg total) by mouth every 8 (eight) hours as needed for nausea or vomiting. 30 tablet 0   ONETOUCH VERIO test strip 1 each by Other route  3 (three) times daily. 100 each 10   Semaglutide,0.25 or 0.5MG /DOS, 2 MG/3ML SOPN Inject 0.5 mg into the skin once a week. 3 mL 3   Tiotropium Bromide Monohydrate (SPIRIVA RESPIMAT) 2.5 MCG/ACT AERS Inhale 2 puffs into the lungs daily. 60 each 11   No current facility-administered medications for this visit.   Facility-Administered Medications Ordered in Other Visits  Medication Dose Route Frequency Provider Last Rate Last Admin   0.9 %  sodium chloride infusion   Intravenous Continuous Orlie Dakin, Tollie Pizza, MD       dexamethasone (DECADRON) injection 10 mg  10 mg Intravenous Once Jeralyn Ruths, MD       diphenhydrAMINE (BENADRYL) injection 25 mg  25 mg Intravenous Once Jeralyn Ruths, MD       famotidine (PEPCID) IVPB 20 mg premix  20 mg Intravenous Once Jeralyn Ruths, MD       heparin lock flush 100 unit/mL  500 Units Intracatheter Once PRN Jeralyn Ruths, MD       PACLitaxel (TAXOL) 150 mg in sodium chloride 0.9 % 250 mL chemo infusion (</= 80mg /m2)  80 mg/m2 (Treatment Plan Recorded) Intravenous Once Jeralyn Ruths, MD        OBJECTIVE: Vitals:   08/06/23 1316  BP: (!) 133/104  Pulse: 63  Resp: 18  Temp: (!)  96.9 F (36.1 C)       Body mass index is 29.81 kg/m.    ECOG FS:1 - Symptomatic but completely ambulatory  General: Well-developed, well-nourished, no acute distress. Eyes: Pink conjunctiva, anicteric sclera. HEENT: Normocephalic, moist mucous membranes. Lungs: No audible wheezing or coughing. Heart: Regular rate and rhythm. Abdomen: Soft, nontender, no obvious distention. Musculoskeletal: No edema, cyanosis, or clubbing. Neuro: Alert, answering all questions appropriately. Cranial nerves grossly intact. Skin: No rashes or petechiae noted.  Ecchymosis noted over right eye. Psych: Normal affect.   LAB RESULTS:  Lab Results  Component Value Date   NA 139 08/06/2023   K 4.0 08/06/2023   CL 103 08/06/2023   CO2 27 08/06/2023   GLUCOSE 144 (H) 08/06/2023   BUN 24 (H) 08/06/2023   CREATININE 1.12 (H) 08/06/2023   CALCIUM 8.5 (L) 08/06/2023   PROT 5.8 (L) 08/06/2023   ALBUMIN 3.1 (L) 08/06/2023   AST 16 08/06/2023   ALT 13 08/06/2023   ALKPHOS 72 08/06/2023   BILITOT 0.7 08/06/2023   GFRNONAA 52 (L) 08/06/2023   GFRAA >60 03/27/2017    Lab Results  Component Value Date   WBC 4.9 08/06/2023   NEUTROABS 3.6 08/06/2023   HGB 11.0 (L) 08/06/2023   HCT 35.4 (L) 08/06/2023   MCV 88.3 08/06/2023   PLT 250 08/06/2023     STUDIES: No results found.    ONCOLOGY HISTORY: Patient was initially hesitant to undergo chemotherapy and elected to do XRT only which was completed on April 18, 2022.  CT scan results from October 08, 2022 reviewed independently with no obvious evidence of progressive disease. She completed 6 cycles of carboplatinum, Taxol, and Keytruda on October 03, 2022.  Patient then initiated maintenance Keytruda on October 23, 2022.  PET scan results from January 15, 2023 reviewed independently with mild nonspecific residual hypermetabolism in the lower uterine segment as well as small bilateral common iliac nodes possibly suggesting nodal metastasis.  Patient was seen  by gynecology oncology who determined surgical intervention is not an option.  They recommend discontinuing Keytruda for progressive disease and initiating single agent Doxil every 28 days.  Patient received 3 cycles of Doxil before progression of disease her last dose was given on June 05, 2023.  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  PET scan results from June 14, 2023 reviewed independently  with significant progression of disease.  Case discussed with gynecology oncology.  Patient will now receive single agent Taxol on days 1, 8, and 15 with day 22 off.  Will reimage after 3 cycles.  Can also consider lenvatinib or possibly gemcitabine in the future.  Proceed with cycle 2, day 1 of treatment today.  Return to clinic in 1 week for further evaluation and consideration of cycle 2, day 8.   Port: Port revision successful.  Proceed with treatment as above. Hyperglycemia: Patient has improved blood glucose control.  Continue evaluation and treatment per primary care.   Neutropenia: Resolved. Anemia: Hemoglobin improved to 11.0 with transfusion.  Previously, gynecology oncology cauterized several areas of bleeding.   Thrombocytopenia: Resolved. Vaginal bleeding: Patient does not complain of this today.  Secondary to progressive disease.  Follow-up with gynecology oncology as scheduled. Claudication symptoms: Resolved.  Patient underwent revascularization procedure on February 04, 2023. Right hamstring tendon rupture: Patient reports there is no plan for surgical repair. Rash: Resolved.  Likely secondary to Doxil which has been discontinued.   Poor appetite/weight loss: Continue Megace as prescribed.  Patient refuses referral to dietary.   Toe ulceration and erythema: Patient completed 7-day course of doxycycline. Neuropathy/dizziness: Patient  was given a referral to neurology.  Patient expressed understanding and was in agreement with this plan. She also understands that She can call clinic at any time with any questions, concerns, or complaints.    Cancer Staging  Endometrial cancer [facility]) Staging form: Corpus Uteri - Carcinoma and Carcinosarcoma, AJCC 8th Edition - Clinical stage from 05/30/2022: FIGO Stage IIIB (cT3b, cN0, cM0) - Signed by Jeralyn Ruths, MD on 05/30/2022 Stage prefix: Initial diagnosis   Jeralyn Ruths, MD   08/06/2023 1:57 PM

## 2023-08-08 ENCOUNTER — Inpatient Hospital Stay

## 2023-08-08 ENCOUNTER — Ambulatory Visit

## 2023-08-08 ENCOUNTER — Other Ambulatory Visit

## 2023-08-08 ENCOUNTER — Ambulatory Visit: Admitting: Oncology

## 2023-08-12 ENCOUNTER — Encounter: Payer: Self-pay | Admitting: Pharmacist

## 2023-08-12 NOTE — Progress Notes (Signed)
 Brief Telephone Documentation Reason for Call: Patient called regarding question about CGM sensor.   Summary of Call: Patient reports her CGM sensor fell off. She started to put on a new sensor though didn't feel confident that she would do it correctly.  Has had issues with chemo regimen leading to dry skin which may impact sensor adhesion.  Has plenty of testing supplies at home to monitor her glucose.   Reports CGM working well prior to this. BG well-controlled on 22 units Tresiba. Notes occasionally will forget and take old dose of 25 units. One nighttime/morning with BG in the 60s. Will focus on administering only 22 units daily and will contact clinic should she see more lows.    Offered office visit or virtual video visit to walk through sensor application.  Patient prefers in-person. She will look through her calendar and cal back with dates that work best for her.  Will plan to provide her samples of overlay patches to keep sensor in place longer   Follow Up: Patient given direct line for further questions/concerns.  Daron Ellen, PharmD Clinical Pharmacist [facility] [phone]

## 2023-08-14 ENCOUNTER — Inpatient Hospital Stay

## 2023-08-14 ENCOUNTER — Encounter: Payer: Self-pay | Admitting: Oncology

## 2023-08-14 ENCOUNTER — Ambulatory Visit: Admitting: Oncology

## 2023-08-14 ENCOUNTER — Inpatient Hospital Stay (HOSPITAL_BASED_OUTPATIENT_CLINIC_OR_DEPARTMENT_OTHER): Admitting: Oncology

## 2023-08-14 ENCOUNTER — Other Ambulatory Visit

## 2023-08-14 ENCOUNTER — Ambulatory Visit

## 2023-08-14 VITALS — BP 112/86 | HR 64 | Temp 97.0°F | Resp 16 | Ht 62.0 in | Wt 164.0 lb

## 2023-08-14 DIAGNOSIS — Z5111 Encounter for antineoplastic chemotherapy: Secondary | ICD-10-CM | POA: Diagnosis not present

## 2023-08-14 DIAGNOSIS — C541 Malignant neoplasm of endometrium: Secondary | ICD-10-CM

## 2023-08-14 LAB — CMP (CANCER CENTER ONLY)
ALT: 18 U/L (ref 0–44)
AST: 17 U/L (ref 15–41)
Albumin: 3 g/dL — ABNORMAL LOW (ref 3.5–5.0)
Alkaline Phosphatase: 62 U/L (ref 38–126)
Anion gap: 7 (ref 5–15)
BUN: 19 mg/dL (ref 8–23)
CO2: 26 mmol/L (ref 22–32)
Calcium: 8.2 mg/dL — ABNORMAL LOW (ref 8.9–10.3)
Chloride: 106 mmol/L (ref 98–111)
Creatinine: 0.84 mg/dL (ref 0.44–1.00)
GFR, Estimated: >60 mL/min (ref >60)
Glucose, Bld: 162 mg/dL — ABNORMAL HIGH (ref 70–99)
Potassium: 3.7 mmol/L (ref 3.5–5.1)
Sodium: 139 mmol/L (ref 135–145)
Total Bilirubin: 0.6 mg/dL (ref 0.0–1.2)
Total Protein: 5.4 g/dL — ABNORMAL LOW (ref 6.5–8.1)

## 2023-08-14 LAB — CBC WITH DIFFERENTIAL (CANCER CENTER ONLY)
Abs Immature Granulocytes: 0.04 10*3/uL (ref 0.00–0.07)
Basophils Absolute: 0 10*3/uL (ref 0.0–0.1)
Basophils Relative: 1 %
Eosinophils Absolute: 0.1 10*3/uL (ref 0.0–0.5)
Eosinophils Relative: 2 %
HCT: 30.8 % — ABNORMAL LOW (ref 36.0–46.0)
Hemoglobin: 9.8 g/dL — ABNORMAL LOW (ref 12.0–15.0)
Immature Granulocytes: 1 %
Lymphocytes Relative: 7 %
Lymphs Abs: 0.4 10*3/uL — ABNORMAL LOW (ref 0.7–4.0)
MCH: 27.9 pg (ref 26.0–34.0)
MCHC: 31.8 g/dL (ref 30.0–36.0)
MCV: 87.7 fL (ref 80.0–100.0)
Monocytes Absolute: 0.4 10*3/uL (ref 0.1–1.0)
Monocytes Relative: 7 %
Neutro Abs: 4.8 10*3/uL (ref 1.7–7.7)
Neutrophils Relative %: 82 %
Platelet Count: 165 10*3/uL (ref 150–400)
RBC: 3.51 MIL/uL — ABNORMAL LOW (ref 3.87–5.11)
RDW: 16.2 % — ABNORMAL HIGH (ref 11.5–15.5)
WBC Count: 5.8 10*3/uL (ref 4.0–10.5)
nRBC: 0 % (ref 0.0–0.2)

## 2023-08-14 LAB — SAMPLE TO BLOOD BANK

## 2023-08-14 MED ORDER — SODIUM CHLORIDE 0.9 % IV SOLN
80.0000 mg/m2 | Freq: Once | INTRAVENOUS | Status: AC
  Administered 2023-08-14: 150 mg via INTRAVENOUS
  Filled 2023-08-14: qty 25

## 2023-08-14 MED ORDER — DIPHENHYDRAMINE HCL 50 MG/ML IJ SOLN
25.0000 mg | Freq: Once | INTRAMUSCULAR | Status: AC
  Administered 2023-08-14: 25 mg via INTRAVENOUS
  Filled 2023-08-14: qty 1

## 2023-08-14 MED ORDER — DEXAMETHASONE SODIUM PHOSPHATE 10 MG/ML IJ SOLN
10.0000 mg | Freq: Once | INTRAMUSCULAR | Status: AC
  Administered 2023-08-14: 10 mg via INTRAVENOUS
  Filled 2023-08-14: qty 1

## 2023-08-14 MED ORDER — HEPARIN SOD (PORK) LOCK FLUSH 100 UNIT/ML IV SOLN
500.0000 [IU] | Freq: Once | INTRAVENOUS | Status: DC | PRN
Start: 2023-08-14 — End: 2023-08-14
  Filled 2023-08-14: qty 5

## 2023-08-14 MED ORDER — FAMOTIDINE IN NACL 20-0.9 MG/50ML-% IV SOLN
20.0000 mg | Freq: Once | INTRAVENOUS | Status: AC
  Administered 2023-08-14: 20 mg via INTRAVENOUS
  Filled 2023-08-14: qty 50

## 2023-08-14 MED ORDER — SODIUM CHLORIDE 0.9 % IV SOLN
INTRAVENOUS | Status: DC
  Filled 2023-08-14: qty 250

## 2023-08-14 NOTE — Progress Notes (Signed)
 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  INTERVAL HISTORY: Patient returns to clinic today for further evaluation and consideration of cycle 2, day 8 of single agent Taxol.  She does not report any falls this week.  She continues to have occasional dizziness and chronic peripheral neuropathy.  She does not complain of vaginal bleeding today.  She has no other neurologic complaints.  She denies any recent fevers or illnesses.  She has no chest pain, shortness of breath, cough, or hemoptysis.  She denies any nausea, vomiting, constipation, or diarrhea.  She has no urinary complaints.  Patient offers no further specific complaints today.  REVIEW OF SYSTEMS:   Review of Systems  Constitutional:  Positive for malaise/fatigue. Negative for fever and weight loss.  Respiratory: Negative.  Negative for cough, hemoptysis and shortness of breath.   Cardiovascular: Negative.  Negative for chest pain and leg swelling.  Gastrointestinal: Negative.  Negative for abdominal pain, blood in stool and melena.  Genitourinary: Negative.  Negative for dysuria.  Musculoskeletal:  Positive for falls. Negative for back pain.  Skin: Negative.  Negative for rash.  Neurological:  Positive for dizziness, sensory change and weakness. Negative for focal weakness and headaches.  Psychiatric/Behavioral: Negative.  The patient is not nervous/anxious.     As per HPI. Otherwise, a complete review of  systems is negative.  PAST MEDICAL HISTORY: Past Medical History:  Diagnosis Date   Adenomatous colon polyp    Aortic stenosis    Arthritis    Asthma    Cancer (HCC)    Claudication (HCC)    COPD (chronic obstructive pulmonary disease) (HCC)    Depression    Diabetes mellitus without complication (HCC)    Esophageal dysphagia    GERD (gastroesophageal reflux disease)    Hyperlipemia    Hypertension    Obesity    Severe obesity (BMI 35.0-35.9 with comorbidity) (HCC)     PAST SURGICAL HISTORY: Past Surgical History:  Procedure Laterality Date   BLADDER SURGERY     COLONOSCOPY WITH PROPOFOL N/A 08/05/2015   Procedure: COLONOSCOPY WITH PROPOFOL;  Surgeon: Elnita Maxwell, MD;  Location: [facility] ENDOSCOPY;  Service: Endoscopy;  Laterality: N/A;   ESOPHAGOGASTRODUODENOSCOPY N/A 01/09/2022   Procedure: ESOPHAGOGASTRODUODENOSCOPY (EGD);  Surgeon: Regis Bill, MD;  Location[facility] ENDOSCOPY;  Service: Endoscopy;  Laterality: N/A;   FOOT SURGERY Left    FRACTURE SURGERY     IR CV LINE INJECTION  06/22/2022   IR IMAGING GUIDED PORT INSERTION  06/05/2022   IR PORT REPAIR CENTRAL VENOUS ACCESS DEVICE  07/02/2022   LOWER EXTREMITY ANGIOGRAPHY Left 02/04/2023   Procedure: Lower Extremity Angiography;  Surgeon: Annice Needy, MD;  Location: [facility] INVASIVE CV LAB;  Service: Cardiovascular;  Laterality: Left;   ORIF ANKLE FRACTURE Left 03/26/2017   Procedure: OPEN REDUCTION INTERNAL FIXATION (ORIF) ANKLE FRACTURE;  Surgeon: Christena Flake, MD;  Location: [facility] ORS;  Service: Orthopedics;  Laterality: Left;    FAMILY HISTORY: Family History  Problem Relation Age of Onset   CVA Mother    Diabetes Mother  CAD Father    Breast cancer Neg Hx     ADVANCED DIRECTIVES (Y/N):  N  HEALTH MAINTENANCE: Social History   Tobacco Use   Smoking status: Former    Current packs/day: 1.00    Average packs/day: 1 pack/day for 40.0 years (40.0 ttl pk-yrs)    Types: Cigarettes   Smokeless tobacco:  Never   Tobacco comments:    Quite 2015  Vaping Use   Vaping status: Never Used  Substance Use Topics   Alcohol use: No   Drug use: No     Colonoscopy:  PAP:  Bone density:  Lipid panel:  No Known Allergies  Current Outpatient Medications  Medication Sig Dispense Refill   albuterol (VENTOLIN HFA) 108 (90 Base) MCG/ACT inhaler Inhale 2 puffs into the lungs every 6 (six) hours as needed for wheezing or shortness of breath. 1 each 1   amLODipine (NORVASC) 10 MG tablet Take 10 mg by mouth daily.     aspirin EC 81 MG tablet Take 1 tablet (81 mg total) by mouth daily. Swallow whole. 150 tablet 2   atorvastatin (LIPITOR) 80 MG tablet Take 80 mg by mouth daily.     buPROPion (WELLBUTRIN XL) 300 MG 24 hr tablet TAKE 1 TABLET BY MOUTH DAILY 90 tablet 3   Cholecalciferol (VITAMIN D3) 50 MCG (2000 UT) capsule Take 2,000 Units by mouth daily.     citalopram (CELEXA) 20 MG tablet Take 20 mg by mouth daily.     cloNIDine (CATAPRES) 0.2 MG tablet Take 0.2 mg by mouth 2 (two) times daily.     clopidogrel (PLAVIX) 75 MG tablet Take 1 tablet (75 mg total) by mouth daily. 90 tablet 3   Continuous Glucose Receiver (FREESTYLE LIBRE 3 READER) DEVI Use to check blood glucose continuously. Dx: E11.8, Z79.4 1 each 0   Continuous Glucose Sensor (FREESTYLE LIBRE 3 PLUS SENSOR) MISC Change sensor every 15 days. E11.8, Z79.4. 6 each 3   cyanocobalamin (VITAMIN B12) 1000 MCG tablet Take 1 tablet (1,000 mcg total) by mouth daily. 90 tablet 3   doxycycline (VIBRA-TABS) 100 MG tablet Take 1 tablet (100 mg total) by mouth 2 (two) times daily. 14 tablet 0   fluticasone-salmeterol (WIXELA INHUB) 250-50 MCG/ACT AEPB Inhale 1 puff into the lungs in the morning and at bedtime. 180 each 6   glipiZIDE (GLUCOTROL XL) 5 MG 24 hr tablet TAKE 1 TABLET BY MOUTH DAILY 30 tablet 1   insulin aspart (NOVOLOG) 100 UNIT/ML injection Inject 7 Units into the skin 3 (three) times daily before meals.     insulin degludec (TRESIBA  FLEXTOUCH) 100 UNIT/ML FlexTouch Pen Inject 25 Units into the skin daily. 25 units once daily - REPLACES Levemir     Iron, Ferrous Sulfate, 325 (65 Fe) MG TABS Take 325 mg by mouth every other day. 90 tablet 3   losartan-hydrochlorothiazide (HYZAAR) 100-25 MG tablet Take 1 tablet by mouth daily.     megestrol (MEGACE) 40 MG tablet Take 1 tablet (40 mg total) by mouth daily. 30 tablet 2   metoprolol succinate (TOPROL-XL) 50 MG 24 hr tablet Take 1 tablet by mouth daily.     montelukast (SINGULAIR) 10 MG tablet Take 10 mg by mouth at bedtime.     ondansetron (ZOFRAN) 8 MG tablet Take 1 tablet (8 mg total) by mouth every 8 (eight) hours as needed for nausea or vomiting. 30 tablet 0   ONETOUCH VERIO test strip 1 each by Other route 3 (three) times daily. 100  each 10   Semaglutide,0.25 or 0.5MG /DOS, 2 MG/3ML SOPN Inject 0.5 mg into the skin once a week. 3 mL 3   Tiotropium Bromide Monohydrate (SPIRIVA RESPIMAT) 2.5 MCG/ACT AERS Inhale 2 puffs into the lungs daily. 60 each 11   No current facility-administered medications for this visit.    OBJECTIVE: Vitals:   08/14/23 1044  BP: 112/86  Pulse: 64  Resp: 16  Temp: (!) 97 F (36.1 C)  SpO2: 100%       Body mass index is 30 kg/m.    ECOG FS:1 - Symptomatic but completely ambulatory  General: Well-developed, well-nourished, no acute distress. Eyes: Pink conjunctiva, anicteric sclera. HEENT: Normocephalic, moist mucous membranes. Lungs: No audible wheezing or coughing. Heart: Regular rate and rhythm. Abdomen: Soft, nontender, no obvious distention. Musculoskeletal: No edema, cyanosis, or clubbing. Neuro: Alert, answering all questions appropriately. Cranial nerves grossly intact. Skin: No rashes or petechiae noted. Psych: Normal affect.  LAB RESULTS:  Lab Results  Component Value Date   NA 139 08/14/2023   K 3.7 08/14/2023   CL 106 08/14/2023   CO2 26 08/14/2023   GLUCOSE 162 (H) 08/14/2023   BUN 19 08/14/2023   CREATININE 0.84  08/14/2023   CALCIUM 8.2 (L) 08/14/2023   PROT 5.4 (L) 08/14/2023   ALBUMIN 3.0 (L) 08/14/2023   AST 17 08/14/2023   ALT 18 08/14/2023   ALKPHOS 62 08/14/2023   BILITOT 0.6 08/14/2023   GFRNONAA >60 08/14/2023   GFRAA >60 03/27/2017    Lab Results  Component Value Date   WBC 5.8 08/14/2023   NEUTROABS 4.8 08/14/2023   HGB 9.8 (L) 08/14/2023   HCT 30.8 (L) 08/14/2023   MCV 87.7 08/14/2023   PLT 165 08/14/2023     STUDIES: No results found.    ONCOLOGY HISTORY: Patient was initially hesitant to undergo chemotherapy and elected to do XRT only which was completed on April 18, 2022.  CT scan results from October 08, 2022 reviewed independently with no obvious evidence of progressive disease. She completed 6 cycles of carboplatinum, Taxol, and Keytruda on October 03, 2022.  Patient then initiated maintenance Keytruda on October 23, 2022.  PET scan results from January 15, 2023 reviewed independently with mild nonspecific residual hypermetabolism in the lower uterine segment as well as small bilateral common iliac nodes possibly suggesting nodal metastasis.  Patient was seen by gynecology oncology who determined surgical intervention is not an option.  They recommend discontinuing Keytruda for progressive disease and initiating single agent Doxil every 28 days.  Patient received 3 cycles of Doxil before progression of disease her last dose was given on June 05, 2023.  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  PET scan results from June 14, 2023 reviewed independently  with significant progression of disease.  Case discussed with gynecology oncology.  Patient will now receive single agent Taxol on days 1, 8, and 15 with day 22 off.  Will reimage after 3 cycles.  Can also consider lenvatinib or possibly gemcitabine in the future.  Proceed  with cycle 2, day 8 of treatment today.  Return to clinic in 1 week for further evaluation and consideration of cycle 2, day 15.   Port: Port revision successful.  Proceed with treatment as above. Hyperglycemia: Chronic and unchanged.  Continue evaluation and treatment per primary care.   Anemia: Hemoglobin has trended down to 9.8.  Previously,  gynecology oncology cauterized several areas of bleeding.   Vaginal bleeding: Patient does not complain of this today.  Secondary to progressive disease.  Follow-up with gynecology oncology as scheduled. Claudication symptoms: Resolved.  Patient underwent revascularization procedure on February 04, 2023. Right hamstring tendon rupture: Patient reports there is no plan for surgical repair. Rash: Resolved.  Likely secondary to Doxil which has been discontinued.   Poor appetite/weight loss: Continue Megace as prescribed.  Patient refuses referral to dietary.   Toe ulceration and erythema: Patient completed 7-day course of doxycycline. Neuropathy/dizziness: Patient was given a referral to neurology.  I spent a total of 30 minutes reviewing chart data, face-to-face evaluation with the patient, counseling and coordination of care as detailed above.   Patient expressed understanding and was in agreement with this plan. She also understands that She can call clinic at any time with any questions, concerns, or complaints.    Cancer Staging  Endometrial cancer [facility]) Staging form: Corpus Uteri - Carcinoma and Carcinosarcoma, AJCC 8th Edition - Clinical stage from 05/30/2022: FIGO Stage IIIB (cT3b, cN0, cM0) - Signed by Shellie Dials, MD on 05/30/2022 Stage prefix: Initial diagnosis   Shellie Dials, MD   08/14/2023 11:09 AM

## 2023-08-14 NOTE — Patient Instructions (Signed)
 [facility] - A DEPT OF [facility]  Discharge Instructions: Thank you for choosing [facility] to provide your oncology and hematology care.  If you have a lab appointment with the Cancer Center, please go directly to the Cancer Center and check in at the registration area.  Wear comfortable clothing and clothing appropriate for easy access to any Portacath or PICC line.   We strive to give you quality time with your provider. You may need to reschedule your appointment if you arrive late (15 or more minutes).  Arriving late affects you and other patients whose appointments are after yours.  Also, if you miss three or more appointments without notifying the office, you may be dismissed from the clinic at the provider's discretion.      For prescription refill requests, have your pharmacy contact our office and allow 72 hours for refills to be completed.    Today you received the following chemotherapy and/or immunotherapy agents Taxol.      To help prevent nausea and vomiting after your treatment, we encourage you to take your nausea medication as directed.  BELOW ARE SYMPTOMS THAT SHOULD BE REPORTED IMMEDIATELY: *FEVER GREATER THAN 100.4 F (38 C) OR HIGHER *CHILLS OR SWEATING *NAUSEA AND VOMITING THAT IS NOT CONTROLLED WITH YOUR NAUSEA MEDICATION *UNUSUAL SHORTNESS OF BREATH *UNUSUAL BRUISING OR BLEEDING *URINARY PROBLEMS (pain or burning when urinating, or frequent urination) *BOWEL PROBLEMS (unusual diarrhea, constipation, pain near the anus) TENDERNESS IN MOUTH AND THROAT WITH OR WITHOUT PRESENCE OF ULCERS (sore throat, sores in mouth, or a toothache) UNUSUAL RASH, SWELLING OR PAIN  UNUSUAL VAGINAL DISCHARGE OR ITCHING   Items with * indicate a potential emergency and should be followed up as soon as possible or go to the Emergency Department if any problems should occur.  Please show the CHEMOTHERAPY ALERT CARD or IMMUNOTHERAPY ALERT  CARD at check-in to the Emergency Department and triage nurse.  Should you have questions after your visit or need to cancel or reschedule your appointment, please contact [facility] - A DEPT OF [facility]  [phone] and follow the prompts.  Office hours are 8:00 a.m. to 4:30 p.m. Monday - Friday. Please note that voicemails left after 4:00 p.m. may not be returned until the following business day.  We are closed weekends and major holidays. You have access to a nurse at all times for urgent questions. Please call the main number to the clinic [phone] and follow the prompts.  For any non-urgent questions, you may also contact your provider using MyChart. We now offer e-Visits for anyone 82 and older to request care online for non-urgent symptoms. For details visit [email or web address].   Also download the MyChart app! Go to the app store, search "MyChart", open the app, select [facility], and log in with your MyChart username and password.

## 2023-08-15 ENCOUNTER — Encounter: Payer: Self-pay | Admitting: Oncology

## 2023-08-16 ENCOUNTER — Ambulatory Visit

## 2023-08-19 ENCOUNTER — Ambulatory Visit
Admission: RE | Admit: 2023-08-19 | Discharge: 2023-08-19 | Disposition: A | Discharge status: Home / Self Care | Source: Ambulatory Visit | Attending: Student in an Organized Health Care Education/Training Program | Admitting: Student in an Organized Health Care Education/Training Program

## 2023-08-19 DIAGNOSIS — C541 Malignant neoplasm of endometrium: Secondary | ICD-10-CM | POA: Diagnosis not present

## 2023-08-19 DIAGNOSIS — R918 Other nonspecific abnormal finding of lung field: Secondary | ICD-10-CM | POA: Diagnosis not present

## 2023-08-19 DIAGNOSIS — D3501 Benign neoplasm of right adrenal gland: Secondary | ICD-10-CM | POA: Diagnosis not present

## 2023-08-19 DIAGNOSIS — R911 Solitary pulmonary nodule: Secondary | ICD-10-CM | POA: Diagnosis not present

## 2023-08-19 DIAGNOSIS — J4540 Moderate persistent asthma, uncomplicated: Secondary | ICD-10-CM | POA: Insufficient documentation

## 2023-08-20 ENCOUNTER — Telehealth: Payer: Self-pay

## 2023-08-20 NOTE — Telephone Encounter (Signed)
 Called and spoke to the pt and she advised that she would have someone come pick it up for her, Or she will pick up at next office visit as she has plenty   Patient assistance meds are available for pick up      Medication: Novofine 32G tip needles   QTY: 3 Box      The boxes have been labeled and placed in designated pick up area     Adelle Hong, CMA

## 2023-08-21 ENCOUNTER — Ambulatory Visit

## 2023-08-21 ENCOUNTER — Inpatient Hospital Stay (HOSPITAL_BASED_OUTPATIENT_CLINIC_OR_DEPARTMENT_OTHER): Admitting: Oncology

## 2023-08-21 ENCOUNTER — Ambulatory Visit: Admitting: Oncology

## 2023-08-21 ENCOUNTER — Other Ambulatory Visit

## 2023-08-21 ENCOUNTER — Inpatient Hospital Stay

## 2023-08-21 ENCOUNTER — Encounter: Payer: Self-pay | Admitting: Oncology

## 2023-08-21 VITALS — BP 134/53 | HR 71 | Temp 97.9°F | Resp 19

## 2023-08-21 VITALS — BP 131/48 | HR 67 | Temp 97.5°F | Resp 16 | Ht 62.0 in | Wt 168.8 lb

## 2023-08-21 DIAGNOSIS — D649 Anemia, unspecified: Secondary | ICD-10-CM

## 2023-08-21 DIAGNOSIS — Z5111 Encounter for antineoplastic chemotherapy: Secondary | ICD-10-CM | POA: Diagnosis not present

## 2023-08-21 DIAGNOSIS — C541 Malignant neoplasm of endometrium: Secondary | ICD-10-CM | POA: Diagnosis not present

## 2023-08-21 LAB — CMP (CANCER CENTER ONLY)
ALT: 13 U/L (ref 0–44)
AST: 18 U/L (ref 15–41)
Albumin: 2.8 g/dL — ABNORMAL LOW (ref 3.5–5.0)
Alkaline Phosphatase: 56 U/L (ref 38–126)
Anion gap: 7 (ref 5–15)
BUN: 18 mg/dL (ref 8–23)
CO2: 27 mmol/L (ref 22–32)
Calcium: 7.9 mg/dL — ABNORMAL LOW (ref 8.9–10.3)
Chloride: 105 mmol/L (ref 98–111)
Creatinine: 0.88 mg/dL (ref 0.44–1.00)
GFR, Estimated: >60 mL/min (ref >60)
Glucose, Bld: 104 mg/dL — ABNORMAL HIGH (ref 70–99)
Potassium: 3.6 mmol/L (ref 3.5–5.1)
Sodium: 139 mmol/L (ref 135–145)
Total Bilirubin: 1 mg/dL (ref 0.0–1.2)
Total Protein: 5 g/dL — ABNORMAL LOW (ref 6.5–8.1)

## 2023-08-21 LAB — CBC WITH DIFFERENTIAL (CANCER CENTER ONLY)
Abs Immature Granulocytes: 0.07 10*3/uL (ref 0.00–0.07)
Basophils Absolute: 0 10*3/uL (ref 0.0–0.1)
Basophils Relative: 0 %
Eosinophils Absolute: 0.1 10*3/uL (ref 0.0–0.5)
Eosinophils Relative: 2 %
HCT: 21.9 % — ABNORMAL LOW (ref 36.0–46.0)
Hemoglobin: 7 g/dL — ABNORMAL LOW (ref 12.0–15.0)
Immature Granulocytes: 2 %
Lymphocytes Relative: 9 %
Lymphs Abs: 0.3 10*3/uL — ABNORMAL LOW (ref 0.7–4.0)
MCH: 28.2 pg (ref 26.0–34.0)
MCHC: 32 g/dL (ref 30.0–36.0)
MCV: 88.3 fL (ref 80.0–100.0)
Monocytes Absolute: 0.2 10*3/uL (ref 0.1–1.0)
Monocytes Relative: 7 %
Neutro Abs: 2.3 10*3/uL (ref 1.7–7.7)
Neutrophils Relative %: 80 %
Platelet Count: 144 10*3/uL — ABNORMAL LOW (ref 150–400)
RBC: 2.48 MIL/uL — ABNORMAL LOW (ref 3.87–5.11)
RDW: 17.5 % — ABNORMAL HIGH (ref 11.5–15.5)
WBC Count: 2.9 10*3/uL — ABNORMAL LOW (ref 4.0–10.5)
nRBC: 2.1 % — ABNORMAL HIGH (ref 0.0–0.2)

## 2023-08-21 LAB — PREPARE RBC (CROSSMATCH)

## 2023-08-21 MED ORDER — FAMOTIDINE IN NACL 20-0.9 MG/50ML-% IV SOLN
20.0000 mg | Freq: Once | INTRAVENOUS | Status: AC
  Administered 2023-08-21: 20 mg via INTRAVENOUS
  Filled 2023-08-21: qty 50

## 2023-08-21 MED ORDER — HEPARIN SOD (PORK) LOCK FLUSH 100 UNIT/ML IV SOLN
500.0000 [IU] | Freq: Once | INTRAVENOUS | Status: AC | PRN
Start: 2023-08-21 — End: 2023-08-21
  Administered 2023-08-21: 500 [IU]
  Filled 2023-08-21: qty 5

## 2023-08-21 MED ORDER — DEXAMETHASONE SODIUM PHOSPHATE 10 MG/ML IJ SOLN
10.0000 mg | Freq: Once | INTRAMUSCULAR | Status: AC
  Administered 2023-08-21: 10 mg via INTRAVENOUS
  Filled 2023-08-21: qty 1

## 2023-08-21 MED ORDER — SODIUM CHLORIDE 0.9 % IV SOLN
80.0000 mg/m2 | Freq: Once | INTRAVENOUS | Status: AC
  Administered 2023-08-21: 150 mg via INTRAVENOUS
  Filled 2023-08-21: qty 25

## 2023-08-21 MED ORDER — DIPHENHYDRAMINE HCL 50 MG/ML IJ SOLN
25.0000 mg | Freq: Once | INTRAMUSCULAR | Status: AC
  Administered 2023-08-21: 25 mg via INTRAVENOUS
  Filled 2023-08-21: qty 1

## 2023-08-21 MED ORDER — SODIUM CHLORIDE 0.9 % IV SOLN
INTRAVENOUS | Status: DC
  Filled 2023-08-21: qty 250

## 2023-08-21 NOTE — Progress Notes (Signed)
  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  INTERVAL HISTORY: Patient returns to clinic today for further evaluation and consideration of cycle 2, day 15 of single agent Taxol .  She continues to have intermittent falls, but typically does not seek medical attention. She continues to have occasional dizziness and chronic peripheral neuropathy.  She does not complain of vaginal bleeding today.  She has no other neurologic complaints.  She denies any recent fevers or illnesses.  She has no chest pain, shortness of breath, cough, or hemoptysis.  She denies any nausea, vomiting, constipation, or diarrhea.  She has no urinary complaints.  Patient offers no further specific complaints today.  REVIEW OF SYSTEMS:   Review of Systems  Constitutional:  Positive for malaise/fatigue. Negative for fever and weight loss.  Respiratory: Negative.  Negative for cough, hemoptysis and shortness of breath.   Cardiovascular: Negative.  Negative for chest pain and leg swelling.  Gastrointestinal: Negative.  Negative for abdominal pain, blood in stool and melena.  Genitourinary: Negative.  Negative for dysuria.  Musculoskeletal:  Positive for falls. Negative for back pain.  Skin: Negative.  Negative for rash.  Neurological:  Positive for dizziness, sensory change and weakness. Negative for focal weakness and headaches.  Psychiatric/Behavioral: Negative.  The patient is not nervous/anxious.      As per HPI. Otherwise, a complete review of systems is negative.  PAST MEDICAL HISTORY: Past Medical History:  Diagnosis Date   Adenomatous colon polyp    Aortic stenosis    Arthritis    Asthma    Cancer (HCC)    Claudication (HCC)    COPD (chronic obstructive pulmonary disease) (HCC)    Depression    Diabetes mellitus without complication (HCC)    Esophageal dysphagia    GERD (gastroesophageal reflux disease)    Hyperlipemia    Hypertension    Obesity    Severe obesity (BMI 35.0-35.9 with comorbidity) (HCC)     PAST SURGICAL HISTORY: Past Surgical History:  Procedure Laterality Date   BLADDER SURGERY     COLONOSCOPY WITH PROPOFOL  N/A 08/05/2015   Procedure: COLONOSCOPY WITH PROPOFOL ;  Surgeon: Luella Sager, MD;  Location: [facility] ENDOSCOPY;  Service: Endoscopy;  Laterality: N/A;   ESOPHAGOGASTRODUODENOSCOPY N/A 01/09/2022   Procedure: ESOPHAGOGASTRODUODENOSCOPY (EGD);  Surgeon: Shane Darling, MD;  Location: [facility] ENDOSCOPY;  Service: Endoscopy;  Laterality: N/A;   FOOT SURGERY Left    FRACTURE SURGERY     IR CV LINE INJECTION  06/22/2022   IR IMAGING GUIDED PORT INSERTION  06/05/2022   IR PORT REPAIR CENTRAL VENOUS ACCESS DEVICE  07/02/2022   LOWER EXTREMITY ANGIOGRAPHY Left 02/04/2023   Procedure: Lower Extremity Angiography;  Surgeon: Celso College, MD;  Location: [facility] INVASIVE CV LAB;  Service: Cardiovascular;  Laterality: Left;   ORIF ANKLE FRACTURE Left 03/26/2017   Procedure: OPEN REDUCTION INTERNAL FIXATION (ORIF) ANKLE FRACTURE;  Surgeon: Elner Hahn, MD;  Location: [facility] ORS;  Service: Orthopedics;  Laterality: Left;    FAMILY HISTORY: Family History  Problem Relation Age of Onset   CVA Mother  Diabetes Mother    CAD Father    Breast cancer Neg Hx     ADVANCED DIRECTIVES (Y/N):  N  HEALTH MAINTENANCE: Social History   Tobacco Use   Smoking status: Former    Current packs/day: 1.00    Average packs/day: 1 pack/day for 40.0 years (40.0 ttl  pk-yrs)    Types: Cigarettes   Smokeless tobacco: Never   Tobacco comments:    Quite 2015  Vaping Use   Vaping status: Never Used  Substance Use Topics   Alcohol use: No   Drug use: No     Colonoscopy:  PAP:  Bone density:  Lipid panel:  No Known Allergies  Current Outpatient Medications  Medication Sig Dispense Refill   albuterol  (VENTOLIN  HFA) 108 (90 Base) MCG/ACT inhaler Inhale 2 puffs into the lungs every 6 (six) hours as needed for wheezing or shortness of breath. 1 each 1   amLODipine  (NORVASC ) 10 MG tablet Take 10 mg by mouth daily.     aspirin  EC 81 MG tablet Take 1 tablet (81 mg total) by mouth daily. Swallow whole. 150 tablet 2   atorvastatin  (LIPITOR) 80 MG tablet Take 80 mg by mouth daily.     buPROPion  (WELLBUTRIN  XL) 300 MG 24 hr tablet TAKE 1 TABLET BY MOUTH DAILY 90 tablet 3   Cholecalciferol  (VITAMIN D3) 50 MCG (2000 UT) capsule Take 2,000 Units by mouth daily.     citalopram  (CELEXA ) 20 MG tablet Take 20 mg by mouth daily.     cloNIDine  (CATAPRES ) 0.2 MG tablet Take 0.2 mg by mouth 2 (two) times daily.     clopidogrel  (PLAVIX ) 75 MG tablet Take 1 tablet (75 mg total) by mouth daily. 90 tablet 3   Continuous Glucose Receiver (FREESTYLE LIBRE 3 READER) DEVI Use to check blood glucose continuously. Dx: E11.8, Z79.4 1 each 0   Continuous Glucose Sensor (FREESTYLE LIBRE 3 PLUS SENSOR) MISC Change sensor every 15 days. E11.8, Z79.4. 6 each 3   cyanocobalamin  (VITAMIN B12) 1000 MCG tablet Take 1 tablet (1,000 mcg total) by mouth daily. 90 tablet 3   doxycycline  (VIBRA -TABS) 100 MG tablet Take 1 tablet (100 mg total) by mouth 2 (two) times daily. 14 tablet 0   fluticasone -salmeterol (WIXELA INHUB) 250-50 MCG/ACT AEPB Inhale 1 puff into the lungs in the morning and at bedtime. 180 each 6   glipiZIDE  (GLUCOTROL  XL) 5 MG 24 hr tablet TAKE 1 TABLET BY MOUTH DAILY 30 tablet 1   insulin  aspart (NOVOLOG ) 100 UNIT/ML injection Inject 7 Units into the skin 3 (three) times daily  before meals.     insulin  degludec (TRESIBA FLEXTOUCH) 100 UNIT/ML FlexTouch Pen Inject 25 Units into the skin daily. 25 units once daily - REPLACES Levemir      Iron , Ferrous Sulfate , 325 (65 Fe) MG TABS Take 325 mg by mouth every other day. 90 tablet 3   losartan -hydrochlorothiazide  (HYZAAR) 100-25 MG tablet Take 1 tablet by mouth daily.     megestrol  (MEGACE ) 40 MG tablet Take 1 tablet (40 mg total) by mouth daily. 30 tablet 2   metoprolol  succinate (TOPROL -XL) 50 MG 24 hr tablet Take 1 tablet by mouth daily.     montelukast  (SINGULAIR ) 10 MG tablet Take 10 mg by mouth at bedtime.     ondansetron  (ZOFRAN ) 8 MG tablet Take 1 tablet (8 mg total) by mouth every 8 (eight) hours as needed for nausea or vomiting. 30 tablet 0   ONETOUCH VERIO test strip 1 each by Other route  3 (three) times daily. 100 each 10   Semaglutide ,0.25 or 0.5MG /DOS, 2 MG/3ML SOPN Inject 0.5 mg into the skin once a week. 3 mL 3   Tiotropium Bromide Monohydrate  (SPIRIVA  RESPIMAT) 2.5 MCG/ACT AERS Inhale 2 puffs into the lungs daily. 60 each 11   No current facility-administered medications for this visit.    OBJECTIVE: Vitals:   08/21/23 1112  BP: (!) 131/48  Pulse: 67  Resp: 16  Temp: (!) 97.5 F (36.4 C)  SpO2: 99%       Body mass index is 30.87 kg/m.    ECOG FS:1 - Symptomatic but completely ambulatory  General: Well-developed, well-nourished, no acute distress. Eyes: Pink conjunctiva, anicteric sclera. HEENT: Normocephalic, moist mucous membranes. Lungs: No audible wheezing or coughing. Heart: Regular rate and rhythm. Abdomen: Soft, nontender, no obvious distention. Musculoskeletal: No edema, cyanosis, or clubbing. Neuro: Alert, answering all questions appropriately. Cranial nerves grossly intact. Skin: No rashes or petechiae noted. Psych: Normal affect.   LAB RESULTS:  Lab Results  Component Value Date   NA 139 08/21/2023   K 3.6 08/21/2023   CL 105 08/21/2023   CO2 27 08/21/2023   GLUCOSE 104  (H) 08/21/2023   BUN 18 08/21/2023   CREATININE 0.88 08/21/2023   CALCIUM  7.9 (L) 08/21/2023   PROT 5.0 (L) 08/21/2023   ALBUMIN 2.8 (L) 08/21/2023   AST 18 08/21/2023   ALT 13 08/21/2023   ALKPHOS 56 08/21/2023   BILITOT 1.0 08/21/2023   GFRNONAA >60 08/21/2023   GFRAA >60 03/27/2017    Lab Results  Component Value Date   WBC 2.9 (L) 08/21/2023   NEUTROABS 2.3 08/21/2023   HGB 7.0 (L) 08/21/2023   HCT 21.9 (L) 08/21/2023   MCV 88.3 08/21/2023   PLT 144 (L) 08/21/2023     STUDIES: No results found.    ONCOLOGY HISTORY: Patient was initially hesitant to undergo chemotherapy and elected to do XRT only which was completed on April 18, 2022.  CT scan results from October 08, 2022 reviewed independently with no obvious evidence of progressive disease. She completed 6 cycles of carboplatinum, Taxol , and Keytruda  on October 03, 2022.  Patient then initiated maintenance Keytruda  on October 23, 2022.  PET scan results from January 15, 2023 reviewed independently with mild nonspecific residual hypermetabolism in the lower uterine segment as well as small bilateral common iliac nodes possibly suggesting nodal metastasis.  Patient was seen by gynecology oncology who determined surgical intervention is not an option.  They recommend discontinuing Keytruda  for progressive disease and initiating single agent Doxil  every 28 days.  Patient received 3 cycles of Doxil  before progression of disease her last dose was given on June 05, 2023.  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  PET scan results from June 14, 2023 reviewed independently with significant progression of disease.  Case discussed with gynecology oncology.  Patient will now receive single agent Taxol  on days 1, 8, and 15 with day 22 off.  Will reimage after 3 cycles.  Can also  consider lenvatinib or possibly gemcitabine in the future.  Proceed with cycle 2, day 15 of treatment today.  Return to clinic tomorrow for blood transfusion and then in 1 week for repeat laboratory can further evaluation.  Patient with then return to clinic in 2 weeks for further evaluation and consideration of cycle 3, day 1. Port: Port revision successful.  Proceed with treatment as above. Hyperglycemia: Patient has improved blood glucose control.   Anemia: Hemoglobin trended down to 7.0.  Return to clinic tomorrow for 2 units packed red blood cells.  Previously, gynecology oncology cauterized several areas of bleeding.  Leukopenia: Mild, monitor.  Patient's total white blood cell count is 2.9. Thrombocytopenia: Mild.  Patient's platelet count is 144. Vaginal bleeding: Patient does not complain of this today.  Secondary to progressive disease.  Follow-up with gynecology oncology as scheduled. Claudication symptoms: Resolved.  Patient underwent revascularization procedure on February 04, 2023. Right hamstring tendon rupture: Patient reports there is no plan for surgical repair. Rash: Resolved.  Likely secondary to Doxil  which has been discontinued.   Poor appetite/weight loss: Continue Megace  as prescribed.  Patient refuses referral to dietary.   Toe ulceration and erythema: Patient completed 7-day course of doxycycline . Neuropathy/dizziness: Patient has an appointment with neurology next week.  Patient expressed understanding and was in agreement with this plan. She also understands that She can call clinic at any time with any questions, concerns, or complaints.    Cancer Staging  Endometrial cancer [facility]) Staging form: Corpus Uteri - Carcinoma and Carcinosarcoma, AJCC 8th Edition - Clinical stage from 05/30/2022: FIGO Stage IIIB (cT3b, cN0, cM0) - Signed by Shellie Dials, MD on 05/30/2022 Stage prefix: Initial diagnosis   Shellie Dials, MD   08/24/2023 6:58 AM

## 2023-08-22 ENCOUNTER — Encounter: Payer: Self-pay | Admitting: *Deleted

## 2023-08-22 ENCOUNTER — Inpatient Hospital Stay

## 2023-08-22 DIAGNOSIS — Z5111 Encounter for antineoplastic chemotherapy: Secondary | ICD-10-CM | POA: Diagnosis not present

## 2023-08-22 DIAGNOSIS — C541 Malignant neoplasm of endometrium: Secondary | ICD-10-CM

## 2023-08-22 MED ORDER — ACETAMINOPHEN 325 MG PO TABS
650.0000 mg | ORAL_TABLET | Freq: Once | ORAL | Status: AC
  Administered 2023-08-22: 650 mg via ORAL
  Filled 2023-08-22: qty 2

## 2023-08-22 MED ORDER — DIPHENHYDRAMINE HCL 50 MG/ML IJ SOLN
25.0000 mg | Freq: Once | INTRAMUSCULAR | Status: AC
Start: 2023-08-22 — End: 2023-08-22
  Administered 2023-08-22: 25 mg via INTRAVENOUS
  Filled 2023-08-22: qty 1

## 2023-08-22 MED ORDER — HEPARIN SOD (PORK) LOCK FLUSH 100 UNIT/ML IV SOLN
250.0000 [IU] | INTRAVENOUS | Status: AC | PRN
  Administered 2023-08-22: 500 [IU]
  Filled 2023-08-22: qty 5

## 2023-08-22 MED ORDER — SODIUM CHLORIDE 0.9% IV SOLUTION
250.0000 mL | INTRAVENOUS | Status: DC
  Administered 2023-08-22: 100 mL via INTRAVENOUS
  Filled 2023-08-22: qty 250

## 2023-08-22 NOTE — Patient Instructions (Signed)

## 2023-08-22 NOTE — Progress Notes (Signed)
 [facility] CSW Progress Note  Visual merchandiser met with patient in infusion to address needs.  Patient admitted to having falls at home due to having several items in her home.  She agreed to have CSW make a referral to [employer] for two free house cleanings.  She also has two cats.  She stated she does not need any assistance with bathing or dressing.  She is able to cook her own meals and declined a Meals on Wheels referral.  Provided active listening and supportive counseling.Kennth Peal, LCSW Clinical Social Worker [facility]

## 2023-08-23 LAB — BPAM RBC
Blood Product Expiration Date: 202505202359
Blood Product Unit Number: 202505202359
ISSUE DATE / TIME: 202504241048
PRODUCT CODE: 202504241234
PRODUCT CODE: 202505202359
Unit Type and Rh: 6200
Unit Type and Rh: 202505202359
Unit Type and Rh: 6200
Unit Type and Rh: 6200

## 2023-08-23 LAB — TYPE AND SCREEN
ABO/RH(D): A POS
Antibody Screen: NEGATIVE
Unit division: 0
Unit division: 0

## 2023-08-24 ENCOUNTER — Encounter: Payer: Self-pay | Admitting: Oncology

## 2023-08-27 ENCOUNTER — Other Ambulatory Visit: Payer: Self-pay | Admitting: *Deleted

## 2023-08-27 DIAGNOSIS — R2681 Unsteadiness on feet: Secondary | ICD-10-CM | POA: Diagnosis not present

## 2023-08-27 DIAGNOSIS — R531 Weakness: Secondary | ICD-10-CM | POA: Diagnosis not present

## 2023-08-27 DIAGNOSIS — J449 Chronic obstructive pulmonary disease, unspecified: Secondary | ICD-10-CM | POA: Diagnosis not present

## 2023-08-27 DIAGNOSIS — E119 Type 2 diabetes mellitus without complications: Secondary | ICD-10-CM | POA: Diagnosis not present

## 2023-08-27 DIAGNOSIS — Z9181 History of falling: Secondary | ICD-10-CM | POA: Diagnosis not present

## 2023-08-27 DIAGNOSIS — R2689 Other abnormalities of gait and mobility: Secondary | ICD-10-CM | POA: Diagnosis not present

## 2023-08-27 DIAGNOSIS — C541 Malignant neoplasm of endometrium: Secondary | ICD-10-CM

## 2023-08-27 DIAGNOSIS — R262 Difficulty in walking, not elsewhere classified: Secondary | ICD-10-CM | POA: Diagnosis not present

## 2023-08-27 DIAGNOSIS — M6281 Muscle weakness (generalized): Secondary | ICD-10-CM | POA: Diagnosis not present

## 2023-08-28 ENCOUNTER — Ambulatory Visit: Admitting: Oncology

## 2023-08-28 ENCOUNTER — Telehealth: Payer: Self-pay

## 2023-08-28 ENCOUNTER — Encounter: Payer: Self-pay | Admitting: Oncology

## 2023-08-28 ENCOUNTER — Encounter

## 2023-08-28 ENCOUNTER — Other Ambulatory Visit

## 2023-08-28 ENCOUNTER — Inpatient Hospital Stay

## 2023-08-28 ENCOUNTER — Inpatient Hospital Stay (HOSPITAL_BASED_OUTPATIENT_CLINIC_OR_DEPARTMENT_OTHER): Admitting: Oncology

## 2023-08-28 DIAGNOSIS — C541 Malignant neoplasm of endometrium: Secondary | ICD-10-CM | POA: Diagnosis not present

## 2023-08-28 DIAGNOSIS — R296 Repeated falls: Secondary | ICD-10-CM | POA: Diagnosis not present

## 2023-08-28 DIAGNOSIS — Z95828 Presence of other vascular implants and grafts: Secondary | ICD-10-CM

## 2023-08-28 DIAGNOSIS — Z5111 Encounter for antineoplastic chemotherapy: Secondary | ICD-10-CM | POA: Diagnosis not present

## 2023-08-28 DIAGNOSIS — R2689 Other abnormalities of gait and mobility: Secondary | ICD-10-CM | POA: Diagnosis not present

## 2023-08-28 DIAGNOSIS — R29898 Other symptoms and signs involving the musculoskeletal system: Secondary | ICD-10-CM | POA: Diagnosis not present

## 2023-08-28 LAB — CBC WITH DIFFERENTIAL/PLATELET
Abs Immature Granulocytes: 0.02 10*3/uL (ref 0.00–0.07)
Basophils Absolute: 0 10*3/uL (ref 0.0–0.1)
Basophils Relative: 1 %
Eosinophils Absolute: 0.1 10*3/uL (ref 0.0–0.5)
Eosinophils Relative: 3 %
HCT: 26.6 % — ABNORMAL LOW (ref 36.0–46.0)
Hemoglobin: 8.5 g/dL — ABNORMAL LOW (ref 12.0–15.0)
Immature Granulocytes: 1 %
Lymphocytes Relative: 11 %
Lymphs Abs: 0.2 10*3/uL — ABNORMAL LOW (ref 0.7–4.0)
MCH: 28.3 pg (ref 26.0–34.0)
MCHC: 32 g/dL (ref 30.0–36.0)
MCV: 88.7 fL (ref 80.0–100.0)
Monocytes Absolute: 0.2 10*3/uL (ref 0.1–1.0)
Monocytes Relative: 10 %
Neutro Abs: 1.4 10*3/uL — ABNORMAL LOW (ref 1.7–7.7)
Neutrophils Relative %: 74 %
Platelets: 153 10*3/uL (ref 150–400)
RBC: 3 MIL/uL — ABNORMAL LOW (ref 3.87–5.11)
RDW: 16.8 % — ABNORMAL HIGH (ref 11.5–15.5)
WBC: 1.8 10*3/uL — ABNORMAL LOW (ref 4.0–10.5)
nRBC: 0 % (ref 0.0–0.2)

## 2023-08-28 LAB — CMP (CANCER CENTER ONLY)
ALT: 12 U/L (ref 0–44)
AST: 14 U/L — ABNORMAL LOW (ref 15–41)
Albumin: 2.8 g/dL — ABNORMAL LOW (ref 3.5–5.0)
Alkaline Phosphatase: 63 U/L (ref 38–126)
Anion gap: 6 (ref 5–15)
BUN: 20 mg/dL (ref 8–23)
CO2: 28 mmol/L (ref 22–32)
Calcium: 8 mg/dL — ABNORMAL LOW (ref 8.9–10.3)
Chloride: 104 mmol/L (ref 98–111)
Creatinine: 1.05 mg/dL — ABNORMAL HIGH (ref 0.44–1.00)
GFR, Estimated: 56 mL/min — ABNORMAL LOW (ref >60)
Glucose, Bld: 210 mg/dL — ABNORMAL HIGH (ref 70–99)
Potassium: 3.9 mmol/L (ref 3.5–5.1)
Sodium: 138 mmol/L (ref 135–145)
Total Bilirubin: 0.8 mg/dL (ref 0.0–1.2)
Total Protein: 5 g/dL — ABNORMAL LOW (ref 6.5–8.1)

## 2023-08-28 LAB — SAMPLE TO BLOOD BANK

## 2023-08-28 MED ORDER — SODIUM CHLORIDE 0.9% FLUSH
10.0000 mL | Freq: Once | INTRAVENOUS | Status: AC
Start: 2023-08-28 — End: 2023-08-28
  Administered 2023-08-28: 10 mL via INTRAVENOUS
  Filled 2023-08-28: qty 10

## 2023-08-28 MED ORDER — HEPARIN SOD (PORK) LOCK FLUSH 100 UNIT/ML IV SOLN
500.0000 [IU] | Freq: Once | INTRAVENOUS | Status: AC
  Administered 2023-08-28: 500 [IU] via INTRAVENOUS
  Filled 2023-08-28: qty 5

## 2023-08-28 NOTE — Telephone Encounter (Signed)
 PAP: Patient assistance application for Novolog , Ozempic , and Tresiba through [employer] has been mailed to [employer] home address on file. Provider portion of application will be faxed to provider's office.

## 2023-08-28 NOTE — Progress Notes (Signed)
 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  INTERVAL HISTORY: Patient returns to clinic today for repeat laboratory work and further evaluation.  She received 2 units of blood last week after her treatments and feels significantly improved.  She continues to have intermittent falls, but typically does not seek medical attention. She continues to have occasional dizziness and chronic peripheral neuropathy and had her appointment with neurology earlier today.  She does not complain of vaginal bleeding today.  She has no other neurologic complaints.  She denies any recent fevers or illnesses.  She has no chest pain, shortness of breath, cough, or hemoptysis.  She denies any nausea, vomiting, constipation, or diarrhea.  She has no urinary complaints.  Patient offers no further specific complaints today.  REVIEW OF SYSTEMS:   Review of Systems  Constitutional:  Positive for malaise/fatigue. Negative for fever and weight loss.  Respiratory: Negative.  Negative for cough, hemoptysis and shortness of breath.   Cardiovascular: Negative.  Negative for chest pain and leg swelling.  Gastrointestinal: Negative.  Negative for abdominal pain, blood in stool and melena.  Genitourinary: Negative.  Negative for dysuria.  Musculoskeletal:  Positive for falls. Negative for back pain.  Skin: Negative.  Negative for rash.  Neurological:  Positive for dizziness, sensory change and weakness.  Negative for focal weakness and headaches.  Psychiatric/Behavioral: Negative.  The patient is not nervous/anxious.     As per HPI. Otherwise, a complete review of systems is negative.  PAST MEDICAL HISTORY: Past Medical History:  Diagnosis Date   Adenomatous colon polyp    Aortic stenosis    Arthritis    Asthma    Cancer (HCC)    Claudication (HCC)    COPD (chronic obstructive pulmonary disease) (HCC)    Depression    Diabetes mellitus without complication (HCC)    Esophageal dysphagia    GERD (gastroesophageal reflux disease)    Hyperlipemia    Hypertension    Obesity    Severe obesity (BMI 35.0-35.9 with comorbidity) (HCC)     PAST SURGICAL HISTORY: Past Surgical History:  Procedure Laterality Date   BLADDER SURGERY     COLONOSCOPY WITH PROPOFOL  N/A 08/05/2015   Procedure: COLONOSCOPY WITH PROPOFOL ;  Surgeon: Luella Sager, MD;  Location: [facility] ENDOSCOPY;  Service: Endoscopy;  Laterality: N/A;   ESOPHAGOGASTRODUODENOSCOPY N/A 01/09/2022   Procedure: ESOPHAGOGASTRODUODENOSCOPY (EGD);  Surgeon: Shane Darling, MD;  Location: [facility] ENDOSCOPY;  Service: Endoscopy;  Laterality: N/A;   FOOT SURGERY Left    FRACTURE SURGERY     IR CV LINE INJECTION  06/22/2022   IR IMAGING GUIDED PORT INSERTION  06/05/2022   IR PORT REPAIR CENTRAL VENOUS ACCESS DEVICE  07/02/2022   LOWER EXTREMITY ANGIOGRAPHY Left 02/04/2023   Procedure: Lower Extremity Angiography;  Surgeon: Celso College, MD;  Location: [facility] INVASIVE CV LAB;  Service: Cardiovascular;  Laterality: Left;   ORIF ANKLE FRACTURE Left 03/26/2017   Procedure: OPEN REDUCTION INTERNAL FIXATION (ORIF) ANKLE FRACTURE;  Surgeon: Elner Hahn, MD;  Location: [facility] ORS;  Service: Orthopedics;  Laterality: Left;  FAMILY HISTORY: Family History  Problem Relation Age of Onset   CVA Mother    Diabetes Mother    CAD Father    Breast cancer Neg Hx     ADVANCED DIRECTIVES (Y/N):  N  HEALTH MAINTENANCE: Social History   Tobacco  Use   Smoking status: Former    Current packs/day: 1.00    Average packs/day: 1 pack/day for 40.0 years (40.0 ttl pk-yrs)    Types: Cigarettes   Smokeless tobacco: Never   Tobacco comments:    Quite 2015  Vaping Use   Vaping status: Never Used  Substance Use Topics   Alcohol use: No   Drug use: No     Colonoscopy:  PAP:  Bone density:  Lipid panel:  No Known Allergies  Current Outpatient Medications  Medication Sig Dispense Refill   albuterol  (VENTOLIN  HFA) 108 (90 Base) MCG/ACT inhaler Inhale 2 puffs into the lungs every 6 (six) hours as needed for wheezing or shortness of breath. 1 each 1   amLODipine  (NORVASC ) 10 MG tablet Take 10 mg by mouth daily.     aspirin  EC 81 MG tablet Take 1 tablet (81 mg total) by mouth daily. Swallow whole. 150 tablet 2   atorvastatin  (LIPITOR) 80 MG tablet Take 80 mg by mouth daily.     buPROPion  (WELLBUTRIN  XL) 300 MG 24 hr tablet TAKE 1 TABLET BY MOUTH DAILY 90 tablet 3   Cholecalciferol  (VITAMIN D3) 50 MCG (2000 UT) capsule Take 2,000 Units by mouth daily.     citalopram  (CELEXA ) 20 MG tablet Take 20 mg by mouth daily.     cloNIDine  (CATAPRES ) 0.2 MG tablet Take 0.2 mg by mouth 2 (two) times daily.     clopidogrel  (PLAVIX ) 75 MG tablet Take 1 tablet (75 mg total) by mouth daily. 90 tablet 3   Continuous Glucose Receiver (FREESTYLE LIBRE 3 READER) DEVI Use to check blood glucose continuously. Dx: E11.8, Z79.4 1 each 0   Continuous Glucose Sensor (FREESTYLE LIBRE 3 PLUS SENSOR) MISC Change sensor every 15 days. E11.8, Z79.4. 6 each 3   cyanocobalamin  (VITAMIN B12) 1000 MCG tablet Take 1 tablet (1,000 mcg total) by mouth daily. 90 tablet 3   doxycycline  (VIBRA -TABS) 100 MG tablet Take 1 tablet (100 mg total) by mouth 2 (two) times daily. 14 tablet 0   fluticasone -salmeterol (WIXELA INHUB) 250-50 MCG/ACT AEPB Inhale 1 puff into the lungs in the morning and at bedtime. 180 each 6   glipiZIDE  (GLUCOTROL  XL) 5 MG 24 hr tablet TAKE 1 TABLET BY MOUTH  DAILY 30 tablet 1   insulin  aspart (NOVOLOG ) 100 UNIT/ML injection Inject 7 Units into the skin 3 (three) times daily before meals.     insulin  degludec (TRESIBA FLEXTOUCH) 100 UNIT/ML FlexTouch Pen Inject 25 Units into the skin daily. 25 units once daily - REPLACES Levemir      Iron , Ferrous Sulfate , 325 (65 Fe) MG TABS Take 325 mg by mouth every other day. 90 tablet 3   losartan -hydrochlorothiazide  (HYZAAR) 100-25 MG tablet Take 1 tablet by mouth daily.     megestrol  (MEGACE ) 40 MG tablet Take 1 tablet (40 mg total) by mouth daily. 30 tablet 2   metoprolol  succinate (TOPROL -XL) 50 MG 24 hr tablet Take 1 tablet by mouth daily.     montelukast  (SINGULAIR ) 10 MG tablet Take 10 mg by mouth at bedtime.     ondansetron  (ZOFRAN ) 8 MG tablet Take 1 tablet (8 mg total) by mouth every 8 (eight) hours as needed for  nausea or vomiting. 30 tablet 0   ONETOUCH VERIO test strip 1 each by Other route 3 (three) times daily. 100 each 10   Semaglutide ,0.25 or 0.5MG /DOS, 2 MG/3ML SOPN Inject 0.5 mg into the skin once a week. 3 mL 3   Tiotropium Bromide Monohydrate  (SPIRIVA  RESPIMAT) 2.5 MCG/ACT AERS Inhale 2 puffs into the lungs daily. 60 each 11   No current facility-administered medications for this visit.    OBJECTIVE: There were no vitals filed for this visit.      There is no height or weight on file to calculate BMI.    ECOG FS:1 - Symptomatic but completely ambulatory  General: Well-developed, well-nourished, no acute distress. Eyes: Pink conjunctiva, anicteric sclera. HEENT: Normocephalic, moist mucous membranes. Lungs: No audible wheezing or coughing. Heart: Regular rate and rhythm. Abdomen: Soft, nontender, no obvious distention. Musculoskeletal: No edema, cyanosis, or clubbing. Neuro: Alert, answering all questions appropriately. Cranial nerves grossly intact. Skin: No rashes or petechiae noted. Psych: Normal affect.   LAB RESULTS:  Lab Results  Component Value Date   NA 138 08/28/2023    K 3.9 08/28/2023   CL 104 08/28/2023   CO2 28 08/28/2023   GLUCOSE 210 (H) 08/28/2023   BUN 20 08/28/2023   CREATININE 1.05 (H) 08/28/2023   CALCIUM  8.0 (L) 08/28/2023   PROT 5.0 (L) 08/28/2023   ALBUMIN 2.8 (L) 08/28/2023   AST 14 (L) 08/28/2023   ALT 12 08/28/2023   ALKPHOS 63 08/28/2023   BILITOT 0.8 08/28/2023   GFRNONAA 56 (L) 08/28/2023   GFRAA >60 03/27/2017    Lab Results  Component Value Date   WBC 1.8 (L) 08/28/2023   NEUTROABS 1.4 (L) 08/28/2023   HGB 8.5 (L) 08/28/2023   HCT 26.6 (L) 08/28/2023   MCV 88.7 08/28/2023   PLT 153 08/28/2023     STUDIES: CT CHEST WO CONTRAST Result Date: 08/28/2023 CLINICAL DATA:  Interstitial lung disease and a history of smoking with asthma and tree-in-bud nodularity on the most recent PET-CT. She is currently on chemotherapy for endometrial cancer. EXAM: CT CHEST WITHOUT CONTRAST TECHNIQUE: Multidetector CT imaging of the chest was performed following the standard protocol without IV contrast. RADIATION DOSE REDUCTION: This exam was performed according to the departmental dose-optimization program which includes automated exposure control, adjustment of the mA and/or kV according to patient size and/or use of iterative reconstruction technique. COMPARISON:  PET CTs dated 06/14/2023 and 01/15/2023, and chest, abdomen and pelvis CT with IV contrast 10/08/2022. FINDINGS: Cardiovascular: Again noted right chest port with IJ approach catheter terminating at the superior cavoatrial junction. The cardiac blood pool is less dense than the myocardium consistent with anemia. The heart is upper limit normal in size. There is heavy calcification in the inferolateral mitral ring, three-vessel coronary calcifications. There are moderate calcifications in the aorta and great vessels but no aortic aneurysm. The pulmonary veins are normal caliber. There is no pericardial effusion. The pulmonary trunk again measuring prominent 3.2 cm indicating arterial  hypertension. Mediastinum/Nodes: Small hiatal hernia. Mild thickening in the distal esophagus which did not show hypermetabolism, probably reflux related. The remaining esophagus is unremarkable. Thyroid  gland, trachea and main bronchi are unremarkable. No intrathoracic or axillary adenopathy is seen without contrast. Lungs/Pleura: Previously there was tree-in-bud interstitial change in the posterior segment right upper lobe, in the posterior aspect of the right middle lobe, and in the lateral basal segment of the right lower lobe. These opacities have cleared in the interval. There is new tree-in-bud micronodularity in  the posterior right lower lobe, coarse interstitial and ground-glass opacity probably a focal pneumonitis in the infrahilar anteromedial right lower lobe, and new coarsely nodular opacities in the posteromedial extreme right lower lobe base with the largest individual nodule 1.7 cm on 4:134. Given the short interval time frame of the appearance of this nodularity I suspect these are inflammatory nodules. Other satellite nodules are present above and medial to this. Largest of these measuring 9 mm on 4:123. There is diffuse bronchial thickening, greatest in the right lower lobe. There are occasional calcified granulomas in the left lung. Additional new tree-in-bud micronodular disease in the superior segment of the left lower lobe. Remainder of the lungs appear generally clear. There is no pleural effusion thorax. Upper Abdomen: No acute abnormality. Abdominal aortic atherosclerosis. Adjacent stable right adrenal adenomas are again noted measuring 1.7 cm and -15 Hounsfield units, and 1.3 cm and -4 Hounsfield units. Left adrenal gland is unremarkable. The visualized liver, gallbladder bile ducts are unremarkable. Musculoskeletal: Osteopenia. There is mild chronic anterior wedge compression deformity of the T5, T10, and T11 vertebrae. No acute or progressive compression fracture is seen. There are  multilevel healed fractures of the lateral right ribs. No acute or other significant osseous findings. No chest wall mass is seen. IMPRESSION: 1. Interval clearing of previously seen tree-in-bud interstitial opacities in the right upper lobe, right middle lobe, and right lower lobe. 2. New tree-in-bud micronodular disease in the posterior right lower lobe, superior segment left lower lobe, and coarse interstitial and ground-glass opacity in the infrahilar anteromedial right lower lobe. Atypical infectious process favored. 3. New coarsely nodular opacities in the posteromedial extreme right lower lobe base with the largest individual nodule 1.7 cm. Given the short interval time frame of the appearance of these nodules I suspect these are inflammatory nodules. A short interval follow-up CT or PET-CT is recommended given the patient's cancer history, such as in 6-8 weeks. 4. Diffuse bronchial thickening, greatest in the right lower lobe. 5. Aortic and coronary artery atherosclerosis. 6. Prominent pulmonary trunk indicating arterial hypertension. 7. Small hiatal hernia with mild thickening in the distal esophagus, probably reflux related. 8. Stable right adrenal adenomas. 9. Osteopenia and chronic compression deformities. No acute or progressive compression fracture is seen. Aortic Atherosclerosis (ICD10-I70.0). Electronically Signed   By: Denman Fischer M.D.   On: 08/28/2023 00:21      ONCOLOGY HISTORY: Patient was initially hesitant to undergo chemotherapy and elected to do XRT only which was completed on April 18, 2022.  CT scan results from October 08, 2022 reviewed independently with no obvious evidence of progressive disease. She completed 6 cycles of carboplatinum, Taxol , and Keytruda  on October 03, 2022.  Patient then initiated maintenance Keytruda  on October 23, 2022.  PET scan results from January 15, 2023 reviewed independently with mild nonspecific residual hypermetabolism in the lower uterine segment as  well as small bilateral common iliac nodes possibly suggesting nodal metastasis.  Patient was seen by gynecology oncology who determined surgical intervention is not an option.  They recommend discontinuing Keytruda  for progressive disease and initiating single agent Doxil  every 28 days.  Patient received 3 cycles of Doxil  before progression of disease her last dose was given on June 05, 2023.  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  PET scan results from June 14, 2023 reviewed independently with significant progression of disease.  Case discussed  with gynecology oncology.  Patient will now receive single agent Taxol  on days 1, 8, and 15 with day 22 off.  Will reimage after 3 cycles.  Can also consider lenvatinib or possibly gemcitabine in the future.  Patient received cycle 2, day 15 of treatment last week.  Return to clinic in 1 week for further evaluation and consideration of cycle 3, day 1.   Port: Port revision successful.  Proceed with treatment as above. Hyperglycemia: Patient has improved blood glucose control.   Anemia: Hemoglobin improved to 8.5 with 2 units packed red blood cells last week.  She does not require transfusion this week.  Previously, gynecology oncology cauterized several areas of bleeding.  Leukopenia: Total white blood cell count is declining 1.8.  Monitor.   Thrombocytopenia: Resolved. Vaginal bleeding: Patient does not complain of this today.  Secondary to progressive disease.  Follow-up with gynecology oncology as scheduled. Claudication symptoms: Resolved.  Patient underwent revascularization procedure on February 04, 2023. Right hamstring tendon rupture: Patient reports there is no plan for surgical repair. Rash: Resolved.  Likely secondary to Doxil  which has been discontinued.   Poor appetite/weight loss:  Continue Megace  as prescribed.  Patient refuses referral to dietary.   Toe ulceration and erythema: Patient completed 7-day course of doxycycline . Neuropathy/dizziness: Patient had an appointment with neurology earlier today. Falls: Intermittent.  Patient now has 2 walkers at her home upstairs and downstairs.  She admits to only using them occasionally.  Patient expressed understanding and was in agreement with this plan. She also understands that She can call clinic at any time with any questions, concerns, or complaints.    Cancer Staging  Endometrial cancer [facility]) Staging form: Corpus Uteri - Carcinoma and Carcinosarcoma, AJCC 8th Edition - Clinical stage from 05/30/2022: FIGO Stage IIIB (cT3b, cN0, cM0) - Signed by Shellie Dials, MD on 05/30/2022 Stage prefix: Initial diagnosis   Shellie Dials, MD   08/28/2023 12:32 PM

## 2023-08-28 NOTE — Progress Notes (Signed)
 Patient is doing about the same, she says that she is still feeling tired, but not as bad as it was. She had a CT scan on 08/19/2023.

## 2023-08-29 ENCOUNTER — Other Ambulatory Visit: Payer: Self-pay | Admitting: Student

## 2023-08-29 ENCOUNTER — Inpatient Hospital Stay

## 2023-08-29 DIAGNOSIS — R29898 Other symptoms and signs involving the musculoskeletal system: Secondary | ICD-10-CM

## 2023-09-02 ENCOUNTER — Encounter: Payer: Self-pay | Admitting: Student

## 2023-09-02 DIAGNOSIS — C541 Malignant neoplasm of endometrium: Secondary | ICD-10-CM | POA: Diagnosis not present

## 2023-09-02 DIAGNOSIS — Z9181 History of falling: Secondary | ICD-10-CM | POA: Diagnosis not present

## 2023-09-02 DIAGNOSIS — Z7984 Long term (current) use of oral hypoglycemic drugs: Secondary | ICD-10-CM | POA: Diagnosis not present

## 2023-09-02 DIAGNOSIS — J4489 Other specified chronic obstructive pulmonary disease: Secondary | ICD-10-CM | POA: Diagnosis not present

## 2023-09-02 DIAGNOSIS — Z7982 Long term (current) use of aspirin: Secondary | ICD-10-CM | POA: Diagnosis not present

## 2023-09-02 DIAGNOSIS — Z7902 Long term (current) use of antithrombotics/antiplatelets: Secondary | ICD-10-CM | POA: Diagnosis not present

## 2023-09-02 DIAGNOSIS — Z794 Long term (current) use of insulin: Secondary | ICD-10-CM | POA: Diagnosis not present

## 2023-09-02 DIAGNOSIS — K219 Gastro-esophageal reflux disease without esophagitis: Secondary | ICD-10-CM | POA: Diagnosis not present

## 2023-09-02 DIAGNOSIS — Z87891 Personal history of nicotine dependence: Secondary | ICD-10-CM | POA: Diagnosis not present

## 2023-09-02 DIAGNOSIS — E785 Hyperlipidemia, unspecified: Secondary | ICD-10-CM | POA: Diagnosis not present

## 2023-09-02 DIAGNOSIS — F32A Depression, unspecified: Secondary | ICD-10-CM | POA: Diagnosis not present

## 2023-09-02 DIAGNOSIS — I10 Essential (primary) hypertension: Secondary | ICD-10-CM | POA: Diagnosis not present

## 2023-09-02 DIAGNOSIS — E1151 Type 2 diabetes mellitus with diabetic peripheral angiopathy without gangrene: Secondary | ICD-10-CM | POA: Diagnosis not present

## 2023-09-02 DIAGNOSIS — R1319 Other dysphagia: Secondary | ICD-10-CM | POA: Diagnosis not present

## 2023-09-02 DIAGNOSIS — Z860100 Personal history of colon polyps, unspecified: Secondary | ICD-10-CM | POA: Diagnosis not present

## 2023-09-02 DIAGNOSIS — I350 Nonrheumatic aortic (valve) stenosis: Secondary | ICD-10-CM | POA: Diagnosis not present

## 2023-09-02 DIAGNOSIS — E1140 Type 2 diabetes mellitus with diabetic neuropathy, unspecified: Secondary | ICD-10-CM | POA: Diagnosis not present

## 2023-09-02 DIAGNOSIS — J439 Emphysema, unspecified: Secondary | ICD-10-CM | POA: Diagnosis not present

## 2023-09-02 DIAGNOSIS — M5030 Other cervical disc degeneration, unspecified cervical region: Secondary | ICD-10-CM | POA: Diagnosis not present

## 2023-09-04 ENCOUNTER — Inpatient Hospital Stay (HOSPITAL_BASED_OUTPATIENT_CLINIC_OR_DEPARTMENT_OTHER): Admitting: Oncology

## 2023-09-04 ENCOUNTER — Encounter: Payer: Self-pay | Admitting: Oncology

## 2023-09-04 ENCOUNTER — Ambulatory Visit

## 2023-09-04 ENCOUNTER — Inpatient Hospital Stay

## 2023-09-04 ENCOUNTER — Other Ambulatory Visit

## 2023-09-04 ENCOUNTER — Inpatient Hospital Stay: Attending: Obstetrics and Gynecology

## 2023-09-04 ENCOUNTER — Ambulatory Visit: Admitting: Oncology

## 2023-09-04 ENCOUNTER — Encounter

## 2023-09-04 VITALS — BP 130/50 | HR 69 | Temp 97.6°F | Resp 14 | Wt 163.0 lb

## 2023-09-04 VITALS — BP 142/53 | HR 73 | Resp 18

## 2023-09-04 DIAGNOSIS — Z87891 Personal history of nicotine dependence: Secondary | ICD-10-CM | POA: Diagnosis not present

## 2023-09-04 DIAGNOSIS — C541 Malignant neoplasm of endometrium: Secondary | ICD-10-CM

## 2023-09-04 DIAGNOSIS — E1165 Type 2 diabetes mellitus with hyperglycemia: Secondary | ICD-10-CM | POA: Diagnosis not present

## 2023-09-04 DIAGNOSIS — D649 Anemia, unspecified: Secondary | ICD-10-CM | POA: Diagnosis not present

## 2023-09-04 DIAGNOSIS — Z5111 Encounter for antineoplastic chemotherapy: Secondary | ICD-10-CM | POA: Diagnosis not present

## 2023-09-04 LAB — CMP (CANCER CENTER ONLY)
ALT: 12 U/L (ref 0–44)
AST: 15 U/L (ref 15–41)
Albumin: 3.1 g/dL — ABNORMAL LOW (ref 3.5–5.0)
Alkaline Phosphatase: 77 U/L (ref 38–126)
Anion gap: 9 (ref 5–15)
BUN: 17 mg/dL (ref 8–23)
CO2: 28 mmol/L (ref 22–32)
Calcium: 8.3 mg/dL — ABNORMAL LOW (ref 8.9–10.3)
Chloride: 103 mmol/L (ref 98–111)
Creatinine: 1 mg/dL (ref 0.44–1.00)
GFR, Estimated: 59 mL/min — ABNORMAL LOW (ref >60)
Glucose, Bld: 129 mg/dL — ABNORMAL HIGH (ref 70–99)
Potassium: 4 mmol/L (ref 3.5–5.1)
Sodium: 140 mmol/L (ref 135–145)
Total Bilirubin: 0.7 mg/dL (ref 0.0–1.2)
Total Protein: 5.6 g/dL — ABNORMAL LOW (ref 6.5–8.1)

## 2023-09-04 LAB — SAMPLE TO BLOOD BANK

## 2023-09-04 LAB — CBC WITH DIFFERENTIAL (CANCER CENTER ONLY)
Abs Immature Granulocytes: 0.03 10*3/uL (ref 0.00–0.07)
Basophils Absolute: 0 10*3/uL (ref 0.0–0.1)
Basophils Relative: 1 %
Eosinophils Absolute: 0.1 10*3/uL (ref 0.0–0.5)
Eosinophils Relative: 2 %
HCT: 32.1 % — ABNORMAL LOW (ref 36.0–46.0)
Hemoglobin: 10.2 g/dL — ABNORMAL LOW (ref 12.0–15.0)
Immature Granulocytes: 1 %
Lymphocytes Relative: 7 %
Lymphs Abs: 0.3 10*3/uL — ABNORMAL LOW (ref 0.7–4.0)
MCH: 28.1 pg (ref 26.0–34.0)
MCHC: 31.8 g/dL (ref 30.0–36.0)
MCV: 88.4 fL (ref 80.0–100.0)
Monocytes Absolute: 0.6 10*3/uL (ref 0.1–1.0)
Monocytes Relative: 14 %
Neutro Abs: 3.5 10*3/uL (ref 1.7–7.7)
Neutrophils Relative %: 75 %
Platelet Count: 229 10*3/uL (ref 150–400)
RBC: 3.63 MIL/uL — ABNORMAL LOW (ref 3.87–5.11)
RDW: 18 % — ABNORMAL HIGH (ref 11.5–15.5)
WBC Count: 4.5 10*3/uL (ref 4.0–10.5)
nRBC: 0 % (ref 0.0–0.2)

## 2023-09-04 MED ORDER — SODIUM CHLORIDE 0.9 % IV SOLN
INTRAVENOUS | Status: DC
  Filled 2023-09-04: qty 250

## 2023-09-04 MED ORDER — DIPHENHYDRAMINE HCL 50 MG/ML IJ SOLN
25.0000 mg | Freq: Once | INTRAMUSCULAR | Status: AC
  Administered 2023-09-04: 25 mg via INTRAVENOUS
  Filled 2023-09-04: qty 1

## 2023-09-04 MED ORDER — SODIUM CHLORIDE 0.9 % IV SOLN
80.0000 mg/m2 | Freq: Once | INTRAVENOUS | Status: AC
  Administered 2023-09-04: 150 mg via INTRAVENOUS
  Filled 2023-09-04: qty 25

## 2023-09-04 MED ORDER — HEPARIN SOD (PORK) LOCK FLUSH 100 UNIT/ML IV SOLN
500.0000 [IU] | Freq: Once | INTRAVENOUS | Status: AC | PRN
Start: 2023-09-04 — End: 2023-09-04
  Administered 2023-09-04: 500 [IU]
  Filled 2023-09-04: qty 5

## 2023-09-04 MED ORDER — DEXAMETHASONE SODIUM PHOSPHATE 10 MG/ML IJ SOLN
10.0000 mg | Freq: Once | INTRAMUSCULAR | Status: AC
  Administered 2023-09-04: 10 mg via INTRAVENOUS
  Filled 2023-09-04: qty 1

## 2023-09-04 MED ORDER — FAMOTIDINE IN NACL 20-0.9 MG/50ML-% IV SOLN
20.0000 mg | Freq: Once | INTRAVENOUS | Status: AC
  Administered 2023-09-04: 20 mg via INTRAVENOUS
  Filled 2023-09-04: qty 50

## 2023-09-04 NOTE — Patient Instructions (Signed)
 [facility] - A DEPT OF [facility]  Discharge Instructions: Thank you for choosing [facility] to provide your oncology and hematology care.  If you have a lab appointment with the Cancer Center, please go directly to the Cancer Center and check in at the registration area.  Wear comfortable clothing and clothing appropriate for easy access to any Portacath or PICC line.   We strive to give you quality time with your provider. You may need to reschedule your appointment if you arrive late (15 or more minutes).  Arriving late affects you and other patients whose appointments are after yours.  Also, if you miss three or more appointments without notifying the office, you may be dismissed from the clinic at the provider's discretion.      For prescription refill requests, have your pharmacy contact our office and allow 72 hours for refills to be completed.    Today you received the following chemotherapy and/or immunotherapy agents Taxol.      To help prevent nausea and vomiting after your treatment, we encourage you to take your nausea medication as directed.  BELOW ARE SYMPTOMS THAT SHOULD BE REPORTED IMMEDIATELY: *FEVER GREATER THAN 100.4 F (38 C) OR HIGHER *CHILLS OR SWEATING *NAUSEA AND VOMITING THAT IS NOT CONTROLLED WITH YOUR NAUSEA MEDICATION *UNUSUAL SHORTNESS OF BREATH *UNUSUAL BRUISING OR BLEEDING *URINARY PROBLEMS (pain or burning when urinating, or frequent urination) *BOWEL PROBLEMS (unusual diarrhea, constipation, pain near the anus) TENDERNESS IN MOUTH AND THROAT WITH OR WITHOUT PRESENCE OF ULCERS (sore throat, sores in mouth, or a toothache) UNUSUAL RASH, SWELLING OR PAIN  UNUSUAL VAGINAL DISCHARGE OR ITCHING   Items with * indicate a potential emergency and should be followed up as soon as possible or go to the Emergency Department if any problems should occur.  Please show the CHEMOTHERAPY ALERT CARD or IMMUNOTHERAPY ALERT  CARD at check-in to the Emergency Department and triage nurse.  Should you have questions after your visit or need to cancel or reschedule your appointment, please contact [facility] - A DEPT OF [facility]  [phone] and follow the prompts.  Office hours are 8:00 a.m. to 4:30 p.m. Monday - Friday. Please note that voicemails left after 4:00 p.m. may not be returned until the following business day.  We are closed weekends and major holidays. You have access to a nurse at all times for urgent questions. Please call the main number to the clinic [phone] and follow the prompts.  For any non-urgent questions, you may also contact your provider using MyChart. We now offer e-Visits for anyone 82 and older to request care online for non-urgent symptoms. For details visit [email or web address].   Also download the MyChart app! Go to the app store, search "MyChart", open the app, select [facility], and log in with your MyChart username and password.

## 2023-09-04 NOTE — Progress Notes (Signed)
 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  INTERVAL HISTORY: Patient returns to clinic today for repeat laboratory work and consideration of cycle 3, day 1 of single agent Taxol .  She cannot feels well.  She denies any further falls this week.  She has chronic weakness and fatigue.  She continues to have occasional dizziness and chronic peripheral neuropathy.  She does not complain of vaginal bleeding today.  She has no other neurologic complaints.  She denies any recent fevers or illnesses.  She has no chest pain, shortness of breath, cough, or hemoptysis.  She denies any nausea, vomiting, constipation, or diarrhea.  She has no urinary complaints.  Patient offers no further specific complaints today.  REVIEW OF SYSTEMS:   Review of Systems  Constitutional:  Positive for malaise/fatigue. Negative for fever and weight loss.  Respiratory: Negative.  Negative for cough, hemoptysis and shortness of breath.   Cardiovascular: Negative.  Negative for chest pain and leg swelling.  Gastrointestinal: Negative.  Negative for abdominal pain, blood in stool and melena.  Genitourinary: Negative.  Negative for dysuria.  Musculoskeletal: Negative.  Negative for back pain and falls.  Skin: Negative.  Negative for rash.  Neurological:  Positive for dizziness, sensory change and weakness. Negative for focal weakness and headaches.  Psychiatric/Behavioral: Negative.  The patient is not  nervous/anxious.     As per HPI. Otherwise, a complete review of systems is negative.  PAST MEDICAL HISTORY: Past Medical History:  Diagnosis Date   Adenomatous colon polyp    Aortic stenosis    Arthritis    Asthma    Cancer (HCC)    Claudication (HCC)    COPD (chronic obstructive pulmonary disease) (HCC)    Depression    Diabetes mellitus without complication (HCC)    Esophageal dysphagia    GERD (gastroesophageal reflux disease)    Hyperlipemia    Hypertension    Obesity    Severe obesity (BMI 35.0-35.9 with comorbidity) (HCC)     PAST SURGICAL HISTORY: Past Surgical History:  Procedure Laterality Date   BLADDER SURGERY     COLONOSCOPY WITH PROPOFOL  N/A 08/05/2015   Procedure: COLONOSCOPY WITH PROPOFOL ;  Surgeon: Luella Sager, MD;  Location: [facility] ENDOSCOPY;  Service: Endoscopy;  Laterality: N/A;   ESOPHAGOGASTRODUODENOSCOPY N/A 01/09/2022   Procedure: ESOPHAGOGASTRODUODENOSCOPY (EGD);  Surgeon: Shane Darling, MD;  Location: [facility] ENDOSCOPY;  Service: Endoscopy;  Laterality: N/A;   FOOT SURGERY Left    FRACTURE SURGERY     IR CV LINE INJECTION  06/22/2022   IR IMAGING GUIDED PORT INSERTION  06/05/2022   IR PORT REPAIR CENTRAL VENOUS ACCESS DEVICE  07/02/2022   LOWER EXTREMITY ANGIOGRAPHY Left 02/04/2023   Procedure: Lower Extremity Angiography;  Surgeon: Celso College, MD;  Location: [facility] INVASIVE CV LAB;  Service: Cardiovascular;  Laterality: Left;   ORIF ANKLE FRACTURE Left 03/26/2017   Procedure: OPEN REDUCTION INTERNAL FIXATION (ORIF) ANKLE FRACTURE;  Surgeon: Elner Hahn, MD;  Location: [facility] ORS;  Service: Orthopedics;  Laterality: Left;    FAMILY HISTORY: Family History  Problem Relation Age of  Onset   CVA Mother    Diabetes Mother    CAD Father    Breast cancer Neg Hx     ADVANCED DIRECTIVES (Y/N):  N  HEALTH MAINTENANCE: Social History   Tobacco Use   Smoking status: Former    Current packs/day: 1.00    Average packs/day: 1 pack/day for 40.0  years (40.0 ttl pk-yrs)    Types: Cigarettes   Smokeless tobacco: Never   Tobacco comments:    Quite 2015  Vaping Use   Vaping status: Never Used  Substance Use Topics   Alcohol use: No   Drug use: No     Colonoscopy:  PAP:  Bone density:  Lipid panel:  No Known Allergies  Current Outpatient Medications  Medication Sig Dispense Refill   albuterol  (VENTOLIN  HFA) 108 (90 Base) MCG/ACT inhaler Inhale 2 puffs into the lungs every 6 (six) hours as needed for wheezing or shortness of breath. 1 each 1   amLODipine  (NORVASC ) 10 MG tablet Take 10 mg by mouth daily.     aspirin  EC 81 MG tablet Take 1 tablet (81 mg total) by mouth daily. Swallow whole. 150 tablet 2   atorvastatin  (LIPITOR) 80 MG tablet Take 80 mg by mouth daily.     buPROPion  (WELLBUTRIN  XL) 300 MG 24 hr tablet TAKE 1 TABLET BY MOUTH DAILY 90 tablet 3   Cholecalciferol  (VITAMIN D3) 50 MCG (2000 UT) capsule Take 2,000 Units by mouth daily.     citalopram  (CELEXA ) 20 MG tablet Take 20 mg by mouth daily.     cloNIDine  (CATAPRES ) 0.2 MG tablet Take 0.2 mg by mouth 2 (two) times daily.     clopidogrel  (PLAVIX ) 75 MG tablet Take 1 tablet (75 mg total) by mouth daily. 90 tablet 3   Continuous Glucose Receiver (FREESTYLE LIBRE 3 READER) DEVI Use to check blood glucose continuously. Dx: E11.8, Z79.4 1 each 0   Continuous Glucose Sensor (FREESTYLE LIBRE 3 PLUS SENSOR) MISC Change sensor every 15 days. E11.8, Z79.4. 6 each 3   cyanocobalamin  (VITAMIN B12) 1000 MCG tablet Take 1 tablet (1,000 mcg total) by mouth daily. 90 tablet 3   doxycycline  (VIBRA -TABS) 100 MG tablet Take 1 tablet (100 mg total) by mouth 2 (two) times daily. 14 tablet 0   fluticasone -salmeterol (WIXELA INHUB) 250-50 MCG/ACT AEPB Inhale 1 puff into the lungs in the morning and at bedtime. 180 each 6   glipiZIDE  (GLUCOTROL  XL) 5 MG 24 hr tablet TAKE 1 TABLET BY MOUTH DAILY 30 tablet 1   insulin  aspart (NOVOLOG ) 100 UNIT/ML injection Inject 7 Units into the skin 3  (three) times daily before meals.     insulin  degludec (TRESIBA FLEXTOUCH) 100 UNIT/ML FlexTouch Pen Inject 25 Units into the skin daily. 25 units once daily - REPLACES Levemir      Iron , Ferrous Sulfate , 325 (65 Fe) MG TABS Take 325 mg by mouth every other day. 90 tablet 3   losartan -hydrochlorothiazide  (HYZAAR) 100-25 MG tablet Take 1 tablet by mouth daily.     megestrol  (MEGACE ) 40 MG tablet Take 1 tablet (40 mg total) by mouth daily. 30 tablet 2   metoprolol  succinate (TOPROL -XL) 50 MG 24 hr tablet Take 1 tablet by mouth daily.     montelukast  (SINGULAIR ) 10 MG tablet Take 10 mg by mouth at bedtime.     ondansetron  (ZOFRAN ) 8 MG tablet Take 1 tablet (8 mg total) by mouth every 8 (eight) hours as needed for nausea or vomiting. 30 tablet 0   ONETOUCH  VERIO test strip 1 each by Other route 3 (three) times daily. 100 each 10   Semaglutide ,0.25 or 0.5MG /DOS, 2 MG/3ML SOPN Inject 0.5 mg into the skin once a week. 3 mL 3   Tiotropium Bromide Monohydrate  (SPIRIVA  RESPIMAT) 2.5 MCG/ACT AERS Inhale 2 puffs into the lungs daily. 60 each 11   No current facility-administered medications for this visit.    OBJECTIVE: Vitals:   09/04/23 1052  BP: (!) 130/50  Pulse: 69  Resp: 14  Temp: 97.6 F (36.4 C)  SpO2: 96%       Body mass index is 29.81 kg/m.    ECOG FS:1 - Symptomatic but completely ambulatory  General: Well-developed, well-nourished, no acute distress.  Sitting in a wheelchair. Eyes: Pink conjunctiva, anicteric sclera. HEENT: Normocephalic, moist mucous membranes. Lungs: No audible wheezing or coughing. Heart: Regular rate and rhythm. Abdomen: Soft, nontender, no obvious distention. Musculoskeletal: No edema, cyanosis, or clubbing. Neuro: Alert, answering all questions appropriately. Cranial nerves grossly intact. Skin: No rashes or petechiae noted. Psych: Normal affect.   LAB RESULTS:  Lab Results  Component Value Date   NA 140 09/04/2023   K 4.0 09/04/2023   CL 103  09/04/2023   CO2 28 09/04/2023   GLUCOSE 129 (H) 09/04/2023   BUN 17 09/04/2023   CREATININE 1.00 09/04/2023   CALCIUM  8.3 (L) 09/04/2023   PROT 5.6 (L) 09/04/2023   ALBUMIN 3.1 (L) 09/04/2023   AST 15 09/04/2023   ALT 12 09/04/2023   ALKPHOS 77 09/04/2023   BILITOT 0.7 09/04/2023   GFRNONAA 59 (L) 09/04/2023   GFRAA >60 03/27/2017    Lab Results  Component Value Date   WBC 4.5 09/04/2023   NEUTROABS 3.5 09/04/2023   HGB 10.2 (L) 09/04/2023   HCT 32.1 (L) 09/04/2023   MCV 88.4 09/04/2023   PLT 229 09/04/2023     STUDIES: CT CHEST WO CONTRAST Result Date: 08/28/2023 CLINICAL DATA:  Interstitial lung disease and a history of smoking with asthma and tree-in-bud nodularity on the most recent PET-CT. She is currently on chemotherapy for endometrial cancer. EXAM: CT CHEST WITHOUT CONTRAST TECHNIQUE: Multidetector CT imaging of the chest was performed following the standard protocol without IV contrast. RADIATION DOSE REDUCTION: This exam was performed according to the departmental dose-optimization program which includes automated exposure control, adjustment of the mA and/or kV according to patient size and/or use of iterative reconstruction technique. COMPARISON:  PET CTs dated 06/14/2023 and 01/15/2023, and chest, abdomen and pelvis CT with IV contrast 10/08/2022. FINDINGS: Cardiovascular: Again noted right chest port with IJ approach catheter terminating at the superior cavoatrial junction. The cardiac blood pool is less dense than the myocardium consistent with anemia. The heart is upper limit normal in size. There is heavy calcification in the inferolateral mitral ring, three-vessel coronary calcifications. There are moderate calcifications in the aorta and great vessels but no aortic aneurysm. The pulmonary veins are normal caliber. There is no pericardial effusion. The pulmonary trunk again measuring prominent 3.2 cm indicating arterial hypertension. Mediastinum/Nodes: Small hiatal  hernia. Mild thickening in the distal esophagus which did not show hypermetabolism, probably reflux related. The remaining esophagus is unremarkable. Thyroid  gland, trachea and main bronchi are unremarkable. No intrathoracic or axillary adenopathy is seen without contrast. Lungs/Pleura: Previously there was tree-in-bud interstitial change in the posterior segment right upper lobe, in the posterior aspect of the right middle lobe, and in the lateral basal segment of the right lower lobe. These opacities have cleared in the interval. There is  new tree-in-bud micronodularity in the posterior right lower lobe, coarse interstitial and ground-glass opacity probably a focal pneumonitis in the infrahilar anteromedial right lower lobe, and new coarsely nodular opacities in the posteromedial extreme right lower lobe base with the largest individual nodule 1.7 cm on 4:134. Given the short interval time frame of the appearance of this nodularity I suspect these are inflammatory nodules. Other satellite nodules are present above and medial to this. Largest of these measuring 9 mm on 4:123. There is diffuse bronchial thickening, greatest in the right lower lobe. There are occasional calcified granulomas in the left lung. Additional new tree-in-bud micronodular disease in the superior segment of the left lower lobe. Remainder of the lungs appear generally clear. There is no pleural effusion thorax. Upper Abdomen: No acute abnormality. Abdominal aortic atherosclerosis. Adjacent stable right adrenal adenomas are again noted measuring 1.7 cm and -15 Hounsfield units, and 1.3 cm and -4 Hounsfield units. Left adrenal gland is unremarkable. The visualized liver, gallbladder bile ducts are unremarkable. Musculoskeletal: Osteopenia. There is mild chronic anterior wedge compression deformity of the T5, T10, and T11 vertebrae. No acute or progressive compression fracture is seen. There are multilevel healed fractures of the lateral right  ribs. No acute or other significant osseous findings. No chest wall mass is seen. IMPRESSION: 1. Interval clearing of previously seen tree-in-bud interstitial opacities in the right upper lobe, right middle lobe, and right lower lobe. 2. New tree-in-bud micronodular disease in the posterior right lower lobe, superior segment left lower lobe, and coarse interstitial and ground-glass opacity in the infrahilar anteromedial right lower lobe. Atypical infectious process favored. 3. New coarsely nodular opacities in the posteromedial extreme right lower lobe base with the largest individual nodule 1.7 cm. Given the short interval time frame of the appearance of these nodules I suspect these are inflammatory nodules. A short interval follow-up CT or PET-CT is recommended given the patient's cancer history, such as in 6-8 weeks. 4. Diffuse bronchial thickening, greatest in the right lower lobe. 5. Aortic and coronary artery atherosclerosis. 6. Prominent pulmonary trunk indicating arterial hypertension. 7. Small hiatal hernia with mild thickening in the distal esophagus, probably reflux related. 8. Stable right adrenal adenomas. 9. Osteopenia and chronic compression deformities. No acute or progressive compression fracture is seen. Aortic Atherosclerosis (ICD10-I70.0). Electronically Signed   By: Denman Fischer M.D.   On: 08/28/2023 00:21      ONCOLOGY HISTORY: Patient was initially hesitant to undergo chemotherapy and elected to do XRT only which was completed on April 18, 2022.  CT scan results from October 08, 2022 reviewed independently with no obvious evidence of progressive disease. She completed 6 cycles of carboplatinum, Taxol , and Keytruda  on October 03, 2022.  Patient then initiated maintenance Keytruda  on October 23, 2022.  PET scan results from January 15, 2023 reviewed independently with mild nonspecific residual hypermetabolism in the lower uterine segment as well as small bilateral common iliac nodes possibly  suggesting nodal metastasis.  Patient was seen by gynecology oncology who determined surgical intervention is not an option.  They recommend discontinuing Keytruda  for progressive disease and initiating single agent Doxil  every 28 days.  Patient received 3 cycles of Doxil  before progression of disease her last dose was given on June 05, 2023.  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  PET scan results from June 14, 2023 reviewed independently with significant progression of  disease.  Case discussed with gynecology oncology.  Patient will now receive single agent Taxol  on days 1, 8, and 15 with day 22 off.  Will reimage after 3 cycles.  Can also consider lenvatinib or possibly gemcitabine in the future.  Proceed with cycle 3, day 1 of treatment today.  Return to clinic in 1 week for further evaluation and consideration of cycle 3, day 8.   Port: Port revision successful.  Proceed with treatment as above. Hyperglycemia: Patient has improved blood glucose control.  Blood glucose is 129 today. Anemia: Hemoglobin continues to trend up and is now 10.2.  Previously, gynecology oncology cauterized several areas of bleeding.  Leukopenia: Resolved.   Thrombocytopenia: Resolved. Vaginal bleeding: Patient does not complain of this today.  Secondary to progressive disease.  Follow-up with gynecology oncology as scheduled. Claudication symptoms: Resolved.  Patient underwent revascularization procedure on February 04, 2023. Right hamstring tendon rupture: Patient reports there is no plan for surgical repair. Rash: Resolved.  Likely secondary to Doxil  which has been discontinued.   Poor appetite/weight loss: Continue Megace  as prescribed.  Patient refuses referral to dietary.   Toe ulceration and erythema: Patient completed 7-day course of  doxycycline . Neuropathy/dizziness: Patient had an appointment with neurology earlier today. Falls: Intermittent.  Patient now has 2 walkers at her home upstairs and downstairs.  She admits to only using them occasionally.  I spent a total of 30 minutes reviewing chart data, face-to-face evaluation with the patient, counseling and coordination of care as detailed above.   Patient expressed understanding and was in agreement with this plan. She also understands that She can call clinic at any time with any questions, concerns, or complaints.    Cancer Staging  Endometrial cancer [facility]) Staging form: Corpus Uteri - Carcinoma and Carcinosarcoma, AJCC 8th Edition - Clinical stage from 05/30/2022: FIGO Stage IIIB (cT3b, cN0, cM0) - Signed by Shellie Dials, MD on 05/30/2022 Stage prefix: Initial diagnosis   Shellie Dials, MD   09/04/2023 11:17 AM

## 2023-09-04 NOTE — Progress Notes (Signed)
 Patient is doing good, no new questions or concerns for the doctor today.

## 2023-09-06 ENCOUNTER — Ambulatory Visit
Admission: RE | Admit: 2023-09-06 | Discharge: 2023-09-06 | Disposition: A | Discharge status: Home / Self Care | Source: Ambulatory Visit | Attending: Student | Admitting: Student

## 2023-09-06 DIAGNOSIS — M4856XA Collapsed vertebra, not elsewhere classified, lumbar region, initial encounter for fracture: Secondary | ICD-10-CM | POA: Diagnosis not present

## 2023-09-06 DIAGNOSIS — R531 Weakness: Secondary | ICD-10-CM | POA: Diagnosis not present

## 2023-09-06 DIAGNOSIS — M48061 Spinal stenosis, lumbar region without neurogenic claudication: Secondary | ICD-10-CM | POA: Diagnosis not present

## 2023-09-06 DIAGNOSIS — R29898 Other symptoms and signs involving the musculoskeletal system: Secondary | ICD-10-CM | POA: Diagnosis not present

## 2023-09-06 DIAGNOSIS — C541 Malignant neoplasm of endometrium: Secondary | ICD-10-CM | POA: Diagnosis not present

## 2023-09-06 NOTE — Telephone Encounter (Signed)
 PAP: Application for Novolog , Ozempic , and Horace Lye has been submitted to [employer], via fax

## 2023-09-09 ENCOUNTER — Other Ambulatory Visit (INDEPENDENT_AMBULATORY_CARE_PROVIDER_SITE_OTHER): Payer: Self-pay | Admitting: Pharmacist

## 2023-09-09 ENCOUNTER — Telehealth: Payer: Self-pay

## 2023-09-09 ENCOUNTER — Encounter: Payer: Self-pay | Admitting: Obstetrics and Gynecology

## 2023-09-09 DIAGNOSIS — E118 Type 2 diabetes mellitus with unspecified complications: Secondary | ICD-10-CM

## 2023-09-09 NOTE — Progress Notes (Signed)
 Brief Telephone Documentation Reason for Call: Patient called regarding questions about PAP  Summary of Call: Patient received letter in the mail dated 08/29/23 from [employer] stating HCP pages missing  Reviewed with patient that her full application was submitted on 09/06/23 by the PAP team. She will reach out if she received any new notices of missing information dated after 5/9.   Follow Up: Patient given direct line for further questions/concerns.  Daron Ellen, PharmD Clinical Pharmacist [facility] [phone]

## 2023-09-09 NOTE — Telephone Encounter (Signed)
 Call placed to [facility] to have results of ER/PR and histologic subtype faxed. They located results for ER/PR and will fax.

## 2023-09-10 ENCOUNTER — Telehealth: Payer: Self-pay

## 2023-09-10 DIAGNOSIS — I350 Nonrheumatic aortic (valve) stenosis: Secondary | ICD-10-CM | POA: Diagnosis not present

## 2023-09-10 DIAGNOSIS — C541 Malignant neoplasm of endometrium: Secondary | ICD-10-CM | POA: Diagnosis not present

## 2023-09-10 DIAGNOSIS — E785 Hyperlipidemia, unspecified: Secondary | ICD-10-CM | POA: Diagnosis not present

## 2023-09-10 DIAGNOSIS — E1140 Type 2 diabetes mellitus with diabetic neuropathy, unspecified: Secondary | ICD-10-CM | POA: Diagnosis not present

## 2023-09-10 DIAGNOSIS — R001 Bradycardia, unspecified: Secondary | ICD-10-CM | POA: Diagnosis not present

## 2023-09-10 DIAGNOSIS — E1151 Type 2 diabetes mellitus with diabetic peripheral angiopathy without gangrene: Secondary | ICD-10-CM | POA: Diagnosis not present

## 2023-09-10 DIAGNOSIS — J4489 Other specified chronic obstructive pulmonary disease: Secondary | ICD-10-CM | POA: Diagnosis not present

## 2023-09-10 DIAGNOSIS — I10 Essential (primary) hypertension: Secondary | ICD-10-CM | POA: Diagnosis not present

## 2023-09-10 DIAGNOSIS — I739 Peripheral vascular disease, unspecified: Secondary | ICD-10-CM | POA: Diagnosis not present

## 2023-09-10 DIAGNOSIS — J439 Emphysema, unspecified: Secondary | ICD-10-CM | POA: Diagnosis not present

## 2023-09-10 NOTE — Telephone Encounter (Signed)
 Copied from CRM [id number]. Topic: General - Other >> Sep 10, 2023  8:25 AM Emylou G wrote: Reason for CRM: [place] w/[employer] called.. informing us  they need front and back copies of prescription ins card submitted.. Unable to read the other one. Fax it to: [phone] contact: [phone]

## 2023-09-11 ENCOUNTER — Ambulatory Visit: Admitting: Oncology

## 2023-09-11 ENCOUNTER — Inpatient Hospital Stay

## 2023-09-11 ENCOUNTER — Ambulatory Visit

## 2023-09-11 ENCOUNTER — Encounter

## 2023-09-11 ENCOUNTER — Inpatient Hospital Stay (HOSPITAL_BASED_OUTPATIENT_CLINIC_OR_DEPARTMENT_OTHER): Admitting: Oncology

## 2023-09-11 ENCOUNTER — Other Ambulatory Visit

## 2023-09-11 VITALS — BP 150/63 | HR 69

## 2023-09-11 VITALS — BP 142/88 | HR 63 | Temp 97.5°F | Resp 20 | Wt 165.4 lb

## 2023-09-11 DIAGNOSIS — Z5111 Encounter for antineoplastic chemotherapy: Secondary | ICD-10-CM | POA: Diagnosis not present

## 2023-09-11 DIAGNOSIS — C541 Malignant neoplasm of endometrium: Secondary | ICD-10-CM

## 2023-09-11 LAB — CBC WITH DIFFERENTIAL (CANCER CENTER ONLY)
Abs Immature Granulocytes: 0.08 10*3/uL — ABNORMAL HIGH (ref 0.00–0.07)
Basophils Absolute: 0 10*3/uL (ref 0.0–0.1)
Basophils Relative: 1 %
Eosinophils Absolute: 0.1 10*3/uL (ref 0.0–0.5)
Eosinophils Relative: 3 %
HCT: 27.8 % — ABNORMAL LOW (ref 36.0–46.0)
Hemoglobin: 8.9 g/dL — ABNORMAL LOW (ref 12.0–15.0)
Immature Granulocytes: 2 %
Lymphocytes Relative: 9 %
Lymphs Abs: 0.4 10*3/uL — ABNORMAL LOW (ref 0.7–4.0)
MCH: 28 pg (ref 26.0–34.0)
MCHC: 32 g/dL (ref 30.0–36.0)
MCV: 87.4 fL (ref 80.0–100.0)
Monocytes Absolute: 0.3 10*3/uL (ref 0.1–1.0)
Monocytes Relative: 7 %
Neutro Abs: 3.7 10*3/uL (ref 1.7–7.7)
Neutrophils Relative %: 78 %
Platelet Count: 186 10*3/uL (ref 150–400)
RBC: 3.18 MIL/uL — ABNORMAL LOW (ref 3.87–5.11)
RDW: 17.1 % — ABNORMAL HIGH (ref 11.5–15.5)
WBC Count: 4.6 10*3/uL (ref 4.0–10.5)
nRBC: 0 % (ref 0.0–0.2)

## 2023-09-11 LAB — CMP (CANCER CENTER ONLY)
ALT: 18 U/L (ref 0–44)
AST: 16 U/L (ref 15–41)
Albumin: 2.9 g/dL — ABNORMAL LOW (ref 3.5–5.0)
Alkaline Phosphatase: 69 U/L (ref 38–126)
Anion gap: 6 (ref 5–15)
BUN: 23 mg/dL (ref 8–23)
CO2: 28 mmol/L (ref 22–32)
Calcium: 8.2 mg/dL — ABNORMAL LOW (ref 8.9–10.3)
Chloride: 103 mmol/L (ref 98–111)
Creatinine: 1 mg/dL (ref 0.44–1.00)
GFR, Estimated: 59 mL/min — ABNORMAL LOW (ref >60)
Glucose, Bld: 157 mg/dL — ABNORMAL HIGH (ref 70–99)
Potassium: 4.2 mmol/L (ref 3.5–5.1)
Sodium: 137 mmol/L (ref 135–145)
Total Bilirubin: 0.7 mg/dL (ref 0.0–1.2)
Total Protein: 5.3 g/dL — ABNORMAL LOW (ref 6.5–8.1)

## 2023-09-11 LAB — SAMPLE TO BLOOD BANK

## 2023-09-11 MED ORDER — HEPARIN SOD (PORK) LOCK FLUSH 100 UNIT/ML IV SOLN
500.0000 [IU] | Freq: Once | INTRAVENOUS | Status: AC | PRN
  Administered 2023-09-11: 500 [IU]
  Filled 2023-09-11: qty 5

## 2023-09-11 MED ORDER — PACLITAXEL CHEMO INJECTION 300 MG/50ML
80.0000 mg/m2 | Freq: Once | INTRAVENOUS | Status: AC
  Administered 2023-09-11: 150 mg via INTRAVENOUS
  Filled 2023-09-11: qty 25

## 2023-09-11 MED ORDER — DEXAMETHASONE SODIUM PHOSPHATE 10 MG/ML IJ SOLN
10.0000 mg | Freq: Once | INTRAMUSCULAR | Status: AC
  Administered 2023-09-11: 10 mg via INTRAVENOUS
  Filled 2023-09-11: qty 1

## 2023-09-11 MED ORDER — SODIUM CHLORIDE 0.9 % IV SOLN
INTRAVENOUS | Status: DC
Start: 2023-09-11 — End: 2023-09-11
  Filled 2023-09-11: qty 250

## 2023-09-11 MED ORDER — FAMOTIDINE IN NACL 20-0.9 MG/50ML-% IV SOLN
20.0000 mg | Freq: Once | INTRAVENOUS | Status: AC
  Administered 2023-09-11: 20 mg via INTRAVENOUS
  Filled 2023-09-11: qty 50

## 2023-09-11 MED ORDER — DIPHENHYDRAMINE HCL 50 MG/ML IJ SOLN
25.0000 mg | Freq: Once | INTRAMUSCULAR | Status: AC
  Administered 2023-09-11: 25 mg via INTRAVENOUS
  Filled 2023-09-11: qty 1

## 2023-09-11 MED ORDER — SODIUM CHLORIDE 0.9% FLUSH
10.0000 mL | INTRAVENOUS | Status: DC | PRN
  Administered 2023-09-11: 10 mL
  Filled 2023-09-11: qty 10

## 2023-09-11 NOTE — Progress Notes (Signed)
 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  INTERVAL HISTORY: Patient returns to clinic today for repeat laboratory work, further evaluation, consideration of cycle 3, day 8 of single agent Taxol .  She continues to feel well and is at her baseline.  She denies any further falls this week.  She has chronic weakness and fatigue.  She continues to have occasional dizziness and chronic peripheral neuropathy.  She reports persistent vaginal bleeding.  She has no other neurologic complaints.  She denies any recent fevers or illnesses.  She has no chest pain, shortness of breath, cough, or hemoptysis.  She denies any nausea, vomiting, constipation, or diarrhea.  She has no urinary complaints.  Patient is no further specific complaints today.  REVIEW OF SYSTEMS:   Review of Systems  Constitutional:  Positive for malaise/fatigue. Negative for fever and weight loss.  Respiratory: Negative.  Negative for cough, hemoptysis and shortness of breath.   Cardiovascular: Negative.  Negative for chest pain and leg swelling.  Gastrointestinal: Negative.  Negative for abdominal pain, blood in stool and melena.  Genitourinary: Negative.  Negative for dysuria.  Musculoskeletal: Negative.  Negative for back pain and falls.  Skin: Negative.  Negative for rash.  Neurological:  Positive for dizziness, sensory change and weakness. Negative for focal weakness and headaches.  Psychiatric/Behavioral:  Negative.  The patient is not nervous/anxious.     As per HPI. Otherwise, a complete review of systems is negative.  PAST MEDICAL HISTORY: Past Medical History:  Diagnosis Date   Adenomatous colon polyp    Aortic stenosis    Arthritis    Asthma    Cancer (HCC)    Claudication (HCC)    COPD (chronic obstructive pulmonary disease) (HCC)    Depression    Diabetes mellitus without complication (HCC)    Esophageal dysphagia    GERD (gastroesophageal reflux disease)    Hyperlipemia    Hypertension    Obesity    Severe obesity (BMI 35.0-35.9 with comorbidity) (HCC)     PAST SURGICAL HISTORY: Past Surgical History:  Procedure Laterality Date   BLADDER SURGERY     COLONOSCOPY WITH PROPOFOL  N/A 08/05/2015   Procedure: COLONOSCOPY WITH PROPOFOL ;  Surgeon: Luella Sager, MD;  Location: [facility] ENDOSCOPY;  Service: Endoscopy;  Laterality: N/A;   ESOPHAGOGASTRODUODENOSCOPY N/A 01/09/2022   Procedure: ESOPHAGOGASTRODUODENOSCOPY (EGD);  Surgeon: Shane Darling, MD;  Location: [facility] ENDOSCOPY;  Service: Endoscopy;  Laterality: N/A;   FOOT SURGERY Left    FRACTURE SURGERY     IR CV LINE INJECTION  06/22/2022   IR IMAGING GUIDED PORT INSERTION  06/05/2022   IR PORT REPAIR CENTRAL VENOUS ACCESS DEVICE  07/02/2022   LOWER EXTREMITY ANGIOGRAPHY Left 02/04/2023   Procedure: Lower Extremity Angiography;  Surgeon: Celso College, MD;  Location: [facility] INVASIVE CV LAB;  Service: Cardiovascular;  Laterality: Left;   ORIF ANKLE FRACTURE Left 03/26/2017   Procedure: OPEN REDUCTION INTERNAL FIXATION (ORIF) ANKLE FRACTURE;  Surgeon: Elner Hahn, MD;  Location: [facility] ORS;  Service: Orthopedics;  Laterality: Left;    FAMILY HISTORY: Family History  Problem Relation Age of Onset   CVA Mother    Diabetes Mother    CAD Father    Breast cancer Neg Hx     ADVANCED DIRECTIVES (Y/N):  N  HEALTH MAINTENANCE: Social History   Tobacco Use   Smoking status: Former    Current packs/day: 1.00    Average  packs/day: 1 pack/day for 40.0 years (40.0 ttl pk-yrs)    Types: Cigarettes   Smokeless tobacco: Never   Tobacco comments:    Quite 2015  Vaping Use   Vaping status: Never Used  Substance Use Topics   Alcohol use: No   Drug use: No     Colonoscopy:  PAP:  Bone density:  Lipid panel:  No Known Allergies  Current Outpatient Medications  Medication Sig Dispense Refill   albuterol  (VENTOLIN  HFA) 108 (90 Base) MCG/ACT inhaler Inhale 2 puffs into the lungs every 6 (six) hours as needed for wheezing or shortness of breath. 1 each 1   amLODipine  (NORVASC ) 10 MG tablet Take 10 mg by mouth daily.     aspirin  EC 81 MG tablet Take 1 tablet (81 mg total) by mouth daily. Swallow whole. 150 tablet 2   atorvastatin  (LIPITOR) 80 MG tablet Take 80 mg by mouth daily.     buPROPion  (WELLBUTRIN  XL) 300 MG 24 hr tablet TAKE 1 TABLET BY MOUTH DAILY 90 tablet 3   Cholecalciferol  (VITAMIN D3) 50 MCG (2000 UT) capsule Take 2,000 Units by mouth daily.     citalopram  (CELEXA ) 20 MG tablet Take 20 mg by mouth daily.     cloNIDine  (CATAPRES ) 0.2 MG tablet Take 0.2 mg by mouth 2 (two) times daily.     clopidogrel  (PLAVIX ) 75 MG tablet Take 1 tablet (75 mg total) by mouth daily. 90 tablet 3   Continuous Glucose Receiver (FREESTYLE LIBRE 3 READER) DEVI Use to check blood glucose continuously. Dx: E11.8, Z79.4 1 each 0   Continuous Glucose Sensor (FREESTYLE LIBRE 3 PLUS SENSOR) MISC Change sensor every 15 days. E11.8, Z79.4. 6 each 3   cyanocobalamin  (VITAMIN B12) 1000 MCG tablet Take 1 tablet (1,000 mcg total) by mouth daily. 90 tablet 3   doxycycline  (VIBRA -TABS) 100 MG tablet Take 1 tablet (100 mg total) by mouth 2 (two) times daily. 14 tablet 0   fluticasone -salmeterol (WIXELA INHUB) 250-50 MCG/ACT AEPB Inhale 1 puff into the lungs in the morning and at bedtime. 180 each 6   glipiZIDE  (GLUCOTROL  XL) 5 MG 24 hr tablet TAKE 1 TABLET BY MOUTH DAILY 30 tablet 1   insulin  aspart (NOVOLOG ) 100 UNIT/ML injection  Inject 7 Units into the skin 3 (three) times daily before meals.     insulin  degludec (TRESIBA FLEXTOUCH) 100 UNIT/ML FlexTouch Pen Inject 25 Units into the skin daily. 25 units once daily - REPLACES Levemir      Iron , Ferrous Sulfate , 325 (65 Fe) MG TABS Take 325 mg by mouth every other day. 90 tablet 3   losartan -hydrochlorothiazide  (HYZAAR) 100-25 MG tablet Take 1 tablet by mouth daily.     megestrol  (MEGACE ) 40 MG tablet Take 1 tablet (40 mg total) by mouth daily. 30 tablet 2   metoprolol  succinate (TOPROL -XL) 50 MG 24 hr tablet Take 1 tablet by mouth daily.     montelukast  (SINGULAIR ) 10 MG tablet Take 10 mg by mouth at bedtime.     ondansetron  (ZOFRAN ) 8 MG tablet Take 1 tablet (8 mg total) by mouth every 8 (eight) hours as needed for nausea or vomiting. 30 tablet  0   ONETOUCH VERIO test strip 1 each by Other route 3 (three) times daily. 100 each 10   Semaglutide ,0.25 or 0.5MG /DOS, 2 MG/3ML SOPN Inject 0.5 mg into the skin once a week. 3 mL 3   Tiotropium Bromide Monohydrate  (SPIRIVA  RESPIMAT) 2.5 MCG/ACT AERS Inhale 2 puffs into the lungs daily. 60 each 11   No current facility-administered medications for this visit.    OBJECTIVE: Vitals:   09/11/23 0956 09/11/23 1005  BP: (!) 169/57 (!) 142/88  Pulse: 63   Resp: 20   Temp: (!) 97.5 F (36.4 C)   SpO2: 100%        Body mass index is 30.25 kg/m.    ECOG FS:1 - Symptomatic but completely ambulatory  General: Well-developed, well-nourished, no acute distress.  Sitting in a wheelchair. Eyes: Pink conjunctiva, anicteric sclera. HEENT: Normocephalic, moist mucous membranes. Lungs: No audible wheezing or coughing. Heart: Regular rate and rhythm. Abdomen: Soft, nontender, no obvious distention. Musculoskeletal: No edema, cyanosis, or clubbing. Neuro: Alert, answering all questions appropriately. Cranial nerves grossly intact. Skin: No rashes or petechiae noted. Psych: Normal affect.  LAB RESULTS:  Lab Results  Component  Value Date   NA 140 09/04/2023   K 4.0 09/04/2023   CL 103 09/04/2023   CO2 28 09/04/2023   GLUCOSE 129 (H) 09/04/2023   BUN 17 09/04/2023   CREATININE 1.00 09/04/2023   CALCIUM  8.3 (L) 09/04/2023   PROT 5.6 (L) 09/04/2023   ALBUMIN 3.1 (L) 09/04/2023   AST 15 09/04/2023   ALT 12 09/04/2023   ALKPHOS 77 09/04/2023   BILITOT 0.7 09/04/2023   GFRNONAA 59 (L) 09/04/2023   GFRAA >60 03/27/2017    Lab Results  Component Value Date   WBC 4.6 09/11/2023   NEUTROABS 3.7 09/11/2023   HGB 8.9 (L) 09/11/2023   HCT 27.8 (L) 09/11/2023   MCV 87.4 09/11/2023   PLT 186 09/11/2023     STUDIES: CT CHEST WO CONTRAST Result Date: 08/28/2023 CLINICAL DATA:  Interstitial lung disease and a history of smoking with asthma and tree-in-bud nodularity on the most recent PET-CT. She is currently on chemotherapy for endometrial cancer. EXAM: CT CHEST WITHOUT CONTRAST TECHNIQUE: Multidetector CT imaging of the chest was performed following the standard protocol without IV contrast. RADIATION DOSE REDUCTION: This exam was performed according to the departmental dose-optimization program which includes automated exposure control, adjustment of the mA and/or kV according to patient size and/or use of iterative reconstruction technique. COMPARISON:  PET CTs dated 06/14/2023 and 01/15/2023, and chest, abdomen and pelvis CT with IV contrast 10/08/2022. FINDINGS: Cardiovascular: Again noted right chest port with IJ approach catheter terminating at the superior cavoatrial junction. The cardiac blood pool is less dense than the myocardium consistent with anemia. The heart is upper limit normal in size. There is heavy calcification in the inferolateral mitral ring, three-vessel coronary calcifications. There are moderate calcifications in the aorta and great vessels but no aortic aneurysm. The pulmonary veins are normal caliber. There is no pericardial effusion. The pulmonary trunk again measuring prominent 3.2 cm  indicating arterial hypertension. Mediastinum/Nodes: Small hiatal hernia. Mild thickening in the distal esophagus which did not show hypermetabolism, probably reflux related. The remaining esophagus is unremarkable. Thyroid  gland, trachea and main bronchi are unremarkable. No intrathoracic or axillary adenopathy is seen without contrast. Lungs/Pleura: Previously there was tree-in-bud interstitial change in the posterior segment right upper lobe, in the posterior aspect of the right middle lobe, and in the lateral basal segment of the  right lower lobe. These opacities have cleared in the interval. There is new tree-in-bud micronodularity in the posterior right lower lobe, coarse interstitial and ground-glass opacity probably a focal pneumonitis in the infrahilar anteromedial right lower lobe, and new coarsely nodular opacities in the posteromedial extreme right lower lobe base with the largest individual nodule 1.7 cm on 4:134. Given the short interval time frame of the appearance of this nodularity I suspect these are inflammatory nodules. Other satellite nodules are present above and medial to this. Largest of these measuring 9 mm on 4:123. There is diffuse bronchial thickening, greatest in the right lower lobe. There are occasional calcified granulomas in the left lung. Additional new tree-in-bud micronodular disease in the superior segment of the left lower lobe. Remainder of the lungs appear generally clear. There is no pleural effusion thorax. Upper Abdomen: No acute abnormality. Abdominal aortic atherosclerosis. Adjacent stable right adrenal adenomas are again noted measuring 1.7 cm and -15 Hounsfield units, and 1.3 cm and -4 Hounsfield units. Left adrenal gland is unremarkable. The visualized liver, gallbladder bile ducts are unremarkable. Musculoskeletal: Osteopenia. There is mild chronic anterior wedge compression deformity of the T5, T10, and T11 vertebrae. No acute or progressive compression fracture is  seen. There are multilevel healed fractures of the lateral right ribs. No acute or other significant osseous findings. No chest wall mass is seen. IMPRESSION: 1. Interval clearing of previously seen tree-in-bud interstitial opacities in the right upper lobe, right middle lobe, and right lower lobe. 2. New tree-in-bud micronodular disease in the posterior right lower lobe, superior segment left lower lobe, and coarse interstitial and ground-glass opacity in the infrahilar anteromedial right lower lobe. Atypical infectious process favored. 3. New coarsely nodular opacities in the posteromedial extreme right lower lobe base with the largest individual nodule 1.7 cm. Given the short interval time frame of the appearance of these nodules I suspect these are inflammatory nodules. A short interval follow-up CT or PET-CT is recommended given the patient's cancer history, such as in 6-8 weeks. 4. Diffuse bronchial thickening, greatest in the right lower lobe. 5. Aortic and coronary artery atherosclerosis. 6. Prominent pulmonary trunk indicating arterial hypertension. 7. Small hiatal hernia with mild thickening in the distal esophagus, probably reflux related. 8. Stable right adrenal adenomas. 9. Osteopenia and chronic compression deformities. No acute or progressive compression fracture is seen. Aortic Atherosclerosis (ICD10-I70.0). Electronically Signed   By: Denman Fischer M.D.   On: 08/28/2023 00:21      ONCOLOGY HISTORY: Patient was initially hesitant to undergo chemotherapy and elected to do XRT only which was completed on April 18, 2022.  CT scan results from October 08, 2022 reviewed independently with no obvious evidence of progressive disease. She completed 6 cycles of carboplatinum, Taxol , and Keytruda  on October 03, 2022.  Patient then initiated maintenance Keytruda  on October 23, 2022.  PET scan results from January 15, 2023 reviewed independently with mild nonspecific residual hypermetabolism in the lower  uterine segment as well as small bilateral common iliac nodes possibly suggesting nodal metastasis.  Patient was seen by gynecology oncology who determined surgical intervention is not an option.  They recommend discontinuing Keytruda  for progressive disease and initiating single agent Doxil  every 28 days.  Patient received 3 cycles of Doxil  before progression of disease her last dose was given on June 05, 2023.  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  PET  scan results from June 14, 2023 reviewed independently with significant progression of disease.  Case discussed with gynecology oncology.  Patient will now receive single agent Taxol  on days 1, 8, and 15 with day 22 off.  Will reimage after 3 cycles.  Can also consider lenvatinib or possibly gemcitabine in the future.  Proceed with cycle 3, day 8 of treatment today.  Return to clinic in 1 week for further evaluation and consideration of cycle 3, day 15.  Plan to reimage with PET scan approximately 2 to 3 weeks after completion of treatment.  Port: Port revision successful.  Proceed with treatment as above. Hyperglycemia: Chronic and unchanged.  Patient's blood glucose was 157 today.   Anemia: Hemoglobin has trended down to 8.9.  Previously, gynecology oncology cauterized several areas of bleeding.  Vaginal bleeding: Patient has an appointment with gynecology and oncology next week. Claudication symptoms: Resolved.  Patient underwent revascularization procedure on February 04, 2023. Right hamstring tendon rupture: Patient reports there is no plan for surgical repair. Rash: Resolved.  Likely secondary to Doxil  which has been discontinued.   Poor appetite/weight loss: Continue Megace  as prescribed.  Patient refuses referral to dietary.   Toe ulceration and erythema: Patient completed 7-day course  of doxycycline . Neuropathy/dizziness: Continue follow-up with neurology as recommended.   Falls: Intermittent.  Patient now has 2 walkers at her home upstairs and downstairs.  She admits to only using them occasionally.   Patient expressed understanding and was in agreement with this plan. She also understands that She can call clinic at any time with any questions, concerns, or complaints.    Cancer Staging  Endometrial cancer [facility]) Staging form: Corpus Uteri - Carcinoma and Carcinosarcoma, AJCC 8th Edition - Clinical stage from 05/30/2022: FIGO Stage IIIB (cT3b, cN0, cM0) - Signed by Shellie Dials, MD on 05/30/2022 Stage prefix: Initial diagnosis   Shellie Dials, MD   09/11/2023 10:14 AM

## 2023-09-11 NOTE — Patient Instructions (Signed)
 [facility] - A DEPT OF [facility]  Discharge Instructions: Thank you for choosing [facility] to provide your oncology and hematology care.  If you have a lab appointment with the Cancer Center, please go directly to the Cancer Center and check in at the registration area.  Wear comfortable clothing and clothing appropriate for easy access to any Portacath or PICC line.   We strive to give you quality time with your provider. You may need to reschedule your appointment if you arrive late (15 or more minutes).  Arriving late affects you and other patients whose appointments are after yours.  Also, if you miss three or more appointments without notifying the office, you may be dismissed from the clinic at the provider's discretion.      For prescription refill requests, have your pharmacy contact our office and allow 72 hours for refills to be completed.    Today you received the following chemotherapy and/or immunotherapy agents taxol       To help prevent nausea and vomiting after your treatment, we encourage you to take your nausea medication as directed.  BELOW ARE SYMPTOMS THAT SHOULD BE REPORTED IMMEDIATELY: *FEVER GREATER THAN 100.4 F (38 C) OR HIGHER *CHILLS OR SWEATING *NAUSEA AND VOMITING THAT IS NOT CONTROLLED WITH YOUR NAUSEA MEDICATION *UNUSUAL SHORTNESS OF BREATH *UNUSUAL BRUISING OR BLEEDING *URINARY PROBLEMS (pain or burning when urinating, or frequent urination) *BOWEL PROBLEMS (unusual diarrhea, constipation, pain near the anus) TENDERNESS IN MOUTH AND THROAT WITH OR WITHOUT PRESENCE OF ULCERS (sore throat, sores in mouth, or a toothache) UNUSUAL RASH, SWELLING OR PAIN  UNUSUAL VAGINAL DISCHARGE OR ITCHING   Items with * indicate a potential emergency and should be followed up as soon as possible or go to the Emergency Department if any problems should occur.  Please show the CHEMOTHERAPY ALERT CARD or IMMUNOTHERAPY ALERT  CARD at check-in to the Emergency Department and triage nurse.  Should you have questions after your visit or need to cancel or reschedule your appointment, please contact [facility] - A DEPT OF [facility]  [phone] and follow the prompts.  Office hours are 8:00 a.m. to 4:30 p.m. Monday - Friday. Please note that voicemails left after 4:00 p.m. may not be returned until the following business day.  We are closed weekends and major holidays. You have access to a nurse at all times for urgent questions. Please call the main number to the clinic [phone] and follow the prompts.  For any non-urgent questions, you may also contact your provider using MyChart. We now offer e-Visits for anyone 18 and older to request care online for non-urgent symptoms. For details visit [email or web address].   Also download the MyChart app! Go to the app store, search "MyChart", open the app, select [facility], and log in with your MyChart username and password.

## 2023-09-13 NOTE — Telephone Encounter (Signed)
 PAP: Patient assistance application for Novolog , Ozempic , and Horace Lye has been approved by [employer] Companies: [employer] from 09/27/2023 to 04/29/2024. Medication should be delivered to PAP Delivery: Provider's office. For further shipping updates, please contact [employer] at [phone]. Patient ID is: not provided

## 2023-09-17 ENCOUNTER — Encounter (INDEPENDENT_AMBULATORY_CARE_PROVIDER_SITE_OTHER): Payer: Self-pay

## 2023-09-18 ENCOUNTER — Inpatient Hospital Stay

## 2023-09-18 ENCOUNTER — Ambulatory Visit

## 2023-09-18 ENCOUNTER — Inpatient Hospital Stay (HOSPITAL_BASED_OUTPATIENT_CLINIC_OR_DEPARTMENT_OTHER): Admitting: Oncology

## 2023-09-18 ENCOUNTER — Encounter: Payer: Self-pay | Admitting: Oncology

## 2023-09-18 ENCOUNTER — Other Ambulatory Visit

## 2023-09-18 ENCOUNTER — Encounter

## 2023-09-18 ENCOUNTER — Ambulatory Visit: Admitting: Oncology

## 2023-09-18 ENCOUNTER — Inpatient Hospital Stay (HOSPITAL_BASED_OUTPATIENT_CLINIC_OR_DEPARTMENT_OTHER): Admitting: Nurse Practitioner

## 2023-09-18 VITALS — BP 129/54 | HR 64 | Temp 97.1°F | Resp 16 | Ht 62.0 in | Wt 160.0 lb

## 2023-09-18 DIAGNOSIS — C541 Malignant neoplasm of endometrium: Secondary | ICD-10-CM

## 2023-09-18 DIAGNOSIS — Z5111 Encounter for antineoplastic chemotherapy: Secondary | ICD-10-CM | POA: Diagnosis not present

## 2023-09-18 DIAGNOSIS — N939 Abnormal uterine and vaginal bleeding, unspecified: Secondary | ICD-10-CM

## 2023-09-18 LAB — CMP (CANCER CENTER ONLY)
ALT: 14 U/L (ref 0–44)
AST: 17 U/L (ref 15–41)
Albumin: 3.2 g/dL — ABNORMAL LOW (ref 3.5–5.0)
Alkaline Phosphatase: 65 U/L (ref 38–126)
Anion gap: 5 (ref 5–15)
BUN: 30 mg/dL — ABNORMAL HIGH (ref 8–23)
CO2: 29 mmol/L (ref 22–32)
Calcium: 8.1 mg/dL — ABNORMAL LOW (ref 8.9–10.3)
Chloride: 104 mmol/L (ref 98–111)
Creatinine: 0.8 mg/dL (ref 0.44–1.00)
GFR, Estimated: >60 mL/min (ref >60)
Glucose, Bld: 164 mg/dL — ABNORMAL HIGH (ref 70–99)
Potassium: 3.6 mmol/L (ref 3.5–5.1)
Sodium: 138 mmol/L (ref 135–145)
Total Bilirubin: 0.7 mg/dL (ref 0.0–1.2)
Total Protein: 5.3 g/dL — ABNORMAL LOW (ref 6.5–8.1)

## 2023-09-18 LAB — SAMPLE TO BLOOD BANK

## 2023-09-18 LAB — CBC WITH DIFFERENTIAL (CANCER CENTER ONLY)
Abs Immature Granulocytes: 0.01 10*3/uL (ref 0.00–0.07)
Basophils Absolute: 0 10*3/uL (ref 0.0–0.1)
Basophils Relative: 0 %
Eosinophils Absolute: 0.1 10*3/uL (ref 0.0–0.5)
Eosinophils Relative: 3 %
HCT: 26 % — ABNORMAL LOW (ref 36.0–46.0)
Hemoglobin: 8.4 g/dL — ABNORMAL LOW (ref 12.0–15.0)
Immature Granulocytes: 0 %
Lymphocytes Relative: 12 %
Lymphs Abs: 0.3 10*3/uL — ABNORMAL LOW (ref 0.7–4.0)
MCH: 28.2 pg (ref 26.0–34.0)
MCHC: 32.3 g/dL (ref 30.0–36.0)
MCV: 87.2 fL (ref 80.0–100.0)
Monocytes Absolute: 0.2 10*3/uL (ref 0.1–1.0)
Monocytes Relative: 7 %
Neutro Abs: 2.2 10*3/uL (ref 1.7–7.7)
Neutrophils Relative %: 78 %
Platelet Count: 159 10*3/uL (ref 150–400)
RBC: 2.98 MIL/uL — ABNORMAL LOW (ref 3.87–5.11)
RDW: 17.2 % — ABNORMAL HIGH (ref 11.5–15.5)
WBC Count: 2.8 10*3/uL — ABNORMAL LOW (ref 4.0–10.5)
nRBC: 0 % (ref 0.0–0.2)

## 2023-09-18 MED ORDER — SODIUM CHLORIDE 0.9 % IV SOLN
80.0000 mg/m2 | Freq: Once | INTRAVENOUS | Status: AC
  Administered 2023-09-18: 150 mg via INTRAVENOUS
  Filled 2023-09-18: qty 25

## 2023-09-18 MED ORDER — DIPHENHYDRAMINE HCL 50 MG/ML IJ SOLN
25.0000 mg | Freq: Once | INTRAMUSCULAR | Status: AC
  Administered 2023-09-18: 25 mg via INTRAVENOUS
  Filled 2023-09-18: qty 1

## 2023-09-18 MED ORDER — DEXAMETHASONE SODIUM PHOSPHATE 10 MG/ML IJ SOLN
10.0000 mg | Freq: Once | INTRAMUSCULAR | Status: AC
  Administered 2023-09-18: 10 mg via INTRAVENOUS
  Filled 2023-09-18: qty 1

## 2023-09-18 MED ORDER — FAMOTIDINE IN NACL 20-0.9 MG/50ML-% IV SOLN
20.0000 mg | Freq: Once | INTRAVENOUS | Status: AC
  Administered 2023-09-18: 20 mg via INTRAVENOUS
  Filled 2023-09-18: qty 50

## 2023-09-18 MED ORDER — SODIUM CHLORIDE 0.9 % IV SOLN
INTRAVENOUS | Status: DC
  Filled 2023-09-18: qty 250

## 2023-09-18 MED ORDER — HEPARIN SOD (PORK) LOCK FLUSH 100 UNIT/ML IV SOLN
500.0000 [IU] | Freq: Once | INTRAVENOUS | Status: AC | PRN
  Administered 2023-09-18: 500 [IU]
  Filled 2023-09-18: qty 5

## 2023-09-18 NOTE — Progress Notes (Signed)
 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  INTERVAL HISTORY: She returns to clinic today for repeat laboratory work, further evaluation, and consideration of cycle 3, day 15 of single agent Taxol .  She continues to feel well and is at her baseline. She denies any further falls this week.  She has chronic weakness and fatigue.  Her peripheral neuropathy is unchanged.  She reports persistent vaginal bleeding.  She has no other neurologic complaints.  She denies any recent fevers or illnesses.  She has no chest pain, shortness of breath, cough, or hemoptysis.  She denies any nausea, vomiting, constipation, or diarrhea.  She has no urinary complaints.  Patient offers no further specific complaints today.  REVIEW OF SYSTEMS:   Review of Systems  Constitutional:  Positive for malaise/fatigue. Negative for fever and weight loss.  Respiratory: Negative.  Negative for cough, hemoptysis and shortness of breath.   Cardiovascular: Negative.  Negative for chest pain and leg swelling.  Gastrointestinal: Negative.  Negative for abdominal pain, blood in stool and melena.  Genitourinary: Negative.  Negative for dysuria.  Musculoskeletal: Negative.  Negative for back pain and falls.  Skin: Negative.  Negative for rash.  Neurological:  Positive for dizziness, sensory change and weakness. Negative for focal weakness and headaches.  Psychiatric/Behavioral: Negative.  The patient is not  nervous/anxious.     As per HPI. Otherwise, a complete review of systems is negative.  PAST MEDICAL HISTORY: Past Medical History:  Diagnosis Date   Adenomatous colon polyp    Aortic stenosis    Arthritis    Asthma    Cancer (HCC)    Claudication (HCC)    COPD (chronic obstructive pulmonary disease) (HCC)    Depression    Diabetes mellitus without complication (HCC)    Esophageal dysphagia    GERD (gastroesophageal reflux disease)    Hyperlipemia    Hypertension    Obesity    Severe obesity (BMI 35.0-35.9 with comorbidity) (HCC)     PAST SURGICAL HISTORY: Past Surgical History:  Procedure Laterality Date   BLADDER SURGERY     COLONOSCOPY WITH PROPOFOL  N/A 08/05/2015   Procedure: COLONOSCOPY WITH PROPOFOL ;  Surgeon: Luella Sager, MD;  Location: [facility] ENDOSCOPY;  Service: Endoscopy;  Laterality: N/A;   ESOPHAGOGASTRODUODENOSCOPY N/A 01/09/2022   Procedure: ESOPHAGOGASTRODUODENOSCOPY (EGD);  Surgeon: Shane Darling, MD;  Location: [facility] ENDOSCOPY;  Service: Endoscopy;  Laterality: N/A;   FOOT SURGERY Left    FRACTURE SURGERY     IR CV LINE INJECTION  06/22/2022   IR IMAGING GUIDED PORT INSERTION  06/05/2022   IR PORT REPAIR CENTRAL VENOUS ACCESS DEVICE  07/02/2022   LOWER EXTREMITY ANGIOGRAPHY Left 02/04/2023   Procedure: Lower Extremity Angiography;  Surgeon: Celso College, MD;  Location: [facility] INVASIVE CV LAB;  Service: Cardiovascular;  Laterality: Left;   ORIF ANKLE FRACTURE Left 03/26/2017   Procedure: OPEN REDUCTION INTERNAL FIXATION (ORIF) ANKLE FRACTURE;  Surgeon: Elner Hahn, MD;  Location: [facility] ORS;  Service: Orthopedics;  Laterality: Left;    FAMILY HISTORY: Family History  Problem Relation Age of Onset  CVA Mother    Diabetes Mother    CAD Father    Breast cancer Neg Hx     ADVANCED DIRECTIVES (Y/N):  N  HEALTH MAINTENANCE: Social History   Tobacco Use   Smoking status: Former    Current packs/day: 1.00    Average packs/day: 1 pack/day for 40.0  years (40.0 ttl pk-yrs)    Types: Cigarettes   Smokeless tobacco: Never   Tobacco comments:    Quite 2015  Vaping Use   Vaping status: Never Used  Substance Use Topics   Alcohol use: No   Drug use: No     Colonoscopy:  PAP:  Bone density:  Lipid panel:  No Known Allergies  Current Outpatient Medications  Medication Sig Dispense Refill   albuterol  (VENTOLIN  HFA) 108 (90 Base) MCG/ACT inhaler Inhale 2 puffs into the lungs every 6 (six) hours as needed for wheezing or shortness of breath. 1 each 1   amLODipine  (NORVASC ) 10 MG tablet Take 10 mg by mouth daily.     aspirin  EC 81 MG tablet Take 1 tablet (81 mg total) by mouth daily. Swallow whole. 150 tablet 2   atorvastatin  (LIPITOR) 80 MG tablet Take 80 mg by mouth daily.     buPROPion  (WELLBUTRIN  XL) 300 MG 24 hr tablet TAKE 1 TABLET BY MOUTH DAILY 90 tablet 3   Cholecalciferol  (VITAMIN D3) 50 MCG (2000 UT) capsule Take 2,000 Units by mouth daily.     citalopram  (CELEXA ) 20 MG tablet Take 20 mg by mouth daily.     cloNIDine  (CATAPRES ) 0.2 MG tablet Take 0.2 mg by mouth 2 (two) times daily.     clopidogrel  (PLAVIX ) 75 MG tablet Take 1 tablet (75 mg total) by mouth daily. 90 tablet 3   Continuous Glucose Receiver (FREESTYLE LIBRE 3 READER) DEVI Use to check blood glucose continuously. Dx: E11.8, Z79.4 1 each 0   Continuous Glucose Sensor (FREESTYLE LIBRE 3 PLUS SENSOR) MISC Change sensor every 15 days. E11.8, Z79.4. 6 each 3   cyanocobalamin  (VITAMIN B12) 1000 MCG tablet Take 1 tablet (1,000 mcg total) by mouth daily. 90 tablet 3   fluticasone -salmeterol (WIXELA INHUB) 250-50 MCG/ACT AEPB Inhale 1 puff into the lungs in the morning and at bedtime. 180 each 6   glipiZIDE  (GLUCOTROL  XL) 5 MG 24 hr tablet TAKE 1 TABLET BY MOUTH DAILY 30 tablet 1   insulin  aspart (NOVOLOG ) 100 UNIT/ML injection Inject 7 Units into the skin 3 (three) times daily before meals.     insulin  degludec (TRESIBA FLEXTOUCH) 100 UNIT/ML FlexTouch Pen Inject 25 Units  into the skin daily. 25 units once daily - REPLACES Levemir      Iron , Ferrous Sulfate , 325 (65 Fe) MG TABS Take 325 mg by mouth every other day. 90 tablet 3   losartan -hydrochlorothiazide  (HYZAAR) 100-25 MG tablet Take 1 tablet by mouth daily.     megestrol  (MEGACE ) 40 MG tablet Take 1 tablet (40 mg total) by mouth daily. 30 tablet 2   metoprolol  succinate (TOPROL -XL) 50 MG 24 hr tablet Take 1 tablet by mouth daily.     montelukast  (SINGULAIR ) 10 MG tablet Take 10 mg by mouth at bedtime.     ondansetron  (ZOFRAN ) 8 MG tablet Take 1 tablet (8 mg total) by mouth every 8 (eight) hours as needed for nausea or vomiting. 30 tablet 0   ONETOUCH VERIO test strip 1 each by Other route 3 (three) times daily. 100 each 10   Semaglutide ,0.25 or 0.5MG /DOS, 2 MG/3ML SOPN Inject 0.5  mg into the skin once a week. 3 mL 3   Tiotropium Bromide Monohydrate  (SPIRIVA  RESPIMAT) 2.5 MCG/ACT AERS Inhale 2 puffs into the lungs daily. 60 each 11   doxycycline  (VIBRA -TABS) 100 MG tablet Take 1 tablet (100 mg total) by mouth 2 (two) times daily. (Patient not taking: Reported on 09/18/2023) 14 tablet 0   No current facility-administered medications for this visit.    OBJECTIVE: Vitals:   09/18/23 0935  BP: (!) 129/54  Pulse: 64  Resp: 16  Temp: (!) 97.1 F (36.2 C)  SpO2: 98%       Body mass index is 29.26 kg/m.    ECOG FS:1 - Symptomatic but completely ambulatory  General: Well-developed, well-nourished, no acute distress. Eyes: Pink conjunctiva, anicteric sclera. HEENT: Normocephalic, moist mucous membranes. Lungs: No audible wheezing or coughing. Heart: Regular rate and rhythm. Abdomen: Soft, nontender, no obvious distention. Musculoskeletal: No edema, cyanosis, or clubbing. Neuro: Alert, answering all questions appropriately. Cranial nerves grossly intact. Skin: No rashes or petechiae noted. Psych: Normal affect.  LAB RESULTS:  Lab Results  Component Value Date   NA 138 09/18/2023   K 3.6 09/18/2023    CL 104 09/18/2023   CO2 29 09/18/2023   GLUCOSE 164 (H) 09/18/2023   BUN 30 (H) 09/18/2023   CREATININE 0.80 09/18/2023   CALCIUM  8.1 (L) 09/18/2023   PROT 5.3 (L) 09/18/2023   ALBUMIN 3.2 (L) 09/18/2023   AST 17 09/18/2023   ALT 14 09/18/2023   ALKPHOS 65 09/18/2023   BILITOT 0.7 09/18/2023   GFRNONAA >60 09/18/2023   GFRAA >60 03/27/2017    Lab Results  Component Value Date   WBC 2.8 (L) 09/18/2023   NEUTROABS 2.2 09/18/2023   HGB 8.4 (L) 09/18/2023   HCT 26.0 (L) 09/18/2023   MCV 87.2 09/18/2023   PLT 159 09/18/2023     STUDIES: CT CHEST WO CONTRAST Result Date: 08/28/2023 CLINICAL DATA:  Interstitial lung disease and a history of smoking with asthma and tree-in-bud nodularity on the most recent PET-CT. She is currently on chemotherapy for endometrial cancer. EXAM: CT CHEST WITHOUT CONTRAST TECHNIQUE: Multidetector CT imaging of the chest was performed following the standard protocol without IV contrast. RADIATION DOSE REDUCTION: This exam was performed according to the departmental dose-optimization program which includes automated exposure control, adjustment of the mA and/or kV according to patient size and/or use of iterative reconstruction technique. COMPARISON:  PET CTs dated 06/14/2023 and 01/15/2023, and chest, abdomen and pelvis CT with IV contrast 10/08/2022. FINDINGS: Cardiovascular: Again noted right chest port with IJ approach catheter terminating at the superior cavoatrial junction. The cardiac blood pool is less dense than the myocardium consistent with anemia. The heart is upper limit normal in size. There is heavy calcification in the inferolateral mitral ring, three-vessel coronary calcifications. There are moderate calcifications in the aorta and great vessels but no aortic aneurysm. The pulmonary veins are normal caliber. There is no pericardial effusion. The pulmonary trunk again measuring prominent 3.2 cm indicating arterial hypertension. Mediastinum/Nodes:  Small hiatal hernia. Mild thickening in the distal esophagus which did not show hypermetabolism, probably reflux related. The remaining esophagus is unremarkable. Thyroid  gland, trachea and main bronchi are unremarkable. No intrathoracic or axillary adenopathy is seen without contrast. Lungs/Pleura: Previously there was tree-in-bud interstitial change in the posterior segment right upper lobe, in the posterior aspect of the right middle lobe, and in the lateral basal segment of the right lower lobe. These opacities have cleared in the interval. There is new  tree-in-bud micronodularity in the posterior right lower lobe, coarse interstitial and ground-glass opacity probably a focal pneumonitis in the infrahilar anteromedial right lower lobe, and new coarsely nodular opacities in the posteromedial extreme right lower lobe base with the largest individual nodule 1.7 cm on 4:134. Given the short interval time frame of the appearance of this nodularity I suspect these are inflammatory nodules. Other satellite nodules are present above and medial to this. Largest of these measuring 9 mm on 4:123. There is diffuse bronchial thickening, greatest in the right lower lobe. There are occasional calcified granulomas in the left lung. Additional new tree-in-bud micronodular disease in the superior segment of the left lower lobe. Remainder of the lungs appear generally clear. There is no pleural effusion thorax. Upper Abdomen: No acute abnormality. Abdominal aortic atherosclerosis. Adjacent stable right adrenal adenomas are again noted measuring 1.7 cm and -15 Hounsfield units, and 1.3 cm and -4 Hounsfield units. Left adrenal gland is unremarkable. The visualized liver, gallbladder bile ducts are unremarkable. Musculoskeletal: Osteopenia. There is mild chronic anterior wedge compression deformity of the T5, T10, and T11 vertebrae. No acute or progressive compression fracture is seen. There are multilevel healed fractures of the  lateral right ribs. No acute or other significant osseous findings. No chest wall mass is seen. IMPRESSION: 1. Interval clearing of previously seen tree-in-bud interstitial opacities in the right upper lobe, right middle lobe, and right lower lobe. 2. New tree-in-bud micronodular disease in the posterior right lower lobe, superior segment left lower lobe, and coarse interstitial and ground-glass opacity in the infrahilar anteromedial right lower lobe. Atypical infectious process favored. 3. New coarsely nodular opacities in the posteromedial extreme right lower lobe base with the largest individual nodule 1.7 cm. Given the short interval time frame of the appearance of these nodules I suspect these are inflammatory nodules. A short interval follow-up CT or PET-CT is recommended given the patient's cancer history, such as in 6-8 weeks. 4. Diffuse bronchial thickening, greatest in the right lower lobe. 5. Aortic and coronary artery atherosclerosis. 6. Prominent pulmonary trunk indicating arterial hypertension. 7. Small hiatal hernia with mild thickening in the distal esophagus, probably reflux related. 8. Stable right adrenal adenomas. 9. Osteopenia and chronic compression deformities. No acute or progressive compression fracture is seen. Aortic Atherosclerosis (ICD10-I70.0). Electronically Signed   By: Denman Fischer M.D.   On: 08/28/2023 00:21      ONCOLOGY HISTORY: Patient was initially hesitant to undergo chemotherapy and elected to do XRT only which was completed on April 18, 2022.  CT scan results from October 08, 2022 reviewed independently with no obvious evidence of progressive disease. She completed 6 cycles of carboplatinum, Taxol , and Keytruda  on October 03, 2022.  Patient then initiated maintenance Keytruda  on October 23, 2022.  PET scan results from January 15, 2023 reviewed independently with mild nonspecific residual hypermetabolism in the lower uterine segment as well as small bilateral common iliac  nodes possibly suggesting nodal metastasis.  Patient was seen by gynecology oncology who determined surgical intervention is not an option.  They recommend discontinuing Keytruda  for progressive disease and initiating single agent Doxil  every 28 days.  Patient received 3 cycles of Doxil  before progression of disease her last dose was given on June 05, 2023.  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  PET scan results from June 14, 2023 reviewed independently with significant progression of disease.  Case discussed with gynecology oncology.  Patient will now receive single agent Taxol  on days 1, 8, and 15 with day 22 off.  Will reimage after 3 cycles.  Can also consider lenvatinib or possibly gemcitabine in the future.  Proceed with cycle 3, day 15 of treatment today.  Patient also has an appointment with gynecology oncology today.  PET scan is scheduled for October 11, 2023.  Patient return to clinic on October 16, 2023 for further evaluation, discussion of imaging results, and continuation of treatment.   Port: Port revision successful.  Proceed with treatment as above. Hyperglycemia: Patient has improved blood glucose control. Leukopenia: Mild, monitor.  Proceed with treatment as above. Anemia: Hemoglobin is trended up slightly to 8.4.  Previously, gynecology oncology cauterized several areas of bleeding.  Vaginal bleeding: Patient has an appointment with gynecology and oncology this afternoon.   Claudication symptoms: Resolved.  Patient underwent revascularization procedure on February 04, 2023. Right hamstring tendon rupture: Patient reports there is no plan for surgical repair. Rash: Resolved.  Likely secondary to Doxil  which has been discontinued.   Poor appetite/weight loss: Continue Megace  as prescribed.  Patient refuses referral to dietary.    Toe ulceration and erythema: Patient completed 7-day course of doxycycline . Neuropathy/dizziness: Continue follow-up with neurology as recommended.   Falls: Intermittent.  Patient now has 2 walkers at her home upstairs and downstairs.  She admits to only using them occasionally.   Patient expressed understanding and was in agreement with this plan. She also understands that She can call clinic at any time with any questions, concerns, or complaints.    Cancer Staging  Endometrial cancer [facility]) Staging form: Corpus Uteri - Carcinoma and Carcinosarcoma, AJCC 8th Edition - Clinical stage from 05/30/2022: FIGO Stage IIIB (cT3b, cN0, cM0) - Signed by Shellie Dials, MD on 05/30/2022 Stage prefix: Initial diagnosis   Shellie Dials, MD   09/18/2023 10:17 AM

## 2023-09-18 NOTE — Progress Notes (Signed)
 Gynecologic Oncology Consult Visit   Referring Provider: Dr. Cleora Daft  Chief Complaint: Progressive endometrial cancer with cervical, vaginal, and uterine involvement  Subjective:  Terri [name] is a 73 y.o. female seen in consultation from Dr Cleora Daft and Dr Adrian Alba for progressive locally advanced endometrial cancer, found to have disease involvement of cervix and vagina  She returns to clinic today for concern of vaginal bleeding. After previous exam, it had stopped but then returned. Was a pink tinge discharge and has now increased and she has seen some more dark blood streaking. She has chronic weakness and fatigue but is otherwise asymptomatic. She denies passing clots, changes in bowel movements, and changes in urination.     Gyn Oncology History She was referred by Dr. Claudius Cumins to Dr. Cleora Daft for reports of postmenopausal bleeding for 6 months, may be more.  Scant.  Became heavier. Additionally has urinary incontinence.    On exam, friable cervical vs vaginal mass was partially visualized though exam was limited. Biopsy was performed.   Cervical Biopsy: poorly differentiated carcinoma, favor endometrial origin. Positive for p53 and ER. Focal staining for p16, negative for napsin and p63. HER2- 1+ negative. MSI/MMR- Stable  Colonoscopy was scheduled but prep inadequate.    PET/CT EXAM: 10/23 Marked diffuse hypermetabolic activity throughout the entire uterus and cervix, consistent with malignancy. This favors endometrial carcinoma over cervical carcinoma. Borderline enlarged bilateral common iliac lymph nodes noted, but without FDG uptake. No definite evidence of metastatic disease by PET.  Patient seen 01/24/22 and found to have extensive cervical and upper vaginal involvement.  Only having light bleeding.  Decision made to treat her with primary radiation therapy because of extensive involvement of cervix and adjacent vagina, and she was reluctant to consider chemotherapy.  She  received primary pelvic external radiation 50 Gy, completed 04/18/22.   05/15/22- CT Chest abdomen Pelvis w contrast IMPRESSION: 1. Central uterine hypoattenuation likely represents residual endometrial primary. 2. Borderline sized common iliac nodes are similar to the prior PET and were not hypermetabolic on that exam. Favored to be reactive. Otherwise, no evidence of metastatic disease in the abdomen or pelvis. 3. Scattered tiny pulmonary nodules are subpleural predominant and favored to represent subpleural lymph nodes. These can be re-evaluated at follow-up. 4.  Tiny hiatal hernia. 5. Interval nonacute anterior left third and fourth rib fractures. 6. Aortic atherosclerosis (ICD10-I70.0) and emphysema (ICD10-J43.9).  Treated with Carboplatin /Taxol  and Keytruda  x 3 cycles with excellent clinical response clinically.   CT/PET 4/24 shows response in the uterus.  See below. IMPRESSION: 1. Interval decrease in radiotracer uptake associated with the uterus compatible with response to therapy. SUV max is currently equal to 4.22, compared with 21.8 previously. 2. No significant change in size of bilateral common iliac lymph nodes with mild, nonspecific FDG uptake. 3. No new sites of disease identified. 4. Diffusely increased bone marrow activity is new compared with the previous exam and likely reflects treatment related changes.  PAP 01/16/23 AGUS favor cancer  PET scan  01/15/23  - IMPRESSION: Mild residual hypermetabolism in the lower uterine segment, nonspecific. Residual/viable tumor is not excluded.   - Small bilateral common iliac nodes, with associated mild hypermetabolism, suggesting small nodal metastases.  - Healing fractures of the right anterior 3rd and 4th ribs, posttraumatic.  04/10/23- started Doxil  chemotherapy  Feb 2025- vaginal bleeding/spotting x 1 month  She was last seen by Dr. Marella Shams 02/27/23.   02/27/23- Cervix, biopsy,  :       - INVOLVEMENT BY PATIENT'S KNOWN  ENDOMETRIAL CARCINOMA.       - THERE IS SUFFICIENT MATERIAL FOR ANCILLARY MOLECULAR TESTING BLOCK 1A).   At that time, she was felt to not be a good candidate for surgery due to extent of vaginal recurrence and PET positive common iliac nodes in addition to radiation effect in the vagina potentially complicating healing. Additionally, she has significant medical comorbidities and had been falling. We discussed options including doxil  vs clinical trial with TROP2 ADC however she declined trial d/t concerns with transportation.   She received 3 cycles of Doxil .   She was admitted to hospital in late November 2024 for hypoxia secondary to COPD exacerbation. She was treated with steroids and antibiotics.   She began having vaginal bleeding x 1 month ago and declined evaluation. She suffered a fall 06/2023 while leaning forward from chair and suffered contusions, laceration, and hematoma to face.   06/14/23- PET  IMPRESSION: 1. Significant and progressive hypermetabolism in the endometrium consistent with recurrent/progressive endometrial cancer. 2. Enlarging right common iliac node with increasing hypermetabolism consistent with metastatic disease. Other small common iliac and external iliac lymph nodes are stable. 3. No findings for other sites of metastatic disease. 4. New scattered tree-in-bud type nodularity suggesting chronic inflammation or atypical infection such as MAC. This is most notable in the right lung. 5. Aortic atherosclerosis.   Treatment Summary:  03/13/22- 04/18/22- pelvic radiation for extensive vaginal involvement of malignancy 05/15/22- CT C/A/P consistent with residual endometrial primary, borderline common iliac nodes similar to pet and not hypermetabolic. Scattered pulmonary nodules.  06/20/22- Cycle 1 Carbo-Taxol -Keytruda  07/11/22- Cycle 2 Carbo-taxol -keytruda  08/01/22- Cycle 3 Carbo-taxol -keytruda  08/14/22- PET - consistent with response to therapy 08/22/22- Cycle 4  Carbo-taxol -keytruda  09/12/22- Cycle 5 carbo-taxol -keytruda  10/03/22- Cycle 6 carbo-taxol -keytruda  10/12/22- CT C/A/P - unchanged appearance. No worsening disease. Radiation effects.  10/23/22- maintenance keytruda   11/14/22- keytruda  12/05/22- keytruda  12/26/22- keytruda  01/15/23- PET concerning for progressive disease in lower uterine segment and iliac nodes 01/16/23- AGUS favoring cancer Pap 02/06/23- Keytruda  02/27/23- Keytruda ; Foundation One Sent. Second line doxil  discussed. Felt not to be a surgical candidate.  02/27/23- Cervix, biopsy, - endometrial carcinoma 04/10/23- D1C1 - Doxil  50 mg/m2 05/08/23- D1C2 Doxil  50 mg/m2 06/05/23- D1C3 Doxil  50 mg/m2 06/2023- vaginal bleeding 06/14/23- PET- progressive disease of endometrium, enlarging R common iliac node.  07/11/23- D1C1 Taxol  - 3 weeks on, 1 week off + Megace  08/06/23- D1C2 Taxol  + Megace  09/04/23- D1C3 Taxol  + Megace    Genetic Testing  Tumor Testing: Foundation[facility] CDx: 03/06/23- HRDsig Negative, MS stable, TMB 0, PTEN loss exons 4-9, PPP2R1A S256F, TP53 R282W  Testing on 12/28/2021 specimen P53 positive, ER positive  Problem List: Patient Active Problem List   Diagnosis Date Noted   Rash 07/01/2023   Muscle strain of chest wall 07/01/2023   Personal history of fall 04/28/2023   COPD exacerbation (HCC) 03/29/2023   Dyspnea on exertion 02/02/2023   Vitamin D  deficiency 02/02/2023   Vitamin B 12 deficiency 02/02/2023   Need for hepatitis C screening test 02/02/2023   Screening for osteoporosis 02/02/2023   Need for influenza vaccination 02/02/2023   Diabetic eye exam (HCC) 02/02/2023   Aortic atherosclerosis (HCC) 02/02/2023   Bradycardia 01/14/2023   Anemia 09/28/2022   Postmenopausal bleeding 01/30/2022   Endometrial cancer (HCC) 01/25/2022   Mild aortic stenosis 10/12/2020   Essential hypertension 11/05/2017   Closed fracture of left ankle 03/29/2017   Ankle fracture, left 03/26/2017   Acute respiratory failure with hypoxia  (HCC) 08/05/2015   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) 08/19/2013   Obesity, Class II, BMI 35-39.9, with comorbidity 08/19/2013   Chronic obstructive pulmonary disease, unspecified (HCC) 07/31/2012   Atherosclerosis of native arteries of extremity with intermittent claudication (HCC) 03/12/2012   Tobacco use 03/12/2012   Hyperlipidemia associated with type 2 diabetes mellitus (HCC) 06/06/2011   Hypertension associated with diabetes (HCC) 06/06/2011   Mood disorder (HCC) 06/06/2011   Type 2 diabetes with complication (HCC) 06/06/2011    Past Medical History: Past Medical History:  Diagnosis Date   Adenomatous colon polyp    Aortic stenosis    Arthritis    Asthma    Cancer (HCC)    Claudication (HCC)    COPD (chronic obstructive pulmonary disease) (HCC)    Depression    Diabetes mellitus without complication (HCC)    Esophageal dysphagia    GERD (gastroesophageal reflux disease)    Hyperlipemia    Hypertension    Obesity    Severe obesity (BMI 35.0-35.9 with comorbidity) (HCC)     Past Surgical History: Past Surgical History:  Procedure Laterality Date   BLADDER SURGERY     COLONOSCOPY WITH PROPOFOL  N/A 08/05/2015   Procedure: COLONOSCOPY WITH PROPOFOL ;  Surgeon: Luella Sager, MD;  Location: [facility] ENDOSCOPY;  Service: Endoscopy;  Laterality: N/A;   ESOPHAGOGASTRODUODENOSCOPY N/A 01/09/2022   Procedure: ESOPHAGOGASTRODUODENOSCOPY (EGD);  Surgeon: Shane Darling, MD;  Location: [facility] ENDOSCOPY;  Service: Endoscopy;  Laterality: N/A;   FOOT SURGERY Left    FRACTURE SURGERY     IR CV LINE INJECTION  06/22/2022   IR IMAGING GUIDED PORT INSERTION  06/05/2022   IR PORT REPAIR CENTRAL VENOUS  ACCESS DEVICE  07/02/2022   LOWER EXTREMITY ANGIOGRAPHY Left 02/04/2023   Procedure: Lower Extremity Angiography;  Surgeon: Celso College, MD;  Location: [facility] INVASIVE CV LAB;  Service: Cardiovascular;  Laterality: Left;   ORIF ANKLE FRACTURE Left 03/26/2017   Procedure: OPEN REDUCTION INTERNAL FIXATION (ORIF) ANKLE FRACTURE;  Surgeon: Elner Hahn, MD;  Location: [facility] ORS;  Service: Orthopedics;  Laterality: Left;    OB History:  G17P0 OB History  No obstetric history on file.    Family History: Family History  Problem Relation Age of Onset   CVA Mother    Diabetes Mother    CAD Father    Breast cancer Neg Hx     Social History: Social History   Socioeconomic History   Marital status: Single    Spouse name: Not on file   Number of children: Not on file   Years of education: Not on file   Highest education level: Not on file  Occupational History   Not on file  Tobacco Use   Smoking status: Former    Current packs/day: 1.00    Average packs/day: 1 pack/day for 40.0 years (40.0 ttl pk-yrs)    Types: Cigarettes   Smokeless tobacco: Never   Tobacco comments:    Quite 2015  Vaping Use   Vaping status: Never Used  Substance and Sexual Activity   Alcohol use: No   Drug use: No   Sexual activity: Not on file  Other Topics Concern   Not on file  Social History Narrative   Lives alone.  Livia Riffle, here with  her today.  Indoor cats.     Social Drivers of Corporate investment banker Strain: Low Risk  (12/20/2022)   Received from [facility]   Overall Financial Resource Strain (CARDIA)    Difficulty of Paying Living Expenses: Not very hard  Food Insecurity: No Food Insecurity (12/20/2022)   Received from [facility]   Hunger Vital Sign    Worried About Running Out of Food in the Last Year: Never true    Ran Out of Food in the Last Year: Never true  Transportation Needs: Unknown (12/20/2022)   Received from [facility] - Transportation    In the past 12 months, has lack of transportation kept you from medical appointments or from getting medications?: No    Lack of Transportation (Non-Medical): Not on file  Physical Activity: Inactive (09/20/2022)   Exercise Vital Sign    Days of Exercise per Week: 0 days    Minutes of Exercise per Session: 0 min  Stress: Stress Concern Present (09/20/2022)   [employer] of Occupational Health - Occupational Stress Questionnaire    Feeling of Stress : To some extent  Social Connections: Socially Isolated (09/20/2022)   Social Connection and Isolation Panel [NHANES]    Frequency of Communication with Friends and Family: Once a week    Frequency of Social Gatherings with Friends and Family: Never    Attends Religious Services: Never    Database administrator or Organizations: No    Attends Banker Meetings: Never    Marital Status: Never married  Intimate Partner Violence: Not At Risk (05/30/2022)   Humiliation, Afraid, Rape, and Kick questionnaire    Fear of Current or Ex-Partner: No    Emotionally Abused: No    Physically Abused: No    Sexually Abused: No    Allergies: No Known Allergies  Current Medications: Current Outpatient Medications  Medication Sig Dispense Refill   albuterol  (VENTOLIN  HFA) 108 (90 Base) MCG/ACT inhaler Inhale 2 puffs into the lungs every 6 (six) hours as needed for wheezing or shortness of breath. 1 each 1   amLODipine  (NORVASC ) 10 MG tablet Take 10 mg by mouth daily.     aspirin  EC 81 MG tablet Take 1 tablet (81 mg total) by mouth daily. Swallow whole. 150 tablet 2   atorvastatin  (LIPITOR) 80 MG tablet Take 80 mg by mouth daily.     buPROPion  (WELLBUTRIN  XL) 300 MG 24 hr tablet TAKE 1 TABLET BY MOUTH DAILY 90 tablet 3   Cholecalciferol  (VITAMIN D3) 50 MCG (2000 UT) capsule Take 2,000 Units by mouth daily.     citalopram  (CELEXA ) 20 MG tablet Take 20 mg by mouth daily.     cloNIDine  (CATAPRES ) 0.2 MG  tablet Take 0.2 mg by mouth 2 (two) times daily.     clopidogrel  (PLAVIX ) 75 MG tablet Take 1 tablet (75 mg total) by mouth daily. 90 tablet 3   Continuous Glucose Receiver (FREESTYLE LIBRE 3 READER) DEVI Use to check blood glucose continuously. Dx: E11.8, Z79.4 1 each 0   Continuous Glucose Sensor (FREESTYLE LIBRE 3 PLUS SENSOR) MISC Change sensor every 15 days. E11.8, Z79.4. 6 each 3   cyanocobalamin  (VITAMIN B12) 1000 MCG tablet Take 1 tablet (1,000 mcg total) by mouth daily. 90 tablet 3   doxycycline  (VIBRA -TABS) 100 MG tablet Take 1 tablet (100 mg total) by mouth 2 (two) times daily. (Patient not taking: Reported on 09/18/2023) 14 tablet 0  fluticasone -salmeterol (WIXELA INHUB) 250-50 MCG/ACT AEPB Inhale 1 puff into the lungs in the morning and at bedtime. 180 each 6   glipiZIDE  (GLUCOTROL  XL) 5 MG 24 hr tablet TAKE 1 TABLET BY MOUTH DAILY 30 tablet 1   insulin  aspart (NOVOLOG ) 100 UNIT/ML injection Inject 7 Units into the skin 3 (three) times daily before meals.     insulin  degludec (TRESIBA FLEXTOUCH) 100 UNIT/ML FlexTouch Pen Inject 25 Units into the skin daily. 25 units once daily - REPLACES Levemir      Iron , Ferrous Sulfate , 325 (65 Fe) MG TABS Take 325 mg by mouth every other day. 90 tablet 3   losartan -hydrochlorothiazide  (HYZAAR) 100-25 MG tablet Take 1 tablet by mouth daily.     megestrol  (MEGACE ) 40 MG tablet Take 1 tablet (40 mg total) by mouth daily. 30 tablet 2   metoprolol  succinate (TOPROL -XL) 50 MG 24 hr tablet Take 1 tablet by mouth daily.     montelukast  (SINGULAIR ) 10 MG tablet Take 10 mg by mouth at bedtime.     ondansetron  (ZOFRAN ) 8 MG tablet Take 1 tablet (8 mg total) by mouth every 8 (eight) hours as needed for nausea or vomiting. 30 tablet 0   ONETOUCH VERIO test strip 1 each by Other route 3 (three) times daily. 100 each 10   Semaglutide ,0.25 or 0.5MG /DOS, 2 MG/3ML SOPN Inject 0.5 mg into the skin once a week. 3 mL 3   Tiotropium Bromide Monohydrate  (SPIRIVA  RESPIMAT)  2.5 MCG/ACT AERS Inhale 2 puffs into the lungs daily. 60 each 11   No current facility-administered medications for this visit.   Facility-Administered Medications Ordered in Other Visits  Medication Dose Route Frequency Provider Last Rate Last Admin   0.9 %  sodium chloride  infusion   Intravenous Continuous Shellie Dials, MD   Stopped at 09/18/23 1304    Review of Systems General: generalized fatigue Skin: no complaints Eyes: no complaints HEENT: no complaints Breasts: no complaints Pulmonary: no complaints Cardiac: no complaints Gastrointestinal: reduced appetite. Denies constipation or diarrhea Genitourinary/Sexual: vaginal bleeding Ob/Gyn: no complaints Musculoskeletal: no complaints Hematology: no complaints Neurologic/Psych: depressed mood   Objective:  Physical Examination:  There were no vitals taken for this visit.    ECOG Performance Status: 2  GENERAL: frail appearing elderly female. Unaccompanied.  LUNGS:  Normal respiratory rate. Diminished RLL lung sounds HEART: Systolic murmur.  ABDOMEN:  Soft, nontender.  EXTREMITIES:  No peripheral edema. Atraumatic. No cyanosis SKIN: somewhat ashen NEURO:  Nonfocal. Well oriented.  Appropriate affect.  Pelvic exam: Chaperoned by CMA EGBUS: no lesions Cervix/Vagina: Improved from previous exam but Some irregularity of anterior cervix from 12-2 appears friable. Treated with monsels solution. Hemostasis noted. Streaking dark bloody discharge from cervical os.   BME: Deferred   Lab Review Per Dr Adrian Alba  Radiology Per Dr Adrian Alba    Assessment:  Myron Lona is a 73 y.o. 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  PET scan shows involvement of entire uterus and cervix.  No distant disease. Bilateral common iliac nodes are borderline enlarged, but not PET positive.   Poorly differentiated cancer with TP53  overexpression and negative HPV test so unlikely to be cervical primary. She was hesitant for chemotherapy and elected for radiation in view of her locally advanced disease.  Completed radiation 04/18/22 and on exam still had fleshy cancer involving upper vagina and flush cervix.  CT scan continued to show hypoattenuation of uterus cw persistent  disease, but no evidence of distant disease.  Discussed that she was not a surgical candidate due to extensive upper vaginal involvement with cancer and decision made to start systemic therapy.  She received three cycles of carboplatin /taxol /keytruda  and PET/CT 4/24 showed considerable decrease SUV in the uterus and no distant disease.  On exam 08/15/22 the vaginal disease had regressed dramatically. Completed 6 cycles of carboplain/taxol /keytruda  10/04/22.  CT scan showed no evidence of residual disease. Continued on maintenance Keytruda .     Some vaginal spotting 9/24, but no obvious evidence of disease on exam.  PET scan showed mild activity in lower uterine segment and two high common iliac nodes 7-8 mm. PAP AGUS concerning for cancer. Doxil  x 3 cycles with progressive disease. Now on weekly Taxol - continue.   Active bleeding due to vaginal/cervical disease, treated with Monsel's solution today with excellent hemostasis.   Rash due to Doxil   Cancer is TP53 mutated, MSS and HER2 negative. ER positive  Medical co-morbidities complicating care: obesity, COPD, aortic stenosis, PVD.  Plan:   Problem List Items Addressed This Visit   None Visit Diagnoses       Vaginal bleeding    -  Primary       Monsels applied with good hemostasis. Clinically no evidence of worsening disease. Cervix, previous tumor site, treated today however, suspect bleeding primarily originating from endometrium based on discharge from os. Plan to continue taxol  with Dr Adrian Alba and follow up imaging as recommended. She'll see gyn onc after PET in June. Can consider additional Monsels  vs packing vs emobolization for vaginal bleeding in the future if needed.   Kenney Peacemaker, DNP, AGNP-C, AOCNP [facility] [phone] (clinic)

## 2023-09-19 ENCOUNTER — Encounter: Payer: Self-pay | Admitting: Oncology

## 2023-09-23 ENCOUNTER — Ambulatory Visit: Payer: PPO | Admitting: Family Medicine

## 2023-09-25 ENCOUNTER — Ambulatory Visit (INDEPENDENT_AMBULATORY_CARE_PROVIDER_SITE_OTHER): Admitting: Family Medicine

## 2023-09-25 ENCOUNTER — Encounter: Payer: Self-pay | Admitting: Family Medicine

## 2023-09-25 VITALS — BP 110/58 | HR 57 | Temp 98.4°F | Resp 20 | Ht 62.0 in | Wt 166.2 lb

## 2023-09-25 DIAGNOSIS — E1169 Type 2 diabetes mellitus with other specified complication: Secondary | ICD-10-CM | POA: Diagnosis not present

## 2023-09-25 DIAGNOSIS — E785 Hyperlipidemia, unspecified: Secondary | ICD-10-CM | POA: Diagnosis not present

## 2023-09-25 DIAGNOSIS — E1159 Type 2 diabetes mellitus with other circulatory complications: Secondary | ICD-10-CM | POA: Diagnosis not present

## 2023-09-25 DIAGNOSIS — Z7985 Long-term (current) use of injectable non-insulin antidiabetic drugs: Secondary | ICD-10-CM

## 2023-09-25 DIAGNOSIS — E118 Type 2 diabetes mellitus with unspecified complications: Secondary | ICD-10-CM

## 2023-09-25 DIAGNOSIS — F39 Unspecified mood [affective] disorder: Secondary | ICD-10-CM | POA: Diagnosis not present

## 2023-09-25 DIAGNOSIS — I152 Hypertension secondary to endocrine disorders: Secondary | ICD-10-CM

## 2023-09-25 DIAGNOSIS — Z7984 Long term (current) use of oral hypoglycemic drugs: Secondary | ICD-10-CM | POA: Diagnosis not present

## 2023-09-25 DIAGNOSIS — C541 Malignant neoplasm of endometrium: Secondary | ICD-10-CM | POA: Diagnosis not present

## 2023-09-25 LAB — POCT GLYCOSYLATED HEMOGLOBIN (HGB A1C): Hemoglobin A1C: 6.1 % — AB (ref 4.0–5.6)

## 2023-09-25 NOTE — Progress Notes (Signed)
 SUBJECTIVE:  No chief complaint on file.  HPI Presents for follow up chronic disease management  Discussed the use of AI scribe software for clinical note transcription with the patient, who gave verbal consent to proceed.  History of Present Illness Terri [name] is a 73 year old female with diabetes who presents for follow-up on diabetes management.  She is currently on Ozempic  0.5 mg and Tresiba 22 units, although she initially thought it was 25 units. She experiences issues with her Freestyle [place] sensor detaching and requires assistance from [place] to reattach it. She uses strips to monitor her blood glucose twice daily, with morning readings around 102 and evening readings around 90. She occasionally experiences hypoglycemia, with nocturnal readings as low as 50.  She has a decreased appetite, sometimes lacking the desire to eat, which she attributes to recent chemotherapy. To counteract significant weight loss from over 200 pounds, she consumes chocolate milk and regular ice cream.  She has a history of cancer and has recently completed chemotherapy. She is scheduled for a PET scan on the 13th. She is concerned about the outcomes of her cancer treatment and is considering hospice care if current treatments are ineffective.  She has a history of gastrointestinal issues with metformin , which caused diarrhea for about a year until she discontinued it based on advice from a friend. She is currently on Glipizide , reduced to 5 mg once daily from 10 mg. She occasionally forgets to take her medication, especially in the morning, but tries to take it as soon as she remembers.    PERTINENT PMH / PSH: As above  OBJECTIVE:  BP (!) 110/58   Pulse (!) 57   Temp 98.4 F (36.9 C)   Resp 20   Ht 5\' 2"  (1.575 m)   Wt 166 lb 4 oz (75.4 kg)   SpO2 98%   BMI 30.41 kg/m    Physical Exam Vitals reviewed.  Constitutional:      General: She is not in acute distress.    Appearance: Normal  appearance. She is obese. She is not ill-appearing, toxic-appearing or diaphoretic.  Eyes:     General:        Right eye: No discharge.        Left eye: No discharge.     Conjunctiva/sclera: Conjunctivae normal.  Cardiovascular:     Rate and Rhythm: Normal rate and regular rhythm.     Heart sounds: Normal heart sounds.  Pulmonary:     Effort: Pulmonary effort is normal.     Breath sounds: Normal breath sounds.  Abdominal:     General: Bowel sounds are normal.  Musculoskeletal:        General: Normal range of motion.  Skin:    General: Skin is warm and dry.  Neurological:     General: No focal deficit present.     Mental Status: She is alert and oriented to person, place, and time. Mental status is at baseline.  Psychiatric:        Mood and Affect: Mood normal.        Behavior: Behavior normal.        Thought Content: Thought content normal.        Judgment: Judgment normal.           09/25/2023   11:53 AM 02/25/2023   11:40 AM 01/24/2023    9:29 AM 05/30/2022    9:29 AM  Depression screen PHQ 2/9  Decreased Interest 0 0 0 0  Down,  Depressed, Hopeless 1 1 0 0  PHQ - 2 Score 1 1 0 0  Altered sleeping 2 1 0   Tired, decreased energy 2 1 0   Change in appetite 1 0 0   Feeling bad or failure about yourself  0 0 0   Trouble concentrating 0 0 0   Moving slowly or fidgety/restless 0 0 0   Suicidal thoughts 0 0 0   PHQ-9 Score 6 3 0   Difficult doing work/chores Somewhat difficult Not difficult at all Not difficult at all       09/25/2023   11:53 AM 01/24/2023    9:29 AM  GAD 7 : Generalized Anxiety Score  Nervous, Anxious, on Edge 0 0  Control/stop worrying 0 0  Worry too much - different things 0 0  Trouble relaxing 0 0  Restless 0 0  Easily annoyed or irritable 0 0  Afraid - awful might happen 0 0  Total GAD 7 Score 0 0  Anxiety Difficulty Not difficult at all Not difficult at all    ASSESSMENT/PLAN:  Type 2 diabetes with complication [facility]) Assessment &  Plan: Episodes of hypoglycemia with A1c at 6.7. Current regimen includes Tresiba, Ozempic , and Glipizide . Concerns about weight loss and decreased appetite, possibly due to Ozempic  and cancer treatment. Adjustments made to prevent hypoglycemia. - Decrease Tresiba to 20 units daily. - Follow up with pharmacy in one week for further diabetes management. - Ensure proper use of Freestyle Libre  - Monitor blood glucose twice daily using strips. - Continue Ozempic  0.5 mg  - Discontinue Glipizide  daily.  Orders: -     POCT glycosylated hemoglobin (Hb A1C) -     AMB Referral [facility] Care Management  Hypertension associated with diabetes [facility]) Assessment & Plan: Well controlled. Patient reports adherence to current antihypertensive medications (Amlodipine , Metoprolol , Hyzaar, Clonidine ). -Continue current medication  Orders: -     AMB Referral [facility] Care Management  Mood disorder (HCC) Assessment & Plan: Well controlled  Continue Wellbutrin  XL 300 mg daily and Celexa  20 mg daily    Hyperlipidemia associated with type 2 diabetes mellitus (HCC) Assessment & Plan: Patient is currently on cholesterol medication. -Continue Atorvastatin  80 mg daily   Endometrial cancer (HCC) Assessment & Plan: Completed chemotherapy. Awaiting PET scan to assess status. Concerns about decreased appetite, weight loss, and potential need for hospice care if treatment is ineffective. - Await results of PET scan scheduled for June 13. - Discuss potential hospice care if current treatment is ineffective. - Follows with Oncology      PDMP reviewed  Return in 6 months (on 03/27/2024), or if symptoms worsen or fail to improve, for PCP.  Valli Gaw, MD

## 2023-09-25 NOTE — Patient Instructions (Addendum)
 It was a pleasure meeting you today. Thank you for allowing me to take part in your health care.  Our goals for today as we discussed include:  A1c is 6.1 today  Stop Glipizide   Decrease Tresiba to 20 units Decrease Novolog  to 5 units with meals three times a day Continue Ozempic  0.5 mg weekly  I have reached out to pharmacy to follow up with helping with your insulin  and freestyle   This is a list of the screening recommended for you and due dates:  Health Maintenance  Topic Date Due   COVID-19 Vaccine (1) Never done   Eye exam for diabetics  Never done   Zoster (Shingles) Vaccine (1 of 2) Never done   Medicare Annual Wellness Visit  06/15/2023   Pneumonia Vaccine (2 of 2 - PPSV23) 01/29/2024*   DTaP/Tdap/Td vaccine (1 - Tdap) 04/27/2024*   DEXA scan (bone density measurement)  04/27/2024*   Flu Shot  11/29/2023   Yearly kidney health urinalysis for diabetes  01/24/2024   Complete foot exam   02/25/2024   Hemoglobin A1C  03/27/2024   Screening for Lung Cancer  08/18/2024   Yearly kidney function blood test for diabetes  09/17/2024   Mammogram  09/26/2024   Colon Cancer Screening  08/04/2025   Hepatitis C Screening  Completed   HPV Vaccine  Aged Out   Meningitis B Vaccine  Aged Out  *Topic was postponed. The date shown is not the original due date.      If you have any questions or concerns, please do not hesitate to call the office at [phone].  I look forward to our next visit and until then take care and stay safe.  Regards,   Valli Gaw, MD   [facility]

## 2023-09-26 ENCOUNTER — Other Ambulatory Visit: Payer: Self-pay

## 2023-09-30 ENCOUNTER — Encounter: Payer: Self-pay | Admitting: Oncology

## 2023-09-30 ENCOUNTER — Encounter: Payer: Self-pay | Admitting: Family Medicine

## 2023-09-30 ENCOUNTER — Ambulatory Visit: Payer: Self-pay | Admitting: Family Medicine

## 2023-09-30 NOTE — Assessment & Plan Note (Signed)
 Well controlled  Continue Wellbutrin  XL 300 mg daily and Celexa  20 mg daily

## 2023-09-30 NOTE — Assessment & Plan Note (Signed)
 Completed chemotherapy. Awaiting PET scan to assess status. Concerns about decreased appetite, weight loss, and potential need for hospice care if treatment is ineffective. - Await results of PET scan scheduled for June 13. - Discuss potential hospice care if current treatment is ineffective. - Follows with Oncology

## 2023-09-30 NOTE — Assessment & Plan Note (Signed)
 Well controlled. Patient reports adherence to current antihypertensive medications (Amlodipine , Metoprolol , Hyzaar, Clonidine ). -Continue current medication

## 2023-09-30 NOTE — Assessment & Plan Note (Addendum)
 Episodes of hypoglycemia with A1c at 6.7. Current regimen includes Tresiba, Ozempic , and Glipizide . Concerns about weight loss and decreased appetite, possibly due to Ozempic  and cancer treatment. Adjustments made to prevent hypoglycemia. - Decrease Tresiba to 20 units daily. - Follow up with pharmacy in one week for further diabetes management. - Ensure proper use of Freestyle Libre  - Monitor blood glucose twice daily using strips. - Continue Ozempic  0.5 mg  - Discontinue Glipizide  daily.

## 2023-09-30 NOTE — Assessment & Plan Note (Signed)
 Patient is currently on cholesterol medication. -Continue Atorvastatin  80 mg daily

## 2023-10-01 ENCOUNTER — Other Ambulatory Visit (INDEPENDENT_AMBULATORY_CARE_PROVIDER_SITE_OTHER): Admitting: Pharmacist

## 2023-10-01 DIAGNOSIS — Z794 Long term (current) use of insulin: Secondary | ICD-10-CM

## 2023-10-01 DIAGNOSIS — E118 Type 2 diabetes mellitus with unspecified complications: Secondary | ICD-10-CM

## 2023-10-01 NOTE — Progress Notes (Signed)
 10/01/2023 Name: Terri [name] MRN: [id number] DOB: 31-Mar-1951  Subjective  Chief Complaint  Patient presents with   Diabetes   Reason for visit: ?  Terri [name] is a 73 y.o. female with a history of diabetes (type 2), who presents today for a follow up visit for the management of diabetes.   Pertinent PMH also includes atherosclerosis w intermittent claudication, HTN, COPD, HLD, Endometrial cancer, obesity.  Known DM Complications:  nephropathy w microalbuminuria; PAD with claudication BLE; HTN    Care Team: Primary Care Provider: Valli Gaw, MD Pulmonology Dr. Darnelle Elders; Oncology Dr. Adrian Alba; Cardiology at [facility]  Last Diabetes-Related Visit: With PCP 09/25/23 Recent Summary of Change: 5/28: A1c 6.7%. BG all within goal though with occasional overnight lows, as low as 50s. ??Tresiba to 20 u/d. Stop glipizide .  ??Tresiba 22 units daily (12%??). 25 units on day of chemo and day following chemo.   Since Last visit / History of Present Illness: ?  Patient reports doing well overall. Has finished most recent chemo regimen and notes she is scheduled to have a repeat scan ~13th next week.   Continues to tolerate Ozempic  well without issues. Continued appetite reduction at baseline since chemo started, not changed from previous dose of Ozempic . Feels food intake has remained the same since completing most recent chemo course, has not increased. Weight is stable.   Confirms discontinuation of glipizide  last week with insulin  reduction as instructed in the setting of overnight lows.   Reported DM Regimen: ?  semaglutide  (Ozempic ) 0.5 mg weekly Novolog  (aspart) 5 units up to three times daily before meals Tresiba U100 20 units daily  DM medications tried in the past:?  Metformin  (diarrhea); switched to glipizide  Glipizide  (D/c 08/2023 due to increasing hypoglycemia)  SMBG: Glucometer - Checks BG twice daily via glucometer 5-6 pm (pre-dinner): Higher since stopping glipizide , though typically  pre-dinner readings are ranging 160s. Last night 169, highest reading 199 (2/2 candy) FBG: 102 mg/dL this morning    Hypo/Hyperglycemia: ?  Symptoms of hypoglycemia since last visit:? no. None since dose adjustments last week.  If yes, it was treated by: n/a   Reported Diet:  Patient typically eats 3 meals per day. S/p chemo, she reports her appetite has not been the same. Consuming less than previous. Notes she still attempts to eat something even if not hungry.  Breakfast (8:30): Varies Lunch (12-1pm): Pizza (under 50 carbs); Sandwich; Chicken salad+crackers Dinner (7-7:30 pm): Largest meal of the day Snacks: Sweet tooth (states that most sweets are sugar-free though in more recent weeks, with her low Bgs she has been having more ice cream and pound cakes. No more lows the past week, plans to discontinue this)  Exercise: No  DM Prevention:  Statin: Taking; high intensity.?  History of chronic kidney disease? yes History of albuminuria? yes, last UACR on 01/24/23 = 100.4 mg/g ACE/ARB - Taking; Urine MA/CR Ratio - elevated urinary albumin excretion.  Last foot exam: 02/25/2023 Tobacco Use: Former   Cardiovascular Risk Reduction History of clinical ASCVD? PAD w Claudication The 10-year ASCVD risk score (Arnett DK, et al., 2019) is: 22.9% History of heart failure? no History of hyperlipidemia? yes Current BMI: 30.4 kg/m2 (Ht 62 in, Wt 75.4 kg) Taking statin? yes; high intensity (atorvastaitn 80 mg) Taking aspirin ? indicated (secondary prevention); Taking clopidogrel    Taking SGLT-2i? no Taking GLP- 1 RA? yes    _______________________________________________  Objective    Review of Systems:? Limited in the setting of virtual visit  GI:? No nausea,  vomiting, constipation, diarrhea, abdominal pain, dyspepsia, change in bowel habits  Endocrine:? No polyuria, polyphagia or blurred vision    Physical Examination:  Vitals:  Wt Readings from Last 3 Encounters:  09/25/23 166 lb 4 oz  (75.4 kg)  09/18/23 160 lb (72.6 kg)  09/11/23 165 lb 6.4 oz (75 kg)   BP Readings from Last 3 Encounters:  09/25/23 (!) 110/58  09/18/23 (!) 129/54  09/11/23 (!) 150/63   Pulse Readings from Last 3 Encounters:  09/25/23 (!) 57  09/18/23 64  09/11/23 69   Labs:?  Lab Results  Component Value Date   HGBA1C 6.1 (A) 09/25/2023   HGBA1C 5.7 (A) 06/26/2023   HGBA1C 7.2 (H) 12/25/2022   GLUCOSE 164 (H) 09/18/2023   MICRALBCREAT 10.4 01/24/2023   CREATININE 0.80 09/18/2023   CREATININE 1.00 09/11/2023   CREATININE 1.00 09/04/2023   GFR 52.91 (L) 01/24/2023   Lab Results  Component Value Date   CHOL 156 01/24/2023   LDLCALC 87 01/24/2023   HDL 46.20 01/24/2023   TRIG 112.0 01/24/2023   ALT 14 09/18/2023   ALT 18 09/11/2023   AST 17 09/18/2023   AST 16 09/11/2023     Chemistry      Component Value Date/Time   NA 138 09/18/2023 0926   K 3.6 09/18/2023 0926   CL 104 09/18/2023 0926   CO2 29 09/18/2023 0926   BUN 30 (H) 09/18/2023 0926   CREATININE 0.80 09/18/2023 0926      Component Value Date/Time   CALCIUM  8.1 (L) 09/18/2023 0926   ALKPHOS 65 09/18/2023 0926   AST 17 09/18/2023 0926   ALT 14 09/18/2023 0926   BILITOT 0.7 09/18/2023 0926     The 10-year ASCVD risk score (Arnett DK, et al., 2019) is: 22.9%   Assessment and Plan:   1. Diabetes, type 2: controlled A1c 6.1% (09/25/23), goal <7.5-8% without hypoglycemia given medical complexity and active cancer treatment. Since stopping glipizide  and reducing insulin , denied further instances of lows or s/sx lows. Today, fasting sugars are at goal without readings in the 70s. Weight has been stable at recent visits, goal to maintain.   Current regimen: Ozempic  0.5 mg/wk, Tresiba 20 units daily, Novolog  5 units TIDAC Continue medications today without changes. Continue BID blood glucose checks Patient would like to schedule another CGM visit after her PET scan next week.  Reviewed s/sx/tx hypoglycemia. Patient will  reduce her ice cream/cake intake now that lows have resolved.   Future Consideration: SGLT2i: Ideal agent esp in the setting of diabetic kidney disease w albuminuria >30 mg/g depending on goals of care in the setting of her current cancer treatment Does have hx urinary incontinence, though don't expect significant diuresis with A1c 5.7%. Ideally would allow for d/c glipizide  and possibly reduction in insulin  dose(s).  Will likely qualify for free SGLT2 through her inhaler PAP TZD: Avoiding due to possible weight gain/increase in fracture risk. Metformin : Hx intolerance   Follow up PharmD follow up via phone 2-3 weeks to schedule CGM visit per patient preference.  PCP TOC scheduled 4 months   Future Appointments  Date Time Provider Department Center  10/11/2023 10:30 AM [facility] None  10/11/2023 11:30 AM [facility]  10/14/2023 11:00 AM [facility] PHARMACIST [facility]  10/15/2023 11:30 AM [facility] ANNUAL WELLNESS VISIT [facility]  10/16/2023  7:30 AM [facility] None  10/16/2023  8:15 AM [facility] FLUSH [facility] None  10/16/2023  8:30 AM [facility] GYN ONC [facility] None  10/16/2023  9:30 AM Shellie Dials, MD [facility] None  10/16/2023  9:45 AM [facility] INFUSION CHAIR 7 [facility] None  10/23/2023  9:00 AM [facility] None  10/23/2023 10:00 AM [facility] FLUSH [facility] None  10/23/2023 10:30 AM Shellie Dials, MD [facility] None  10/23/2023 11:00 AM [facility] INFUSION CHAIR 9 [facility] None  10/30/2023  7:30 AM [facility] None  10/30/2023  8:15 AM [facility] FLUSH [facility] None  10/30/2023  8:30 AM Shellie Dials, MD [facility] None  10/30/2023  9:00 AM [facility] INFUSION CHAIR 7 [facility] None  11/07/2023 11:00 AM Vergia Glasgow, MD [facility] None  12/17/2023  2:00 PM [facility] VASC 2 [facility] None  12/17/2023  3:00 PM Dew, Donald Frost, MD [facility] None  02/28/2024 11:20 AM Bluford Burkitt, NP [facility]   Daron Ellen,  PharmD Clinical Pharmacist [facility] [phone]

## 2023-10-02 ENCOUNTER — Ambulatory Visit: Admitting: Oncology

## 2023-10-02 ENCOUNTER — Other Ambulatory Visit

## 2023-10-02 ENCOUNTER — Ambulatory Visit

## 2023-10-02 ENCOUNTER — Encounter

## 2023-10-08 DIAGNOSIS — R200 Anesthesia of skin: Secondary | ICD-10-CM | POA: Diagnosis not present

## 2023-10-09 ENCOUNTER — Ambulatory Visit

## 2023-10-09 ENCOUNTER — Encounter

## 2023-10-09 ENCOUNTER — Ambulatory Visit: Admitting: Oncology

## 2023-10-09 ENCOUNTER — Other Ambulatory Visit

## 2023-10-09 ENCOUNTER — Encounter: Payer: Self-pay | Admitting: Oncology

## 2023-10-10 DIAGNOSIS — M51369 Other intervertebral disc degeneration, lumbar region without mention of lumbar back pain or lower extremity pain: Secondary | ICD-10-CM | POA: Diagnosis not present

## 2023-10-10 DIAGNOSIS — Z1331 Encounter for screening for depression: Secondary | ICD-10-CM | POA: Diagnosis not present

## 2023-10-10 DIAGNOSIS — R29898 Other symptoms and signs involving the musculoskeletal system: Secondary | ICD-10-CM | POA: Diagnosis not present

## 2023-10-10 DIAGNOSIS — G629 Polyneuropathy, unspecified: Secondary | ICD-10-CM | POA: Diagnosis not present

## 2023-10-10 DIAGNOSIS — R296 Repeated falls: Secondary | ICD-10-CM | POA: Diagnosis not present

## 2023-10-11 ENCOUNTER — Inpatient Hospital Stay

## 2023-10-11 ENCOUNTER — Ambulatory Visit
Admission: RE | Admit: 2023-10-11 | Discharge: 2023-10-11 | Disposition: A | Discharge status: Home / Self Care | Source: Ambulatory Visit | Attending: Oncology | Admitting: Oncology

## 2023-10-11 ENCOUNTER — Encounter: Payer: Self-pay | Admitting: Oncology

## 2023-10-11 DIAGNOSIS — C541 Malignant neoplasm of endometrium: Secondary | ICD-10-CM

## 2023-10-14 ENCOUNTER — Other Ambulatory Visit (INDEPENDENT_AMBULATORY_CARE_PROVIDER_SITE_OTHER)

## 2023-10-14 DIAGNOSIS — Z794 Long term (current) use of insulin: Secondary | ICD-10-CM

## 2023-10-14 DIAGNOSIS — E118 Type 2 diabetes mellitus with unspecified complications: Secondary | ICD-10-CM

## 2023-10-14 NOTE — Progress Notes (Signed)
 10/21/2023 Name: Terri [name] MRN: [id number] DOB: Apr 08, 1951  Subjective  Chief Complaint  Patient presents with   Diabetes   Reason for visit: ?  Terri [name] is a 73 y.o. female with a history of diabetes (type 2), who presents today for a follow up visit for the management of diabetes.   Pertinent PMH also includes atherosclerosis w intermittent claudication, HTN, COPD, HLD, Endometrial cancer, obesity.  Known DM Complications:  nephropathy w microalbuminuria; PAD with claudication BLE; HTN    Care Team: Primary Care Provider: Hope Merle, MD Pulmonology Dr. Isadora; Oncology Dr. Jacobo; [facility]iology at [facility]  Last Diabetes-Related Visit: With PCP 09/25/23 Recent Summary of Change: 5/28: A1c 6.7%. BG all within goal though with occasional overnight lows, as low as 50s. ??Tresiba to 20 u/d. Stop glipizide .  ??Tresiba 22 units daily (12%??). 25 units on day of chemo and day following chemo.   Since Last visit / History of Present Illness: ?  Patient reports doing well overall. Has finished most recent chemo regimen and notes she is scheduled to have a repeat scan ~13th next week.   Continues to tolerate Ozempic  well without issues. Continued appetite reduction at baseline since chemo started, not changed from previous dose of Ozempic . Feels food intake has remained the same since completing most recent chemo course, has not increased. Weight is stable.   Confirms discontinuation of glipizide  last week with insulin  reduction as instructed in the setting of overnight lows.   Reported DM Regimen: ?  semaglutide  (Ozempic ) 0.5 mg weekly Novolog  (aspart) 5 units up to three times daily before meals (sometimes 7 units if BG high prior to eating) [place] U100 20 units daily  DM medications tried in the past:?  Metformin  (diarrhea); switched to glipizide  Glipizide  (D/c 08/2023 due to increasing hypoglycemia)  SMBG: Glucometer - Checks BG twice daily via glucometer 5-6 pm (pre-dinner):   Lowest 70, highest 162 mg/dL. Most 122, 155, 145, 158, 162, 135, 85, 137 FBG: Highest 152, lowest 71, 117, 122, 131, 136   Hypo/Hyperglycemia: ?  Symptoms of hypoglycemia since last visit:? no. None in several weeks at this point  If yes, it was treated by: n/a   Reported Diet:  Patient typically eats 3 meals per day. S/p chemo, she reports her appetite has not been the same. Consuming less than previous. Notes she still attempts to eat something even if not hungry.  Breakfast (8:30): Varies Lunch (12-1pm): Pizza (under 50 carbs); Sandwich; Chicken salad+crackers Dinner (7-7:30 pm): Largest meal of the day Snacks: Sweet tooth (states that most sweets are sugar-free though in more recent weeks, with her low Bgs she has been having more ice cream and pound cakes. No more lows the past week, plans to discontinue this)  Exercise: No  DM Prevention:  Statin: Taking; high intensity.?  History of chronic kidney disease? yes History of albuminuria? yes, last UACR on 01/24/23 = 100.4 mg/g ACE/ARB - Taking; Urine MA/CR Ratio - elevated urinary albumin excretion.  Last foot exam: 02/25/2023 Tobacco Use: Former   Cardiovascular Risk Reduction History of clinical ASCVD? PAD w Claudication The 10-year ASCVD risk score (Arnett DK, et al., 2019) is: 48.8% History of heart failure? no History of hyperlipidemia? yes Current BMI: 30.4 kg/m2 (Ht 62 in, Wt 75.4 kg) Taking statin? yes; high intensity (atorvastaitn 80 mg) Taking aspirin ? indicated (secondary prevention); Taking clopidogrel    Taking SGLT-2i? no Taking GLP- 1 RA? yes    _______________________________________________  Objective    Review of Systems:? Limited in  the setting of virtual visit  GI:? No nausea, vomiting, constipation, diarrhea, abdominal pain, dyspepsia, change in bowel habits  Endocrine:? No polyuria, polyphagia or blurred vision    Physical Examination:  Vitals:  Wt Readings from Last 3 Encounters:  10/16/23 157 lb 8  oz (71.4 kg)  09/25/23 166 lb 4 oz (75.4 kg)  09/18/23 160 lb (72.6 kg)   BP Readings from Last 3 Encounters:  10/16/23 (!) 142/67  10/16/23 (!) 160/66  09/25/23 (!) 110/58   Pulse Readings from Last 3 Encounters:  10/16/23 68  10/16/23 67  09/25/23 (!) 57   Labs:?  Lab Results  Component Value Date   HGBA1C 6.1 (A) 09/25/2023   HGBA1C 5.7 (A) 06/26/2023   HGBA1C 7.2 (H) 12/25/2022   GLUCOSE 159 (H) 10/16/2023   MICRALBCREAT 10.4 01/24/2023   CREATININE 0.86 10/16/2023   CREATININE 0.80 09/18/2023   CREATININE 1.00 09/11/2023   GFR 52.91 (L) 01/24/2023   Lab Results  Component Value Date   CHOL 156 01/24/2023   LDLCALC 87 01/24/2023   HDL 46.20 01/24/2023   TRIG 112.0 01/24/2023   ALT 10 10/16/2023   ALT 14 09/18/2023   AST 15 10/16/2023   AST 17 09/18/2023     Chemistry      Component Value Date/Time   NA 142 10/16/2023 0827   K 3.5 10/16/2023 0827   CL 106 10/16/2023 0827   CO2 29 10/16/2023 0827   BUN 20 10/16/2023 0827   CREATININE 0.86 10/16/2023 0827      Component Value Date/Time   CALCIUM  8.5 (L) 10/16/2023 0827   ALKPHOS 77 10/16/2023 0827   AST 15 10/16/2023 0827   ALT 10 10/16/2023 0827   BILITOT 0.9 10/16/2023 0827     The 10-year ASCVD risk score (Arnett DK, et al., 2019) is: 48.8%   Assessment and Plan:   1. Diabetes, type 2: controlled A1c 6.1% (09/25/23), goal <7.5-8% without hypoglycemia given medical complexity and active cancer treatment. Sugars have remained stable over the past several weeks without any further instances of hypoglcyemia. Lowest reading in recent weeks 70 mg/dL. FBG ranging 80s-130s. Evening sugars all <180 mg/dL. Weight has been stable at recent visits, goal to maintain.  No medicine changes today.  Current regimen: Ozempic  0.5 mg/wk, Tresiba 20 units daily, Novolog  5 units TIDAC Continue medications today without changes. Continue BID blood glucose checks Patient would like to schedule another CGM visit after her  PET (previous was rescheduled 2/2 pt drinking juice the morning of) Reviewed s/sx/tx hypoglycemia. Patient will reduce her ice cream/cake intake now that lows have resolved. Has sugar-free ice cream.   Future Consideration: SGLT2i: Ideal agent esp in the setting of diabetic kidney disease w albuminuria >30 mg/g depending on goals of care in the setting of her current cancer treatment Does have hx urinary incontinence, though don't expect significant diuresis with A1c so low. Ideally would allow for reduction in insulin  dose(s).  Will likely qualify for free SGLT2 through her inhaler PAP TZD: Avoiding due to possible weight gain/increase in fracture risk. Metformin : Hx intolerance   Follow up Phone visit with PharmD 3-4 weeks.  Patient provided with direct line for questions/concerns prior to next follow up visit.  PCP TOC scheduled for October.    Terri [name], PharmD Clinical Pharmacist [facility] [phone]

## 2023-10-15 ENCOUNTER — Ambulatory Visit

## 2023-10-15 ENCOUNTER — Other Ambulatory Visit: Payer: Self-pay

## 2023-10-15 ENCOUNTER — Encounter
Admission: RE | Admit: 2023-10-15 | Discharge: 2023-10-15 | Disposition: A | Discharge status: Home / Self Care | Source: Ambulatory Visit | Attending: Oncology | Admitting: Oncology

## 2023-10-15 ENCOUNTER — Inpatient Hospital Stay: Attending: Obstetrics and Gynecology

## 2023-10-15 DIAGNOSIS — E876 Hypokalemia: Secondary | ICD-10-CM | POA: Insufficient documentation

## 2023-10-15 DIAGNOSIS — N289 Disorder of kidney and ureter, unspecified: Secondary | ICD-10-CM | POA: Insufficient documentation

## 2023-10-15 DIAGNOSIS — D72819 Decreased white blood cell count, unspecified: Secondary | ICD-10-CM | POA: Insufficient documentation

## 2023-10-15 DIAGNOSIS — C541 Malignant neoplasm of endometrium: Secondary | ICD-10-CM | POA: Diagnosis not present

## 2023-10-15 DIAGNOSIS — D649 Anemia, unspecified: Secondary | ICD-10-CM | POA: Insufficient documentation

## 2023-10-15 DIAGNOSIS — E1165 Type 2 diabetes mellitus with hyperglycemia: Secondary | ICD-10-CM | POA: Insufficient documentation

## 2023-10-15 DIAGNOSIS — Z5111 Encounter for antineoplastic chemotherapy: Secondary | ICD-10-CM | POA: Insufficient documentation

## 2023-10-15 LAB — GLUCOSE, CAPILLARY: Glucose-Capillary: 111 mg/dL — ABNORMAL HIGH (ref 70–99)

## 2023-10-15 MED ORDER — FLUDEOXYGLUCOSE F - 18 (FDG) INJECTION
8.6000 | Freq: Once | INTRAVENOUS | Status: AC | PRN
  Administered 2023-10-15: 9.47 via INTRAVENOUS

## 2023-10-16 ENCOUNTER — Inpatient Hospital Stay (HOSPITAL_BASED_OUTPATIENT_CLINIC_OR_DEPARTMENT_OTHER): Admitting: Oncology

## 2023-10-16 ENCOUNTER — Encounter

## 2023-10-16 ENCOUNTER — Encounter: Payer: Self-pay | Admitting: Oncology

## 2023-10-16 ENCOUNTER — Inpatient Hospital Stay

## 2023-10-16 ENCOUNTER — Telehealth: Payer: Self-pay

## 2023-10-16 ENCOUNTER — Other Ambulatory Visit

## 2023-10-16 ENCOUNTER — Ambulatory Visit

## 2023-10-16 ENCOUNTER — Ambulatory Visit: Admitting: Oncology

## 2023-10-16 ENCOUNTER — Inpatient Hospital Stay (HOSPITAL_BASED_OUTPATIENT_CLINIC_OR_DEPARTMENT_OTHER): Admitting: Obstetrics and Gynecology

## 2023-10-16 VITALS — BP 142/67 | HR 68 | Resp 16

## 2023-10-16 VITALS — BP 160/66 | HR 67 | Temp 96.8°F | Resp 16 | Wt 157.5 lb

## 2023-10-16 DIAGNOSIS — C541 Malignant neoplasm of endometrium: Secondary | ICD-10-CM | POA: Diagnosis not present

## 2023-10-16 DIAGNOSIS — Z5111 Encounter for antineoplastic chemotherapy: Secondary | ICD-10-CM | POA: Diagnosis not present

## 2023-10-16 DIAGNOSIS — D649 Anemia, unspecified: Secondary | ICD-10-CM | POA: Diagnosis not present

## 2023-10-16 DIAGNOSIS — E1165 Type 2 diabetes mellitus with hyperglycemia: Secondary | ICD-10-CM | POA: Diagnosis not present

## 2023-10-16 DIAGNOSIS — D72819 Decreased white blood cell count, unspecified: Secondary | ICD-10-CM | POA: Diagnosis not present

## 2023-10-16 DIAGNOSIS — N289 Disorder of kidney and ureter, unspecified: Secondary | ICD-10-CM | POA: Diagnosis not present

## 2023-10-16 DIAGNOSIS — E876 Hypokalemia: Secondary | ICD-10-CM | POA: Diagnosis not present

## 2023-10-16 LAB — CBC WITH DIFFERENTIAL (CANCER CENTER ONLY)
Abs Immature Granulocytes: 0.02 10*3/uL (ref 0.00–0.07)
Basophils Absolute: 0 10*3/uL (ref 0.0–0.1)
Basophils Relative: 0 %
Eosinophils Absolute: 0.1 10*3/uL (ref 0.0–0.5)
Eosinophils Relative: 3 %
HCT: 29.8 % — ABNORMAL LOW (ref 36.0–46.0)
Hemoglobin: 9.3 g/dL — ABNORMAL LOW (ref 12.0–15.0)
Immature Granulocytes: 0 %
Lymphocytes Relative: 7 %
Lymphs Abs: 0.3 10*3/uL — ABNORMAL LOW (ref 0.7–4.0)
MCH: 28.4 pg (ref 26.0–34.0)
MCHC: 31.2 g/dL (ref 30.0–36.0)
MCV: 91.1 fL (ref 80.0–100.0)
Monocytes Absolute: 0.4 10*3/uL (ref 0.1–1.0)
Monocytes Relative: 8 %
Neutro Abs: 4 10*3/uL (ref 1.7–7.7)
Neutrophils Relative %: 82 %
Platelet Count: 163 10*3/uL (ref 150–400)
RBC: 3.27 MIL/uL — ABNORMAL LOW (ref 3.87–5.11)
RDW: 17.1 % — ABNORMAL HIGH (ref 11.5–15.5)
WBC Count: 4.9 10*3/uL (ref 4.0–10.5)
nRBC: 0 % (ref 0.0–0.2)

## 2023-10-16 LAB — CMP (CANCER CENTER ONLY)
ALT: 10 U/L (ref 0–44)
AST: 15 U/L (ref 15–41)
Albumin: 3.3 g/dL — ABNORMAL LOW (ref 3.5–5.0)
Alkaline Phosphatase: 77 U/L (ref 38–126)
Anion gap: 7 (ref 5–15)
BUN: 20 mg/dL (ref 8–23)
CO2: 29 mmol/L (ref 22–32)
Calcium: 8.5 mg/dL — ABNORMAL LOW (ref 8.9–10.3)
Chloride: 106 mmol/L (ref 98–111)
Creatinine: 0.86 mg/dL (ref 0.44–1.00)
GFR, Estimated: >60 mL/min (ref >60)
Glucose, Bld: 159 mg/dL — ABNORMAL HIGH (ref 70–99)
Potassium: 3.5 mmol/L (ref 3.5–5.1)
Sodium: 142 mmol/L (ref 135–145)
Total Bilirubin: 0.9 mg/dL (ref 0.0–1.2)
Total Protein: 5.6 g/dL — ABNORMAL LOW (ref 6.5–8.1)

## 2023-10-16 LAB — SAMPLE TO BLOOD BANK

## 2023-10-16 MED ORDER — DEXAMETHASONE SODIUM PHOSPHATE 10 MG/ML IJ SOLN
10.0000 mg | Freq: Once | INTRAMUSCULAR | Status: AC
  Administered 2023-10-16: 10 mg via INTRAVENOUS
  Filled 2023-10-16: qty 1

## 2023-10-16 MED ORDER — FAMOTIDINE IN NACL 20-0.9 MG/50ML-% IV SOLN
20.0000 mg | Freq: Once | INTRAVENOUS | Status: AC
  Administered 2023-10-16: 20 mg via INTRAVENOUS
  Filled 2023-10-16: qty 50

## 2023-10-16 MED ORDER — HEPARIN SOD (PORK) LOCK FLUSH 100 UNIT/ML IV SOLN
500.0000 [IU] | Freq: Once | INTRAVENOUS | Status: DC | PRN
Start: 2023-10-16 — End: 2023-10-16
  Filled 2023-10-16: qty 5

## 2023-10-16 MED ORDER — DIPHENHYDRAMINE HCL 50 MG/ML IJ SOLN
25.0000 mg | Freq: Once | INTRAMUSCULAR | Status: AC
  Administered 2023-10-16: 25 mg via INTRAVENOUS
  Filled 2023-10-16: qty 1

## 2023-10-16 MED ORDER — SODIUM CHLORIDE 0.9 % IV SOLN
80.0000 mg/m2 | Freq: Once | INTRAVENOUS | Status: AC
  Administered 2023-10-16: 150 mg via INTRAVENOUS
  Filled 2023-10-16: qty 25

## 2023-10-16 MED ORDER — SODIUM CHLORIDE 0.9 % IV SOLN
INTRAVENOUS | Status: DC
  Filled 2023-10-16: qty 250

## 2023-10-16 NOTE — Telephone Encounter (Signed)
 Left message to return call to our office.    Patient assistance meds are available for pick up      Medication: Ozempic  .25/.50 2MG /   QTY: 4 boxes  LOT [id number] Expired 11/27/2025      Medication: Horace Lye  FlexTouch100units/ML Prefilled pen   QTY: 2 Box  LOT:  [id number] Expires 01/27/2026  2 bags have been labeled and placed in designated pick up area  We have limited space for medication please pick up at your earliest convenience.   Adelle Hong, CMA

## 2023-10-16 NOTE — Progress Notes (Signed)
 Pt in for GYN follow up reports she did not take her meds this morning but has pills with her. Pt reports feeling fatigued and short of breath. Pt states she had daily vaginal bleeding, bright red to [place]. Changes pad once a day.

## 2023-10-16 NOTE — Patient Instructions (Signed)
 [facility] - A DEPT OF [facility]  Discharge Instructions: Thank you for choosing [facility] to provide your oncology and hematology care.  If you have a lab appointment with the Cancer Center, please go directly to the Cancer Center and check in at the registration area.  Wear comfortable clothing and clothing appropriate for easy access to any Portacath or PICC line.   We strive to give you quality time with your provider. You may need to reschedule your appointment if you arrive late (15 or more minutes).  Arriving late affects you and other patients whose appointments are after yours.  Also, if you miss three or more appointments without notifying the office, you may be dismissed from the clinic at the provider's discretion.      For prescription refill requests, have your pharmacy contact our office and allow 72 hours for refills to be completed.    Today you received the following chemotherapy and/or immunotherapy agents Taxol.      To help prevent nausea and vomiting after your treatment, we encourage you to take your nausea medication as directed.  BELOW ARE SYMPTOMS THAT SHOULD BE REPORTED IMMEDIATELY: *FEVER GREATER THAN 100.4 F (38 C) OR HIGHER *CHILLS OR SWEATING *NAUSEA AND VOMITING THAT IS NOT CONTROLLED WITH YOUR NAUSEA MEDICATION *UNUSUAL SHORTNESS OF BREATH *UNUSUAL BRUISING OR BLEEDING *URINARY PROBLEMS (pain or burning when urinating, or frequent urination) *BOWEL PROBLEMS (unusual diarrhea, constipation, pain near the anus) TENDERNESS IN MOUTH AND THROAT WITH OR WITHOUT PRESENCE OF ULCERS (sore throat, sores in mouth, or a toothache) UNUSUAL RASH, SWELLING OR PAIN  UNUSUAL VAGINAL DISCHARGE OR ITCHING   Items with * indicate a potential emergency and should be followed up as soon as possible or go to the Emergency Department if any problems should occur.  Please show the CHEMOTHERAPY ALERT CARD or IMMUNOTHERAPY ALERT  CARD at check-in to the Emergency Department and triage nurse.  Should you have questions after your visit or need to cancel or reschedule your appointment, please contact [facility] - A DEPT OF [facility]  [phone] and follow the prompts.  Office hours are 8:00 a.m. to 4:30 p.m. Monday - Friday. Please note that voicemails left after 4:00 p.m. may not be returned until the following business day.  We are closed weekends and major holidays. You have access to a nurse at all times for urgent questions. Please call the main number to the clinic [phone] and follow the prompts.  For any non-urgent questions, you may also contact your provider using MyChart. We now offer e-Visits for anyone 82 and older to request care online for non-urgent symptoms. For details visit [email or web address].   Also download the MyChart app! Go to the app store, search "MyChart", open the app, select [facility], and log in with your MyChart username and password.

## 2023-10-16 NOTE — Progress Notes (Signed)
 Gynecologic Oncology Consult Visit   Referring Provider: Dr. 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 female who is seen in consultation from Dr. 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   She is receiving weekly taxol  since 3/25 with Dr Adrian Alba and PET scan yesterday is improved.  See below.   Still has some vaginal bleeding.  Also has some CIPN.   PET scan 10/15/23 FINDINGS: ABDOMEN/PELVIS: Metabolic activity again demonstrated centrally within the uterus with SUV max 11.8 compared SUV max equal 16.5 4A reduction activity.   No radiotracer avid pelvic lymph nodes. No retroperitoneal nodes. No liver metastasis.   Resolution of previously hypermetabolic RIGHT common iliac lymph node.   Incidental CT findings: None.   SKELETON: Metabolic activity associated with a healed RIGHT rib fracture on image 56. favor benign posttraumatic findings. Multiple additional healed rib fractures on the RIGHT. Potential re-injury of this rib   Incidental CT findings: None.   IMPRESSION: 1. Decreased metabolic activity within the endometrium. 2. Resolution RIGHT common iliac lymph node metabolic activity. 3. No hypermetabolic pelvic or abdominal lymph nodes 4. No distant metastatic disease.   Gyn Oncology History She was referred by Dr. Claudius Cumins to Dr. Cleora Daft for reports of postmenopausal bleeding for 6 months, may be more.  Additionally has urinary incontinence.   On exam, friable cervical vs vaginal mass was partially visualized though exam was limited. Biopsy was performed.  Cervical Biopsy: poorly differentiated carcinoma, favor endometrial origin. Positive for p53 and ER. Focal staining for p16, negative for napsin and p63. HER2- 1+ negative. MSI/MMR- Stable Colonoscopy was scheduled but prep  inadequate.    PET/CT EXAM: 10/23 Marked diffuse hypermetabolic activity throughout the entire uterus and cervix, consistent with malignancy. This favors endometrial carcinoma over cervical carcinoma. Borderline enlarged bilateral common iliac lymph nodes noted, but without FDG uptake. No definite evidence of metastatic disease by PET.  Patient seen 01/24/22 and found to have extensive cervical and upper vaginal involvement.  Only having light bleeding.  Decision made to treat her with primary radiation therapy because of extensive involvement of cervix and adjacent vagina, and she was reluctant to consider chemotherapy.  She received primary pelvic external radiation 50 Gy, completed 04/18/22.   05/15/22- CT Chest abdomen Pelvis w contrast IMPRESSION: 1. Central uterine hypoattenuation likely represents residual endometrial primary. 2. Borderline sized common iliac nodes are similar to the prior PET and were not hypermetabolic on that exam. Favored to be reactive. Otherwise, no evidence of metastatic disease in the abdomen or pelvis. 3. Scattered tiny pulmonary nodules are subpleural predominant and favored to represent subpleural lymph nodes. These can be re-evaluated at follow-up.  Treated with Carboplatin /Taxol  and Keytruda  x 3 cycles with excellent clinical response clinically.   CT/PET 4/24 shows response in the uterus. IMPRESSION: 1. Interval decrease in radiotracer uptake associated with the uterus compatible with response to therapy. SUV max is currently equal to 4.22, compared with 21.8 previously. 2. No significant change in size of bilateral common iliac lymph nodes with mild, nonspecific FDG uptake. 3. No new sites of disease identified. 4. Diffusely increased bone marrow activity is new compared with the previous exam and likely reflects treatment related changes.  PAP 01/16/23 AGUS favor cancer  PET scan  01/15/23  - IMPRESSION: Mild residual hypermetabolism in the lower  uterine segment, nonspecific.  Residual/viable tumor is not excluded.   - Small bilateral common iliac nodes, with associated mild hypermetabolism, suggesting small nodal metastases.  - Healing fractures of the right anterior 3rd and 4th ribs, posttraumatic.  02/27/23- Cervix, biopsy  INVOLVEMENT BY PATIENT'S KNOWN ENDOMETRIAL CARCINOMA.  She was felt to not be a good candidate for surgery due to extent of vaginal recurrence and PET positive common iliac nodes in addition to radiation effect in the vagina potentially complicating healing. Additionally, she has significant medical comorbidities and had been falling. We discussed options including doxil  vs clinical trial with TROP2 ADC however she declined trial d/t concerns with transportation.   Foundation testing was sent - 03/06/23- HRDsig Negative, MS stable, TMB 0, PTEN loss exons 4-9, PPP2R1A S256F, TP53 R282W  She was admitted to hospital in late November 2024 for hypoxia secondary to COPD exacerbation. She was treated with steroids and antibiotics.  04/10/23- started Doxil  chemotherapy She received 3 cycles of Doxil .   She began having vaginal bleeding x 1 month and declined evaluation. She suffered a fall 06/2023 while leaning forward from chair and suffered contusions, laceration, and hematoma to face.  Feb 2025- vaginal bleeding/spotting x 1 month  06/14/23- PET  IMPRESSION: 1. Significant and progressive hypermetabolism in the endometrium consistent with recurrent/progressive endometrial cancer. 2. Enlarging right common iliac node with increasing hypermetabolism consistent with metastatic disease. Other small common iliac and external iliac lymph nodes are stable. 3. No findings for other sites of metastatic disease. 4. New scattered tree-in-bud type nodularity suggesting chronic inflammation or atypical infection such as MAC. This is most notable in the right lung.  Treatment Summary:  03/13/22- 04/18/22- pelvic radiation for  extensive vaginal involvement of malignancy 05/15/22- CT C/A/P consistent with residual endometrial primary, borderline common iliac nodes similar to pet and not hypermetabolic. Scattered pulmonary nodules.  06/20/22- Cycle 1 Carbo-Taxol -Keytruda  07/11/22- Cycle 2 Carbo-taxol -keytruda  08/01/22- Cycle 3 Carbo-taxol -keytruda  08/14/22- PET - consistent with response to therapy 08/22/22- Cycle 4 Carbo-taxol -keytruda  09/12/22- Cycle 5 carbo-taxol -keytruda  10/03/22- Cycle 6 carbo-taxol -keytruda  10/12/22- CT C/A/P - unchanged appearance. No worsening disease. Radiation effects.  10/23/22- maintenance keytruda   11/14/22- keytruda  12/05/22- keytruda  12/26/22- keytruda  01/15/23- PET concerning for progressive disease in lower uterine segment and iliac nodes 01/16/23- AGUS favoring cancer Pap 02/06/23- Keytruda  02/27/23- Keytruda ; Foundation One Sent. Second line doxil  discussed. Felt not to be a surgical candidate.  02/27/23- Cervix, biopsy, - endometrial carcinoma 04/10/23- D1C1 - Doxil  50 mg/m2 05/08/23- D1C2 Doxil  50 mg/m2 06/05/23- D1C3 Doxil  50 mg/m2 06/2023- vaginal bleeding  Genetic Testing  Tumor Testing: FoundationOne CDx: 03/06/23- HRDsig Negative, MS stable, TMB 0, PTEN loss exons 4-9, PPP2R1A S256F, TP53 R282W  Testing on 12/28/2021 specimen P53 positive, ER positive  Problem List: Patient Active Problem List   Diagnosis Date Noted   Rash 07/01/2023   Muscle strain of chest wall 07/01/2023   Personal history of fall 04/28/2023   COPD exacerbation (HCC) 03/29/2023   Dyspnea on exertion 02/02/2023   Vitamin D  deficiency 02/02/2023   Vitamin B 12 deficiency 02/02/2023   Need for hepatitis C screening test 02/02/2023   Screening for osteoporosis 02/02/2023   Need for influenza vaccination 02/02/2023   Diabetic eye exam (HCC) 02/02/2023   Aortic atherosclerosis (HCC) 02/02/2023   Bradycardia 01/14/2023   Anemia 09/28/2022   Postmenopausal bleeding 01/30/2022   Endometrial cancer (HCC) 01/25/2022    Mild aortic stenosis 10/12/2020   Essential hypertension 11/05/2017   Closed fracture of left ankle 03/29/2017   Ankle fracture, left 03/26/2017  Acute respiratory failure with hypoxia (HCC) 08/05/2015   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   Hyperlipemia, mixed 04/05/2015   HTN, goal below 140/80 04/05/2015   Obesity (BMI 30-39.9) 04/05/2015   Type 2 diabetes mellitus with complication, with long-term current use of insulin  (HCC) 04/05/2015   Class 2 severe obesity due to excess calories with serious comorbidity in adult [facility]) 08/19/2013   Obesity, Class II, BMI 35-39.9, with comorbidity 08/19/2013   Chronic obstructive pulmonary disease, unspecified (HCC) 07/31/2012   Atherosclerosis of native arteries of extremity with intermittent claudication (HCC) 03/12/2012   Tobacco use 03/12/2012   Hyperlipidemia associated with type 2 diabetes mellitus (HCC) 06/06/2011   Hypertension associated with diabetes (HCC) 06/06/2011   Mood disorder (HCC) 06/06/2011   Type 2 diabetes with complication (HCC) 06/06/2011    Past Medical History: Past Medical History:  Diagnosis Date   Adenomatous colon polyp    Aortic stenosis    Arthritis    Asthma    Cancer (HCC)    Claudication (HCC)    COPD (chronic obstructive pulmonary disease) (HCC)    Depression    Diabetes mellitus without complication (HCC)    Esophageal dysphagia    GERD (gastroesophageal reflux disease)    Hyperlipemia    Hypertension    Obesity    Severe obesity (BMI 35.0-35.9 with comorbidity) (HCC)     Past Surgical History: Past Surgical History:  Procedure Laterality Date   BLADDER SURGERY     COLONOSCOPY WITH PROPOFOL  N/A 08/05/2015   Procedure: COLONOSCOPY WITH PROPOFOL ;  Surgeon: Luella Sager, MD;  Location: [facility] ENDOSCOPY;  Service: Endoscopy;  Laterality: N/A;   ESOPHAGOGASTRODUODENOSCOPY N/A 01/09/2022   Procedure: ESOPHAGOGASTRODUODENOSCOPY (EGD);  Surgeon: Shane Darling, MD;  Location: [facility] ENDOSCOPY;   Service: Endoscopy;  Laterality: N/A;   FOOT SURGERY Left    FRACTURE SURGERY     IR CV LINE INJECTION  06/22/2022   IR IMAGING GUIDED PORT INSERTION  06/05/2022   IR PORT REPAIR CENTRAL VENOUS ACCESS DEVICE  07/02/2022   LOWER EXTREMITY ANGIOGRAPHY Left 02/04/2023   Procedure: Lower Extremity Angiography;  Surgeon: Celso College, MD;  Location: [facility] INVASIVE CV LAB;  Service: Cardiovascular;  Laterality: Left;   ORIF ANKLE FRACTURE Left 03/26/2017   Procedure: OPEN REDUCTION INTERNAL FIXATION (ORIF) ANKLE FRACTURE;  Surgeon: Elner Hahn, MD;  Location: [facility] ORS;  Service: Orthopedics;  Laterality: Left;    OB History:  G17P0 OB History  No obstetric history on file.    Family History: Family History  Problem Relation Age of Onset   CVA Mother    Diabetes Mother    CAD Father    Breast cancer Neg Hx     Social History: Social History   Socioeconomic History   Marital status: Single    Spouse name: Not on file   Number of children: Not on file   Years of education: Not on file   Highest education level: Not on file  Occupational History   Not on file  Tobacco Use   Smoking status: Former    Current packs/day: 1.00    Average packs/day: 1 pack/day for 40.0 years (40.0 ttl pk-yrs)    Types: Cigarettes   Smokeless tobacco: Never   Tobacco comments:    Quite 2015  Vaping Use   Vaping status: Never Used  Substance and Sexual Activity   Alcohol use: No   Drug use: No   Sexual activity: Not on file  Other Topics Concern   Not on  file  Social History Narrative   Lives alone.  Terri [name], here with her today.  Indoor cats.     Social Drivers of Corporate investment banker Strain: Low Risk  (12/20/2022)   Received [facility]   Overall Financial Resource Strain (CARDIA)    Difficulty of Paying Living Expenses: Not very hard  Food Insecurity: No Food Insecurity (12/20/2022)   Received from [facility]   Hunger Vital Sign     Within the past 12 months, you worried that your food would run out before you got the money to buy more.: Never true    Within the past 12 months, the food you bought just didn't last and you didn't have money to get more.: Never true  Transportation Needs: Unknown (12/20/2022)   Received from [facility] - Transportation    In the past 12 months, has lack of transportation kept you from medical appointments or from getting medications?: No    Lack of Transportation (Non-Medical): Not on file  Physical Activity: Inactive (09/20/2022)   Exercise Vital Sign    Days of Exercise per Week: 0 days    Minutes of Exercise per Session: 0 min  Stress: Stress Concern Present (09/20/2022)   [employer] of Occupational Health - Occupational Stress Questionnaire    Feeling of Stress : To some extent  Social Connections: Socially Isolated (09/20/2022)   Social Connection and Isolation Panel    Frequency of Communication with Friends and Family: Once a week    Frequency of Social Gatherings with Friends and Family: Never    Attends Religious Services: Never    Database administrator or Organizations: No    Attends Banker Meetings: Never    Marital Status: Never married  Intimate Partner Violence: Not At Risk (05/30/2022)   Humiliation, Afraid, Rape, and Kick questionnaire    Fear of Current or Ex-Partner: No    Emotionally Abused: No    Physically Abused: No    Sexually Abused: No    Allergies: No Known Allergies  Current Medications: Current Outpatient Medications  Medication Sig Dispense Refill   albuterol  (VENTOLIN  HFA) 108 (90 Base) MCG/ACT inhaler Inhale 2 puffs into the lungs every 6 (six) hours as needed for wheezing or shortness of breath. 1 each 1   amLODipine  (NORVASC ) 10 MG tablet Take 10 mg by mouth daily.     aspirin  EC 81 MG tablet Take 1 tablet (81 mg total) by mouth daily. Swallow whole. 150 tablet 2   atorvastatin  (LIPITOR) 80 MG  tablet Take 80 mg by mouth daily.     buPROPion  (WELLBUTRIN  XL) 300 MG 24 hr tablet TAKE 1 TABLET BY MOUTH DAILY 90 tablet 3   Cholecalciferol  (VITAMIN D3) 50 MCG (2000 UT) capsule Take 2,000 Units by mouth daily.     citalopram  (CELEXA ) 20 MG tablet Take 20 mg by mouth daily.     cloNIDine  (CATAPRES ) 0.2 MG tablet Take 0.2 mg by mouth 2 (two) times daily.     clopidogrel  (PLAVIX ) 75 MG tablet Take 1 tablet (75 mg total) by mouth daily. 90 tablet 3   cyanocobalamin  (VITAMIN B12) 1000 MCG tablet Take 1 tablet (1,000 mcg total) by mouth daily. 90 tablet 3   fluticasone -salmeterol (WIXELA INHUB) 250-50 MCG/ACT AEPB Inhale 1 puff into the lungs in the morning and at bedtime. 180 each 6   insulin  aspart (NOVOLOG ) 100 UNIT/ML injection Inject 5 Units into  the skin 3 (three) times daily before meals.     insulin  degludec (TRESIBA FLEXTOUCH) 100 UNIT/ML FlexTouch Pen Inject 20 Units into the skin daily. 20 units once daily - REPLACES Levemir      Iron , Ferrous Sulfate , 325 (65 Fe) MG TABS Take 325 mg by mouth every other day. 90 tablet 3   losartan -hydrochlorothiazide  (HYZAAR) 100-25 MG tablet Take 1 tablet by mouth daily.     metoprolol  succinate (TOPROL -XL) 50 MG 24 hr tablet Take 1 tablet by mouth daily.     montelukast  (SINGULAIR ) 10 MG tablet Take 10 mg by mouth at bedtime.     ondansetron  (ZOFRAN ) 8 MG tablet Take 1 tablet (8 mg total) by mouth every 8 (eight) hours as needed for nausea or vomiting. 30 tablet 0   ONETOUCH VERIO test strip 1 each by Other route 3 (three) times daily. 100 each 10   Semaglutide ,0.25 or 0.5MG /DOS, 2 MG/3ML SOPN Inject 0.5 mg into the skin once a week. 3 mL 3   Tiotropium Bromide Monohydrate  (SPIRIVA  RESPIMAT) 2.5 MCG/ACT AERS Inhale 2 puffs into the lungs daily. 60 each 11   megestrol  (MEGACE ) 40 MG tablet Take 1 tablet (40 mg total) by mouth daily. 30 tablet 2   No current facility-administered medications for this visit.    Review of Systems General:   fatigue Skin: no complaints Eyes: no complaints HEENT: no complaints Breasts: no complaints Pulmonary: no complaints Cardiac: no complaints Gastrointestinal: decreased appetite and constipation; no complaints Ob/Gyn: vaginal bleeding o/w no complaints Musculoskeletal: no complaints Hematology: no complaints Neurologic/Psych: depression   Objective:  Physical Examination:  BP (!) 160/66 (BP Location: Left Arm, Patient Position: Sitting)   Pulse 67   Temp (!) 96.8 F (36 C) (Tympanic)   Resp 16   Wt 157 lb 8 oz (71.4 kg)   SpO2 96%   BMI 28.81 kg/m     ECOG Performance Status: 2  GENERAL: Patient is a elderly appearing female in no acute distress HEENT:  Sclera clear. Anicteric NODES:  Negative axillary, supraclavicular, inguinal lymph node survery LUNGS:  Normal respiratory rate ABDOMEN:  Soft, nontender.  No hernias, incisions well healed. No masses or ascites EXTREMITIES:  No peripheral edema. Atraumatic. No cyanosis SKIN:  abdominal confluent erythematous rash with desquamation covering the anterior abdomen, thighs and hips NEURO:  Nonfocal. Well oriented.  Appropriate affect.  Pelvic exam: Chaperoned by CMA deferred EGBUS: no lesions Cervix/Vagina: narrow with no obvious cancer, some watery blood tinged fluid.   BME: normal    Lab Review Lab Results  Component Value Date   WBC 4.9 10/16/2023   HGB 9.3 (L) 10/16/2023   HCT 29.8 (L) 10/16/2023   MCV 91.1 10/16/2023   PLT 163 10/16/2023     Chemistry      Component Value Date/Time   NA 142 10/16/2023 0827   K 3.5 10/16/2023 0827   CL 106 10/16/2023 0827   CO2 29 10/16/2023 0827   BUN 20 10/16/2023 0827   CREATININE 0.86 10/16/2023 0827      Component Value Date/Time   CALCIUM  8.5 (L) 10/16/2023 0827   ALKPHOS 77 10/16/2023 0827   AST 15 10/16/2023 0827   ALT 10 10/16/2023 0827   BILITOT 0.9 10/16/2023 0827        Assessment:  Terri [name] is a 73 y.o. 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  PET scan shows involvement of entire uterus and  cervix.  No distant disease. Bilateral common iliac nodes are borderline enlarged, but not PET positive.   Poorly differentiated cancer with TP53 overexpression and negative HPV test so unlikely to be cervical primary. She was hesitant for chemotherapy and elected for radiation in view of her locally advanced disease.  Completed radiation 04/18/22 and on exam still had fleshy cancer involving upper vagina and flush cervix.  CT scan continued to show hypoattenuation of uterus cw persistent disease, but no evidence of distant disease.  Discussed that she was not a surgical candidate due to extensive upper vaginal involvement with cancer and decision made to start systemic therapy.  She received three cycles of carboplatin /taxol /keytruda  and PET/CT 4/24 showed considerable decrease SUV in the uterus and no distant disease.  On exam 08/15/22 the vaginal disease had regressed dramatically. Completed 6 cycles of carboplain/taxol /keytruda  10/04/22.  CT scan showed no evidence of residual disease. Continued on maintenance Keytruda .    Some vaginal spotting 9/24, but no obvious evidence of disease on exam.  PET scan showed mild activity in lower uterine segment and two high common iliac nodes 7-8 mm. PAP AGUS concerning for cancer. Doxil  x 3 cycles with progressive disease. Weekly taxol  for the past 3 months and PET scan this week shows decreased metabolic activity within the endometrium and resolution RIGHT common iliac lymph node metabolic activity.  She has some light vaginal bleeding, but no gross tumor.  Bleeding likely coming from the endometrium, as there is no active tumor in the vagina presently.    Cancer is TP53 mutated, MSS and HER2 negative. ER positive  Medical co-morbidities complicating care: obesity, COPD, aortic stenosis, PVD.  Plan:   Problem List  Items Addressed This Visit       Genitourinary   Endometrial cancer (HCC) - Primary   We discussed treatment options and she would like to continue treatment with weekly taxol  with Dr. Adrian Alba for three cycles since she is responding.  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  On recent PET the only hypermetabolic area is in the uterus, so may be worth considering brachytherapy to the uterus.  However, she does not want to travel to [facility] for treatment.  Lives alone and uses [facility].   Her ER status is positive and we will request further evaluation regarding extend of positivity and request PR status. Dependent on her hormone status she may be a candidate for alternating Megace  and tamoxifen in the future.  She will follow-up with Dr. Adrian Alba today for continued weekly taxol  treatment.  RTC in 3 months or sooner based on her symptoms.   Hermine Loots, MD    CC:  Valli Gaw, MD [address] [place],  [place] [address] [phone]

## 2023-10-16 NOTE — Progress Notes (Signed)
 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  INTERVAL HISTORY: Patient returns to clinic today for further evaluation, discussion of her PET scan results, and consideration of cycle 4, day 1 of single agent Taxol .  She continues to have a chronic peripheral neuropathy and occasional falls, but otherwise feels well.  She does not complain of weakness or fatigue today.  Her vaginal bleeding has improved, but still evident.  She has no other neurologic complaints.  She denies any recent fevers or illnesses.  She has no chest pain, shortness of breath, cough, or hemoptysis.  She denies any nausea, vomiting, constipation, or diarrhea.  She has no urinary complaints.  Patient offers no further specific complaints today.  REVIEW OF SYSTEMS:   Review of Systems  Constitutional: Negative.  Negative for fever, malaise/fatigue and weight loss.  Respiratory: Negative.  Negative for cough, hemoptysis and shortness of breath.   Cardiovascular: Negative.  Negative for chest pain and leg swelling.  Gastrointestinal: Negative.  Negative for abdominal pain, blood in stool and melena.  Genitourinary: Negative.  Negative for dysuria.  Musculoskeletal: Negative.  Negative for back pain and falls.  Skin: Negative.  Negative for rash.  Neurological:  Positive for dizziness, sensory change and weakness. Negative for focal weakness and headaches.  Psychiatric/Behavioral: Negative.  The patient is  not nervous/anxious.     As per HPI. Otherwise, a complete review of systems is negative.  PAST MEDICAL HISTORY: Past Medical History:  Diagnosis Date   Adenomatous colon polyp    Aortic stenosis    Arthritis    Asthma    Cancer (HCC)    Claudication (HCC)    COPD (chronic obstructive pulmonary disease) (HCC)    Depression    Diabetes mellitus without complication (HCC)    Esophageal dysphagia    GERD (gastroesophageal reflux disease)    Hyperlipemia    Hypertension    Obesity    Severe obesity (BMI 35.0-35.9 with comorbidity) (HCC)     PAST SURGICAL HISTORY: Past Surgical History:  Procedure Laterality Date   BLADDER SURGERY     COLONOSCOPY WITH PROPOFOL  N/A 08/05/2015   Procedure: COLONOSCOPY WITH PROPOFOL ;  Surgeon: Luella Sager, MD;  Location[facility] ENDOSCOPY;  Service: Endoscopy;  Laterality: N/A;   ESOPHAGOGASTRODUODENOSCOPY N/A 01/09/2022   Procedure: ESOPHAGOGASTRODUODENOSCOPY (EGD);  Surgeon: Shane Darling, MD;  Location: [facility] ENDOSCOPY;  Service: Endoscopy;  Laterality: N/A;   FOOT SURGERY Left    FRACTURE SURGERY     IR CV LINE INJECTION  06/22/2022   IR IMAGING GUIDED PORT INSERTION  06/05/2022   IR PORT REPAIR CENTRAL VENOUS ACCESS DEVICE  07/02/2022   LOWER EXTREMITY ANGIOGRAPHY Left 02/04/2023   Procedure: Lower Extremity Angiography;  Surgeon: Celso College, MD;  Location: [facility] INVASIVE CV LAB;  Service: Cardiovascular;  Laterality: Left;   ORIF ANKLE FRACTURE Left 03/26/2017   Procedure: OPEN REDUCTION INTERNAL FIXATION (ORIF) ANKLE FRACTURE;  Surgeon: Elner Hahn, MD;  Location: [facility] ORS;  Service: Orthopedics;  Laterality: Left;    FAMILY HISTORY: Family History  Problem Relation Age of Onset  CVA Mother    Diabetes Mother    CAD Father    Breast cancer Neg Hx     ADVANCED DIRECTIVES (Y/N):  N  HEALTH MAINTENANCE: Social History   Tobacco Use   Smoking status: Former    Current packs/day: 1.00    Average packs/day: 1 pack/day for  40.0 years (40.0 ttl pk-yrs)    Types: Cigarettes   Smokeless tobacco: Never   Tobacco comments:    Quite 2015  Vaping Use   Vaping status: Never Used  Substance Use Topics   Alcohol use: No   Drug use: No     Colonoscopy:  PAP:  Bone density:  Lipid panel:  No Known Allergies  Current Outpatient Medications  Medication Sig Dispense Refill   albuterol  (VENTOLIN  HFA) 108 (90 Base) MCG/ACT inhaler Inhale 2 puffs into the lungs every 6 (six) hours as needed for wheezing or shortness of breath. 1 each 1   amLODipine  (NORVASC ) 10 MG tablet Take 10 mg by mouth daily.     aspirin  EC 81 MG tablet Take 1 tablet (81 mg total) by mouth daily. Swallow whole. 150 tablet 2   atorvastatin  (LIPITOR) 80 MG tablet Take 80 mg by mouth daily.     buPROPion  (WELLBUTRIN  XL) 300 MG 24 hr tablet TAKE 1 TABLET BY MOUTH DAILY 90 tablet 3   Cholecalciferol  (VITAMIN D3) 50 MCG (2000 UT) capsule Take 2,000 Units by mouth daily.     citalopram  (CELEXA ) 20 MG tablet Take 20 mg by mouth daily.     cloNIDine  (CATAPRES ) 0.2 MG tablet Take 0.2 mg by mouth 2 (two) times daily.     clopidogrel  (PLAVIX ) 75 MG tablet Take 1 tablet (75 mg total) by mouth daily. 90 tablet 3   cyanocobalamin  (VITAMIN B12) 1000 MCG tablet Take 1 tablet (1,000 mcg total) by mouth daily. 90 tablet 3   fluticasone -salmeterol (WIXELA INHUB) 250-50 MCG/ACT AEPB Inhale 1 puff into the lungs in the morning and at bedtime. 180 each 6   insulin  aspart (NOVOLOG ) 100 UNIT/ML injection Inject 5 Units into the skin 3 (three) times daily before meals.     insulin  degludec (TRESIBA FLEXTOUCH) 100 UNIT/ML FlexTouch Pen Inject 20 Units into the skin daily. 20 units once daily - REPLACES Levemir      Iron , Ferrous Sulfate , 325 (65 Fe) MG TABS Take 325 mg by mouth every other day. 90 tablet 3   losartan -hydrochlorothiazide  (HYZAAR) 100-25 MG tablet Take 1 tablet by mouth daily.     megestrol  (MEGACE ) 40 MG tablet Take 1 tablet (40 mg total) by mouth daily. 30  tablet 2   metoprolol  succinate (TOPROL -XL) 50 MG 24 hr tablet Take 1 tablet by mouth daily.     montelukast  (SINGULAIR ) 10 MG tablet Take 10 mg by mouth at bedtime.     ondansetron  (ZOFRAN ) 8 MG tablet Take 1 tablet (8 mg total) by mouth every 8 (eight) hours as needed for nausea or vomiting. 30 tablet 0   ONETOUCH VERIO test strip 1 each by Other route 3 (three) times daily. 100 each 10   Semaglutide ,0.25 or 0.5MG /DOS, 2 MG/3ML SOPN Inject 0.5 mg into the skin once a week. 3 mL 3   Tiotropium Bromide Monohydrate  (SPIRIVA  RESPIMAT) 2.5 MCG/ACT AERS Inhale 2 puffs into the lungs daily. 60 each 11   No current facility-administered medications for this visit.    OBJECTIVE: There were no vitals filed for this visit.      There is no height or weight  on file to calculate BMI.    ECOG FS:1 - Symptomatic but completely ambulatory  General: Well-developed, well-nourished, no acute distress. Eyes: Pink conjunctiva, anicteric sclera. HEENT: Normocephalic, moist mucous membranes. Lungs: No audible wheezing or coughing. Heart: Regular rate and rhythm. Abdomen: Soft, nontender, no obvious distention. Musculoskeletal: No edema, cyanosis, or clubbing. Neuro: Alert, answering all questions appropriately. Cranial nerves grossly intact. Skin: No rashes or petechiae noted. Psych: Normal affect.  LAB RESULTS:  Lab Results  Component Value Date   NA 142 10/16/2023   K 3.5 10/16/2023   CL 106 10/16/2023   CO2 29 10/16/2023   GLUCOSE 159 (H) 10/16/2023   BUN 20 10/16/2023   CREATININE 0.86 10/16/2023   CALCIUM  8.5 (L) 10/16/2023   PROT 5.6 (L) 10/16/2023   ALBUMIN 3.3 (L) 10/16/2023   AST 15 10/16/2023   ALT 10 10/16/2023   ALKPHOS 77 10/16/2023   BILITOT 0.9 10/16/2023   GFRNONAA >60 10/16/2023   GFRAA >60 03/27/2017    Lab Results  Component Value Date   WBC 4.9 10/16/2023   NEUTROABS 4.0 10/16/2023   HGB 9.3 (L) 10/16/2023   HCT 29.8 (L) 10/16/2023   MCV 91.1 10/16/2023   PLT  163 10/16/2023     STUDIES: NM PET Image Restag (PS) Skull Base To Thigh Result Date: 10/15/2023 CLINICAL DATA:  Subsequent treatment strategy for endometrial carcinoma. EXAM: NUCLEAR MEDICINE PET SKULL BASE TO THIGH TECHNIQUE: 9.5 mCi F-18 FDG was injected intravenously. Full-ring PET imaging was performed from the skull base to thigh after the radiotracer. CT data was obtained and used for attenuation correction and anatomic localization. Fasting blood glucose: 111 mg/dl COMPARISON:  FDG PET scan 06/14/2023 FINDINGS: Choose one NECK: No hypermetabolic lymph nodes in the neck. Incidental CT findings: None. CHEST: No hypermetabolic mediastinal or hilar nodes. No suspicious pulmonary nodules on the CT scan. Incidental CT findings: Port in the anterior chest wall with tip in distal SVC. ABDOMEN/PELVIS: Metabolic activity again demonstrated centrally within the uterus with SUV max 11.8 compared SUV max equal 16.5 4A reduction activity. No radiotracer avid pelvic lymph nodes. No retroperitoneal nodes. No liver metastasis. Resolution of previously hypermetabolic RIGHT common iliac lymph node. Incidental CT findings: None. SKELETON: Metabolic activity associated with a healed RIGHT rib fracture on image 56. favor benign posttraumatic findings. Multiple additional healed rib fractures on the RIGHT. Potential re-injury of this rib Incidental CT findings: None. IMPRESSION: 1. Decreased metabolic activity within the endometrium. 2. Resolution RIGHT common iliac lymph node metabolic activity. 3. No hypermetabolic pelvic or abdominal lymph nodes 4. No distant metastatic disease. Electronically Signed   By: Deboraha Fallow M.D.   On: 10/15/2023 14:11      ONCOLOGY HISTORY: Patient was initially hesitant to undergo chemotherapy and elected to do XRT only which was completed on April 18, 2022.  CT scan results from October 08, 2022 reviewed independently with no obvious evidence of progressive disease. She completed 6  cycles of carboplatinum, Taxol , and Keytruda  on October 03, 2022.  Patient then initiated maintenance Keytruda  on October 23, 2022.  PET scan results from January 15, 2023 reviewed independently with mild nonspecific residual hypermetabolism in the lower uterine segment as well as small bilateral common iliac nodes possibly suggesting nodal metastasis.  Patient was seen by gynecology oncology who determined surgical intervention is not an option.  They recommend discontinuing Keytruda  for progressive disease and initiating single agent Doxil  every 28 days.  Patient received 3 cycles of Doxil  before progression of disease her  last dose was given on June 05, 2023.  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  Repeat PET scan on October 15, 2023 reviewed independently with significant improvement of disease burden.  Internal exam by gynecology oncology today also confirmed disease improvement.  Proceed with cycle 4, day 1 of single agent Taxol  today.  Will reimage after the conclusion of cycle 6.  If residual disease at that time, can consider internal brachytherapy at [facility].   Port: Port revision successful.  Proceed with treatment as above. Hyperglycemia: Chronic and unchanged, monitor. Leukopenia: Resolved. Anemia: Hemoglobin improved to 9.3.  Monitor.   Vaginal bleeding: Improved.  Continue follow-up with gynecology oncology as needed.   Claudication symptoms: Resolved.  Patient underwent revascularization procedure on February 04, 2023. Right hamstring tendon rupture: Patient reports there is no plan for surgical repair. Rash: Resolved.  Likely secondary to Doxil  which has been discontinued.   Poor appetite/weight loss: Continue Megace  as prescribed.  Patient refuses referral to dietary.   Neuropathy/dizziness: Continue follow-up with neurology as  recommended.   Falls: Intermittent.  Patient now has 2 walkers at her home upstairs and downstairs.  She admits to only using them occasionally.  I spent a total of 30 minutes reviewing chart data, face-to-face evaluation with the patient, counseling and coordination of care as detailed above.  Patient expressed understanding and was in agreement with this plan. She also understands that She can call clinic at any time with any questions, concerns, or complaints.    Cancer Staging  Endometrial cancer [facility]) Staging form: Corpus Uteri - Carcinoma and Carcinosarcoma, AJCC 8th Edition - Clinical stage from 05/30/2022: FIGO Stage IIIB (cT3b, cN0, cM0) - Signed by Shellie Dials, MD on 05/30/2022 Stage prefix: Initial diagnosis   Shellie Dials, MD   10/16/2023 10:04 AM

## 2023-10-17 ENCOUNTER — Encounter: Payer: Self-pay | Admitting: Family Medicine

## 2023-10-18 ENCOUNTER — Encounter: Payer: Self-pay | Admitting: Oncology

## 2023-10-18 ENCOUNTER — Other Ambulatory Visit: Payer: Self-pay | Admitting: Family Medicine

## 2023-10-18 ENCOUNTER — Encounter: Payer: Self-pay | Admitting: Pharmacist

## 2023-10-18 NOTE — Progress Notes (Signed)
 Brief Telephone Documentation Reason for Call: Patient left message on pharmacist voicemail: Would like to stop Ozempic . Has lost 5-6 pounds with her cancer treatments and would like to do what she can o prevent further weight loss.  Summary of Call: Returned patient's call. No answer, left voicemail with direct callback number.  PCP aware of medication change. Has removed Ozempic  from med list.   Patient given direct line to return my call. Will attempt outreach again Monday if no response at that time.  Phone follow up scheduled ~2 weeks to review BG a couple weeks off of Ozempic .   Follow Up: Patient given direct line for further questions/concerns.  Daron Ellen, PharmD Clinical Pharmacist [facility] [phone]

## 2023-10-21 ENCOUNTER — Other Ambulatory Visit: Payer: Self-pay

## 2023-10-21 MED ORDER — AMLODIPINE BESYLATE 10 MG PO TABS: 10.0000 mg | ORAL_TABLET | Freq: Every day | ORAL | 0 refills | Status: DC

## 2023-10-21 MED ORDER — BUPROPION HCL ER (XL) 300 MG PO TB24: 300.0000 mg | ORAL_TABLET | Freq: Every day | ORAL | 0 refills | Status: DC

## 2023-10-21 MED ORDER — METOPROLOL SUCCINATE ER 50 MG PO TB24: 50.0000 mg | ORAL_TABLET | Freq: Every day | ORAL | 0 refills | Status: DC

## 2023-10-22 ENCOUNTER — Encounter: Payer: Self-pay | Admitting: Oncology

## 2023-10-23 ENCOUNTER — Inpatient Hospital Stay

## 2023-10-23 ENCOUNTER — Encounter: Payer: Self-pay | Admitting: Oncology

## 2023-10-23 ENCOUNTER — Inpatient Hospital Stay (HOSPITAL_BASED_OUTPATIENT_CLINIC_OR_DEPARTMENT_OTHER): Admitting: Oncology

## 2023-10-23 VITALS — BP 108/60 | HR 75 | Temp 97.0°F | Resp 16 | Ht 62.0 in | Wt 155.0 lb

## 2023-10-23 DIAGNOSIS — C541 Malignant neoplasm of endometrium: Secondary | ICD-10-CM

## 2023-10-23 DIAGNOSIS — Z5111 Encounter for antineoplastic chemotherapy: Secondary | ICD-10-CM | POA: Diagnosis not present

## 2023-10-23 LAB — CMP (CANCER CENTER ONLY)
ALT: 12 U/L (ref 0–44)
AST: 13 U/L — ABNORMAL LOW (ref 15–41)
Albumin: 3.1 g/dL — ABNORMAL LOW (ref 3.5–5.0)
Alkaline Phosphatase: 65 U/L (ref 38–126)
Anion gap: 7 (ref 5–15)
BUN: 25 mg/dL — ABNORMAL HIGH (ref 8–23)
CO2: 27 mmol/L (ref 22–32)
Calcium: 8.6 mg/dL — ABNORMAL LOW (ref 8.9–10.3)
Chloride: 105 mmol/L (ref 98–111)
Creatinine: 1.27 mg/dL — ABNORMAL HIGH (ref 0.44–1.00)
GFR, Estimated: 45 mL/min — ABNORMAL LOW (ref >60)
Glucose, Bld: 163 mg/dL — ABNORMAL HIGH (ref 70–99)
Potassium: 3.2 mmol/L — ABNORMAL LOW (ref 3.5–5.1)
Sodium: 139 mmol/L (ref 135–145)
Total Bilirubin: 0.7 mg/dL (ref 0.0–1.2)
Total Protein: 5.2 g/dL — ABNORMAL LOW (ref 6.5–8.1)

## 2023-10-23 LAB — CBC WITH DIFFERENTIAL (CANCER CENTER ONLY)
Abs Immature Granulocytes: 0.02 10*3/uL (ref 0.00–0.07)
Basophils Absolute: 0 10*3/uL (ref 0.0–0.1)
Basophils Relative: 1 %
Eosinophils Absolute: 0.2 10*3/uL (ref 0.0–0.5)
Eosinophils Relative: 5 %
HCT: 27.7 % — ABNORMAL LOW (ref 36.0–46.0)
Hemoglobin: 8.8 g/dL — ABNORMAL LOW (ref 12.0–15.0)
Immature Granulocytes: 1 %
Lymphocytes Relative: 8 %
Lymphs Abs: 0.3 10*3/uL — ABNORMAL LOW (ref 0.7–4.0)
MCH: 28.6 pg (ref 26.0–34.0)
MCHC: 31.8 g/dL (ref 30.0–36.0)
MCV: 89.9 fL (ref 80.0–100.0)
Monocytes Absolute: 0.2 10*3/uL (ref 0.1–1.0)
Monocytes Relative: 6 %
Neutro Abs: 3.1 10*3/uL (ref 1.7–7.7)
Neutrophils Relative %: 79 %
Platelet Count: 158 10*3/uL (ref 150–400)
RBC: 3.08 MIL/uL — ABNORMAL LOW (ref 3.87–5.11)
RDW: 15.7 % — ABNORMAL HIGH (ref 11.5–15.5)
WBC Count: 3.8 10*3/uL — ABNORMAL LOW (ref 4.0–10.5)
nRBC: 0 % (ref 0.0–0.2)

## 2023-10-23 LAB — SAMPLE TO BLOOD BANK

## 2023-10-23 MED ORDER — DIPHENHYDRAMINE HCL 50 MG/ML IJ SOLN
25.0000 mg | Freq: Once | INTRAMUSCULAR | Status: AC
  Administered 2023-10-23: 25 mg via INTRAVENOUS
  Filled 2023-10-23: qty 1

## 2023-10-23 MED ORDER — HEPARIN SOD (PORK) LOCK FLUSH 100 UNIT/ML IV SOLN
500.0000 [IU] | Freq: Once | INTRAVENOUS | Status: AC | PRN
  Administered 2023-10-23: 500 [IU]
  Filled 2023-10-23: qty 5

## 2023-10-23 MED ORDER — SODIUM CHLORIDE 0.9 % IV SOLN
80.0000 mg/m2 | Freq: Once | INTRAVENOUS | Status: AC
  Administered 2023-10-23: 150 mg via INTRAVENOUS
  Filled 2023-10-23: qty 25

## 2023-10-23 MED ORDER — SODIUM CHLORIDE 0.9 % IV SOLN
INTRAVENOUS | Status: DC
  Filled 2023-10-23: qty 250

## 2023-10-23 MED ORDER — DEXAMETHASONE SODIUM PHOSPHATE 10 MG/ML IJ SOLN
10.0000 mg | Freq: Once | INTRAMUSCULAR | Status: AC
  Administered 2023-10-23: 10 mg via INTRAVENOUS
  Filled 2023-10-23: qty 1

## 2023-10-23 MED ORDER — FAMOTIDINE IN NACL 20-0.9 MG/50ML-% IV SOLN
20.0000 mg | Freq: Once | INTRAVENOUS | Status: AC
  Administered 2023-10-23: 20 mg via INTRAVENOUS
  Filled 2023-10-23: qty 50

## 2023-10-23 NOTE — Patient Instructions (Signed)
 [facility] - A DEPT OF [facility]  Discharge Instructions: Thank you for choosing [facility] to provide your oncology and hematology care.  If you have a lab appointment with the Cancer Center, please go directly to the Cancer Center and check in at the registration area.  Wear comfortable clothing and clothing appropriate for easy access to any Portacath or PICC line.   We strive to give you quality time with your provider. You may need to reschedule your appointment if you arrive late (15 or more minutes).  Arriving late affects you and other patients whose appointments are after yours.  Also, if you miss three or more appointments without notifying the office, you may be dismissed from the clinic at the provider's discretion.      For prescription refill requests, have your pharmacy contact our office and allow 72 hours for refills to be completed.    Today you received the following chemotherapy and/or immunotherapy agents Taxol.      To help prevent nausea and vomiting after your treatment, we encourage you to take your nausea medication as directed.  BELOW ARE SYMPTOMS THAT SHOULD BE REPORTED IMMEDIATELY: *FEVER GREATER THAN 100.4 F (38 C) OR HIGHER *CHILLS OR SWEATING *NAUSEA AND VOMITING THAT IS NOT CONTROLLED WITH YOUR NAUSEA MEDICATION *UNUSUAL SHORTNESS OF BREATH *UNUSUAL BRUISING OR BLEEDING *URINARY PROBLEMS (pain or burning when urinating, or frequent urination) *BOWEL PROBLEMS (unusual diarrhea, constipation, pain near the anus) TENDERNESS IN MOUTH AND THROAT WITH OR WITHOUT PRESENCE OF ULCERS (sore throat, sores in mouth, or a toothache) UNUSUAL RASH, SWELLING OR PAIN  UNUSUAL VAGINAL DISCHARGE OR ITCHING   Items with * indicate a potential emergency and should be followed up as soon as possible or go to the Emergency Department if any problems should occur.  Please show the CHEMOTHERAPY ALERT CARD or IMMUNOTHERAPY ALERT  CARD at check-in to the Emergency Department and triage nurse.  Should you have questions after your visit or need to cancel or reschedule your appointment, please contact [facility] - A DEPT OF [facility]  [phone] and follow the prompts.  Office hours are 8:00 a.m. to 4:30 p.m. Monday - Friday. Please note that voicemails left after 4:00 p.m. may not be returned until the following business day.  We are closed weekends and major holidays. You have access to a nurse at all times for urgent questions. Please call the main number to the clinic [phone] and follow the prompts.  For any non-urgent questions, you may also contact your provider using MyChart. We now offer e-Visits for anyone 82 and older to request care online for non-urgent symptoms. For details visit [email or web address].   Also download the MyChart app! Go to the app store, search "MyChart", open the app, select [facility], and log in with your MyChart username and password.

## 2023-10-23 NOTE — Progress Notes (Signed)
 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  INTERVAL HISTORY: Patient returns to clinic today for further evaluation and consideration of cycle 4, day 8 of single agent Taxol .  She denies any falls this week.  She continues to have a chronic peripheral neuropathy.  She does not complain of any weakness or fatigue. Her vaginal bleeding has improved, but still evident.  She has no other neurologic complaints.  She denies any recent fevers or illnesses.  She has no chest pain, shortness of breath, cough, or hemoptysis.  She denies any nausea, vomiting, constipation, or diarrhea.  She has no urinary complaints.  Patient offers no further specific complaints today.  REVIEW OF SYSTEMS:   Review of Systems  Constitutional: Negative.  Negative for fever, malaise/fatigue and weight loss.  Respiratory: Negative.  Negative for cough, hemoptysis and shortness of breath.   Cardiovascular: Negative.  Negative for chest pain and leg swelling.  Gastrointestinal: Negative.  Negative for abdominal pain, blood in stool and melena.  Genitourinary: Negative.  Negative for dysuria.  Musculoskeletal: Negative.  Negative for back pain and falls.  Skin: Negative.  Negative for rash.  Neurological:  Positive for dizziness, sensory change and weakness. Negative for focal weakness and headaches.  Psychiatric/Behavioral: Negative.  The patient is not nervous/anxious.     As per HPI. Otherwise, a  complete review of systems is negative.  PAST MEDICAL HISTORY: Past Medical History:  Diagnosis Date   Adenomatous colon polyp    Aortic stenosis    Arthritis    Asthma    Cancer (HCC)    Claudication (HCC)    COPD (chronic obstructive pulmonary disease) (HCC)    Depression    Diabetes mellitus without complication (HCC)    Esophageal dysphagia    GERD (gastroesophageal reflux disease)    Hyperlipemia    Hypertension    Obesity    Severe obesity (BMI 35.0-35.9 with comorbidity) (HCC)     PAST SURGICAL HISTORY: Past Surgical History:  Procedure Laterality Date   BLADDER SURGERY     COLONOSCOPY WITH PROPOFOL  N/A 08/05/2015   Procedure: COLONOSCOPY WITH PROPOFOL ;  Surgeon: Donnice Vaughn Manes, MD;  Location[facility] ENDOSCOPY;  Service: Endoscopy;  Laterality: N/A;   ESOPHAGOGASTRODUODENOSCOPY N/A 01/09/2022   Procedure: ESOPHAGOGASTRODUODENOSCOPY (EGD);  Surgeon: Maryruth Ole DASEN, MD;  Location: [facility] ENDOSCOPY;  Service: Endoscopy;  Laterality: N/A;   FOOT SURGERY Left    FRACTURE SURGERY     IR CV LINE INJECTION  06/22/2022   IR IMAGING GUIDED PORT INSERTION  06/05/2022   IR PORT REPAIR CENTRAL VENOUS ACCESS DEVICE  07/02/2022   LOWER EXTREMITY ANGIOGRAPHY Left 02/04/2023   Procedure: Lower Extremity Angiography;  Surgeon: Marea Selinda RAMAN, MD;  Location: [facility] INVASIVE CV LAB;  Service: Cardiovascular;  Laterality: Left;   ORIF ANKLE FRACTURE Left 03/26/2017   Procedure: OPEN REDUCTION INTERNAL FIXATION (ORIF) ANKLE FRACTURE;  Surgeon: Edie Norleen PARAS, MD;  Location: [facility] ORS;  Service: Orthopedics;  Laterality: Left;    FAMILY HISTORY: Family History  Problem Relation Age of Onset   CVA Mother  Diabetes Mother    CAD Father    Breast cancer Neg Hx     ADVANCED DIRECTIVES (Y/N):  N  HEALTH MAINTENANCE: Social History   Tobacco Use   Smoking status: Former    Current packs/day: 1.00    Average packs/day: 1 pack/day for 40.0 years (40.0 ttl pk-yrs)    Types: Cigarettes    Smokeless tobacco: Never   Tobacco comments:    Quite 2015  Vaping Use   Vaping status: Never Used  Substance Use Topics   Alcohol use: No   Drug use: No     Colonoscopy:  PAP:  Bone density:  Lipid panel:  No Known Allergies  Current Outpatient Medications  Medication Sig Dispense Refill   albuterol  (VENTOLIN  HFA) 108 (90 Base) MCG/ACT inhaler Inhale 2 puffs into the lungs every 6 (six) hours as needed for wheezing or shortness of breath. 1 each 1   amLODipine  (NORVASC ) 10 MG tablet Take 1 tablet (10 mg total) by mouth daily. 30 tablet 0   aspirin  EC 81 MG tablet Take 1 tablet (81 mg total) by mouth daily. Swallow whole. 150 tablet 2   atorvastatin  (LIPITOR) 80 MG tablet Take 80 mg by mouth daily.     buPROPion  (WELLBUTRIN  XL) 300 MG 24 hr tablet Take 1 tablet (300 mg total) by mouth daily. 30 tablet 0   Cholecalciferol  (VITAMIN D3) 50 MCG (2000 UT) capsule Take 2,000 Units by mouth daily.     citalopram  (CELEXA ) 20 MG tablet Take 20 mg by mouth daily.     cloNIDine  (CATAPRES ) 0.2 MG tablet Take 0.2 mg by mouth 2 (two) times daily.     clopidogrel  (PLAVIX ) 75 MG tablet Take 1 tablet (75 mg total) by mouth daily. 90 tablet 3   cyanocobalamin  (VITAMIN B12) 1000 MCG tablet Take 1 tablet (1,000 mcg total) by mouth daily. 90 tablet 3   fluticasone -salmeterol (WIXELA INHUB) 250-50 MCG/ACT AEPB Inhale 1 puff into the lungs in the morning and at bedtime. 180 each 6   gabapentin (NEURONTIN) 100 MG capsule Take 100 mg by mouth 2 (two) times daily.     insulin  aspart (NOVOLOG ) 100 UNIT/ML injection Inject 5 Units into the skin 3 (three) times daily before meals.     insulin  degludec (TRESIBA FLEXTOUCH) 100 UNIT/ML FlexTouch Pen Inject 20 Units into the skin daily. 20 units once daily - REPLACES Levemir      Iron , Ferrous Sulfate , 325 (65 Fe) MG TABS Take 325 mg by mouth every other day. 90 tablet 3   losartan -hydrochlorothiazide  (HYZAAR) 100-25 MG tablet Take 1 tablet by mouth daily.      megestrol  (MEGACE ) 40 MG tablet Take 1 tablet (40 mg total) by mouth daily. 30 tablet 2   metoprolol  succinate (TOPROL -XL) 50 MG 24 hr tablet Take 1 tablet (50 mg total) by mouth daily. 30 tablet 0   montelukast  (SINGULAIR ) 10 MG tablet Take 10 mg by mouth at bedtime.     ondansetron  (ZOFRAN ) 8 MG tablet Take 1 tablet (8 mg total) by mouth every 8 (eight) hours as needed for nausea or vomiting. 30 tablet 0   ONETOUCH VERIO test strip 1 each by Other route 3 (three) times daily. 100 each 10   Tiotropium Bromide Monohydrate  (SPIRIVA  RESPIMAT) 2.5 MCG/ACT AERS Inhale 2 puffs into the lungs daily. 60 each 11   No current facility-administered medications for this visit.   Facility-Administered Medications Ordered in Other Visits  Medication Dose Route Frequency Provider Last Rate Last Admin  0.9 %  sodium chloride  infusion   Intravenous Continuous Kimarie Coor J, MD 10 mL/hr at 10/23/23 1121 New Bag at 10/23/23 1121   heparin  lock flush 100 unit/mL  500 Units Intracatheter Once PRN Brayleigh Rybacki J, MD       PACLitaxel  (TAXOL ) 150 mg in sodium chloride  0.9 % 250 mL chemo infusion (</= 80mg /m2)  80 mg/m2 (Treatment Plan Recorded) Intravenous Once Fatema Rabe J, MD        OBJECTIVE: Vitals:   10/23/23 1016  BP: 108/60  Pulse: 75  Resp: 16  Temp: (!) 97 F (36.1 C)  SpO2: 98%       Body mass index is 28.35 kg/m.    ECOG FS:1 - Symptomatic but completely ambulatory  General: Well-developed, well-nourished, no acute distress. Eyes: Pink conjunctiva, anicteric sclera. HEENT: Normocephalic, moist mucous membranes. Lungs: No audible wheezing or coughing. Heart: Regular rate and rhythm. Abdomen: Soft, nontender, no obvious distention. Musculoskeletal: No edema, cyanosis, or clubbing. Neuro: Alert, answering all questions appropriately. Cranial nerves grossly intact. Skin: No rashes or petechiae noted. Psych: Normal affect.  LAB RESULTS:  Lab Results  Component Value Date    NA 139 10/23/2023   K 3.2 (L) 10/23/2023   CL 105 10/23/2023   CO2 27 10/23/2023   GLUCOSE 163 (H) 10/23/2023   BUN 25 (H) 10/23/2023   CREATININE 1.27 (H) 10/23/2023   CALCIUM  8.6 (L) 10/23/2023   PROT 5.2 (L) 10/23/2023   ALBUMIN 3.1 (L) 10/23/2023   AST 13 (L) 10/23/2023   ALT 12 10/23/2023   ALKPHOS 65 10/23/2023   BILITOT 0.7 10/23/2023   GFRNONAA 45 (L) 10/23/2023   GFRAA >60 03/27/2017    Lab Results  Component Value Date   WBC 3.8 (L) 10/23/2023   NEUTROABS 3.1 10/23/2023   HGB 8.8 (L) 10/23/2023   HCT 27.7 (L) 10/23/2023   MCV 89.9 10/23/2023   PLT 158 10/23/2023     STUDIES: NM PET Image Restag (PS) Skull Base To Thigh Result Date: 10/15/2023 CLINICAL DATA:  Subsequent treatment strategy for endometrial carcinoma. EXAM: NUCLEAR MEDICINE PET SKULL BASE TO THIGH TECHNIQUE: 9.5 mCi F-18 FDG was injected intravenously. Full-ring PET imaging was performed from the skull base to thigh after the radiotracer. CT data was obtained and used for attenuation correction and anatomic localization. Fasting blood glucose: 111 mg/dl COMPARISON:  FDG PET scan 06/14/2023 FINDINGS: Choose one NECK: No hypermetabolic lymph nodes in the neck. Incidental CT findings: None. CHEST: No hypermetabolic mediastinal or hilar nodes. No suspicious pulmonary nodules on the CT scan. Incidental CT findings: Port in the anterior chest wall with tip in distal SVC. ABDOMEN/PELVIS: Metabolic activity again demonstrated centrally within the uterus with SUV max 11.8 compared SUV max equal 16.5 4A reduction activity. No radiotracer avid pelvic lymph nodes. No retroperitoneal nodes. No liver metastasis. Resolution of previously hypermetabolic RIGHT common iliac lymph node. Incidental CT findings: None. SKELETON: Metabolic activity associated with a healed RIGHT rib fracture on image 56. favor benign posttraumatic findings. Multiple additional healed rib fractures on the RIGHT. Potential re-injury of this rib  Incidental CT findings: None. IMPRESSION: 1. Decreased metabolic activity within the endometrium. 2. Resolution RIGHT common iliac lymph node metabolic activity. 3. No hypermetabolic pelvic or abdominal lymph nodes 4. No distant metastatic disease. Electronically Signed   By: Jackquline Boxer M.D.   On: 10/15/2023 14:11      ONCOLOGY HISTORY: Patient was initially hesitant to undergo chemotherapy and elected to do XRT only which was completed  on April 18, 2022.  CT scan results from October 08, 2022 reviewed independently with no obvious evidence of progressive disease. She completed 6 cycles of carboplatinum, Taxol , and Keytruda  on October 03, 2022.  Patient then initiated maintenance Keytruda  on October 23, 2022.  PET scan results from January 15, 2023 reviewed independently with mild nonspecific residual hypermetabolism in the lower uterine segment as well as small bilateral common iliac nodes possibly suggesting nodal metastasis.  Patient was seen by gynecology oncology who determined surgical intervention is not an option.  They recommend discontinuing Keytruda  for progressive disease and initiating single agent Doxil  every 28 days.  Patient received 3 cycles of Doxil  before progression of disease her last dose was given on June 05, 2023.  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  Repeat PET scan on October 15, 2023 reviewed independently with significant improvement of disease burden.  Internal exam by gynecology oncology today also confirmed disease improvement.  Proceed with cycle 4, day 8 of single agent Taxol  today.  Return to clinic in 1 week for further evaluation and consideration of cycle 4, day 15.  Plan to reimage after the conclusion of cycle 6.  If residual disease at that time, can consider internal brachytherapy at [facility].   Port:  Port revision successful.  Proceed with treatment as above. Hyperglycemia: Chronic and unchanged.   Hypokalemia: Mild, monitor. Renal insufficiency: Encouraged increased fluid intake. Leukopenia: Mild, monitor. Anemia: Globin is trended down slightly to 8.8, monitor. Vaginal bleeding: Improved.  Continue follow-up with gynecology oncology as needed.   Claudication symptoms: Resolved.  Patient underwent revascularization procedure on February 04, 2023. Right hamstring tendon rupture: Patient reports there is no plan for surgical repair. Rash: Resolved.  Likely secondary to Doxil  which has been discontinued.   Poor appetite/weight loss: Continue Megace  as prescribed.  Patient refuses referral to dietary.   Neuropathy/dizziness: Chronic and unchanged.  Continue follow-up with neurology as recommended.   Falls: Intermittent.  Patient now has 2 walkers at her home upstairs and downstairs.  She admits to only using them occasionally.   Patient expressed understanding and was in agreement with this plan. She also understands that She can call clinic at any time with any questions, concerns, or complaints.    Cancer Staging  Endometrial cancer [facility]) Staging form: Corpus Uteri - Carcinoma and Carcinosarcoma, AJCC 8th Edition - Clinical stage from 05/30/2022: FIGO Stage IIIB (cT3b, cN0, cM0) - Signed by Jacobo Evalene PARAS, MD on 05/30/2022 Stage prefix: Initial diagnosis   Evalene PARAS Jacobo, MD   10/23/2023 12:00 PM

## 2023-10-29 ENCOUNTER — Other Ambulatory Visit

## 2023-10-29 ENCOUNTER — Other Ambulatory Visit: Payer: Self-pay | Admitting: *Deleted

## 2023-10-29 DIAGNOSIS — C541 Malignant neoplasm of endometrium: Secondary | ICD-10-CM

## 2023-10-29 DIAGNOSIS — E118 Type 2 diabetes mellitus with unspecified complications: Secondary | ICD-10-CM

## 2023-10-29 DIAGNOSIS — Z794 Long term (current) use of insulin: Secondary | ICD-10-CM

## 2023-10-29 NOTE — Progress Notes (Signed)
 10/29/2023 Name: Terri [name] MRN: [id number] DOB: [birth year]  Subjective  Chief Complaint  Patient presents with   Diabetes   Reason for visit: ?  Terri [name] is a 73 y.o. female with a history of diabetes (type 2), who presents today for a follow up visit for the management of diabetes.   Pertinent PMH also includes atherosclerosis w intermittent claudication, HTN, COPD, HLD, Endometrial cancer, obesity.  Known DM Complications:  nephropathy w microalbuminuria; PAD with claudication BLE; HTN    Care Team: Primary Care Provider: No primary care provider on file. Terri [name] ? [name] 11/04/23 Pulmonology Dr. Isadora; Oncology Dr. Jacobo; Cardiology at [facility]  Last Diabetes-Related Visit: With PCP 09/25/23 Recent Summary of Change: 6/20: Patient decision to stop Ozempic   5/28: A1c 6.7%. BG all within goal though with occasional overnight lows, as low as 50s. ??Tresiba to 20 u/d. Stop glipizide .  ??Tresiba 22 units daily (12%??). 25 units on day of chemo and day following chemo.   Since Last visit / History of Present Illness: ?  Patient reports doing well overall. Has stopped using Ozempic  as previous documented. Notes she has resumed chemo though now on weekly maintenance regimen (pt reports 3 weeks on, 1 off).  Reported DM Regimen: ?  Novolog  (aspart) 5 units up to three times daily before meals (sometimes 7 units if BG high prior to eating) [place] U100 20 units daily  DM medications tried in the past:?  Metformin  (diarrhea); switched to glipizide  Glipizide  (D/c 08/2023 due to increasing hypoglycemia) Ozempic  (tolerated well, self-discontinued ~10/18/23 due to cancer-related weight loss)  SMBG: Glucometer - Checks BG twice daily via glucometer FBG: 116, 137, 112, 71, 457 (chemo), 225, 183, 181, 91   Hypo/Hyperglycemia: ?  Symptoms of hypoglycemia since last visit:? yes. One instance (first low in many weeks) . Occurred after using Novolog  and not being able to finish breakfast.  Treated appropriately. No further instances.   Reported Diet:  Patient typically eats 3 meals per day. S/p chemo, she reports her appetite has not been the same. Consuming less than previous. Notes she still attempts to eat something even if not hungry.  Breakfast (8:30): Varies Lunch (12-1pm): Pizza (under 50 carbs); Sandwich; Chicken salad+crackers Dinner (7-7:30 pm): Largest meal of the day Snacks: Sweet tooth (states that most sweets are sugar-free though in more recent weeks, with her low Bgs she has been having more ice cream and pound cakes. No more lows the past week, plans to discontinue this)  Exercise: No  DM Prevention:  Statin: Taking; high intensity.?  History of chronic kidney disease? yes History of albuminuria? yes, last UACR on 01/24/23 = 100.4 mg/g ACE/ARB - Taking; Urine MA/CR Ratio - elevated urinary albumin excretion.  Last foot exam: 02/25/2023 Tobacco Use: Former   Cardiovascular Risk Reduction History of clinical ASCVD? PAD w Claudication The 10-year ASCVD risk score (Arnett DK, et al., 2019) is: 32% History of heart failure? no History of hyperlipidemia? yes Current BMI: 28.3 kg/m2 (Ht 62 in, Wt 70.3 kg) Taking statin? yes; high intensity (atorvastaitn 80 mg) Taking aspirin ? indicated (secondary prevention); Taking clopidogrel    Taking SGLT-2i? no Taking GLP- 1 RA? yes    _______________________________________________  Objective    Review of Systems:? Limited in the setting of virtual visit  GI:? No nausea, vomiting, constipation, diarrhea, abdominal pain, dyspepsia, change in bowel habits  Endocrine:? No polyuria, polyphagia or blurred vision    Physical Examination:  Vitals:  Wt Readings from Last 3 Encounters:  10/23/23  155 lb (70.3 kg)  10/16/23 157 lb 8 oz (71.4 kg)  09/25/23 166 lb 4 oz (75.4 kg)   BP Readings from Last 3 Encounters:  10/23/23 108/60  10/16/23 (!) 142/67  10/16/23 (!) 160/66   Pulse Readings from Last 3 Encounters:   10/23/23 75  10/16/23 68  10/16/23 67   Labs:?  Lab Results  Component Value Date   HGBA1C 6.1 (A) 09/25/2023   HGBA1C 5.7 (A) 06/26/2023   HGBA1C 7.2 (H) 12/25/2022   GLUCOSE 163 (H) 10/23/2023   CREATININE 1.27 (H) 10/23/2023   CREATININE 0.86 10/16/2023   CREATININE 0.80 09/18/2023   GFR 52.91 (L) 01/24/2023   Lab Results  Component Value Date   CHOL 156 01/24/2023   LDLCALC 87 01/24/2023   HDL 46.20 01/24/2023   TRIG 112.0 01/24/2023   ALT 12 10/23/2023   ALT 10 10/16/2023   AST 13 (L) 10/23/2023   AST 15 10/16/2023     Chemistry      Component Value Date/Time   NA 139 10/23/2023 1006   K 3.2 (L) 10/23/2023 1006   CL 105 10/23/2023 1006   CO2 27 10/23/2023 1006   BUN 25 (H) 10/23/2023 1006   CREATININE 1.27 (H) 10/23/2023 1006      Component Value Date/Time   CALCIUM  8.6 (L) 10/23/2023 1006   ALKPHOS 65 10/23/2023 1006   AST 13 (L) 10/23/2023 1006   ALT 12 10/23/2023 1006   BILITOT 0.7 10/23/2023 1006     The 10-year ASCVD risk score (Arnett DK, et al., 2019) is: 32%   Assessment and Plan:   1. Diabetes, type 2: controlled A1c 6.1% (09/25/23), goal <7.5-8% without hypoglycemia given medical complexity and active cancer treatment. Sugars have remained stable/predictable. Hyperglycemia day of chemo with normalization over the following ~3 days. Most recent chemo with BG in the 400s for a short time. Patient declines correctional scale insulin . Previously instructions for basal increase on chemo days was too difficult to remember. Patient to continue monitoring BG twice daily. Instructed to reach out if consistently starting to see numbers higher than current on day of chemo, or if seeing sugars taking longer to return to baseline after each session.  Current regimen: Tresiba 20 units daily, Novolog  5 units TIDAC Continue medications today without changes. Continue BID blood glucose checks. Not using CGM.  Reviewed s/sx/tx hypoglycemia.  Future  Consideration: SGLT2i: Ideal agent esp in the setting of diabetic kidney disease w albuminuria >30 mg/g depending on goals of care in the setting of her current cancer treatment Does have hx urinary incontinence, though don't expect significant diuresis with A1c so low (though would have c/f worsening symptoms now in the setting of chemo/steroid-induced hyperglycemia).    Follow up PCP TOC visit scheduled ~1 week.  PharmD f/u via phone ~4 weeks after PCP visit.  Patient provided with direct line for questions/concerns prior to next follow up visit.   [name], PharmD Clinical Pharmacist [facility] [phone]

## 2023-10-30 ENCOUNTER — Inpatient Hospital Stay

## 2023-10-30 ENCOUNTER — Other Ambulatory Visit: Payer: Self-pay

## 2023-10-30 ENCOUNTER — Ambulatory Visit: Admitting: Student in an Organized Health Care Education/Training Program

## 2023-10-30 ENCOUNTER — Other Ambulatory Visit

## 2023-10-30 ENCOUNTER — Encounter: Payer: Self-pay | Admitting: Oncology

## 2023-10-30 ENCOUNTER — Inpatient Hospital Stay: Attending: Obstetrics and Gynecology | Admitting: Oncology

## 2023-10-30 VITALS — BP 122/88 | HR 66 | Temp 97.2°F | Resp 16 | Ht 62.0 in | Wt 161.0 lb

## 2023-10-30 DIAGNOSIS — D649 Anemia, unspecified: Secondary | ICD-10-CM | POA: Insufficient documentation

## 2023-10-30 DIAGNOSIS — C541 Malignant neoplasm of endometrium: Secondary | ICD-10-CM | POA: Diagnosis not present

## 2023-10-30 DIAGNOSIS — D696 Thrombocytopenia, unspecified: Secondary | ICD-10-CM | POA: Diagnosis not present

## 2023-10-30 DIAGNOSIS — Z5111 Encounter for antineoplastic chemotherapy: Secondary | ICD-10-CM | POA: Diagnosis not present

## 2023-10-30 DIAGNOSIS — E1165 Type 2 diabetes mellitus with hyperglycemia: Secondary | ICD-10-CM | POA: Diagnosis not present

## 2023-10-30 DIAGNOSIS — N289 Disorder of kidney and ureter, unspecified: Secondary | ICD-10-CM | POA: Diagnosis not present

## 2023-10-30 DIAGNOSIS — D72819 Decreased white blood cell count, unspecified: Secondary | ICD-10-CM | POA: Insufficient documentation

## 2023-10-30 DIAGNOSIS — G629 Polyneuropathy, unspecified: Secondary | ICD-10-CM | POA: Insufficient documentation

## 2023-10-30 DIAGNOSIS — E876 Hypokalemia: Secondary | ICD-10-CM | POA: Diagnosis not present

## 2023-10-30 LAB — CBC WITH DIFFERENTIAL (CANCER CENTER ONLY)
Abs Immature Granulocytes: 0.01 10*3/uL (ref 0.00–0.07)
Basophils Absolute: 0 10*3/uL (ref 0.0–0.1)
Basophils Relative: 0 %
Eosinophils Absolute: 0 10*3/uL (ref 0.0–0.5)
Eosinophils Relative: 2 %
HCT: 24.6 % — ABNORMAL LOW (ref 36.0–46.0)
Hemoglobin: 7.9 g/dL — ABNORMAL LOW (ref 12.0–15.0)
Immature Granulocytes: 1 %
Lymphocytes Relative: 9 %
Lymphs Abs: 0.2 10*3/uL — ABNORMAL LOW (ref 0.7–4.0)
MCH: 28.3 pg (ref 26.0–34.0)
MCHC: 32.1 g/dL (ref 30.0–36.0)
MCV: 88.2 fL (ref 80.0–100.0)
Monocytes Absolute: 0.1 10*3/uL (ref 0.1–1.0)
Monocytes Relative: 6 %
Neutro Abs: 1.6 10*3/uL — ABNORMAL LOW (ref 1.7–7.7)
Neutrophils Relative %: 82 %
Platelet Count: 136 10*3/uL — ABNORMAL LOW (ref 150–400)
RBC: 2.79 MIL/uL — ABNORMAL LOW (ref 3.87–5.11)
RDW: 15.4 % (ref 11.5–15.5)
WBC Count: 2 10*3/uL — ABNORMAL LOW (ref 4.0–10.5)
nRBC: 0 % (ref 0.0–0.2)

## 2023-10-30 LAB — CMP (CANCER CENTER ONLY)
ALT: 10 U/L (ref 0–44)
AST: 13 U/L — ABNORMAL LOW (ref 15–41)
Albumin: 2.8 g/dL — ABNORMAL LOW (ref 3.5–5.0)
Alkaline Phosphatase: 59 U/L (ref 38–126)
Anion gap: 7 (ref 5–15)
BUN: 18 mg/dL (ref 8–23)
CO2: 26 mmol/L (ref 22–32)
Calcium: 8.1 mg/dL — ABNORMAL LOW (ref 8.9–10.3)
Chloride: 106 mmol/L (ref 98–111)
Creatinine: 0.9 mg/dL (ref 0.44–1.00)
GFR, Estimated: >60 mL/min (ref >60)
Glucose, Bld: 131 mg/dL — ABNORMAL HIGH (ref 70–99)
Potassium: 3.1 mmol/L — ABNORMAL LOW (ref 3.5–5.1)
Sodium: 139 mmol/L (ref 135–145)
Total Bilirubin: 0.8 mg/dL (ref 0.0–1.2)
Total Protein: 5 g/dL — ABNORMAL LOW (ref 6.5–8.1)

## 2023-10-30 LAB — SAMPLE TO BLOOD BANK

## 2023-10-30 LAB — MAGNESIUM: Magnesium: 1.5 mg/dL — ABNORMAL LOW (ref 1.7–2.4)

## 2023-10-30 MED ORDER — DEXAMETHASONE SODIUM PHOSPHATE 10 MG/ML IJ SOLN
10.0000 mg | Freq: Once | INTRAMUSCULAR | Status: AC
  Administered 2023-10-30: 10 mg via INTRAVENOUS
  Filled 2023-10-30: qty 1

## 2023-10-30 MED ORDER — DIPHENHYDRAMINE HCL 50 MG/ML IJ SOLN
25.0000 mg | Freq: Once | INTRAMUSCULAR | Status: AC
  Administered 2023-10-30: 25 mg via INTRAVENOUS
  Filled 2023-10-30: qty 1

## 2023-10-30 MED ORDER — HEPARIN SOD (PORK) LOCK FLUSH 100 UNIT/ML IV SOLN
500.0000 [IU] | Freq: Once | INTRAVENOUS | Status: AC | PRN
  Administered 2023-10-30: 500 [IU]
  Filled 2023-10-30: qty 5

## 2023-10-30 MED ORDER — POTASSIUM CHLORIDE 20 MEQ/100ML IV SOLN: 20.0000 meq | Freq: Once | INTRAVENOUS | Status: DC

## 2023-10-30 MED ORDER — SODIUM CHLORIDE 0.9 % IV SOLN
80.0000 mg/m2 | Freq: Once | INTRAVENOUS | Status: AC
  Administered 2023-10-30: 150 mg via INTRAVENOUS
  Filled 2023-10-30: qty 25

## 2023-10-30 MED ORDER — MAGNESIUM SULFATE 2 GM/50ML IV SOLN
2.0000 g | Freq: Once | INTRAVENOUS | Status: AC
  Administered 2023-10-30: 2 g via INTRAVENOUS
  Filled 2023-10-30: qty 50

## 2023-10-30 MED ORDER — SODIUM CHLORIDE 0.9 % IV SOLN
INTRAVENOUS | Status: DC
  Filled 2023-10-30: qty 250

## 2023-10-30 MED ORDER — POTASSIUM CHLORIDE 20 MEQ/100ML IV SOLN
20.0000 meq | Freq: Once | INTRAVENOUS | Status: AC
  Administered 2023-10-30: 20 meq via INTRAVENOUS

## 2023-10-30 MED ORDER — FAMOTIDINE IN NACL 20-0.9 MG/50ML-% IV SOLN
20.0000 mg | Freq: Once | INTRAVENOUS | Status: AC
  Administered 2023-10-30: 20 mg via INTRAVENOUS
  Filled 2023-10-30: qty 50

## 2023-10-30 NOTE — Patient Instructions (Signed)
 [facility] - A DEPT OF [facility]  Discharge Instructions: Thank you for choosing [facility] to provide your oncology and hematology care.  If you have a lab appointment with the Cancer Center, please go directly to the Cancer Center and check in at the registration area.  Wear comfortable clothing and clothing appropriate for easy access to any Portacath or PICC line.   We strive to give you quality time with your provider. You may need to reschedule your appointment if you arrive late (15 or more minutes).  Arriving late affects you and other patients whose appointments are after yours.  Also, if you miss three or more appointments without notifying the office, you may be dismissed from the clinic at the provider's discretion.      For prescription refill requests, have your pharmacy contact our office and allow 72 hours for refills to be completed.    Today you received the following chemotherapy and/or immunotherapy agents : PACLitaxel       To help prevent nausea and vomiting after your treatment, we encourage you to take your nausea medication as directed.  BELOW ARE SYMPTOMS THAT SHOULD BE REPORTED IMMEDIATELY: *FEVER GREATER THAN 100.4 F (38 C) OR HIGHER *CHILLS OR SWEATING *NAUSEA AND VOMITING THAT IS NOT CONTROLLED WITH YOUR NAUSEA MEDICATION *UNUSUAL SHORTNESS OF BREATH *UNUSUAL BRUISING OR BLEEDING *URINARY PROBLEMS (pain or burning when urinating, or frequent urination) *BOWEL PROBLEMS (unusual diarrhea, constipation, pain near the anus) TENDERNESS IN MOUTH AND THROAT WITH OR WITHOUT PRESENCE OF ULCERS (sore throat, sores in mouth, or a toothache) UNUSUAL RASH, SWELLING OR PAIN  UNUSUAL VAGINAL DISCHARGE OR ITCHING   Items with * indicate a potential emergency and should be followed up as soon as possible or go to the Emergency Department if any problems should occur.  Please show the CHEMOTHERAPY ALERT CARD or IMMUNOTHERAPY  ALERT CARD at check-in to the Emergency Department and triage nurse.  Should you have questions after your visit or need to cancel or reschedule your appointment, please contact [facility] - A DEPT OF [facility]  [phone] and follow the prompts.  Office hours are 8:00 a.m. to 4:30 p.m. Monday - Friday. Please note that voicemails left after 4:00 p.m. may not be returned until the following business day.  We are closed weekends and major holidays. You have access to a nurse at all times for urgent questions. Please call the main number to the clinic [phone] and follow the prompts.  For any non-urgent questions, you may also contact your provider using MyChart. We now offer e-Visits for anyone 45 and older to request care online for non-urgent symptoms. For details visit [email or web address].   Also download the MyChart app! Go to the app store, search "MyChart", open the app, select [place], and log in with your MyChart username and password.

## 2023-10-30 NOTE — Progress Notes (Signed)
 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  INTERVAL HISTORY: Patient returns to clinic today for further evaluation and consideration of cycle 4, day 15 of single agent Taxol .  She had one fall earlier this week hitting her head, but did not seek medical attention. She continues to have a chronic peripheral neuropathy.  She does not complain of any weakness or fatigue. Her vaginal bleeding has improved, but still evident.  She has no other neurologic complaints.  She denies any recent fevers or illnesses.  She has no chest pain, shortness of breath, cough, or hemoptysis.  She denies any nausea, vomiting, constipation, or diarrhea.  She has no urinary complaints.  Patient offers no further specific complaints today.  REVIEW OF SYSTEMS:   Review of Systems  Constitutional:  Positive for malaise/fatigue. Negative for fever and weight loss.  Respiratory: Negative.  Negative for cough, hemoptysis and shortness of breath.   Cardiovascular: Negative.  Negative for chest pain and leg swelling.  Gastrointestinal: Negative.  Negative for abdominal pain, blood in stool and melena.  Genitourinary: Negative.  Negative for dysuria.  Musculoskeletal:  Positive for falls. Negative for back pain.  Skin: Negative.  Negative for rash.  Neurological:  Positive for sensory change and weakness. Negative for dizziness, focal weakness and headaches.  Psychiatric/Behavioral: Negative.  The  patient is not nervous/anxious.     As per HPI. Otherwise, a complete review of systems is negative.  PAST MEDICAL HISTORY: Past Medical History:  Diagnosis Date   Adenomatous colon polyp    Aortic stenosis    Arthritis    Asthma    Cancer (HCC)    Claudication (HCC)    COPD (chronic obstructive pulmonary disease) (HCC)    Depression    Diabetes mellitus without complication (HCC)    Esophageal dysphagia    GERD (gastroesophageal reflux disease)    Hyperlipemia    Hypertension    Obesity    Severe obesity (BMI 35.0-35.9 with comorbidity) (HCC)     PAST SURGICAL HISTORY: Past Surgical History:  Procedure Laterality Date   BLADDER SURGERY     COLONOSCOPY WITH PROPOFOL  N/A 08/05/2015   Procedure: COLONOSCOPY WITH PROPOFOL ;  Surgeon: Donnice Vaughn Manes, MD;  Location: [facility] ENDOSCOPY;  Service: Endoscopy;  Laterality: N/A;   ESOPHAGOGASTRODUODENOSCOPY N/A 01/09/2022   Procedure: ESOPHAGOGASTRODUODENOSCOPY (EGD);  Surgeon: Maryruth Ole DASEN, MD;  Location: [facility] ENDOSCOPY;  Service: Endoscopy;  Laterality: N/A;   FOOT SURGERY Left    FRACTURE SURGERY     IR CV LINE INJECTION  06/22/2022   IR IMAGING GUIDED PORT INSERTION  06/05/2022   IR PORT REPAIR CENTRAL VENOUS ACCESS DEVICE  07/02/2022   LOWER EXTREMITY ANGIOGRAPHY Left 02/04/2023   Procedure: Lower Extremity Angiography;  Surgeon: Marea Selinda RAMAN, MD;  Location: [facility] INVASIVE CV LAB;  Service: Cardiovascular;  Laterality: Left;   ORIF ANKLE FRACTURE Left 03/26/2017   Procedure: OPEN REDUCTION INTERNAL FIXATION (ORIF) ANKLE FRACTURE;  Surgeon: Edie Norleen PARAS, MD;  Location: [facility] ORS;  Service: Orthopedics;  Laterality: Left;    FAMILY HISTORY: Family History  Problem  Relation Age of Onset   CVA Mother    Diabetes Mother    CAD Father    Breast cancer Neg Hx     ADVANCED DIRECTIVES (Y/N):  N  HEALTH MAINTENANCE: Social History   Tobacco Use   Smoking status: Former    Current packs/day: 1.00    Average packs/day: 1  pack/day for 40.0 years (40.0 ttl pk-yrs)    Types: Cigarettes   Smokeless tobacco: Never   Tobacco comments:    Quite 2015  Vaping Use   Vaping status: Never Used  Substance Use Topics   Alcohol use: No   Drug use: No     Colonoscopy:  PAP:  Bone density:  Lipid panel:  No Known Allergies  Current Outpatient Medications  Medication Sig Dispense Refill   albuterol  (VENTOLIN  HFA) 108 (90 Base) MCG/ACT inhaler Inhale 2 puffs into the lungs every 6 (six) hours as needed for wheezing or shortness of breath. 1 each 1   amLODipine  (NORVASC ) 10 MG tablet Take 1 tablet (10 mg total) by mouth daily. 30 tablet 0   aspirin  EC 81 MG tablet Take 1 tablet (81 mg total) by mouth daily. Swallow whole. 150 tablet 2   atorvastatin  (LIPITOR) 80 MG tablet Take 80 mg by mouth daily.     buPROPion  (WELLBUTRIN  XL) 300 MG 24 hr tablet Take 1 tablet (300 mg total) by mouth daily. 30 tablet 0   Cholecalciferol  (VITAMIN D3) 50 MCG (2000 UT) capsule Take 2,000 Units by mouth daily.     citalopram  (CELEXA ) 20 MG tablet Take 20 mg by mouth daily.     cloNIDine  (CATAPRES ) 0.2 MG tablet Take 0.2 mg by mouth 2 (two) times daily.     clopidogrel  (PLAVIX ) 75 MG tablet Take 1 tablet (75 mg total) by mouth daily. 90 tablet 3   cyanocobalamin  (VITAMIN B12) 1000 MCG tablet Take 1 tablet (1,000 mcg total) by mouth daily. 90 tablet 3   fluticasone -salmeterol (WIXELA INHUB) 250-50 MCG/ACT AEPB Inhale 1 puff into the lungs in the morning and at bedtime. 180 each 6   gabapentin (NEURONTIN) 100 MG capsule Take 100 mg by mouth 2 (two) times daily.     insulin  aspart (NOVOLOG ) 100 UNIT/ML injection Inject 5 Units into the skin 3 (three) times daily before meals.     insulin  degludec (TRESIBA FLEXTOUCH) 100 UNIT/ML FlexTouch Pen Inject 20 Units into the skin daily. 20 units once daily - REPLACES Levemir      Iron , Ferrous Sulfate , 325 (65 Fe) MG TABS Take 325 mg by mouth every other day. 90 tablet 3    losartan -hydrochlorothiazide  (HYZAAR) 100-25 MG tablet Take 1 tablet by mouth daily.     megestrol  (MEGACE ) 40 MG tablet Take 1 tablet (40 mg total) by mouth daily. 30 tablet 2   metoprolol  succinate (TOPROL -XL) 50 MG 24 hr tablet Take 1 tablet (50 mg total) by mouth daily. 30 tablet 0   montelukast  (SINGULAIR ) 10 MG tablet Take 10 mg by mouth at bedtime.     ondansetron  (ZOFRAN ) 8 MG tablet Take 1 tablet (8 mg total) by mouth every 8 (eight) hours as needed for nausea or vomiting. 30 tablet 0   ONETOUCH VERIO test strip 1 each by Other route 3 (three) times daily. 100 each 10   Tiotropium Bromide Monohydrate  (SPIRIVA  RESPIMAT) 2.5 MCG/ACT AERS Inhale 2 puffs into the lungs daily. 60 each 11   No current facility-administered medications for this visit.   Facility-Administered Medications Ordered in Other Visits  Medication Dose Route Frequency Provider Last Rate Last Admin   0.9 %  sodium chloride  infusion   Intravenous Continuous Marisa Hage J, MD 10 mL/hr at 10/30/23 0947 New Bag at 10/30/23 0947   dexamethasone  (DECADRON ) injection 10 mg  10 mg Intravenous Once Daimon Kean J, MD       diphenhydrAMINE  (BENADRYL ) injection 25 mg  25 mg Intravenous Once Marytza Grandpre J, MD       famotidine  (PEPCID ) IVPB 20 mg premix  20 mg Intravenous Once Wynn Kernes J, MD       heparin  lock flush 100 unit/mL  500 Units Intracatheter Once PRN Laryssa Hassing J, MD       PACLitaxel  (TAXOL ) 150 mg in sodium chloride  0.9 % 250 mL chemo infusion (</= 80mg /m2)  80 mg/m2 (Treatment Plan Recorded) Intravenous Once Jacobo Evalene PARAS, MD        OBJECTIVE: Vitals:   10/30/23 0843  BP: 122/88  Pulse: 66  Resp: 16  Temp: (!) 97.2 F (36.2 C)  SpO2: 94%        Body mass index is 29.45 kg/m.    ECOG FS:1 - Symptomatic but completely ambulatory  General: Well-developed, well-nourished, no acute distress. Eyes: Pink conjunctiva, anicteric sclera. HEENT: Normocephalic, moist mucous  membranes. Lungs: No audible wheezing or coughing. Heart: Regular rate and rhythm. Abdomen: Soft, nontender, no obvious distention. Musculoskeletal: No edema, cyanosis, or clubbing. Neuro: Alert, answering all questions appropriately. Cranial nerves grossly intact. Skin: No rashes or petechiae noted. Psych: Normal affect.  LAB RESULTS:  Lab Results  Component Value Date   NA 139 10/30/2023   K 3.1 (L) 10/30/2023   CL 106 10/30/2023   CO2 26 10/30/2023   GLUCOSE 131 (H) 10/30/2023   BUN 18 10/30/2023   CREATININE 0.90 10/30/2023   CALCIUM  8.1 (L) 10/30/2023   PROT 5.0 (L) 10/30/2023   ALBUMIN 2.8 (L) 10/30/2023   AST 13 (L) 10/30/2023   ALT 10 10/30/2023   ALKPHOS 59 10/30/2023   BILITOT 0.8 10/30/2023   GFRNONAA >60 10/30/2023   GFRAA >60 03/27/2017    Lab Results  Component Value Date   WBC 2.0 (L) 10/30/2023   NEUTROABS 1.6 (L) 10/30/2023   HGB 7.9 (L) 10/30/2023   HCT 24.6 (L) 10/30/2023   MCV 88.2 10/30/2023   PLT 136 (L) 10/30/2023     STUDIES: NM PET Image Restag (PS) Skull Base To Thigh Result Date: 10/15/2023 CLINICAL DATA:  Subsequent treatment strategy for endometrial carcinoma. EXAM: NUCLEAR MEDICINE PET SKULL BASE TO THIGH TECHNIQUE: 9.5 mCi F-18 FDG was injected intravenously. Full-ring PET imaging was performed from the skull base to thigh after the radiotracer. CT data was obtained and used for attenuation correction and anatomic localization. Fasting blood glucose: 111 mg/dl COMPARISON:  FDG PET scan 06/14/2023 FINDINGS: Choose one NECK: No hypermetabolic lymph nodes in the neck. Incidental CT findings: None. CHEST: No hypermetabolic mediastinal or hilar nodes. No suspicious pulmonary nodules on the CT scan. Incidental CT findings: Port in the anterior chest wall with tip in distal SVC. ABDOMEN/PELVIS: Metabolic activity again demonstrated centrally within the uterus with SUV max 11.8 compared SUV max equal 16.5 4A reduction activity. No radiotracer avid  pelvic lymph nodes. No retroperitoneal nodes. No liver metastasis. Resolution of previously hypermetabolic RIGHT common iliac lymph node. Incidental CT findings: None. SKELETON: Metabolic activity associated with a healed RIGHT rib fracture on image 56. favor benign posttraumatic findings. Multiple additional healed rib fractures on the RIGHT. Potential re-injury of this  rib Incidental CT findings: None. IMPRESSION: 1. Decreased metabolic activity within the endometrium. 2. Resolution RIGHT common iliac lymph node metabolic activity. 3. No hypermetabolic pelvic or abdominal lymph nodes 4. No distant metastatic disease. Electronically Signed   By: Jackquline Boxer M.D.   On: 10/15/2023 14:11      ONCOLOGY HISTORY: Patient was initially hesitant to undergo chemotherapy and elected to do XRT only which was completed on April 18, 2022.  CT scan results from October 08, 2022 reviewed independently with no obvious evidence of progressive disease. She completed 6 cycles of carboplatinum, Taxol , and Keytruda  on October 03, 2022.  Patient then initiated maintenance Keytruda  on October 23, 2022.  PET scan results from January 15, 2023 reviewed independently with mild nonspecific residual hypermetabolism in the lower uterine segment as well as small bilateral common iliac nodes possibly suggesting nodal metastasis.  Patient was seen by gynecology oncology who determined surgical intervention is not an option.  They recommend discontinuing Keytruda  for progressive disease and initiating single agent Doxil  every 28 days.  Patient received 3 cycles of Doxil  before progression of disease her last dose was given on June 05, 2023.  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  Repeat PET scan on October 15, 2023 reviewed independently with significant improvement of disease burden.   Internal exam by gynecology oncology also confirmed disease improvement.  Despite mild pancytopenia, will proceed with cycle 4, day 15 of single agent Taxol  today.  Return to clinic in 2 weeks for further evaluation and consideration of cycle 5, day 1.  Plan to reimage after the conclusion of cycle 6.  If residual disease at that time, can consider internal brachytherapy at [facility].   Port: Port revision successful.  Proceed with treatment as above. Hyperglycemia: Chronic and unchanged. Hypokalemia: Patient received 20 mEq  IV potassium today. Renal insufficiency: Resolved.   Leukopenia: Total white blood cell count has trended down to 2.0 with an ANC of 1.6.  Proceed cautiously with treatment as above.   Anemia: Hemoglobin has trended down to 7.9, monitor. Thrombocytopenia: Mild, monitor. Vaginal bleeding: Improved.  Continue follow-up with gynecology oncology as needed.   Claudication symptoms: Resolved.  Patient underwent revascularization procedure on February 04, 2023. Right hamstring tendon rupture: Patient reports there is no plan for surgical repair. Rash: Resolved.  Likely secondary to Doxil  which has been discontinued.   Poor appetite/weight loss: Continue Megace  as prescribed.  Patient refuses referral to dietary.   Neuropathy/dizziness: Chronic and unchanged.  Continue follow-up with neurology as recommended.   Falls: Intermittent.  Patient now has 2 walkers at her home upstairs and downstairs.  She admits to only using them occasionally.   Patient expressed understanding and was in agreement with this plan. She also understands that She can call clinic at any time with any questions, concerns, or complaints.    Cancer Staging  Endometrial cancer [facility]) Staging form: Corpus Uteri - Carcinoma and Carcinosarcoma, AJCC 8th Edition - Clinical stage from 05/30/2022: FIGO Stage IIIB (cT3b, cN0, cM0) - Signed by Jacobo Evalene PARAS, MD on 05/30/2022 Stage prefix: Initial  diagnosis   Evalene PARAS Jacobo, MD   10/30/2023 11:39 AM

## 2023-10-30 NOTE — Progress Notes (Signed)
 Ok to proceed with hgb 7.9 will add KCL & MG per MD

## 2023-11-04 ENCOUNTER — Encounter: Payer: Self-pay | Admitting: Nurse Practitioner

## 2023-11-04 ENCOUNTER — Ambulatory Visit (INDEPENDENT_AMBULATORY_CARE_PROVIDER_SITE_OTHER): Admitting: Nurse Practitioner

## 2023-11-04 VITALS — BP 120/50 | HR 64 | Temp 98.0°F | Ht 62.0 in | Wt 156.2 lb

## 2023-11-04 DIAGNOSIS — E785 Hyperlipidemia, unspecified: Secondary | ICD-10-CM

## 2023-11-04 DIAGNOSIS — C541 Malignant neoplasm of endometrium: Secondary | ICD-10-CM | POA: Diagnosis not present

## 2023-11-04 DIAGNOSIS — J432 Centrilobular emphysema: Secondary | ICD-10-CM | POA: Diagnosis not present

## 2023-11-04 DIAGNOSIS — E611 Iron deficiency: Secondary | ICD-10-CM

## 2023-11-04 DIAGNOSIS — D509 Iron deficiency anemia, unspecified: Secondary | ICD-10-CM | POA: Diagnosis not present

## 2023-11-04 DIAGNOSIS — I152 Hypertension secondary to endocrine disorders: Secondary | ICD-10-CM

## 2023-11-04 DIAGNOSIS — E118 Type 2 diabetes mellitus with unspecified complications: Secondary | ICD-10-CM | POA: Diagnosis not present

## 2023-11-04 DIAGNOSIS — E1169 Type 2 diabetes mellitus with other specified complication: Secondary | ICD-10-CM

## 2023-11-04 DIAGNOSIS — F39 Unspecified mood [affective] disorder: Secondary | ICD-10-CM

## 2023-11-04 DIAGNOSIS — E1159 Type 2 diabetes mellitus with other circulatory complications: Secondary | ICD-10-CM

## 2023-11-04 MED ORDER — IRON (FERROUS SULFATE) 325 (65 FE) MG PO TABS: ORAL_TABLET | ORAL | 3 refills | Status: DC

## 2023-11-04 MED ORDER — CITALOPRAM HYDROBROMIDE 20 MG PO TABS: 20.0000 mg | ORAL_TABLET | Freq: Every day | ORAL | 3 refills | Status: DC

## 2023-11-04 MED ORDER — AMLODIPINE BESYLATE 10 MG PO TABS: 10.0000 mg | ORAL_TABLET | Freq: Every day | ORAL | 3 refills | Status: DC

## 2023-11-04 MED ORDER — BUPROPION HCL ER (XL) 300 MG PO TB24: 300.0000 mg | ORAL_TABLET | Freq: Every day | ORAL | 3 refills | Status: DC

## 2023-11-04 MED ORDER — METOPROLOL SUCCINATE ER 50 MG PO TB24: 50.0000 mg | ORAL_TABLET | Freq: Every day | ORAL | 3 refills | Status: DC

## 2023-11-04 MED ORDER — ATORVASTATIN CALCIUM 80 MG PO TABS: 80.0000 mg | ORAL_TABLET | Freq: Every day | ORAL | 3 refills | Status: AC

## 2023-11-04 MED ORDER — LOSARTAN POTASSIUM-HCTZ 100-25 MG PO TABS
1.0000 | ORAL_TABLET | Freq: Every day | ORAL | 3 refills | Status: DC
Start: 2023-11-04 — End: 2024-01-03

## 2023-11-04 NOTE — Progress Notes (Signed)
 Leron Glance, NP-C Phone: [phone]  Terri [name] is a 73 y.o. female who presents today for transfer of care.   Discussed the use of AI scribe software for clinical note transcription with the patient, who gave verbal consent to proceed.  History of Present Illness   The patient is a 73 year old with endometrial cancer who presents for transfer of care.  They are currently undergoing chemotherapy for endometrial cancer, following a regimen of three weeks on and one week off, and are on their third cycle. They experience memory issues, which they attribute to the chemotherapy. They live alone with two cats and have no children.  They have a history of major depression and are taking Wellbutrin  and Celexa , which they feel help maintain mood stability.  For respiratory issues, they use Wixela and another inhaler similar to Spiriva , intended for twice-daily use, but often forget, resulting in once-daily use. They have albuterol  for occasional use but report not needing it frequently. No frequent coughing or significant shortness of breath.  They have hypertension and are on norvasc , clonidine , losartan  hydrochlorothiazide , and toprol . They sometimes forget the afternoon dose of clonidine  and report occasional dizziness but no chest pain or significant shortness of breath. They do not regularly check their blood pressure at home.  They have diabetes and use Tresiba (20 units) and Novolog , adjusting the Novolog  dose based on blood sugar readings. They stopped taking Ozempic  due to weight loss concerns. They check their blood sugar at home, usually under 200 mg/dL, though they experience occasional low blood sugar episodes. They have discontinued glipizide  as per previous medical advice.  They mention dietary challenges, including a lack of appetite and difficulty finishing meals, trying to maintain weight after significant weight loss due to cancer treatment.  They are taking iron  supplements  every other day but often forget doses and inquire about taking it daily instead.      Social History   Tobacco Use  Smoking Status Former   Current packs/day: 1.00   Average packs/day: 1 pack/day for 40.0 years (40.0 ttl pk-yrs)   Types: Cigarettes  Smokeless Tobacco Never  Tobacco Comments   Quite 2015    Current Outpatient Medications on File Prior to Visit  Medication Sig Dispense Refill   albuterol  (VENTOLIN  HFA) 108 (90 Base) MCG/ACT inhaler Inhale 2 puffs into the lungs every 6 (six) hours as needed for wheezing or shortness of breath. 1 each 1   aspirin  EC 81 MG tablet Take 1 tablet (81 mg total) by mouth daily. Swallow whole. 150 tablet 2   Cholecalciferol  (VITAMIN D3) 50 MCG (2000 UT) capsule Take 2,000 Units by mouth daily.     cloNIDine  (CATAPRES ) 0.2 MG tablet Take 0.2 mg by mouth 2 (two) times daily.     clopidogrel  (PLAVIX ) 75 MG tablet Take 1 tablet (75 mg total) by mouth daily. 90 tablet 3   cyanocobalamin  (VITAMIN B12) 1000 MCG tablet Take 1 tablet (1,000 mcg total) by mouth daily. 90 tablet 3   fluticasone -salmeterol (WIXELA INHUB) 250-50 MCG/ACT AEPB Inhale 1 puff into the lungs in the morning and at bedtime. 180 each 6   gabapentin (NEURONTIN) 100 MG capsule Take 100 mg by mouth 2 (two) times daily.     insulin  aspart (NOVOLOG ) 100 UNIT/ML injection Inject 5 Units into the skin 3 (three) times daily before meals.     insulin  degludec (TRESIBA FLEXTOUCH) 100 UNIT/ML FlexTouch Pen Inject 20 Units into the skin daily. 20 units once daily - REPLACES Levemir   megestrol  (MEGACE ) 40 MG tablet Take 1 tablet (40 mg total) by mouth daily. 30 tablet 2   montelukast  (SINGULAIR ) 10 MG tablet Take 10 mg by mouth at bedtime.     ondansetron  (ZOFRAN ) 8 MG tablet Take 1 tablet (8 mg total) by mouth every 8 (eight) hours as needed for nausea or vomiting. 30 tablet 0   ONETOUCH VERIO test strip 1 each by Other route 3 (three) times daily. 100 each 10   Tiotropium Bromide  Monohydrate (SPIRIVA  RESPIMAT) 2.5 MCG/ACT AERS Inhale 2 puffs into the lungs daily. 60 each 11   No current facility-administered medications on file prior to visit.     ROS see history of present illness  Objective  Physical Exam Vitals:   11/04/23 1118  BP: (!) 120/50  Pulse: 64  Temp: 98 F (36.7 C)  SpO2: 97%    BP Readings from Last 3 Encounters:  11/04/23 (!) 120/50  10/30/23 122/88  10/23/23 108/60   Wt Readings from Last 3 Encounters:  11/04/23 156 lb 3.2 oz (70.9 kg)  10/30/23 161 lb (73 kg)  10/23/23 155 lb (70.3 kg)    Physical Exam Constitutional:      General: She is not in acute distress.    Appearance: Normal appearance.  HENT:     Head: Normocephalic.  Cardiovascular:     Rate and Rhythm: Normal rate and regular rhythm.     Heart sounds: Normal heart sounds.  Pulmonary:     Effort: Pulmonary effort is normal.     Breath sounds: Normal breath sounds.  Skin:    General: Skin is warm and dry.  Neurological:     General: No focal deficit present.     Mental Status: She is alert.  Psychiatric:        Mood and Affect: Mood normal.        Behavior: Behavior normal.      Assessment/Plan: Please see individual problem list.  Type 2 diabetes with complication [facility]) Assessment & Plan: They manage with [place] and NovoLog . Blood glucose is under 200 mg/dL, and J8r is 3.8%. Ozempic  discontinued due to concerns about weight loss. Continue Tresiba and NovoLog  as prescribed, monitor blood glucose at home.   Hypertension associated with diabetes [facility]) Assessment & Plan: They are on norvasc , clonidine , losartan  hydrochlorothiazide , and toprol . Blood pressure is well controlled with occasional dizziness. Continue the antihypertensive regimen and recheck blood pressure before leaving.  Orders: -     amLODIPine  Besylate; Take 1 tablet (10 mg total) by mouth daily.  Dispense: 90 tablet; Refill: 3 -     Metoprolol  Succinate ER; Take 1 tablet (50 mg total)  by mouth daily.  Dispense: 90 tablet; Refill: 3 -     Losartan  Potassium-HCTZ; Take 1 tablet by mouth daily.  Dispense: 90 tablet; Refill: 3  Endometrial cancer [facility]) Assessment & Plan: They are undergoing their third cycle of chemotherapy. Treatment effectiveness and potential hospice care transition have been evaluated. Continue the chemotherapy regimen and follow up with oncology next week.   Centrilobular emphysema (HCC) Assessment & Plan: They use Wixela and Spiriva  daily, experiencing occasional dyspnea and cough, with rare albuterol  use. Continue Wixela and Spiriva , and use albuterol  as needed. Follow up with pulmonology.    Hyperlipidemia associated with type 2 diabetes mellitus (HCC) Assessment & Plan: Managed with Lipitor 80 mg daily. Continue.   Orders: -     Atorvastatin  Calcium ; Take 1 tablet (80 mg total) by mouth daily.  Dispense: 90 tablet;  Refill: 3  Mood disorder [facility]) Assessment & Plan: Their condition is well-managed with Wellbutrin  and Celexa , and mood is stable. Continue Wellbutrin  and Celexa .  Orders: -     buPROPion  HCl ER (XL); Take 1 tablet (300 mg total) by mouth daily.  Dispense: 90 tablet; Refill: 3 -     Citalopram  Hydrobromide; Take 1 tablet (20 mg total) by mouth daily.  Dispense: 90 tablet; Refill: 3  Iron  deficiency anemia, unspecified iron  deficiency anemia type Assessment & Plan: They take iron  supplements every other day but often forget. Daily supplementation is safe with chemotherapy. Allow daily iron  supplementation if preferred.  Orders: -     Iron  (Ferrous Sulfate ); Take 1 tablet by mouth daily.  Dispense: 90 tablet; Refill: 3     Return in about 3 months (around 02/04/2024) for Follow up.   Leron Glance, NP-C [facility]

## 2023-11-05 ENCOUNTER — Encounter: Payer: Self-pay | Admitting: *Deleted

## 2023-11-05 ENCOUNTER — Encounter: Payer: Self-pay | Admitting: Oncology

## 2023-11-05 NOTE — Telephone Encounter (Signed)
 Patient notified that Patient Assistance Medication are in the office & are ready for pick up.   Medication: Novolog  FlexPen 100 units/mL (U-100)  Quantity: 2 boxes  Lot# [id number]  Exp: 11/27/2025

## 2023-11-06 NOTE — Telephone Encounter (Signed)
 I called pt to notify her that her medication from June & July is still in the office and hasn't been picked up. Pt is no longer taking Ozempic .  Please remove this from her meds & log into PAR book.   Thanks

## 2023-11-07 ENCOUNTER — Ambulatory Visit: Admitting: Student in an Organized Health Care Education/Training Program

## 2023-11-07 NOTE — Telephone Encounter (Signed)
 Copied from CRM [id number]. Topic: General - Other >> Nov 06, 2023  5:04 PM Sophia H wrote: Reason for CRM: Spoke with patient who is calling in regarding medications ready for pick up, stated she does not drive and her friend is currently in the hospital. Mliss Clay will be picking up on her behalf next week.

## 2023-11-07 NOTE — Telephone Encounter (Signed)
logged

## 2023-11-07 NOTE — Telephone Encounter (Signed)
 noted

## 2023-11-11 ENCOUNTER — Encounter: Payer: Self-pay | Admitting: Nurse Practitioner

## 2023-11-11 NOTE — Assessment & Plan Note (Deleted)
 They take iron  supplements every other day but often forget. Daily supplementation is safe with chemotherapy. Allow daily iron  supplementation if preferred.

## 2023-11-11 NOTE — Assessment & Plan Note (Signed)
 They take iron  supplements every other day but often forget. Daily supplementation is safe with chemotherapy. Allow daily iron  supplementation if preferred.

## 2023-11-11 NOTE — Assessment & Plan Note (Signed)
 They use Wixela and Spiriva  daily, experiencing occasional dyspnea and cough, with rare albuterol  use. Continue Wixela and Spiriva , and use albuterol  as needed. Follow up with pulmonology.

## 2023-11-11 NOTE — Assessment & Plan Note (Signed)
 Their condition is well-managed with Wellbutrin  and Celexa , and mood is stable. Continue Wellbutrin  and Celexa .

## 2023-11-11 NOTE — Assessment & Plan Note (Signed)
 Managed with Lipitor 80 mg daily. Continue.

## 2023-11-11 NOTE — Assessment & Plan Note (Signed)
 They are on norvasc , clonidine , losartan  hydrochlorothiazide , and toprol . Blood pressure is well controlled with occasional dizziness. Continue the antihypertensive regimen and recheck blood pressure before leaving.

## 2023-11-11 NOTE — Assessment & Plan Note (Signed)
 They are undergoing their third cycle of chemotherapy. Treatment effectiveness and potential hospice care transition have been evaluated. Continue the chemotherapy regimen and follow up with oncology next week.

## 2023-11-11 NOTE — Assessment & Plan Note (Signed)
 They manage with [place] and NovoLog . Blood glucose is under 200 mg/dL, and J8r is 3.8%. Ozempic  discontinued due to concerns about weight loss. Continue Tresiba and NovoLog  as prescribed, monitor blood glucose at home.

## 2023-11-12 ENCOUNTER — Other Ambulatory Visit: Payer: Self-pay | Admitting: *Deleted

## 2023-11-12 DIAGNOSIS — C541 Malignant neoplasm of endometrium: Secondary | ICD-10-CM

## 2023-11-13 ENCOUNTER — Encounter: Payer: Self-pay | Admitting: Oncology

## 2023-11-13 ENCOUNTER — Inpatient Hospital Stay

## 2023-11-13 ENCOUNTER — Inpatient Hospital Stay (HOSPITAL_BASED_OUTPATIENT_CLINIC_OR_DEPARTMENT_OTHER): Admitting: Oncology

## 2023-11-13 VITALS — BP 105/48 | HR 58 | Temp 97.3°F | Resp 18 | Ht 62.0 in | Wt 161.0 lb

## 2023-11-13 VITALS — BP 128/55 | HR 55

## 2023-11-13 DIAGNOSIS — C541 Malignant neoplasm of endometrium: Secondary | ICD-10-CM

## 2023-11-13 DIAGNOSIS — Z5111 Encounter for antineoplastic chemotherapy: Secondary | ICD-10-CM | POA: Diagnosis not present

## 2023-11-13 LAB — CMP (CANCER CENTER ONLY)
ALT: 11 U/L (ref 0–44)
AST: 15 U/L (ref 15–41)
Albumin: 2.8 g/dL — ABNORMAL LOW (ref 3.5–5.0)
Alkaline Phosphatase: 68 U/L (ref 38–126)
Anion gap: 9 (ref 5–15)
BUN: 23 mg/dL (ref 8–23)
CO2: 29 mmol/L (ref 22–32)
Calcium: 8 mg/dL — ABNORMAL LOW (ref 8.9–10.3)
Chloride: 99 mmol/L (ref 98–111)
Creatinine: 1.18 mg/dL — ABNORMAL HIGH (ref 0.44–1.00)
GFR, Estimated: 49 mL/min — ABNORMAL LOW (ref >60)
Glucose, Bld: 227 mg/dL — ABNORMAL HIGH (ref 70–99)
Potassium: 3.2 mmol/L — ABNORMAL LOW (ref 3.5–5.1)
Sodium: 137 mmol/L (ref 135–145)
Total Bilirubin: 0.9 mg/dL (ref 0.0–1.2)
Total Protein: 5.3 g/dL — ABNORMAL LOW (ref 6.5–8.1)

## 2023-11-13 LAB — CBC WITH DIFFERENTIAL (CANCER CENTER ONLY)
Abs Immature Granulocytes: 0.04 K/uL (ref 0.00–0.07)
Basophils Absolute: 0 K/uL (ref 0.0–0.1)
Basophils Relative: 1 %
Eosinophils Absolute: 0 K/uL (ref 0.0–0.5)
Eosinophils Relative: 1 %
HCT: 23.8 % — ABNORMAL LOW (ref 36.0–46.0)
Hemoglobin: 7.3 g/dL — ABNORMAL LOW (ref 12.0–15.0)
Immature Granulocytes: 1 %
Lymphocytes Relative: 7 %
Lymphs Abs: 0.2 K/uL — ABNORMAL LOW (ref 0.7–4.0)
MCH: 26.6 pg (ref 26.0–34.0)
MCHC: 30.7 g/dL (ref 30.0–36.0)
MCV: 86.9 fL (ref 80.0–100.0)
Monocytes Absolute: 0.5 K/uL (ref 0.1–1.0)
Monocytes Relative: 16 %
Neutro Abs: 2.3 K/uL (ref 1.7–7.7)
Neutrophils Relative %: 74 %
Platelet Count: 255 K/uL (ref 150–400)
RBC: 2.74 MIL/uL — ABNORMAL LOW (ref 3.87–5.11)
RDW: 16.5 % — ABNORMAL HIGH (ref 11.5–15.5)
WBC Count: 3.2 K/uL — ABNORMAL LOW (ref 4.0–10.5)
nRBC: 0 % (ref 0.0–0.2)

## 2023-11-13 LAB — PREPARE RBC (CROSSMATCH)

## 2023-11-13 MED ORDER — SODIUM CHLORIDE 0.9 % IV SOLN
INTRAVENOUS | Status: DC
  Filled 2023-11-13: qty 250

## 2023-11-13 MED ORDER — HEPARIN SOD (PORK) LOCK FLUSH 100 UNIT/ML IV SOLN
500.0000 [IU] | Freq: Once | INTRAVENOUS | Status: AC | PRN
  Administered 2023-11-13: 500 [IU]
  Filled 2023-11-13: qty 5

## 2023-11-13 MED ORDER — DEXAMETHASONE SODIUM PHOSPHATE 10 MG/ML IJ SOLN
10.0000 mg | Freq: Once | INTRAMUSCULAR | Status: AC
  Administered 2023-11-13: 10 mg via INTRAVENOUS
  Filled 2023-11-13: qty 1

## 2023-11-13 MED ORDER — DIPHENHYDRAMINE HCL 50 MG/ML IJ SOLN
25.0000 mg | Freq: Once | INTRAMUSCULAR | Status: AC
  Administered 2023-11-13: 25 mg via INTRAVENOUS
  Filled 2023-11-13: qty 1

## 2023-11-13 MED ORDER — SODIUM CHLORIDE 0.9 % IV SOLN
80.0000 mg/m2 | Freq: Once | INTRAVENOUS | Status: AC
  Administered 2023-11-13: 150 mg via INTRAVENOUS
  Filled 2023-11-13: qty 25

## 2023-11-13 MED ORDER — SODIUM CHLORIDE 0.9% FLUSH
10.0000 mL | INTRAVENOUS | Status: DC | PRN
  Administered 2023-11-13: 10 mL
  Filled 2023-11-13: qty 10

## 2023-11-13 MED ORDER — FAMOTIDINE IN NACL 20-0.9 MG/50ML-% IV SOLN
20.0000 mg | Freq: Once | INTRAVENOUS | Status: AC
  Administered 2023-11-13: 20 mg via INTRAVENOUS
  Filled 2023-11-13: qty 50

## 2023-11-13 NOTE — Progress Notes (Signed)
 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  INTERVAL HISTORY: Patient returns to clinic today for further evaluation and consideration of cycle 5, day 1 of single agent Taxol .  She once again had 1 fall yesterday, but otherwise has felt well.  Continues to have a chronic peripheral neuropathy, but no other neurologic complaints.  She does not complain of any weakness or fatigue. Her vaginal bleeding has improved, but still evident.  She denies any recent fevers or illnesses.  She has no chest pain, shortness of breath, cough, or hemoptysis.  She denies any nausea, vomiting, constipation, or diarrhea.  She has no urinary complaints.  Patient offers no further specific complaints today.  REVIEW OF SYSTEMS:   Review of Systems  Constitutional:  Positive for malaise/fatigue. Negative for fever and weight loss.  Respiratory: Negative.  Negative for cough, hemoptysis and shortness of breath.   Cardiovascular: Negative.  Negative for chest pain and leg swelling.  Gastrointestinal: Negative.  Negative for abdominal pain, blood in stool and melena.  Genitourinary: Negative.  Negative for dysuria.  Musculoskeletal:  Positive for falls. Negative for back pain.  Skin: Negative.  Negative for rash.  Neurological:  Positive for sensory change and weakness. Negative for dizziness, focal weakness and headaches.  Psychiatric/Behavioral: Negative.  The patient is not nervous/anxious.      As per HPI. Otherwise, a complete review of systems is negative.  PAST MEDICAL HISTORY: Past Medical History:  Diagnosis Date   Adenomatous colon polyp    Aortic stenosis    Arthritis    Asthma    Cancer (HCC)    Claudication (HCC)    COPD (chronic obstructive pulmonary disease) (HCC)    Depression    Diabetes mellitus without complication (HCC)    Esophageal dysphagia    GERD (gastroesophageal reflux disease)    Hyperlipemia    Hypertension    Obesity    Severe obesity (BMI 35.0-35.9 with comorbidity) (HCC)     PAST SURGICAL HISTORY: Past Surgical History:  Procedure Laterality Date   BLADDER SURGERY     COLONOSCOPY WITH PROPOFOL  N/A 08/05/2015   Procedure: COLONOSCOPY WITH PROPOFOL ;  Surgeon: Donnice Vaughn Manes, MD;  Location[facility] ENDOSCOPY;  Service: Endoscopy;  Laterality: N/A;   ESOPHAGOGASTRODUODENOSCOPY N/A 01/09/2022   Procedure: ESOPHAGOGASTRODUODENOSCOPY (EGD);  Surgeon: Maryruth Ole DASEN, MD;  Location: [facility] ENDOSCOPY;  Service: Endoscopy;  Laterality: N/A;   FOOT SURGERY Left    FRACTURE SURGERY     IR CV LINE INJECTION  06/22/2022   IR IMAGING GUIDED PORT INSERTION  06/05/2022   IR PORT REPAIR CENTRAL VENOUS ACCESS DEVICE  07/02/2022   LOWER EXTREMITY ANGIOGRAPHY Left 02/04/2023   Procedure: Lower Extremity Angiography;  Surgeon: Marea Selinda RAMAN, MD;  Location: [facility] INVASIVE CV LAB;  Service: Cardiovascular;  Laterality: Left;   ORIF ANKLE FRACTURE Left 03/26/2017   Procedure: OPEN REDUCTION INTERNAL FIXATION (ORIF) ANKLE FRACTURE;  Surgeon: Edie Norleen PARAS, MD;  Location: [facility] ORS;  Service: Orthopedics;  Laterality: Left;    FAMILY HISTORY: Family History  Problem Relation Age of Onset   CVA  Mother    Diabetes Mother    CAD Father    Breast cancer Neg Hx     ADVANCED DIRECTIVES (Y/N):  N  HEALTH MAINTENANCE: Social History   Tobacco Use   Smoking status: Former    Current packs/day: 1.00    Average packs/day: 1 pack/day for 40.0 years (40.0 ttl  pk-yrs)    Types: Cigarettes   Smokeless tobacco: Never   Tobacco comments:    Quite 2015  Vaping Use   Vaping status: Never Used  Substance Use Topics   Alcohol use: No   Drug use: No     Colonoscopy:  PAP:  Bone density:  Lipid panel:  No Known Allergies  Current Outpatient Medications  Medication Sig Dispense Refill   albuterol  (VENTOLIN  HFA) 108 (90 Base) MCG/ACT inhaler Inhale 2 puffs into the lungs every 6 (six) hours as needed for wheezing or shortness of breath. 1 each 1   amLODipine  (NORVASC ) 10 MG tablet Take 1 tablet (10 mg total) by mouth daily. 90 tablet 3   aspirin  EC 81 MG tablet Take 1 tablet (81 mg total) by mouth daily. Swallow whole. 150 tablet 2   atorvastatin  (LIPITOR) 80 MG tablet Take 1 tablet (80 mg total) by mouth daily. 90 tablet 3   buPROPion  (WELLBUTRIN  XL) 300 MG 24 hr tablet Take 1 tablet (300 mg total) by mouth daily. 90 tablet 3   Cholecalciferol  (VITAMIN D3) 50 MCG (2000 UT) capsule Take 2,000 Units by mouth daily.     citalopram  (CELEXA ) 20 MG tablet Take 1 tablet (20 mg total) by mouth daily. 90 tablet 3   cloNIDine  (CATAPRES ) 0.2 MG tablet Take 0.2 mg by mouth 2 (two) times daily.     clopidogrel  (PLAVIX ) 75 MG tablet Take 1 tablet (75 mg total) by mouth daily. 90 tablet 3   cyanocobalamin  (VITAMIN B12) 1000 MCG tablet Take 1 tablet (1,000 mcg total) by mouth daily. 90 tablet 3   fluticasone -salmeterol (WIXELA INHUB) 250-50 MCG/ACT AEPB Inhale 1 puff into the lungs in the morning and at bedtime. 180 each 6   gabapentin (NEURONTIN) 100 MG capsule Take 100 mg by mouth 2 (two) times daily.     insulin  aspart (NOVOLOG ) 100 UNIT/ML injection Inject 5 Units into the skin 3 (three) times daily before meals.     insulin  degludec (TRESIBA FLEXTOUCH) 100 UNIT/ML FlexTouch Pen Inject 20 Units into the skin daily. 20 units once daily - REPLACES Levemir      Iron , Ferrous Sulfate , 325 (65 Fe) MG TABS Take 1 tablet by mouth daily. 90 tablet 3    losartan -hydrochlorothiazide  (HYZAAR) 100-25 MG tablet Take 1 tablet by mouth daily. 90 tablet 3   megestrol  (MEGACE ) 40 MG tablet Take 1 tablet (40 mg total) by mouth daily. 30 tablet 2   metoprolol  succinate (TOPROL -XL) 50 MG 24 hr tablet Take 1 tablet (50 mg total) by mouth daily. 90 tablet 3   montelukast  (SINGULAIR ) 10 MG tablet Take 10 mg by mouth at bedtime.     ondansetron  (ZOFRAN ) 8 MG tablet Take 1 tablet (8 mg total) by mouth every 8 (eight) hours as needed for nausea or vomiting. 30 tablet 0   ONETOUCH VERIO test strip 1 each by Other route 3 (three) times daily. 100 each 10   Tiotropium Bromide Monohydrate  (SPIRIVA  RESPIMAT) 2.5 MCG/ACT AERS Inhale 2 puffs into the lungs daily. 60 each 11   No current facility-administered medications for this visit.    OBJECTIVE: Vitals:   11/13/23  1041  BP: (!) 105/48  Pulse: (!) 58  Resp: 18  Temp: (!) 97.3 F (36.3 C)  SpO2: 98%       Body mass index is 29.45 kg/m.    ECOG FS:1 - Symptomatic but completely ambulatory  General: Well-developed, well-nourished, no acute distress.  Sitting in a wheelchair. Eyes: Pink conjunctiva, anicteric sclera. HEENT: Normocephalic, moist mucous membranes. Lungs: No audible wheezing or coughing. Heart: Regular rate and rhythm. Abdomen: Soft, nontender, no obvious distention. Musculoskeletal: No edema, cyanosis, or clubbing. Neuro: Alert, answering all questions appropriately. Cranial nerves grossly intact. Skin: No rashes or petechiae noted. Psych: Normal affect.  LAB RESULTS:  Lab Results  Component Value Date   NA 137 11/13/2023   K 3.2 (L) 11/13/2023   CL 99 11/13/2023   CO2 29 11/13/2023   GLUCOSE 227 (H) 11/13/2023   BUN 23 11/13/2023   CREATININE 1.18 (H) 11/13/2023   CALCIUM  8.0 (L) 11/13/2023   PROT 5.3 (L) 11/13/2023   ALBUMIN 2.8 (L) 11/13/2023   AST 15 11/13/2023   ALT 11 11/13/2023   ALKPHOS 68 11/13/2023   BILITOT 0.9 11/13/2023   GFRNONAA 49 (L) 11/13/2023   GFRAA  >60 03/27/2017    Lab Results  Component Value Date   WBC 3.2 (L) 11/13/2023   NEUTROABS 2.3 11/13/2023   HGB 7.3 (L) 11/13/2023   HCT 23.8 (L) 11/13/2023   MCV 86.9 11/13/2023   PLT 255 11/13/2023     STUDIES: NM PET Image Restag (PS) Skull Base To Thigh Result Date: 10/15/2023 CLINICAL DATA:  Subsequent treatment strategy for endometrial carcinoma. EXAM: NUCLEAR MEDICINE PET SKULL BASE TO THIGH TECHNIQUE: 9.5 mCi F-18 FDG was injected intravenously. Full-ring PET imaging was performed from the skull base to thigh after the radiotracer. CT data was obtained and used for attenuation correction and anatomic localization. Fasting blood glucose: 111 mg/dl COMPARISON:  FDG PET scan 06/14/2023 FINDINGS: Choose one NECK: No hypermetabolic lymph nodes in the neck. Incidental CT findings: None. CHEST: No hypermetabolic mediastinal or hilar nodes. No suspicious pulmonary nodules on the CT scan. Incidental CT findings: Port in the anterior chest wall with tip in distal SVC. ABDOMEN/PELVIS: Metabolic activity again demonstrated centrally within the uterus with SUV max 11.8 compared SUV max equal 16.5 4A reduction activity. No radiotracer avid pelvic lymph nodes. No retroperitoneal nodes. No liver metastasis. Resolution of previously hypermetabolic RIGHT common iliac lymph node. Incidental CT findings: None. SKELETON: Metabolic activity associated with a healed RIGHT rib fracture on image 56. favor benign posttraumatic findings. Multiple additional healed rib fractures on the RIGHT. Potential re-injury of this rib Incidental CT findings: None. IMPRESSION: 1. Decreased metabolic activity within the endometrium. 2. Resolution RIGHT common iliac lymph node metabolic activity. 3. No hypermetabolic pelvic or abdominal lymph nodes 4. No distant metastatic disease. Electronically Signed   By: Jackquline Boxer M.D.   On: 10/15/2023 14:11      ONCOLOGY HISTORY: Patient was initially hesitant to undergo chemotherapy  and elected to do XRT only which was completed on April 18, 2022.  CT scan results from October 08, 2022 reviewed independently with no obvious evidence of progressive disease. She completed 6 cycles of carboplatinum, Taxol , and Keytruda  on October 03, 2022.  Patient then initiated maintenance Keytruda  on October 23, 2022.  PET scan results from January 15, 2023 reviewed independently with mild nonspecific residual hypermetabolism in the lower uterine segment as well as small bilateral common iliac nodes possibly suggesting nodal metastasis.  Patient was seen by  gynecology oncology who determined surgical intervention is not an option.  They recommend discontinuing Keytruda  for progressive disease and initiating single agent Doxil  every 28 days.  Patient received 3 cycles of Doxil  before progression of disease her last dose was given on June 05, 2023.  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  Repeat PET scan on October 15, 2023 reviewed independently with significant improvement of disease burden.  Internal exam by gynecology oncology also confirmed disease improvement.  Proceed with cycle 5, day 1 of single agent Taxol  today.  Return to clinic tomorrow for 1 unit of packed red blood cells and then in 1 week for further evaluation and consideration of cycle 5, day 8.  Plan to reimage after the conclusion of cycle 6.  If residual disease at that time, can consider internal brachytherapy at [facility].   Port: Port revision successful.  Proceed with treatment as above. Hyperglycemia: Chronic and unchanged. Hypokalemia: Chronic and unchanged. Renal insufficiency: Mild, monitor. Leukopenia: ANC is now normal.  Proceed with treatment as above.  Anemia: Hemoglobin send on 7.3.  Return to clinic tomorrow for 1 unit packed red blood cell.   Thrombocytopenia:  Resolved. Vaginal bleeding: Improved.  Continue follow-up with gynecology oncology as needed.   Claudication symptoms: Resolved.  Patient underwent revascularization procedure on February 04, 2023. Right hamstring tendon rupture: Patient reports there is no plan for surgical repair. Rash: Resolved.  Likely secondary to Doxil  which has been discontinued.   Poor appetite/weight loss: Continue Megace  as prescribed.  Patient refuses referral to dietary.   Neuropathy/dizziness: Chronic and unchanged.  Continue follow-up with neurology as recommended.   Falls: Intermittent.  Patient now has 2 walkers at her home upstairs and downstairs.  She admits to only using them occasionally.   Patient expressed understanding and was in agreement with this plan. She also understands that She can call clinic at any time with any questions, concerns, or complaints.    Cancer Staging  Endometrial cancer [facility]) Staging form: Corpus Uteri - Carcinoma and Carcinosarcoma, AJCC 8th Edition - Clinical stage from 05/30/2022: FIGO Stage IIIB (cT3b, cN0, cM0) - Signed by Jacobo Evalene PARAS, MD on 05/30/2022 Stage prefix: Initial diagnosis   Evalene PARAS Jacobo, MD   11/13/2023 10:53 AM

## 2023-11-13 NOTE — Progress Notes (Signed)
 Patient's pulse was lower than it has been in a long time. She says that she is feeling ok.

## 2023-11-13 NOTE — Progress Notes (Signed)
 Ok per MD proceed with chemo with hgb  7.3

## 2023-11-13 NOTE — Patient Instructions (Signed)
 [facility] - A DEPT OF [facility]  Discharge Instructions: Thank you for choosing [facility] to provide your oncology and hematology care.  If you have a lab appointment with the Cancer Center, please go directly to the Cancer Center and check in at the registration area.  Wear comfortable clothing and clothing appropriate for easy access to any Portacath or PICC line.   We strive to give you quality time with your provider. You may need to reschedule your appointment if you arrive late (15 or more minutes).  Arriving late affects you and other patients whose appointments are after yours.  Also, if you miss three or more appointments without notifying the office, you may be dismissed from the clinic at the provider's discretion.      For prescription refill requests, have your pharmacy contact our office and allow 72 hours for refills to be completed.    Today you received the following chemotherapy and/or immunotherapy agents : PACLitaxel       To help prevent nausea and vomiting after your treatment, we encourage you to take your nausea medication as directed.  BELOW ARE SYMPTOMS THAT SHOULD BE REPORTED IMMEDIATELY: *FEVER GREATER THAN 100.4 F (38 C) OR HIGHER *CHILLS OR SWEATING *NAUSEA AND VOMITING THAT IS NOT CONTROLLED WITH YOUR NAUSEA MEDICATION *UNUSUAL SHORTNESS OF BREATH *UNUSUAL BRUISING OR BLEEDING *URINARY PROBLEMS (pain or burning when urinating, or frequent urination) *BOWEL PROBLEMS (unusual diarrhea, constipation, pain near the anus) TENDERNESS IN MOUTH AND THROAT WITH OR WITHOUT PRESENCE OF ULCERS (sore throat, sores in mouth, or a toothache) UNUSUAL RASH, SWELLING OR PAIN  UNUSUAL VAGINAL DISCHARGE OR ITCHING   Items with * indicate a potential emergency and should be followed up as soon as possible or go to the Emergency Department if any problems should occur.  Please show the CHEMOTHERAPY ALERT CARD or IMMUNOTHERAPY  ALERT CARD at check-in to the Emergency Department and triage nurse.  Should you have questions after your visit or need to cancel or reschedule your appointment, please contact [facility] - A DEPT OF [facility]  [phone] and follow the prompts.  Office hours are 8:00 a.m. to 4:30 p.m. Monday - Friday. Please note that voicemails left after 4:00 p.m. may not be returned until the following business day.  We are closed weekends and major holidays. You have access to a nurse at all times for urgent questions. Please call the main number to the clinic [phone] and follow the prompts.  For any non-urgent questions, you may also contact your provider using MyChart. We now offer e-Visits for anyone 45 and older to request care online for non-urgent symptoms. For details visit [email or web address].   Also download the MyChart app! Go to the app store, search "MyChart", open the app, select [place], and log in with your MyChart username and password.

## 2023-11-14 ENCOUNTER — Inpatient Hospital Stay

## 2023-11-14 DIAGNOSIS — C541 Malignant neoplasm of endometrium: Secondary | ICD-10-CM

## 2023-11-14 DIAGNOSIS — Z5111 Encounter for antineoplastic chemotherapy: Secondary | ICD-10-CM | POA: Diagnosis not present

## 2023-11-14 MED ORDER — DIPHENHYDRAMINE HCL 50 MG/ML IJ SOLN
25.0000 mg | Freq: Once | INTRAMUSCULAR | Status: AC
  Administered 2023-11-14: 25 mg via INTRAVENOUS
  Filled 2023-11-14: qty 1

## 2023-11-14 MED ORDER — ACETAMINOPHEN 325 MG PO TABS
650.0000 mg | ORAL_TABLET | Freq: Once | ORAL | Status: AC
  Administered 2023-11-14: 650 mg via ORAL
  Filled 2023-11-14: qty 2

## 2023-11-14 MED ORDER — SODIUM CHLORIDE 0.9% IV SOLUTION
250.0000 mL | INTRAVENOUS | Status: DC
  Administered 2023-11-14: 100 mL via INTRAVENOUS
  Filled 2023-11-14: qty 250

## 2023-11-14 MED ORDER — HEPARIN SOD (PORK) LOCK FLUSH 100 UNIT/ML IV SOLN
500.0000 [IU] | Freq: Once | INTRAVENOUS | Status: AC
  Administered 2023-11-14: 500 [IU] via INTRAVENOUS
  Filled 2023-11-14: qty 5

## 2023-11-15 LAB — TYPE AND SCREEN
ABO/RH(D): A POS
Antibody Screen: NEGATIVE
Unit division: 0

## 2023-11-15 LAB — BPAM RBC
Blood Product Expiration Date: 202508172359
ISSUE DATE / TIME: 202507171344
Unit Type and Rh: 6200

## 2023-11-15 NOTE — Telephone Encounter (Signed)
 Copied from CRM [id number]. Topic: General - Other >> Nov 14, 2023  4:59 PM Terri [name] wrote: Reason for CRM: Pt will be there next week to pick up her medication from the Practice to pick her medications

## 2023-11-19 ENCOUNTER — Telehealth: Payer: Self-pay | Admitting: *Deleted

## 2023-11-19 NOTE — Telephone Encounter (Signed)
 Copied from CRM [id number]. Topic: General - Other >> Nov 14, 2023  4:59 PM Harlene ORN wrote: Reason for CRM: Pt will be there next week to pick up her medication from the Practice to pick her medications >> Nov 19, 2023 12:33 PM Mia F wrote: Mliss (pt friend) will be picking up the medication that pt has at the office either today or tomorrow. It ws confirmed with office that it is still there for pick up

## 2023-11-20 ENCOUNTER — Inpatient Hospital Stay

## 2023-11-20 ENCOUNTER — Inpatient Hospital Stay (HOSPITAL_BASED_OUTPATIENT_CLINIC_OR_DEPARTMENT_OTHER): Admitting: Nurse Practitioner

## 2023-11-20 ENCOUNTER — Telehealth: Payer: Self-pay

## 2023-11-20 ENCOUNTER — Ambulatory Visit: Admitting: Oncology

## 2023-11-20 DIAGNOSIS — C541 Malignant neoplasm of endometrium: Secondary | ICD-10-CM

## 2023-11-20 NOTE — Telephone Encounter (Signed)
 error

## 2023-11-20 NOTE — Progress Notes (Signed)
 Patient cancelled appointment due to illness. Declines [facility]. Treatment deferred to next week. Patient not seen. No charge.

## 2023-11-20 NOTE — Telephone Encounter (Signed)
 Patient picked up her patient assistance medications.

## 2023-11-22 NOTE — Telephone Encounter (Signed)
 Patient picked up her patient assistance medications.

## 2023-11-26 ENCOUNTER — Ambulatory Visit

## 2023-11-27 ENCOUNTER — Ambulatory Visit

## 2023-11-27 ENCOUNTER — Inpatient Hospital Stay

## 2023-11-27 ENCOUNTER — Ambulatory Visit: Admitting: Oncology

## 2023-11-27 ENCOUNTER — Other Ambulatory Visit

## 2023-11-27 ENCOUNTER — Encounter: Payer: Self-pay | Admitting: Oncology

## 2023-11-27 ENCOUNTER — Encounter

## 2023-11-27 ENCOUNTER — Inpatient Hospital Stay (HOSPITAL_BASED_OUTPATIENT_CLINIC_OR_DEPARTMENT_OTHER): Admitting: Oncology

## 2023-11-27 VITALS — BP 165/67 | HR 60 | Temp 96.8°F | Resp 17

## 2023-11-27 VITALS — BP 112/60 | HR 58 | Temp 97.8°F | Resp 16 | Wt 160.0 lb

## 2023-11-27 DIAGNOSIS — C541 Malignant neoplasm of endometrium: Secondary | ICD-10-CM

## 2023-11-27 DIAGNOSIS — Z5111 Encounter for antineoplastic chemotherapy: Secondary | ICD-10-CM | POA: Diagnosis not present

## 2023-11-27 LAB — CBC WITH DIFFERENTIAL (CANCER CENTER ONLY)
Abs Immature Granulocytes: 0.03 K/uL (ref 0.00–0.07)
Basophils Absolute: 0 K/uL (ref 0.0–0.1)
Basophils Relative: 0 %
Eosinophils Absolute: 0.3 K/uL (ref 0.0–0.5)
Eosinophils Relative: 6 %
HCT: 28.4 % — ABNORMAL LOW (ref 36.0–46.0)
Hemoglobin: 9 g/dL — ABNORMAL LOW (ref 12.0–15.0)
Immature Granulocytes: 1 %
Lymphocytes Relative: 7 %
Lymphs Abs: 0.4 K/uL — ABNORMAL LOW (ref 0.7–4.0)
MCH: 26.4 pg (ref 26.0–34.0)
MCHC: 31.7 g/dL (ref 30.0–36.0)
MCV: 83.3 fL (ref 80.0–100.0)
Monocytes Absolute: 0.6 K/uL (ref 0.1–1.0)
Monocytes Relative: 13 %
Neutro Abs: 3.5 K/uL (ref 1.7–7.7)
Neutrophils Relative %: 73 %
Platelet Count: 250 K/uL (ref 150–400)
RBC: 3.41 MIL/uL — ABNORMAL LOW (ref 3.87–5.11)
RDW: 16.8 % — ABNORMAL HIGH (ref 11.5–15.5)
WBC Count: 4.9 K/uL (ref 4.0–10.5)
nRBC: 0 % (ref 0.0–0.2)

## 2023-11-27 LAB — CMP (CANCER CENTER ONLY)
ALT: 10 U/L (ref 0–44)
AST: 14 U/L — ABNORMAL LOW (ref 15–41)
Albumin: 2.9 g/dL — ABNORMAL LOW (ref 3.5–5.0)
Alkaline Phosphatase: 81 U/L (ref 38–126)
Anion gap: 8 (ref 5–15)
BUN: 40 mg/dL — ABNORMAL HIGH (ref 8–23)
CO2: 28 mmol/L (ref 22–32)
Calcium: 8.6 mg/dL — ABNORMAL LOW (ref 8.9–10.3)
Chloride: 99 mmol/L (ref 98–111)
Creatinine: 1.33 mg/dL — ABNORMAL HIGH (ref 0.44–1.00)
GFR, Estimated: 42 mL/min — ABNORMAL LOW (ref >60)
Glucose, Bld: 277 mg/dL — ABNORMAL HIGH (ref 70–99)
Potassium: 3.8 mmol/L (ref 3.5–5.1)
Sodium: 135 mmol/L (ref 135–145)
Total Bilirubin: 0.7 mg/dL (ref 0.0–1.2)
Total Protein: 5.8 g/dL — ABNORMAL LOW (ref 6.5–8.1)

## 2023-11-27 MED ORDER — DIPHENHYDRAMINE HCL 50 MG/ML IJ SOLN
25.0000 mg | Freq: Once | INTRAMUSCULAR | Status: AC
  Administered 2023-11-27: 25 mg via INTRAVENOUS
  Filled 2023-11-27: qty 1

## 2023-11-27 MED ORDER — SODIUM CHLORIDE 0.9 % IV SOLN
80.0000 mg/m2 | Freq: Once | INTRAVENOUS | Status: AC
  Administered 2023-11-27: 150 mg via INTRAVENOUS
  Filled 2023-11-27: qty 25

## 2023-11-27 MED ORDER — FAMOTIDINE IN NACL 20-0.9 MG/50ML-% IV SOLN
20.0000 mg | Freq: Once | INTRAVENOUS | Status: AC
  Administered 2023-11-27: 20 mg via INTRAVENOUS
  Filled 2023-11-27: qty 50

## 2023-11-27 MED ORDER — DEXAMETHASONE SODIUM PHOSPHATE 10 MG/ML IJ SOLN
10.0000 mg | Freq: Once | INTRAMUSCULAR | Status: AC
  Administered 2023-11-27: 10 mg via INTRAVENOUS
  Filled 2023-11-27: qty 1

## 2023-11-27 MED ORDER — HEPARIN SOD (PORK) LOCK FLUSH 100 UNIT/ML IV SOLN
500.0000 [IU] | Freq: Once | INTRAVENOUS | Status: AC | PRN
Start: 2023-11-27 — End: 2023-11-27
  Administered 2023-11-27: 500 [IU]
  Filled 2023-11-27: qty 5

## 2023-11-27 MED ORDER — SODIUM CHLORIDE 0.9 % IV SOLN
INTRAVENOUS | Status: DC
  Filled 2023-11-27: qty 250

## 2023-11-27 NOTE — Progress Notes (Unsigned)
 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  INTERVAL HISTORY: Patient returns to clinic today for further evaluation and reconsideration of cycle 5, day 8 of single agent Taxol .  She missed her treatment last week secondary to increased diarrhea which is since resolved.  She does not report any falls this week.  She currently feels well.  Continues to have a chronic peripheral neuropathy, but no other neurologic complaints.  She does not complain of any weakness or fatigue. Her vaginal bleeding has improved, but still evident.  She denies any recent fevers or illnesses.  She has no chest pain, shortness of breath, cough, or hemoptysis.  She denies any nausea, vomiting, constipation, or diarrhea.  She has no urinary complaints.  Patient offers no further specific complaints today.  REVIEW OF SYSTEMS:   Review of Systems  Constitutional:  Positive for malaise/fatigue. Negative for fever and weight loss.  Respiratory: Negative.  Negative for cough, hemoptysis and shortness of breath.   Cardiovascular: Negative.  Negative for chest pain and leg swelling.  Gastrointestinal: Negative.  Negative for abdominal pain, blood in stool and melena.  Genitourinary: Negative.  Negative for dysuria.  Musculoskeletal:  Positive for falls. Negative for back pain.  Skin: Negative.  Negative for rash.  Neurological:  Positive for sensory change and weakness. Negative for dizziness, focal  weakness and headaches.  Psychiatric/Behavioral: Negative.  The patient is not nervous/anxious.     As per HPI. Otherwise, a complete review of systems is negative.  PAST MEDICAL HISTORY: Past Medical History:  Diagnosis Date   Adenomatous colon polyp    Aortic stenosis    Arthritis    Asthma    Cancer (HCC)    Claudication (HCC)    COPD (chronic obstructive pulmonary disease) (HCC)    Depression    Diabetes mellitus without complication (HCC)    Esophageal dysphagia    GERD (gastroesophageal reflux disease)    Hyperlipemia    Hypertension    Obesity    Severe obesity (BMI 35.0-35.9 with comorbidity) (HCC)     PAST SURGICAL HISTORY: Past Surgical History:  Procedure Laterality Date   BLADDER SURGERY     COLONOSCOPY WITH PROPOFOL  N/A 08/05/2015   Procedure: COLONOSCOPY WITH PROPOFOL ;  Surgeon: Donnice Vaughn Manes, MD;  Location: [facility] ENDOSCOPY;  Service: Endoscopy;  Laterality: N/A;   ESOPHAGOGASTRODUODENOSCOPY N/A 01/09/2022   Procedure: ESOPHAGOGASTRODUODENOSCOPY (EGD);  Surgeon: Maryruth Ole DASEN, MD;  Location: [facility] ENDOSCOPY;  Service: Endoscopy;  Laterality: N/A;   FOOT SURGERY Left    FRACTURE SURGERY     IR CV LINE INJECTION  06/22/2022   IR IMAGING GUIDED PORT INSERTION  06/05/2022   IR PORT REPAIR CENTRAL VENOUS ACCESS DEVICE  07/02/2022   LOWER EXTREMITY ANGIOGRAPHY Left 02/04/2023   Procedure: Lower Extremity Angiography;  Surgeon: Marea Selinda RAMAN, MD;  Location: [facility] INVASIVE CV LAB;  Service: Cardiovascular;  Laterality: Left;   ORIF ANKLE FRACTURE Left 03/26/2017   Procedure: OPEN REDUCTION INTERNAL FIXATION (ORIF) ANKLE FRACTURE;  Surgeon: Edie Norleen PARAS, MD;  Location: [facility] ORS;  Service: Orthopedics;  Laterality: Left;  FAMILY HISTORY: Family History  Problem Relation Age of Onset   CVA Mother    Diabetes Mother    CAD Father    Breast cancer Neg Hx     ADVANCED DIRECTIVES (Y/N):  N  HEALTH MAINTENANCE: Social History   Tobacco Use   Smoking  status: Former    Current packs/day: 1.00    Average packs/day: 1 pack/day for 40.0 years (40.0 ttl pk-yrs)    Types: Cigarettes   Smokeless tobacco: Never   Tobacco comments:    Quite 2015  Vaping Use   Vaping status: Never Used  Substance Use Topics   Alcohol use: No   Drug use: No     Colonoscopy:  PAP:  Bone density:  Lipid panel:  No Known Allergies  Current Outpatient Medications  Medication Sig Dispense Refill   albuterol  (VENTOLIN  HFA) 108 (90 Base) MCG/ACT inhaler Inhale 2 puffs into the lungs every 6 (six) hours as needed for wheezing or shortness of breath. 1 each 1   amLODipine  (NORVASC ) 10 MG tablet Take 1 tablet (10 mg total) by mouth daily. 90 tablet 3   aspirin  EC 81 MG tablet Take 1 tablet (81 mg total) by mouth daily. Swallow whole. 150 tablet 2   atorvastatin  (LIPITOR) 80 MG tablet Take 1 tablet (80 mg total) by mouth daily. 90 tablet 3   buPROPion  (WELLBUTRIN  XL) 300 MG 24 hr tablet Take 1 tablet (300 mg total) by mouth daily. 90 tablet 3   Cholecalciferol  (VITAMIN D3) 50 MCG (2000 UT) capsule Take 2,000 Units by mouth daily.     citalopram  (CELEXA ) 20 MG tablet Take 1 tablet (20 mg total) by mouth daily. 90 tablet 3   cloNIDine  (CATAPRES ) 0.2 MG tablet Take 0.2 mg by mouth 2 (two) times daily.     clopidogrel  (PLAVIX ) 75 MG tablet Take 1 tablet (75 mg total) by mouth daily. 90 tablet 3   cyanocobalamin  (VITAMIN B12) 1000 MCG tablet Take 1 tablet (1,000 mcg total) by mouth daily. 90 tablet 3   fluticasone -salmeterol (WIXELA INHUB) 250-50 MCG/ACT AEPB Inhale 1 puff into the lungs in the morning and at bedtime. 180 each 6   gabapentin (NEURONTIN) 100 MG capsule Take 100 mg by mouth 2 (two) times daily.     insulin  aspart (NOVOLOG ) 100 UNIT/ML injection Inject 5 Units into the skin 3 (three) times daily before meals.     insulin  degludec (TRESIBA FLEXTOUCH) 100 UNIT/ML FlexTouch Pen Inject 20 Units into the skin daily. 20 units once daily - REPLACES Levemir       Iron , Ferrous Sulfate , 325 (65 Fe) MG TABS Take 1 tablet by mouth daily. 90 tablet 3   losartan -hydrochlorothiazide  (HYZAAR) 100-25 MG tablet Take 1 tablet by mouth daily. 90 tablet 3   megestrol  (MEGACE ) 40 MG tablet Take 1 tablet (40 mg total) by mouth daily. 30 tablet 2   metoprolol  succinate (TOPROL -XL) 50 MG 24 hr tablet Take 1 tablet (50 mg total) by mouth daily. 90 tablet 3   montelukast  (SINGULAIR ) 10 MG tablet Take 10 mg by mouth at bedtime.     ondansetron  (ZOFRAN ) 8 MG tablet Take 1 tablet (8 mg total) by mouth every 8 (eight) hours as needed for nausea or vomiting. 30 tablet 0   ONETOUCH VERIO test strip 1 each by Other route 3 (three) times daily. 100 each 10   Tiotropium Bromide Monohydrate  (SPIRIVA  RESPIMAT) 2.5 MCG/ACT AERS Inhale 2 puffs into the lungs daily. 60 each 11   No current  facility-administered medications for this visit.    OBJECTIVE: Vitals:   11/27/23 0944  BP: 112/60  Pulse: (!) 58  Resp: 16  Temp: 97.8 F (36.6 C)  SpO2: 98%       Body mass index is 29.26 kg/m.    ECOG FS:1 - Symptomatic but completely ambulatory  General: Well-developed, well-nourished, no acute distress. Eyes: Pink conjunctiva, anicteric sclera. HEENT: Normocephalic, moist mucous membranes. Lungs: No audible wheezing or coughing. Heart: Regular rate and rhythm. Abdomen: Soft, nontender, no obvious distention. Musculoskeletal: No edema, cyanosis, or clubbing. Neuro: Alert, answering all questions appropriately. Cranial nerves grossly intact. Skin: No rashes or petechiae noted. Psych: Normal affect.  LAB RESULTS:  Lab Results  Component Value Date   NA 135 11/27/2023   K 3.8 11/27/2023   CL 99 11/27/2023   CO2 28 11/27/2023   GLUCOSE 277 (H) 11/27/2023   BUN 40 (H) 11/27/2023   CREATININE 1.33 (H) 11/27/2023   CALCIUM  8.6 (L) 11/27/2023   PROT 5.8 (L) 11/27/2023   ALBUMIN 2.9 (L) 11/27/2023   AST 14 (L) 11/27/2023   ALT 10 11/27/2023   ALKPHOS 81 11/27/2023    BILITOT 0.7 11/27/2023   GFRNONAA 42 (L) 11/27/2023   GFRAA >60 03/27/2017    Lab Results  Component Value Date   WBC 4.9 11/27/2023   NEUTROABS 3.5 11/27/2023   HGB 9.0 (L) 11/27/2023   HCT 28.4 (L) 11/27/2023   MCV 83.3 11/27/2023   PLT 250 11/27/2023     STUDIES: No results found.  ONCOLOGY HISTORY: Patient was initially hesitant to undergo chemotherapy and elected to do XRT only which was completed on April 18, 2022.  CT scan results from October 08, 2022 reviewed independently with no obvious evidence of progressive disease. She completed 6 cycles of carboplatinum, Taxol , and Keytruda  on October 03, 2022.  Patient then initiated maintenance Keytruda  on October 23, 2022.  PET scan results from January 15, 2023 reviewed independently with mild nonspecific residual hypermetabolism in the lower uterine segment as well as small bilateral common iliac nodes possibly suggesting nodal metastasis.  Patient was seen by gynecology oncology who determined surgical intervention is not an option.  They recommend discontinuing Keytruda  for progressive disease and initiating single agent Doxil  every 28 days.  Patient received 3 cycles of Doxil  before progression of disease her last dose was given on June 05, 2023.  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  Repeat PET scan on October 15, 2023 reviewed independently with significant improvement of disease burden.  Internal exam by gynecology oncology also confirmed disease improvement.  Proceed with cycle 5, day 8 of single agent Taxol  today.  Return to clinic in 1 week for further evaluation and consideration of cycle 5, day 15.  Plan to reimage after the conclusion of cycle 6.  If residual disease at that time, can consider internal brachytherapy at [facility].   Port: Port revision successful.   Proceed with treatment as above. Hyperglycemia: Chronic and unchanged.  Patient continues to have poor blood glucose control. Hypokalemia: Resolved. Renal insufficiency: Slightly worse today at 1.33.  Monitor. Leukopenia: Resolved. Anemia: Hemoglobin improved to 9.0 with transfusion.   Vaginal bleeding: Improved.  Continue follow-up with gynecology oncology as needed.   Claudication symptoms: Resolved.  Patient underwent revascularization procedure on February 04, 2023. Right hamstring tendon rupture: Patient reports there is no plan for surgical  repair. Rash: Resolved.  Likely secondary to Doxil  which has been discontinued.   Poor appetite/weight loss: Continue Megace  as prescribed.  Patient refuses referral to dietary.   Neuropathy/dizziness: Chronic and unchanged.  Continue follow-up with neurology as recommended.   Falls: Intermittent.  Patient now has 2 walkers at her home upstairs and downstairs.  She admits to only using them occasionally.  Patient also by report fell in clinic today, but did not sustain injury and refused further evaluation.   Patient expressed understanding and was in agreement with this plan. She also understands that She can call clinic at any time with any questions, concerns, or complaints.    Cancer Staging  Endometrial cancer [facility]) Staging form: Corpus Uteri - Carcinoma and Carcinosarcoma, AJCC 8th Edition - Clinical stage from 05/30/2022: FIGO Stage IIIB (cT3b, cN0, cM0) - Signed by Jacobo Evalene PARAS, MD on 05/30/2022 Stage prefix: Initial diagnosis   Evalene PARAS Jacobo, MD   11/27/2023 10:19 AM

## 2023-11-27 NOTE — Progress Notes (Signed)
 After completing treatment today, pt assisted to the bathroom and became dizzy and began to fall. Pt was caught by [employer] and lowered to ground. Pt assisted up and was placed into wheelchair. No injury noted and pt denied pain. Vital signs stable. Jacobo, MD notified. MD verified ok to discharge at this time. Pt educated to notify us  with any issues or concerns and was stable at discharge.

## 2023-11-28 ENCOUNTER — Encounter: Payer: Self-pay | Admitting: Oncology

## 2023-11-28 ENCOUNTER — Ambulatory Visit

## 2023-12-02 ENCOUNTER — Other Ambulatory Visit: Admitting: Pharmacist

## 2023-12-02 NOTE — Progress Notes (Signed)
 12/02/2023 Name: Terri [name] MRN: [id number] DOB: 01-05-1951  Subjective  Chief Complaint  Patient presents with   Diabetes   Reason for visit: ?  Terri [name] is a 73 y.o. female with a history of diabetes (type 2), who presents today for a follow up visit for the management of diabetes.   Pertinent PMH also includes atherosclerosis w intermittent claudication, HTN, COPD, HLD, Endometrial cancer, obesity.  Known DM Complications:  nephropathy w microalbuminuria; PAD with claudication BLE; HTN    Care Team: Primary Care Provider: Lester, Kacy, NP [place] ? Latisha Glance [facility] [place] 11/04/23 Pulmonology Dr. Isadora; Oncology Dr. Jacobo; Cardiology at [facility]  Last Diabetes-Related Visit: With PCP 09/25/23 Recent Summary of Change: 6/20: Patient decision to stop Ozempic   5/28: A1c 6.7%. BG all within goal though with occasional overnight lows, as low as 50s. ??Tresiba to 20 u/d. Stop glipizide .  ??Tresiba 22 units daily (12%??). 25 units on day of chemo and day following chemo.   Since Last visit / History of Present Illness: ?  Patient reports doing well overall. Remains off of Ozempic .  Feels blood sugars have been variable. Feels morning sugars are well-controlled (reports on average 130s). Lowest 76 mg/dL. In the evenings, however, notes sugars are typically elevated in the 200s mg/dL (before dinner).  States she has switched insurance for something that covers her chemo treatments better. Coverage started 11/29/23. Has not received new card in the mail yet.   Reported DM Regimen: ?  Novolog  (aspart) 5 units up to three times daily before meals (sometimes 7 units if BG high prior to eating) [place] U100 20 units daily  DM medications tried in the past:?  Metformin  (diarrhea); switched to glipizide  Glipizide  (D/c 08/2023 due to increasing hypoglycemia) Ozempic  (tolerated well, self-discontinued ~10/18/23 due to cancer-related weight loss)  SMBG: Glucometer - Checks BG twice daily via glucometer  (fasting, pre-dinner) Computer recently broke (stored readings on excel)  Notes FGB usually 130s. Lowest 76.  Pre-dinner: consistently >200 mg/dL   Hypo/Hyperglycemia: ?  Symptoms of hypoglycemia since last visit:? no.    Reported Diet:  Patient typically eats 3 meals per day. Feels appetite is normalizing to pre-chemo state. Breakfast (8:30): Varies Lunch (12-1pm): Pizza (under 50 carbs); Sandwich; Chicken salad+crackers Dinner (7-7:30 pm): Largest meal of the day Snacks: Sweet tooth (states that most sweets are sugar-free though in more recent weeks, with her low Bgs she has been having more ice cream and pound cakes. No more lows the past week, plans to discontinue this)  Exercise: No  DM Prevention:  Statin: Taking; high intensity.?  History of chronic kidney disease? yes History of albuminuria? yes, last UACR on 01/24/23 = 100.4 mg/g ACE/ARB - Taking; Urine MA/CR Ratio - elevated urinary albumin excretion.  Last foot exam: 02/25/2023 Tobacco Use: Former   Cardiovascular Risk Reduction History of clinical ASCVD? PAD w Claudication The 10-year ASCVD risk score (Arnett DK, et al., 2019) is: 59.6% History of heart failure? no History of hyperlipidemia? yes Current BMI: 28.3 kg/m2 (Ht 62 in, Wt 70.3 kg) Taking statin? yes; high intensity (atorvastaitn 80 mg) Taking aspirin ? indicated (secondary prevention); Taking clopidogrel    Taking SGLT-2i? no Taking GLP- 1 RA? yes    _______________________________________________  Objective    Review of Systems:? Limited in the setting of virtual visit  GI:? No nausea, vomiting, constipation, diarrhea, abdominal pain, dyspepsia, change in bowel habits  Endocrine:? No polyuria, polyphagia or blurred vision    Physical Examination:  Vitals:  Wt Readings from Last  3 Encounters:  11/27/23 160 lb (72.6 kg)  11/13/23 161 lb (73 kg)  11/04/23 156 lb 3.2 oz (70.9 kg)   BP Readings from Last 3 Encounters:  11/27/23 (!) 165/67  11/27/23  112/60  11/14/23 (!) 174/81   Pulse Readings from Last 3 Encounters:  11/27/23 60  11/27/23 (!) 58  11/14/23 69   Labs:?  Lab Results  Component Value Date   HGBA1C 6.1 (A) 09/25/2023   HGBA1C 5.7 (A) 06/26/2023   HGBA1C 7.2 (H) 12/25/2022   GLUCOSE 277 (H) 11/27/2023   CREATININE 1.33 (H) 11/27/2023   CREATININE 1.18 (H) 11/13/2023   CREATININE 0.90 10/30/2023   GFR 52.91 (L) 01/24/2023   Lab Results  Component Value Date   CHOL 156 01/24/2023   LDLCALC 87 01/24/2023   HDL 46.20 01/24/2023   TRIG 112.0 01/24/2023   ALT 10 11/27/2023   ALT 11 11/13/2023   AST 14 (L) 11/27/2023   AST 15 11/13/2023     Chemistry      Component Value Date/Time   NA 135 11/27/2023 0928   K 3.8 11/27/2023 0928   CL 99 11/27/2023 0928   CO2 28 11/27/2023 0928   BUN 40 (H) 11/27/2023 0928   CREATININE 1.33 (H) 11/27/2023 0928      Component Value Date/Time   CALCIUM  8.6 (L) 11/27/2023 0928   ALKPHOS 81 11/27/2023 0928   AST 14 (L) 11/27/2023 0928   ALT 10 11/27/2023 0928   BILITOT 0.7 11/27/2023 0928     The 10-year ASCVD risk score (Arnett DK, et al., 2019) is: 59.6% Assessment and Plan:   1. Diabetes, type 2: controlled A1c 6.1% (09/25/23), goal <7.5-8% without hypoglycemia given medical complexity and active cancer treatment. Sugars have remained very well controlled fasting though with pre-dinner hyperglycinemia >200s mg/dL indicating need for additional lunchtime dose.    Patient declines correctional scale insulin . Previously instructions for basal increase on chemo days was too difficult to remember and resulted in incorrect dose administration. Patient to continue monitoring BG twice daily. Instructed to reach out if consistently starting to see numbers higher than current on day of chemo, or if seeing sugars consistently <80 fasting.  Current regimen: Tresiba 20 units daily, Novolog  5 units TIDAC Continue Tresiba 20 units daily Novolog  5 units AC breakfast/dinner, 7 unit AC  lunch. CF = 51 Continue BID blood glucose checks. Not using CGM.  Reviewed s/sx/tx hypoglycemia. Future: New glucometer likely covered by new medicare plan if needed. Patient verbalizes understanding.   New insurance - transportation befits? Has not received new insurance card yet. Plans to call insurance today.    Follow up PharmD f/u via phone ~2 weeks Patient provided with direct line for questions/concerns prior to next follow up visit.   [name], PharmD Clinical Pharmacist [facility] [phone]

## 2023-12-03 ENCOUNTER — Inpatient Hospital Stay

## 2023-12-03 ENCOUNTER — Inpatient Hospital Stay (HOSPITAL_BASED_OUTPATIENT_CLINIC_OR_DEPARTMENT_OTHER): Admitting: Nurse Practitioner

## 2023-12-03 ENCOUNTER — Encounter

## 2023-12-03 ENCOUNTER — Other Ambulatory Visit: Payer: Self-pay | Admitting: *Deleted

## 2023-12-03 ENCOUNTER — Encounter: Payer: Self-pay | Admitting: Nurse Practitioner

## 2023-12-03 ENCOUNTER — Inpatient Hospital Stay: Attending: Obstetrics and Gynecology

## 2023-12-03 ENCOUNTER — Ambulatory Visit

## 2023-12-03 VITALS — BP 93/44 | HR 62 | Temp 98.6°F | Resp 19 | Wt 155.5 lb

## 2023-12-03 DIAGNOSIS — G629 Polyneuropathy, unspecified: Secondary | ICD-10-CM | POA: Insufficient documentation

## 2023-12-03 DIAGNOSIS — C541 Malignant neoplasm of endometrium: Secondary | ICD-10-CM | POA: Diagnosis present

## 2023-12-03 DIAGNOSIS — D649 Anemia, unspecified: Secondary | ICD-10-CM | POA: Insufficient documentation

## 2023-12-03 DIAGNOSIS — D6481 Anemia due to antineoplastic chemotherapy: Secondary | ICD-10-CM

## 2023-12-03 DIAGNOSIS — E1165 Type 2 diabetes mellitus with hyperglycemia: Secondary | ICD-10-CM | POA: Insufficient documentation

## 2023-12-03 DIAGNOSIS — Z5111 Encounter for antineoplastic chemotherapy: Secondary | ICD-10-CM

## 2023-12-03 DIAGNOSIS — D72819 Decreased white blood cell count, unspecified: Secondary | ICD-10-CM | POA: Diagnosis not present

## 2023-12-03 DIAGNOSIS — T451X5A Adverse effect of antineoplastic and immunosuppressive drugs, initial encounter: Secondary | ICD-10-CM

## 2023-12-03 DIAGNOSIS — N289 Disorder of kidney and ureter, unspecified: Secondary | ICD-10-CM | POA: Insufficient documentation

## 2023-12-03 LAB — CMP (CANCER CENTER ONLY)
ALT: 11 U/L (ref 0–44)
AST: 17 U/L (ref 15–41)
Albumin: 2.8 g/dL — ABNORMAL LOW (ref 3.5–5.0)
Alkaline Phosphatase: 70 U/L (ref 38–126)
Anion gap: 9 (ref 5–15)
BUN: 44 mg/dL — ABNORMAL HIGH (ref 8–23)
CO2: 27 mmol/L (ref 22–32)
Calcium: 8.4 mg/dL — ABNORMAL LOW (ref 8.9–10.3)
Chloride: 103 mmol/L (ref 98–111)
Creatinine: 1.27 mg/dL — ABNORMAL HIGH (ref 0.44–1.00)
GFR, Estimated: 45 mL/min — ABNORMAL LOW (ref >60)
Glucose, Bld: 271 mg/dL — ABNORMAL HIGH (ref 70–99)
Potassium: 4 mmol/L (ref 3.5–5.1)
Sodium: 139 mmol/L (ref 135–145)
Total Bilirubin: 0.5 mg/dL (ref 0.0–1.2)
Total Protein: 5.3 g/dL — ABNORMAL LOW (ref 6.5–8.1)

## 2023-12-03 LAB — CBC WITH DIFFERENTIAL (CANCER CENTER ONLY)
Abs Immature Granulocytes: 0.05 K/uL (ref 0.00–0.07)
Basophils Absolute: 0 K/uL (ref 0.0–0.1)
Basophils Relative: 1 %
Eosinophils Absolute: 0.1 K/uL (ref 0.0–0.5)
Eosinophils Relative: 3 %
HCT: 25 % — ABNORMAL LOW (ref 36.0–46.0)
Hemoglobin: 8 g/dL — ABNORMAL LOW (ref 12.0–15.0)
Immature Granulocytes: 1 %
Lymphocytes Relative: 9 %
Lymphs Abs: 0.4 K/uL — ABNORMAL LOW (ref 0.7–4.0)
MCH: 26.7 pg (ref 26.0–34.0)
MCHC: 32 g/dL (ref 30.0–36.0)
MCV: 83.3 fL (ref 80.0–100.0)
Monocytes Absolute: 0.2 K/uL (ref 0.1–1.0)
Monocytes Relative: 5 %
Neutro Abs: 3.4 K/uL (ref 1.7–7.7)
Neutrophils Relative %: 81 %
Platelet Count: 270 K/uL (ref 150–400)
RBC: 3 MIL/uL — ABNORMAL LOW (ref 3.87–5.11)
RDW: 16.7 % — ABNORMAL HIGH (ref 11.5–15.5)
WBC Count: 4.1 K/uL (ref 4.0–10.5)
nRBC: 0 % (ref 0.0–0.2)

## 2023-12-03 LAB — PREPARE RBC (CROSSMATCH)

## 2023-12-03 MED ORDER — FAMOTIDINE IN NACL 20-0.9 MG/50ML-% IV SOLN
20.0000 mg | Freq: Once | INTRAVENOUS | Status: AC
  Administered 2023-12-03: 20 mg via INTRAVENOUS
  Filled 2023-12-03: qty 50

## 2023-12-03 MED ORDER — DIPHENHYDRAMINE HCL 50 MG/ML IJ SOLN
25.0000 mg | Freq: Once | INTRAMUSCULAR | Status: AC
  Administered 2023-12-03: 25 mg via INTRAVENOUS
  Filled 2023-12-03: qty 1

## 2023-12-03 MED ORDER — DEXAMETHASONE SODIUM PHOSPHATE 10 MG/ML IJ SOLN
10.0000 mg | Freq: Once | INTRAMUSCULAR | Status: AC
  Administered 2023-12-03: 10 mg via INTRAVENOUS

## 2023-12-03 MED ORDER — SODIUM CHLORIDE 0.9 % IV SOLN
INTRAVENOUS | Status: DC
  Filled 2023-12-03: qty 250

## 2023-12-03 MED ORDER — SODIUM CHLORIDE 0.9 % IV SOLN
80.0000 mg/m2 | Freq: Once | INTRAVENOUS | Status: AC
  Administered 2023-12-03: 150 mg via INTRAVENOUS
  Filled 2023-12-03: qty 25

## 2023-12-03 NOTE — Patient Instructions (Signed)
 [facility] - A DEPT OF [facility]  Discharge Instructions: Thank you for choosing [facility] to provide your oncology and hematology care.  If you have a lab appointment with the Cancer Center, please go directly to the Cancer Center and check in at the registration area.  Wear comfortable clothing and clothing appropriate for easy access to any Portacath or PICC line.   We strive to give you quality time with your provider. You may need to reschedule your appointment if you arrive late (15 or more minutes).  Arriving late affects you and other patients whose appointments are after yours.  Also, if you miss three or more appointments without notifying the office, you may be dismissed from the clinic at the provider's discretion.      For prescription refill requests, have your pharmacy contact our office and allow 72 hours for refills to be completed.    Today you received the following chemotherapy and/or immunotherapy agents : PACLitaxel       To help prevent nausea and vomiting after your treatment, we encourage you to take your nausea medication as directed.  BELOW ARE SYMPTOMS THAT SHOULD BE REPORTED IMMEDIATELY: *FEVER GREATER THAN 100.4 F (38 C) OR HIGHER *CHILLS OR SWEATING *NAUSEA AND VOMITING THAT IS NOT CONTROLLED WITH YOUR NAUSEA MEDICATION *UNUSUAL SHORTNESS OF BREATH *UNUSUAL BRUISING OR BLEEDING *URINARY PROBLEMS (pain or burning when urinating, or frequent urination) *BOWEL PROBLEMS (unusual diarrhea, constipation, pain near the anus) TENDERNESS IN MOUTH AND THROAT WITH OR WITHOUT PRESENCE OF ULCERS (sore throat, sores in mouth, or a toothache) UNUSUAL RASH, SWELLING OR PAIN  UNUSUAL VAGINAL DISCHARGE OR ITCHING   Items with * indicate a potential emergency and should be followed up as soon as possible or go to the Emergency Department if any problems should occur.  Please show the CHEMOTHERAPY ALERT CARD or IMMUNOTHERAPY  ALERT CARD at check-in to the Emergency Department and triage nurse.  Should you have questions after your visit or need to cancel or reschedule your appointment, please contact [facility] - A DEPT OF [facility]  [phone] and follow the prompts.  Office hours are 8:00 a.m. to 4:30 p.m. Monday - Friday. Please note that voicemails left after 4:00 p.m. may not be returned until the following business day.  We are closed weekends and major holidays. You have access to a nurse at all times for urgent questions. Please call the main number to the clinic [phone] and follow the prompts.  For any non-urgent questions, you may also contact your provider using MyChart. We now offer e-Visits for anyone 45 and older to request care online for non-urgent symptoms. For details visit [email or web address].   Also download the MyChart app! Go to the app store, search "MyChart", open the app, select [place], and log in with your MyChart username and password.

## 2023-12-03 NOTE — Progress Notes (Signed)
 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  INTERVAL HISTORY: Patient returns to clinic today for further evaluation and consideration of cycle 5, day 15 of single agent Taxol .  She suffered a fall with day 8 which she describes as a trip.  Today, she feels better.  Her strength is improved. Complains of right flank pain.  She currently feels well.  Continues to have a chronic peripheral neuropathy, but no other neurologic complaints.  She does not complain of any weakness or fatigue.  Vaginal bleeding described as spotting.  She denies any recent fevers or illnesses.  She has no chest pain, shortness of breath, cough, or hemoptysis.  She denies any nausea, vomiting, constipation, or diarrhea.  She has no urinary complaints.  Patient offers no further specific complaints today.  REVIEW OF SYSTEMS:   Review of Systems  Constitutional:  Positive for malaise/fatigue. Negative for fever and weight loss.  Respiratory: Negative.  Negative for cough, hemoptysis and shortness of breath.   Cardiovascular: Negative.  Negative for chest pain and leg swelling.  Gastrointestinal: Negative.  Negative for abdominal pain, blood in stool and melena.  Genitourinary: Negative.  Negative for dysuria.  Musculoskeletal:  Positive for falls. Negative for back pain.  Skin: Negative.  Negative for rash.  Neurological:  Positive for sensory  change and weakness. Negative for dizziness, focal weakness and headaches.  Psychiatric/Behavioral: Negative.  The patient is not nervous/anxious.   As per HPI. Otherwise, a complete review of systems is negative.  PAST MEDICAL HISTORY: Past Medical History:  Diagnosis Date   Adenomatous colon polyp    Aortic stenosis    Arthritis    Asthma    Cancer (HCC)    Claudication (HCC)    COPD (chronic obstructive pulmonary disease) (HCC)    Depression    Diabetes mellitus without complication (HCC)    Esophageal dysphagia    GERD (gastroesophageal reflux disease)    Hyperlipemia    Hypertension    Obesity    Severe obesity (BMI 35.0-35.9 with comorbidity) (HCC)     PAST SURGICAL HISTORY: Past Surgical History:  Procedure Laterality Date   BLADDER SURGERY     COLONOSCOPY WITH PROPOFOL  N/A 08/05/2015   Procedure: COLONOSCOPY WITH PROPOFOL ;  Surgeon: Donnice Vaughn Manes, MD;  Location: [facility] ENDOSCOPY;  Service: Endoscopy;  Laterality: N/A;   ESOPHAGOGASTRODUODENOSCOPY N/A 01/09/2022   Procedure: ESOPHAGOGASTRODUODENOSCOPY (EGD);  Surgeon: Maryruth Ole DASEN, MD;  Location: [facility] ENDOSCOPY;  Service: Endoscopy;  Laterality: N/A;   FOOT SURGERY Left    FRACTURE SURGERY     IR CV LINE INJECTION  06/22/2022   IR IMAGING GUIDED PORT INSERTION  06/05/2022   IR PORT REPAIR CENTRAL VENOUS ACCESS DEVICE  07/02/2022   LOWER EXTREMITY ANGIOGRAPHY Left 02/04/2023   Procedure: Lower Extremity Angiography;  Surgeon: Marea Selinda RAMAN, MD;  Location: [facility] INVASIVE CV LAB;  Service: Cardiovascular;  Laterality: Left;   ORIF ANKLE FRACTURE Left 03/26/2017   Procedure: OPEN REDUCTION INTERNAL FIXATION (ORIF) ANKLE FRACTURE;  Surgeon: Edie Norleen PARAS, MD;  Location: [facility] ORS;  Service: Orthopedics;  Laterality: Left;    FAMILY HISTORY: Family History  Problem Relation Age of Onset   CVA Mother    Diabetes Mother    CAD Father    Breast cancer Neg Hx     ADVANCED DIRECTIVES (Y/N):  N  HEALTH  MAINTENANCE: Social History   Tobacco Use   Smoking status: Former    Current packs/day: 1.00    Average packs/day: 1 pack/day for 40.0 years (40.0 ttl pk-yrs)    Types: Cigarettes   Smokeless tobacco: Never   Tobacco comments:    Quite 2015  Vaping Use   Vaping status: Never Used  Substance Use Topics   Alcohol use: No   Drug use: No     Colonoscopy:  PAP:  Bone density:  Lipid panel:  No Known Allergies  Current Outpatient Medications  Medication Sig Dispense Refill   albuterol  (VENTOLIN  HFA) 108 (90 Base) MCG/ACT inhaler Inhale 2 puffs into the lungs every 6 (six) hours as needed for wheezing or shortness of breath. 1 each 1   amLODipine  (NORVASC ) 10 MG tablet Take 1 tablet (10 mg total) by mouth daily. 90 tablet 3   aspirin  EC 81 MG tablet Take 1 tablet (81 mg total) by mouth daily. Swallow whole. 150 tablet 2   atorvastatin  (LIPITOR) 80 MG tablet Take 1 tablet (80 mg total) by mouth daily. 90 tablet 3   buPROPion  (WELLBUTRIN  XL) 300 MG 24 hr tablet Take 1 tablet (300 mg total) by mouth daily. 90 tablet 3   Cholecalciferol  (VITAMIN D3) 50 MCG (2000 UT) capsule Take 2,000 Units by mouth daily.     citalopram  (CELEXA ) 20 MG tablet Take 1 tablet (20 mg total) by mouth daily. 90 tablet 3   cloNIDine  (CATAPRES ) 0.2 MG tablet Take 0.2 mg by mouth 2 (two) times daily.     clopidogrel  (PLAVIX ) 75 MG tablet Take 1 tablet (75 mg total) by mouth daily. 90 tablet 3   cyanocobalamin  (VITAMIN B12) 1000 MCG tablet Take 1 tablet (1,000 mcg total) by mouth daily. 90 tablet 3   fluticasone -salmeterol (WIXELA INHUB) 250-50 MCG/ACT AEPB Inhale 1 puff into the lungs in the morning and at bedtime. 180 each 6   gabapentin (NEURONTIN) 100 MG capsule Take 100 mg by mouth 2 (two) times daily.     insulin  aspart (NOVOLOG ) 100 UNIT/ML injection Inject 5 Units into the skin 3 (three) times daily before meals.     insulin  degludec (TRESIBA FLEXTOUCH) 100 UNIT/ML FlexTouch Pen Inject 20 Units into the  skin daily. 20 units once daily - REPLACES Levemir      Iron , Ferrous Sulfate , 325 (65 Fe) MG TABS Take 1 tablet by mouth daily. 90 tablet 3   losartan -hydrochlorothiazide  (HYZAAR) 100-25 MG tablet Take 1 tablet by mouth daily. 90 tablet 3   megestrol  (MEGACE ) 40 MG tablet Take 1 tablet (40 mg total) by mouth daily. 30 tablet 2   metoprolol  succinate (TOPROL -XL) 50 MG 24 hr tablet Take 1 tablet (50 mg total) by mouth daily. 90 tablet 3   montelukast  (SINGULAIR ) 10 MG tablet Take 10 mg by mouth at bedtime.     ondansetron  (ZOFRAN ) 8 MG tablet Take 1 tablet (8 mg total) by mouth every 8 (eight) hours as needed for nausea or vomiting. 30 tablet 0   ONETOUCH VERIO test strip 1 each by Other route 3 (three) times daily. 100 each 10   Tiotropium Bromide Monohydrate  (SPIRIVA  RESPIMAT) 2.5 MCG/ACT AERS Inhale 2  puffs into the lungs daily. 60 each 11   No current facility-administered medications for this visit.    OBJECTIVE: Vitals:   12/03/23 1325  BP: (!) 93/44  Pulse: 62  Resp: 19  Temp: 98.6 F (37 C)  SpO2: 98%   Body mass index is 28.44 kg/m.    ECOG FS:1 - Symptomatic but completely ambulatory  General: Well-developed, well-nourished, no acute distress. Eyes: Pink conjunctiva, anicteric sclera. Lungs: Clear to auscultation bilaterally.  No audible wheezing or coughing Heart: Regular rate and rhythm.  Abdomen: Soft, nontender, nondistended.  Musculoskeletal: No edema, cyanosis, or clubbing. Neuro: Alert, answering all questions appropriately. Cranial nerves grossly intact. Skin: No rashes or petechiae noted. Psych: Normal affect.   LAB RESULTS:  Lab Results  Component Value Date   NA 139 12/03/2023   K 4.0 12/03/2023   CL 103 12/03/2023   CO2 27 12/03/2023   GLUCOSE 271 (H) 12/03/2023   BUN 44 (H) 12/03/2023   CREATININE 1.27 (H) 12/03/2023   CALCIUM  8.4 (L) 12/03/2023   PROT 5.3 (L) 12/03/2023   ALBUMIN 2.8 (L) 12/03/2023   AST 17 12/03/2023   ALT 11 12/03/2023    ALKPHOS 70 12/03/2023   BILITOT 0.5 12/03/2023   GFRNONAA 45 (L) 12/03/2023   GFRAA >60 03/27/2017    Lab Results  Component Value Date   WBC 4.1 12/03/2023   NEUTROABS 3.4 12/03/2023   HGB 8.0 (L) 12/03/2023   HCT 25.0 (L) 12/03/2023   MCV 83.3 12/03/2023   PLT 270 12/03/2023    STUDIES: No results found.  ONCOLOGY HISTORY: Patient was initially hesitant to undergo chemotherapy and elected to do XRT only which was completed on April 18, 2022.  CT scan results from October 08, 2022 reviewed independently with no obvious evidence of progressive disease. She completed 6 cycles of carboplatinum, Taxol , and Keytruda  on October 03, 2022.  Patient then initiated maintenance Keytruda  on October 23, 2022.  PET scan results from January 15, 2023 reviewed independently with mild nonspecific residual hypermetabolism in the lower uterine segment as well as small bilateral common iliac nodes possibly suggesting nodal metastasis.  Patient was seen by gynecology oncology who determined surgical intervention is not an option.  They recommend discontinuing Keytruda  for progressive disease and initiating single agent Doxil  every 28 days.  Patient received 3 cycles of Doxil  before progression of disease her last dose was given on June 05, 2023.  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  Repeat PET scan on October 15, 2023 reviewed independently with significant improvement of disease burden.  Internal exam by gynecology oncology also confirmed disease improvement.  Proceed with cycle 5, day 15 of single agent Taxol  today.  Return to clinic tomorrow for blood transfusion.  She will return to clinic in 1 week for labs, further evaluation, and consideration of weekly Taxol .  We will plan to reimage after the conclusion of cycle 6.  If residual disease at that time,  consider internal brachytherapy at [facility].  Port: Port revision successful.  Proceed with treatment as above. Hyperglycemia: Chronic and unchanged.  Patient continues to have poor blood glucose control. Hypokalemia: Resolved. Renal insufficiency: Slightly worse today at 1.27.  Monitor. Leukopenia: Resolved. Anemia: Hemoglobin improved to 9.0 with transfusion.   Vaginal bleeding: Improved.  Continue follow-up with gynecology oncology as needed.   Claudication symptoms: Resolved.  Patient underwent revascularization procedure on  February 04, 2023. Right hamstring tendon rupture: Patient reports there is no plan for surgical repair. Rash: Resolved.  Likely secondary to Doxil  which has been discontinued.   Poor appetite/weight loss: Continue Megace  as prescribed.  Patient refuses referral to dietary.   Neuropathy/dizziness: Chronic and unchanged.  Continue follow-up with neurology as recommended.   Falls: Intermittent.  Patient now has 2 walkers at her home upstairs and downstairs.  She admits to only using them occasionally.  She suffered a fall last week in clinic.  Denies injury and refused further evaluation again today.  Next available blood Tx today F/u per IS- la  Patient expressed understanding and was in agreement with this plan. She also understands that She can call clinic at any time with any questions, concerns, or complaints.    Cancer Staging  Endometrial cancer [facility]) Staging form: Corpus Uteri - Carcinoma and Carcinosarcoma, AJCC 8th Edition - Clinical stage from 05/30/2022: FIGO Stage IIIB (cT3b, cN0, cM0) - Signed by Jacobo Evalene PARAS, MD on 05/30/2022 Stage prefix: Initial diagnosis  Terri KANDICE Dawn, NP   12/03/2023

## 2023-12-05 ENCOUNTER — Telehealth: Payer: Self-pay | Admitting: *Deleted

## 2023-12-05 ENCOUNTER — Inpatient Hospital Stay

## 2023-12-05 ENCOUNTER — Ambulatory Visit

## 2023-12-05 DIAGNOSIS — D649 Anemia, unspecified: Secondary | ICD-10-CM

## 2023-12-05 DIAGNOSIS — Z5111 Encounter for antineoplastic chemotherapy: Secondary | ICD-10-CM | POA: Diagnosis not present

## 2023-12-05 MED ORDER — ACETAMINOPHEN 325 MG PO TABS
650.0000 mg | ORAL_TABLET | Freq: Once | ORAL | Status: AC
  Administered 2023-12-05: 650 mg via ORAL
  Filled 2023-12-05: qty 2

## 2023-12-05 MED ORDER — SODIUM CHLORIDE 0.9% IV SOLUTION
250.0000 mL | INTRAVENOUS | Status: DC
  Administered 2023-12-05: 100 mL via INTRAVENOUS
  Filled 2023-12-05: qty 250

## 2023-12-05 MED ORDER — DIPHENHYDRAMINE HCL 50 MG/ML IJ SOLN
25.0000 mg | Freq: Once | INTRAMUSCULAR | Status: AC
  Administered 2023-12-05: 25 mg via INTRAVENOUS
  Filled 2023-12-05: qty 1

## 2023-12-05 NOTE — Patient Instructions (Signed)

## 2023-12-05 NOTE — Telephone Encounter (Signed)
 Patient notified via mychart that Patient Assistance Medication are in the office & are ready for pick up. Tried to leave voicemail, mailbox full.   Medication: Novofine 32G pen needles  Quantity: 4 boxes  Lot# [id number]  Exp: 03/29/2028

## 2023-12-05 NOTE — Progress Notes (Signed)
 [facility] CSW Progress Note  Visual merchandiser met with patient to follow-up on emotional support.  Patient expressed feeling somewhat sedative due to meds.    Interventions: Patient interviewed and appropriate assessments performed: brief mental health assessment .  She engaged in conversation and expressed no needs at this time.      Follow Up Plan:  CSW will follow-up with patient by phone     Macario CHRISTELLA Au, LCSW Clinical Social Worker [facility]

## 2023-12-06 ENCOUNTER — Other Ambulatory Visit: Payer: Self-pay | Admitting: Pharmacist

## 2023-12-06 LAB — TYPE AND SCREEN
ABO/RH(D): A POS
Antibody Screen: NEGATIVE
Unit division: 0

## 2023-12-06 LAB — BPAM RBC
Blood Product Expiration Date: 202509062359
ISSUE DATE / TIME: 202508071250
Unit Type and Rh: 202509062359
Unit Type and Rh: 6200

## 2023-12-06 NOTE — Progress Notes (Signed)
 Brief Telephone Documentation Reason for Call: Patient left message regarding question for pharmacist. Out of medication  Summary of Call: Patient states she is completely out of her medication: Fluticsone-salmeterol 250-50 This medication was prescribed by Isadora Hose, MD Last Rx = December + 6 refills. Rx likely expired.   Patient to request refills from Pulmonology office  Next pulm visit = 01/03/24

## 2023-12-10 ENCOUNTER — Encounter: Payer: Self-pay | Admitting: Oncology

## 2023-12-10 ENCOUNTER — Ambulatory Visit

## 2023-12-10 ENCOUNTER — Encounter: Payer: Self-pay | Admitting: *Deleted

## 2023-12-11 ENCOUNTER — Ambulatory Visit

## 2023-12-11 ENCOUNTER — Encounter

## 2023-12-11 ENCOUNTER — Other Ambulatory Visit

## 2023-12-11 ENCOUNTER — Ambulatory Visit: Admitting: Oncology

## 2023-12-12 ENCOUNTER — Other Ambulatory Visit: Payer: Self-pay | Admitting: Nurse Practitioner

## 2023-12-12 ENCOUNTER — Other Ambulatory Visit: Payer: Self-pay | Admitting: Pharmacist

## 2023-12-12 DIAGNOSIS — E1159 Type 2 diabetes mellitus with other circulatory complications: Secondary | ICD-10-CM

## 2023-12-12 DIAGNOSIS — F39 Unspecified mood [affective] disorder: Secondary | ICD-10-CM

## 2023-12-12 NOTE — Progress Notes (Signed)
 Brief Telephone Documentation Reason for Call: Patient called regarding inhaler cost concern  Summary of Call: Patient states that she has been using Spiriva  and Advair lately again because she forgot which inhaler the Breztri replaces.   Went to get a refill of Spiriva  and Advair at the pharmacy though they are too expensive for her.   We discussed that there are no patient assistance programs for advair since it has gone generic. We reviewed again that the Beztri has all three medicine type in it. She may resume the Breztri inhaler in place of all other inhalers since Lorraine is available to her for free.   She verbalizes understanding. Confirms she has three Breztri inhalers at home and recently received a message from [employer] that they are sending out her next refill.    Follow Up: Patient given direct line for further questions/concerns.  [name], PharmD Clinical Pharmacist [facility] [phone]

## 2023-12-12 NOTE — Telephone Encounter (Signed)
 Copied from CRM [id number]. Topic: Clinical - Medication Refill >> Dec 12, 2023  3:53 PM Pinkey ORN wrote: Medication: metoprolol  succinate (TOPROL -XL) 50 MG 24 hr tablet, buPROPion  (WELLBUTRIN  XL) 300 MG 24 hr tablet, cloNIDine  (CATAPRES ) 0.2 MG tablet  Has the patient contacted their pharmacy? Yes (Agent: If no, request that the patient contact the pharmacy for the refill. If patient does not wish to contact the pharmacy document the reason why and proceed with request.) (Agent: If yes, when and what did the pharmacy advise?)  This is the patient's preferred pharmacy:  [facility] [phone]  Is this the correct pharmacy for this prescription? No If no, delete pharmacy and type the correct one.   Has the prescription been filled recently? No  Is the patient out of the medication? Yes  Has the patient been seen for an appointment in the last year OR does the patient have an upcoming appointment? Yes  Can we respond through MyChart? Yes  Agent: Please be advised that Rx refills may take up to 3 business days. We ask that you follow-up with your pharmacy. >> Dec 12, 2023  3:57 PM Pinkey ORN wrote: Patient states she needs a 90-day supply for each medications.

## 2023-12-16 ENCOUNTER — Other Ambulatory Visit: Payer: Self-pay | Admitting: *Deleted

## 2023-12-16 ENCOUNTER — Other Ambulatory Visit

## 2023-12-16 DIAGNOSIS — D649 Anemia, unspecified: Secondary | ICD-10-CM

## 2023-12-16 MED ORDER — METOPROLOL SUCCINATE ER 50 MG PO TB24: 50.0000 mg | ORAL_TABLET | Freq: Every day | ORAL | 0 refills | Status: AC

## 2023-12-16 MED ORDER — CLONIDINE HCL 0.2 MG PO TABS
0.2000 mg | ORAL_TABLET | Freq: Two times a day (BID) | ORAL | 3 refills | Status: DC
Start: 2023-12-16 — End: 2024-01-07

## 2023-12-16 MED ORDER — BUPROPION HCL ER (XL) 300 MG PO TB24: 300.0000 mg | ORAL_TABLET | Freq: Every day | ORAL | 0 refills | Status: AC

## 2023-12-17 ENCOUNTER — Inpatient Hospital Stay

## 2023-12-17 ENCOUNTER — Inpatient Hospital Stay (HOSPITAL_BASED_OUTPATIENT_CLINIC_OR_DEPARTMENT_OTHER): Admitting: Oncology

## 2023-12-17 ENCOUNTER — Encounter: Payer: Self-pay | Admitting: Oncology

## 2023-12-17 ENCOUNTER — Encounter (INDEPENDENT_AMBULATORY_CARE_PROVIDER_SITE_OTHER): Payer: PPO

## 2023-12-17 ENCOUNTER — Ambulatory Visit (INDEPENDENT_AMBULATORY_CARE_PROVIDER_SITE_OTHER): Payer: PPO | Admitting: Vascular Surgery

## 2023-12-17 VITALS — BP 126/78

## 2023-12-17 VITALS — BP 128/59 | HR 52 | Temp 96.5°F | Resp 18 | Ht 62.0 in | Wt 168.0 lb

## 2023-12-17 DIAGNOSIS — C541 Malignant neoplasm of endometrium: Secondary | ICD-10-CM

## 2023-12-17 DIAGNOSIS — D649 Anemia, unspecified: Secondary | ICD-10-CM

## 2023-12-17 DIAGNOSIS — Z5111 Encounter for antineoplastic chemotherapy: Secondary | ICD-10-CM | POA: Diagnosis not present

## 2023-12-17 LAB — CMP (CANCER CENTER ONLY)
ALT: 11 U/L (ref 0–44)
AST: 17 U/L (ref 15–41)
Albumin: 2.9 g/dL — ABNORMAL LOW (ref 3.5–5.0)
Alkaline Phosphatase: 64 U/L (ref 38–126)
Anion gap: 7 (ref 5–15)
BUN: 41 mg/dL — ABNORMAL HIGH (ref 8–23)
CO2: 26 mmol/L (ref 22–32)
Calcium: 8.3 mg/dL — ABNORMAL LOW (ref 8.9–10.3)
Chloride: 104 mmol/L (ref 98–111)
Creatinine: 1.34 mg/dL — ABNORMAL HIGH (ref 0.44–1.00)
GFR, Estimated: 42 mL/min — ABNORMAL LOW (ref >60)
Glucose, Bld: 234 mg/dL — ABNORMAL HIGH (ref 70–99)
Potassium: 3.8 mmol/L (ref 3.5–5.1)
Sodium: 137 mmol/L (ref 135–145)
Total Bilirubin: 0.7 mg/dL (ref 0.0–1.2)
Total Protein: 5.2 g/dL — ABNORMAL LOW (ref 6.5–8.1)

## 2023-12-17 LAB — CBC WITH DIFFERENTIAL (CANCER CENTER ONLY)
Abs Immature Granulocytes: 0.01 K/uL (ref 0.00–0.07)
Basophils Absolute: 0 K/uL (ref 0.0–0.1)
Basophils Relative: 1 %
Eosinophils Absolute: 0.1 K/uL (ref 0.0–0.5)
Eosinophils Relative: 2 %
HCT: 26.8 % — ABNORMAL LOW (ref 36.0–46.0)
Hemoglobin: 8.5 g/dL — ABNORMAL LOW (ref 12.0–15.0)
Immature Granulocytes: 0 %
Lymphocytes Relative: 11 %
Lymphs Abs: 0.3 K/uL — ABNORMAL LOW (ref 0.7–4.0)
MCH: 27.3 pg (ref 26.0–34.0)
MCHC: 31.7 g/dL (ref 30.0–36.0)
MCV: 86.2 fL (ref 80.0–100.0)
Monocytes Absolute: 0.5 K/uL (ref 0.1–1.0)
Monocytes Relative: 17 %
Neutro Abs: 2 K/uL (ref 1.7–7.7)
Neutrophils Relative %: 69 %
Platelet Count: 181 K/uL (ref 150–400)
RBC: 3.11 MIL/uL — ABNORMAL LOW (ref 3.87–5.11)
RDW: 17.9 % — ABNORMAL HIGH (ref 11.5–15.5)
WBC Count: 2.9 K/uL — ABNORMAL LOW (ref 4.0–10.5)
nRBC: 0 % (ref 0.0–0.2)

## 2023-12-17 LAB — SAMPLE TO BLOOD BANK

## 2023-12-17 MED ORDER — SODIUM CHLORIDE 0.9 % IV SOLN
INTRAVENOUS | Status: DC
  Filled 2023-12-17: qty 250

## 2023-12-17 MED ORDER — DIPHENHYDRAMINE HCL 50 MG/ML IJ SOLN
25.0000 mg | Freq: Once | INTRAMUSCULAR | Status: AC
  Administered 2023-12-17: 25 mg via INTRAVENOUS
  Filled 2023-12-17: qty 1

## 2023-12-17 MED ORDER — SODIUM CHLORIDE 0.9 % IV SOLN
80.0000 mg/m2 | Freq: Once | INTRAVENOUS | Status: AC
  Administered 2023-12-17: 150 mg via INTRAVENOUS
  Filled 2023-12-17: qty 25

## 2023-12-17 MED ORDER — DEXAMETHASONE SODIUM PHOSPHATE 10 MG/ML IJ SOLN
10.0000 mg | Freq: Once | INTRAMUSCULAR | Status: AC
  Administered 2023-12-17: 10 mg via INTRAVENOUS
  Filled 2023-12-17: qty 1

## 2023-12-17 MED ORDER — FAMOTIDINE IN NACL 20-0.9 MG/50ML-% IV SOLN
20.0000 mg | Freq: Once | INTRAVENOUS | Status: AC
  Administered 2023-12-17: 20 mg via INTRAVENOUS
  Filled 2023-12-17: qty 50

## 2023-12-17 MED ORDER — SODIUM CHLORIDE 0.9 % IV SOLN
Freq: Once | INTRAVENOUS | Status: AC
  Filled 2023-12-17: qty 250

## 2023-12-17 NOTE — Progress Notes (Signed)
 [facility]  [phone]) [phone] [phone]) [phone]  ID: Terri [name] OB: [birth year]  MR#: [id number]  RDW#:[id number]  Patient Care Team: Patient, No Pcp Per as PCP - General (General Practice) Maurie Rayfield BIRCH, RN as Oncology Nurse Navigator Jacobo Evalene PARAS, MD as Consulting Physician (Oncology) Kurtis Verdel Adler, MD as Referring Physician (Ophthalmology) [facility] Isadora Hose, MD as Consulting Physician (Pulmonary Disease) Geronimo Manuelita SAUNDERS, [facility] as Pharmacist (Pharmacist)  CHIEF COMPLAINT: 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  INTERVAL HISTORY: Patient returns to clinic today for further evaluation and consideration of cycle 6, day 1 of single agent Taxol .  She continues to have chronic weakness and fatigue, but otherwise feels well. She does not report any falls this week.  She has a chronic peripheral neuropathy, but no other neurologic complaints.  Her vaginal bleeding has improved, but still evident.  She denies any recent fevers or illnesses.  She has no chest pain, shortness of breath, cough, or hemoptysis.  She denies any nausea, vomiting, constipation, or diarrhea.  She has no urinary complaints.  Patient offers no further specific complaints today.  REVIEW OF SYSTEMS:   Review of Systems  Constitutional:  Positive for malaise/fatigue. Negative for fever and weight loss.  Respiratory: Negative.  Negative for cough, hemoptysis and shortness of breath.   Cardiovascular: Negative.  Negative for chest pain and leg swelling.  Gastrointestinal: Negative.  Negative for abdominal pain, blood in stool and melena.  Genitourinary: Negative.  Negative for dysuria.  Musculoskeletal:  Positive for falls. Negative for back pain.  Skin: Negative.  Negative for rash.  Neurological:  Positive for sensory change and weakness. Negative for dizziness, focal weakness and headaches.  Psychiatric/Behavioral:  Negative.  The patient is not nervous/anxious.     As per HPI. Otherwise, a complete review of systems is negative.  PAST MEDICAL HISTORY: Past Medical History:  Diagnosis Date   Adenomatous colon polyp    Aortic stenosis    Arthritis    Asthma    Cancer (HCC)    Claudication (HCC)    COPD (chronic obstructive pulmonary disease) (HCC)    Depression    Diabetes mellitus without complication (HCC)    Esophageal dysphagia    GERD (gastroesophageal reflux disease)    Hyperlipemia    Hypertension    Obesity    Severe obesity (BMI 35.0-35.9 with comorbidity) (HCC)     PAST SURGICAL HISTORY: Past Surgical History:  Procedure Laterality Date   BLADDER SURGERY     COLONOSCOPY WITH PROPOFOL  N/A 08/05/2015   Procedure: COLONOSCOPY WITH PROPOFOL ;  Surgeon: Donnice Vaughn Manes, MD;  Location: [facility] ENDOSCOPY;  Service: Endoscopy;  Laterality: N/A;   ESOPHAGOGASTRODUODENOSCOPY N/A 01/09/2022   Procedure: ESOPHAGOGASTRODUODENOSCOPY (EGD);  Surgeon: Maryruth Ole DASEN, MD;  Location: [facility] ENDOSCOPY;  Service: Endoscopy;  Laterality: N/A;   FOOT SURGERY Left    FRACTURE SURGERY     IR CV LINE INJECTION  06/22/2022   IR IMAGING GUIDED PORT INSERTION  06/05/2022   IR PORT REPAIR CENTRAL VENOUS ACCESS DEVICE  07/02/2022   LOWER EXTREMITY ANGIOGRAPHY Left 02/04/2023   Procedure: Lower Extremity Angiography;  Surgeon: Marea Selinda RAMAN, MD;  Location: [facility] INVASIVE CV LAB;  Service: Cardiovascular;  Laterality: Left;   ORIF ANKLE FRACTURE Left 03/26/2017   Procedure: OPEN REDUCTION INTERNAL FIXATION (ORIF) ANKLE FRACTURE;  Surgeon: Edie Norleen PARAS, MD;  Location: [facility] ORS;  Service: Orthopedics;  Laterality: Left;    FAMILY HISTORY: Family History  Problem Relation  Age of Onset   CVA Mother    Diabetes Mother    CAD Father    Breast cancer Neg Hx     ADVANCED DIRECTIVES (Y/N):  N  HEALTH MAINTENANCE: Social History   Tobacco Use   Smoking status: Former    Current packs/day: 1.00    Average  packs/day: 1 pack/day for 40.0 years (40.0 ttl pk-yrs)    Types: Cigarettes   Smokeless tobacco: Never   Tobacco comments:    Quite 2015  Vaping Use   Vaping status: Never Used  Substance Use Topics   Alcohol use: No   Drug use: No     Colonoscopy:  PAP:  Bone density:  Lipid panel:  No Known Allergies  Current Outpatient Medications  Medication Sig Dispense Refill   albuterol  (VENTOLIN  HFA) 108 (90 Base) MCG/ACT inhaler Inhale 2 puffs into the lungs every 6 (six) hours as needed for wheezing or shortness of breath. 1 each 1   amLODipine  (NORVASC ) 10 MG tablet Take 1 tablet (10 mg total) by mouth daily. 90 tablet 3   aspirin  EC 81 MG tablet Take 1 tablet (81 mg total) by mouth daily. Swallow whole. 150 tablet 2   atorvastatin  (LIPITOR) 80 MG tablet Take 1 tablet (80 mg total) by mouth daily. 90 tablet 3   budesonide -glycopyrrolate-formoterol  (BREZTRI) 160-9-4.8 MCG/ACT AERO inhaler Inhale 2 puffs into the lungs 2 (two) times daily. Via PAP AZ&Me     buPROPion  (WELLBUTRIN  XL) 300 MG 24 hr tablet Take 1 tablet (300 mg total) by mouth daily. 90 tablet 0   Cholecalciferol  (VITAMIN D3) 50 MCG (2000 UT) capsule Take 2,000 Units by mouth daily.     citalopram  (CELEXA ) 20 MG tablet Take 1 tablet (20 mg total) by mouth daily. 90 tablet 3   cloNIDine  (CATAPRES ) 0.2 MG tablet Take 1 tablet (0.2 mg total) by mouth 2 (two) times daily. 180 tablet 3   clopidogrel  (PLAVIX ) 75 MG tablet Take 1 tablet (75 mg total) by mouth daily. 90 tablet 3   cyanocobalamin  (VITAMIN B12) 1000 MCG tablet Take 1 tablet (1,000 mcg total) by mouth daily. 90 tablet 3   gabapentin (NEURONTIN) 100 MG capsule Take 100 mg by mouth 2 (two) times daily.     insulin  aspart (NOVOLOG ) 100 UNIT/ML injection Inject 5 Units into the skin 3 (three) times daily before meals.     insulin  degludec (TRESIBA FLEXTOUCH) 100 UNIT/ML FlexTouch Pen Inject 20 Units into the skin daily. 20 units once daily - REPLACES Levemir      Iron ,  Ferrous Sulfate , 325 (65 Fe) MG TABS Take 1 tablet by mouth daily. 90 tablet 3   losartan -hydrochlorothiazide  (HYZAAR) 100-25 MG tablet Take 1 tablet by mouth daily. 90 tablet 3   megestrol  (MEGACE ) 40 MG tablet Take 1 tablet (40 mg total) by mouth daily. 30 tablet 2   metoprolol  succinate (TOPROL -XL) 50 MG 24 hr tablet Take 1 tablet (50 mg total) by mouth daily. 90 tablet 0   montelukast  (SINGULAIR ) 10 MG tablet Take 10 mg by mouth at bedtime.     ondansetron  (ZOFRAN ) 8 MG tablet Take 1 tablet (8 mg total) by mouth every 8 (eight) hours as needed for nausea or vomiting. 30 tablet 0   ONETOUCH VERIO test strip 1 each by Other route 3 (three) times daily. 100 each 10   No current facility-administered medications for this visit.   Facility-Administered Medications Ordered in Other Visits  Medication Dose Route Frequency Provider Last Rate Last Admin  0.9 %  sodium chloride  infusion   Intravenous Continuous Marva Hendryx J, MD 10 mL/hr at 12/17/23 1024 New Bag at 12/17/23 1024    OBJECTIVE: Vitals:   12/17/23 0925  BP: (!) 128/59  Pulse: (!) 52  Resp: 18  Temp: (!) 96.5 F (35.8 C)  SpO2: 100%       Body mass index is 30.73 kg/m.    ECOG FS:1 - Symptomatic but completely ambulatory  General: Well-developed, well-nourished, no acute distress.  Sitting in a wheelchair. Eyes: Pink conjunctiva, anicteric sclera. HEENT: Normocephalic, moist mucous membranes. Lungs: No audible wheezing or coughing. Heart: Regular rate and rhythm. Abdomen: Soft, nontender, no obvious distention. Musculoskeletal: No edema, cyanosis, or clubbing. Neuro: Alert, answering all questions appropriately. Cranial nerves grossly intact. Skin: No rashes or petechiae noted. Psych: Normal affect.  LAB RESULTS:  Lab Results  Component Value Date   NA 137 12/17/2023   K 3.8 12/17/2023   CL 104 12/17/2023   CO2 26 12/17/2023   GLUCOSE 234 (H) 12/17/2023   BUN 41 (H) 12/17/2023   CREATININE 1.34 (H)  12/17/2023   CALCIUM  8.3 (L) 12/17/2023   PROT 5.2 (L) 12/17/2023   ALBUMIN 2.9 (L) 12/17/2023   AST 17 12/17/2023   ALT 11 12/17/2023   ALKPHOS 64 12/17/2023   BILITOT 0.7 12/17/2023   GFRNONAA 42 (L) 12/17/2023   GFRAA >60 03/27/2017    Lab Results  Component Value Date   WBC 2.9 (L) 12/17/2023   NEUTROABS 2.0 12/17/2023   HGB 8.5 (L) 12/17/2023   HCT 26.8 (L) 12/17/2023   MCV 86.2 12/17/2023   PLT 181 12/17/2023     STUDIES: No results found.  ONCOLOGY HISTORY: Patient was initially hesitant to undergo chemotherapy and elected to do XRT only which was completed on April 18, 2022.  CT scan results from October 08, 2022 reviewed independently with no obvious evidence of progressive disease. She completed 6 cycles of carboplatinum, Taxol , and Keytruda  on October 03, 2022.  Patient then initiated maintenance Keytruda  on October 23, 2022.  PET scan results from January 15, 2023 reviewed independently with mild nonspecific residual hypermetabolism in the lower uterine segment as well as small bilateral common iliac nodes possibly suggesting nodal metastasis.  Patient was seen by gynecology oncology who determined surgical intervention is not an option.  They recommend discontinuing Keytruda  for progressive disease and initiating single agent Doxil  every 28 days.  Patient received 3 cycles of Doxil  before progression of disease her last dose was given on June 05, 2023.  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  Repeat PET scan on October 15, 2023 reviewed independently with significant improvement of disease burden.  Internal exam by gynecology oncology also confirmed disease improvement.  Proceed with cycle 6, day 1 of single agent Taxol  today.  Return to clinic in 1 week for further evaluation and consideration of cycle 6, day 8.  Plan to  reimage after the conclusion of cycle 6.  If residual disease at that time, can consider internal brachytherapy at [facility].   Port: Port revision successful.  Proceed with treatment as above. Hyperglycemia: Chronic and unchanged.  Patient continues to have poor blood glucose control. Renal insufficiency: Creatinine remains slightly elevated at 1.34.  Patient will receive an additional 1 L of IV fluids today. Leukopenia: Total white blood cell count is 2.9.  Proceed cautiously with treatment  as above. Anemia: Hemoglobin trended down to 8.5, monitor. Vaginal bleeding: Improved.  Continue follow-up with gynecology oncology as needed.   Claudication symptoms: Resolved.  Patient underwent revascularization procedure on February 04, 2023. Right hamstring tendon rupture: Patient reports there is no plan for surgical repair. Rash: Resolved.  Likely secondary to Doxil  which has been discontinued.   Poor appetite/weight loss: Continue Megace  as prescribed.  Patient refuses referral to dietary.   Neuropathy/dizziness: Chronic and unchanged.  Continue follow-up with neurology as recommended.   Falls: Intermittent.  Patient now has 2 walkers at her home upstairs and downstairs.  She admits to only using them occasionally.  Patient expressed understanding and was in agreement with this plan. She also understands that She can call clinic at any time with any questions, concerns, or complaints.    Cancer Staging  Endometrial cancer [facility]) Staging form: Corpus Uteri - Carcinoma and Carcinosarcoma, AJCC 8th Edition - Clinical stage from 05/30/2022: FIGO Stage IIIB (cT3b, cN0, cM0) - Signed by Jacobo Evalene PARAS, MD on 05/30/2022 Stage prefix: Initial diagnosis   Evalene PARAS Jacobo, MD   12/17/2023 1:16 PM

## 2023-12-17 NOTE — Progress Notes (Signed)
 Patient said that she has only 5 more treatments to go and would like to know what happens after that? She is eating a little better.

## 2023-12-17 NOTE — Patient Instructions (Signed)
 [facility] - A DEPT OF [facility]  Discharge Instructions: Thank you for choosing [facility] to provide your oncology and hematology care.  If you have a lab appointment with the Cancer Center, please go directly to the Cancer Center and check in at the registration area.  Wear comfortable clothing and clothing appropriate for easy access to any Portacath or PICC line.   We strive to give you quality time with your provider. You may need to reschedule your appointment if you arrive late (15 or more minutes).  Arriving late affects you and other patients whose appointments are after yours.  Also, if you miss three or more appointments without notifying the office, you may be dismissed from the clinic at the provider's discretion.      For prescription refill requests, have your pharmacy contact our office and allow 72 hours for refills to be completed.    Today you received the following chemotherapy and/or immunotherapy agents TAXOL  and 1 Liter fluid       To help prevent nausea and vomiting after your treatment, we encourage you to take your nausea medication as directed.  BELOW ARE SYMPTOMS THAT SHOULD BE REPORTED IMMEDIATELY: *FEVER GREATER THAN 100.4 F (38 C) OR HIGHER *CHILLS OR SWEATING *NAUSEA AND VOMITING THAT IS NOT CONTROLLED WITH YOUR NAUSEA MEDICATION *UNUSUAL SHORTNESS OF BREATH *UNUSUAL BRUISING OR BLEEDING *URINARY PROBLEMS (pain or burning when urinating, or frequent urination) *BOWEL PROBLEMS (unusual diarrhea, constipation, pain near the anus) TENDERNESS IN MOUTH AND THROAT WITH OR WITHOUT PRESENCE OF ULCERS (sore throat, sores in mouth, or a toothache) UNUSUAL RASH, SWELLING OR PAIN  UNUSUAL VAGINAL DISCHARGE OR ITCHING   Items with * indicate a potential emergency and should be followed up as soon as possible or go to the Emergency Department if any problems should occur.  Please show the CHEMOTHERAPY ALERT CARD or  IMMUNOTHERAPY ALERT CARD at check-in to the Emergency Department and triage nurse.  Should you have questions after your visit or need to cancel or reschedule your appointment, please contact [facility] - A DEPT OF [facility]  [phone] and follow the prompts.  Office hours are 8:00 a.m. to 4:30 p.m. Monday - Friday. Please note that voicemails left after 4:00 p.m. may not be returned until the following business day.  We are closed weekends and major holidays. You have access to a nurse at all times for urgent questions. Please call the main number to the clinic [phone] and follow the prompts.  For any non-urgent questions, you may also contact your provider using MyChart. We now offer e-Visits for anyone 74 and older to request care online for non-urgent symptoms. For details visit [email or web address].   Also download the MyChart app! Go to the app store, search MyChart, open the app, select [place], and log in with your MyChart username and password.   Paclitaxel  Injection What is this medication? PACLITAXEL  (PAK li TAX el) treats some types of cancer. It works by slowing down the growth of cancer cells. This medicine may be used for other purposes; ask your health care provider or pharmacist if you have questions. COMMON BRAND NAME(S): Onxol, Taxol  What should I tell my care team before I take this medication? They need to know if you have any of these conditions: Heart disease Liver disease Low white blood cell levels An unusual or allergic reaction to paclitaxel , other medications, foods,  dyes, or preservatives If you or your partner are pregnant or trying to get pregnant Breast-feeding How should I use this medication? This medication is injected into a vein. It is given by your care team in a hospital or clinic setting. Talk to your care team about the use of this medication in children. While it may be given to children for  selected conditions, precautions do apply. Overdosage: If you think you have taken too much of this medicine contact a poison control center or emergency room at once. NOTE: This medicine is only for you. Do not share this medicine with others. What if I miss a dose? Keep appointments for follow-up doses. It is important not to miss your dose. Call your care team if you are unable to keep an appointment. What may interact with this medication? Do not take this medication with any of the following: Live virus vaccines Other medications may affect the way this medication works. Talk with your care team about all of the medications you take. They may suggest changes to your treatment plan to lower the risk of side effects and to make sure your medications work as intended. This list may not describe all possible interactions. Give your health care provider a list of all the medicines, herbs, non-prescription drugs, or dietary supplements you use. Also tell them if you smoke, drink alcohol, or use illegal drugs. Some items may interact with your medicine. What should I watch for while using this medication? Your condition will be monitored carefully while you are receiving this medication. You may need blood work while taking this medication. This medication may make you feel generally unwell. This is not uncommon as chemotherapy can affect healthy cells as well as cancer cells. Report any side effects. Continue your course of treatment even though you feel ill unless your care team tells you to stop. This medication can cause serious allergic reactions. To reduce the risk, your care team may give you other medications to take before receiving this one. Be sure to follow the directions from your care team. This medication may increase your risk of getting an infection. Call your care team for advice if you get a fever, chills, sore throat, or other symptoms of a cold or flu. Do not treat yourself. Try to  avoid being around people who are sick. This medication may increase your risk to bruise or bleed. Call your care team if you notice any unusual bleeding. Be careful brushing or flossing your teeth or using a toothpick because you may get an infection or bleed more easily. If you have any dental work done, tell your dentist you are receiving this medication. Talk to your care team if you may be pregnant. Serious birth defects can occur if you take this medication during pregnancy. Talk to your care team before breastfeeding. Changes to your treatment plan may be needed. What side effects may I notice from receiving this medication? Side effects that you should report to your care team as soon as possible: Allergic reactions--skin rash, itching, hives, swelling of the face, lips, tongue, or throat Heart rhythm changes--fast or irregular heartbeat, dizziness, feeling faint or lightheaded, chest pain, trouble breathing Increase in blood pressure Infection--fever, chills, cough, sore throat, wounds that don't heal, pain or trouble when passing urine, general feeling of discomfort or being unwell Low blood pressure--dizziness, feeling faint or lightheaded, blurry vision Low red blood cell level--unusual weakness or fatigue, dizziness, headache, trouble breathing Painful swelling, warmth, or redness of  the skin, blisters or sores at the infusion site Pain, tingling, or numbness in the hands or feet Slow heartbeat--dizziness, feeling faint or lightheaded, confusion, trouble breathing, unusual weakness or fatigue Unusual bruising or bleeding Side effects that usually do not require medical attention (report to your care team if they continue or are bothersome): Diarrhea Hair loss Joint pain Loss of appetite Muscle pain Nausea Vomiting This list may not describe all possible side effects. Call your doctor for medical advice about side effects. You may report side effects to FDA at  1-800-FDA-1088. Where should I keep my medication? This medication is given in a hospital or clinic. It will not be stored at home. NOTE: This sheet is a summary. It may not cover all possible information. If you have questions about this medicine, talk to your doctor, pharmacist, or health care provider.  2024 Elsevier/Gold Standard (2021-09-05 00:00:00)

## 2023-12-19 ENCOUNTER — Ambulatory Visit

## 2023-12-24 ENCOUNTER — Other Ambulatory Visit: Payer: Self-pay | Admitting: *Deleted

## 2023-12-24 ENCOUNTER — Ambulatory Visit

## 2023-12-24 DIAGNOSIS — D649 Anemia, unspecified: Secondary | ICD-10-CM

## 2023-12-24 DIAGNOSIS — C541 Malignant neoplasm of endometrium: Secondary | ICD-10-CM

## 2023-12-25 ENCOUNTER — Inpatient Hospital Stay

## 2023-12-25 ENCOUNTER — Encounter: Payer: Self-pay | Admitting: Oncology

## 2023-12-25 ENCOUNTER — Inpatient Hospital Stay (HOSPITAL_BASED_OUTPATIENT_CLINIC_OR_DEPARTMENT_OTHER): Admitting: Oncology

## 2023-12-25 VITALS — BP 136/59 | HR 68 | Temp 97.3°F | Resp 16 | Wt 157.4 lb

## 2023-12-25 VITALS — BP 157/60 | HR 66 | Temp 96.7°F | Resp 19

## 2023-12-25 DIAGNOSIS — C541 Malignant neoplasm of endometrium: Secondary | ICD-10-CM

## 2023-12-25 DIAGNOSIS — Z5111 Encounter for antineoplastic chemotherapy: Secondary | ICD-10-CM | POA: Diagnosis not present

## 2023-12-25 DIAGNOSIS — D649 Anemia, unspecified: Secondary | ICD-10-CM

## 2023-12-25 LAB — CMP (CANCER CENTER ONLY)
ALT: 13 U/L (ref 0–44)
AST: 18 U/L (ref 15–41)
Albumin: 3.1 g/dL — ABNORMAL LOW (ref 3.5–5.0)
Alkaline Phosphatase: 67 U/L (ref 38–126)
Anion gap: 7 (ref 5–15)
BUN: 20 mg/dL (ref 8–23)
CO2: 28 mmol/L (ref 22–32)
Calcium: 8.6 mg/dL — ABNORMAL LOW (ref 8.9–10.3)
Chloride: 105 mmol/L (ref 98–111)
Creatinine: 0.94 mg/dL (ref 0.44–1.00)
GFR, Estimated: >60 mL/min (ref >60)
Glucose, Bld: 248 mg/dL — ABNORMAL HIGH (ref 70–99)
Potassium: 3.8 mmol/L (ref 3.5–5.1)
Sodium: 140 mmol/L (ref 135–145)
Total Bilirubin: 0.8 mg/dL (ref 0.0–1.2)
Total Protein: 5.3 g/dL — ABNORMAL LOW (ref 6.5–8.1)

## 2023-12-25 LAB — CBC WITH DIFFERENTIAL (CANCER CENTER ONLY)
Abs Immature Granulocytes: 0.05 K/uL (ref 0.00–0.07)
Basophils Absolute: 0 K/uL (ref 0.0–0.1)
Basophils Relative: 0 %
Eosinophils Absolute: 0.1 K/uL (ref 0.0–0.5)
Eosinophils Relative: 2 %
HCT: 26.7 % — ABNORMAL LOW (ref 36.0–46.0)
Hemoglobin: 8.3 g/dL — ABNORMAL LOW (ref 12.0–15.0)
Immature Granulocytes: 1 %
Lymphocytes Relative: 7 %
Lymphs Abs: 0.2 K/uL — ABNORMAL LOW (ref 0.7–4.0)
MCH: 26.9 pg (ref 26.0–34.0)
MCHC: 31.1 g/dL (ref 30.0–36.0)
MCV: 86.4 fL (ref 80.0–100.0)
Monocytes Absolute: 0.3 K/uL (ref 0.1–1.0)
Monocytes Relative: 7 %
Neutro Abs: 2.9 K/uL (ref 1.7–7.7)
Neutrophils Relative %: 83 %
Platelet Count: 162 K/uL (ref 150–400)
RBC: 3.09 MIL/uL — ABNORMAL LOW (ref 3.87–5.11)
RDW: 17.6 % — ABNORMAL HIGH (ref 11.5–15.5)
WBC Count: 3.6 K/uL — ABNORMAL LOW (ref 4.0–10.5)
nRBC: 0.6 % — ABNORMAL HIGH (ref 0.0–0.2)

## 2023-12-25 LAB — SAMPLE TO BLOOD BANK

## 2023-12-25 MED ORDER — FAMOTIDINE IN NACL 20-0.9 MG/50ML-% IV SOLN
20.0000 mg | Freq: Once | INTRAVENOUS | Status: AC
  Administered 2023-12-25: 20 mg via INTRAVENOUS
  Filled 2023-12-25: qty 50

## 2023-12-25 MED ORDER — DEXAMETHASONE SODIUM PHOSPHATE 10 MG/ML IJ SOLN
10.0000 mg | Freq: Once | INTRAMUSCULAR | Status: AC
  Administered 2023-12-25: 10 mg via INTRAVENOUS
  Filled 2023-12-25: qty 1

## 2023-12-25 MED ORDER — DIPHENHYDRAMINE HCL 50 MG/ML IJ SOLN
25.0000 mg | Freq: Once | INTRAMUSCULAR | Status: AC
  Administered 2023-12-25: 25 mg via INTRAVENOUS
  Filled 2023-12-25: qty 1

## 2023-12-25 MED ORDER — SODIUM CHLORIDE 0.9 % IV SOLN
80.0000 mg/m2 | Freq: Once | INTRAVENOUS | Status: AC
  Administered 2023-12-25: 150 mg via INTRAVENOUS
  Filled 2023-12-25: qty 25

## 2023-12-25 MED ORDER — SODIUM CHLORIDE 0.9 % IV SOLN
INTRAVENOUS | Status: DC
  Filled 2023-12-25: qty 250

## 2023-12-25 NOTE — Progress Notes (Unsigned)
 [facility]  [phone] Fax:([phone]  ID: [name] OB: [birth year]  MR#: [id number]  RDW#:[id number]  Patient Care Team: Patient, No Pcp Per as PCP - General (General Practice) Maurie Rayfield BIRCH, RN as Oncology Nurse Navigator Jacobo Evalene PARAS, MD as Consulting Physician (Oncology) Kurtis Verdel Adler, MD as Referring Physician (Ophthalmology) [facility] Isadora Hose, MD as Consulting Physician (Pulmonary Disease) Geronimo Manuelita SAUNDERS, [facility] as Pharmacist (Pharmacist)  CHIEF COMPLAINT: 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  INTERVAL HISTORY: Patient returns to clinic today for further evaluation and consideration of cycle 6, day 8 of single agent Taxol .  She continues to have chronic weakness and fatigue, but otherwise feels well.  She is tolerating her treatments without significant side effects.  She has a chronic peripheral neuropathy, but no other neurologic complaints.  Her vaginal bleeding has improved, but still evident.  She denies any recent fevers or illnesses.  She has no chest pain, shortness of breath, cough, or hemoptysis.  She denies any nausea, vomiting, constipation, or diarrhea.  She has no urinary complaints.  Patient offers no further specific complaints today.  REVIEW OF SYSTEMS:   Review of Systems  Constitutional:  Positive for malaise/fatigue. Negative for fever and weight loss.  Respiratory: Negative.  Negative for cough, hemoptysis and shortness of breath.   Cardiovascular: Negative.  Negative for chest pain and leg swelling.  Gastrointestinal: Negative.  Negative for abdominal pain, blood in stool and melena.  Genitourinary: Negative.  Negative for dysuria.  Musculoskeletal:  Positive for falls. Negative for back pain.  Skin: Negative.  Negative for rash.  Neurological:  Positive for sensory change and weakness. Negative for dizziness, focal weakness and headaches.   Psychiatric/Behavioral: Negative.  The patient is not nervous/anxious.     As per HPI. Otherwise, a complete review of systems is negative.  PAST MEDICAL HISTORY: Past Medical History:  Diagnosis Date   Adenomatous colon polyp    Aortic stenosis    Arthritis    Asthma    Cancer (HCC)    Claudication (HCC)    COPD (chronic obstructive pulmonary disease) (HCC)    Depression    Diabetes mellitus without complication (HCC)    Esophageal dysphagia    GERD (gastroesophageal reflux disease)    Hyperlipemia    Hypertension    Obesity    Severe obesity (BMI 35.0-35.9 with comorbidity) (HCC)     PAST SURGICAL HISTORY: Past Surgical History:  Procedure Laterality Date   BLADDER SURGERY     COLONOSCOPY WITH PROPOFOL  N/A 08/05/2015   Procedure: COLONOSCOPY WITH PROPOFOL ;  Surgeon: Donnice Vaughn Manes, MD;  Location: [facility] ENDOSCOPY;  Service: Endoscopy;  Laterality: N/A;   ESOPHAGOGASTRODUODENOSCOPY N/A 01/09/2022   Procedure: ESOPHAGOGASTRODUODENOSCOPY (EGD);  Surgeon: Maryruth Ole DASEN, MD;  Location: [facility] ENDOSCOPY;  Service: Endoscopy;  Laterality: N/A;   FOOT SURGERY Left    FRACTURE SURGERY     IR CV LINE INJECTION  06/22/2022   IR IMAGING GUIDED PORT INSERTION  06/05/2022   IR PORT REPAIR CENTRAL VENOUS ACCESS DEVICE  07/02/2022   LOWER EXTREMITY ANGIOGRAPHY Left 02/04/2023   Procedure: Lower Extremity Angiography;  Surgeon: Marea Selinda RAMAN, MD;  Location: [facility] INVASIVE CV LAB;  Service: Cardiovascular;  Laterality: Left;   ORIF ANKLE FRACTURE Left 03/26/2017   Procedure: OPEN REDUCTION INTERNAL FIXATION (ORIF) ANKLE FRACTURE;  Surgeon: Edie Norleen PARAS, MD;  Location: [facility] ORS;  Service: Orthopedics;  Laterality: Left;    FAMILY HISTORY: Family History  Problem Relation Age of Onset   CVA Mother    Diabetes Mother    CAD Father    Breast cancer Neg Hx     ADVANCED DIRECTIVES (Y/N):  N  HEALTH MAINTENANCE: Social History   Tobacco Use   Smoking status: Former    Current  packs/day: 1.00    Average packs/day: 1 pack/day for 40.0 years (40.0 ttl pk-yrs)    Types: Cigarettes   Smokeless tobacco: Never   Tobacco comments:    Quite 2015  Vaping Use   Vaping status: Never Used  Substance Use Topics   Alcohol use: No   Drug use: No     Colonoscopy:  PAP:  Bone density:  Lipid panel:  No Known Allergies  Current Outpatient Medications  Medication Sig Dispense Refill   albuterol  (VENTOLIN  HFA) 108 (90 Base) MCG/ACT inhaler Inhale 2 puffs into the lungs every 6 (six) hours as needed for wheezing or shortness of breath. 1 each 1   amLODipine  (NORVASC ) 10 MG tablet Take 1 tablet (10 mg total) by mouth daily. 90 tablet 3   aspirin  EC 81 MG tablet Take 1 tablet (81 mg total) by mouth daily. Swallow whole. 150 tablet 2   atorvastatin  (LIPITOR) 80 MG tablet Take 1 tablet (80 mg total) by mouth daily. 90 tablet 3   budesonide -glycopyrrolate-formoterol  (BREZTRI) 160-9-4.8 MCG/ACT AERO inhaler Inhale 2 puffs into the lungs 2 (two) times daily. Via PAP AZ&Me     buPROPion  (WELLBUTRIN  XL) 300 MG 24 hr tablet Take 1 tablet (300 mg total) by mouth daily. 90 tablet 0   Cholecalciferol  (VITAMIN D3) 50 MCG (2000 UT) capsule Take 2,000 Units by mouth daily.     citalopram  (CELEXA ) 20 MG tablet Take 1 tablet (20 mg total) by mouth daily. 90 tablet 3   cloNIDine  (CATAPRES ) 0.2 MG tablet Take 1 tablet (0.2 mg total) by mouth 2 (two) times daily. 180 tablet 3   clopidogrel  (PLAVIX ) 75 MG tablet Take 1 tablet (75 mg total) by mouth daily. 90 tablet 3   cyanocobalamin  (VITAMIN B12) 1000 MCG tablet Take 1 tablet (1,000 mcg total) by mouth daily. 90 tablet 3   gabapentin (NEURONTIN) 100 MG capsule Take 100 mg by mouth 2 (two) times daily.     insulin  aspart (NOVOLOG ) 100 UNIT/ML injection Inject 5 Units into the skin 3 (three) times daily before meals.     insulin  degludec (TRESIBA FLEXTOUCH) 100 UNIT/ML FlexTouch Pen Inject 20 Units into the skin daily. 20 units once daily -  REPLACES Levemir      Iron , Ferrous Sulfate , 325 (65 Fe) MG TABS Take 1 tablet by mouth daily. 90 tablet 3   losartan -hydrochlorothiazide  (HYZAAR) 100-25 MG tablet Take 1 tablet by mouth daily. 90 tablet 3   megestrol  (MEGACE ) 40 MG tablet Take 1 tablet (40 mg total) by mouth daily. 30 tablet 2   metoprolol  succinate (TOPROL -XL) 50 MG 24 hr tablet Take 1 tablet (50 mg total) by mouth daily. 90 tablet 0   montelukast  (SINGULAIR ) 10 MG tablet Take 10 mg by mouth at bedtime.     ondansetron  (ZOFRAN ) 8 MG tablet Take 1 tablet (8 mg total) by mouth every 8 (eight) hours as needed for nausea or vomiting. 30 tablet 0   ONETOUCH VERIO test strip 1 each by Other route 3 (three) times daily. 100 each 10   No current facility-administered medications for this visit.    OBJECTIVE: Vitals:   12/25/23 0943  BP: (!) 136/59  Pulse: 68  Resp: 16  Temp: (!) 97.3 F (36.3 C)  SpO2: 99%       Body mass index is 28.79 kg/m.    ECOG FS:1 - Symptomatic but completely ambulatory  General: Well-developed, well-nourished, no acute distress.  Sitting in a wheelchair. Eyes: Pink conjunctiva, anicteric sclera. HEENT: Normocephalic, moist mucous membranes. Lungs: No audible wheezing or coughing. Heart: Regular rate and rhythm. Abdomen: Soft, nontender, no obvious distention. Musculoskeletal: No edema, cyanosis, or clubbing. Neuro: Alert, answering all questions appropriately. Cranial nerves grossly intact. Skin: No rashes or petechiae noted. Psych: Normal affect.   LAB RESULTS:  Lab Results  Component Value Date   NA 140 12/25/2023   K 3.8 12/25/2023   CL 105 12/25/2023   CO2 28 12/25/2023   GLUCOSE 248 (H) 12/25/2023   BUN 20 12/25/2023   CREATININE 0.94 12/25/2023   CALCIUM  8.6 (L) 12/25/2023   PROT 5.3 (L) 12/25/2023   ALBUMIN 3.1 (L) 12/25/2023   AST 18 12/25/2023   ALT 13 12/25/2023   ALKPHOS 67 12/25/2023   BILITOT 0.8 12/25/2023   GFRNONAA >60 12/25/2023   GFRAA >60 03/27/2017     Lab Results  Component Value Date   WBC 3.6 (L) 12/25/2023   NEUTROABS 2.9 12/25/2023   HGB 8.3 (L) 12/25/2023   HCT 26.7 (L) 12/25/2023   MCV 86.4 12/25/2023   PLT 162 12/25/2023     STUDIES: No results found.  ONCOLOGY HISTORY: Patient was initially hesitant to undergo chemotherapy and elected to do XRT only which was completed on April 18, 2022.  CT scan results from October 08, 2022 reviewed independently with no obvious evidence of progressive disease. She completed 6 cycles of carboplatinum, Taxol , and Keytruda  on October 03, 2022.  Patient then initiated maintenance Keytruda  on October 23, 2022.  PET scan results from January 15, 2023 reviewed independently with mild nonspecific residual hypermetabolism in the lower uterine segment as well as small bilateral common iliac nodes possibly suggesting nodal metastasis.  Patient was seen by gynecology oncology who determined surgical intervention is not an option.  They recommend discontinuing Keytruda  for progressive disease and initiating single agent Doxil  every 28 days.  Patient received 3 cycles of Doxil  before progression of disease her last dose was given on June 05, 2023.  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  Repeat PET scan on October 15, 2023 reviewed independently with significant improvement of disease burden.  Internal exam by gynecology oncology also confirmed disease improvement.  Proceed with cycle 6, day 8 of single agent Taxol  today.  Return to clinic in 1 week for further evaluation and consideration of cycle 6, day 15.  Plan to reimage after the conclusion of cycle 6.  If residual disease at that time, can consider internal brachytherapy at [facility].   Port: Port revision successful.  Proceed with treatment as above. Hyperglycemia: Chronic and unchanged.   Patient continues to have poor blood glucose control. Renal insufficiency: Resolved.  Patient's creatinine is within normal limits today. Leukopenia: Mildly improved.  Patient's total white blood cell count was 3.6.   Anemia: Chronic and unchanged.  Patient's hemoglobin is 8.3 today.   Vaginal bleeding: Improved.  Continue follow-up with gynecology oncology as needed.   Claudication symptoms: Resolved.  Patient underwent revascularization procedure on February 04, 2023. Right hamstring tendon rupture: Patient reports there is no plan for surgical repair. Rash: Resolved.  Likely secondary to Doxil  which has been discontinued.   Poor appetite/weight loss: Continue Megace  as prescribed.  Patient refuses referral to dietary.   Peripheral neuropathy: Chronic and unchanged.  Continue follow-up with neurology as recommended.   Falls: Intermittent.  Patient now has 2 walkers at her home upstairs and downstairs.  She admits to only using them occasionally.  Patient expressed understanding and was in agreement with this plan. She also understands that She can call clinic at any time with any questions, concerns, or complaints.    Cancer Staging  Endometrial cancer [facility]) Staging form: Corpus Uteri - Carcinoma and Carcinosarcoma, AJCC 8th Edition - Clinical stage from 05/30/2022: FIGO Stage IIIB (cT3b, cN0, cM0) - Signed by Jacobo Evalene PARAS, MD on 05/30/2022 Stage prefix: Initial diagnosis   Evalene PARAS Jacobo, MD   12/26/2023 7:07 AM

## 2023-12-26 ENCOUNTER — Ambulatory Visit

## 2023-12-26 ENCOUNTER — Encounter: Payer: Self-pay | Admitting: Oncology

## 2023-12-31 ENCOUNTER — Other Ambulatory Visit: Payer: Self-pay | Admitting: *Deleted

## 2023-12-31 ENCOUNTER — Ambulatory Visit (INDEPENDENT_AMBULATORY_CARE_PROVIDER_SITE_OTHER): Admitting: Vascular Surgery

## 2023-12-31 ENCOUNTER — Encounter (INDEPENDENT_AMBULATORY_CARE_PROVIDER_SITE_OTHER)

## 2023-12-31 ENCOUNTER — Ambulatory Visit

## 2023-12-31 DIAGNOSIS — D649 Anemia, unspecified: Secondary | ICD-10-CM

## 2023-12-31 DIAGNOSIS — C541 Malignant neoplasm of endometrium: Secondary | ICD-10-CM

## 2024-01-01 ENCOUNTER — Encounter: Payer: Self-pay | Admitting: Oncology

## 2024-01-01 ENCOUNTER — Inpatient Hospital Stay: Attending: Obstetrics and Gynecology

## 2024-01-01 ENCOUNTER — Inpatient Hospital Stay (HOSPITAL_BASED_OUTPATIENT_CLINIC_OR_DEPARTMENT_OTHER): Admitting: Oncology

## 2024-01-01 ENCOUNTER — Inpatient Hospital Stay

## 2024-01-01 VITALS — BP 106/48 | HR 60 | Temp 97.8°F | Resp 18 | Ht 62.0 in | Wt 148.0 lb

## 2024-01-01 DIAGNOSIS — C541 Malignant neoplasm of endometrium: Secondary | ICD-10-CM

## 2024-01-01 DIAGNOSIS — E1165 Type 2 diabetes mellitus with hyperglycemia: Secondary | ICD-10-CM | POA: Diagnosis not present

## 2024-01-01 DIAGNOSIS — Z5111 Encounter for antineoplastic chemotherapy: Secondary | ICD-10-CM | POA: Diagnosis present

## 2024-01-01 DIAGNOSIS — N289 Disorder of kidney and ureter, unspecified: Secondary | ICD-10-CM | POA: Diagnosis not present

## 2024-01-01 DIAGNOSIS — D72819 Decreased white blood cell count, unspecified: Secondary | ICD-10-CM | POA: Insufficient documentation

## 2024-01-01 DIAGNOSIS — D649 Anemia, unspecified: Secondary | ICD-10-CM | POA: Diagnosis not present

## 2024-01-01 LAB — SAMPLE TO BLOOD BANK

## 2024-01-01 LAB — CBC WITH DIFFERENTIAL (CANCER CENTER ONLY)
Abs Immature Granulocytes: 0.02 K/uL (ref 0.00–0.07)
Basophils Absolute: 0 K/uL (ref 0.0–0.1)
Basophils Relative: 1 %
Eosinophils Absolute: 0.1 K/uL (ref 0.0–0.5)
Eosinophils Relative: 3 %
HCT: 28.3 % — ABNORMAL LOW (ref 36.0–46.0)
Hemoglobin: 8.9 g/dL — ABNORMAL LOW (ref 12.0–15.0)
Immature Granulocytes: 1 %
Lymphocytes Relative: 15 %
Lymphs Abs: 0.3 K/uL — ABNORMAL LOW (ref 0.7–4.0)
MCH: 27.1 pg (ref 26.0–34.0)
MCHC: 31.4 g/dL (ref 30.0–36.0)
MCV: 86 fL (ref 80.0–100.0)
Monocytes Absolute: 0.2 K/uL (ref 0.1–1.0)
Monocytes Relative: 8 %
Neutro Abs: 1.5 K/uL — ABNORMAL LOW (ref 1.7–7.7)
Neutrophils Relative %: 72 %
Platelet Count: 171 K/uL (ref 150–400)
RBC: 3.29 MIL/uL — ABNORMAL LOW (ref 3.87–5.11)
RDW: 17.7 % — ABNORMAL HIGH (ref 11.5–15.5)
WBC Count: 2 K/uL — ABNORMAL LOW (ref 4.0–10.5)
nRBC: 0 % (ref 0.0–0.2)

## 2024-01-01 LAB — CMP (CANCER CENTER ONLY)
ALT: 15 U/L (ref 0–44)
AST: 19 U/L (ref 15–41)
Albumin: 3.3 g/dL — ABNORMAL LOW (ref 3.5–5.0)
Alkaline Phosphatase: 68 U/L (ref 38–126)
Anion gap: 7 (ref 5–15)
BUN: 31 mg/dL — ABNORMAL HIGH (ref 8–23)
CO2: 30 mmol/L (ref 22–32)
Calcium: 8.6 mg/dL — ABNORMAL LOW (ref 8.9–10.3)
Chloride: 102 mmol/L (ref 98–111)
Creatinine: 1.11 mg/dL — ABNORMAL HIGH (ref 0.44–1.00)
GFR, Estimated: 52 mL/min — ABNORMAL LOW (ref >60)
Glucose, Bld: 179 mg/dL — ABNORMAL HIGH (ref 70–99)
Potassium: 3.4 mmol/L — ABNORMAL LOW (ref 3.5–5.1)
Sodium: 139 mmol/L (ref 135–145)
Total Bilirubin: 0.9 mg/dL (ref 0.0–1.2)
Total Protein: 5.5 g/dL — ABNORMAL LOW (ref 6.5–8.1)

## 2024-01-01 MED ORDER — SODIUM CHLORIDE 0.9% FLUSH
10.0000 mL | INTRAVENOUS | Status: DC | PRN
  Administered 2024-01-01: 10 mL
  Filled 2024-01-01: qty 10

## 2024-01-01 MED ORDER — DIPHENHYDRAMINE HCL 50 MG/ML IJ SOLN
25.0000 mg | Freq: Once | INTRAMUSCULAR | Status: AC
  Administered 2024-01-01: 25 mg via INTRAVENOUS
  Filled 2024-01-01: qty 1

## 2024-01-01 MED ORDER — FAMOTIDINE IN NACL 20-0.9 MG/50ML-% IV SOLN
20.0000 mg | Freq: Once | INTRAVENOUS | Status: AC
  Administered 2024-01-01: 20 mg via INTRAVENOUS
  Filled 2024-01-01: qty 50

## 2024-01-01 MED ORDER — SODIUM CHLORIDE 0.9 % IV SOLN
INTRAVENOUS | Status: DC
  Filled 2024-01-01: qty 250

## 2024-01-01 MED ORDER — SODIUM CHLORIDE 0.9 % IV SOLN
80.0000 mg/m2 | Freq: Once | INTRAVENOUS | Status: AC
  Administered 2024-01-01: 150 mg via INTRAVENOUS
  Filled 2024-01-01: qty 25

## 2024-01-01 MED ORDER — DEXAMETHASONE SODIUM PHOSPHATE 10 MG/ML IJ SOLN
10.0000 mg | Freq: Once | INTRAMUSCULAR | Status: AC
  Administered 2024-01-01: 10 mg via INTRAVENOUS
  Filled 2024-01-01: qty 1

## 2024-01-01 NOTE — Progress Notes (Signed)
 [facility]  Telephone:(336) [phone] Fax:(336) [phone]  ID: Terri [name] OB: 31-Oct-1950  MR#: [id number]  RDW#:[id number]  Patient Care Team: Patient, No Pcp Per as PCP - General (General Practice) Terri Rayfield BIRCH, RN as Oncology Nurse Navigator Terri Evalene PARAS, MD as Consulting Physician (Oncology) Terri Verdel Adler, MD as Referring Physician (Ophthalmology) [facility] Terri Hose, MD as Consulting Physician (Pulmonary Disease) Terri [name], [facility] as Pharmacist (Pharmacist)  CHIEF COMPLAINT: 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  INTERVAL HISTORY: Patient returns to clinic today for further evaluation and consideration of cycle 6, day 15 of single agent Taxol .  She continues to have chronic weakness and fatigue, but otherwise feels well.  She is tolerating her treatments without significant side effects.  She has a chronic peripheral neuropathy, but no other neurologic complaints.  Her vaginal bleeding has improved, but still evident.  She denies any recent fevers or illnesses.  She has no chest pain, shortness of breath, cough, or hemoptysis.  She denies any nausea, vomiting, constipation, or diarrhea.  She has no urinary complaints.  Patient offers no further specific complaints today.  REVIEW OF SYSTEMS:   Review of Systems  Constitutional:  Positive for malaise/fatigue. Negative for fever and weight loss.  Respiratory: Negative.  Negative for cough, hemoptysis and shortness of breath.   Cardiovascular: Negative.  Negative for chest pain and leg swelling.  Gastrointestinal: Negative.  Negative for abdominal pain, blood in stool and melena.  Genitourinary: Negative.  Negative for dysuria.  Musculoskeletal: Negative.  Negative for back pain and falls.  Skin: Negative.  Negative for rash.  Neurological:  Positive for sensory change and weakness. Negative for dizziness, focal weakness and headaches.   Psychiatric/Behavioral: Negative.  The patient is not nervous/anxious.     As per HPI. Otherwise, a complete review of systems is negative.  PAST MEDICAL HISTORY: Past Medical History:  Diagnosis Date   Adenomatous colon polyp    Aortic stenosis    Arthritis    Asthma    Cancer (HCC)    Claudication (HCC)    COPD (chronic obstructive pulmonary disease) (HCC)    Depression    Diabetes mellitus without complication (HCC)    Esophageal dysphagia    GERD (gastroesophageal reflux disease)    Hyperlipemia    Hypertension    Obesity    Severe obesity (BMI 35.0-35.9 with comorbidity) (HCC)     PAST SURGICAL HISTORY: Past Surgical History:  Procedure Laterality Date   BLADDER SURGERY     COLONOSCOPY WITH PROPOFOL  N/A 08/05/2015   Procedure: COLONOSCOPY WITH PROPOFOL ;  Surgeon: Terri Vaughn Manes, MD;  Location: [facility] ENDOSCOPY;  Service: Endoscopy;  Laterality: N/A;   ESOPHAGOGASTRODUODENOSCOPY N/A 01/09/2022   Procedure: ESOPHAGOGASTRODUODENOSCOPY (EGD);  Surgeon: Terri Ole DASEN, MD;  Location: [facility] ENDOSCOPY;  Service: Endoscopy;  Laterality: N/A;   FOOT SURGERY Left    FRACTURE SURGERY     IR CV LINE INJECTION  06/22/2022   IR IMAGING GUIDED PORT INSERTION  06/05/2022   IR PORT REPAIR CENTRAL VENOUS ACCESS DEVICE  07/02/2022   LOWER EXTREMITY ANGIOGRAPHY Left 02/04/2023   Procedure: Lower Extremity Angiography;  Surgeon: Terri Selinda RAMAN, MD;  Location: [facility] INVASIVE CV LAB;  Service: Cardiovascular;  Laterality: Left;   ORIF ANKLE FRACTURE Left 03/26/2017   Procedure: OPEN REDUCTION INTERNAL FIXATION (ORIF) ANKLE FRACTURE;  Surgeon: Terri Norleen PARAS, MD;  Location: [facility] ORS;  Service: Orthopedics;  Laterality: Left;    FAMILY HISTORY: Family History  Problem Relation Age of Onset   CVA Mother    Diabetes Mother    CAD Father    Breast cancer Neg Hx     ADVANCED DIRECTIVES (Y/N):  N  HEALTH MAINTENANCE: Social History   Tobacco Use   Smoking status: Former    Current  packs/day: 1.00    Average packs/day: 1 pack/day for 40.0 years (40.0 ttl pk-yrs)    Types: Cigarettes   Smokeless tobacco: Never   Tobacco comments:    Quite 2015  Vaping Use   Vaping status: Never Used  Substance Use Topics   Alcohol use: No   Drug use: No     Colonoscopy:  PAP:  Bone density:  Lipid panel:  No Known Allergies  Current Outpatient Medications  Medication Sig Dispense Refill   albuterol  (VENTOLIN  HFA) 108 (90 Base) MCG/ACT inhaler Inhale 2 puffs into the lungs every 6 (six) hours as needed for wheezing or shortness of breath. 1 each 1   amLODipine  (NORVASC ) 10 MG tablet Take 1 tablet (10 mg total) by mouth daily. 90 tablet 3   aspirin  EC 81 MG tablet Take 1 tablet (81 mg total) by mouth daily. Swallow whole. 150 tablet 2   atorvastatin  (LIPITOR) 80 MG tablet Take 1 tablet (80 mg total) by mouth daily. 90 tablet 3   budesonide -glycopyrrolate-formoterol  (BREZTRI) 160-9-4.8 MCG/ACT AERO inhaler Inhale 2 puffs into the lungs 2 (two) times daily. Via PAP AZ&Me     buPROPion  (WELLBUTRIN  XL) 300 MG 24 hr tablet Take 1 tablet (300 mg total) by mouth daily. 90 tablet 0   Cholecalciferol  (VITAMIN D3) 50 MCG (2000 UT) capsule Take 2,000 Units by mouth daily.     citalopram  (CELEXA ) 20 MG tablet Take 1 tablet (20 mg total) by mouth daily. 90 tablet 3   cloNIDine  (CATAPRES ) 0.2 MG tablet Take 1 tablet (0.2 mg total) by mouth 2 (two) times daily. 180 tablet 3   clopidogrel  (PLAVIX ) 75 MG tablet Take 1 tablet (75 mg total) by mouth daily. 90 tablet 3   cyanocobalamin  (VITAMIN B12) 1000 MCG tablet Take 1 tablet (1,000 mcg total) by mouth daily. 90 tablet 3   gabapentin (NEURONTIN) 100 MG capsule Take 100 mg by mouth 2 (two) times daily.     insulin  aspart (NOVOLOG ) 100 UNIT/ML injection Inject 5 Units into the skin 3 (three) times daily before meals.     insulin  degludec (TRESIBA FLEXTOUCH) 100 UNIT/ML FlexTouch Pen Inject 20 Units into the skin daily. 20 units once daily -  REPLACES Levemir      Iron , Ferrous Sulfate , 325 (65 Fe) MG TABS Take 1 tablet by mouth daily. 90 tablet 3   losartan -hydrochlorothiazide  (HYZAAR) 100-25 MG tablet Take 1 tablet by mouth daily. 90 tablet 3   megestrol  (MEGACE ) 40 MG tablet Take 1 tablet (40 mg total) by mouth daily. 30 tablet 2   metoprolol  succinate (TOPROL -XL) 50 MG 24 hr tablet Take 1 tablet (50 mg total) by mouth daily. 90 tablet 0   montelukast  (SINGULAIR ) 10 MG tablet Take 10 mg by mouth at bedtime.     ondansetron  (ZOFRAN ) 8 MG tablet Take 1 tablet (8 mg total) by mouth every 8 (eight) hours as needed for nausea or vomiting. 30 tablet 0   ONETOUCH VERIO test strip 1 each by Other route 3 (three) times daily. 100 each 10   No current facility-administered medications for this visit.   Facility-Administered Medications Ordered in Other Visits  Medication Dose Route Frequency Provider Last Rate  Last Admin   0.9 %  sodium chloride  infusion   Intravenous Continuous Tamar Miano J, MD 10 mL/hr at 01/01/24 1001 New Bag at 01/01/24 1001   sodium chloride  flush (NS) 0.9 % injection 10 mL  10 mL Intracatheter PRN Terri Evalene PARAS, MD        OBJECTIVE: Vitals:   01/01/24 0914  BP: (!) 106/48  Pulse: 60  Resp: 18  Temp: 97.8 F (36.6 C)  SpO2: 99%       Body mass index is 27.07 kg/m.    ECOG FS:1 - Symptomatic but completely ambulatory  General: Well-developed, well-nourished, no acute distress.  Sitting in a wheelchair. Eyes: Pink conjunctiva, anicteric sclera. HEENT: Normocephalic, moist mucous membranes. Lungs: No audible wheezing or coughing. Heart: Regular rate and rhythm. Abdomen: Soft, nontender, no obvious distention. Musculoskeletal: No edema, cyanosis, or clubbing. Neuro: Alert, answering all questions appropriately. Cranial nerves grossly intact. Skin: No rashes or petechiae noted. Psych: Normal affect.  LAB RESULTS:  Lab Results  Component Value Date   NA 139 01/01/2024   K 3.4 (L)  01/01/2024   CL 102 01/01/2024   CO2 30 01/01/2024   GLUCOSE 179 (H) 01/01/2024   BUN 31 (H) 01/01/2024   CREATININE 1.11 (H) 01/01/2024   CALCIUM  8.6 (L) 01/01/2024   PROT 5.5 (L) 01/01/2024   ALBUMIN 3.3 (L) 01/01/2024   AST 19 01/01/2024   ALT 15 01/01/2024   ALKPHOS 68 01/01/2024   BILITOT 0.9 01/01/2024   GFRNONAA 52 (L) 01/01/2024   GFRAA >60 03/27/2017    Lab Results  Component Value Date   WBC 2.0 (L) 01/01/2024   NEUTROABS 1.5 (L) 01/01/2024   HGB 8.9 (L) 01/01/2024   HCT 28.3 (L) 01/01/2024   MCV 86.0 01/01/2024   PLT 171 01/01/2024     STUDIES: No results found.  ONCOLOGY HISTORY: Patient was initially hesitant to undergo chemotherapy and elected to do XRT only which was completed on April 18, 2022.  CT scan results from October 08, 2022 reviewed independently with no obvious evidence of progressive disease. She completed 6 cycles of carboplatinum, Taxol , and Keytruda  on October 03, 2022.  Patient then initiated maintenance Keytruda  on October 23, 2022.  PET scan results from January 15, 2023 reviewed independently with mild nonspecific residual hypermetabolism in the lower uterine segment as well as small bilateral common iliac nodes possibly suggesting nodal metastasis.  Patient was seen by gynecology oncology who determined surgical intervention is not an option.  They recommend discontinuing Keytruda  for progressive disease and initiating single agent Doxil  every 28 days.  Patient received 3 cycles of Doxil  before progression of disease her last dose was given on June 05, 2023.  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  Repeat PET scan on October 15, 2023 reviewed independently with significant improvement of disease burden.  Internal exam by gynecology oncology also confirmed disease improvement.  Proceed with cycle 6,  day 15 of single agent Taxol .  Patient will return to clinic in 1 month with repeat PET scan and further evaluation by both medical oncology and gynecology oncology.  If she has residual disease at that time, can consider internal brachytherapy at [facility].   Port: Port revision successful.  Proceed with treatment as above. Hyperglycemia: Chronic and unchanged.  Patient continues to have poor blood glucose control. Renal insufficiency: Mild.  Patient's creatinine is 1.1  today.   Leukopenia: Total white blood cell count is decreased to 2.0.  Proceed cautiously with treatment as above. Anemia: Chronic and unchanged.  Patient's hemoglobin is 8.9 today. Vaginal bleeding: Improved.  Continue follow-up with gynecology oncology as above.  Claudication symptoms: Resolved.  Patient underwent revascularization procedure on February 04, 2023.  Patient reports she recently missed her appointment with vascular surgery and needs to reschedule. Right hamstring tendon rupture: Patient reports there is no plan for surgical repair. Rash: Resolved.  Likely secondary to Doxil  which has been discontinued.   Poor appetite/weight loss: Continue Megace  as prescribed.  Patient refuses referral to dietary.   Peripheral neuropathy: Chronic and unchanged.  Continue follow-up with neurology as recommended.   Falls: Intermittent.  Patient now has 2 walkers at her home upstairs and downstairs.  She admits to only using them occasionally.  Patient expressed understanding and was in agreement with this plan. She also understands that She can call clinic at any time with any questions, concerns, or complaints.    Cancer Staging  Endometrial cancer [facility]) Staging form: Corpus Uteri - Carcinoma and Carcinosarcoma, AJCC 8th Edition - Clinical stage from 05/30/2022: FIGO Stage IIIB (cT3b, cN0, cM0) - Signed by Terri Evalene PARAS, MD on 05/30/2022 Stage prefix: Initial diagnosis   Evalene [name] Jacobo, MD   01/01/2024 12:16  PM

## 2024-01-01 NOTE — Progress Notes (Signed)
 Patient is doing ok, no new questions for the doctor today.  She seems to think that she was supposed to get another scan to see how her cancer is doing, if it has got better.

## 2024-01-01 NOTE — Patient Instructions (Signed)
 [facility] - A DEPT OF [facility]  Discharge Instructions: Thank you for choosing [facility] to provide your oncology and hematology care.  If you have a lab appointment with the Cancer Center, please go directly to the Cancer Center and check in at the registration area.  Wear comfortable clothing and clothing appropriate for easy access to any Portacath or PICC line.   We strive to give you quality time with your provider. You may need to reschedule your appointment if you arrive late (15 or more minutes).  Arriving late affects you and other patients whose appointments are after yours.  Also, if you miss three or more appointments without notifying the office, you may be dismissed from the clinic at the provider's discretion.      For prescription refill requests, have your pharmacy contact our office and allow 72 hours for refills to be completed.    Today you received the following chemotherapy and/or immunotherapy agents : PACLitaxel       To help prevent nausea and vomiting after your treatment, we encourage you to take your nausea medication as directed.  BELOW ARE SYMPTOMS THAT SHOULD BE REPORTED IMMEDIATELY: *FEVER GREATER THAN 100.4 F (38 C) OR HIGHER *CHILLS OR SWEATING *NAUSEA AND VOMITING THAT IS NOT CONTROLLED WITH YOUR NAUSEA MEDICATION *UNUSUAL SHORTNESS OF BREATH *UNUSUAL BRUISING OR BLEEDING *URINARY PROBLEMS (pain or burning when urinating, or frequent urination) *BOWEL PROBLEMS (unusual diarrhea, constipation, pain near the anus) TENDERNESS IN MOUTH AND THROAT WITH OR WITHOUT PRESENCE OF ULCERS (sore throat, sores in mouth, or a toothache) UNUSUAL RASH, SWELLING OR PAIN  UNUSUAL VAGINAL DISCHARGE OR ITCHING   Items with * indicate a potential emergency and should be followed up as soon as possible or go to the Emergency Department if any problems should occur.  Please show the CHEMOTHERAPY ALERT CARD or IMMUNOTHERAPY  ALERT CARD at check-in to the Emergency Department and triage nurse.  Should you have questions after your visit or need to cancel or reschedule your appointment, please contact [facility] - A DEPT OF [facility]  [phone] and follow the prompts.  Office hours are 8:00 a.m. to 4:30 p.m. Monday - Friday. Please note that voicemails left after 4:00 p.m. may not be returned until the following business day.  We are closed weekends and major holidays. You have access to a nurse at all times for urgent questions. Please call the main number to the clinic [phone] and follow the prompts.  For any non-urgent questions, you may also contact your provider using MyChart. We now offer e-Visits for anyone 45 and older to request care online for non-urgent symptoms. For details visit [email or web address].   Also download the MyChart app! Go to the app store, search "MyChart", open the app, select [place], and log in with your MyChart username and password.

## 2024-01-02 ENCOUNTER — Ambulatory Visit

## 2024-01-03 ENCOUNTER — Encounter: Payer: Self-pay | Admitting: Oncology

## 2024-01-03 ENCOUNTER — Ambulatory Visit: Admitting: Student in an Organized Health Care Education/Training Program

## 2024-01-03 ENCOUNTER — Other Ambulatory Visit: Payer: Self-pay | Admitting: Nurse Practitioner

## 2024-01-03 ENCOUNTER — Other Ambulatory Visit: Payer: Self-pay

## 2024-01-03 VITALS — BP 138/70 | HR 69 | Temp 97.1°F | Ht 62.0 in | Wt 140.4 lb

## 2024-01-03 DIAGNOSIS — F39 Unspecified mood [affective] disorder: Secondary | ICD-10-CM

## 2024-01-03 DIAGNOSIS — J4489 Other specified chronic obstructive pulmonary disease: Secondary | ICD-10-CM

## 2024-01-03 DIAGNOSIS — J4540 Moderate persistent asthma, uncomplicated: Secondary | ICD-10-CM

## 2024-01-03 DIAGNOSIS — J432 Centrilobular emphysema: Secondary | ICD-10-CM

## 2024-01-03 DIAGNOSIS — E1159 Type 2 diabetes mellitus with other circulatory complications: Secondary | ICD-10-CM

## 2024-01-03 MED ORDER — CITALOPRAM HYDROBROMIDE 20 MG PO TABS: 20.0000 mg | ORAL_TABLET | Freq: Every day | ORAL | 3 refills | Status: AC

## 2024-01-03 MED ORDER — LOSARTAN POTASSIUM-HCTZ 100-25 MG PO TABS: 1.0000 | ORAL_TABLET | Freq: Every day | ORAL | 3 refills | Status: AC

## 2024-01-03 MED ORDER — AMLODIPINE BESYLATE 10 MG PO TABS
10.0000 mg | ORAL_TABLET | Freq: Every day | ORAL | 3 refills | Status: AC
Start: 2024-01-03 — End: ?

## 2024-01-03 NOTE — Telephone Encounter (Signed)
 Copied from CRM #[id number]. Topic: Clinical - Medication Refill >> Jan 03, 2024  2:13 PM [place] E wrote: Medication: citalopram  (CELEXA ) 20 MG tablet losartan -hydrochlorothiazide  (HYZAAR) 100-25 MG tablet amLODipine  (NORVASC ) 10 MG tablet  Has the patient contacted their pharmacy? No (Agent: If no, request that the patient contact the pharmacy for the refill. If patient does not wish to contact the pharmacy document the reason why and proceed with request.) (Agent: If yes, when and what did the pharmacy advise?)  This is the patient's preferred pharmacy:   [facility] - [place], [place] - [address] [place] [address] Phone: [phone] Fax: [phone]  Is this the correct pharmacy for this prescription? Yes If no, delete pharmacy and type the correct one.   Has the prescription been filled recently? No  Is the patient out of the medication? No  Has the patient been seen for an appointment in the last year OR does the patient have an upcoming appointment? Yes  Can we respond through MyChart? Yes  Agent: Please be advised that Rx refills may take up to 3 business days. We ask that you follow-up with your pharmacy.

## 2024-01-03 NOTE — Progress Notes (Signed)
 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 Previous PFT's showed significant reversibility in FEV1 post bronchodilator challenge, consistent with a component of asthma. She does have a significant history of smoking, and while FEV/FVC ratio is normal, and overlap between asthma and COPD (preserved ratio impaired spirometry) is likely.  Following our last visit, we'd initiated triple therapy with Advair + Spiriva . This was switched to Breztri and she's tolerated it well, which we will continue.   Furthermore, she was maintained on pembrolizumab  and checkpoint inhibitor toxicity remains on the differential. Previous chest CT's don't show signs of pneumonitis, but a recent PET/CT suggested tree-in-bud nodularity. We did obtain a dedicated chest CT which showed resolution and improvement in these findings. Most recent PET/CT in June also doesn't show this. Given this, there is no need for repeat chest imaging from a pulmonary perspective. She will continue to follow up with oncology for her malignancy with regularly scheduled staging PET/CT's as indicated.  - Continue Breztri two puffs twice daily  Return in about 1 year (around 01/02/2025).  I spent 31 minutes caring for this patient today, including preparing to see the patient, obtaining a medical history , reviewing a separately obtained history, performing a medically appropriate examination and/or evaluation, counseling and educating the patient/family/caregiver, ordering medications, tests, or procedures, documenting clinical information in the electronic health record, and independently interpreting results (not separately reported/billed) and communicating results to the patient/family/caregiver  Belva November, MD [facility]   End of visit medications:  No orders of the defined types were placed in this encounter.    Current Outpatient Medications:    budesonide -glycopyrrolate-formoterol  (BREZTRI) 160-9-4.8 MCG/ACT AERO inhaler, Inhale 2 puffs into the lungs 2 (two) times daily. Via PAP AZ&Me (Patient taking differently: Inhale 1 puff into the lungs 2 (two) times daily. Via PAP AZ&Me), Disp: , Rfl:    albuterol  (VENTOLIN  HFA) 108 (90 Base) MCG/ACT inhaler, Inhale 2 puffs into the lungs every 6 (six) hours as needed for wheezing or shortness of breath., Disp: 1 each, Rfl: 1   amLODipine  (NORVASC ) 10 MG tablet, Take 1 tablet (10 mg total) by mouth daily., Disp: 90 tablet, Rfl: 3   aspirin  EC 81 MG tablet, Take 1 tablet (81 mg total) by mouth daily. Swallow whole., Disp: 150 tablet, Rfl: 2   atorvastatin  (LIPITOR) 80 MG tablet, Take 1 tablet (80 mg total) by mouth daily., Disp: 90 tablet, Rfl: 3   buPROPion  (WELLBUTRIN  XL) 300 MG 24 hr tablet, Take 1 tablet (300 mg total) by mouth daily., Disp: 90 tablet, Rfl: 0   Cholecalciferol  (VITAMIN D3) 50 MCG (2000 UT) capsule, Take 2,000 Units by mouth daily., Disp: , Rfl:    citalopram  (CELEXA ) 20 MG tablet, Take 1 tablet (20 mg total) by mouth daily., Disp: 90 tablet, Rfl: 3   cloNIDine  (CATAPRES ) 0.2 MG tablet, Take 1 tablet (0.2 mg total) by mouth 2 (two) times daily., Disp: 180 tablet, Rfl: 3   clopidogrel  (PLAVIX ) 75 MG tablet, Take 1 tablet (75 mg total) by mouth daily., Disp: 90 tablet, Rfl: 3   cyanocobalamin  (VITAMIN B12) 1000 MCG tablet, Take 1 tablet (1,000 mcg total) by mouth daily., Disp: 90 tablet, Rfl: 3   gabapentin (NEURONTIN) 100 MG capsule, Take 100 mg by mouth 2 (two) times daily., Disp: , Rfl:  insulin  aspart (NOVOLOG ) 100 UNIT/ML injection, Inject 5 Units into the skin 3 (three) times daily before meals., Disp: , Rfl:    insulin  degludec (TRESIBA FLEXTOUCH) 100 UNIT/ML FlexTouch Pen, Inject 20 Units into the skin daily. 20 units once  daily - REPLACES Levemir , Disp: , Rfl:    Iron , Ferrous Sulfate , 325 (65 Fe) MG TABS, Take 1 tablet by mouth daily., Disp: 90 tablet, Rfl: 3   losartan -hydrochlorothiazide  (HYZAAR) 100-25 MG tablet, Take 1 tablet by mouth daily., Disp: 90 tablet, Rfl: 3   megestrol  (MEGACE ) 40 MG tablet, Take 1 tablet (40 mg total) by mouth daily., Disp: 30 tablet, Rfl: 2   metoprolol  succinate (TOPROL -XL) 50 MG 24 hr tablet, Take 1 tablet (50 mg total) by mouth daily., Disp: 90 tablet, Rfl: 0   montelukast  (SINGULAIR ) 10 MG tablet, Take 10 mg by mouth at bedtime., Disp: , Rfl:    ondansetron  (ZOFRAN ) 8 MG tablet, Take 1 tablet (8 mg total) by mouth every 8 (eight) hours as needed for nausea or vomiting., Disp: 30 tablet, Rfl: 0   ONETOUCH VERIO test strip, 1 each by Other route 3 (three) times daily., Disp: 100 each, Rfl: 10   Subjective:   PATIENT ID: Terri [name] GENDER: female DOB: Mar 08, 1951, MRN: [id number]  Chief Complaint  Patient presents with   Asthma    No SOB, wheezing or cough. Pharmacy switched her to Breztri. Taking it 1 puff BID.    HPI  Patient is a pleasant 73 year old female with a past medical history of endometrial cancer with recurrence presenting to clinic for follow up.  Return Visit 07/24/2023:   She was last seen in clinic in December of 2024. Respiratory symptoms have been much improved, with 8/10 improvement in severity. She feels that the Wixela has helped a lot. She continues to be somewhat short of breath with exertion.   She was seen in clinic in October of 2024 and December of 2024 for shortness of breath. She was admitted to the hospital late November of 2024 for an asthma/COPD exacerbation and treated with steroids with improvement. She was able to get PFT's performed prior to this visit and is here to discuss results.   Interval medical history is notable for recurrence of endometrial cancer for which she is being followed by Dr. Jacobo. She was restarted on  chemotherapy. She had a PET/CT February of 2025 that showed hypermetabolism in the endometrium consistent with recurrent/progressive disease with signs of LN metastasis as well as scattered tree-in-bud nodularity in the lungs. She had previously completed chemoradiation and was maintained on pembrolizumab  maintenance prior to this recurrence.    Initial history obtained from the patient showed symptom onset around July of 2024 with shortness of breath, cough, and wheeze. She was unable to go up a flight of stairs without having to catch her breath.  Return Visit 01/03/2024:  Presenting for follow up and reports minimal respiratory symptoms. She reports exertional dyspnea but otherwise cough is improved, and she does not have a wheeze. Her inhaler regimen was switched to Breztri which she has tolerated very well. She has had her repeat CT as well as a repeat PET/CT in the interim. She is here to discuss these results. She continues to follow closely with oncology.   Patient reports a history of asthma that was diagnosed in [place] .  At that time, she was a heavy smoker.  She was previously maintained on Advair.   Patient is originally from [place].  She  has a history of smoking, having smoked for at least 40 years, 3 packs at her height, but mostly 1 pack a day.  She has between 40 and 60 pack years of smoking history on her.  She quit in 2015.  Ancillary information including prior medications, full medical/surgical/family/social histories, and PFTs (when available) are listed below and have been reviewed.   Review of Systems  Constitutional:  Negative for chills, fever, malaise/fatigue and weight loss.  Respiratory:  Positive for shortness of breath. Negative for cough, hemoptysis, sputum production and wheezing.   Cardiovascular:  Negative for chest pain and PND.     Objective:   Vitals:   01/03/24 1057  BP: 138/70  Pulse: 69  Temp: (!) 97.1 F (36.2 C)  SpO2: 95%  Weight: 140 lb  6.4 oz (63.7 kg)  Height: 5' 2 (1.575 m)   95% on RA BMI Readings from Last 3 Encounters:  01/03/24 25.68 kg/m  01/01/24 27.07 kg/m  12/25/23 28.79 kg/m   Wt Readings from Last 3 Encounters:  01/03/24 140 lb 6.4 oz (63.7 kg)  01/01/24 148 lb (67.1 kg)  12/25/23 157 lb 6.4 oz (71.4 kg)    Physical Exam Constitutional:      Appearance: She is obese.  Cardiovascular:     Rate and Rhythm: Normal rate and regular rhythm.  Pulmonary:     Effort: Pulmonary effort is normal.     Breath sounds: Normal breath sounds. No wheezing.  Neurological:     General: No focal deficit present.     Mental Status: She is alert and oriented to person, place, and time. Mental status is at baseline.       Ancillary Information    Past Medical History:  Diagnosis Date   Adenomatous colon polyp    Aortic stenosis    Arthritis    Asthma    Cancer (HCC)    Claudication (HCC)    COPD (chronic obstructive pulmonary disease) (HCC)    Depression    Diabetes mellitus without complication (HCC)    Esophageal dysphagia    GERD (gastroesophageal reflux disease)    Hyperlipemia    Hypertension    Obesity    Severe obesity (BMI 35.0-35.9 with comorbidity) (HCC)      Family History  Problem Relation Age of Onset   CVA Mother    Diabetes Mother    CAD Father    Breast cancer Neg Hx      Past Surgical History:  Procedure Laterality Date   BLADDER SURGERY     COLONOSCOPY WITH PROPOFOL  N/A 08/05/2015   Procedure: COLONOSCOPY WITH PROPOFOL ;  Surgeon: Donnice Vaughn Manes, MD;  Location[facility] END[facility]COPY;  Service: Endoscopy;  Laterality: N/A;   ESOPHAGOGASTRODUODENOSCOPY N/A 01/09/2022   Procedure: ESOPHAGOGASTRODUODENOSCOPY (EGD);  Surgeon: Maryruth Ole DASEN, MD;  Location: [facility] ENDOSCOPY;  Service: Endoscopy;  Laterality: N/A;   FOOT SURGERY Left    FRACTURE SURGERY     IR CV LINE INJECTION  06/22/2022   IR IMAGING GUIDED PORT INSERTION  06/05/2022   IR PORT REPAIR CENTRAL VENOUS ACCESS  DEVICE  07/02/2022   LOWER EXTREMITY ANGIOGRAPHY Left 02/04/2023   Procedure: Lower Extremity Angiography;  Surgeon: Marea Selinda RAMAN, MD;  Location: [facility] INVASIVE CV LAB;  Service: Cardiovascular;  Laterality: Left;   ORIF ANKLE FRACTURE Left 03/26/2017   Procedure: OPEN REDUCTION INTERNAL FIXATION (ORIF) ANKLE FRACTURE;  Surgeon: Edie Norleen PARAS, MD;  Location: [facility] ORS;  Service: Orthopedics;  Laterality: Left;    Social History  Socioeconomic History   Marital status: Single    Spouse name: Not on file   Number of children: Not on file   Years of education: Not on file   Highest education level: Not on file  Occupational History   Not on file  Tobacco Use   Smoking status: Former    Current packs/day: 1.00    Average packs/day: 1 pack/day for 40.0 years (40.0 ttl pk-yrs)    Types: Cigarettes   Smokeless tobacco: Never   Tobacco comments:    Quite 2015  Vaping Use   Vaping status: Never Used  Substance and Sexual Activity   Alcohol use: No   Drug use: No   Sexual activity: Not on file  Other Topics Concern   Not on file  Social History Narrative   Lives alone.  Alyse Clarity, here with her today.  Indoor cats.     Social Drivers of Corporate investment banker Strain: Low Risk  (12/20/2022)   Received from [facility]   Overall Financial Resource Strain (CARDIA)    Difficulty of Paying Living Expenses: Not very hard  Food Insecurity: No Food Insecurity (12/20/2022)   Received from [facility]   Hunger Vital Sign    Within the past 12 months, you worried that your food would run out before you got the money to buy more.: Never true    Within the past 12 months, the food you bought just didn't last and you didn't have money to get more.: Never true  Transportation Needs: Unknown (12/20/2022)   Received from [facility] - Transportation    In the past 12 months, has lack of transportation kept you from medical  appointments or from getting medications?: No    Lack of Transportation (Non-Medical): Not on file  Physical Activity: Inactive (09/20/2022)   Exercise Vital Sign    Days of Exercise per Week: 0 days    Minutes of Exercise per Session: 0 min  Stress: Stress Concern Present (09/20/2022)   [employer] of Occupational Health - Occupational Stress Questionnaire    Feeling of Stress : To some extent  Social Connections: Socially Isolated (09/20/2022)   Social Connection and Isolation Panel    Frequency of Communication with Friends and Family: Once a week    Frequency of Social Gatherings with Friends and Family: Never    Attends Religious Services: Never    Database administrator or Organizations: No    Attends Banker Meetings: Never    Marital Status: Never married  Intimate Partner Violence: Not At Risk (05/30/2022)   Humiliation, Afraid, Rape, and Kick questionnaire    Fear of Current or Ex-Partner: No    Emotionally Abused: No    Physically Abused: No    Sexually Abused: No     No Known Allergies   CBC    Component Value Date/Time   WBC 2.0 (L) 01/01/2024 0857   WBC 1.8 (L) 08/28/2023 1151   RBC 3.29 (L) 01/01/2024 0857   HGB 8.9 (L) 01/01/2024 0857   HCT 28.3 (L) 01/01/2024 0857   PLT 171 01/01/2024 0857   MCV 86.0 01/01/2024 0857   MCH 27.1 01/01/2024 0857   MCHC 31.4 01/01/2024 0857   RDW 17.7 (H) 01/01/2024 0857   LYMPHSABS 0.3 (L) 01/01/2024 0857   MONOABS 0.2 01/01/2024 0857   EOSABS 0.1 01/01/2024 0857   BASOSABS 0.0 01/01/2024 0857    Pulmonary Functions Testing  Results:    Latest Ref Rng & Units 07/04/2023    1:42 PM  PFT Results  FVC-Pre L 1.48   FVC-Predicted Pre % 55   FVC-Post L 2.14   FVC-Predicted Post % 80   Pre FEV1/FVC % % 74   Post FEV1/FCV % % 67   FEV1-Pre L 1.10   FEV1-Predicted Pre % 55   FEV1-Post L 1.43   TLC L 4.44   TLC % Predicted % 93   RV % Predicted % 139     Outpatient Medications Prior to Visit   Medication Sig Dispense Refill   budesonide -glycopyrrolate-formoterol  (BREZTRI) 160-9-4.8 MCG/ACT AERO inhaler Inhale 2 puffs into the lungs 2 (two) times daily. Via PAP AZ&Me (Patient taking differently: Inhale 1 puff into the lungs 2 (two) times daily. Via PAP AZ&Me)     albuterol  (VENTOLIN  HFA) 108 (90 Base) MCG/ACT inhaler Inhale 2 puffs into the lungs every 6 (six) hours as needed for wheezing or shortness of breath. 1 each 1   amLODipine  (NORVASC ) 10 MG tablet Take 1 tablet (10 mg total) by mouth daily. 90 tablet 3   aspirin  EC 81 MG tablet Take 1 tablet (81 mg total) by mouth daily. Swallow whole. 150 tablet 2   atorvastatin  (LIPITOR) 80 MG tablet Take 1 tablet (80 mg total) by mouth daily. 90 tablet 3   buPROPion  (WELLBUTRIN  XL) 300 MG 24 hr tablet Take 1 tablet (300 mg total) by mouth daily. 90 tablet 0   Cholecalciferol  (VITAMIN D3) 50 MCG (2000 UT) capsule Take 2,000 Units by mouth daily.     citalopram  (CELEXA ) 20 MG tablet Take 1 tablet (20 mg total) by mouth daily. 90 tablet 3   cloNIDine  (CATAPRES ) 0.2 MG tablet Take 1 tablet (0.2 mg total) by mouth 2 (two) times daily. 180 tablet 3   clopidogrel  (PLAVIX ) 75 MG tablet Take 1 tablet (75 mg total) by mouth daily. 90 tablet 3   cyanocobalamin  (VITAMIN B12) 1000 MCG tablet Take 1 tablet (1,000 mcg total) by mouth daily. 90 tablet 3   gabapentin (NEURONTIN) 100 MG capsule Take 100 mg by mouth 2 (two) times daily.     insulin  aspart (NOVOLOG ) 100 UNIT/ML injection Inject 5 Units into the skin 3 (three) times daily before meals.     insulin  degludec (TRESIBA FLEXTOUCH) 100 UNIT/ML FlexTouch Pen Inject 20 Units into the skin daily. 20 units once daily - REPLACES Levemir      Iron , Ferrous Sulfate , 325 (65 Fe) MG TABS Take 1 tablet by mouth daily. 90 tablet 3   losartan -hydrochlorothiazide  (HYZAAR) 100-25 MG tablet Take 1 tablet by mouth daily. 90 tablet 3   megestrol  (MEGACE ) 40 MG tablet Take 1 tablet (40 mg total) by mouth daily. 30 tablet  2   metoprolol  succinate (TOPROL -XL) 50 MG 24 hr tablet Take 1 tablet (50 mg total) by mouth daily. 90 tablet 0   montelukast  (SINGULAIR ) 10 MG tablet Take 10 mg by mouth at bedtime.     ondansetron  (ZOFRAN ) 8 MG tablet Take 1 tablet (8 mg total) by mouth every 8 (eight) hours as needed for nausea or vomiting. 30 tablet 0   ONETOUCH VERIO test strip 1 each by Other route 3 (three) times daily. 100 each 10   No facility-administered medications prior to visit.

## 2024-01-06 ENCOUNTER — Other Ambulatory Visit (INDEPENDENT_AMBULATORY_CARE_PROVIDER_SITE_OTHER): Admitting: Pharmacist

## 2024-01-06 ENCOUNTER — Encounter: Payer: Self-pay | Admitting: Oncology

## 2024-01-06 DIAGNOSIS — E118 Type 2 diabetes mellitus with unspecified complications: Secondary | ICD-10-CM

## 2024-01-06 NOTE — Progress Notes (Signed)
 01/06/2024 Name: Terri [name] MRN: [id number] DOB: 04-12-1951  Subjective  Chief Complaint  Patient presents with   Diabetes   Reason for visit: ?  Terri [name] is a 73 y.o. female with a history of diabetes (type 2), who presents today for a follow up visit for the management of diabetes.   Pertinent PMH also includes atherosclerosis w intermittent claudication, HTN, COPD, HLD, Endometrial cancer, obesity.  Known DM Complications:  nephropathy w microalbuminuria; PAD with claudication BLE; HTN    Care Team: Primary Care Provider: Leron Glance, NP  Pulmonology Dr. Isadora; Oncology Dr. Jacobo; Cardiology at [facility]  Last Diabetes-Related Visit: With PCP 11/04/23, with PharmD 12/02/23 Recent Summary of Change: 8/4: FBG generally close to goal. PPBG increased after stopping Ozempic . No further c/f lows. Increase lunch Novolog  2 units (5u B/D, 7u L) 6/20: Patient decision to stop Ozempic  d/t weight loss related to chemo  5/28: A1c 6.7%. BG all within goal though with occasional overnight lows, as low as 50s. ??Tresiba to 20 u/d. Stop glipizide .  ??Tresiba 22 units daily (12%??). 25 units on day of chemo and day following chemo.   Since Last visit / History of Present Illness: ?  Patient reports doing well overall. Remains off of Ozempic .  Feels blood sugars have been variable. Feels morning sugars are well-controlled (reports on average 120s-130s). In the evenings, however, notes sugars are typically elevated. Yesterday, increased all meal doses to 7 units though 1 reading 67 at night. Today, has reduced all doses to 6 units.   PET scan 10/3 s/p most recent chemo completion. States she has off until 10/3 (no more chemo sessions). Is interested in CGM visit. Notes she has been accepted for [facility] financial assistance which will help cover costs.   States she has switched insurance for something that covers her chemo treatments better. Coverage started 11/29/23. [facility]. Interested in  switching back to [facility] at the start of the new year, possibly soon if able.   Reported DM Regimen: ?  Novolog  (aspart) 5-7 units up to three times daily before meals Tresiba U100 20 units daily  DM medications tried in the past:?  Metformin  (diarrhea); switched to glipizide  Glipizide  (D/c 08/2023 due to increasing hypoglycemia) Ozempic  (tolerated well, self-discontinued ~10/18/23 due to cancer-related weight loss)  SMBG: Glucometer - Checks BG twice daily via glucometer (fasting, pre-dinner) FBG 120-130 mg/dL Evening BG were as high as 200. After increasing Novolog  to 7 units TID, notes one reading of 67 in the evening.   Hypo/Hyperglycemia: ?  Symptoms of hypoglycemia since last visit:? x1 reading 67 s/p self-increasing Novolog   Reported Diet:  Patient typically eats 3 meals per day. Feels appetite is normalizing to pre-chemo state. Breakfast (8:30): Varies Lunch (12-1pm): Pizza (under 50 carbs); Sandwich; Chicken salad+crackers Dinner (7-7:30 pm): Largest meal of the day Snacks: Sweet tooth (states that most sweets are sugar-free though in more recent weeks, with her low Bgs she has been having more ice cream and pound cakes. No more lows the past week, plans to discontinue this)  Exercise: No  DM Prevention:  Statin: Taking; high intensity.?  History of chronic kidney disease? yes History of albuminuria? yes, last UACR on 01/24/23 = 100.4 mg/g ACE/ARB - Taking; Urine MA/CR Ratio - elevated urinary albumin excretion.  Last foot exam: 02/25/2023 Tobacco Use: Former   Cardiovascular Risk Reduction History of clinical ASCVD? PAD w Claudication The 10-year ASCVD risk score (Arnett DK, et al., 2019) is: 46.9% History of heart failure? no History  of hyperlipidemia? yes Current BMI: 28.3 kg/m2 (Ht 62 in, Wt 70.3 kg) Taking statin? yes; high intensity (atorvastaitn 80 mg) Taking aspirin ? indicated (secondary prevention); Taking clopidogrel    Taking SGLT-2i? no Taking GLP- 1 RA? yes     _______________________________________________  Objective    Review of Systems:? Limited in the setting of virtual visit  GI:? No nausea, vomiting, constipation, diarrhea, abdominal pain, dyspepsia, change in bowel habits  Endocrine:? No polyuria, polyphagia or blurred vision    Physical Examination:  Vitals:  Wt Readings from Last 3 Encounters:  01/03/24 140 lb 6.4 oz (63.7 kg)  01/01/24 148 lb (67.1 kg)  12/25/23 157 lb 6.4 oz (71.4 kg)   BP Readings from Last 3 Encounters:  01/03/24 138/70  01/01/24 (!) 106/48  12/25/23 (!) 157/60   Pulse Readings from Last 3 Encounters:  01/03/24 69  01/01/24 60  12/25/23 66   Labs:?  Lab Results  Component Value Date   HGBA1C 6.1 (A) 09/25/2023   HGBA1C 5.7 (A) 06/26/2023   HGBA1C 7.2 (H) 12/25/2022   GLUCOSE 179 (H) 01/01/2024   CREATININE 1.11 (H) 01/01/2024   CREATININE 0.94 12/25/2023   CREATININE 1.34 (H) 12/17/2023   GFR 52.91 (L) 01/24/2023   Lab Results  Component Value Date   CHOL 156 01/24/2023   LDLCALC 87 01/24/2023   HDL 46.20 01/24/2023   TRIG 112.0 01/24/2023   ALT 15 01/01/2024   ALT 13 12/25/2023   AST 19 01/01/2024   AST 18 12/25/2023     Chemistry      Component Value Date/Time   NA 139 01/01/2024 0857   K 3.4 (L) 01/01/2024 0857   CL 102 01/01/2024 0857   CO2 30 01/01/2024 0857   BUN 31 (H) 01/01/2024 0857   CREATININE 1.11 (H) 01/01/2024 0857      Component Value Date/Time   CALCIUM  8.6 (L) 01/01/2024 0857   ALKPHOS 68 01/01/2024 0857   AST 19 01/01/2024 0857   ALT 15 01/01/2024 0857   BILITOT 0.9 01/01/2024 0857     The 10-year ASCVD risk score (Arnett DK, et al., 2019) is: 46.9% Assessment and Plan:   1. Diabetes, type 2: controlled A1c 6.1% (09/25/23), goal <7.5% without hypoglycemia given medical complexity and cancer treatment. Sugars have remained very well controlled fasting with continued evening hyperglycinemia (highest ~200 mg/dL). Self-increased insulin  to 7u TIDAC resulting in  1 low of 67 mg/dL. Agreeable with dose in the middle, 6 mg TIDAC. Previously approved for CGM though transportation for office visits during frequent chemo was limiting. Has break from appointments unto early October and requests another CGM training visit.  Current regimen: Tresiba 20 units daily, Novolog  6-7 units TIDAC Continue Tresiba 20 units daily Continue Novolog  at 6 unit [facility] Continue SMBG with glucometer CGM: Libre 3+ sensor requested through new insurance. Part B billing required - request started via [employer] / [employer] - fax request sent to clinic  Reviewed s/sx/tx hypoglycemia.  Follow up PharmD f/u via office for CGM training 2-4 weeks depending on patient transportation Patient provided with direct line for questions/concerns prior to next follow up visit.   [name], PharmD Clinical Pharmacist [facility] [phone]

## 2024-01-07 ENCOUNTER — Other Ambulatory Visit: Payer: Self-pay

## 2024-01-07 ENCOUNTER — Ambulatory Visit

## 2024-01-07 DIAGNOSIS — E1159 Type 2 diabetes mellitus with other circulatory complications: Secondary | ICD-10-CM

## 2024-01-07 MED ORDER — CLONIDINE HCL 0.2 MG PO TABS: 0.2000 mg | ORAL_TABLET | Freq: Two times a day (BID) | ORAL | 3 refills | Status: AC

## 2024-01-08 ENCOUNTER — Telehealth: Payer: Self-pay

## 2024-01-08 ENCOUNTER — Encounter: Payer: Self-pay | Admitting: Oncology

## 2024-01-08 NOTE — Telephone Encounter (Signed)
 Clinical Social Worker attempted to contact patient by phone to discuss assistance at home.  Was unable to leave a voicemail.

## 2024-01-09 ENCOUNTER — Telehealth: Payer: Self-pay

## 2024-01-09 ENCOUNTER — Ambulatory Visit

## 2024-01-09 NOTE — Telephone Encounter (Signed)
 [employer] form placed in provider to be signed folder '

## 2024-01-10 ENCOUNTER — Telehealth: Payer: Self-pay

## 2024-01-10 NOTE — Telephone Encounter (Signed)
 Clinical Social Work attempted to contact patient again by phone.  The message said to call again later and CSW could not leave a message. CSW will attempt to contact patient again.

## 2024-01-13 ENCOUNTER — Telehealth: Payer: Self-pay

## 2024-01-13 NOTE — Telephone Encounter (Signed)
 [employer] form placed in provider to be signed folder '

## 2024-01-14 ENCOUNTER — Telehealth: Payer: Self-pay | Admitting: Nurse Practitioner

## 2024-01-14 ENCOUNTER — Ambulatory Visit (INDEPENDENT_AMBULATORY_CARE_PROVIDER_SITE_OTHER): Admitting: Pharmacist

## 2024-01-14 DIAGNOSIS — E118 Type 2 diabetes mellitus with unspecified complications: Secondary | ICD-10-CM

## 2024-01-14 NOTE — Telephone Encounter (Signed)
 Pt picked up Novolog  needles today. Could not addend note from when Pt was notified they were here on 8/7. -kh

## 2024-01-14 NOTE — Telephone Encounter (Signed)
 Faxed to [phone] with a completed transmission log

## 2024-01-14 NOTE — Telephone Encounter (Signed)
 Form faxed

## 2024-01-14 NOTE — Progress Notes (Signed)
 01/14/2024 Name: Terri [name] MRN: [id number] DOB: [birth year]  Subjective  Chief Complaint  Patient presents with   Diabetes   Reason for visit: ?  Terri [name] is a 73 y.o. female with a history of diabetes (type 2), who presents today for a CGM teaching/setup visit.   Pertinent PMH also includes atherosclerosis w intermittent claudication, HTN, COPD, HLD, Endometrial cancer, obesity.  Known DM Complications:  nephropathy w microalbuminuria; PAD with claudication BLE; HTN; neuropathy    Care Team: Primary Care Provider: Gretel App, NP Pulmonology Dr. Isadora; Oncology Dr. Jacobo; Cardiology at [facility]   Since Last visit / History of Present Illness: ?  Patient reports doing well overall. Today, patient has brought their Libre 3+ reader with them to the visit. She also brought one Libre 3+ sensor that she found from previous prescription. Sensor is unopened with expiration date of 02/28/2024.   Patient does not have a compatible smart phone.   Reported DM Regimen: ?  Novolog  (aspart) 5-7 units up to three times daily before meals Tresiba U100 20 units daily   DM medications tried in the past:?  Metformin  (diarrhea); switched to glipizide  Ozempic  (pt preference to stop in the setting of chemo-induced weight loss)  SMBG: Glucometer - Checks BG twice daily via glucometer   Hypo/Hyperglycemia: ?  Symptoms of hypoglycemia since last visit:? no  If yes, it was treated by: n/a   Reported Diet:  Patient typically eats 3 meals per day. In the past couple of days has been less hungry which is not usual for her. Notes she still attempts to eat something even if not hungry. Breakfast (8:30): Varies Lunch (12-1pm): Pizza (under 50 carbs); Sandwich; Chicken salad+crackers Dinner (7-7:30 pm): Largest meal of the day Snacks: Sweet tooth (states that most sweets are sugar-free unless having lows).  Exercise: No  DM Prevention:  Statin: Taking; high intensity.?  History of chronic kidney  disease? yes History of albuminuria? yes, last UACR on 01/24/23 = 100.4 mg/g ACE/ARB - Taking; Urine MA/CR Ratio - elevated urinary albumin excretion.  Last foot exam: 02/25/2023 Tobacco Use: Former   Cardiovascular Risk Reduction History of clinical ASCVD? PAD w Claudication The 10-year ASCVD risk score (Arnett DK, et al., 2019) is: 46.9% History of heart failure? no History of hyperlipidemia? yes Current BMI: 30.4 kg/m2 (Ht 62 in, Wt 75.3 kg) Taking statin? yes; high intensity (atorvastaitn 80 mg) Taking aspirin ? indicated (secondary prevention); Taking clopidogrel    Taking SGLT-2i? no Taking GLP- 1 RA? yes     _______________________________________________  Objective    Review of Systems:? Limited in the setting of virtual visit  GI:? No nausea, vomiting, constipation, diarrhea, abdominal pain, dyspepsia, change in bowel habits  Endocrine:? No polyuria, polyphagia or blurred vision    Physical Examination:  Vitals:  Wt Readings from Last 3 Encounters:  01/03/24 140 lb 6.4 oz (63.7 kg)  01/01/24 148 lb (67.1 kg)  12/25/23 157 lb 6.4 oz (71.4 kg)   BP Readings from Last 3 Encounters:  01/03/24 138/70  01/01/24 (!) 106/48  12/25/23 (!) 157/60   Pulse Readings from Last 3 Encounters:  01/03/24 69  01/01/24 60  12/25/23 66   Labs:?  Lab Results  Component Value Date   HGBA1C 6.1 (A) 09/25/2023   HGBA1C 5.7 (A) 06/26/2023   HGBA1C 7.2 (H) 12/25/2022   GLUCOSE 179 (H) 01/01/2024   CREATININE 1.11 (H) 01/01/2024   CREATININE 0.94 12/25/2023   CREATININE 1.34 (H) 12/17/2023   GFR 52.91 (L) 01/24/2023  Lab Results  Component Value Date   CHOL 156 01/24/2023   LDLCALC 87 01/24/2023   HDL 46.20 01/24/2023   TRIG 112.0 01/24/2023   ALT 15 01/01/2024   ALT 13 12/25/2023   AST 19 01/01/2024   AST 18 12/25/2023     Chemistry      Component Value Date/Time   NA 139 01/01/2024 0857   K 3.4 (L) 01/01/2024 0857   CL 102 01/01/2024 0857   CO2 30 01/01/2024 0857    BUN 31 (H) 01/01/2024 0857   CREATININE 1.11 (H) 01/01/2024 0857      Component Value Date/Time   CALCIUM  8.6 (L) 01/01/2024 0857   ALKPHOS 68 01/01/2024 0857   AST 19 01/01/2024 0857   ALT 15 01/01/2024 0857   BILITOT 0.9 01/01/2024 0857     The 10-year ASCVD risk score (Arnett DK, et al., 2019) is: 46.9%  Assessment and Plan:   1. CGM Training Freestyle Libre CGM training provided: System overview of Libre 3 Plus including 15 day sensor wear and appropriate site placements Reviewed trend arrows and treatment decisions, along with noting a 5-10 minute delay in readings and to always confirm low glucose alarms with glucometer if no symptoms.  Reviewed possibility of false compression lows Reviewed troubleshoot guides and to call [employer] Customer Support for technical issues or difficulties. Libre 3 Reader was set up successfully. Does not have compatible smart phone.  Patient successfully placed a Libre sensor under supervision in clinic. Provided patient with handful of sample overlay patches. Overpatch also placed in clinic over current sensor.     Follow up PharmD follow up via phone 2 weeks to review first month of freestyle readings  PCP f/u scheduled 10/7   Terri [name], PharmD Clinical Pharmacist [facility] [phone]

## 2024-01-14 NOTE — Patient Instructions (Signed)
 [name],   It was a pleasure to speak with you today! As we discussed:?   Sensor Application Apply Sensors only on the back of your upper arm. If placed in other areas, the Sensor may not function properly and could give you inaccurate readings. Avoid areas with scars, moles, stretch marks, or lumps.   Select an area of skin that generally stays flat during your normal daily activities (no bending or folding). To prevent discomfort or skin irritation, you should select a different site other than the one most recently used. Wash application site using a plain soap or clean with an alcohol wipe. This will help remove any oily residue that may prevent the sensor from sticking properly. Allow site to air dry before proceeding. Note: The area MUST be clean and dry, or the Sensor may not stay on for the full wear duration specified by your Sensor insert. 4. Unscrew the cap from the Sensor 5. Place the Sensor Applicator over the prepared site and push down firmly to apply the Sensor to your body. 6. Gently pull the Sensor Applicator away from your body. The Sensor should now be attached to your skin. 7. Make sure the Sensor is secure after application. Put the cap back on the Sensor Applicator. Discard the used Engineer, agricultural in the trash   What If My Sensor Falls Off or What If My Sensor Isn't Working? Call [employer] Customer Care Team at [phone] Available 7 days a week from 8AM-8PM EST, excluding holidays  How to Prevent Your Sensor From Falling Off Early? Make sure you don't have lotion on your arms/abdomen before applying the sensor.  Use alcohol to clean the area of oils. Make sure your skin is clean and dry before applying a new sensor. You can go to your local pharmacy and ask for Tegaderm, or something similar.  It is a clear, waterproof patch that you can lay over the sensor to help it to stay on longer. Or you can order patches from amazon (or elsewhere online). Try  searching Libre patch on [employer]. Be sure to call [name] and ask for replacements for the sensors that have fallen off.  They will offer to replace at least some of the lost sensors.   [name], PharmD Clinical Pharmacist [facility] [phone]

## 2024-01-15 ENCOUNTER — Encounter

## 2024-01-15 ENCOUNTER — Ambulatory Visit

## 2024-01-16 ENCOUNTER — Encounter: Payer: Self-pay | Admitting: Oncology

## 2024-01-16 ENCOUNTER — Ambulatory Visit

## 2024-01-21 ENCOUNTER — Ambulatory Visit

## 2024-01-23 ENCOUNTER — Ambulatory Visit

## 2024-01-29 ENCOUNTER — Telehealth: Payer: Self-pay | Admitting: *Deleted

## 2024-01-29 NOTE — Telephone Encounter (Signed)
 Patient notified that Patient Assistance Medication are in the office & are ready for pick up.   Medication: Elverna QUEST  Quantity: 2 boxes  Lot# [id number]  Exp: 03/29/2026

## 2024-01-31 ENCOUNTER — Inpatient Hospital Stay: Attending: Obstetrics and Gynecology

## 2024-01-31 ENCOUNTER — Ambulatory Visit
Admission: RE | Admit: 2024-01-31 | Discharge: 2024-01-31 | Disposition: A | Discharge status: Home / Self Care | Source: Ambulatory Visit | Attending: Oncology | Admitting: Oncology

## 2024-01-31 DIAGNOSIS — C541 Malignant neoplasm of endometrium: Secondary | ICD-10-CM | POA: Insufficient documentation

## 2024-01-31 DIAGNOSIS — S32018D Other fracture of first lumbar vertebra, subsequent encounter for fracture with routine healing: Secondary | ICD-10-CM | POA: Diagnosis not present

## 2024-01-31 DIAGNOSIS — C775 Secondary and unspecified malignant neoplasm of intrapelvic lymph nodes: Secondary | ICD-10-CM | POA: Insufficient documentation

## 2024-01-31 DIAGNOSIS — S2231XD Fracture of one rib, right side, subsequent encounter for fracture with routine healing: Secondary | ICD-10-CM | POA: Diagnosis not present

## 2024-01-31 DIAGNOSIS — R531 Weakness: Secondary | ICD-10-CM | POA: Insufficient documentation

## 2024-01-31 DIAGNOSIS — I700 Atherosclerosis of aorta: Secondary | ICD-10-CM | POA: Insufficient documentation

## 2024-01-31 DIAGNOSIS — S32048D Other fracture of fourth lumbar vertebra, subsequent encounter for fracture with routine healing: Secondary | ICD-10-CM | POA: Diagnosis not present

## 2024-01-31 DIAGNOSIS — S32028D Other fracture of second lumbar vertebra, subsequent encounter for fracture with routine healing: Secondary | ICD-10-CM | POA: Diagnosis not present

## 2024-01-31 DIAGNOSIS — Z9181 History of falling: Secondary | ICD-10-CM | POA: Insufficient documentation

## 2024-01-31 DIAGNOSIS — E1165 Type 2 diabetes mellitus with hyperglycemia: Secondary | ICD-10-CM | POA: Insufficient documentation

## 2024-01-31 DIAGNOSIS — G620 Drug-induced polyneuropathy: Secondary | ICD-10-CM | POA: Insufficient documentation

## 2024-01-31 DIAGNOSIS — S32038D Other fracture of third lumbar vertebra, subsequent encounter for fracture with routine healing: Secondary | ICD-10-CM | POA: Insufficient documentation

## 2024-01-31 DIAGNOSIS — C7982 Secondary malignant neoplasm of genital organs: Secondary | ICD-10-CM | POA: Insufficient documentation

## 2024-01-31 DIAGNOSIS — D649 Anemia, unspecified: Secondary | ICD-10-CM | POA: Insufficient documentation

## 2024-01-31 DIAGNOSIS — I2510 Atherosclerotic heart disease of native coronary artery without angina pectoris: Secondary | ICD-10-CM | POA: Diagnosis not present

## 2024-01-31 DIAGNOSIS — R634 Abnormal weight loss: Secondary | ICD-10-CM | POA: Insufficient documentation

## 2024-01-31 DIAGNOSIS — N939 Abnormal uterine and vaginal bleeding, unspecified: Secondary | ICD-10-CM | POA: Insufficient documentation

## 2024-01-31 DIAGNOSIS — N289 Disorder of kidney and ureter, unspecified: Secondary | ICD-10-CM | POA: Insufficient documentation

## 2024-01-31 DIAGNOSIS — R630 Anorexia: Secondary | ICD-10-CM | POA: Insufficient documentation

## 2024-01-31 LAB — GLUCOSE, CAPILLARY: Glucose-Capillary: 98 mg/dL (ref 70–99)

## 2024-01-31 MED ORDER — FLUDEOXYGLUCOSE F - 18 (FDG) INJECTION
7.8900 | Freq: Once | INTRAVENOUS | Status: AC | PRN
  Administered 2024-01-31: 7.89 via INTRAVENOUS

## 2024-02-04 ENCOUNTER — Encounter: Payer: Self-pay | Admitting: Nurse Practitioner

## 2024-02-04 ENCOUNTER — Ambulatory Visit (INDEPENDENT_AMBULATORY_CARE_PROVIDER_SITE_OTHER): Admitting: Nurse Practitioner

## 2024-02-04 ENCOUNTER — Other Ambulatory Visit: Payer: Self-pay | Admitting: *Deleted

## 2024-02-04 VITALS — BP 138/62 | HR 55 | Temp 97.8°F | Ht 62.0 in | Wt 151.2 lb

## 2024-02-04 DIAGNOSIS — E1169 Type 2 diabetes mellitus with other specified complication: Secondary | ICD-10-CM

## 2024-02-04 DIAGNOSIS — C541 Malignant neoplasm of endometrium: Secondary | ICD-10-CM | POA: Diagnosis not present

## 2024-02-04 DIAGNOSIS — G629 Polyneuropathy, unspecified: Secondary | ICD-10-CM

## 2024-02-04 DIAGNOSIS — E785 Hyperlipidemia, unspecified: Secondary | ICD-10-CM

## 2024-02-04 DIAGNOSIS — H9193 Unspecified hearing loss, bilateral: Secondary | ICD-10-CM | POA: Diagnosis not present

## 2024-02-04 DIAGNOSIS — Z794 Long term (current) use of insulin: Secondary | ICD-10-CM

## 2024-02-04 DIAGNOSIS — I152 Hypertension secondary to endocrine disorders: Secondary | ICD-10-CM

## 2024-02-04 DIAGNOSIS — E1159 Type 2 diabetes mellitus with other circulatory complications: Secondary | ICD-10-CM

## 2024-02-04 DIAGNOSIS — E118 Type 2 diabetes mellitus with unspecified complications: Secondary | ICD-10-CM | POA: Diagnosis not present

## 2024-02-04 LAB — POCT GLYCOSYLATED HEMOGLOBIN (HGB A1C)
HbA1c POC (<> result, manual entry): 6.2 % (ref 4.0–5.6)
HbA1c, POC (controlled diabetic range): 6.2 % (ref 0.0–7.0)
HbA1c, POC (prediabetic range): 6.2 % (ref 5.7–6.4)
Hemoglobin A1C: 6.2 % — AB (ref 4.0–5.6)

## 2024-02-04 MED ORDER — FREESTYLE LIBRE 3 PLUS SENSOR MISC: 3 refills | Status: DC

## 2024-02-04 NOTE — Progress Notes (Signed)
 Leron Glance, NP-C Phone: [phone]  Terri [name] is a 73 y.o. female who presents today for follow up.   Discussed the use of AI scribe software for clinical note transcription with the patient, who gave verbal consent to proceed.  History of Present Illness   Terri [name] is a 73 year old female with diabetes and neuropathy who presents for medication management and follow-up.  She experiences neuropathy primarily in her left leg, which is the worst affected. She uses a walker for mobility, especially for longer distances, and holds onto objects for support at home. She has not been taking gabapentin as it was changed to Lyrica (pregabalin) by her neurologist. She is waiting for her prescription to arrive from [facility].   Her diabetes management includes [place] once daily and Novolog  with meals. She monitors her blood sugar, which fluctuates between very low (as low as 69) and high (up to 200). No excessive thirst or urination. Her A1c is currently 6.2. She is on a patient assistance program for insulin .  She is experiencing hearing loss and is seeking a referral to an audiologist. She mentions a past PET scan indicating a possible increase in tumor size, and she has completed chemotherapy but is unsure of the current status of her cancer. She has declined physical therapy due to scheduling conflicts and transportation issues.  Her current medications include metoprolol , losartan , hydrochlorothiazide , amlodipine , clonidine , and Lipitor. She does not regularly check her blood pressure but takes her medications as prescribed. She mentions a history of receiving calls she could not answer due to mobility issues and has recently acquired a new phone to help manage this.      Social History   Tobacco Use  Smoking Status Former   Current packs/day: 1.00   Average packs/day: 1 pack/day for 40.0 years (40.0 ttl pk-yrs)   Types: Cigarettes  Smokeless Tobacco Never  Tobacco Comments    Quite 2015    Current Outpatient Medications on File Prior to Visit  Medication Sig Dispense Refill   albuterol  (VENTOLIN  HFA) 108 (90 Base) MCG/ACT inhaler Inhale 2 puffs into the lungs every 6 (six) hours as needed for wheezing or shortness of breath. 1 each 1   amLODipine  (NORVASC ) 10 MG tablet Take 1 tablet (10 mg total) by mouth daily. 90 tablet 3   aspirin  EC 81 MG tablet Take 1 tablet (81 mg total) by mouth daily. Swallow whole. 150 tablet 2   atorvastatin  (LIPITOR) 80 MG tablet Take 1 tablet (80 mg total) by mouth daily. 90 tablet 3   budesonide -glycopyrrolate-formoterol  (BREZTRI) 160-9-4.8 MCG/ACT AERO inhaler Inhale 2 puffs into the lungs 2 (two) times daily. Via PAP AZ&Me (Patient taking differently: Inhale 1 puff into the lungs 2 (two) times daily. Via PAP AZ&Me)     buPROPion  (WELLBUTRIN  XL) 300 MG 24 hr tablet Take 1 tablet (300 mg total) by mouth daily. 90 tablet 0   Cholecalciferol  (VITAMIN D3) 50 MCG (2000 UT) capsule Take 2,000 Units by mouth daily.     citalopram  (CELEXA ) 20 MG tablet Take 1 tablet (20 mg total) by mouth daily. 90 tablet 3   cloNIDine  (CATAPRES ) 0.2 MG tablet Take 1 tablet (0.2 mg total) by mouth 2 (two) times daily. 180 tablet 3   clopidogrel  (PLAVIX ) 75 MG tablet Take 1 tablet (75 mg total) by mouth daily. 90 tablet 3   cyanocobalamin  (VITAMIN B12) 1000 MCG tablet Take 1 tablet (1,000 mcg total) by mouth daily. 90 tablet 3   insulin  aspart (  NOVOLOG ) 100 UNIT/ML injection Inject 5 Units into the skin 3 (three) times daily before meals. (Patient taking differently: Inject 6 Units into the skin 3 (three) times daily before meals.)     insulin  degludec (TRESIBA FLEXTOUCH) 100 UNIT/ML FlexTouch Pen Inject 20 Units into the skin daily. 20 units once daily - REPLACES Levemir      Iron , Ferrous Sulfate , 325 (65 Fe) MG TABS Take 1 tablet by mouth daily. 90 tablet 3   losartan -hydrochlorothiazide  (HYZAAR) 100-25 MG tablet Take 1 tablet by mouth daily. 90 tablet 3    megestrol  (MEGACE ) 40 MG tablet Take 1 tablet (40 mg total) by mouth daily. 30 tablet 2   metoprolol  succinate (TOPROL -XL) 50 MG 24 hr tablet Take 1 tablet (50 mg total) by mouth daily. 90 tablet 0   montelukast  (SINGULAIR ) 10 MG tablet Take 10 mg by mouth at bedtime.     ondansetron  (ZOFRAN ) 8 MG tablet Take 1 tablet (8 mg total) by mouth every 8 (eight) hours as needed for nausea or vomiting. (Patient not taking: Reported on 02/04/2024) 30 tablet 0   No current facility-administered medications on file prior to visit.     ROS see history of present illness  Objective  Physical Exam Vitals:   02/04/24 1036  BP: 138/62  Pulse: (!) 55  Temp: 97.8 F (36.6 C)  SpO2: 99%    BP Readings from Last 3 Encounters:  02/04/24 138/62  01/03/24 138/70  01/01/24 (!) 106/48   Wt Readings from Last 3 Encounters:  02/04/24 151 lb 3.2 oz (68.6 kg)  01/03/24 140 lb 6.4 oz (63.7 kg)  01/01/24 148 lb (67.1 kg)    Physical Exam Constitutional:      General: She is not in acute distress.    Appearance: Normal appearance.  HENT:     Head: Normocephalic.  Cardiovascular:     Rate and Rhythm: Normal rate and regular rhythm.     Heart sounds: Normal heart sounds.  Pulmonary:     Effort: Pulmonary effort is normal.     Breath sounds: Normal breath sounds.  Skin:    General: Skin is warm and dry.  Neurological:     General: No focal deficit present.     Mental Status: She is alert.  Psychiatric:        Mood and Affect: Mood normal.        Behavior: Behavior normal.      Assessment/Plan: Please see individual problem list.  Type 2 diabetes with complication (HCC) Assessment & Plan: Blood sugar levels fluctuate with occasional hypoglycemia and hyperglycemia, but A1c is well-controlled at 6.2%. Advise caution with insulin  dosing to avoid hypoglycemia. Continue Tresiba and Novolog  as prescribed. FreeStyle sensors refilled.   Orders: -     POCT glycosylated hemoglobin (Hb A1C) -      FreeStyle Libre 3 Plus Sensor; Change sensor every 15 days.  Dispense: 6 each; Refill: 3  Decreased hearing of both ears Assessment & Plan: Progressive hearing loss reported. Send referral to audiologist for evaluation.   Orders: -     Ambulatory referral to ENT  Neuropathy Assessment & Plan: Gabapentin discontinued by neurology. Continue Lyrica 150 mg twice daily. Follow up as scheduled.    Endometrial cancer [facility]) Assessment & Plan: Currently undergoing chemotherapy. Treatment effectiveness and potential hospice care transition have been evaluated. Recent PET scan showed disease progression. Continue the chemotherapy regimen and follow up with oncology tomorrow.    Hyperlipidemia associated with type 2 diabetes mellitus (HCC) Assessment &  Plan: Managed with Lipitor 80 mg daily. Continue.    Hypertension associated with diabetes (HCC) Assessment & Plan: Blood pressure is adequately controlled with the current medication regimen. Continue metoprolol , losartan , hydrochlorothiazide , amlodipine , and clonidine .       Return in about 3 months (around 05/06/2024) for Follow up.   Leron Glance, NP-C [facility]

## 2024-02-04 NOTE — Assessment & Plan Note (Signed)
 Gabapentin discontinued by neurology. Continue Lyrica 150 mg twice daily. Follow up as scheduled.

## 2024-02-04 NOTE — Assessment & Plan Note (Signed)
 Progressive hearing loss reported. Send referral to audiologist for evaluation.

## 2024-02-04 NOTE — Assessment & Plan Note (Signed)
 Blood sugar levels fluctuate with occasional hypoglycemia and hyperglycemia, but A1c is well-controlled at 6.2%. Advise caution with insulin  dosing to avoid hypoglycemia. Continue Tresiba and Novolog  as prescribed. FreeStyle sensors refilled.

## 2024-02-04 NOTE — Assessment & Plan Note (Signed)
 Currently undergoing chemotherapy. Treatment effectiveness and potential hospice care transition have been evaluated. Recent PET scan showed disease progression. Continue the chemotherapy regimen and follow up with oncology tomorrow.

## 2024-02-04 NOTE — Assessment & Plan Note (Signed)
 Blood pressure is adequately controlled with the current medication regimen. Continue metoprolol , losartan , hydrochlorothiazide , amlodipine , and clonidine .

## 2024-02-04 NOTE — Assessment & Plan Note (Signed)
 Managed with Lipitor 80 mg daily. Continue.

## 2024-02-05 ENCOUNTER — Inpatient Hospital Stay: Admitting: Oncology

## 2024-02-05 ENCOUNTER — Encounter: Payer: Self-pay | Admitting: Obstetrics and Gynecology

## 2024-02-05 ENCOUNTER — Inpatient Hospital Stay

## 2024-02-05 ENCOUNTER — Encounter: Payer: Self-pay | Admitting: Oncology

## 2024-02-05 ENCOUNTER — Inpatient Hospital Stay (HOSPITAL_BASED_OUTPATIENT_CLINIC_OR_DEPARTMENT_OTHER): Admitting: Obstetrics and Gynecology

## 2024-02-05 VITALS — BP 115/82 | HR 60 | Temp 97.9°F | Resp 16 | Wt 154.0 lb

## 2024-02-05 DIAGNOSIS — R531 Weakness: Secondary | ICD-10-CM | POA: Diagnosis not present

## 2024-02-05 DIAGNOSIS — C541 Malignant neoplasm of endometrium: Secondary | ICD-10-CM

## 2024-02-05 DIAGNOSIS — E1165 Type 2 diabetes mellitus with hyperglycemia: Secondary | ICD-10-CM | POA: Diagnosis not present

## 2024-02-05 DIAGNOSIS — N939 Abnormal uterine and vaginal bleeding, unspecified: Secondary | ICD-10-CM | POA: Diagnosis not present

## 2024-02-05 DIAGNOSIS — C775 Secondary and unspecified malignant neoplasm of intrapelvic lymph nodes: Secondary | ICD-10-CM | POA: Diagnosis not present

## 2024-02-05 DIAGNOSIS — R634 Abnormal weight loss: Secondary | ICD-10-CM | POA: Diagnosis not present

## 2024-02-05 DIAGNOSIS — D649 Anemia, unspecified: Secondary | ICD-10-CM | POA: Diagnosis not present

## 2024-02-05 DIAGNOSIS — G620 Drug-induced polyneuropathy: Secondary | ICD-10-CM | POA: Diagnosis not present

## 2024-02-05 DIAGNOSIS — C7982 Secondary malignant neoplasm of genital organs: Secondary | ICD-10-CM | POA: Diagnosis not present

## 2024-02-05 DIAGNOSIS — Z9181 History of falling: Secondary | ICD-10-CM | POA: Diagnosis not present

## 2024-02-05 DIAGNOSIS — R630 Anorexia: Secondary | ICD-10-CM | POA: Diagnosis not present

## 2024-02-05 DIAGNOSIS — N289 Disorder of kidney and ureter, unspecified: Secondary | ICD-10-CM | POA: Diagnosis not present

## 2024-02-05 LAB — CMP (CANCER CENTER ONLY)
ALT: 12 U/L (ref 0–44)
AST: 17 U/L (ref 15–41)
Albumin: 3.2 g/dL — ABNORMAL LOW (ref 3.5–5.0)
Alkaline Phosphatase: 68 U/L (ref 38–126)
Anion gap: 9 (ref 5–15)
BUN: 34 mg/dL — ABNORMAL HIGH (ref 8–23)
CO2: 28 mmol/L (ref 22–32)
Calcium: 8.4 mg/dL — ABNORMAL LOW (ref 8.9–10.3)
Chloride: 98 mmol/L (ref 98–111)
Creatinine: 1.22 mg/dL — ABNORMAL HIGH (ref 0.44–1.00)
GFR, Estimated: 47 mL/min — ABNORMAL LOW (ref >60)
Glucose, Bld: 246 mg/dL — ABNORMAL HIGH (ref 70–99)
Potassium: 3.3 mmol/L — ABNORMAL LOW (ref 3.5–5.1)
Sodium: 135 mmol/L (ref 135–145)
Total Bilirubin: 0.4 mg/dL (ref 0.0–1.2)
Total Protein: 5.4 g/dL — ABNORMAL LOW (ref 6.5–8.1)

## 2024-02-05 LAB — CBC WITH DIFFERENTIAL/PLATELET
Abs Immature Granulocytes: 0.02 K/uL (ref 0.00–0.07)
Basophils Absolute: 0 K/uL (ref 0.0–0.1)
Basophils Relative: 0 %
Eosinophils Absolute: 0.2 K/uL (ref 0.0–0.5)
Eosinophils Relative: 4 %
HCT: 30.3 % — ABNORMAL LOW (ref 36.0–46.0)
Hemoglobin: 10 g/dL — ABNORMAL LOW (ref 12.0–15.0)
Immature Granulocytes: 0 %
Lymphocytes Relative: 11 %
Lymphs Abs: 0.5 K/uL — ABNORMAL LOW (ref 0.7–4.0)
MCH: 28.6 pg (ref 26.0–34.0)
MCHC: 33 g/dL (ref 30.0–36.0)
MCV: 86.6 fL (ref 80.0–100.0)
Monocytes Absolute: 0.5 K/uL (ref 0.1–1.0)
Monocytes Relative: 10 %
Neutro Abs: 3.6 K/uL (ref 1.7–7.7)
Neutrophils Relative %: 75 %
Platelets: 162 K/uL (ref 150–400)
RBC: 3.5 MIL/uL — ABNORMAL LOW (ref 3.87–5.11)
RDW: 16.2 % — ABNORMAL HIGH (ref 11.5–15.5)
WBC: 4.8 K/uL (ref 4.0–10.5)
nRBC: 0 % (ref 0.0–0.2)

## 2024-02-05 LAB — MAGNESIUM: Magnesium: 1.9 mg/dL (ref 1.7–2.4)

## 2024-02-05 LAB — SAMPLE TO BLOOD BANK

## 2024-02-05 NOTE — Progress Notes (Signed)
 Gynecologic Oncology Consult Visit   Referring Provider: Dr. 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 female who is seen in consultation from Dr. Leonce for locally advanced endometrial cancer, currently on weekly taxol  since March 2025 who had recent PET scan and returns to clinic for pelvic exam.    Here for follow up s/p 6 cycles weekly taxol . Complains of ongoing vaginal bleeding. Has noticed changes in her hearing and worsening peripheral neuropathy.  Barely able to walk.   In interim she has had a PET scan which showed persistent hypermetabolic activity in uterus, slightly increased.  No metastatic disease. .   Treatment Summary:  03/13/22- 04/18/22- pelvic radiation for extensive vaginal involvement of malignancy- 50 Gy 05/15/22- CT C/A/P consistent with residual endometrial primary, borderline common iliac nodes similar to pet and not hypermetabolic. Scattered pulmonary nodules.  06/20/22- Cycle 1 Carbo-Taxol -Keytruda  07/11/22- Cycle 2 Carbo-taxol -keytruda  08/01/22- Cycle 3 Carbo-taxol -keytruda  08/14/22- PET - consistent with response to therapy 08/22/22- Cycle 4 Carbo-taxol -keytruda  09/12/22- Cycle 5 carbo-taxol -keytruda  10/03/22- Cycle 6 carbo-taxol -keytruda  10/12/22- CT C/A/P - unchanged appearance. No worsening disease. Radiation effects.  10/23/22- maintenance keytruda   11/14/22- keytruda  12/05/22- keytruda  12/26/22- keytruda  01/15/23- PET concerning for progressive disease in lower uterine segment and iliac nodes 01/16/23- AGUS favoring cancer Pap 02/06/23- Keytruda  02/27/23- Keytruda ; Foundation One Sent. Second line doxil  discussed. Felt not to be a surgical candidate.  02/27/23- Cervix, biopsy, - endometrial carcinoma 04/10/23- D1C1 - Doxil  50 mg/m2 05/08/23- D1C2 Doxil  50 mg/m2 06/05/23- D1C3 Doxil  50 mg/m2 06/2023- vaginal bleeding 07/11/23- started weekly paclitaxel  80 mg/m2 01/01/24-  completed 6 cycles of paclitaxel      Gyn Oncology History She was referred by Dr. Auston to Dr. Leonce for reports of postmenopausal bleeding for 6 months, may be more.  Additionally has urinary incontinence.   On exam, friable cervical vs vaginal mass was partially visualized though exam was limited. Biopsy was performed.  Cervical Biopsy: poorly differentiated carcinoma, favor endometrial origin. Positive for p53 and ER. Focal staining for p16, negative for napsin and p63. HER2- 1+ negative. MSI/MMR- Stable Colonoscopy was scheduled but prep inadequate.    PET/CT EXAM: 10/23 Marked diffuse hypermetabolic activity throughout the entire uterus and cervix, consistent with malignancy. This favors endometrial carcinoma over cervical carcinoma. Borderline enlarged bilateral common iliac lymph nodes noted, but without FDG uptake. No definite evidence of metastatic disease by PET.  Patient seen 01/24/22 and found to have extensive cervical and upper vaginal involvement.  Only having light bleeding.  Decision made to treat her with primary radiation therapy because of extensive involvement of cervix and adjacent vagina, and she was reluctant to consider chemotherapy.  She received primary pelvic external radiation 50 Gy, completed 04/18/22.   05/15/22- CT Chest abdomen Pelvis w contrast IMPRESSION: 1. Central uterine hypoattenuation likely represents residual endometrial primary. 2. Borderline sized common iliac nodes are similar to the prior PET and were not hypermetabolic on that exam. Favored to be reactive. Otherwise, no evidence of metastatic disease in the abdomen or pelvis. 3. Scattered tiny pulmonary nodules are subpleural predominant and favored to represent subpleural lymph nodes. These can be re-evaluated at follow-up.  Treated with Carboplatin /Taxol  and Keytruda  x 3 cycles with excellent clinical response clinically.   CT/PET 4/24 shows response in the uterus. IMPRESSION: 1. Interval  decrease in radiotracer uptake associated with the uterus compatible with response to therapy. SUV max is currently equal to 4.22, compared with 21.8  previously. 2. No significant change in size of bilateral common iliac lymph nodes with mild, nonspecific FDG uptake. 3. No new sites of disease identified. 4. Diffusely increased bone marrow activity is new compared with the previous exam and likely reflects treatment related changes.  PAP 01/16/23 AGUS favor cancer  PET scan  01/15/23  - IMPRESSION: Mild residual hypermetabolism in the lower uterine segment, nonspecific. Residual/viable tumor is not excluded.   - Small bilateral common iliac nodes, with associated mild hypermetabolism, suggesting small nodal metastases.  - Healing fractures of the right anterior 3rd and 4th ribs, posttraumatic.  02/27/23- Cervix, biopsy  INVOLVEMENT BY PATIENT'S KNOWN ENDOMETRIAL CARCINOMA.  She was felt to not be a good candidate for surgery due to extent of vaginal recurrence and PET positive common iliac nodes in addition to radiation effect in the vagina potentially complicating healing. Additionally, she has significant medical comorbidities and had been falling. We discussed options including doxil  vs clinical trial with TROP2 ADC however she declined trial d/t concerns with transportation.   Foundation testing was sent - 03/06/23- HRDsig Negative, MS stable, TMB 0, PTEN loss exons 4-9, PPP2R1A S256F, TP53 R282W  She was admitted to hospital in late November 2024 for hypoxia secondary to COPD exacerbation. She was treated with steroids and antibiotics.  04/10/23- started Doxil  chemotherapy She received 3 cycles of Doxil .   She began having vaginal bleeding x 1 month and declined evaluation. She suffered a fall 06/2023 while leaning forward from chair and suffered contusions, laceration, and hematoma to face.  Feb 2025- vaginal bleeding/spotting x 1 month  06/14/23- PET  IMPRESSION: 1. Significant and  progressive hypermetabolism in the endometrium consistent with recurrent/progressive endometrial cancer. 2. Enlarging right common iliac node with increasing hypermetabolism consistent with metastatic disease. Other small common iliac and external iliac lymph nodes are stable. 3. No findings for other sites of metastatic disease. 4. New scattered tree-in-bud type nodularity suggesting chronic inflammation or atypical infection such as MAC. This is most notable in the right lung.  PET scan 10/15/23 FINDINGS: ABDOMEN/PELVIS: Metabolic activity again demonstrated centrally within the uterus with SUV max 11.8 compared SUV max equal 16.5 4A reduction activity.   No radiotracer avid pelvic lymph nodes. No retroperitoneal nodes. No liver metastasis.   Resolution of previously hypermetabolic RIGHT common iliac lymph node.   Incidental CT findings: None.   SKELETON: Metabolic activity associated with a healed RIGHT rib fracture on image 56. favor benign posttraumatic findings. Multiple additional healed rib fractures on the RIGHT. Potential re-injury of this rib   Incidental CT findings: None.   IMPRESSION: 1. Decreased metabolic activity within the endometrium. 2. Resolution RIGHT common iliac lymph node metabolic activity. 3. No hypermetabolic pelvic or abdominal lymph nodes 4. No distant metastatic disease.  Genetic Testing  Tumor Testing: FoundationOne CDx: 03/06/23- HRDsig Negative, MS stable, TMB 0, PTEN loss exons 4-9, PPP2R1A S256F, TP53 R282W  Testing on 12/28/2021 specimen P53 positive, ER positive  Problem List: Patient Active Problem List   Diagnosis Date Noted   Neuropathy 02/04/2024   Decreased hearing of both ears 02/04/2024   Rash 07/01/2023   Muscle strain of chest wall 07/01/2023   Peripheral vascular disease 05/14/2023   Personal history of fall 04/28/2023   COPD exacerbation (HCC) 03/29/2023   Dyspnea on exertion 02/02/2023   Vitamin D  deficiency 02/02/2023    Vitamin B 12 deficiency 02/02/2023   Diabetic eye exam (HCC) 02/02/2023   Aortic atherosclerosis 02/02/2023   Bradycardia 01/14/2023   Anemia 09/28/2022  Postmenopausal bleeding 01/30/2022   Endometrial cancer (HCC) 01/25/2022   Mild aortic stenosis 10/12/2020   Acute respiratory failure with hypoxia (HCC) 08/05/2015   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   Class 2 severe obesity due to excess calories with serious comorbidity in adult 08/19/2013   Obesity, Class II, BMI 35-39.9, with comorbidity 08/19/2013   Chronic obstructive pulmonary disease, unspecified (HCC) 07/31/2012   Asthma with chronic obstructive pulmonary disease (COPD) (HCC) 07/31/2012   Atherosclerosis of native arteries of extremity with intermittent claudication 03/12/2012   Tobacco use 03/12/2012   Hyperlipidemia associated with type 2 diabetes mellitus (HCC) 06/06/2011   Hypertension associated with diabetes (HCC) 06/06/2011   Mood disorder 06/06/2011   Type 2 diabetes with complication (HCC) 06/06/2011    Past Medical History: Past Medical History:  Diagnosis Date   Adenomatous colon polyp    Aortic stenosis    Arthritis    Asthma    Cancer (HCC)    Claudication    COPD (chronic obstructive pulmonary disease) (HCC)    Depression    Diabetes mellitus without complication (HCC)    Esophageal dysphagia    GERD (gastroesophageal reflux disease)    Hyperlipemia    Hypertension    Obesity    Severe obesity (BMI 35.0-35.9 with comorbidity) (HCC)     Past Surgical History: Past Surgical History:  Procedure Laterality Date   BLADDER SURGERY     COLONOSCOPY WITH PROPOFOL  N/A 08/05/2015   Procedure: COLONOSCOPY WITH PROPOFOL ;  Surgeon: Donnice Vaughn Manes, MD;  Location: [facility] ENDOSCOPY;  Service: Endoscopy;  Laterality: N/A;   ESOPHAGOGASTRODUODENOSCOPY N/A 01/09/2022   Procedure: ESOPHAGOGASTRODUODENOSCOPY (EGD);  Surgeon: Maryruth Ole DASEN, MD;  Location: [facility] ENDOSCOPY;  Service: Endoscopy;  Laterality: N/A;    FOOT SURGERY Left    FRACTURE SURGERY     IR CV LINE INJECTION  06/22/2022   IR IMAGING GUIDED PORT INSERTION  06/05/2022   IR PORT REPAIR CENTRAL VENOUS ACCESS DEVICE  07/02/2022   LOWER EXTREMITY ANGIOGRAPHY Left 02/04/2023   Procedure: Lower Extremity Angiography;  Surgeon: Marea Selinda RAMAN, MD;  Location: [facility] INVASIVE CV LAB;  Service: Cardiovascular;  Laterality: Left;   ORIF ANKLE FRACTURE Left 03/26/2017   Procedure: OPEN REDUCTION INTERNAL FIXATION (ORIF) ANKLE FRACTURE;  Surgeon: Edie Norleen PARAS, MD;  Location: [facility] ORS;  Service: Orthopedics;  Laterality: Left;    OB History:  G17P0 OB History  No obstetric history on file.    Family History: Family History  Problem Relation Age of Onset   CVA Mother    Diabetes Mother    CAD Father    Breast cancer Neg Hx     Social History: Social History   Socioeconomic History   Marital status: Single    Spouse name: Not on file   Number of children: Not on file   Years of education: Not on file   Highest education level: Not on file  Occupational History   Not on file  Tobacco Use   Smoking status: Former    Current packs/day: 1.00    Average packs/day: 1 pack/day for 40.0 years (40.0 ttl pk-yrs)    Types: Cigarettes   Smokeless tobacco: Never   Tobacco comments:    Quite 2015  Vaping Use   Vaping status: Never Used  Substance and Sexual Activity   Alcohol use: No   Drug use: No   Sexual activity: Not on file  Other Topics Concern   Not on file  Social History Narrative   Lives alone.  Alyse Clarity, here with her today.  Indoor cats.     Social Drivers of Corporate investment banker Strain: Low Risk  (12/20/2022)   Received from [facility]   Overall Financial Resource Strain (CARDIA)    Difficulty of Paying Living Expenses: Not very hard  Food Insecurity: No Food Insecurity (12/20/2022)   Received from [facility]   Hunger Vital Sign    Within the past 12 months, you worried that  your food would run out before you got the money to buy more.: Never true    Within the past 12 months, the food you bought just didn't last and you didn't have money to get more.: Never true  Transportation Needs: Unknown (12/20/2022)   Received from [facility] - Transportation    In the past 12 months, has lack of transportation kept you from medical appointments or from getting medications?: No    Lack of Transportation (Non-Medical): Not on file  Physical Activity: Inactive (09/20/2022)   Exercise Vital Sign    Days of Exercise per Week: 0 days    Minutes of Exercise per Session: 0 min  Stress: Stress Concern Present (09/20/2022)   [employer] of Occupational Health - Occupational Stress Questionnaire    Feeling of Stress : To some extent  Social Connections: Socially Isolated (09/20/2022)   Social Connection and Isolation Panel    Frequency of Communication with Friends and Family: Once a week    Frequency of Social Gatherings with Friends and Family: Never    Attends Religious Services: Never    Database administrator or Organizations: No    Attends Banker Meetings: Never    Marital Status: Never married  Intimate Partner Violence: Not At Risk (05/30/2022)   Humiliation, Afraid, Rape, and Kick questionnaire    Fear of Current or Ex-Partner: No    Emotionally Abused: No    Physically Abused: No    Sexually Abused: No    Allergies: No Known Allergies  Current Medications: Current Outpatient Medications  Medication Sig Dispense Refill   albuterol  (VENTOLIN  HFA) 108 (90 Base) MCG/ACT inhaler Inhale 2 puffs into the lungs every 6 (six) hours as needed for wheezing or shortness of breath. 1 each 1   amLODipine  (NORVASC ) 10 MG tablet Take 1 tablet (10 mg total) by mouth daily. 90 tablet 3   atorvastatin  (LIPITOR) 80 MG tablet Take 1 tablet (80 mg total) by mouth daily. 90 tablet 3   budesonide -glycopyrrolate-formoterol  (BREZTRI)  160-9-4.8 MCG/ACT AERO inhaler Inhale 2 puffs into the lungs 2 (two) times daily. Via PAP AZ&Me (Patient taking differently: Inhale 1 puff into the lungs 2 (two) times daily. Via PAP AZ&Me)     buPROPion  (WELLBUTRIN  XL) 300 MG 24 hr tablet Take 1 tablet (300 mg total) by mouth daily. 90 tablet 0   Cholecalciferol  (VITAMIN D3) 50 MCG (2000 UT) capsule Take 2,000 Units by mouth daily.     citalopram  (CELEXA ) 20 MG tablet Take 1 tablet (20 mg total) by mouth daily. 90 tablet 3   cloNIDine  (CATAPRES ) 0.2 MG tablet Take 1 tablet (0.2 mg total) by mouth 2 (two) times daily. 180 tablet 3   clopidogrel  (PLAVIX ) 75 MG tablet Take 1 tablet (75 mg total) by mouth daily. 90 tablet 3   Continuous Glucose Sensor (FREESTYLE LIBRE 3 PLUS SENSOR) MISC Change sensor every 15 days. 6 each 3   cyanocobalamin  (VITAMIN B12) 1000 MCG tablet Take  1 tablet (1,000 mcg total) by mouth daily. 90 tablet 3   insulin  aspart (NOVOLOG ) 100 UNIT/ML injection Inject 5 Units into the skin 3 (three) times daily before meals. (Patient taking differently: Inject 6 Units into the skin 3 (three) times daily before meals.)     insulin  degludec (TRESIBA FLEXTOUCH) 100 UNIT/ML FlexTouch Pen Inject 20 Units into the skin daily. 20 units once daily - REPLACES Levemir      Iron , Ferrous Sulfate , 325 (65 Fe) MG TABS Take 1 tablet by mouth daily. 90 tablet 3   losartan -hydrochlorothiazide  (HYZAAR) 100-25 MG tablet Take 1 tablet by mouth daily. 90 tablet 3   megestrol  (MEGACE ) 40 MG tablet Take 1 tablet (40 mg total) by mouth daily. 30 tablet 2   metoprolol  succinate (TOPROL -XL) 50 MG 24 hr tablet Take 1 tablet (50 mg total) by mouth daily. 90 tablet 0   montelukast  (SINGULAIR ) 10 MG tablet Take 10 mg by mouth at bedtime.     ondansetron  (ZOFRAN ) 8 MG tablet Take 1 tablet (8 mg total) by mouth every 8 (eight) hours as needed for nausea or vomiting. 30 tablet 0   No current facility-administered medications for this visit.    Review of  Systems General:  fatigue Skin: no complaints Eyes: no complaints HEENT: decreased hearing Breasts: no complaints Pulmonary: no complaints Cardiac: no complaints Gastrointestinal: decreased appetite and constipation; no complaints Ob/Gyn: ongoing vaginal spotting/bleeding Musculoskeletal: falls Hematology: no complaints Neurologic/Psych: depression, Peripheral neuropathy  Objective:  Physical Examination:  BP 115/82   Pulse 60   Temp 97.9 F (36.6 C)   Resp 16   Wt 154 lb (69.9 kg)   BMI 28.17 kg/m     ECOG Performance Status: 2  GENERAL: Patient is a elderly appearing female in no acute distress. In wheelchair.  HEENT:  Sclera clear. Anicteric NODES:  Negative axillary, supraclavicular, inguinal lymph node survery LUNGS:  Normal respiratory rate ABDOMEN:  Soft, nontender.  No hernias, incisions well healed. No masses or ascites EXTREMITIES:  No peripheral edema. Atraumatic. No cyanosis SKIN:  no rash or suspicious lesions identified.  NEURO:  Nonfocal. Well oriented.  Appropriate affect.  Pelvic exam: Chaperoned by NP EGBUS: no lesions Cervix/Vagina: narrow with some watery blood tinged fluid.  Some irregular tissue at left apex.    BME: normal    Lab Review No labs on site today  Radiology Review: I have independently reviewed imaging results as below and agree with findings and described in assessment & plan.   01/31/24- PET restaging FINDINGS:  NECK:  - No abnormal hypermetabolism.   CHEST: - No abnormal hypermetabolism. Incidental CT findings: - Right IJ Port-A-Cath terminates in the right atrium. Atherosclerotic calcification of the aorta, aortic valve and coronary arteries. Heart is enlarged. No pericardial or pleural effusion.   ABDOMEN/PELVIS: - Endometrial hypermetabolism has enlarged in scope and intensity, now 13.1, previously 11.8. Measurement on CT is challenging without IV contrast. No additional abnormal hypermetabolism. Incidental CT  findings: - Slight thickening of the right adrenal gland, unchanged. No specific follow-up necessary. Small hiatal hernia.   SKELETON: - Hypermetabolism associated with healing right L1, L2 L3 and L4 transverse process fractures. Hypermetabolism associated with a subacute appearing fracture of the posteromedial eighth rib. Otherwise, no abnormal hypermetabolism. Incidental CT findings: - Degenerative changes in the spine.   IMPRESSION: 1. Endometrial hypermetabolism has increased slightly in scope and intensity, indicative of disease progression. No evidence of metastatic disease. 2. Healing posteromedial right eighth rib and right L1-L4 transverse process  fractures. 3. Aortic atherosclerosis (ICD10-I70.0). Coronary artery calcification.     Electronically Signed   By: Newell Eke M.D.   On: 02/03/2024 11:07     Assessment:  Terri [name] is a 73 y.o. 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  PET scan shows involvement of entire uterus and cervix.  No distant disease. Bilateral common iliac nodes are borderline enlarged, but not PET positive.   Poorly differentiated cancer with TP53 overexpression and negative HPV test so unlikely to be cervical primary. She was hesitant for chemotherapy and elected for radiation in view of her locally advanced disease.  Completed 50 Gy pelvic radiation 04/18/22 and on exam still had fleshy cancer involving upper vagina and flush cervix.  CT scan continued to show hypoattenuation of uterus cw persistent disease, but no evidence of distant disease.  Discussed that she was not a surgical candidate due to extensive upper vaginal involvement with cancer and decision made to start systemic therapy.  She received three cycles of carboplatin /taxol /keytruda  and PET/CT 4/24 showed considerable decrease SUV in the uterus and no distant disease.  On exam 08/15/22 the  vaginal disease had regressed dramatically. Completed 6 cycles of carboplain/taxol /keytruda  10/04/22.  CT scan showed no evidence of residual disease. Continued on maintenance Keytruda .    Some vaginal spotting 9/24, but no obvious evidence of disease on exam.  PET scan showed mild activity in lower uterine segment and two high common iliac nodes 7-8 mm. PAP AGUS concerning for cancer. Doxil  x 3 cycles with progressive disease. Weekly taxol  with 3 cycles followed by PET showed decreased metabolic activity within the endometrium and resolution RIGHT common iliac lymph node metabolic activity. Weekly taxol  was continued. She has now completed 6 cycles of weekly taxol . PET scan on 01/31/24 was independently reviewed and I agree with persistent hypermetabolic activity of the uterine fundus. She has ongoing vaginal bleeding and irregular tissue at the apex that is suspicious for persistent diease. A piece of that tissue fell off during exam today and I submitted it to pathology. Bleeding likely coming from endometrium.   Cancer is TP53 mutated, MSS and HER2 negative. ER positive  Medical co-morbidities complicating care: obesity, COPD, aortic stenosis, PVD.  Plan:   Problem List Items Addressed This Visit       Genitourinary   Endometrial cancer [facility]) - Primary   Relevant Orders   Surgical pathology    Await results of vaginal tissue and then will discuss further treatment options with Dr Jacobo.  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 I suspect that her neuropathy will limit further treatment. She has become more frail, suffered additional falls. Lives alone and uses [facility] transport.   We previously requested additional testing of her ER/PR positivity however, not resulted. I'll reach out to Rayfield Jasmine to see if she can locate results at outside hospital. Dependent on her hormone status, she may be a candidate for alternating megace  and tamoxifen in the future.   Follow up based on results of  biopsy specimen and Dr Mancil will discuss options with Dr Jacobo.   Tinnie Dawn, DNP, AGNP-C, AOCNP [facility] [phone] (clinic)  I personally had a face to face interaction and evaluated the patient jointly with the NP, Ms. Tinnie Dawn.  I have reviewed her history and available records and have performed the key portions of the physical exam including lymph node survey, abdominal exam, pelvic exam  with my findings confirming those documented above by the APP.  I have discussed the case with the APP and the patient.  I agree with the above documentation, assessment and plan which was fully formulated by me.  Counseling was completed by me.   I personally saw the patient and performed a substantive portion of this encounter in conjunction with the listed APP as documented above.  Prentice Agent, MD  CC:  Hope Merle, MD [address] [place],  [place] [address] [phone]

## 2024-02-05 NOTE — Progress Notes (Unsigned)
 [facility]  [phone] [phone]  ID: [name] OB: 1950/10/08  MR#: [id number]  RDW#:[id number]  Patient Care Team: Gretel App, NP as PCP - General (Nurse Practitioner) Maurie Rayfield BIRCH, RN as Oncology Nurse Navigator Jacobo, Evalene PARAS, MD as Consulting Physician (Oncology) Kurtis Verdel Adler, MD (Inactive) as Referring Physician (Ophthalmology) [facility] Isadora Hose, MD as Consulting Physician (Pulmonary Disease) Geronimo Manuelita SAUNDERS, [facility] as Pharmacist (Pharmacist)  CHIEF COMPLAINT: 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  INTERVAL HISTORY: Patient returns to clinic today for further evaluation and discussion of her PET scan results.  She has worsening peripheral neuropathy making it difficult to walk, but otherwise feels well.  She continues to have chronic weakness and fatigue.  She has no other neurologic complaints.  She does not complain of vaginal bleeding today.  She denies any recent fevers or illnesses.  She has no chest pain, shortness of breath, cough, or hemoptysis.  She denies any nausea, vomiting, constipation, or diarrhea.  She has no urinary complaints.  Patient offers no further specific complaints today.  REVIEW OF SYSTEMS:   Review of Systems  Constitutional:  Positive for malaise/fatigue. Negative for fever and weight loss.  Respiratory: Negative.  Negative for cough, hemoptysis and shortness of breath.   Cardiovascular: Negative.  Negative for chest pain and leg swelling.  Gastrointestinal: Negative.  Negative for abdominal pain, blood in stool and melena.  Genitourinary: Negative.  Negative for dysuria.  Musculoskeletal: Negative.  Negative for back pain and falls.  Skin: Negative.  Negative for rash.  Neurological:  Positive for sensory change and weakness. Negative for dizziness, focal weakness and headaches.  Psychiatric/Behavioral: Negative.  The patient is not  nervous/anxious.     As per HPI. Otherwise, a complete review of systems is negative.  PAST MEDICAL HISTORY: Past Medical History:  Diagnosis Date   Adenomatous colon polyp    Aortic stenosis    Arthritis    Asthma    Cancer (HCC)    Claudication    COPD (chronic obstructive pulmonary disease) (HCC)    Depression    Diabetes mellitus without complication (HCC)    Esophageal dysphagia    GERD (gastroesophageal reflux disease)    Hyperlipemia    Hypertension    Obesity    Severe obesity (BMI 35.0-35.9 with comorbidity) (HCC)     PAST SURGICAL HISTORY: Past Surgical History:  Procedure Laterality Date   BLADDER SURGERY     COLONOSCOPY WITH PROPOFOL  N/A 08/05/2015   Procedure: COLONOSCOPY WITH PROPOFOL ;  Surgeon: Donnice Vaughn Manes, MD;  Location[facility] ENDOSCOPY;  Service: Endoscopy;  Laterality: N/A;   ESOPHAGOGASTRODUODENOSCOPY N/A 01/09/2022   Procedure: ESOPHAGOGASTRODUODENOSCOPY (EGD);  Surgeon: Maryruth Ole DASEN, MD;  Location: [facility] ENDOSCOPY;  Service: Endoscopy;  Laterality: N/A;   FOOT SURGERY Left    FRACTURE SURGERY     IR CV LINE INJECTION  06/22/2022   IR IMAGING GUIDED PORT INSERTION  06/05/2022   IR PORT REPAIR CENTRAL VENOUS ACCESS DEVICE  07/02/2022   LOWER EXTREMITY ANGIOGRAPHY Left 02/04/2023   Procedure: Lower Extremity Angiography;  Surgeon: Marea Selinda RAMAN, MD;  Location: [facility] INVASIVE CV LAB;  Service: Cardiovascular;  Laterality: Left;   ORIF ANKLE FRACTURE Left 03/26/2017   Procedure: OPEN REDUCTION INTERNAL FIXATION (ORIF) ANKLE FRACTURE;  Surgeon: Edie Norleen PARAS, MD;  Location: [facility] ORS;  Service: Orthopedics;  Laterality: Left;    FAMILY HISTORY: Family History  Problem Relation Age of Onset   CVA Mother  Diabetes Mother    CAD Father    Breast cancer Neg Hx     ADVANCED DIRECTIVES (Y/N):  N  HEALTH MAINTENANCE: Social History   Tobacco Use   Smoking status: Former    Current packs/day: 1.00    Average packs/day: 1 pack/day for 40.0 years  (40.0 ttl pk-yrs)    Types: Cigarettes   Smokeless tobacco: Never   Tobacco comments:    Quite 2015  Vaping Use   Vaping status: Never Used  Substance Use Topics   Alcohol use: No   Drug use: No     Colonoscopy:  PAP:  Bone density:  Lipid panel:  No Known Allergies  Current Outpatient Medications  Medication Sig Dispense Refill   albuterol  (VENTOLIN  HFA) 108 (90 Base) MCG/ACT inhaler Inhale 2 puffs into the lungs every 6 (six) hours as needed for wheezing or shortness of breath. 1 each 1   amLODipine  (NORVASC ) 10 MG tablet Take 1 tablet (10 mg total) by mouth daily. 90 tablet 3   atorvastatin  (LIPITOR) 80 MG tablet Take 1 tablet (80 mg total) by mouth daily. 90 tablet 3   budesonide -glycopyrrolate-formoterol  (BREZTRI) 160-9-4.8 MCG/ACT AERO inhaler Inhale 2 puffs into the lungs 2 (two) times daily. Via PAP AZ&Me (Patient taking differently: Inhale 1 puff into the lungs 2 (two) times daily. Via PAP AZ&Me)     buPROPion  (WELLBUTRIN  XL) 300 MG 24 hr tablet Take 1 tablet (300 mg total) by mouth daily. 90 tablet 0   Cholecalciferol  (VITAMIN D3) 50 MCG (2000 UT) capsule Take 2,000 Units by mouth daily.     citalopram  (CELEXA ) 20 MG tablet Take 1 tablet (20 mg total) by mouth daily. 90 tablet 3   cloNIDine  (CATAPRES ) 0.2 MG tablet Take 1 tablet (0.2 mg total) by mouth 2 (two) times daily. 180 tablet 3   clopidogrel  (PLAVIX ) 75 MG tablet Take 1 tablet (75 mg total) by mouth daily. 90 tablet 3   Continuous Glucose Sensor (FREESTYLE LIBRE 3 PLUS SENSOR) MISC Change sensor every 15 days. 6 each 3   cyanocobalamin  (VITAMIN B12) 1000 MCG tablet Take 1 tablet (1,000 mcg total) by mouth daily. 90 tablet 3   insulin  aspart (NOVOLOG ) 100 UNIT/ML injection Inject 5 Units into the skin 3 (three) times daily before meals. (Patient taking differently: Inject 6 Units into the skin 3 (three) times daily before meals.)     insulin  degludec (TRESIBA FLEXTOUCH) 100 UNIT/ML FlexTouch Pen Inject 20 Units into  the skin daily. 20 units once daily - REPLACES Levemir      Iron , Ferrous Sulfate , 325 (65 Fe) MG TABS Take 1 tablet by mouth daily. 90 tablet 3   losartan -hydrochlorothiazide  (HYZAAR) 100-25 MG tablet Take 1 tablet by mouth daily. 90 tablet 3   megestrol  (MEGACE ) 40 MG tablet Take 1 tablet (40 mg total) by mouth daily. 30 tablet 2   metoprolol  succinate (TOPROL -XL) 50 MG 24 hr tablet Take 1 tablet (50 mg total) by mouth daily. 90 tablet 0   montelukast  (SINGULAIR ) 10 MG tablet Take 10 mg by mouth at bedtime.     ondansetron  (ZOFRAN ) 8 MG tablet Take 1 tablet (8 mg total) by mouth every 8 (eight) hours as needed for nausea or vomiting. 30 tablet 0   No current facility-administered medications for this visit.    OBJECTIVE: Vitals:   02/05/24 1416  BP: 115/82  Pulse: 60  Resp: 16  Temp: 97.9 F (36.6 C)  SpO2: 97%       Body mass  index is 28.17 kg/m.    ECOG FS:2 - Symptomatic, <50% confined to bed  General: Well-developed, well-nourished, no acute distress.  Sitting in a wheelchair. Eyes: Pink conjunctiva, anicteric sclera. HEENT: Normocephalic, moist mucous membranes. Lungs: No audible wheezing or coughing. Heart: Regular rate and rhythm. Abdomen: Soft, nontender, no obvious distention. Musculoskeletal: No edema, cyanosis, or clubbing. Neuro: Alert, answering all questions appropriately. Cranial nerves grossly intact. Skin: No rashes or petechiae noted. Psych: Normal affect.  LAB RESULTS:  Lab Results  Component Value Date   NA 135 02/05/2024   K 3.3 (L) 02/05/2024   CL 98 02/05/2024   CO2 28 02/05/2024   GLUCOSE 246 (H) 02/05/2024   BUN 34 (H) 02/05/2024   CREATININE 1.22 (H) 02/05/2024   CALCIUM  8.4 (L) 02/05/2024   PROT 5.4 (L) 02/05/2024   ALBUMIN 3.2 (L) 02/05/2024   AST 17 02/05/2024   ALT 12 02/05/2024   ALKPHOS 68 02/05/2024   BILITOT 0.4 02/05/2024   GFRNONAA 47 (L) 02/05/2024   GFRAA >60 03/27/2017    Lab Results  Component Value Date   WBC 4.8  02/05/2024   NEUTROABS 3.6 02/05/2024   HGB 10.0 (L) 02/05/2024   HCT 30.3 (L) 02/05/2024   MCV 86.6 02/05/2024   PLT 162 02/05/2024     STUDIES: NM PET Image Restag (PS) Skull Base To Thigh Result Date: 02/03/2024 CLINICAL DATA:  Subsequent treatment strategy for endometrial cancer. EXAM: NUCLEAR MEDICINE PET SKULL BASE TO THIGH TECHNIQUE: 7.9 mCi F-18 FDG was injected intravenously. Full-ring PET imaging was performed from the skull base to thigh after the radiotracer. CT data was obtained and used for attenuation correction and anatomic localization. Fasting blood glucose: 98 mg/dl COMPARISON:  93/82/7974. FINDINGS: Mediastinal blood pool activity: SUV max 2.1 Liver activity: SUV max NA NECK: No abnormal hypermetabolism. Incidental CT findings: None. CHEST: No abnormal hypermetabolism. Incidental CT findings: Right IJ Port-A-Cath terminates in the right atrium. Atherosclerotic calcification of the aorta, aortic valve and coronary arteries. Heart is enlarged. No pericardial or pleural effusion. ABDOMEN/PELVIS: Endometrial hypermetabolism has enlarged in scope and intensity, now 13.1, previously 11.8. Measurement on CT is challenging without IV contrast. No additional abnormal hypermetabolism. Incidental CT findings: Slight thickening of the right adrenal gland, unchanged. No specific follow-up necessary. Small hiatal hernia. SKELETON: Hypermetabolism associated with healing right L1, L2 L3 and L4 transverse process fractures. Hypermetabolism associated with a subacute appearing fracture of the posteromedial eighth rib. Otherwise, no abnormal hypermetabolism. Incidental CT findings: Degenerative changes in the spine. IMPRESSION: 1. Endometrial hypermetabolism has increased slightly in scope and intensity, indicative of disease progression. No evidence of metastatic disease. 2. Healing posteromedial right eighth rib and right L1-L4 transverse process fractures. 3. Aortic atherosclerosis (ICD10-I70.0).  Coronary artery calcification. Electronically Signed   By: Newell Eke M.D.   On: 02/03/2024 11:07    ONCOLOGY HISTORY: Patient was initially hesitant to undergo chemotherapy and elected to do XRT only which was completed on April 18, 2022.  CT scan results from October 08, 2022 reviewed independently with no obvious evidence of progressive disease. She completed 6 cycles of carboplatinum, Taxol , and Keytruda  on October 03, 2022.  Patient then initiated maintenance Keytruda  on October 23, 2022.  PET scan results from January 15, 2023 reviewed independently with mild nonspecific residual hypermetabolism in the lower uterine segment as well as small bilateral common iliac nodes possibly suggesting nodal metastasis.  Patient was seen by gynecology oncology who determined surgical intervention is not an option.  They recommend discontinuing  Keytruda  for progressive disease and initiating single agent Doxil  every 28 days.  Patient received 3 cycles of Doxil  before progression of disease her last dose was given on June 05, 2023.  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  Repeat PET scan on October 15, 2023 reviewed independently with significant improvement of disease burden.  Internal exam by gynecology oncology at that time also confirmed disease improvement.  Patient subsequently underwent 3 additional cycles of single agent Taxol  completing on January 01, 2024.  Repeat PET scan on January 31, 2024 reviewed independently and report as above with stable/possibly slight progression of disease.  No further chemotherapy is planned at this time.  Patient was seen once again by gynecology oncology today who plan to make treatment recommendations after biopsy has been resulted.  Follow-up will be based on these recommendations.  Port: Port revision successful.   Continue with port flushes every 6 weeks. Hyperglycemia: Chronic and unchanged.  Patient continues to have poor blood glucose control. Renal insufficiency: Mild.  Patient's creatinine is 1.22 today. Leukopenia: Resolved. Anemia: Hemoglobin has trended up slightly to 10.0. Vaginal bleeding: Improved.  Continue follow-up with gynecology oncology as above.  Claudication symptoms: Resolved.  Patient underwent revascularization procedure on February 04, 2023.  Patient reports she recently missed her appointment with vascular surgery and needs to reschedule. Right hamstring tendon rupture: Patient reports there is no plan for surgical repair. Rash: Resolved.  Likely secondary to Doxil  which has been discontinued.   Poor appetite/weight loss: Unclear if patient is taking Megace .  He previously refused referral to dietary.   Peripheral neuropathy: Worse.  Continue follow-up with neurology as recommended.   Falls: Intermittent.  Patient now has 2 walkers at her home upstairs and downstairs.  She admits to only using them occasionally.  Patient expressed understanding and was in agreement with this plan. She also understands that She can call clinic at any time with any questions, concerns, or complaints.    Cancer Staging  Endometrial cancer [facility]) Staging form: Corpus Uteri - Carcinoma and Carcinosarcoma, AJCC 8th Edition - Clinical stage from 05/30/2022: FIGO Stage IIIB (cT3b, cN0, cM0) - Signed by Jacobo Evalene PARAS, MD on 05/30/2022 Stage prefix: Initial diagnosis   Evalene PARAS Jacobo, MD   02/05/2024 2:30 PM

## 2024-02-06 ENCOUNTER — Encounter: Payer: Self-pay | Admitting: Oncology

## 2024-02-06 ENCOUNTER — Other Ambulatory Visit: Payer: Self-pay

## 2024-02-07 ENCOUNTER — Ambulatory Visit (INDEPENDENT_AMBULATORY_CARE_PROVIDER_SITE_OTHER)

## 2024-02-07 ENCOUNTER — Encounter: Payer: Self-pay | Admitting: Oncology

## 2024-02-07 ENCOUNTER — Ambulatory Visit (INDEPENDENT_AMBULATORY_CARE_PROVIDER_SITE_OTHER): Admitting: Vascular Surgery

## 2024-02-07 ENCOUNTER — Encounter (INDEPENDENT_AMBULATORY_CARE_PROVIDER_SITE_OTHER): Payer: Self-pay | Admitting: Vascular Surgery

## 2024-02-07 VITALS — BP 136/64 | HR 70 | Resp 16 | Ht 62.0 in | Wt 151.6 lb

## 2024-02-07 DIAGNOSIS — E1159 Type 2 diabetes mellitus with other circulatory complications: Secondary | ICD-10-CM | POA: Diagnosis not present

## 2024-02-07 DIAGNOSIS — I70213 Atherosclerosis of native arteries of extremities with intermittent claudication, bilateral legs: Secondary | ICD-10-CM | POA: Diagnosis not present

## 2024-02-07 DIAGNOSIS — E118 Type 2 diabetes mellitus with unspecified complications: Secondary | ICD-10-CM

## 2024-02-07 DIAGNOSIS — I152 Hypertension secondary to endocrine disorders: Secondary | ICD-10-CM

## 2024-02-07 DIAGNOSIS — E785 Hyperlipidemia, unspecified: Secondary | ICD-10-CM

## 2024-02-07 DIAGNOSIS — E1169 Type 2 diabetes mellitus with other specified complication: Secondary | ICD-10-CM

## 2024-02-07 LAB — SURGICAL PATHOLOGY

## 2024-02-07 NOTE — Assessment & Plan Note (Signed)
 ABIs today are noncompressible but she has triphasic waveforms and normal digital waveforms bilaterally.  Continue Plavix  and Lipitor.  Recheck in 6 months.

## 2024-02-07 NOTE — Progress Notes (Signed)
 MRN : [id number]  Terri [name] is a 73 y.o. (1950-08-19) female who presents with chief complaint of  Chief Complaint  Patient presents with   Follow-up    6 month follow up ABI   .  History of Present Illness: Patient returns today in follow up of her peripheral arterial disease.  She underwent extensive revascularization with aortoiliac disease and left SFA disease almost a year ago.  She has done well.  She does still have neuropathic symptoms in her legs but this is unchanged.  She denies any wounds, ulceration, or ischemic rest pain symptoms.  ABIs today are noncompressible but she has triphasic waveforms and normal digital waveforms bilaterally.  Current Outpatient Medications  Medication Sig Dispense Refill   albuterol  (VENTOLIN  HFA) 108 (90 Base) MCG/ACT inhaler Inhale 2 puffs into the lungs every 6 (six) hours as needed for wheezing or shortness of breath. 1 each 1   amLODipine  (NORVASC ) 10 MG tablet Take 1 tablet (10 mg total) by mouth daily. 90 tablet 3   atorvastatin  (LIPITOR) 80 MG tablet Take 1 tablet (80 mg total) by mouth daily. 90 tablet 3   budesonide -glycopyrrolate-formoterol  (BREZTRI) 160-9-4.8 MCG/ACT AERO inhaler Inhale 2 puffs into the lungs 2 (two) times daily. Via PAP AZ&Me (Patient taking differently: Inhale 1 puff into the lungs 2 (two) times daily. Via PAP AZ&Me)     buPROPion  (WELLBUTRIN  XL) 300 MG 24 hr tablet Take 1 tablet (300 mg total) by mouth daily. 90 tablet 0   Cholecalciferol  (VITAMIN D3) 50 MCG (2000 UT) capsule Take 2,000 Units by mouth daily.     citalopram  (CELEXA ) 20 MG tablet Take 1 tablet (20 mg total) by mouth daily. 90 tablet 3   cloNIDine  (CATAPRES ) 0.2 MG tablet Take 1 tablet (0.2 mg total) by mouth 2 (two) times daily. 180 tablet 3   clopidogrel  (PLAVIX ) 75 MG tablet Take 1 tablet (75 mg total) by mouth daily. 90 tablet 3   Continuous Glucose Sensor (FREESTYLE LIBRE 3 PLUS SENSOR) MISC Change sensor every 15 days. 6 each 3   cyanocobalamin   (VITAMIN B12) 1000 MCG tablet Take 1 tablet (1,000 mcg total) by mouth daily. 90 tablet 3   insulin  aspart (NOVOLOG ) 100 UNIT/ML injection Inject 5 Units into the skin 3 (three) times daily before meals. (Patient taking differently: Inject 6 Units into the skin 3 (three) times daily before meals.)     insulin  degludec (TRESIBA FLEXTOUCH) 100 UNIT/ML FlexTouch Pen Inject 20 Units into the skin daily. 20 units once daily - REPLACES Levemir      Iron , Ferrous Sulfate , 325 (65 Fe) MG TABS Take 1 tablet by mouth daily. 90 tablet 3   losartan -hydrochlorothiazide  (HYZAAR) 100-25 MG tablet Take 1 tablet by mouth daily. 90 tablet 3   megestrol  (MEGACE ) 40 MG tablet Take 1 tablet (40 mg total) by mouth daily. 30 tablet 2   metoprolol  succinate (TOPROL -XL) 50 MG 24 hr tablet Take 1 tablet (50 mg total) by mouth daily. 90 tablet 0   montelukast  (SINGULAIR ) 10 MG tablet Take 10 mg by mouth at bedtime.     ondansetron  (ZOFRAN ) 8 MG tablet Take 1 tablet (8 mg total) by mouth every 8 (eight) hours as needed for nausea or vomiting. 30 tablet 0   No current facility-administered medications for this visit.    Past Medical History:  Diagnosis Date   Adenomatous colon polyp    Aortic stenosis    Arthritis    Asthma    Cancer (HCC)  Claudication    COPD (chronic obstructive pulmonary disease) (HCC)    Depression    Diabetes mellitus without complication (HCC)    Esophageal dysphagia    GERD (gastroesophageal reflux disease)    Hyperlipemia    Hypertension    Obesity    Severe obesity (BMI 35.0-35.9 with comorbidity) (HCC)     Past Surgical History:  Procedure Laterality Date   BLADDER SURGERY     COLONOSCOPY WITH PROPOFOL  N/A 08/05/2015   Procedure: COLONOSCOPY WITH PROPOFOL ;  Surgeon: Donnice Vaughn Manes, MD;  Location: [facility] ENDOSCOPY;  Service: Endoscopy;  Laterality: N/A;   ESOPHAGOGASTRODUODENOSCOPY N/A 01/09/2022   Procedure: ESOPHAGOGASTRODUODENOSCOPY (EGD);  Surgeon: Maryruth Ole DASEN, MD;   Location: [facility] ENDOSCOPY;  Service: Endoscopy;  Laterality: N/A;   FOOT SURGERY Left    FRACTURE SURGERY     IR CV LINE INJECTION  06/22/2022   IR IMAGING GUIDED PORT INSERTION  06/05/2022   IR PORT REPAIR CENTRAL VENOUS ACCESS DEVICE  07/02/2022   LOWER EXTREMITY ANGIOGRAPHY Left 02/04/2023   Procedure: Lower Extremity Angiography;  Surgeon: Marea Selinda RAMAN, MD;  Location: [facility] INVASIVE CV LAB;  Service: Cardiovascular;  Laterality: Left;   ORIF ANKLE FRACTURE Left 03/26/2017   Procedure: OPEN REDUCTION INTERNAL FIXATION (ORIF) ANKLE FRACTURE;  Surgeon: Edie Norleen PARAS, MD;  Location: [facility] ORS;  Service: Orthopedics;  Laterality: Left;     Social History   Tobacco Use   Smoking status: Former    Current packs/day: 1.00    Average packs/day: 1 pack/day for 40.0 years (40.0 ttl pk-yrs)    Types: Cigarettes   Smokeless tobacco: Never   Tobacco comments:    Quite 2015  Vaping Use   Vaping status: Never Used  Substance Use Topics   Alcohol use: No   Drug use: No      Family History  Problem Relation Age of Onset   CVA Mother    Diabetes Mother    CAD Father    Breast cancer Neg Hx      No Known Allergies   REVIEW OF SYSTEMS (Negative unless checked)   Constitutional: [] Weight loss  [] Fever  [] Chills Cardiac: [] Chest pain   [] Chest pressure   [] Palpitations   [] Shortness of breath when laying flat   [] Shortness of breath at rest   [x] Shortness of breath with exertion. Vascular:  [x] Pain in legs with walking   [] Pain in legs at rest   [] Pain in legs when laying flat   [x] Claudication   [] Pain in feet when walking  [] Pain in feet at rest  [] Pain in feet when laying flat   [] History of DVT   [] Phlebitis   [] Swelling in legs   [] Varicose veins   [] Non-healing ulcers Pulmonary:   [] Uses home oxygen   [] Productive cough   [] Hemoptysis   [] Wheeze  [x] COPD   [] Asthma Neurologic:  [] Dizziness  [] Blackouts   [] Seizures   [] History of stroke   [] History of TIA  [] Aphasia   [] Temporary  blindness   [] Dysphagia   [] Weakness or numbness in arms   [x] Weakness or numbness in legs Musculoskeletal:  [x] Arthritis   [] Joint swelling   [] Joint pain   [] Low back pain Hematologic:  [] Easy bruising  [] Easy bleeding   [] Hypercoagulable state   [] Anemic   Gastrointestinal:  [] Blood in stool   [] Vomiting blood  [x] Gastroesophageal reflux/heartburn   [] Abdominal pain Genitourinary:  [] Chronic kidney disease   [] Difficult urination  [] Frequent urination  [] Burning with urination   [] Hematuria Skin:  [] Rashes   []   Ulcers   [] Wounds Psychological:  [x] History of anxiety   []  History of major depression.  Physical Examination  BP 136/64 (BP Location: Right Arm, Patient Position: Sitting, Cuff Size: Normal)   Pulse 70   Resp 16   Ht 5' 2 (1.575 m)   Wt 151 lb 9.6 oz (68.8 kg)   BMI 27.73 kg/m  Gen:  WD/WN, NAD. Appears older than stated age. Head: [place]/AT, No temporalis wasting. Ear/Nose/Throat: Hearing grossly intact, nares w/o erythema or drainage Eyes: Conjunctiva clear. Sclera non-icteric Neck: Supple.  Trachea midline Pulmonary:  Good air movement, no use of accessory muscles.  Cardiac: RRR, no JVD Vascular:  Vessel Right Left  Radial Palpable Palpable                          PT Palpable Palpable  DP Palpable Palpable   Gastrointestinal: soft, non-tender/non-distended. No guarding/reflex.  Musculoskeletal: M/S 5/5 throughout.  No deformity or atrophy. No edema. Using a walker Neurologic: Sensation grossly intact in extremities.  Symmetrical.  Speech is fluent.  Psychiatric: Judgment intact, Mood & affect appropriate for pt's clinical situation. Dermatologic: No rashes or ulcers noted.  No cellulitis or open wounds.      Labs Recent Results (from the past 2160 hours)  CMP (Cancer Center only)     Status: Abnormal   Collection Time: 11/13/23 10:14 AM  Result Value Ref Range   Sodium 137 135 - 145 mmol/L   Potassium 3.2 (L) 3.5 - 5.1 mmol/L   Chloride 99 98 - 111  mmol/L   CO2 29 22 - 32 mmol/L   Glucose, Bld 227 (H) 70 - 99 mg/dL    Comment: Glucose reference range applies only to samples taken after fasting for at least 8 hours.   BUN 23 8 - 23 mg/dL   Creatinine 8.81 (H) 9.55 - 1.00 mg/dL   Calcium  8.0 (L) 8.9 - 10.3 mg/dL   Total Protein 5.3 (L) 6.5 - 8.1 g/dL   Albumin 2.8 (L) 3.5 - 5.0 g/dL   AST 15 15 - 41 U/L   ALT 11 0 - 44 U/L   Alkaline Phosphatase 68 38 - 126 U/L   Total Bilirubin 0.9 0.0 - 1.2 mg/dL   GFR, Estimated 49 (L) >60 mL/min    Comment: (NOTE) Calculated using the CKD-EPI Creatinine Equation (2021)    Anion gap 9 5 - 15    Comment: Performed at [facility], [address] [place], [place] [address]  CBC with Differential (Cancer Center Only)     Status: Abnormal   Collection Time: 11/13/23 10:14 AM  Result Value Ref Range   WBC Count 3.2 (L) 4.0 - 10.5 K/uL   RBC 2.74 (L) 3.87 - 5.11 MIL/uL   Hemoglobin 7.3 (L) 12.0 - 15.0 g/dL   HCT 76.1 (L) 63.9 - 53.9 %   MCV 86.9 80.0 - 100.0 fL   MCH 26.6 26.0 - 34.0 pg   MCHC 30.7 30.0 - 36.0 g/dL   RDW 83.4 (H) 88.4 - 84.4 %   Platelet Count 255 150 - 400 K/uL   nRBC 0.0 0.0 - 0.2 %   Neutrophils Relative % 74 %   Neutro Abs 2.3 1.7 - 7.7 K/uL   Lymphocytes Relative 7 %   Lymphs Abs 0.2 (L) 0.7 - 4.0 K/uL   Monocytes Relative 16 %   Monocytes Absolute 0.5 0.1 - 1.0 K/uL   Eosinophils Relative 1 %   Eosinophils  Absolute 0.0 0.0 - 0.5 K/uL   Basophils Relative 1 %   Basophils Absolute 0.0 0.0 - 0.1 K/uL   Immature Granulocytes 1 %   Abs Immature Granulocytes 0.04 0.00 - 0.07 K/uL    Comment: Performed at [facility], [address] [place], [place] [address]  Type and screen     Status: None   Collection Time: 11/13/23 10:14 AM  Result Value Ref Range   ABO/RH(D) A POS    Antibody Screen NEG    Sample Expiration 11/16/2023,2359    Unit Number [id number]    Blood Component Type RED CELLS,LR    Unit division 00    Status of Unit ISSUED,FINAL     Transfusion Status OK TO TRANSFUSE    Crossmatch Result      Compatible Performed at [facility], [address] [place], [place] [address]   BPAM RBC     Status: None   Collection Time: 11/13/23 10:14 AM  Result Value Ref Range   ISSUE DATE / TIME 797492828655    Blood Product Unit Number [id number]    PRODUCT CODE [id number]    Unit Type and Rh 6200    Blood Product Expiration Date 797491827640   Prepare RBC (crossmatch)     Status: None   Collection Time: 11/13/23  2:30 PM  Result Value Ref Range   Order Confirmation      ORDER PROCESSED BY BLOOD BANK Performed at [facility] [address] [place], [place] [address]   CBC with Differential (Cancer Center Only)     Status: Abnormal   Collection Time: 11/27/23  9:28 AM  Result Value Ref Range   WBC Count 4.9 4.0 - 10.5 K/uL   RBC 3.41 (L) 3.87 - 5.11 MIL/uL   Hemoglobin 9.0 (L) 12.0 - 15.0 g/dL   HCT 71.5 (L) 63.9 - 53.9 %   MCV 83.3 80.0 - 100.0 fL   MCH 26.4 26.0 - 34.0 pg   MCHC 31.7 30.0 - 36.0 g/dL   RDW 83.1 (H) 88.4 - 84.4 %   Platelet Count 250 150 - 400 K/uL   nRBC 0.0 0.0 - 0.2 %   Neutrophils Relative % 73 %   Neutro Abs 3.5 1.7 - 7.7 K/uL   Lymphocytes Relative 7 %   Lymphs Abs 0.4 (L) 0.7 - 4.0 K/uL   Monocytes Relative 13 %   Monocytes Absolute 0.6 0.1 - 1.0 K/uL   Eosinophils Relative 6 %   Eosinophils Absolute 0.3 0.0 - 0.5 K/uL   Basophils Relative 0 %   Basophils Absolute 0.0 0.0 - 0.1 K/uL   Immature Granulocytes 1 %   Abs Immature Granulocytes 0.03 0.00 - 0.07 K/uL    Comment: Performed at [facility], [address] [place], [place] [address]  CMP (Cancer Center only)     Status: Abnormal   Collection Time: 11/27/23  9:28 AM  Result Value Ref Range   Sodium 135 135 - 145 mmol/L   Potassium 3.8 3.5 - 5.1 mmol/L   Chloride 99 98 - 111 mmol/L   CO2 28 22 - 32 mmol/L   Glucose, Bld 277 (H) 70 - 99 mg/dL    Comment: Glucose reference range applies only to  samples taken after fasting for at least 8 hours.   BUN 40 (H) 8 - 23 mg/dL   Creatinine 8.66 (H) 9.55 - 1.00 mg/dL   Calcium  8.6 (L) 8.9 - 10.3 mg/dL   Total Protein 5.8 (  L) 6.5 - 8.1 g/dL   Albumin 2.9 (L) 3.5 - 5.0 g/dL   AST 14 (L) 15 - 41 U/L   ALT 10 0 - 44 U/L   Alkaline Phosphatase 81 38 - 126 U/L   Total Bilirubin 0.7 0.0 - 1.2 mg/dL   GFR, Estimated 42 (L) >60 mL/min    Comment: (NOTE) Calculated using the CKD-EPI Creatinine Equation (2021)    Anion gap 8 5 - 15    Comment: Performed at [facility], [address] [place], [place] [address]  CMP (Cancer Center only)     Status: Abnormal   Collection Time: 12/03/23 12:56 PM  Result Value Ref Range   Sodium 139 135 - 145 mmol/L   Potassium 4.0 3.5 - 5.1 mmol/L   Chloride 103 98 - 111 mmol/L   CO2 27 22 - 32 mmol/L   Glucose, Bld 271 (H) 70 - 99 mg/dL    Comment: Glucose reference range applies only to samples taken after fasting for at least 8 hours.   BUN 44 (H) 8 - 23 mg/dL   Creatinine 8.72 (H) 9.55 - 1.00 mg/dL   Calcium  8.4 (L) 8.9 - 10.3 mg/dL   Total Protein 5.3 (L) 6.5 - 8.1 g/dL   Albumin 2.8 (L) 3.5 - 5.0 g/dL   AST 17 15 - 41 U/L   ALT 11 0 - 44 U/L   Alkaline Phosphatase 70 38 - 126 U/L   Total Bilirubin 0.5 0.0 - 1.2 mg/dL   GFR, Estimated 45 (L) >60 mL/min    Comment: (NOTE) Calculated using the CKD-EPI Creatinine Equation (2021)    Anion gap 9 5 - 15    Comment: Performed at [facility], [address] [place], [place] [address]  CBC with Differential (Cancer Center Only)     Status: Abnormal   Collection Time: 12/03/23 12:56 PM  Result Value Ref Range   WBC Count 4.1 4.0 - 10.5 K/uL   RBC 3.00 (L) 3.87 - 5.11 MIL/uL   Hemoglobin 8.0 (L) 12.0 - 15.0 g/dL   HCT 74.9 (L) 63.9 - 53.9 %   MCV 83.3 80.0 - 100.0 fL   MCH 26.7 26.0 - 34.0 pg   MCHC 32.0 30.0 - 36.0 g/dL   RDW 83.2 (H) 88.4 - 84.4 %   Platelet Count 270 150 - 400 K/uL   nRBC 0.0 0.0 - 0.2 %   Neutrophils Relative %  81 %   Neutro Abs 3.4 1.7 - 7.7 K/uL   Lymphocytes Relative 9 %   Lymphs Abs 0.4 (L) 0.7 - 4.0 K/uL   Monocytes Relative 5 %   Monocytes Absolute 0.2 0.1 - 1.0 K/uL   Eosinophils Relative 3 %   Eosinophils Absolute 0.1 0.0 - 0.5 K/uL   Basophils Relative 1 %   Basophils Absolute 0.0 0.0 - 0.1 K/uL   Immature Granulocytes 1 %   Abs Immature Granulocytes 0.05 0.00 - 0.07 K/uL    Comment: Performed at [facility], [address] [place], [place] [address]  Type and screen     Status: None   Collection Time: 12/03/23  2:29 PM  Result Value Ref Range   ABO/RH(D) A POS    Antibody Screen NEG    Sample Expiration 12/06/2023,2359    Unit Number [id number]    Blood Component Type RED CELLS,LR    Unit division 00    Status of Unit ISSUED,FINAL    Transfusion Status OK TO TRANSFUSE  Crossmatch Result      Compatible Performed at [facility], [address] [place], [place] [address]   BPAM RBC     Status: None   Collection Time: 12/03/23  2:29 PM  Result Value Ref Range   ISSUE DATE / TIME 797491928749    Blood Product Unit Number [id number]    PRODUCT CODE [id number]    Unit Type and Rh 6200    Blood Product Expiration Date [id number]   Prepare RBC (crossmatch)     Status: None   Collection Time: 12/03/23  2:46 PM  Result Value Ref Range   Order Confirmation      ORDER PROCESSED BY BLOOD BANK Performed at [facility], [address] [place], [place] [address]   CMP (Cancer Center only)     Status: Abnormal   Collection Time: 12/17/23  8:53 AM  Result Value Ref Range   Sodium 137 135 - 145 mmol/L   Potassium 3.8 3.5 - 5.1 mmol/L   Chloride 104 98 - 111 mmol/L   CO2 26 22 - 32 mmol/L   Glucose, Bld 234 (H) 70 - 99 mg/dL    Comment: Glucose reference range applies only to samples taken after fasting for at least 8 hours.   BUN 41 (H) 8 - 23 mg/dL   Creatinine 8.65 (H) 9.55 - 1.00 mg/dL   Calcium  8.3 (L) 8.9 - 10.3 mg/dL   Total  Protein 5.2 (L) 6.5 - 8.1 g/dL   Albumin 2.9 (L) 3.5 - 5.0 g/dL   AST 17 15 - 41 U/L   ALT 11 0 - 44 U/L   Alkaline Phosphatase 64 38 - 126 U/L   Total Bilirubin 0.7 0.0 - 1.2 mg/dL   GFR, Estimated 42 (L) >60 mL/min    Comment: (NOTE) Calculated using the CKD-EPI Creatinine Equation (2021)    Anion gap 7 5 - 15    Comment: Performed at [facility], [address] [place], [place] [address]  CBC with Differential (Cancer Center Only)     Status: Abnormal   Collection Time: 12/17/23  8:53 AM  Result Value Ref Range   WBC Count 2.9 (L) 4.0 - 10.5 K/uL   RBC 3.11 (L) 3.87 - 5.11 MIL/uL   Hemoglobin 8.5 (L) 12.0 - 15.0 g/dL   HCT 73.1 (L) 63.9 - 53.9 %   MCV 86.2 80.0 - 100.0 fL   MCH 27.3 26.0 - 34.0 pg   MCHC 31.7 30.0 - 36.0 g/dL   RDW 82.0 (H) 88.4 - 84.4 %   Platelet Count 181 150 - 400 K/uL   nRBC 0.0 0.0 - 0.2 %   Neutrophils Relative % 69 %   Neutro Abs 2.0 1.7 - 7.7 K/uL   Lymphocytes Relative 11 %   Lymphs Abs 0.3 (L) 0.7 - 4.0 K/uL   Monocytes Relative 17 %   Monocytes Absolute 0.5 0.1 - 1.0 K/uL   Eosinophils Relative 2 %   Eosinophils Absolute 0.1 0.0 - 0.5 K/uL   Basophils Relative 1 %   Basophils Absolute 0.0 0.0 - 0.1 K/uL   Immature Granulocytes 0 %   Abs Immature Granulocytes 0.01 0.00 - 0.07 K/uL    Comment: Performed at [facility], [address] [place], [place] [address]  Hold Tube- Blood Bank     Status: None   Collection Time: 12/17/23  8:53 AM  Result Value Ref Range   Blood Bank Specimen SAMPLE AVAILABLE FOR TESTING  Sample Expiration      12/20/2023,2359 Performed at [facility], [address] [place], [place] [address]   CMP (Cancer Center only)     Status: Abnormal   Collection Time: 12/25/23  9:24 AM  Result Value Ref Range   Sodium 140 135 - 145 mmol/L   Potassium 3.8 3.5 - 5.1 mmol/L   Chloride 105 98 - 111 mmol/L   CO2 28 22 - 32 mmol/L   Glucose, Bld 248 (H) 70 - 99 mg/dL    Comment: Glucose  reference range applies only to samples taken after fasting for at least 8 hours.   BUN 20 8 - 23 mg/dL   Creatinine 9.05 9.55 - 1.00 mg/dL   Calcium  8.6 (L) 8.9 - 10.3 mg/dL   Total Protein 5.3 (L) 6.5 - 8.1 g/dL   Albumin 3.1 (L) 3.5 - 5.0 g/dL   AST 18 15 - 41 U/L   ALT 13 0 - 44 U/L   Alkaline Phosphatase 67 38 - 126 U/L   Total Bilirubin 0.8 0.0 - 1.2 mg/dL   GFR, Estimated >39 >39 mL/min    Comment: (NOTE) Calculated using the CKD-EPI Creatinine Equation (2021)    Anion gap 7 5 - 15    Comment: Performed at [facility], [address] [place], [place] [address]  CBC with Differential (Cancer Center Only)     Status: Abnormal   Collection Time: 12/25/23  9:24 AM  Result Value Ref Range   WBC Count 3.6 (L) 4.0 - 10.5 K/uL   RBC 3.09 (L) 3.87 - 5.11 MIL/uL   Hemoglobin 8.3 (L) 12.0 - 15.0 g/dL   HCT 73.2 (L) 63.9 - 53.9 %   MCV 86.4 80.0 - 100.0 fL   MCH 26.9 26.0 - 34.0 pg   MCHC 31.1 30.0 - 36.0 g/dL   RDW 82.3 (H) 88.4 - 84.4 %   Platelet Count 162 150 - 400 K/uL   nRBC 0.6 (H) 0.0 - 0.2 %   Neutrophils Relative % 83 %   Neutro Abs 2.9 1.7 - 7.7 K/uL   Lymphocytes Relative 7 %   Lymphs Abs 0.2 (L) 0.7 - 4.0 K/uL   Monocytes Relative 7 %   Monocytes Absolute 0.3 0.1 - 1.0 K/uL   Eosinophils Relative 2 %   Eosinophils Absolute 0.1 0.0 - 0.5 K/uL   Basophils Relative 0 %   Basophils Absolute 0.0 0.0 - 0.1 K/uL   Immature Granulocytes 1 %   Abs Immature Granulocytes 0.05 0.00 - 0.07 K/uL    Comment: Performed at [facility], [address] [place], [place] [address]  Hold Tube- Blood Bank     Status: None   Collection Time: 12/25/23  9:24 AM  Result Value Ref Range   Blood Bank Specimen SAMPLE AVAILABLE FOR TESTING    Sample Expiration      12/28/2023,2359 Performed at [facility], [address] [place], [place] [address]   Hold Tube- Blood Bank     Status: None   Collection Time: 01/01/24  8:55 AM  Result Value Ref Range   Blood  Bank Specimen SAMPLE AVAILABLE FOR TESTING    Sample Expiration      01/04/2024,2359 Performed at [facility], [address] [place], [place] [address]   CBC with Differential (Cancer Center Only)     Status: Abnormal   Collection Time: 01/01/24  8:57 AM  Result Value Ref Range   WBC Count 2.0 (L) 4.0 -  10.5 K/uL   RBC 3.29 (L) 3.87 - 5.11 MIL/uL   Hemoglobin 8.9 (L) 12.0 - 15.0 g/dL   HCT 71.6 (L) 63.9 - 53.9 %   MCV 86.0 80.0 - 100.0 fL   MCH 27.1 26.0 - 34.0 pg   MCHC 31.4 30.0 - 36.0 g/dL   RDW 82.2 (H) 88.4 - 84.4 %   Platelet Count 171 150 - 400 K/uL   nRBC 0.0 0.0 - 0.2 %   Neutrophils Relative % 72 %   Neutro Abs 1.5 (L) 1.7 - 7.7 K/uL   Lymphocytes Relative 15 %   Lymphs Abs 0.3 (L) 0.7 - 4.0 K/uL   Monocytes Relative 8 %   Monocytes Absolute 0.2 0.1 - 1.0 K/uL   Eosinophils Relative 3 %   Eosinophils Absolute 0.1 0.0 - 0.5 K/uL   Basophils Relative 1 %   Basophils Absolute 0.0 0.0 - 0.1 K/uL   Immature Granulocytes 1 %   Abs Immature Granulocytes 0.02 0.00 - 0.07 K/uL    Comment: Performed at [facility], [address] [place] [address] [place], [place] [address]  CMP (Cancer Center only)     Status: Abnormal   Collection Time: 01/01/24  8:57 AM  Result Value Ref Range   Sodium 139 135 - 145 mmol/L   Potassium 3.4 (L) 3.5 - 5.1 mmol/L   Chloride 102 98 - 111 mmol/L   CO2 30 22 - 32 mmol/L   Glucose, Bld 179 (H) 70 - 99 mg/dL    Comment: Glucose reference range applies only to samples taken after fasting for at least 8 hours.   BUN 31 (H) 8 - 23 mg/dL   Creatinine 8.88 (H) 9.55 - 1.00 mg/dL   Calcium  8.6 (L) 8.9 - 10.3 mg/dL   Total Protein 5.5 (L) 6.5 - 8.1 g/dL   Albumin 3.3 (L) 3.5 - 5.0 g/dL   AST 19 15 - 41 U/L   ALT 15 0 - 44 U/L   Alkaline Phosphatase 68 38 - 126 U/L   Total Bilirubin 0.9 0.0 - 1.2 mg/dL   GFR, Estimated 52 (L) >60 mL/min    Comment: (NOTE) Calculated using the CKD-EPI Creatinine Equation (2021)    Anion gap 7 5 - 15     Comment: Performed at [facility], [address] [place], [place] [address]  Glucose, capillary     Status: None   Collection Time: 01/31/24  9:04 AM  Result Value Ref Range   Glucose-Capillary 98 70 - 99 mg/dL    Comment: Glucose reference range applies only to samples taken after fasting for at least 8 hours.  POCT HgB A1C     Status: Abnormal   Collection Time: 02/04/24 11:00 AM  Result Value Ref Range   Hemoglobin A1C 6.2 (A) 4.0 - 5.6 %   HbA1c POC (<> result, manual entry) 6.2 4.0 - 5.6 %   HbA1c, POC (prediabetic range) 6.2 5.7 - 6.4 %   HbA1c, POC (controlled diabetic range) 6.2 0.0 - 7.0 %  Surgical pathology     Status: None   Collection Time: 02/05/24 12:00 AM  Result Value Ref Range   SURGICAL PATHOLOGY      SURGICAL PATHOLOGY [facility] [address] [place], [place] [address] Telephone [phone] or [phone] Fax [phone]  REPORT OF SURGICAL PATHOLOGY   Accession #: [id number] Patient Name: ESSICA, KIKER Visit # : [id number]  MRN: [id number] Physician: Dasie Tinnie MATSU. DOB/Age 08-30-1950 (Age: 42)  Gender: F Collected Date: 02/05/2024 Received Date: 02/06/2024  FINAL DIAGNOSIS       1. Vagina, biopsy,  :       POORLY DIFFERENTIATED CARCINOMA CONSISTENT WITH INVOLVEMENT BY THE PREVIOUSLY      DIAGNOSED ENDOMETRIAL CARCINOMA.      THERE IS SUFFICIENT TISSUE PRESENT FOR ANCILLARY STUDIES.       DATE SIGNED OUT: 02/07/2024 ELECTRONIC SIGNATURE : Belvie Come, John, Pathologist, Electronic Signature  MICROSCOPIC DESCRIPTION  CASE COMMENTS STAINS USED IN DIAGNOSIS: H&E    CLINICAL HISTORY  SPECIMEN(S) OBTAINED 1. Vagina, biopsy,  SPECIMEN COMMENTS: SPECIMEN CLINICAL INFORMATION: 1. Endometrial cancer    G ross Description 1. Received in formalin is a 0.9 x 0.7 x 0.3 cm fragment of red-[place] tissue, presumed margin is inked blue. Specimen is sectioned and submitted entirely in 1 block. mb  02-06-24        Report signed out from the following location(s) [place] [address] [place], [place] [address] CLIA #: [id number]  [facility] [address] [place] [employer] [place], [place] [address] CLIA #: [id number]   Magnesium      Status: None   Collection Time: 02/05/24  1:56 PM  Result Value Ref Range   Magnesium  1.9 1.7 - 2.4 mg/dL    Comment: Performed at [facility], [address] [place], [place] [address]  Hold Tube- Blood Bank     Status: None   Collection Time: 02/05/24  1:56 PM  Result Value Ref Range   Blood Bank Specimen SAMPLE AVAILABLE FOR TESTING    Sample Expiration      02/08/2024,2359 Performed at [facility], [address] [place], [place] [address]   CMP (Cancer Center only)     Status: Abnormal   Collection Time: 02/05/24  1:56 PM  Result Value Ref Range   Sodium 135 135 - 145 mmol/L   Potassium 3.3 (L) 3.5 - 5.1 mmol/L   Chloride 98 98 - 111 mmol/L   CO2 28 22 - 32 mmol/L   Glucose, Bld 246 (H) 70 - 99 mg/dL    Comment: Glucose reference range applies only to samples taken after fasting for at least 8 hours.   BUN 34 (H) 8 - 23 mg/dL   Creatinine 8.77 (H) 9.55 - 1.00 mg/dL   Calcium  8.4 (L) 8.9 - 10.3 mg/dL   Total Protein 5.4 (L) 6.5 - 8.1 g/dL   Albumin 3.2 (L) 3.5 - 5.0 g/dL   AST 17 15 - 41 U/L   ALT 12 0 - 44 U/L   Alkaline Phosphatase 68 38 - 126 U/L   Total Bilirubin 0.4 0.0 - 1.2 mg/dL   GFR, Estimated 47 (L) >60 mL/min    Comment: (NOTE) Calculated using the CKD-EPI Creatinine Equation (2021)    Anion gap 9 5 - 15    Comment: Performed at [facility], [address] [place], [place] [address]  CBC with Differential/Platelet     Status: Abnormal   Collection Time: 02/05/24  1:56 PM  Result Value Ref Range   WBC 4.8 4.0 - 10.5 K/uL   RBC 3.50 (L) 3.87 - 5.11 MIL/uL   Hemoglobin 10.0 (L) 12.0 - 15.0 g/dL   HCT 69.6 (L) 63.9 - 53.9 %   MCV 86.6 80.0 - 100.0 fL   MCH  28.6 26.0 - 34.0 pg   MCHC 33.0 30.0 - 36.0 g/dL   RDW 83.7 (H) 88.4 - 84.4 %   Platelets  162 150 - 400 K/uL   nRBC 0.0 0.0 - 0.2 %   Neutrophils Relative % 75 %   Neutro Abs 3.6 1.7 - 7.7 K/uL   Lymphocytes Relative 11 %   Lymphs Abs 0.5 (L) 0.7 - 4.0 K/uL   Monocytes Relative 10 %   Monocytes Absolute 0.5 0.1 - 1.0 K/uL   Eosinophils Relative 4 %   Eosinophils Absolute 0.2 0.0 - 0.5 K/uL   Basophils Relative 0 %   Basophils Absolute 0.0 0.0 - 0.1 K/uL   Immature Granulocytes 0 %   Abs Immature Granulocytes 0.02 0.00 - 0.07 K/uL    Comment: Performed at [facility], [address], [place], [place] [address]    Radiology NM PET Image Restag (PS) Skull Base To Thigh Result Date: 02/03/2024 CLINICAL DATA:  Subsequent treatment strategy for endometrial cancer. EXAM: NUCLEAR MEDICINE PET SKULL BASE TO THIGH TECHNIQUE: 7.9 mCi F-18 FDG was injected intravenously. Full-ring PET imaging was performed from the skull base to thigh after the radiotracer. CT data was obtained and used for attenuation correction and anatomic localization. Fasting blood glucose: 98 mg/dl COMPARISON:  93/82/7974. FINDINGS: Mediastinal blood pool activity: SUV max 2.1 Liver activity: SUV max NA NECK: No abnormal hypermetabolism. Incidental CT findings: None. CHEST: No abnormal hypermetabolism. Incidental CT findings: Right IJ Port-A-Cath terminates in the right atrium. Atherosclerotic calcification of the aorta, aortic valve and coronary arteries. Heart is enlarged. No pericardial or pleural effusion. ABDOMEN/PELVIS: Endometrial hypermetabolism has enlarged in scope and intensity, now 13.1, previously 11.8. Measurement on CT is challenging without IV contrast. No additional abnormal hypermetabolism. Incidental CT findings: Slight thickening of the right adrenal gland, unchanged. No specific follow-up necessary. Small hiatal hernia. SKELETON: Hypermetabolism associated with healing right L1, L2 L3 and L4 transverse  process fractures. Hypermetabolism associated with a subacute appearing fracture of the posteromedial eighth rib. Otherwise, no abnormal hypermetabolism. Incidental CT findings: Degenerative changes in the spine. IMPRESSION: 1. Endometrial hypermetabolism has increased slightly in scope and intensity, indicative of disease progression. No evidence of metastatic disease. 2. Healing posteromedial right eighth rib and right L1-L4 transverse process fractures. 3. Aortic atherosclerosis (ICD10-I70.0). Coronary artery calcification. Electronically Signed   By: Newell Eke M.D.   On: 02/03/2024 11:07    Assessment/Plan  Atherosclerosis of native arteries of extremity with intermittent claudication ABIs today are noncompressible but she has triphasic waveforms and normal digital waveforms bilaterally.  Continue Plavix  and Lipitor.  Recheck in 6 months.  Essential hypertension blood pressure control important in reducing the progression of atherosclerotic disease. On appropriate oral medications.     Type 2 diabetes mellitus with complication, with long-term current use of insulin  (HCC) blood glucose control important in reducing the progression of atherosclerotic disease. Also, involved in wound healing. On appropriate medications.     Hyperlipemia, mixed lipid control important in reducing the progression of atherosclerotic disease. Continue statin therapy  Selinda Gu, MD  02/07/2024 11:16 AM    This note was created with Dragon medical transcription system.  Any errors from dictation are purely unintentional

## 2024-02-09 ENCOUNTER — Other Ambulatory Visit: Payer: Self-pay

## 2024-02-10 ENCOUNTER — Telehealth: Payer: Self-pay

## 2024-02-10 ENCOUNTER — Encounter: Payer: Self-pay | Admitting: Pharmacist

## 2024-02-10 NOTE — Progress Notes (Signed)
 Chart Review Reason: Drug information Question - Patient notes CGM refill issue  Summary: Per [facility], PCP never signed order. Appears this was faxed to clinic last month, possibly lost.  Orders downloaded and added to PCP eFax folderfor review/signature. Once signed, can be faxed to DME company/[employer] (number on cover page, included in document).

## 2024-02-10 NOTE — Telephone Encounter (Signed)
 Pt notified of pt assistance meds ready for pickup.  Novolog  100units/ml 2 boxes Lot: [id number] Exp: 04/29/26

## 2024-02-11 ENCOUNTER — Telehealth: Payer: Self-pay

## 2024-02-11 NOTE — Telephone Encounter (Signed)
 Dispense form received needing provider signature, form placed in provider to be signed folder

## 2024-02-11 NOTE — Telephone Encounter (Signed)
 ER/PR, including % positivity of both ER/PR, requested on specimen [id number], vagina, collected 02/05/2024 with [facility].

## 2024-02-12 ENCOUNTER — Telehealth: Payer: Self-pay | Admitting: Nurse Practitioner

## 2024-02-12 ENCOUNTER — Telehealth: Payer: Self-pay | Admitting: Pharmacist

## 2024-02-12 DIAGNOSIS — E118 Type 2 diabetes mellitus with unspecified complications: Secondary | ICD-10-CM

## 2024-02-12 NOTE — Telephone Encounter (Signed)
 Patient's vb is full and MyChart message sent to call office to reschedule 02/28/2024 appointment. If patient calls please call office and ask for Darice or Luke.

## 2024-02-12 NOTE — Progress Notes (Signed)
 Brief Telephone Documentation Reason for Call: Patient left message regarding question for pharmacist  Summary of Call: Patient reports concern regarding something she read online stating [name] will not be covered by Medicare next year.   Assured her Medicare will still cover CGM systems for patient as long as they have diabetes and are using at least 1 insulin  injection daily.   Patient notes she elected benefits for 2026: [employer] PPO I plan Summary of Benefit 2026:  [email or web address]   Follow Up: Patient given direct line for further questions/concerns.  [name], PharmD Clinical Pharmacist [facility] [phone]

## 2024-02-13 ENCOUNTER — Other Ambulatory Visit: Payer: Self-pay

## 2024-02-13 NOTE — Telephone Encounter (Signed)
 Form faxed to [phone]

## 2024-02-18 ENCOUNTER — Telehealth: Payer: Self-pay

## 2024-02-18 NOTE — Telephone Encounter (Signed)
 PAP: Patient assistance application for Tresiba through [employer] has been mailed to [employer] home address on file. Provider portion of application will be faxed to provider's office.

## 2024-02-18 NOTE — Telephone Encounter (Signed)
 PAP: Patient assistance application for Breztri  through [employer] ([employer]) has been mailed to pt's home address on file. Provider portion of application will be faxed to provider's office.

## 2024-02-21 ENCOUNTER — Other Ambulatory Visit: Payer: Self-pay

## 2024-02-21 DIAGNOSIS — C541 Malignant neoplasm of endometrium: Secondary | ICD-10-CM

## 2024-02-24 ENCOUNTER — Encounter: Payer: Self-pay | Admitting: Oncology

## 2024-02-24 ENCOUNTER — Inpatient Hospital Stay

## 2024-02-24 ENCOUNTER — Inpatient Hospital Stay (HOSPITAL_BASED_OUTPATIENT_CLINIC_OR_DEPARTMENT_OTHER): Admitting: Oncology

## 2024-02-24 VITALS — BP 132/84 | HR 72 | Temp 97.8°F | Resp 16 | Wt 146.0 lb

## 2024-02-24 DIAGNOSIS — C541 Malignant neoplasm of endometrium: Secondary | ICD-10-CM | POA: Diagnosis not present

## 2024-02-24 LAB — CBC WITH DIFFERENTIAL/PLATELET
Abs Immature Granulocytes: 0.01 K/uL (ref 0.00–0.07)
Basophils Absolute: 0 K/uL (ref 0.0–0.1)
Basophils Relative: 0 %
Eosinophils Absolute: 0.1 K/uL (ref 0.0–0.5)
Eosinophils Relative: 2 %
HCT: 35.8 % — ABNORMAL LOW (ref 36.0–46.0)
Hemoglobin: 11.6 g/dL — ABNORMAL LOW (ref 12.0–15.0)
Immature Granulocytes: 0 %
Lymphocytes Relative: 9 %
Lymphs Abs: 0.4 K/uL — ABNORMAL LOW (ref 0.7–4.0)
MCH: 27.6 pg (ref 26.0–34.0)
MCHC: 32.4 g/dL (ref 30.0–36.0)
MCV: 85.2 fL (ref 80.0–100.0)
Monocytes Absolute: 0.4 K/uL (ref 0.1–1.0)
Monocytes Relative: 8 %
Neutro Abs: 3.6 K/uL (ref 1.7–7.7)
Neutrophils Relative %: 81 %
Platelets: 139 K/uL — ABNORMAL LOW (ref 150–400)
RBC: 4.2 MIL/uL (ref 3.87–5.11)
RDW: 14.4 % (ref 11.5–15.5)
WBC: 4.5 K/uL (ref 4.0–10.5)
nRBC: 0 % (ref 0.0–0.2)

## 2024-02-24 LAB — COMPREHENSIVE METABOLIC PANEL WITH GFR
ALT: 12 U/L (ref 0–44)
AST: 19 U/L (ref 15–41)
Albumin: 3.5 g/dL (ref 3.5–5.0)
Alkaline Phosphatase: 67 U/L (ref 38–126)
Anion gap: 9 (ref 5–15)
BUN: 19 mg/dL (ref 8–23)
CO2: 28 mmol/L (ref 22–32)
Calcium: 8.5 mg/dL — ABNORMAL LOW (ref 8.9–10.3)
Chloride: 96 mmol/L — ABNORMAL LOW (ref 98–111)
Creatinine, Ser: 0.98 mg/dL (ref 0.44–1.00)
GFR, Estimated: >60 mL/min (ref >60)
Glucose, Bld: 365 mg/dL — ABNORMAL HIGH (ref 70–99)
Potassium: 3.4 mmol/L — ABNORMAL LOW (ref 3.5–5.1)
Sodium: 133 mmol/L — ABNORMAL LOW (ref 135–145)
Total Bilirubin: 0.7 mg/dL (ref 0.0–1.2)
Total Protein: 5.8 g/dL — ABNORMAL LOW (ref 6.5–8.1)

## 2024-02-24 MED ORDER — MEGESTROL ACETATE 40 MG PO TABS: 80.0000 mg | ORAL_TABLET | Freq: Every day | ORAL | 2 refills | Status: DC

## 2024-02-24 NOTE — Progress Notes (Unsigned)
 Pt reports she unsure of what medications she is taking, states she is out of several of her medications.  States she is working to get that worked out, she is in the middle of updating her agricultural consultant. RN offered social worker to assist but patient declined.  Stated it didn't work very well the last time.

## 2024-02-25 ENCOUNTER — Encounter: Payer: Self-pay | Admitting: Oncology

## 2024-02-25 NOTE — Progress Notes (Signed)
 [facility]  [phone]) [phone] [phone]  ID: Terri [name] OB: Sep 23, 1950  MR#: [id number]  RDW#:[id number]  Patient Care Team: Gretel App, NP as PCP - General (Nurse Practitioner) Maurie Rayfield BIRCH, RN as Oncology Nurse Navigator Jacobo, Evalene PARAS, MD as Consulting Physician (Oncology) Kurtis Verdel Adler, MD (Inactive) as Referring Physician (Ophthalmology) [facility] Isadora Hose, MD as Consulting Physician (Pulmonary Disease) Geronimo Manuelita SAUNDERS, 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  INTERVAL HISTORY: Patient returns to clinic today for further evaluation and initiation of Megace .  She states her peripheral neuropathy is slightly improved and has not had any falls recently.  She continues to have chronic weakness and fatigue.  She has no other neurologic complaints.  She does not complain of vaginal bleeding today.  She denies any recent fevers or illnesses.  She has no chest pain, shortness of breath, cough, or hemoptysis.  She denies any nausea, vomiting, constipation, or diarrhea.  She has no urinary complaints.  Patient offers no further specific complaints today.  REVIEW OF SYSTEMS:   Review of Systems  Constitutional:  Positive for malaise/fatigue. Negative for fever and weight loss.  Respiratory: Negative.  Negative for cough, hemoptysis and shortness of breath.   Cardiovascular: Negative.  Negative for chest pain and leg swelling.  Gastrointestinal: Negative.  Negative for abdominal pain, blood in stool and melena.  Genitourinary: Negative.  Negative for dysuria.  Musculoskeletal: Negative.  Negative for back pain and falls.  Skin: Negative.  Negative for rash.  Neurological:  Positive for sensory change and weakness. Negative for dizziness, focal weakness and headaches.  Psychiatric/Behavioral: Negative.  The patient is not  nervous/anxious.     As per HPI. Otherwise, a complete review of systems is negative.  PAST MEDICAL HISTORY: Past Medical History:  Diagnosis Date   Adenomatous colon polyp    Aortic stenosis    Arthritis    Asthma    Cancer (HCC)    Claudication    COPD (chronic obstructive pulmonary disease) (HCC)    Depression    Diabetes mellitus without complication (HCC)    Esophageal dysphagia    GERD (gastroesophageal reflux disease)    Hyperlipemia    Hypertension    Obesity    Severe obesity (BMI 35.0-35.9 with comorbidity) (HCC)     PAST SURGICAL HISTORY: Past Surgical History:  Procedure Laterality Date   BLADDER SURGERY     COLONOSCOPY WITH PROPOFOL  N/A 08/05/2015   Procedure: COLONOSCOPY WITH PROPOFOL ;  Surgeon: Donnice Vaughn Manes, MD;  Location[facility] ENDOSCOPY;  Service: Endoscopy;  Laterality: N/A;   ESOPHAGOGASTRODUODENOSCOPY N/A 01/09/2022   Procedure: ESOPHAGOGASTRODUODENOSCOPY (EGD);  Surgeon: Maryruth Ole DASEN, MD;  Location: [facility] ENDOSCOPY;  Service: Endoscopy;  Laterality: N/A;   FOOT SURGERY Left    FRACTURE SURGERY     IR CV LINE INJECTION  06/22/2022   IR IMAGING GUIDED PORT INSERTION  06/05/2022   IR PORT REPAIR CENTRAL VENOUS ACCESS DEVICE  07/02/2022   LOWER EXTREMITY ANGIOGRAPHY Left 02/04/2023   Procedure: Lower Extremity Angiography;  Surgeon: Marea Selinda RAMAN, MD;  Location: [facility] INVASIVE CV LAB;  Service: Cardiovascular;  Laterality: Left;   ORIF ANKLE FRACTURE Left 03/26/2017   Procedure: OPEN REDUCTION INTERNAL FIXATION (ORIF) ANKLE FRACTURE;  Surgeon: Edie Norleen PARAS, MD;  Location: [facility] ORS;  Service: Orthopedics;  Laterality: Left;    FAMILY HISTORY: Family History  Problem Relation Age of Onset   CVA Mother  Diabetes Mother    CAD Father    Breast cancer Neg Hx     ADVANCED DIRECTIVES (Y/N):  N  HEALTH MAINTENANCE: Social History   Tobacco Use   Smoking status: Former    Current packs/day: 1.00    Average packs/day: 1 pack/day for 40.0 years  (40.0 ttl pk-yrs)    Types: Cigarettes   Smokeless tobacco: Never   Tobacco comments:    Quite 2015  Vaping Use   Vaping status: Never Used  Substance Use Topics   Alcohol use: No   Drug use: No     Colonoscopy:  PAP:  Bone density:  Lipid panel:  No Known Allergies  Current Outpatient Medications  Medication Sig Dispense Refill   albuterol  (VENTOLIN  HFA) 108 (90 Base) MCG/ACT inhaler Inhale 2 puffs into the lungs every 6 (six) hours as needed for wheezing or shortness of breath. 1 each 1   budesonide -glycopyrrolate-formoterol  (BREZTRI) 160-9-4.8 MCG/ACT AERO inhaler Inhale 2 puffs into the lungs 2 (two) times daily. Via PAP AZ&Me     Continuous Glucose Sensor (FREESTYLE LIBRE 3 PLUS SENSOR) MISC Change sensor every 15 days. 6 each 3   insulin  degludec (TRESIBA FLEXTOUCH) 100 UNIT/ML FlexTouch Pen Inject 20 Units into the skin daily. 20 units once daily - REPLACES Levemir      megestrol  (MEGACE ) 40 MG tablet Take 2 tablets (80 mg total) by mouth daily. 60 tablet 2   amLODipine  (NORVASC ) 10 MG tablet Take 1 tablet (10 mg total) by mouth daily. 90 tablet 3   atorvastatin  (LIPITOR) 80 MG tablet Take 1 tablet (80 mg total) by mouth daily. 90 tablet 3   buPROPion  (WELLBUTRIN  XL) 300 MG 24 hr tablet Take 1 tablet (300 mg total) by mouth daily. 90 tablet 0   Cholecalciferol  (VITAMIN D3) 50 MCG (2000 UT) capsule Take 2,000 Units by mouth daily.     citalopram  (CELEXA ) 20 MG tablet Take 1 tablet (20 mg total) by mouth daily. 90 tablet 3   cloNIDine  (CATAPRES ) 0.2 MG tablet Take 1 tablet (0.2 mg total) by mouth 2 (two) times daily. 180 tablet 3   clopidogrel  (PLAVIX ) 75 MG tablet Take 1 tablet (75 mg total) by mouth daily. (Patient not taking: Reported on 02/24/2024) 90 tablet 3   cyanocobalamin  (VITAMIN B12) 1000 MCG tablet Take 1 tablet (1,000 mcg total) by mouth daily. 90 tablet 3   insulin  aspart (NOVOLOG ) 100 UNIT/ML injection Inject 5 Units into the skin 3 (three) times daily before  meals. (Patient taking differently: Inject 6 Units into the skin 3 (three) times daily before meals.)     Iron , Ferrous Sulfate , 325 (65 Fe) MG TABS Take 1 tablet by mouth daily. (Patient not taking: Reported on 02/24/2024) 90 tablet 3   losartan -hydrochlorothiazide  (HYZAAR) 100-25 MG tablet Take 1 tablet by mouth daily. 90 tablet 3   metoprolol  succinate (TOPROL -XL) 50 MG 24 hr tablet Take 1 tablet (50 mg total) by mouth daily. 90 tablet 0   montelukast  (SINGULAIR ) 10 MG tablet Take 10 mg by mouth at bedtime.     ondansetron  (ZOFRAN ) 8 MG tablet Take 1 tablet (8 mg total) by mouth every 8 (eight) hours as needed for nausea or vomiting. 30 tablet 0   No current facility-administered medications for this visit.    OBJECTIVE: Vitals:   02/24/24 1017  BP: 132/84  Pulse: 72  Resp: 16  Temp: 97.8 F (36.6 C)  SpO2: 95%       Body mass index is 26.7 kg/m.  ECOG FS:1 - Symptomatic but completely ambulatory  General: Well-developed, well-nourished, no acute distress.  Sitting in a wheelchair. Eyes: Pink conjunctiva, anicteric sclera. HEENT: Normocephalic, moist mucous membranes. Lungs: No audible wheezing or coughing. Heart: Regular rate and rhythm. Abdomen: Soft, nontender, no obvious distention. Musculoskeletal: No edema, cyanosis, or clubbing. Neuro: Alert, answering all questions appropriately. Cranial nerves grossly intact. Skin: No rashes or petechiae noted. Psych: Normal affect.  LAB RESULTS:  Lab Results  Component Value Date   NA 133 (L) 02/24/2024   K 3.4 (L) 02/24/2024   CL 96 (L) 02/24/2024   CO2 28 02/24/2024   GLUCOSE 365 (H) 02/24/2024   BUN 19 02/24/2024   CREATININE 0.98 02/24/2024   CALCIUM  8.5 (L) 02/24/2024   PROT 5.8 (L) 02/24/2024   ALBUMIN 3.5 02/24/2024   AST 19 02/24/2024   ALT 12 02/24/2024   ALKPHOS 67 02/24/2024   BILITOT 0.7 02/24/2024   GFRNONAA >60 02/24/2024   GFRAA >60 03/27/2017    Lab Results  Component Value Date   WBC 4.5  02/24/2024   NEUTROABS 3.6 02/24/2024   HGB 11.6 (L) 02/24/2024   HCT 35.8 (L) 02/24/2024   MCV 85.2 02/24/2024   PLT 139 (L) 02/24/2024     STUDIES: VAS US  ABI WITH/WO TBI Result Date: 02/11/2024  LOWER EXTREMITY DOPPLER STUDY Patient Name:  Shayma Pfefferle  Date of Exam:   02/07/2024 Medical Rec #: [id number]    Accession #:    [id number] Date of Birth: [birth year]    Patient Gender: F Patient Age:   31 years Exam Location:  [facility] Procedure:      VAS US  ABI WITH/WO TBI Referring Phys: Selinda Gu --------------------------------------------------------------------------------  Indications: Claudication, and peripheral artery disease. High Risk Factors: Hypertension, hyperlipidemia. Other Factors: Over 10 years bilateral calf claudication.   Vascular Interventions: 02/24/2023: Aortogram and Selective Left Lower Extremity                         Angiogram. PTA of the Left SFA with 5 mm diameter by 15                         cm Length Lutonix drug coated angioplasty balloon. Stent                         placement to the Left SFA with 6 mm diameter by 12 cm                         Length Life stent. PTA of the Left EIA and Proximal CFA                         with 6 mm diameter by 12 cm length Lutonix drug coated                         angioplasty. Stent placement to the Right CIA with 9 mm                         diameter by 38 mm length Lifestream Stent. Comparison Study: 06/18/2023 Performing Technologist: Leafy Gibes RVS  Examination Guidelines: A complete evaluation includes at minimum, Doppler waveform signals and systolic blood pressure reading at the level of bilateral brachial, anterior tibial, and posterior tibial arteries, when  vessel segments are accessible. Bilateral testing is considered an integral part of a complete examination. Photoelectric Plethysmograph (PPG) waveforms and toe systolic pressure readings are included as required and additional duplex testing as needed.  Limited examinations for reoccurring indications may be performed as noted.  ABI Findings: +---------+------------------+-----+---------+----------------+ Right    Rt Pressure (mmHg)IndexWaveform Comment          +---------+------------------+-----+---------+----------------+ Brachial 241                             Non Compressible +---------+------------------+-----+---------+----------------+ ATA                             triphasic                 +---------+------------------+-----+---------+----------------+ PTA                             triphasic                 +---------+------------------+-----+---------+----------------+ Great Toe                       Normal                    +---------+------------------+-----+---------+----------------+ +---------+------------------+-----+---------+----------------+ Left     Lt Pressure (mmHg)IndexWaveform Comment          +---------+------------------+-----+---------+----------------+ Brachial 243                             Non Compressible +---------+------------------+-----+---------+----------------+ ATA                             triphasic                 +---------+------------------+-----+---------+----------------+ PTA                             triphasic                 +---------+------------------+-----+---------+----------------+ Great Toe                       Normal                    +---------+------------------+-----+---------+----------------+ +-------+-----------+-----------+------------+------------+ ABI/TBIToday's ABIToday's TBIPrevious ABIPrevious TBI +-------+-----------+-----------+------------+------------+ Right                        1.02        .76          +-------+-----------+-----------+------------+------------+ Left                         1.09        .84          +-------+-----------+-----------+------------+------------+  Summary: Right:  Unable to  obtain ABIs or TBIs due to Extremely High Blood Pressures that could not be occluded in the Arm. Normal Triphasic Waveforms seen at the level of the Ankle. Left:  Unable to obtain ABIs or TBIs due to Extremely High Blood Pressures that could not be occluded in the Arm. Normal Triphasic Waveforms seen at the level of the Ankle.  *See table(s) above for measurements and observations.  Electronically signed by Selinda Gu MD on 02/11/2024 at 9:09:18 AM.    Final    NM PET Image Restag (PS) Skull Base To Thigh Result Date: 02/03/2024 CLINICAL DATA:  Subsequent treatment strategy for endometrial cancer. EXAM: NUCLEAR MEDICINE PET SKULL BASE TO THIGH TECHNIQUE: 7.9 mCi F-18 FDG was injected intravenously. Full-ring PET imaging was performed from the skull base to thigh after the radiotracer. CT data was obtained and used for attenuation correction and anatomic localization. Fasting blood glucose: 98 mg/dl COMPARISON:  93/82/7974. FINDINGS: Mediastinal blood pool activity: SUV max 2.1 Liver activity: SUV max NA NECK: No abnormal hypermetabolism. Incidental CT findings: None. CHEST: No abnormal hypermetabolism. Incidental CT findings: Right IJ Port-A-Cath terminates in the right atrium. Atherosclerotic calcification of the aorta, aortic valve and coronary arteries. Heart is enlarged. No pericardial or pleural effusion. ABDOMEN/PELVIS: Endometrial hypermetabolism has enlarged in scope and intensity, now 13.1, previously 11.8. Measurement on CT is challenging without IV contrast. No additional abnormal hypermetabolism. Incidental CT findings: Slight thickening of the right adrenal gland, unchanged. No specific follow-up necessary. Small hiatal hernia. SKELETON: Hypermetabolism associated with healing right L1, L2 L3 and L4 transverse process fractures. Hypermetabolism associated with a subacute appearing fracture of the posteromedial eighth rib. Otherwise, no abnormal hypermetabolism. Incidental CT findings: Degenerative  changes in the spine. IMPRESSION: 1. Endometrial hypermetabolism has increased slightly in scope and intensity, indicative of disease progression. No evidence of metastatic disease. 2. Healing posteromedial right eighth rib and right L1-L4 transverse process fractures. 3. Aortic atherosclerosis (ICD10-I70.0). Coronary artery calcification. Electronically Signed   By: Newell Eke M.D.   On: 02/03/2024 11:07    ONCOLOGY HISTORY: Patient was initially hesitant to undergo chemotherapy and elected to do XRT only which was completed on April 18, 2022.  CT scan results from October 08, 2022 reviewed independently with no obvious evidence of progressive disease. She completed 6 cycles of carboplatinum, Taxol , and Keytruda  on October 03, 2022.  Patient then initiated maintenance Keytruda  on October 23, 2022.  PET scan results from January 15, 2023 reviewed independently with mild nonspecific residual hypermetabolism in the lower uterine segment as well as small bilateral common iliac nodes possibly suggesting nodal metastasis.  Patient was seen by gynecology oncology who determined surgical intervention is not an option.  They recommend discontinuing Keytruda  for progressive disease and initiating single agent Doxil  every 28 days.  Patient received 3 cycles of Doxil  before progression of disease her last dose was given on June 05, 2023.  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  ER 70%, PR 30%.  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  Repeat PET scan on October 15, 2023 reviewed independently with significant improvement of disease burden.  Internal exam by gynecology oncology at that time also confirmed disease improvement.  Patient subsequently underwent 3 additional cycles of single agent Taxol  completing on January 01, 2024.  Repeat PET scan on January 31, 2024 reviewed independently with  stable/possibly slight progression of disease.  No further chemotherapy is planned at this time.  Patient was given a prescription for 80 mg Megace  daily.  If patient is compliant and tolerant of treatment, will increase dosage to 80 mg twice per day.  No further intervention is needed.  Return to clinic in 4 weeks for further evaluation.  Port: Port revision successful.  Continue with port flushes every 6 weeks. Hyperglycemia: Blood glucose over 300 today.  Patient continues to have poor  blood glucose control.  Continue monitoring and treatment per primary care. Renal insufficiency: Resolved. Hypokalemia: Mild, monitor. Anemia: Hemoglobin continues to trend up and is now 11.6. Thrombocytopenia: Mild, monitor. Vaginal bleeding: Improved.  Continue follow-up with gynecology oncology as above.  Claudication symptoms: Resolved.  Patient underwent revascularization procedure on February 04, 2023.  Unclear if patient has been back to see vascular surgery. Right hamstring tendon rupture: Patient reports there is no plan for surgical repair. Rash: Resolved.  Likely secondary to Doxil  which has been discontinued.   Poor appetite/weight loss: Improved. Peripheral neuropathy: Patient reports this has improved.  Continue follow-up with neurology as recommended.   Falls: Denies any falls recently.  Patient now has 2 walkers at her home upstairs and downstairs.  She admits to only using them occasionally.  Patient expressed understanding and was in agreement with this plan. She also understands that She can call clinic at any time with any questions, concerns, or complaints.    Cancer Staging  Endometrial cancer [facility]) Staging form: Corpus Uteri - Carcinoma and Carcinosarcoma, AJCC 8th Edition - Clinical stage from 05/30/2022: FIGO Stage IIIB (cT3b, cN0, cM0) - Signed by Jacobo Evalene PARAS, MD on 05/30/2022 Stage prefix: Initial diagnosis   Evalene PARAS Jacobo, MD   02/25/2024 8:44 AM

## 2024-02-26 ENCOUNTER — Ambulatory Visit

## 2024-02-27 ENCOUNTER — Other Ambulatory Visit (HOSPITAL_COMMUNITY): Payer: Self-pay

## 2024-02-27 NOTE — Telephone Encounter (Signed)
 Received provider portion PAP application Novo Nordisk- tresiba.

## 2024-02-28 ENCOUNTER — Ambulatory Visit: Admitting: Nurse Practitioner

## 2024-02-28 ENCOUNTER — Encounter: Payer: Self-pay | Admitting: Oncology

## 2024-03-02 ENCOUNTER — Telehealth: Payer: Self-pay | Admitting: Pharmacist

## 2024-03-02 NOTE — Progress Notes (Signed)
 Brief Telephone Documentation Reason for Call: Patient left message regarding question for pharmacist   Summary of Call: Patient called stating her [name] reader is broken and she needs a new one.  Appears the reader is functioning just fine. It is not connecting with current sensor. Tried connecting sensor twice over the phone without success. Advised patient to call the [facility] support line for replacement sensor. She does have another sensor at home that she will try placing today.   Follow Up: Patient given direct line for further questions/concerns.  Terri [name], PharmD Clinical Pharmacist [facility] [phone]

## 2024-03-03 ENCOUNTER — Ambulatory Visit: Admitting: Physical Therapy

## 2024-03-04 ENCOUNTER — Ambulatory Visit: Attending: Student | Admitting: Physical Therapy

## 2024-03-04 DIAGNOSIS — R2681 Unsteadiness on feet: Secondary | ICD-10-CM | POA: Insufficient documentation

## 2024-03-04 DIAGNOSIS — R262 Difficulty in walking, not elsewhere classified: Secondary | ICD-10-CM | POA: Insufficient documentation

## 2024-03-04 DIAGNOSIS — M6281 Muscle weakness (generalized): Secondary | ICD-10-CM | POA: Insufficient documentation

## 2024-03-04 DIAGNOSIS — Z9181 History of falling: Secondary | ICD-10-CM | POA: Insufficient documentation

## 2024-03-04 NOTE — Therapy (Signed)
 OUTPATIENT PHYSICAL THERAPY NEURO EVALUATION   Patient Name: Terri [name] MRN: [id number] DOB:March 07, 1951, 73 y.o., female Today's Date: 03/04/2024   PCP: Gretel App, NP  REFERRING PROVIDER: Thomasena Sherran BROCKS, PA-C  END OF SESSION:   PT End of Session - 03/04/24 1020     Visit Number 1    Number of Visits 24    Date for Recertification  05/27/24    PT Start Time 0935    PT Stop Time 1017    PT Time Calculation (min) 42 min    Equipment Utilized During Treatment Gait belt    Activity Tolerance Patient tolerated treatment well    Behavior During Therapy WFL for tasks assessed/performed          Past Medical History:  Diagnosis Date   Adenomatous colon polyp    Aortic stenosis    Arthritis    Asthma    Cancer (HCC)    Claudication    COPD (chronic obstructive pulmonary disease) (HCC)    Depression    Diabetes mellitus without complication (HCC)    Esophageal dysphagia    GERD (gastroesophageal reflux disease)    Hyperlipemia    Hypertension    Obesity    Severe obesity (BMI 35.0-35.9 with comorbidity) (HCC)    Past Surgical History:  Procedure Laterality Date   BLADDER SURGERY     COLONOSCOPY WITH PROPOFOL  N/A 08/05/2015   Procedure: COLONOSCOPY WITH PROPOFOL ;  Surgeon: Donnice Vaughn Manes, MD;  Location: [facility] ENDOSCOPY;  Service: Endoscopy;  Laterality: N/A;   ESOPHAGOGASTRODUODENOSCOPY N/A 01/09/2022   Procedure: ESOPHAGOGASTRODUODENOSCOPY (EGD);  Surgeon: Maryruth Ole DASEN, MD;  Location: [facility] ENDOSCOPY;  Service: Endoscopy;  Laterality: N/A;   FOOT SURGERY Left    FRACTURE SURGERY     IR CV LINE INJECTION  06/22/2022   IR IMAGING GUIDED PORT INSERTION  06/05/2022   IR PORT REPAIR CENTRAL VENOUS ACCESS DEVICE  07/02/2022   LOWER EXTREMITY ANGIOGRAPHY Left 02/04/2023   Procedure: Lower Extremity Angiography;  Surgeon: Marea Selinda RAMAN, MD;  Location: [facility] INVASIVE CV LAB;  Service: Cardiovascular;  Laterality: Left;   ORIF ANKLE FRACTURE Left 03/26/2017    Procedure: OPEN REDUCTION INTERNAL FIXATION (ORIF) ANKLE FRACTURE;  Surgeon: Edie Norleen PARAS, MD;  Location: [facility] ORS;  Service: Orthopedics;  Laterality: Left;   Patient Active Problem List   Diagnosis Date Noted   Neuropathy 02/04/2024   Decreased hearing of both ears 02/04/2024   Rash 07/01/2023   Muscle strain of chest wall 07/01/2023   Peripheral vascular disease 05/14/2023   Personal history of fall 04/28/2023   COPD exacerbation (HCC) 03/29/2023   Dyspnea on exertion 02/02/2023   Vitamin D  deficiency 02/02/2023   Vitamin B 12 deficiency 02/02/2023   Diabetic eye exam (HCC) 02/02/2023   Aortic atherosclerosis 02/02/2023   Bradycardia 01/14/2023   Anemia 09/28/2022   Postmenopausal bleeding 01/30/2022   Endometrial cancer (HCC) 01/25/2022   Mild aortic stenosis 10/12/2020   Acute respiratory failure with hypoxia (HCC) 08/05/2015   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   Class 2 severe obesity due to excess calories with serious comorbidity in adult 08/19/2013   Obesity, Class II, BMI 35-39.9, with comorbidity 08/19/2013   Chronic obstructive pulmonary disease, unspecified (HCC) 07/31/2012   Asthma with chronic obstructive pulmonary disease (COPD) (HCC) 07/31/2012   Atherosclerosis of native arteries of extremity with intermittent claudication 03/12/2012   Tobacco use 03/12/2012   Hyperlipidemia associated with type 2 diabetes mellitus (HCC) 06/06/2011   Hypertension associated with diabetes (HCC) 06/06/2011  Mood disorder 06/06/2011   Type 2 diabetes with complication (HCC) 06/06/2011    ONSET DATE: it started when I was a kid - 2003 for imbalance  REFERRING DIAG:  R26.89 (ICD-10-CM) - Imbalance  G62.9 (ICD-10-CM) - Neuropathy    THERAPY DIAG:  Muscle weakness (generalized)  Difficulty in walking, not elsewhere classified  History of falling  Unsteadiness on feet  Rationale for Evaluation and Treatment: Rehabilitation  SUBJECTIVE:                                                                                                                                                                                              SUBJECTIVE STATEMENT:  Pt stating that she has cancer, but feels like neuropathy has had bigger impact on her function. Pt stating that she feels unbalanced, stating that she will be standing there & just fall to the ground. Pt stating that she feels like her legs are giving out, & the first time that she experienced this was while she was on the stairs. Pt stated that she went to the hospital for treatment after fall on the stairs.   Pt stated that as a kid, she would wake up in the middle of the night with sharp pains in her legs.   Of note, pt stating that she finished with chemotherapy ~2 mo ago.   Since it's onset, pt stating that she feels like her neuropathy has gotten better. Pt reports that recent change in shoes has helped. Pt stating that around the house, she is not really using the RW but is having to hold onto other stuff for support. Pt stating that she uses RW for going to appointments; per pt report, she doesn't go to grocery store or out in the community.   Pt stating that she has also been using a rolling stool to get around her kitchen & clean litter box for cats.    Pt accompanied by: self  PERTINENT HISTORY: Of note, pt also undergoing treatment for progressive endometrial cancer with cervical and vaginal involvement.   Per neurology note 01/20/24: Patient with PMH significant for hypertension, hyperlipidemia, diabetes, endometrial cancer (currently undergoing treatment). Patient presents for follow-up with ongoing imbalance and weakness. She did not start PT following last office visit given transportation issues. She is no longer taking gabapentin, however she is taking Lyrica 150 mg twice daily per external provider. Patient has previously been diagnosed with chronic severe sensorimotor polyneuropathy as well as advanced  lumbar spine degeneration. Again offered referral to physiatry, patient defers at this time Again discussed referral to outpatient physical therapy for gait and balance training,  however patient wishes to hold off at this time. Continue Lyrica 150 mg twice daily per external provider.    PAIN:  Are you having pain? No; pt stating that sometimes she gets L knee & shoulder pain in bed.   PRECAUTIONS: Fall and Other: pt reporting having port for chemo  RED FLAGS: None   WEIGHT BEARING RESTRICTIONS: No  FALLS: Has patient fallen in last 6 months? Pt unsure of when last fall was, estimates to be about last year. Pt notes that she did slip the other day where she knocked her bookcase over; pt reports no injuries.   LIVING ENVIRONMENT: Lives with: lives alone Lives in: House/apartment - town house Stairs: Yes: Internal: ~12 steps; on right going up and External: 3 steps; on right going up Has following equipment at home: Counselling psychologist, Walker - 2 wheeled, shower chair, and Grab bars  PLOF: Independent with basic ADLs  PATIENT GOALS: walk normally   OBJECTIVE:  Note: Objective measures were completed at Evaluation unless otherwise noted.  DIAGNOSTIC FINDINGS: Per neurology note 01/20/24: 10/08/2023 EMG IMPRESSION: There is electrodiagnostic evidence of severe sensorimotor polyneuropathy in the lower extremities.  09/06/2023 CT LUMBAR SPINE WO IMPRESSION:  1. No acute osseous abnormality in the Lumbar Spine. Chronic L1  compression fracture, lower thoracic hyperostosis related interbody  ankylosis including at T12-L1.  2. Advanced lumbar spine degeneration L3-L4 through L5-S1. Moderate  multifactorial spinal stenosis suspected at L3-L4, Mild at L4-L5.  Moderate bilateral L3, moderate to severe bilateral L4, and very  severe bilateral L5 nerve level foraminal stenoses. \ 3. Aortic Atherosclerosis (ICD10-I70.0).   06/05/2023 CT HEAD AND C SPINE WO CONTRAST IMPRESSION:   1. No acute intracranial pathology. Small-vessel white matter  disease.  2. Soft tissue contusion and hematoma of the right forehead.  3. No fracture or static subluxation of the cervical spine.  4. Moderate multilevel cervical disc degenerative disease.   12/19/2022 CT HEAD AND C SPINE WO CONTRAST IMPRESSION:  1. No acute intracranial abnormality.  2. Right orbital floor fracture with herniation of the inferior  rectus muscle body through the fracture site. Findings can be seen  in the setting of entrapment. Recommend ophthalmologic consultation.  3. Air in the intraconal compartment of the orbit abutting the right  optic nerve. No evidence of an intraorbital hematoma.  4. No acute fracture or traumatic subluxation of the cervical spine.  5. Subcutaneous emphysema that extends inferiorly to the level of  the thyroid  and superiorly to the level of the periorbital soft  tissues. This may be secondary to an additional occult maxillary  sinus injury, possibly along the anterior wall.    COGNITION: Overall cognitive status: pt reporting difficulty with memory following recent chemo; unable to provide clear timeline of recent falls/stumbles   SENSATION: PMH of neuropathy    POSTURE: rounded shoulders and forward head  LOWER EXTREMITY ROM:     Active  Right Eval Left Eval  Hip flexion    Hip extension    Hip abduction    Hip adduction    Hip internal rotation    Hip external rotation    Knee flexion    Knee extension    Ankle dorsiflexion    Ankle plantarflexion    Ankle inversion    Ankle eversion     (Blank rows = not tested)  LOWER EXTREMITY MMT:    MMT Right Eval Left Eval  Hip flexion    Hip extension    Hip abduction  Hip adduction    Hip internal rotation    Hip external rotation    Knee flexion    Knee extension    Ankle dorsiflexion    Ankle plantarflexion    Ankle inversion    Ankle eversion    (Blank rows = not tested)  BED MOBILITY:   Not tested  TRANSFERS: Sit to stand: CGA  Assistive device utilized: Environmental Consultant - 2 wheeled     Stand to sit: CGA  Assistive device utilized: Environmental Consultant - 2 wheeled     Chair to chair: Min A  Assistive device utilized: Environmental Consultant - 2 wheeled       RAMP:  Not tested  CURB:  Not tested  STAIRS: Not tested GAIT: Findings: Gait Characteristics: decreased stride length and narrow BOS, Distance walked: distance required for assessment, Assistive device utilized:Walker - 2 wheeled, and Level of assistance: CGA and Min A  FUNCTIONAL TESTS:  5 times sit to stand: 16.57 with RW for steadying, decreased eccentric control Timed up and go (TUG): to be assessed 3 minute walk test: to be assessed 10 meter walk test:  0.24 with RW, CGA-minA for posterior/R lateral LOBs BERG: to be assessed PATIENT SURVEYS:  ABC scale: The Activities-Specific Balance Confidence (ABC) Scale 0% 10 20 30  40 50 60 70 80 90 100% No confidence<->completely confident  "How confident are you that you will not lose your balance or become unsteady when you . . .   Date tested 03/04/24  Walk around the house 80%  2. Walk up or down stairs 100%  3. Bend over and pick up a slipper from in front of a closet floor 100%  4. Reach for a small can off a shelf at eye level 100%  5. Stand on tip toes and reach for something above your head 90%  6. Stand on a chair and reach for something 0%  7. Sweep the floor 80%  8. Walk outside the house to a car parked in the driveway 90%  9. Get into or out of a car 100%  10. Walk across a parking lot to the mall 0%  11. Walk up or down a ramp 0%  12. Walk in a crowded mall where people rapidly walk past you 0%  13. Are bumped into by people as you walk through the mall 0%  14. Step onto or off of an escalator while you are holding onto the railing 0%  15. Step onto or off an escalator while holding onto parcels such that you cannot hold onto the railing 0%  16. Walk outside on icy sidewalks 0%   Total: #/16 46.25%                                                                                                                                 TREATMENT DATE:     Five times Sit to Stand Test (FTSS) "Stand up and sit down as  quickly as possible 5 times, keeping your arms folded across your chest."    TIME: 16.57 seconds BUE support at walker upon standing, decreased eccentric control  Times > 13.6 seconds is associated with increased disability and morbidity (Guralnik, 2000) Times > 15 seconds is predictive of recurrent falls in healthy individuals aged 20 and older (Buatois, et al., 2008) Normal performance values in community dwelling individuals aged 80 and older (Bohannon, 2006): 60-69 years: 11.4 seconds 70-79 years: 12.6 seconds 80-89 years: 14.8 seconds  MCID: >= 2.3 seconds for Vestibular Disorders (Meretta, 2006)  Attempting to complete without AD, but pt reporting increased knee pain.   10 Meter Walk Test: Patient instructed to walk 10 meters (32.8 ft) as quickly and as safely as possible at their normal speed x2 and at a fast speed x2. Time measured from 2 meter mark to 8 meter mark to accommodate ramp-up and ramp-down.  Normal speed 1: 0.21 m/s (46.79 seconds) Normal speed 2: 0.27 m/s (36.68 seconds) Average: 0.24 m/s with RW, CGA-minA for posterior/R lateral LOBs  Cut off scores: <0.4 m/s = household Ambulator, 0.4-0.8 m/s = limited community Ambulator, >0.8 m/s = community Ambulator, >1.2 m/s = crossing a street, <1.0 = increased fall risk MCID 0.05 m/s (small), 0.13 m/s (moderate), 0.06 m/s (significant)  (ANPTA Core Set of Outcome Measures for Adults with Neurologic Conditions, 2018)   Based off of initial evaluation, PT with recommendation for use of RW for all mobility at this time.   PATIENT EDUCATION: Education details: POC, findings of evaluation, recommendation for use of RW for all mobility Person educated: Patient Education method:  Explanation and Verbal cues Education comprehension: verbalized understanding and needs further education  HOME EXERCISE PROGRAM: To be established at future session  GOALS: Goals reviewed with patient? Yes  SHORT TERM GOALS: Target date: 04/15/24  Patient will be independent with home exercise program to improve strength/mobility for increased functional independence with ADLs and mobility. Baseline: to be assessed Goal status: INITIAL   LONG TERM GOALS: Target date: 05/27/24  Patient (> 54 years old) will complete five times sit to stand test (5XSTS) in < 15 seconds indicating an increased LE strength and improved balance. Baseline: 16.57 seconds BUE support at walker upon standing, decreased eccentric control Goal status: INITIAL  2.  Patient will increase Berg Balance score by > 6 points to demonstrate decreased fall risk during functional activities. Baseline: to be assessed Goal status: INITIAL  3.  Patient will increase 3 minute walk test distance to >357ft for progression to community level ambulation, demonstrating improved gait endurance. Baseline: to be assessed Goal status: INITIAL  4.  Patient will increase 10 meter walk test to >0.6 m/s as to improve gait speed for better community ambulation and to reduce fall risk. Baseline: 0.24 m/s with RW, CGA-minA for posterior/R lateral LOBs Goal status: INITIAL  5.  Patient will reduce timed up and go to <15 seconds to reduce fall risk and demonstrate improved transfer/gait ability. Baseline: to be assessed Goal status: INITIAL  6.  Patient will improve ABC scale score  by >20% to demonstrate increased confidence with functional mobility and ADLs Baseline: 46.25% Goal status: INITIAL  ASSESSMENT:  CLINICAL IMPRESSION: Patient is a 73 y.o. female who was seen today for physical therapy evaluation and treatment for imbalance & neuropathy. Pt with decreased confidence with mobility as evidenced by ABC. Pt with very slow  gait speed & experiencing posterior & posteriolateral LOB to R requiring minA, consistent with household  ambulator level. Pt also with decreased functional strength as evidenced by 5xSTS test. Pt provided education on use of RW for all ambulation for safety based on findings of initial evaluation. Plan to continue to assess balance, endurance, & transfers at next session. Of note, pt poor historian & unable to accurately provide timeline of any recent falls. Pt also with decreased insight to deficits. The pt will benefit from further skilled PT to improve these deficits in order to increase QOL and ease/safety with ADLs.      OBJECTIVE IMPAIRMENTS: Abnormal gait, decreased activity tolerance, decreased balance, decreased coordination, decreased endurance, decreased knowledge of condition, decreased mobility, difficulty walking, decreased strength, and decreased safety awareness.   ACTIVITY LIMITATIONS: carrying, lifting, stairs, transfers, and locomotion level  PARTICIPATION LIMITATIONS: meal prep, cleaning, laundry, shopping, and community activity  PERSONAL FACTORS: Age, Sex, and 3+ comorbidities: CA, HTN, COPD, DM, depression are also affecting patient's functional outcome.   REHAB POTENTIAL: Fair    CLINICAL DECISION MAKING: Evolving/moderate complexity  EVALUATION COMPLEXITY: Moderate  PLAN:  PT FREQUENCY: 1-2x/week  PT DURATION: 12 weeks  PLANNED INTERVENTIONS: [id number]- PT Re-evaluation, 97750- Physical Performance Testing, 97110-Therapeutic exercises, 97530- Therapeutic activity, 97112- Neuromuscular re-education, 97535- Self Care, [id number]- Manual therapy, [phone]- Gait training, [phone]- Canalith repositioning, Patient/Family education, Balance training, Stair training, Joint mobilization, Vestibular training, Cryotherapy, and Moist heat  PLAN FOR NEXT SESSION:  -BERG -3 min walk test -TUG -Assess stair navigation -Continue to provide education on deficits, promote safety awareness &  use of AD -Establish HEP   Chiquita Silvan, Student-PT 03/04/2024, 6:30 PM

## 2024-03-06 ENCOUNTER — Ambulatory Visit: Admitting: Physical Therapy

## 2024-03-06 DIAGNOSIS — Z9181 History of falling: Secondary | ICD-10-CM

## 2024-03-06 DIAGNOSIS — R2681 Unsteadiness on feet: Secondary | ICD-10-CM

## 2024-03-06 DIAGNOSIS — M6281 Muscle weakness (generalized): Secondary | ICD-10-CM

## 2024-03-06 DIAGNOSIS — R262 Difficulty in walking, not elsewhere classified: Secondary | ICD-10-CM

## 2024-03-06 NOTE — Therapy (Signed)
 OUTPATIENT PHYSICAL THERAPY NEURO TREATMENT   Patient Name: Terri [name] MRN: [id number] DOB:[birth year], 73 y.o., female Today's Date: 03/06/2024  PCP: Gretel App, NP  REFERRING PROVIDER: Thomasena Sherran BROCKS, PA-C  END OF SESSION:   PT End of Session - 03/06/24 0912     Visit Number 2    Number of Visits 24    Date for Recertification  05/27/24    PT Start Time 0920    PT Stop Time 1000    PT Time Calculation (min) 40 min    Equipment Utilized During Treatment Gait belt    Activity Tolerance Patient tolerated treatment well    Behavior During Therapy Impulsive   pt had difficulty following verbal and visual cues, and limited safety awareness         Past Medical History:  Diagnosis Date   Adenomatous colon polyp    Aortic stenosis    Arthritis    Asthma    Cancer (HCC)    Claudication    COPD (chronic obstructive pulmonary disease) (HCC)    Depression    Diabetes mellitus without complication (HCC)    Esophageal dysphagia    GERD (gastroesophageal reflux disease)    Hyperlipemia    Hypertension    Obesity    Severe obesity (BMI 35.0-35.9 with comorbidity) (HCC)    Past Surgical History:  Procedure Laterality Date   BLADDER SURGERY     COLONOSCOPY WITH PROPOFOL  N/A 08/05/2015   Procedure: COLONOSCOPY WITH PROPOFOL ;  Surgeon: Donnice Vaughn Manes, MD;  Location: [facility] ENDOSCOPY;  Service: Endoscopy;  Laterality: N/A;   ESOPHAGOGASTRODUODENOSCOPY N/A 01/09/2022   Procedure: ESOPHAGOGASTRODUODENOSCOPY (EGD);  Surgeon: Maryruth Ole DASEN, MD;  Location: [facility] ENDOSCOPY;  Service: Endoscopy;  Laterality: N/A;   FOOT SURGERY Left    FRACTURE SURGERY     IR CV LINE INJECTION  06/22/2022   IR IMAGING GUIDED PORT INSERTION  06/05/2022   IR PORT REPAIR CENTRAL VENOUS ACCESS DEVICE  07/02/2022   LOWER EXTREMITY ANGIOGRAPHY Left 02/04/2023   Procedure: Lower Extremity Angiography;  Surgeon: Marea Selinda RAMAN, MD;  Location: [facility] INVASIVE CV LAB;  Service: Cardiovascular;  Laterality:  Left;   ORIF ANKLE FRACTURE Left 03/26/2017   Procedure: OPEN REDUCTION INTERNAL FIXATION (ORIF) ANKLE FRACTURE;  Surgeon: Edie Norleen PARAS, MD;  Location: [facility] ORS;  Service: Orthopedics;  Laterality: Left;   Patient Active Problem List   Diagnosis Date Noted   Neuropathy 02/04/2024   Decreased hearing of both ears 02/04/2024   Rash 07/01/2023   Muscle strain of chest wall 07/01/2023   Peripheral vascular disease 05/14/2023   Personal history of fall 04/28/2023   COPD exacerbation (HCC) 03/29/2023   Dyspnea on exertion 02/02/2023   Vitamin D  deficiency 02/02/2023   Vitamin B 12 deficiency 02/02/2023   Diabetic eye exam (HCC) 02/02/2023   Aortic atherosclerosis 02/02/2023   Bradycardia 01/14/2023   Anemia 09/28/2022   Postmenopausal bleeding 01/30/2022   Endometrial cancer (HCC) 01/25/2022   Mild aortic stenosis 10/12/2020   Acute respiratory failure with hypoxia (HCC) 08/05/2015   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   Class 2 severe obesity due to excess calories with serious comorbidity in adult 08/19/2013   Obesity, Class II, BMI 35-39.9, with comorbidity 08/19/2013   Chronic obstructive pulmonary disease, unspecified (HCC) 07/31/2012   Asthma with chronic obstructive pulmonary disease (COPD) (HCC) 07/31/2012   Atherosclerosis of native arteries of extremity with intermittent claudication 03/12/2012   Tobacco use 03/12/2012   Hyperlipidemia associated with type 2 diabetes mellitus (HCC) 06/06/2011  Hypertension associated with diabetes (HCC) 06/06/2011   Mood disorder 06/06/2011   Type 2 diabetes with complication (HCC) 06/06/2011    ONSET DATE: it started when I was a kid - 2003 for imbalance  REFERRING DIAG:  R26.89 (ICD-10-CM) - Imbalance  G62.9 (ICD-10-CM) - Neuropathy    THERAPY DIAG:  Difficulty in walking, not elsewhere classified  History of falling  Muscle weakness (generalized)  Unsteadiness on feet  Rationale for Evaluation and Treatment:  Rehabilitation  SUBJECTIVE:                                                                                                                                                                                             SUBJECTIVE STATEMENT:   03/06/24:  Pt presents to PT with concerns of left knee pain, stating she was prescribed a pain medication that she has yet to receive.   Per eval: Pt stating that she has cancer, but feels like neuropathy has had bigger impact on her function. Pt stating that she feels unbalanced, stating that she will be standing there & just fall to the ground. Pt stating that she feels like her legs are giving out, & the first time that she experienced this was while she was on the stairs. Pt stated that she went to the hospital for treatment after fall on the stairs.   Pt stated that as a kid, she would wake up in the middle of the night with sharp pains in her legs.   Of note, pt stating that she finished with chemotherapy ~2 mo ago.   Since it's onset, pt stating that she feels like her neuropathy has gotten better. Pt reports that recent change in shoes has helped. Pt stating that around the house, she is not really using the RW but is having to hold onto other stuff for support. Pt stating that she uses RW for going to appointments; per pt report, she doesn't go to grocery store or out in the community.   Pt stating that she has also been using a rolling stool to get around her kitchen & clean litter box for cats.    Pt accompanied by: self  PERTINENT HISTORY: Of note, pt also undergoing treatment for progressive endometrial cancer with cervical and vaginal involvement.   Per neurology note 01/20/24: Patient with PMH significant for hypertension, hyperlipidemia, diabetes, endometrial cancer (currently undergoing treatment). Patient presents for follow-up with ongoing imbalance and weakness. She did not start PT following last office visit given transportation  issues. She is no longer taking gabapentin, however she is taking Lyrica 150 mg twice daily per external provider. Patient  has previously been diagnosed with chronic severe sensorimotor polyneuropathy as well as advanced lumbar spine degeneration. Again offered referral to physiatry, patient defers at this time Again discussed referral to outpatient physical therapy for gait and balance training, however patient wishes to hold off at this time. Continue Lyrica 150 mg twice daily per external provider.    PAIN:  Are you having pain? No; pt stating that sometimes she gets L knee & shoulder pain in bed.   PRECAUTIONS: Fall and Other: pt reporting having port for chemo  RED FLAGS: None   WEIGHT BEARING RESTRICTIONS: No  FALLS: Has patient fallen in last 6 months? Pt unsure of when last fall was, estimates to be about last year. Pt notes that she did slip the other day where she knocked her bookcase over; pt reports no injuries.   LIVING ENVIRONMENT: Lives with: lives alone Lives in: House/apartment - town house Stairs: Yes: Internal: ~12 steps; on right going up and External: 3 steps; on right going up Has following equipment at home: Counselling psychologist, Walker - 2 wheeled, shower chair, and Grab bars  PLOF: Independent with basic ADLs  PATIENT GOALS: walk normally   OBJECTIVE:  Note: Objective measures were completed at Evaluation unless otherwise noted.  DIAGNOSTIC FINDINGS: Per neurology note 01/20/24: 10/08/2023 EMG IMPRESSION: There is electrodiagnostic evidence of severe sensorimotor polyneuropathy in the lower extremities.  09/06/2023 CT LUMBAR SPINE WO IMPRESSION:  1. No acute osseous abnormality in the Lumbar Spine. Chronic L1  compression fracture, lower thoracic hyperostosis related interbody  ankylosis including at T12-L1.  2. Advanced lumbar spine degeneration L3-L4 through L5-S1. Moderate  multifactorial spinal stenosis suspected at L3-L4, Mild at L4-L5.   Moderate bilateral L3, moderate to severe bilateral L4, and very  severe bilateral L5 nerve level foraminal stenoses. \ 3. Aortic Atherosclerosis (ICD10-I70.0).   06/05/2023 CT HEAD AND C SPINE WO CONTRAST IMPRESSION:  1. No acute intracranial pathology. Small-vessel white matter  disease.  2. Soft tissue contusion and hematoma of the right forehead.  3. No fracture or static subluxation of the cervical spine.  4. Moderate multilevel cervical disc degenerative disease.   12/19/2022 CT HEAD AND C SPINE WO CONTRAST IMPRESSION:  1. No acute intracranial abnormality.  2. Right orbital floor fracture with herniation of the inferior  rectus muscle body through the fracture site. Findings can be seen  in the setting of entrapment. Recommend ophthalmologic consultation.  3. Air in the intraconal compartment of the orbit abutting the right  optic nerve. No evidence of an intraorbital hematoma.  4. No acute fracture or traumatic subluxation of the cervical spine.  5. Subcutaneous emphysema that extends inferiorly to the level of  the thyroid  and superiorly to the level of the periorbital soft  tissues. This may be secondary to an additional occult maxillary  sinus injury, possibly along the anterior wall.    COGNITION: Overall cognitive status: pt reporting difficulty with memory following recent chemo; unable to provide clear timeline of recent falls/stumbles   SENSATION: PMH of neuropathy    POSTURE: rounded shoulders and forward head  LOWER EXTREMITY ROM:     Active  Right Eval Left Eval  Hip flexion    Hip extension    Hip abduction    Hip adduction    Hip internal rotation    Hip external rotation    Knee flexion    Knee extension    Ankle dorsiflexion    Ankle plantarflexion    Ankle inversion  Ankle eversion     (Blank rows = not tested)  LOWER EXTREMITY MMT:    MMT Right Eval Left Eval  Hip flexion    Hip extension    Hip abduction    Hip adduction     Hip internal rotation    Hip external rotation    Knee flexion    Knee extension    Ankle dorsiflexion    Ankle plantarflexion    Ankle inversion    Ankle eversion    (Blank rows = not tested)  BED MOBILITY:  Not tested  TRANSFERS: Sit to stand: CGA  Assistive device utilized: Environmental Consultant - 2 wheeled     Stand to sit: CGA  Assistive device utilized: Environmental Consultant - 2 wheeled     Chair to chair: Min A  Assistive device utilized: Environmental Consultant - 2 wheeled       RAMP:  Not tested  CURB:  Not tested  STAIRS: Not tested GAIT: Findings: Gait Characteristics: decreased stride length and narrow BOS, Distance walked: distance required for assessment, Assistive device utilized:Walker - 2 wheeled, and Level of assistance: CGA and Min A  FUNCTIONAL TESTS:  5 times sit to stand: 16.57 with RW for steadying, decreased eccentric control Timed up and go (TUG): to be assessed 3 minute walk test: to be assessed 10 meter walk test:  0.24 with RW, CGA-minA for posterior/R lateral LOBs BERG: to be assessed PATIENT SURVEYS:  ABC scale: The Activities-Specific Balance Confidence (ABC) Scale 0% 10 20 30  40 50 60 70 80 90 100% No confidence<->completely confident  "How confident are you that you will not lose your balance or become unsteady when you . . .   Date tested 03/04/24  Walk around the house 80%  2. Walk up or down stairs 100%  3. Bend over and pick up a slipper from in front of a closet floor 100%  4. Reach for a small can off a shelf at eye level 100%  5. Stand on tip toes and reach for something above your head 90%  6. Stand on a chair and reach for something 0%  7. Sweep the floor 80%  8. Walk outside the house to a car parked in the driveway 90%  9. Get into or out of a car 100%  10. Walk across a parking lot to the mall 0%  11. Walk up or down a ramp 0%  12. Walk in a crowded mall where people rapidly walk past you 0%  13. Are bumped into by people as you walk through the mall 0%  14.  Step onto or off of an escalator while you are holding onto the railing 0%  15. Step onto or off an escalator while holding onto parcels such that you cannot hold onto the railing 0%  16. Walk outside on icy sidewalks 0%  Total: #/16 46.25%  TREATMENT DATE: 03/06/2024  Physical Performance Test or Measurement: a  physical performance test(s) or measurement (eg,  musculoskeletal, functional capacity), with written report,  each 15 mins    PT instructed pt in TUG: 20.77 sec ( >13.5 sec indicates increased fall risk) performed with 2WW.   :  Pt completed 2:17 min of ; 193ft with 2WW -Pt had difficulty navigating doorways, with gait preference toward the right  Patient demonstrates increased fall risk as noted by score of  25/56 on Berg Balance Scale.  (<36= high risk for falls, close to 100%; 37-45 significant >80%; 46-51 moderate >50%; 52-55 lower >25%)  1 - Pt required frequent rest breaks and limited by knee pain  [facility] PT Assessment - 03/06/24 0001       Standardized Balance Assessment   Standardized Balance Assessment Berg Balance Test      Berg Balance Test   Sit to Stand Able to stand  independently using hands    Standing Unsupported Able to stand 30 seconds unsupported    Sitting with Back Unsupported but Feet Supported on Floor or Stool Able to sit safely and securely 2 minutes    Stand to Sit Controls descent by using hands    Transfers Able to transfer with verbal cueing and /or supervision    Standing Unsupported with Eyes Closed Able to stand 3 seconds    Standing Unsupported with Feet Together Able to place feet together independently but unable to hold for 30 seconds    From Standing, Reach Forward with Outstretched Arm Loses balance while trying/requires external support    From Standing Position, Pick up Object from Floor Able to  pick up shoe, needs supervision    From Standing Position, Turn to Look Behind Over each Shoulder Turn sideways only but maintains balance    Turn 360 Degrees Needs close supervision or verbal cueing    Standing Unsupported, Alternately Place Feet on Step/Stool Needs assistance to keep from falling or unable to try    Standing Unsupported, One Foot in Front Needs help to step but can hold 15 seconds    Standing on One Leg Unable to try or needs assist to prevent fall    Total Score 25           TE- To improve strength, endurance, mobility, and function of specific targeted muscle groups or improve joint range of motion or improve muscle flexibility   1 x 6 sit to stand with 2WW placed in front for safety and balance  Standing marches: 1 x 6 -Attempted marches for HEP but due to safety concerns and left knee discomfort exercise was deferred.  1x10 seated marches   1x10 LAQ  Self care  Based on findings from BERG, TUG, pt was instructed throughout session on using 2WW at all times, and to avoid furniture climbing along dresser and bookcases.   PATIENT EDUCATION: Education details: POC, findings of balance tests, recommendation for use of RW for all mobility Person educated: Patient Education method: Explanation and Verbal cues Education comprehension: verbalized understanding and needs further education  HOME EXERCISE PROGRAM: Access Code: [email or web address] URL: [email or web address] Date: 03/06/2024 Prepared by: Lonni Gainer  Exercises - Sit to stand with counter or walker support in front of you  - 1 x daily - 7 x weekly - 2 sets - 6 reps - Seated March  - 1 x daily - 7 x weekly - 2 sets - 10 reps - Seated Long Arc Quad  - 1 x  daily - 7 x weekly - 2 sets - 10 reps  GOALS: Goals reviewed with patient? Yes  SHORT TERM GOALS: Target date: 04/15/24  Patient will be independent with home exercise program to improve strength/mobility for increased functional  independence with ADLs and mobility. Baseline: to be assessed Goal status: INITIAL   LONG TERM GOALS: Target date: 05/27/24  Patient (> 15 years old) will complete five times sit to stand test (5XSTS) in < 15 seconds indicating an increased LE strength and improved balance. Baseline: 16.57 seconds BUE support at walker upon standing, decreased eccentric control Goal status: INITIAL  2.  Patient will increase Berg Balance score by > 6 points to demonstrate decreased fall risk during functional activities. Baseline: 25/56 Goal status: INITIAL  3.  Patient will increase 3 minute walk test distance to >351ft for progression to community level ambulation, demonstrating improved gait endurance. Baseline: Completed 2:17 min; 156ft Goal status: INITIAL  4.  Patient will increase 10 meter walk test to >0.6 m/s as to improve gait speed for better community ambulation and to reduce fall risk. Baseline: 0.24 m/s with RW, CGA-minA for posterior/R lateral LOBs Goal status: INITIAL  5.  Patient will reduce timed up and go to <15 seconds to reduce fall risk and demonstrate improved transfer/gait ability. Baseline: 20.77sec Goal status: INITIAL  6.  Patient will improve ABC scale score  by >20% to demonstrate increased confidence with functional mobility and ADLs Baseline: 46.25% Goal status: INITIAL  ASSESSMENT:  CLINICAL IMPRESSION: Pt presents to PT stating increased concern of knee pain that she felt was more limiting than her balance. Pt was unable to complete due to this discomfort. Throughout pt required verbal cues to avoid obstacles and prevent coming into contact with walls off to her right. TUG and BERG scores are strong indicators of increased fall risk. Pt has difficulty following verbal and visual instructions and limited safety awareness. Throughout pt was encouraged to use her walker rather than furniture climb, which she states she does on her dresser and bookcase. Pt was  instructed on HEP and she verbalized understanding. The pt will benefit from further skilled PT to improve these deficits in order to increase QOL and ease/safety with ADLs.      OBJECTIVE IMPAIRMENTS: Abnormal gait, decreased activity tolerance, decreased balance, decreased coordination, decreased endurance, decreased knowledge of condition, decreased mobility, difficulty walking, decreased strength, and decreased safety awareness.   ACTIVITY LIMITATIONS: carrying, lifting, stairs, transfers, and locomotion level  PARTICIPATION LIMITATIONS: meal prep, cleaning, laundry, shopping, and community activity  PERSONAL FACTORS: Age, Sex, and 3+ comorbidities: CA, HTN, COPD, DM, depression are also affecting patient's functional outcome.   REHAB POTENTIAL: Fair    CLINICAL DECISION MAKING: Evolving/moderate complexity  EVALUATION COMPLEXITY: Moderate  PLAN:  PT FREQUENCY: 1-2x/week  PT DURATION: 12 weeks  PLANNED INTERVENTIONS: 97164- PT Re-evaluation, 97750- Physical Performance Testing, 97110-Therapeutic exercises, 97530- Therapeutic activity, 97112- Neuromuscular re-education, 97535- Self Care, [id number]- Manual therapy, [phone]- Gait training, [phone]- Canalith repositioning, Patient/Family education, Balance training, Stair training, Joint mobilization, Vestibular training, Cryotherapy, and Moist heat  PLAN FOR NEXT SESSION:  -Assess stair navigation -Continue to provide education on deficits, promote safety awareness & use of AD - Assess visual field   Leonor Rode, SPT  03/06/2024, 10:38 AM

## 2024-03-10 ENCOUNTER — Ambulatory Visit

## 2024-03-12 ENCOUNTER — Ambulatory Visit

## 2024-03-12 NOTE — Therapy (Incomplete)
 OUTPATIENT PHYSICAL THERAPY NEURO TREATMENT   Patient Name: Terri [name] MRN: [id number] DOB:04/29/1951, 73 y.o., female Today's Date: 03/12/2024  PCP: Gretel App, NP  REFERRING PROVIDER: Thomasena Sherran BROCKS, PA-C  END OF SESSION:     Past Medical History:  Diagnosis Date   Adenomatous colon polyp    Aortic stenosis    Arthritis    Asthma    Cancer (HCC)    Claudication    COPD (chronic obstructive pulmonary disease) (HCC)    Depression    Diabetes mellitus without complication (HCC)    Esophageal dysphagia    GERD (gastroesophageal reflux disease)    Hyperlipemia    Hypertension    Obesity    Severe obesity (BMI 35.0-35.9 with comorbidity) (HCC)    Past Surgical History:  Procedure Laterality Date   BLADDER SURGERY     COLONOSCOPY WITH PROPOFOL  N/A 08/05/2015   Procedure: COLONOSCOPY WITH PROPOFOL ;  Surgeon: Donnice Vaughn Manes, MD;  Location: [facility] ENDOSCOPY;  Service: Endoscopy;  Laterality: N/A;   ESOPHAGOGASTRODUODENOSCOPY N/A 01/09/2022   Procedure: ESOPHAGOGASTRODUODENOSCOPY (EGD);  Surgeon: Maryruth Ole DASEN, MD;  Location: [facility] ENDOSCOPY;  Service: Endoscopy;  Laterality: N/A;   FOOT SURGERY Left    FRACTURE SURGERY     IR CV LINE INJECTION  06/22/2022   IR IMAGING GUIDED PORT INSERTION  06/05/2022   IR PORT REPAIR CENTRAL VENOUS ACCESS DEVICE  07/02/2022   LOWER EXTREMITY ANGIOGRAPHY Left 02/04/2023   Procedure: Lower Extremity Angiography;  Surgeon: Marea Selinda RAMAN, MD;  Location: [facility] INVASIVE CV LAB;  Service: Cardiovascular;  Laterality: Left;   ORIF ANKLE FRACTURE Left 03/26/2017   Procedure: OPEN REDUCTION INTERNAL FIXATION (ORIF) ANKLE FRACTURE;  Surgeon: Edie Norleen PARAS, MD;  Location: [facility] ORS;  Service: Orthopedics;  Laterality: Left;   Patient Active Problem List   Diagnosis Date Noted   Neuropathy 02/04/2024   Decreased hearing of both ears 02/04/2024   Rash 07/01/2023   Muscle strain of chest wall 07/01/2023   Peripheral vascular disease  05/14/2023   Personal history of fall 04/28/2023   COPD exacerbation (HCC) 03/29/2023   Dyspnea on exertion 02/02/2023   Vitamin D  deficiency 02/02/2023   Vitamin B 12 deficiency 02/02/2023   Diabetic eye exam (HCC) 02/02/2023   Aortic atherosclerosis 02/02/2023   Bradycardia 01/14/2023   Anemia 09/28/2022   Postmenopausal bleeding 01/30/2022   Endometrial cancer (HCC) 01/25/2022   Mild aortic stenosis 10/12/2020   Acute respiratory failure with hypoxia (HCC) 08/05/2015   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   Class 2 severe obesity due to excess calories with serious comorbidity in adult 08/19/2013   Obesity, Class II, BMI 35-39.9, with comorbidity 08/19/2013   Chronic obstructive pulmonary disease, unspecified (HCC) 07/31/2012   Asthma with chronic obstructive pulmonary disease (COPD) (HCC) 07/31/2012   Atherosclerosis of native arteries of extremity with intermittent claudication 03/12/2012   Tobacco use 03/12/2012   Hyperlipidemia associated with type 2 diabetes mellitus (HCC) 06/06/2011   Hypertension associated with diabetes (HCC) 06/06/2011   Mood disorder 06/06/2011   Type 2 diabetes with complication (HCC) 06/06/2011    ONSET DATE: it started when I was a kid - 2003 for imbalance  REFERRING DIAG:  R26.89 (ICD-10-CM) - Imbalance  G62.9 (ICD-10-CM) - Neuropathy    THERAPY DIAG:  No diagnosis found.  Rationale for Evaluation and Treatment: Rehabilitation  SUBJECTIVE:  SUBJECTIVE STATEMENT:   03/12/24:  Pt presents to PT with concerns of left knee pain, stating she was prescribed a pain medication that she has yet to receive.   Per eval: Pt stating that she has cancer, but feels like neuropathy has had bigger impact on her function. Pt stating that she feels unbalanced, stating that she will  be standing there & just fall to the ground. Pt stating that she feels like her legs are giving out, & the first time that she experienced this was while she was on the stairs. Pt stated that she went to the hospital for treatment after fall on the stairs.   Pt stated that as a kid, she would wake up in the middle of the night with sharp pains in her legs.   Of note, pt stating that she finished with chemotherapy ~2 mo ago.   Since it's onset, pt stating that she feels like her neuropathy has gotten better. Pt reports that recent change in shoes has helped. Pt stating that around the house, she is not really using the RW but is having to hold onto other stuff for support. Pt stating that she uses RW for going to appointments; per pt report, she doesn't go to grocery store or out in the community.   Pt stating that she has also been using a rolling stool to get around her kitchen & clean litter box for cats.    Pt accompanied by: self  PERTINENT HISTORY: Of note, pt also undergoing treatment for progressive endometrial cancer with cervical and vaginal involvement.   Per neurology note 01/20/24: Patient with PMH significant for hypertension, hyperlipidemia, diabetes, endometrial cancer (currently undergoing treatment). Patient presents for follow-up with ongoing imbalance and weakness. She did not start PT following last office visit given transportation issues. She is no longer taking gabapentin, however she is taking Lyrica 150 mg twice daily per external provider. Patient has previously been diagnosed with chronic severe sensorimotor polyneuropathy as well as advanced lumbar spine degeneration. Again offered referral to physiatry, patient defers at this time Again discussed referral to outpatient physical therapy for gait and balance training, however patient wishes to hold off at this time. Continue Lyrica 150 mg twice daily per external provider.    PAIN:  Are you having pain? No; pt  stating that sometimes she gets L knee & shoulder pain in bed.   PRECAUTIONS: Fall and Other: pt reporting having port for chemo  RED FLAGS: None   WEIGHT BEARING RESTRICTIONS: No  FALLS: Has patient fallen in last 6 months? Pt unsure of when last fall was, estimates to be about last year. Pt notes that she did slip the other day where she knocked her bookcase over; pt reports no injuries.   LIVING ENVIRONMENT: Lives with: lives alone Lives in: House/apartment - town house Stairs: Yes: Internal: ~12 steps; on right going up and External: 3 steps; on right going up Has following equipment at home: Counselling psychologist, Walker - 2 wheeled, shower chair, and Grab bars  PLOF: Independent with basic ADLs  PATIENT GOALS: walk normally   OBJECTIVE:  Note: Objective measures were completed at Evaluation unless otherwise noted.  DIAGNOSTIC FINDINGS: Per neurology note 01/20/24: 10/08/2023 EMG IMPRESSION: There is electrodiagnostic evidence of severe sensorimotor polyneuropathy in the lower extremities.  09/06/2023 CT LUMBAR SPINE WO IMPRESSION:  1. No acute osseous abnormality in the Lumbar Spine. Chronic L1  compression fracture, lower thoracic hyperostosis related interbody  ankylosis including at T12-L1.  2. Advanced lumbar spine degeneration L3-L4 through L5-S1. Moderate  multifactorial spinal stenosis suspected at L3-L4, Mild at L4-L5.  Moderate bilateral L3, moderate to severe bilateral L4, and very  severe bilateral L5 nerve level foraminal stenoses. \ 3. Aortic Atherosclerosis (ICD10-I70.0).   06/05/2023 CT HEAD AND C SPINE WO CONTRAST IMPRESSION:  1. No acute intracranial pathology. Small-vessel white matter  disease.  2. Soft tissue contusion and hematoma of the right forehead.  3. No fracture or static subluxation of the cervical spine.  4. Moderate multilevel cervical disc degenerative disease.   12/19/2022 CT HEAD AND C SPINE WO CONTRAST IMPRESSION:  1. No  acute intracranial abnormality.  2. Right orbital floor fracture with herniation of the inferior  rectus muscle body through the fracture site. Findings can be seen  in the setting of entrapment. Recommend ophthalmologic consultation.  3. Air in the intraconal compartment of the orbit abutting the right  optic nerve. No evidence of an intraorbital hematoma.  4. No acute fracture or traumatic subluxation of the cervical spine.  5. Subcutaneous emphysema that extends inferiorly to the level of  the thyroid  and superiorly to the level of the periorbital soft  tissues. This may be secondary to an additional occult maxillary  sinus injury, possibly along the anterior wall.    COGNITION: Overall cognitive status: pt reporting difficulty with memory following recent chemo; unable to provide clear timeline of recent falls/stumbles   SENSATION: PMH of neuropathy    POSTURE: rounded shoulders and forward head  LOWER EXTREMITY ROM:     Active  Right Eval Left Eval  Hip flexion    Hip extension    Hip abduction    Hip adduction    Hip internal rotation    Hip external rotation    Knee flexion    Knee extension    Ankle dorsiflexion    Ankle plantarflexion    Ankle inversion    Ankle eversion     (Blank rows = not tested)  LOWER EXTREMITY MMT:    MMT Right Eval Left Eval  Hip flexion    Hip extension    Hip abduction    Hip adduction    Hip internal rotation    Hip external rotation    Knee flexion    Knee extension    Ankle dorsiflexion    Ankle plantarflexion    Ankle inversion    Ankle eversion    (Blank rows = not tested)  BED MOBILITY:  Not tested  TRANSFERS: Sit to stand: CGA  Assistive device utilized: Environmental Consultant - 2 wheeled     Stand to sit: CGA  Assistive device utilized: Environmental Consultant - 2 wheeled     Chair to chair: Min A  Assistive device utilized: Environmental Consultant - 2 wheeled       RAMP:  Not tested  CURB:  Not tested  STAIRS: Not tested GAIT: Findings: Gait  Characteristics: decreased stride length and narrow BOS, Distance walked: distance required for assessment, Assistive device utilized:Walker - 2 wheeled, and Level of assistance: CGA and Min A  FUNCTIONAL TESTS:  5 times sit to stand: 16.57 with RW for steadying, decreased eccentric control Timed up and go (TUG): to be assessed 3 minute walk test: to be assessed 10 meter walk test:  0.24 with RW, CGA-minA for posterior/R lateral LOBs BERG: to be assessed PATIENT SURVEYS:  ABC scale: The Activities-Specific Balance Confidence (ABC) Scale 0% 10 20 30  40 50 60 70 80 90 100% No confidence<->completely confident  "  How confident are you that you will not lose your balance or become unsteady when you . . .   Date tested 03/04/24  Walk around the house 80%  2. Walk up or down stairs 100%  3. Bend over and pick up a slipper from in front of a closet floor 100%  4. Reach for a small can off a shelf at eye level 100%  5. Stand on tip toes and reach for something above your head 90%  6. Stand on a chair and reach for something 0%  7. Sweep the floor 80%  8. Walk outside the house to a car parked in the driveway 90%  9. Get into or out of a car 100%  10. Walk across a parking lot to the mall 0%  11. Walk up or down a ramp 0%  12. Walk in a crowded mall where people rapidly walk past you 0%  13. Are bumped into by people as you walk through the mall 0%  14. Step onto or off of an escalator while you are holding onto the railing 0%  15. Step onto or off an escalator while holding onto parcels such that you cannot hold onto the railing 0%  16. Walk outside on icy sidewalks 0%  Total: #/16 46.25%                                                                                                                                 TREATMENT DATE: 03/06/2024  Physical Performance Test or Measurement: a  physical performance test(s) or measurement (eg,  musculoskeletal, functional capacity), with written  report,  each 15 mins    PT instructed pt in TUG: 20.77 sec ( >13.5 sec indicates increased fall risk) performed with 2WW.   :  Pt completed 2:17 min of ; 151ft with 2WW -Pt had difficulty navigating doorways, with gait preference toward the right  Patient demonstrates increased fall risk as noted by score of  25/56 on Berg Balance Scale.  (<36= high risk for falls, close to 100%; 37-45 significant >80%; 46-51 moderate >50%; 52-55 lower >25%)  1 - Pt required frequent rest breaks and limited by knee pain     TE- To improve strength, endurance, mobility, and function of specific targeted muscle groups or improve joint range of motion or improve muscle flexibility   1 x 6 sit to stand with 2WW placed in front for safety and balance  Standing marches: 1 x 6 -Attempted marches for HEP but due to safety concerns and left knee discomfort exercise was deferred.  1x10 seated marches   1x10 LAQ  Self care  Based on findings from BERG, TUG, pt was instructed throughout session on using 2WW at all times, and to avoid furniture climbing along dresser and bookcases.   PATIENT EDUCATION: Education details: POC, findings of balance tests, recommendation for use of RW for all mobility Person educated: Patient Education method:  Explanation and Verbal cues Education comprehension: verbalized understanding and needs further education  HOME EXERCISE PROGRAM: Access Code: [email or web address] URL: [email or web address] Date: 03/06/2024 Prepared by: Lonni Gainer  Exercises - Sit to stand with counter or walker support in front of you  - 1 x daily - 7 x weekly - 2 sets - 6 reps - Seated March  - 1 x daily - 7 x weekly - 2 sets - 10 reps - Seated Long Arc Quad  - 1 x daily - 7 x weekly - 2 sets - 10 reps  GOALS: Goals reviewed with patient? Yes  SHORT TERM GOALS: Target date: 04/15/24  Patient will be independent with home exercise program to improve strength/mobility  for increased functional independence with ADLs and mobility. Baseline: to be assessed Goal status: INITIAL   LONG TERM GOALS: Target date: 05/27/24  Patient (> 89 years old) will complete five times sit to stand test (5XSTS) in < 15 seconds indicating an increased LE strength and improved balance. Baseline: 16.57 seconds BUE support at walker upon standing, decreased eccentric control Goal status: INITIAL  2.  Patient will increase Berg Balance score by > 6 points to demonstrate decreased fall risk during functional activities. Baseline: 25/56 Goal status: INITIAL  3.  Patient will increase 3 minute walk test distance to >365ft for progression to community level ambulation, demonstrating improved gait endurance. Baseline: Completed 2:17 min; 167ft Goal status: INITIAL  4.  Patient will increase 10 meter walk test to >0.6 m/s as to improve gait speed for better community ambulation and to reduce fall risk. Baseline: 0.24 m/s with RW, CGA-minA for posterior/R lateral LOBs Goal status: INITIAL  5.  Patient will reduce timed up and go to <15 seconds to reduce fall risk and demonstrate improved transfer/gait ability. Baseline: 20.77sec Goal status: INITIAL  6.  Patient will improve ABC scale score  by >20% to demonstrate increased confidence with functional mobility and ADLs Baseline: 46.25% Goal status: INITIAL  ASSESSMENT:  CLINICAL IMPRESSION: Pt presents to PT stating increased concern of knee pain that she felt was more limiting than her balance. Pt was unable to complete due to this discomfort. Throughout pt required verbal cues to avoid obstacles and prevent coming into contact with walls off to her right. TUG and BERG scores are strong indicators of increased fall risk. Pt has difficulty following verbal and visual instructions and limited safety awareness. Throughout pt was encouraged to use her walker rather than furniture climb, which she states she does on her  dresser and bookcase. Pt was instructed on HEP and she verbalized understanding. The pt will benefit from further skilled PT to improve these deficits in order to increase QOL and ease/safety with ADLs.      OBJECTIVE IMPAIRMENTS: Abnormal gait, decreased activity tolerance, decreased balance, decreased coordination, decreased endurance, decreased knowledge of condition, decreased mobility, difficulty walking, decreased strength, and decreased safety awareness.   ACTIVITY LIMITATIONS: carrying, lifting, stairs, transfers, and locomotion level  PARTICIPATION LIMITATIONS: meal prep, cleaning, laundry, shopping, and community activity  PERSONAL FACTORS: Age, Sex, and 3+ comorbidities: CA, HTN, COPD, DM, depression are also affecting patient's functional outcome.   REHAB POTENTIAL: Fair    CLINICAL DECISION MAKING: Evolving/moderate complexity  EVALUATION COMPLEXITY: Moderate  PLAN:  PT FREQUENCY: 1-2x/week  PT DURATION: 12 weeks  PLANNED INTERVENTIONS: [id number]- PT Re-evaluation, 97750- Physical Performance Testing, 97110-Therapeutic exercises, 97530- Therapeutic activity, [id number]- Neuromuscular re-education, 97535- Self Care, [id number]- Manual therapy, [id number]- Gait training, [phone]- Canalith repositioning, Patient/Family education,  Balance training, Stair training, Joint mobilization, Vestibular training, Cryotherapy, and Moist heat  PLAN FOR NEXT SESSION:  -Assess stair navigation -Continue to provide education on deficits, promote safety awareness & use of AD - Assess visual field   Leonor Rode, SPT  03/12/2024, 9:29 AM

## 2024-03-17 ENCOUNTER — Ambulatory Visit

## 2024-03-17 DIAGNOSIS — M6281 Muscle weakness (generalized): Secondary | ICD-10-CM

## 2024-03-17 DIAGNOSIS — Z9181 History of falling: Secondary | ICD-10-CM

## 2024-03-17 DIAGNOSIS — R2681 Unsteadiness on feet: Secondary | ICD-10-CM

## 2024-03-17 DIAGNOSIS — R262 Difficulty in walking, not elsewhere classified: Secondary | ICD-10-CM

## 2024-03-17 NOTE — Therapy (Addendum)
 OUTPATIENT PHYSICAL THERAPY TREATMENT  Patient Name: Terri [name] MRN: [id number] DOB:[birth year], 73 y.o., female Today's Date: 03/17/2024  PCP: Gretel App, NP  REFERRING PROVIDER: Thomasena Sherran BROCKS, PA-C  END OF SESSION:   PT End of Session - 03/17/24 0850     Visit Number 3    Number of Visits 24    Date for Recertification  05/27/24    Authorization Type Medicare Part A & B    Progress Note Due on Visit 10    PT Start Time 0845    PT Stop Time 0925    PT Time Calculation (min) 40 min    Equipment Utilized During Treatment Gait belt    Activity Tolerance Patient tolerated treatment well    Behavior During Therapy Restless;Impulsive           Past Medical History:  Diagnosis Date   Adenomatous colon polyp    Aortic stenosis    Arthritis    Asthma    Cancer (HCC)    Claudication    COPD (chronic obstructive pulmonary disease) (HCC)    Depression    Diabetes mellitus without complication (HCC)    Esophageal dysphagia    GERD (gastroesophageal reflux disease)    Hyperlipemia    Hypertension    Obesity    Severe obesity (BMI 35.0-35.9 with comorbidity) (HCC)    Past Surgical History:  Procedure Laterality Date   BLADDER SURGERY     COLONOSCOPY WITH PROPOFOL  N/A 08/05/2015   Procedure: COLONOSCOPY WITH PROPOFOL ;  Surgeon: Donnice Vaughn Manes, MD;  Location: [facility] ENDOSCOPY;  Service: Endoscopy;  Laterality: N/A;   ESOPHAGOGASTRODUODENOSCOPY N/A 01/09/2022   Procedure: ESOPHAGOGASTRODUODENOSCOPY (EGD);  Surgeon: Maryruth Ole DASEN, MD;  Location: [facility] ENDOSCOPY;  Service: Endoscopy;  Laterality: N/A;   FOOT SURGERY Left    FRACTURE SURGERY     IR CV LINE INJECTION  06/22/2022   IR IMAGING GUIDED PORT INSERTION  06/05/2022   IR PORT REPAIR CENTRAL VENOUS ACCESS DEVICE  07/02/2022   LOWER EXTREMITY ANGIOGRAPHY Left 02/04/2023   Procedure: Lower Extremity Angiography;  Surgeon: Marea Selinda RAMAN, MD;  Location: [facility] INVASIVE CV LAB;  Service: Cardiovascular;  Laterality:  Left;   ORIF ANKLE FRACTURE Left 03/26/2017   Procedure: OPEN REDUCTION INTERNAL FIXATION (ORIF) ANKLE FRACTURE;  Surgeon: Edie Norleen PARAS, MD;  Location: [facility] ORS;  Service: Orthopedics;  Laterality: Left;   Patient Active Problem List   Diagnosis Date Noted   Neuropathy 02/04/2024   Decreased hearing of both ears 02/04/2024   Rash 07/01/2023   Muscle strain of chest wall 07/01/2023   Peripheral vascular disease 05/14/2023   Personal history of fall 04/28/2023   COPD exacerbation (HCC) 03/29/2023   Dyspnea on exertion 02/02/2023   Vitamin D  deficiency 02/02/2023   Vitamin B 12 deficiency 02/02/2023   Diabetic eye exam (HCC) 02/02/2023   Aortic atherosclerosis 02/02/2023   Bradycardia 01/14/2023   Anemia 09/28/2022   Postmenopausal bleeding 01/30/2022   Endometrial cancer (HCC) 01/25/2022   Mild aortic stenosis 10/12/2020   Acute respiratory failure with hypoxia (HCC) 08/05/2015   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   Class 2 severe obesity due to excess calories with serious comorbidity in adult 08/19/2013   Obesity, Class II, BMI 35-39.9, with comorbidity 08/19/2013   Chronic obstructive pulmonary disease, unspecified (HCC) 07/31/2012   Asthma with chronic obstructive pulmonary disease (COPD) (HCC) 07/31/2012   Atherosclerosis of native arteries of extremity with intermittent claudication 03/12/2012   Tobacco use 03/12/2012   Hyperlipidemia associated with type 2  diabetes mellitus (HCC) 06/06/2011   Hypertension associated with diabetes (HCC) 06/06/2011   Mood disorder 06/06/2011   Type 2 diabetes with complication (HCC) 06/06/2011    ONSET DATE: it started when I was a kid - 2003 for imbalance  REFERRING DIAG:  R26.89 (ICD-10-CM) - Imbalance  G62.9 (ICD-10-CM) - Neuropathy   THERAPY DIAG:  Difficulty in walking, not elsewhere classified  History of falling  Muscle weakness (generalized)  Unsteadiness on feet  Rationale for Evaluation and Treatment:  Rehabilitation  SUBJECTIVE:                                                                                                                                                                                             SUBJECTIVE STATEMENT:  Pt says she had chemo 3 weeks ago. She has no more chemotherapies scheduled. Pt says she has memory problems and probably won't do her exercises at home. She isn't sure if she still has her handout. Pt reports a fall in the kitchen since her last visit here. She said she lost her balance due to neuropathy. She does not use RW in the house because there is no space for it.    PERTINENT HISTORY:   Of note, pt also undergoing treatment for progressive endometrial cancer with cervical and vaginal involvement.   Per neurology note 01/20/24: Patient with PMH significant for hypertension, hyperlipidemia, diabetes, endometrial cancer (currently undergoing treatment). Patient presents for follow-up with ongoing imbalance and weakness. She did not start PT following last office visit given transportation issues. She is no longer taking gabapentin, however she is taking Lyrica 150 mg twice daily per external provider. Patient has previously been diagnosed with chronic severe sensorimotor polyneuropathy as well as advanced lumbar spine degeneration. Again offered referral to physiatry, patient defers at this time Again discussed referral to outpatient physical therapy for gait and balance training, however patient wishes to hold off at this time. Continue Lyrica 150 mg twice daily per external provider.    PAIN:  Are you having pain? No pain at rest, but stops walking/standing several times in session due to Left knee pain (non during STS exercises)   PRECAUTIONS: Fall and Other: pt reporting having port for chemo  WEIGHT BEARING RESTRICTIONS: No  FALLS: Has patient fallen in last 6 months? Pt unsure of when last fall was, estimates to be about last year. Pt notes that  she did slip the other day where she knocked her bookcase over; pt reports no injuries.   LIVING ENVIRONMENT: Lives with: lives alone Lives in: House/apartment - town house Stairs: Yes: Internal: ~12 steps; on right going up  and External: 3 steps; on right going up Has following equipment at home: Quad cane small base, Walker - 2 wheeled, shower chair, and Grab bars  PLOF: Independent with basic ADLs  PATIENT GOALS: walk normally   OBJECTIVE:  Note: Objective measures were completed at Evaluation unless otherwise noted.  DIAGNOSTIC FINDINGS:  Per neurology note 01/20/24: 10/08/2023 EMG IMPRESSION: There is electrodiagnostic evidence of severe sensorimotor polyneuropathy in the lower extremities.  09/06/2023 CT LUMBAR SPINE WO IMPRESSION:  1. No acute osseous abnormality in the Lumbar Spine. Chronic L1  compression fracture, lower thoracic hyperostosis related interbody  ankylosis including at T12-L1.  2. Advanced lumbar spine degeneration L3-L4 through L5-S1. Moderate  multifactorial spinal stenosis suspected at L3-L4, Mild at L4-L5.  Moderate bilateral L3, moderate to severe bilateral L4, and very  severe bilateral L5 nerve level foraminal stenoses.  3. Aortic Atherosclerosis (ICD10-I70.0).   06/05/2023 CT HEAD AND C SPINE WO CONTRAST IMPRESSION:  1. No acute intracranial pathology. Small-vessel white matter  disease.  2. Soft tissue contusion and hematoma of the right forehead.  3. No fracture or static subluxation of the cervical spine.  4. Moderate multilevel cervical disc degenerative disease.   12/19/2022 CT HEAD AND C SPINE WO CONTRAST IMPRESSION:  1. No acute intracranial abnormality.  2. Right orbital floor fracture with herniation of the inferior  rectus muscle body through the fracture site. Findings can be seen  in the setting of entrapment. Recommend ophthalmologic consultation.  3. Air in the intraconal compartment of the orbit abutting the right  optic  nerve. No evidence of an intraorbital hematoma.  4. No acute fracture or traumatic subluxation of the cervical spine.  5. Subcutaneous emphysema that extends inferiorly to the level of  the thyroid  and superiorly to the level of the periorbital soft  tissues. This may be secondary to an additional occult maxillary  sinus injury, possibly along the anterior wall.    COGNITION: Overall cognitive status: pt reporting difficulty with memory following recent chemo; unable to provide clear timeline of recent falls/stumbles   GAIT: Findings: Gait Characteristics: decreased stride length and narrow BOS, Distance walked: distance required for assessment, Assistive device utilized:Walker - 2 wheeled, and Level of assistance: CGA and Min A  FUNCTIONAL TESTS:  5 times sit to stand: 16.57 with RW for steadying, decreased eccentric control Timed up and go (TUG): to be assessed 3 minute walk test: to be assessed 10 meter walk test:  0.24 with RW, CGA-minA for posterior/R lateral LOBs   PATIENT SURVEYS:  ABC scale 11/5: 46%                                                                                                                             TREATMENT DATE 03/17/24:  -supervision transfer to standing from transport chair, RW into treatment room -STS from chair, x10 hands ad lib (author helps pt attend to reps as pt has difficulty attending to counting) -marching seated c 2.5lb  AW x16  *extensive cues required for correct performance, but pt is unable to correctly attend to this exercise, movements become less specific overtime or she stops moving altogether.  -STS from chair x10, hands ad lib -overground AMB c RW, 2.5lb AW: walking into objects  on Rt including wall, drift toward right; pt stops at 14ft due to Left knee pain -overground AMB c RW 2.5lb AW: 38ft  Orthostatic vital signs 03/17/24:  -supine: 151/9mmHg 73bpm  -seated: 118/15mmHg 75bpm   -standing: 116/41mmHg 83bpm (unable to  remain standing until end, cites left knee pain)   PATIENT EDUCATION: Education details: POC, findings of balance tests, recommendation for use of RW for all mobility Person educated: Patient Education method: Explanation and Verbal cues Education comprehension: verbalized understanding and needs further education  HOME EXERCISE PROGRAM: Access Code: [email or web address] URL: [email or web address] Date: 03/06/2024 Prepared by: Lonni Gainer  Exercises - Sit to stand with counter or walker support in front of you  - 1 x daily - 7 x weekly - 2 sets - 6 reps - Seated March  - 1 x daily - 7 x weekly - 2 sets - 10 reps - Seated Long Arc Quad  - 1 x daily - 7 x weekly - 2 sets - 10 reps  GOALS: Goals reviewed with patient? Yes  SHORT TERM GOALS: Target date: 04/15/24  Patient will be independent with home exercise program to improve strength/mobility for increased functional independence with ADLs and mobility. Baseline: to be assessed Goal status: INITIAL   LONG TERM GOALS: Target date: 05/27/24  Patient (> 80 years old) will complete five times sit to stand test (5XSTS) in < 15 seconds indicating an increased LE strength and improved balance. Baseline: 16.57 seconds BUE support at walker upon standing, decreased eccentric control Goal status: INITIAL  2.  Patient will increase Berg Balance score by > 6 points to demonstrate decreased fall risk during functional activities. Baseline: 25/56 Goal status: INITIAL  3.  Patient will increase 3 minute walk test distance to >358ft for progression to community level ambulation, demonstrating improved gait endurance. Baseline: Completed 2:17 min; 129ft Goal status: INITIAL  4.  Patient will increase 10 meter walk test to >0.6 m/s as to improve gait speed for better community ambulation and to reduce fall risk. Baseline: 0.24 m/s with RW, CGA-minA for posterior/R lateral LOBs Goal status: INITIAL  5.  Patient will reduce timed up  and go to <15 seconds to reduce fall risk and demonstrate improved transfer/gait ability. Baseline: 20.77sec Goal status: INITIAL  6.  Patient will improve ABC scale score  by >20% to demonstrate increased confidence with functional mobility and ADLs Baseline: 46.25% Goal status: INITIAL  ASSESSMENT:  CLINICAL IMPRESSION: Pt showing difficulty following commands today with multi step commands, consistent inability to attend to task, exercises, rep counting, maintaining cues for BP assessment. Pt reports low likelihood of HEP performance due to  memory issues, but cannot explain how she remembers to take medications. Author also concerned about somewhat disheveled appearance given other situation factors. Pt's left knee pain more involved with standing/walking, more suspicious for lumbar etiology, will need to review past medical record. Pt also quite orthostatic today without symptoms. Author concerned about pt ability to safely come to PT here, but will continue to manage over next few sessions. Also need to look into whether pt has adequate support for safely living alone at home. Patient will benefit from skilled physical therapy intervention to reduce deficits and impairments identified in evaluation,  in order to reduce pain, improve quality of life, and maximize activity tolerance for ADL, IADL, and leisure/fitness. Physical therapy will help pt achieve long and short term goals of care.       OBJECTIVE IMPAIRMENTS: Abnormal gait, decreased activity tolerance, decreased balance, decreased coordination, decreased endurance, decreased knowledge of condition, decreased mobility, difficulty walking, decreased strength, and decreased safety awareness.   ACTIVITY LIMITATIONS: carrying, lifting, stairs, transfers, and locomotion level  PARTICIPATION LIMITATIONS: meal prep, cleaning, laundry, shopping, and community activity  PERSONAL FACTORS: Age, Sex, and 3+ comorbidities: CA, HTN, COPD, DM,  depression are also affecting patient's functional outcome.   REHAB POTENTIAL: Fair    CLINICAL DECISION MAKING: Evolving/moderate complexity  EVALUATION COMPLEXITY: Moderate  PLAN:  PT FREQUENCY: 1-2x/week  PT DURATION: 12 weeks  PLANNED INTERVENTIONS: 97164- PT Re-evaluation, 97750- Physical Performance Testing, 97110-Therapeutic exercises, 97530- Therapeutic activity, 97112- Neuromuscular re-education, 97535- Self Care, [id number]- Manual therapy, [phone]- Gait training, [phone]- Canalith repositioning, Patient/Family education, Balance training, Stair training, Joint mobilization, Vestibular training, Cryotherapy, and Moist heat  PLAN FOR NEXT SESSION:  -Assess stair navigation -Continue to provide education on deficits, promote safety awareness & use of AD - Assess visual field   9:35 AM, 03/17/24 Peggye JAYSON Linear, PT, DPT Physical Therapist - [facility]  Outpatient Physical Therapy- Main Campus [phone]

## 2024-03-20 ENCOUNTER — Ambulatory Visit: Admitting: Physical Therapy

## 2024-03-20 DIAGNOSIS — M6281 Muscle weakness (generalized): Secondary | ICD-10-CM | POA: Diagnosis not present

## 2024-03-20 DIAGNOSIS — Z9181 History of falling: Secondary | ICD-10-CM

## 2024-03-20 DIAGNOSIS — R2681 Unsteadiness on feet: Secondary | ICD-10-CM

## 2024-03-20 DIAGNOSIS — R262 Difficulty in walking, not elsewhere classified: Secondary | ICD-10-CM

## 2024-03-20 NOTE — Therapy (Signed)
 OUTPATIENT PHYSICAL THERAPY TREATMENT  Patient Name: Terri [name] MRN: [id number] DOB:[birth year], 73 y.o., female Today's Date: 03/20/2024  PCP: Gretel App, NP  REFERRING PROVIDER: Thomasena Sherran BROCKS, PA-C  END OF SESSION:   PT End of Session - 03/20/24 0855     Visit Number 4    Number of Visits 24    Date for Recertification  05/27/24    Authorization Type Medicare Part A & B    Progress Note Due on Visit 10    PT Start Time 0847    PT Stop Time 0927    PT Time Calculation (min) 40 min    Equipment Utilized During Treatment Gait belt    Activity Tolerance Patient tolerated treatment well    Behavior During Therapy Restless;Impulsive            Past Medical History:  Diagnosis Date   Adenomatous colon polyp    Aortic stenosis    Arthritis    Asthma    Cancer (HCC)    Claudication    COPD (chronic obstructive pulmonary disease) (HCC)    Depression    Diabetes mellitus without complication (HCC)    Esophageal dysphagia    GERD (gastroesophageal reflux disease)    Hyperlipemia    Hypertension    Obesity    Severe obesity (BMI 35.0-35.9 with comorbidity) (HCC)    Past Surgical History:  Procedure Laterality Date   BLADDER SURGERY     COLONOSCOPY WITH PROPOFOL  N/A 08/05/2015   Procedure: COLONOSCOPY WITH PROPOFOL ;  Surgeon: Donnice Vaughn Manes, MD;  Location: [facility] ENDOSCOPY;  Service: Endoscopy;  Laterality: N/A;   ESOPHAGOGASTRODUODENOSCOPY N/A 01/09/2022   Procedure: ESOPHAGOGASTRODUODENOSCOPY (EGD);  Surgeon: Maryruth Ole DASEN, MD;  Location: [facility] ENDOSCOPY;  Service: Endoscopy;  Laterality: N/A;   FOOT SURGERY Left    FRACTURE SURGERY     IR CV LINE INJECTION  06/22/2022   IR IMAGING GUIDED PORT INSERTION  06/05/2022   IR PORT REPAIR CENTRAL VENOUS ACCESS DEVICE  07/02/2022   LOWER EXTREMITY ANGIOGRAPHY Left 02/04/2023   Procedure: Lower Extremity Angiography;  Surgeon: Marea Selinda RAMAN, MD;  Location: [facility] INVASIVE CV LAB;  Service: Cardiovascular;   Laterality: Left;   ORIF ANKLE FRACTURE Left 03/26/2017   Procedure: OPEN REDUCTION INTERNAL FIXATION (ORIF) ANKLE FRACTURE;  Surgeon: Edie Norleen PARAS, MD;  Location: [facility] ORS;  Service: Orthopedics;  Laterality: Left;   Patient Active Problem List   Diagnosis Date Noted   Neuropathy 02/04/2024   Decreased hearing of both ears 02/04/2024   Rash 07/01/2023   Muscle strain of chest wall 07/01/2023   Peripheral vascular disease 05/14/2023   Personal history of fall 04/28/2023   COPD exacerbation (HCC) 03/29/2023   Dyspnea on exertion 02/02/2023   Vitamin D  deficiency 02/02/2023   Vitamin B 12 deficiency 02/02/2023   Diabetic eye exam (HCC) 02/02/2023   Aortic atherosclerosis 02/02/2023   Bradycardia 01/14/2023   Anemia 09/28/2022   Postmenopausal bleeding 01/30/2022   Endometrial cancer (HCC) 01/25/2022   Mild aortic stenosis 10/12/2020   Acute respiratory failure with hypoxia (HCC) 08/05/2015   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   Class 2 severe obesity due to excess calories with serious comorbidity in adult 08/19/2013   Obesity, Class II, BMI 35-39.9, with comorbidity 08/19/2013   Chronic obstructive pulmonary disease, unspecified (HCC) 07/31/2012   Asthma with chronic obstructive pulmonary disease (COPD) (HCC) 07/31/2012   Atherosclerosis of native arteries of extremity with intermittent claudication 03/12/2012   Tobacco use 03/12/2012   Hyperlipidemia associated with type  2 diabetes mellitus (HCC) 06/06/2011   Hypertension associated with diabetes (HCC) 06/06/2011   Mood disorder 06/06/2011   Type 2 diabetes with complication (HCC) 06/06/2011    ONSET DATE: it started when I was a kid - 2003 for imbalance  REFERRING DIAG:  R26.89 (ICD-10-CM) - Imbalance  G62.9 (ICD-10-CM) - Neuropathy   THERAPY DIAG:  Difficulty in walking, not elsewhere classified  Muscle weakness (generalized)  History of falling  Unsteadiness on feet  Rationale for Evaluation and Treatment:  Rehabilitation  SUBJECTIVE:                                                                                                                                                                                             SUBJECTIVE STATEMENT:   Pt reports she had significant knee pain since her last visit. No falls to report. Did lose her keys and hopes she can find them. She does a have a spare and way to access her home when PT inquires for safety reasons.    PERTINENT HISTORY:   Of note, pt also undergoing treatment for progressive endometrial cancer with cervical and vaginal involvement.   Per neurology note 01/20/24: Patient with PMH significant for hypertension, hyperlipidemia, diabetes, endometrial cancer (currently undergoing treatment). Patient presents for follow-up with ongoing imbalance and weakness. She did not start PT following last office visit given transportation issues. She is no longer taking gabapentin, however she is taking Lyrica 150 mg twice daily per external provider. Patient has previously been diagnosed with chronic severe sensorimotor polyneuropathy as well as advanced lumbar spine degeneration. Again offered referral to physiatry, patient defers at this time Again discussed referral to outpatient physical therapy for gait and balance training, however patient wishes to hold off at this time. Continue Lyrica 150 mg twice daily per external provider.    PAIN:  Are you having pain? No pain at rest, but stops walking/standing several times in session due to Left knee pain (non during STS exercises)   PRECAUTIONS: Fall and Other: pt reporting having port for chemo  WEIGHT BEARING RESTRICTIONS: No  FALLS: Has patient fallen in last 6 months? Pt unsure of when last fall was, estimates to be about last year. Pt notes that she did slip the other day where she knocked her bookcase over; pt reports no injuries.   LIVING ENVIRONMENT: Lives with: lives alone Lives in:  House/apartment - town house Stairs: Yes: Internal: ~12 steps; on right going up and External: 3 steps; on right going up Has following equipment at home: Quad cane small base, Walker - 2 wheeled, shower chair, and Grab bars  PLOF:  Independent with basic ADLs  PATIENT GOALS: walk normally   OBJECTIVE:  Note: Objective measures were completed at Evaluation unless otherwise noted.  DIAGNOSTIC FINDINGS:  Per neurology note 01/20/24: 10/08/2023 EMG IMPRESSION: There is electrodiagnostic evidence of severe sensorimotor polyneuropathy in the lower extremities.  09/06/2023 CT LUMBAR SPINE WO IMPRESSION:  1. No acute osseous abnormality in the Lumbar Spine. Chronic L1  compression fracture, lower thoracic hyperostosis related interbody  ankylosis including at T12-L1.  2. Advanced lumbar spine degeneration L3-L4 through L5-S1. Moderate  multifactorial spinal stenosis suspected at L3-L4, Mild at L4-L5.  Moderate bilateral L3, moderate to severe bilateral L4, and very  severe bilateral L5 nerve level foraminal stenoses.  3. Aortic Atherosclerosis (ICD10-I70.0).   06/05/2023 CT HEAD AND C SPINE WO CONTRAST IMPRESSION:  1. No acute intracranial pathology. Small-vessel white matter  disease.  2. Soft tissue contusion and hematoma of the right forehead.  3. No fracture or static subluxation of the cervical spine.  4. Moderate multilevel cervical disc degenerative disease.   12/19/2022 CT HEAD AND C SPINE WO CONTRAST IMPRESSION:  1. No acute intracranial abnormality.  2. Right orbital floor fracture with herniation of the inferior  rectus muscle body through the fracture site. Findings can be seen  in the setting of entrapment. Recommend ophthalmologic consultation.  3. Air in the intraconal compartment of the orbit abutting the right  optic nerve. No evidence of an intraorbital hematoma.  4. No acute fracture or traumatic subluxation of the cervical spine.  5. Subcutaneous emphysema  that extends inferiorly to the level of  the thyroid  and superiorly to the level of the periorbital soft  tissues. This may be secondary to an additional occult maxillary  sinus injury, possibly along the anterior wall.    COGNITION: Overall cognitive status: pt reporting difficulty with memory following recent chemo; unable to provide clear timeline of recent falls/stumbles   GAIT: Findings: Gait Characteristics: decreased stride length and narrow BOS, Distance walked: distance required for assessment, Assistive device utilized:Walker - 2 wheeled, and Level of assistance: CGA and Min A  FUNCTIONAL TESTS:  5 times sit to stand: 16.57 with RW for steadying, decreased eccentric control Timed up and go (TUG): to be assessed 3 minute walk test: to be assessed 10 meter walk test:  0.24 with RW, CGA-minA for posterior/R lateral LOBs   PATIENT SURVEYS:  ABC scale 11/5: 46%                                                                                                                             TREATMENT DATE 03/20/24:   Transfer with CGA to nustep from transport chair Nustep SPM setting for visual feedback for speed, frequent reminder to keep pace despite this but overall does well x 10 min, varying resistance ( 2-4 mostly) - B UE/ LE first 5 min, then rest for 1.5 min then LE only for last 5 min, overall well tolerated without knee pain complaint.  Gait with walker to seated in chair - CGA, with RW  Seated LAQ 3 x 10 reps with 2.5# AW - visual cues to alternate ea LE  Attempted standing march but pt complains of left knee pain exacerbation after 12 total reps ( 6 ea) and this was ceased ( pt stops abruptly and sits during activity)  Seated march with 2.5# AW 3 x 10- some knee pain on L, improved with PT holding knee joint to prevent excessive quad and HS activation, pt notes improvement.  Seated heel raise on incline with 2.5# AW - 3 x 15 - cues and visual demo for B movement and full ROM.   Seated toes raise on decline 3 x 15 reps, similar cues   PATIENT EDUCATION: Education details: Heavy cues throughout, instruction that we will avoid knee pain and exacerbations as much as we are able.  Person educated: Patient Education method: Explanation and Verbal cues Education comprehension: verbalized understanding and needs further education  HOME EXERCISE PROGRAM: Access Code: [email or web address] URL: [email or web address] Date: 03/06/2024 Prepared by: Lonni Gainer  Exercises - Sit to stand with counter or walker support in front of you  - 1 x daily - 7 x weekly - 2 sets - 6 reps - Seated March  - 1 x daily - 7 x weekly - 2 sets - 10 reps - Seated Long Arc Quad  - 1 x daily - 7 x weekly - 2 sets - 10 reps  GOALS: Goals reviewed with patient? Yes  SHORT TERM GOALS: Target date: 04/15/24  Patient will be independent with home exercise program to improve strength/mobility for increased functional independence with ADLs and mobility. Baseline: to be assessed Goal status: INITIAL   LONG TERM GOALS: Target date: 05/27/24  Patient (> 67 years old) will complete five times sit to stand test (5XSTS) in < 15 seconds indicating an increased LE strength and improved balance. Baseline: 16.57 seconds BUE support at walker upon standing, decreased eccentric control Goal status: INITIAL  2.  Patient will increase Berg Balance score by > 6 points to demonstrate decreased fall risk during functional activities. Baseline: 25/56 Goal status: INITIAL  3.  Patient will increase 3 minute walk test distance to >340ft for progression to community level ambulation, demonstrating improved gait endurance. Baseline: Completed 2:17 min; 159ft Goal status: INITIAL  4.  Patient will increase 10 meter walk test to >0.6 m/s as to improve gait speed for better community ambulation and to reduce fall risk. Baseline: 0.24 m/s with RW, CGA-minA for posterior/R lateral LOBs Goal status:  INITIAL  5.  Patient will reduce timed up and go to <15 seconds to reduce fall risk and demonstrate improved transfer/gait ability. Baseline: 20.77sec Goal status: INITIAL  6.  Patient will improve ABC scale score  by >20% to demonstrate increased confidence with functional mobility and ADLs Baseline: 46.25% Goal status: INITIAL  ASSESSMENT:  CLINICAL IMPRESSION: Pt intensity cut down significantly after last session pt complained of intense and unrelenting increase in knee pain. Pt seemed to tolerate decreased load well. Will continue with slow progression and continue with plan in future sessions.  Patient will benefit from skilled physical therapy intervention to reduce deficits and impairments identified in evaluation, in order to reduce pain, improve quality of life, and maximize activity tolerance for ADL, IADL, and leisure/fitness. Physical therapy will help pt achieve long and short term goals of care.       OBJECTIVE IMPAIRMENTS: Abnormal gait, decreased activity tolerance, decreased balance, decreased coordination,  decreased endurance, decreased knowledge of condition, decreased mobility, difficulty walking, decreased strength, and decreased safety awareness.   ACTIVITY LIMITATIONS: carrying, lifting, stairs, transfers, and locomotion level  PARTICIPATION LIMITATIONS: meal prep, cleaning, laundry, shopping, and community activity  PERSONAL FACTORS: Age, Sex, and 3+ comorbidities: CA, HTN, COPD, DM, depression are also affecting patient's functional outcome.   REHAB POTENTIAL: Fair    CLINICAL DECISION MAKING: Evolving/moderate complexity  EVALUATION COMPLEXITY: Moderate  PLAN:  PT FREQUENCY: 1-2x/week  PT DURATION: 12 weeks  PLANNED INTERVENTIONS: [id number]- PT Re-evaluation, 97750- Physical Performance Testing, 97110-Therapeutic exercises, 97530- Therapeutic activity, 97112- Neuromuscular re-education, 97535- Self Care, [id number]- Manual therapy, [phone]- Gait training, [phone]-  Canalith repositioning, Patient/Family education, Balance training, Stair training, Joint mobilization, Vestibular training, Cryotherapy, and Moist heat  PLAN FOR NEXT SESSION:  -Assess stair navigation -Continue to provide education on deficits, promote safety awareness & use of AD - Assess visual field -concerned about pt ability to safely come to PT here, but will continue to manage over next few sessions Monitor knee pain (most limiting per pt with standing exercises)   8:55 AM, 03/20/24 Note: Portions of this document were prepared using Dragon voice recognition software and although reviewed may contain unintentional dictation errors in syntax, grammar, or spelling.  Lonni KATHEE Gainer PT ,DPT Physical Therapist- [place]  [facility]

## 2024-03-23 ENCOUNTER — Inpatient Hospital Stay: Admitting: Nurse Practitioner

## 2024-03-23 ENCOUNTER — Ambulatory Visit: Admitting: Physical Therapy

## 2024-03-23 ENCOUNTER — Inpatient Hospital Stay: Admitting: Oncology

## 2024-03-23 ENCOUNTER — Inpatient Hospital Stay

## 2024-03-24 ENCOUNTER — Ambulatory Visit

## 2024-03-30 ENCOUNTER — Ambulatory Visit: Attending: Student | Admitting: Physical Therapy

## 2024-03-30 DIAGNOSIS — R262 Difficulty in walking, not elsewhere classified: Secondary | ICD-10-CM | POA: Diagnosis present

## 2024-03-30 DIAGNOSIS — M6281 Muscle weakness (generalized): Secondary | ICD-10-CM | POA: Diagnosis present

## 2024-03-30 DIAGNOSIS — R2681 Unsteadiness on feet: Secondary | ICD-10-CM | POA: Insufficient documentation

## 2024-03-30 DIAGNOSIS — Z9181 History of falling: Secondary | ICD-10-CM | POA: Diagnosis present

## 2024-03-30 NOTE — Therapy (Signed)
 OUTPATIENT PHYSICAL THERAPY TREATMENT  Patient Name: Terri [name] MRN: [id number] DOB:1951-03-17, 73 y.o., female Today's Date: 03/30/2024  PCP: Gretel App, NP  REFERRING PROVIDER: Thomasena Sherran BROCKS, PA-C  END OF SESSION:   PT End of Session - 03/30/24 0956     Visit Number 5    Number of Visits 24    Date for Recertification  05/27/24    Authorization Type Medicare Part A & B    Progress Note Due on Visit 10    PT Start Time 0845    PT Stop Time 0925    PT Time Calculation (min) 40 min    Equipment Utilized During Treatment Gait belt    Activity Tolerance Patient tolerated treatment well    Behavior During Therapy Restless;Impulsive            Past Medical History:  Diagnosis Date   Adenomatous colon polyp    Aortic stenosis    Arthritis    Asthma    Cancer (HCC)    Claudication    COPD (chronic obstructive pulmonary disease) (HCC)    Depression    Diabetes mellitus without complication (HCC)    Esophageal dysphagia    GERD (gastroesophageal reflux disease)    Hyperlipemia    Hypertension    Obesity    Severe obesity (BMI 35.0-35.9 with comorbidity) (HCC)    Past Surgical History:  Procedure Laterality Date   BLADDER SURGERY     COLONOSCOPY WITH PROPOFOL  N/A 08/05/2015   Procedure: COLONOSCOPY WITH PROPOFOL ;  Surgeon: Donnice Vaughn Manes, MD;  Location: [facility] ENDOSCOPY;  Service: Endoscopy;  Laterality: N/A;   ESOPHAGOGASTRODUODENOSCOPY N/A 01/09/2022   Procedure: ESOPHAGOGASTRODUODENOSCOPY (EGD);  Surgeon: Maryruth Ole DASEN, MD;  Location: [facility] ENDOSCOPY;  Service: Endoscopy;  Laterality: N/A;   FOOT SURGERY Left    FRACTURE SURGERY     IR CV LINE INJECTION  06/22/2022   IR IMAGING GUIDED PORT INSERTION  06/05/2022   IR PORT REPAIR CENTRAL VENOUS ACCESS DEVICE  07/02/2022   LOWER EXTREMITY ANGIOGRAPHY Left 02/04/2023   Procedure: Lower Extremity Angiography;  Surgeon: Marea Selinda RAMAN, MD;  Location: [facility] INVASIVE CV LAB;  Service: Cardiovascular;  Laterality:  Left;   ORIF ANKLE FRACTURE Left 03/26/2017   Procedure: OPEN REDUCTION INTERNAL FIXATION (ORIF) ANKLE FRACTURE;  Surgeon: Edie Norleen PARAS, MD;  Location: [facility] ORS;  Service: Orthopedics;  Laterality: Left;   Patient Active Problem List   Diagnosis Date Noted   Neuropathy 02/04/2024   Decreased hearing of both ears 02/04/2024   Rash 07/01/2023   Muscle strain of chest wall 07/01/2023   Peripheral vascular disease 05/14/2023   Personal history of fall 04/28/2023   COPD exacerbation (HCC) 03/29/2023   Dyspnea on exertion 02/02/2023   Vitamin D  deficiency 02/02/2023   Vitamin B 12 deficiency 02/02/2023   Diabetic eye exam (HCC) 02/02/2023   Aortic atherosclerosis 02/02/2023   Bradycardia 01/14/2023   Anemia 09/28/2022   Postmenopausal bleeding 01/30/2022   Endometrial cancer (HCC) 01/25/2022   Mild aortic stenosis 10/12/2020   Acute respiratory failure with hypoxia (HCC) 08/05/2015   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   Class 2 severe obesity due to excess calories with serious comorbidity in adult 08/19/2013   Obesity, Class II, BMI 35-39.9, with comorbidity 08/19/2013   Chronic obstructive pulmonary disease, unspecified (HCC) 07/31/2012   Asthma with chronic obstructive pulmonary disease (COPD) (HCC) 07/31/2012   Atherosclerosis of native arteries of extremity with intermittent claudication 03/12/2012   Tobacco use 03/12/2012   Hyperlipidemia associated with type  2 diabetes mellitus (HCC) 06/06/2011   Hypertension associated with diabetes (HCC) 06/06/2011   Mood disorder 06/06/2011   Type 2 diabetes with complication (HCC) 06/06/2011    ONSET DATE: it started when I was a kid - 2003 for imbalance  REFERRING DIAG:  R26.89 (ICD-10-CM) - Imbalance  G62.9 (ICD-10-CM) - Neuropathy   THERAPY DIAG:  Difficulty in walking, not elsewhere classified  Muscle weakness (generalized)  History of falling  Unsteadiness on feet  Rationale for Evaluation and Treatment:  Rehabilitation  SUBJECTIVE:                                                                                                                                                                                             SUBJECTIVE STATEMENT:   Pt reports having difficulty opening her door this morning, states it is due to weakness from chemo. She also mentioned confusion about my chart and her schedule not being updated.    PERTINENT HISTORY:   Of note, pt also undergoing treatment for progressive endometrial cancer with cervical and vaginal involvement.   Per neurology note 01/20/24: Patient with PMH significant for hypertension, hyperlipidemia, diabetes, endometrial cancer (currently undergoing treatment). Patient presents for follow-up with ongoing imbalance and weakness. She did not start PT following last office visit given transportation issues. She is no longer taking gabapentin, however she is taking Lyrica 150 mg twice daily per external provider. Patient has previously been diagnosed with chronic severe sensorimotor polyneuropathy as well as advanced lumbar spine degeneration. Again offered referral to physiatry, patient defers at this time Again discussed referral to outpatient physical therapy for gait and balance training, however patient wishes to hold off at this time. Continue Lyrica 150 mg twice daily per external provider.    PAIN:  Are you having pain? No pain at rest, but stops walking/standing several times in session due to Left knee pain (non during STS exercises)   PRECAUTIONS: Fall and Other: pt reporting having port for chemo  WEIGHT BEARING RESTRICTIONS: No  FALLS: Has patient fallen in last 6 months? Pt unsure of when last fall was, estimates to be about last year. Pt notes that she did slip the other day where she knocked her bookcase over; pt reports no injuries.   LIVING ENVIRONMENT: Lives with: lives alone Lives in: House/apartment - town house Stairs: Yes:  Internal: ~12 steps; on right going up and External: 3 steps; on right going up Has following equipment at home: Counselling psychologist, Walker - 2 wheeled, shower chair, and Grab bars  PLOF: Independent with basic ADLs  PATIENT GOALS: walk normally   OBJECTIVE:  Note: Objective measures were completed at Evaluation unless otherwise noted.  DIAGNOSTIC FINDINGS:  Per neurology note 01/20/24: 10/08/2023 EMG IMPRESSION: There is electrodiagnostic evidence of severe sensorimotor polyneuropathy in the lower extremities.  09/06/2023 CT LUMBAR SPINE WO IMPRESSION:  1. No acute osseous abnormality in the Lumbar Spine. Chronic L1  compression fracture, lower thoracic hyperostosis related interbody  ankylosis including at T12-L1.  2. Advanced lumbar spine degeneration L3-L4 through L5-S1. Moderate  multifactorial spinal stenosis suspected at L3-L4, Mild at L4-L5.  Moderate bilateral L3, moderate to severe bilateral L4, and very  severe bilateral L5 nerve level foraminal stenoses.  3. Aortic Atherosclerosis (ICD10-I70.0).   06/05/2023 CT HEAD AND C SPINE WO CONTRAST IMPRESSION:  1. No acute intracranial pathology. Small-vessel white matter  disease.  2. Soft tissue contusion and hematoma of the right forehead.  3. No fracture or static subluxation of the cervical spine.  4. Moderate multilevel cervical disc degenerative disease.   12/19/2022 CT HEAD AND C SPINE WO CONTRAST IMPRESSION:  1. No acute intracranial abnormality.  2. Right orbital floor fracture with herniation of the inferior  rectus muscle body through the fracture site. Findings can be seen  in the setting of entrapment. Recommend ophthalmologic consultation.  3. Air in the intraconal compartment of the orbit abutting the right  optic nerve. No evidence of an intraorbital hematoma.  4. No acute fracture or traumatic subluxation of the cervical spine.  5. Subcutaneous emphysema that extends inferiorly to the level of   the thyroid  and superiorly to the level of the periorbital soft  tissues. This may be secondary to an additional occult maxillary  sinus injury, possibly along the anterior wall.    COGNITION: Overall cognitive status: pt reporting difficulty with memory following recent chemo; unable to provide clear timeline of recent falls/stumbles   GAIT: Findings: Gait Characteristics: decreased stride length and narrow BOS, Distance walked: distance required for assessment, Assistive device utilized:Walker - 2 wheeled, and Level of assistance: CGA and Min A  FUNCTIONAL TESTS:  5 times sit to stand: 16.57 with RW for steadying, decreased eccentric control Timed up and go (TUG): to be assessed 3 minute walk test: to be assessed 10 meter walk test:  0.24 with RW, CGA-minA for posterior/R lateral LOBs   PATIENT SURVEYS:  ABC scale 11/5: 46%                                                                                                                             TREATMENT DATE 03/30/24:   TA- To improve functional movements patterns for everyday tasks   Transfer with CGA to nustep from transport chair  Nustep SPM setting for visual feedback for speed, frequent reminder to keep pace x 8 min, Level 3- B UE/ LE first 5 min, then rest for 1.5 min then LE only for last 3 min - Pt stated she couldn't do anymore and abruptly stopped at 8 min  Gait with walker to seated  in chair - CGA, with RW   TE- To improve strength, endurance, mobility, and function of specific targeted muscle groups or improve joint range of motion or improve muscle flexibility  Seated march with 2.5# AW 2 x 10- pt requires verbal, tactile, and visual cues to maintain knee flexion height.   Seated heel raise on incline with 2.5# AW - 3 x 10 - cues and visual demo for B movement and full ROM.   Seated toes raise on decline 3 x 10 reps, similar cues    NMR: To facilitate reeducation of movement, balance, posture, coordination,  and/or proprioception/kinesthetic sense.   Standing narrow stance balance on firm surface 3 x 30 sec attempts- pt demonstrates increased sway, one lateral LOB, and frequent UE support  Alternating fwd step at bar with UE support 2 x 10 ea- verbal and visual cues required  Pt required occasional rest breaks due fatigue, PT was attentive to when pt appeared to be tired or winded in order to prevent excessive fatigue.   PATIENT EDUCATION: Education details: Heavy cues throughout, instruction that we will avoid knee pain and exacerbations as much as we are able.  Person educated: Patient Education method: Explanation and Verbal cues Education comprehension: verbalized understanding and needs further education  HOME EXERCISE PROGRAM: Access Code: [email or web address] URL: [email or web address] Date: 03/06/2024 Prepared by: Lonni Gainer  Exercises - Sit to stand with counter or walker support in front of you  - 1 x daily - 7 x weekly - 2 sets - 6 reps - Seated March  - 1 x daily - 7 x weekly - 2 sets - 10 reps - Seated Long Arc Quad  - 1 x daily - 7 x weekly - 2 sets - 10 reps  GOALS: Goals reviewed with patient? Yes  SHORT TERM GOALS: Target date: 04/15/24  Patient will be independent with home exercise program to improve strength/mobility for increased functional independence with ADLs and mobility. Baseline: to be assessed Goal status: INITIAL   LONG TERM GOALS: Target date: 05/27/24  Patient (> 63 years old) will complete five times sit to stand test (5XSTS) in < 15 seconds indicating an increased LE strength and improved balance. Baseline: 16.57 seconds BUE support at walker upon standing, decreased eccentric control Goal status: INITIAL  2.  Patient will increase Berg Balance score by > 6 points to demonstrate decreased fall risk during functional activities. Baseline: 25/56 Goal status: INITIAL  3.  Patient will increase 3 minute walk test distance to >370ft for  progression to community level ambulation, demonstrating improved gait endurance. Baseline: Completed 2:17 min; 134ft Goal status: INITIAL  4.  Patient will increase 10 meter walk test to >0.6 m/s as to improve gait speed for better community ambulation and to reduce fall risk. Baseline: 0.24 m/s with RW, CGA-minA for posterior/R lateral LOBs Goal status: INITIAL  5.  Patient will reduce timed up and go to <15 seconds to reduce fall risk and demonstrate improved transfer/gait ability. Baseline: 20.77sec Goal status: INITIAL  6.  Patient will improve ABC scale score  by >20% to demonstrate increased confidence with functional mobility and ADLs Baseline: 46.25% Goal status: INITIAL  ASSESSMENT:  CLINICAL IMPRESSION: Pt arrived with good motivation for completion on PT activities. Pt seemed to tolerate session well, with only minimal discomfort of knee noted during standing balance exercise. This session consisted of working on endurance with the nustep which was terminated abruptly by patient after 8 min. Seated exercises did not elicit any discomfort,  but required frequent verbal, tactile, and visual cues. Pt was most challenged by standing balance, demonstrating increased postural sway and lateral LOB. Pt required seated breaks between bouts of balance to off load right knee. Pt demonstrates difficulty following direction and required min A to sit into transport chair at end of session. SPT continues to encourage walker use at home, but limited space in residence prevents some use. Patient will benefit from skilled physical therapy intervention to reduce deficits and impairments identified in evaluation, in order to reduce pain, improve quality of life, and maximize activity tolerance for ADL, IADL, and leisure/fitness. Physical therapy will help pt achieve long and short term goals of care.       OBJECTIVE IMPAIRMENTS: Abnormal gait, decreased activity tolerance, decreased balance, decreased  coordination, decreased endurance, decreased knowledge of condition, decreased mobility, difficulty walking, decreased strength, and decreased safety awareness.   ACTIVITY LIMITATIONS: carrying, lifting, stairs, transfers, and locomotion level  PARTICIPATION LIMITATIONS: meal prep, cleaning, laundry, shopping, and community activity  PERSONAL FACTORS: Age, Sex, and 3+ comorbidities: CA, HTN, COPD, DM, depression are also affecting patient's functional outcome.   REHAB POTENTIAL: Fair    CLINICAL DECISION MAKING: Evolving/moderate complexity  EVALUATION COMPLEXITY: Moderate  PLAN:  PT FREQUENCY: 1-2x/week  PT DURATION: 12 weeks  PLANNED INTERVENTIONS: 97164- PT Re-evaluation, 97750- Physical Performance Testing, 97110-Therapeutic exercises, 97530- Therapeutic activity, 97112- Neuromuscular re-education, 97535- Self Care, [id number]- Manual therapy, [phone]- Gait training, [phone]- Canalith repositioning, Patient/Family education, Balance training, Stair training, Joint mobilization, Vestibular training, Cryotherapy, and Moist heat  PLAN FOR NEXT SESSION:  -Assess stair navigation -Continue to provide education on deficits, promote safety awareness & use of AD -concerned about pt ability to safely come to PT here, but will continue to manage over next few sessions -Monitor knee pain (most limiting per pt with standing exercises) -Standing balance as tolerated  Note: Portions of this document were prepared using Dragon voice recognition software and although reviewed may contain unintentional dictation errors in syntax, grammar, or spelling.  Leonor Rode, SPT

## 2024-04-01 ENCOUNTER — Telehealth: Payer: Self-pay | Admitting: *Deleted

## 2024-04-01 ENCOUNTER — Ambulatory Visit: Admitting: *Deleted

## 2024-04-01 VITALS — Ht 62.0 in | Wt 146.0 lb

## 2024-04-01 DIAGNOSIS — Z0000 Encounter for general adult medical examination without abnormal findings: Secondary | ICD-10-CM | POA: Diagnosis not present

## 2024-04-01 NOTE — Telephone Encounter (Signed)
 Performed AWV  While reviewing patient's medications she stated that she has been out of her Plavix  for a while but not sure how long .  Patient was advised that she needs to contact the doctor that prescribed it to her and let him know. Patient stated that she will reach out to the vascular doctor that gave it to her which is Dr. Selinda Gu. Patient was not sure which eye doctor she saw last but will get his name and let us  know so that we can get her last diabetic eye exam office notes. Patient stated that she will get her pneumonia shot at the office at her next visit and she needs a foot exam.

## 2024-04-01 NOTE — Patient Instructions (Signed)
 Terri [name],  Thank you for taking the time for your Medicare Wellness Visit. I appreciate your continued commitment to your health goals. Please review the care plan we discussed, and feel free to reach out if I can assist you further.  Please note that Annual Wellness Visits do not include a physical exam. Some assessments may be limited, especially if the visit was conducted virtually. If needed, we may recommend an in-person follow-up with your provider.  Ongoing Care Seeing your primary care provider every 3 to 6 months helps us  monitor your health and provide consistent, personalized care.  Remember to update your flu, pneumonia, shingles and covid vaccines.  Make sure that you let us  know what eye doctor you saw last so that we can get your last office notes.   Referrals If a referral was made during today's visit and you haven't received any updates within two weeks, please contact the referred provider directly to check on the status.  Recommended Screenings:  Health Maintenance  Topic Date Due   COVID-19 Vaccine (1) Never done   Yearly kidney health urinalysis for diabetes  Never done   Zoster (Shingles) Vaccine (1 of 2) Never done   Pneumococcal Vaccine for age over 46 (2 of 2 - PPSV23, PCV20, or PCV21) 03/25/2017   Eye exam for diabetics  12/20/2023   Complete foot exam   02/25/2024   DTaP/Tdap/Td vaccine (1 - Tdap) 04/27/2024*   Osteoporosis screening with Bone Density Scan  04/27/2024*   Flu Shot  07/28/2024*   Hemoglobin A1C  08/04/2024   Screening for Lung Cancer  08/18/2024   Breast Cancer Screening  09/26/2024   Yearly kidney function blood test for diabetes  02/23/2025   Medicare Annual Wellness Visit  04/01/2025   Colon Cancer Screening  08/04/2025   Hepatitis C Screening  Completed   Meningitis B Vaccine  Aged Out  *Topic was postponed. The date shown is not the original due date.       04/01/2024   11:06 AM  Advanced Directives  Does Patient Have a Medical  Advance Directive? No  Would patient like information on creating a medical advance directive? No - Patient declined    Vision: Annual vision screenings are recommended for early detection of glaucoma, cataracts, and diabetic retinopathy. These exams can also reveal signs of chronic conditions such as diabetes and high blood pressure.  Dental: Annual dental screenings help detect early signs of oral cancer, gum disease, and other conditions linked to overall health, including heart disease and diabetes.  Please see the attached documents for additional preventive care recommendations.

## 2024-04-01 NOTE — Progress Notes (Signed)
 Chief Complaint  Patient presents with   Medicare Wellness     Subjective:   Terri [name] is a 73 y.o. female who presents for a Medicare Annual Wellness Visit.  Visit info / Clinical Intake: Medicare Wellness Visit Type:: Initial Annual Wellness Visit Persons participating in visit and providing information:: patient Medicare Wellness Visit Mode:: Telephone If telephone:: video declined Since this visit was completed virtually, some vitals may be partially provided or unavailable. Missing vitals are due to the limitations of the virtual format.: Unable to obtain vitals - no equipment If Telephone or Video please confirm:: I connected with patient using audio/video enable telemedicine. I verified patient identity with two identifiers, discussed telehealth limitations, and patient agreed to proceed. Patient Location:: Home Provider Location:: Office/Home Interpreter Needed?: No Pre-visit prep was completed: yes AWV questionnaire completed by patient prior to visit?: no Living arrangements:: (!) lives alone Patient's Overall Health Status Rating: (!) fair Typical amount of pain: some Does pain affect daily life?: no Are you currently prescribed opioids?: no  Dietary Habits and Nutritional Risks How many meals a day?: 3 Eats fruit and vegetables daily?: yes Most meals are obtained by: preparing own meals In the last 2 weeks, have you had any of the following?: none Diabetic:: (!) yes Any non-healing wounds?: no How often do you check your BS?: 2 Would you like to be referred to a Nutritionist or for Diabetic Management? : no  Functional Status Activities of Daily Living (to include ambulation/medication): Independent Ambulation: Independent with device- listed below Home Assistive Devices/Equipment: Walker (specify Type) (only outside) Medication Administration: Independent Home Management (perform basic housework or laundry): Independent Manage your own finances?:  yes Primary transportation is: facility / other Concerns about vision?: no *vision screening is required for WTM* Concerns about hearing?: (!) yes (plans on having her hearing checked soon) Uses hearing aids?: no Hear whispered voice?: -- (televisit)  Fall Screening Falls in the past year?: 1 Number of falls in past year: 1 Was there an injury with Fall?: 0 Fall Risk Category Calculator: 2 Patient Fall Risk Level: Moderate Fall Risk  Fall Risk Patient at Risk for Falls Due to: History of fall(s); Impaired balance/gait Fall risk Follow up: Falls evaluation completed; Falls prevention discussed  Home and Transportation Safety: All rugs have non-skid backing?: N/A, no rugs All stairs or steps have railings?: yes Grab bars in the bathtub or shower?: yes Have non-skid surface in bathtub or shower?: (!) no (has a chair that she uses) Good home lighting?: yes Regular seat belt use?: yes Hospital stays in the last year:: no  Cognitive Assessment Difficulty concentrating, remembering, or making decisions? : yes Will 6CIT or Mini Cog be Completed: yes What year is it?: 0 points What month is it?: 0 points Give patient an address phrase to remember (5 components): [address] [place] About what time is it?: 0 points Count backwards from 20 to 1: 0 points Say the months of the year in reverse: 0 points Repeat the address phrase from earlier: 2 points 6 CIT Score: 2 points  Advance Directives (For Healthcare) Does Patient Have a Medical Advance Directive?: No Would patient like information on creating a medical advance directive?: No - Patient declined  Reviewed/Updated  Reviewed/Updated: Reviewed All (Medical, Surgical, Family, Medications, Allergies, Care Teams, Patient Goals)    Allergies (verified) Patient has no known allergies.   Current Medications (verified) Outpatient Encounter Medications as of 04/01/2024  Medication Sig   albuterol  (VENTOLIN  HFA) 108 (90  Base) MCG/ACT inhaler Inhale 2 puffs into the lungs every 6 (six) hours as needed for wheezing or shortness of breath.   amLODipine  (NORVASC ) 10 MG tablet Take 1 tablet (10 mg total) by mouth daily.   aspirin  EC 81 MG tablet Take 81 mg by mouth daily. Swallow whole.   atorvastatin  (LIPITOR) 80 MG tablet Take 1 tablet (80 mg total) by mouth daily.   budesonide -glycopyrrolate-formoterol  (BREZTRI) 160-9-4.8 MCG/ACT AERO inhaler Inhale 2 puffs into the lungs 2 (two) times daily. Via PAP AZ&Me   buPROPion  (WELLBUTRIN  XL) 300 MG 24 hr tablet Take 1 tablet (300 mg total) by mouth daily.   Cholecalciferol  (VITAMIN D3) 50 MCG (2000 UT) capsule Take 2,000 Units by mouth daily.   citalopram  (CELEXA ) 20 MG tablet Take 1 tablet (20 mg total) by mouth daily.   cloNIDine  (CATAPRES ) 0.2 MG tablet Take 1 tablet (0.2 mg total) by mouth 2 (two) times daily.   cyanocobalamin  (VITAMIN B12) 1000 MCG tablet Take 1 tablet (1,000 mcg total) by mouth daily.   insulin  aspart (NOVOLOG ) 100 UNIT/ML injection Inject 5 Units into the skin 3 (three) times daily before meals. (Patient taking differently: Inject 8 Units into the skin 3 (three) times daily before meals.)   insulin  degludec (TRESIBA FLEXTOUCH) 100 UNIT/ML FlexTouch Pen Inject 20 Units into the skin daily. 20 units once daily - REPLACES Levemir    losartan -hydrochlorothiazide  (HYZAAR) 100-25 MG tablet Take 1 tablet by mouth daily.   metoprolol  succinate (TOPROL -XL) 50 MG 24 hr tablet Take 1 tablet (50 mg total) by mouth daily.   montelukast  (SINGULAIR ) 10 MG tablet Take 10 mg by mouth at bedtime.   clopidogrel  (PLAVIX ) 75 MG tablet Take 1 tablet (75 mg total) by mouth daily. (Patient not taking: Reported on 04/01/2024)   Continuous Glucose Sensor (FREESTYLE LIBRE 3 PLUS SENSOR) MISC Change sensor every 15 days. (Patient not taking: Reported on 04/01/2024)   Iron , Ferrous Sulfate , 325 (65 Fe) MG TABS Take 1 tablet by mouth daily. (Patient not taking: Reported on 04/01/2024)    megestrol  (MEGACE ) 40 MG tablet Take 2 tablets (80 mg total) by mouth daily. (Patient not taking: Reported on 04/01/2024)   ondansetron  (ZOFRAN ) 8 MG tablet Take 1 tablet (8 mg total) by mouth every 8 (eight) hours as needed for nausea or vomiting. (Patient not taking: Reported on 04/01/2024)   No facility-administered encounter medications on file as of 04/01/2024.    History: Past Medical History:  Diagnosis Date   Adenomatous colon polyp    Aortic stenosis    Arthritis    Asthma    Cancer (HCC)    Claudication    COPD (chronic obstructive pulmonary disease) (HCC)    Depression    Diabetes mellitus without complication (HCC)    Esophageal dysphagia    GERD (gastroesophageal reflux disease)    Hyperlipemia    Hypertension    Obesity    Severe obesity (BMI 35.0-35.9 with comorbidity) (HCC)    Past Surgical History:  Procedure Laterality Date   BLADDER SURGERY     COLONOSCOPY WITH PROPOFOL  N/A 08/05/2015   Procedure: COLONOSCOPY WITH PROPOFOL ;  Surgeon: Donnice Vaughn Manes, MD;  Location: [facility] ENDOSCOPY;  Service: Endoscopy;  Laterality: N/A;   ESOPHAGOGASTRODUODENOSCOPY N/A 01/09/2022   Procedure: ESOPHAGOGASTRODUODENOSCOPY (EGD);  Surgeon: Maryruth Ole DASEN, MD;  Location: [facility] ENDOSCOPY;  Service: Endoscopy;  Laterality: N/A;   FOOT SURGERY Left    FRACTURE SURGERY     IR CV LINE INJECTION  06/22/2022   IR IMAGING GUIDED  PORT INSERTION  06/05/2022   IR PORT REPAIR CENTRAL VENOUS ACCESS DEVICE  07/02/2022   LOWER EXTREMITY ANGIOGRAPHY Left 02/04/2023   Procedure: Lower Extremity Angiography;  Surgeon: Marea Selinda RAMAN, MD;  Location: [facility] INVASIVE CV LAB;  Service: Cardiovascular;  Laterality: Left;   ORIF ANKLE FRACTURE Left 03/26/2017   Procedure: OPEN REDUCTION INTERNAL FIXATION (ORIF) ANKLE FRACTURE;  Surgeon: Edie Norleen PARAS, MD;  Location: [facility] ORS;  Service: Orthopedics;  Laterality: Left;   Family History  Problem Relation Age of Onset   CVA Mother    Diabetes Mother     CAD Father    Breast cancer Neg Hx    Social History   Occupational History   Not on file  Tobacco Use   Smoking status: Former    Current packs/day: 1.00    Average packs/day: 1 pack/day for 40.0 years (40.0 ttl pk-yrs)    Types: Cigarettes   Smokeless tobacco: Never   Tobacco comments:    Quite 2015  Vaping Use   Vaping status: Never Used  Substance and Sexual Activity   Alcohol use: No   Drug use: No   Sexual activity: Not on file   Tobacco Counseling Counseling given: Not Answered Tobacco comments: Quite 2015  SDOH Screenings   Food Insecurity: No Food Insecurity (04/01/2024)  Housing: Low Risk  (04/01/2024)  Transportation Needs: No Transportation Needs (04/01/2024)  Utilities: Not At Risk (04/01/2024)  Alcohol Screen: Low Risk  (09/20/2022)  Depression (PHQ2-9): Low Risk  (04/01/2024)  Financial Resource Strain: Low Risk  (04/01/2024)  Physical Activity: Inactive (04/01/2024)  Social Connections: Socially Isolated (04/01/2024)  Stress: No Stress Concern Present (04/01/2024)  Tobacco Use: Medium Risk (04/01/2024)  Health Literacy: Adequate Health Literacy (04/01/2024)   See flowsheets for full screening details  Depression Screen PHQ 2 & 9 Depression Scale- Over the past 2 weeks, how often have you been bothered by any of the following problems? Little interest or pleasure in doing things: 0 Feeling down, depressed, or hopeless (PHQ Adolescent also includes...irritable): 0 PHQ-2 Total Score: 0 Trouble falling or staying asleep, or sleeping too much: 0 Feeling tired or having little energy: 0 Poor appetite or overeating (PHQ Adolescent also includes...weight loss): 0 Feeling bad about yourself - or that you are a failure or have let yourself or your family down: 0 Trouble concentrating on things, such as reading the newspaper or watching television (PHQ Adolescent also includes...like school work): 0 Moving or speaking so slowly that other people could have noticed. Or  the opposite - being so fidgety or restless that you have been moving around a lot more than usual: 0 Thoughts that you would be better off dead, or of hurting yourself in some way: 0 PHQ-9 Total Score: 0 If you checked off any problems, how difficult have these problems made it for you to do your work, take care of things at home, or get along with other people?: Not difficult at all     Goals Addressed             This Visit's Progress    Patient Stated       Wants to continue to eat well             Objective:    Today's Vitals   04/01/24 1051  Weight: 146 lb (66.2 kg)  Height: 5' 2 (1.575 m)   Body mass index is 26.7 kg/m.  Hearing/Vision screen Hearing Screening - Comments:: Some issues plans on having hearing  checked soon Vision Screening - Comments:: Readers, patient does not remember his name but will find out and let her PCP know Immunizations and Health Maintenance Health Maintenance  Topic Date Due   COVID-19 Vaccine (1) Never done   Diabetic kidney evaluation - Urine ACR  Never done   Zoster Vaccines- Shingrix (1 of 2) Never done   Pneumococcal Vaccine: 50+ Years (2 of 2 - PPSV23, PCV20, or PCV21) 03/25/2017   OPHTHALMOLOGY EXAM  12/20/2023   FOOT EXAM  02/25/2024   DTaP/Tdap/Td (1 - Tdap) 04/27/2024 (Originally 01/29/1970)   Bone Density Scan  04/27/2024 (Originally 01/30/2015)   Influenza Vaccine  07/28/2024 (Originally 11/29/2023)   HEMOGLOBIN A1C  08/04/2024   Lung Cancer Screening  08/18/2024   Mammogram  09/26/2024   Diabetic kidney evaluation - eGFR measurement  02/23/2025   Medicare Annual Wellness (AWV)  04/01/2025   Colonoscopy  08/04/2025   Hepatitis C Screening  Completed   Meningococcal B Vaccine  Aged Out        Assessment/Plan:  This is a routine wellness examination for Ellia.  Patient Care Team: Gretel App, NP as PCP - General (Nurse Practitioner) Maurie Rayfield BIRCH, RN as Oncology Nurse Navigator Jacobo, Evalene PARAS, MD as  Consulting Physician (Oncology) Kurtis Verdel Adler, MD (Inactive) as Referring Physician (Ophthalmology) [facility] Isadora Hose, MD as Consulting Physician (Pulmonary Disease) Geronimo Manuelita SAUNDERS, [facility] as Pharmacist (Pharmacist)  I have personally reviewed and noted the following in the patient's chart:   Medical and social history Use of alcohol, tobacco or illicit drugs  Current medications and supplements including opioid prescriptions. Functional ability and status Nutritional status Physical activity Advanced directives List of other physicians Hospitalizations, surgeries, and ER visits in previous 12 months Vitals Screenings to include cognitive, depression, and falls Referrals and appointments  No orders of the defined types were placed in this encounter.  In addition, I have reviewed and discussed with patient certain preventive protocols, quality metrics, and best practice recommendations. A written personalized care plan for preventive services as well as general preventive health recommendations were provided to patient.   Angeline Fredericks, LPN   [id number]   Return in 1 year (on 04/01/2025).  After Visit Summary: (MyChart) Due to this being a telephonic visit, the after visit summary with patients personalized plan was offered to patient via MyChart   Nurse Notes: Discussed the need to update flu, pneumonia, covid and shingles vaccines. Patient is to find out what eye doctor she saw last and let her PCP know so that we can get her last office notes.  Patient needs a foot exam and urine ACR at next visit. Patient has been out of her Plavix  for a while she was not sure how long but will reach out to Dr. Marea that prescribed it to her.  Phone note sent to PCP

## 2024-04-02 ENCOUNTER — Other Ambulatory Visit: Payer: Self-pay

## 2024-04-02 ENCOUNTER — Ambulatory Visit

## 2024-04-03 ENCOUNTER — Ambulatory Visit

## 2024-04-03 DIAGNOSIS — R2681 Unsteadiness on feet: Secondary | ICD-10-CM

## 2024-04-03 DIAGNOSIS — Z9181 History of falling: Secondary | ICD-10-CM

## 2024-04-03 DIAGNOSIS — R262 Difficulty in walking, not elsewhere classified: Secondary | ICD-10-CM | POA: Diagnosis not present

## 2024-04-03 DIAGNOSIS — M6281 Muscle weakness (generalized): Secondary | ICD-10-CM

## 2024-04-03 NOTE — Therapy (Signed)
 OUTPATIENT PHYSICAL THERAPY TREATMENT  Patient Name: Terri [name] MRN: [id number] DOB:02-Aug-1950, 73 y.o., female Today's Date: 04/03/2024  PCP: Gretel App, NP  REFERRING PROVIDER: Thomasena Sherran BROCKS, PA-C  END OF SESSION:   PT End of Session - 04/03/24 0840     Visit Number 6    Number of Visits 24    Date for Recertification  05/27/24    Authorization Type Medicare Part A & B    Progress Note Due on Visit 10    PT Start Time [phone]    PT Stop Time 0930    PT Time Calculation (min) 48 min    Equipment Utilized During Treatment Gait belt    Activity Tolerance Patient tolerated treatment well    Behavior During Therapy Restless;Impulsive           Past Medical History:  Diagnosis Date   Adenomatous colon polyp    Aortic stenosis    Arthritis    Asthma    Cancer (HCC)    Claudication    COPD (chronic obstructive pulmonary disease) (HCC)    Depression    Diabetes mellitus without complication (HCC)    Esophageal dysphagia    GERD (gastroesophageal reflux disease)    Hyperlipemia    Hypertension    Obesity    Severe obesity (BMI 35.0-35.9 with comorbidity) (HCC)    Past Surgical History:  Procedure Laterality Date   BLADDER SURGERY     COLONOSCOPY WITH PROPOFOL  N/A 08/05/2015   Procedure: COLONOSCOPY WITH PROPOFOL ;  Surgeon: Donnice Vaughn Manes, MD;  Location: [facility] ENDOSCOPY;  Service: Endoscopy;  Laterality: N/A;   ESOPHAGOGASTRODUODENOSCOPY N/A 01/09/2022   Procedure: ESOPHAGOGASTRODUODENOSCOPY (EGD);  Surgeon: Maryruth Ole DASEN, MD;  Location: [facility] ENDOSCOPY;  Service: Endoscopy;  Laterality: N/A;   FOOT SURGERY Left    FRACTURE SURGERY     IR CV LINE INJECTION  06/22/2022   IR IMAGING GUIDED PORT INSERTION  06/05/2022   IR PORT REPAIR CENTRAL VENOUS ACCESS DEVICE  07/02/2022   LOWER EXTREMITY ANGIOGRAPHY Left 02/04/2023   Procedure: Lower Extremity Angiography;  Surgeon: Marea Selinda RAMAN, MD;  Location: [facility] INVASIVE CV LAB;  Service: Cardiovascular;  Laterality:  Left;   ORIF ANKLE FRACTURE Left 03/26/2017   Procedure: OPEN REDUCTION INTERNAL FIXATION (ORIF) ANKLE FRACTURE;  Surgeon: Edie Norleen PARAS, MD;  Location: [facility] ORS;  Service: Orthopedics;  Laterality: Left;   Patient Active Problem List   Diagnosis Date Noted   Neuropathy 02/04/2024   Decreased hearing of both ears 02/04/2024   Rash 07/01/2023   Muscle strain of chest wall 07/01/2023   Peripheral vascular disease 05/14/2023   Personal history of fall 04/28/2023   COPD exacerbation (HCC) 03/29/2023   Dyspnea on exertion 02/02/2023   Vitamin D  deficiency 02/02/2023   Vitamin B 12 deficiency 02/02/2023   Diabetic eye exam (HCC) 02/02/2023   Aortic atherosclerosis 02/02/2023   Bradycardia 01/14/2023   Anemia 09/28/2022   Postmenopausal bleeding 01/30/2022   Endometrial cancer (HCC) 01/25/2022   Mild aortic stenosis 10/12/2020   Acute respiratory failure with hypoxia (HCC) 08/05/2015   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   Class 2 severe obesity due to excess calories with serious comorbidity in adult 08/19/2013   Obesity, Class II, BMI 35-39.9, with comorbidity 08/19/2013   Chronic obstructive pulmonary disease, unspecified (HCC) 07/31/2012   Asthma with chronic obstructive pulmonary disease (COPD) (HCC) 07/31/2012   Atherosclerosis of native arteries of extremity with intermittent claudication 03/12/2012   Tobacco use 03/12/2012   Hyperlipidemia associated with type 2  diabetes mellitus (HCC) 06/06/2011   Hypertension associated with diabetes (HCC) 06/06/2011   Mood disorder 06/06/2011   Type 2 diabetes with complication (HCC) 06/06/2011    ONSET DATE: it started when I was a kid - 2003 for imbalance  REFERRING DIAG:  R26.89 (ICD-10-CM) - Imbalance  G62.9 (ICD-10-CM) - Neuropathy   THERAPY DIAG:  Difficulty in walking, not elsewhere classified  Muscle weakness (generalized)  History of falling  Unsteadiness on feet  Rationale for Evaluation and Treatment:  Rehabilitation  SUBJECTIVE:                                                                                                                                                                                             SUBJECTIVE STATEMENT:   Pt states she did not sleep well the previous night, and on arrival was asleep in transport chair. She states she has been having some pain on her left side after a fall a few months ago.    PERTINENT HISTORY:   Of note, pt also undergoing treatment for progressive endometrial cancer with cervical and vaginal involvement.   Per neurology note 01/20/24: Patient with PMH significant for hypertension, hyperlipidemia, diabetes, endometrial cancer (currently undergoing treatment). Patient presents for follow-up with ongoing imbalance and weakness. She did not start PT following last office visit given transportation issues. She is no longer taking gabapentin, however she is taking Lyrica 150 mg twice daily per external provider. Patient has previously been diagnosed with chronic severe sensorimotor polyneuropathy as well as advanced lumbar spine degeneration. Again offered referral to physiatry, patient defers at this time Again discussed referral to outpatient physical therapy for gait and balance training, however patient wishes to hold off at this time. Continue Lyrica 150 mg twice daily per external provider.    PAIN:  Are you having pain? No pain at rest, but stops walking/standing several times in session due to Left knee pain (non during STS exercises)   PRECAUTIONS: Fall and Other: pt reporting having port for chemo  WEIGHT BEARING RESTRICTIONS: No  FALLS: Has patient fallen in last 6 months? Pt unsure of when last fall was, estimates to be about last year. Pt notes that she did slip the other day where she knocked her bookcase over; pt reports no injuries.   LIVING ENVIRONMENT: Lives with: lives alone Lives in: House/apartment - town  house Stairs: Yes: Internal: ~12 steps; on right going up and External: 3 steps; on right going up Has following equipment at home: Counselling psychologist, Walker - 2 wheeled, shower chair, and Grab bars  PLOF: Independent with basic ADLs  PATIENT GOALS:  walk normally   OBJECTIVE:  Note: Objective measures were completed at Evaluation unless otherwise noted.  DIAGNOSTIC FINDINGS:  Per neurology note 01/20/24: 10/08/2023 EMG IMPRESSION: There is electrodiagnostic evidence of severe sensorimotor polyneuropathy in the lower extremities.  09/06/2023 CT LUMBAR SPINE WO IMPRESSION:  1. No acute osseous abnormality in the Lumbar Spine. Chronic L1  compression fracture, lower thoracic hyperostosis related interbody  ankylosis including at T12-L1.  2. Advanced lumbar spine degeneration L3-L4 through L5-S1. Moderate  multifactorial spinal stenosis suspected at L3-L4, Mild at L4-L5.  Moderate bilateral L3, moderate to severe bilateral L4, and very  severe bilateral L5 nerve level foraminal stenoses.  3. Aortic Atherosclerosis (ICD10-I70.0).   06/05/2023 CT HEAD AND C SPINE WO CONTRAST IMPRESSION:  1. No acute intracranial pathology. Small-vessel white matter  disease.  2. Soft tissue contusion and hematoma of the right forehead.  3. No fracture or static subluxation of the cervical spine.  4. Moderate multilevel cervical disc degenerative disease.   12/19/2022 CT HEAD AND C SPINE WO CONTRAST IMPRESSION:  1. No acute intracranial abnormality.  2. Right orbital floor fracture with herniation of the inferior  rectus muscle body through the fracture site. Findings can be seen  in the setting of entrapment. Recommend ophthalmologic consultation.  3. Air in the intraconal compartment of the orbit abutting the right  optic nerve. No evidence of an intraorbital hematoma.  4. No acute fracture or traumatic subluxation of the cervical spine.  5. Subcutaneous emphysema that extends inferiorly  to the level of  the thyroid  and superiorly to the level of the periorbital soft  tissues. This may be secondary to an additional occult maxillary  sinus injury, possibly along the anterior wall.    COGNITION: Overall cognitive status: pt reporting difficulty with memory following recent chemo; unable to provide clear timeline of recent falls/stumbles   GAIT: Findings: Gait Characteristics: decreased stride length and narrow BOS, Distance walked: distance required for assessment, Assistive device utilized:Walker - 2 wheeled, and Level of assistance: CGA and Min A  FUNCTIONAL TESTS:  5 times sit to stand: 16.57 with RW for steadying, decreased eccentric control Timed up and go (TUG): to be assessed 3 minute walk test: to be assessed 10 meter walk test:  0.24 with RW, CGA-minA for posterior/R lateral LOBs   PATIENT SURVEYS:  ABC scale 11/5: 46%                                                                                                                             TREATMENT DATE 04/03/24:   TA- To improve functional movements patterns for everyday tasks   Transfer with CGA to nustep from transport chair  Nustep SPM setting for visual feedback for speed, frequent reminder to keep pace x 5 min performed in bouts of 2 minutes with 1 minute rest Level 1  -Pt abruptly stopped at 5 minutes stating she was too fatigued to continue  Gait to chair - CGA, with  RW performed between seated activities 3 x ~56ft   Gait around clinic with RW, navigating obstacles including chairs, desks, and machines 2 x ~ 64ft  -Seated rest provided between sets  TE- To improve strength, endurance, mobility, and function of specific targeted muscle groups or improve joint range of motion or improve muscle flexibility   Seated march with 2# AW 2 x 10- pt requires verbal, tactile, and visual cues to maintain knee flexion height.   Seated heel raises 2# AW - 3 x 12 - cues and visual demo for B movement and full  ROM.   Seated toes raise  3 x 12 reps, similar cues   NMR: To facilitate reeducation of movement, balance, posture, coordination, and/or proprioception/kinesthetic sense.   Standing narrow stance balance on firm surface 2 x 30 sec attempts- pt demonstrates increased sway, one lateral LOB, and frequent UE support   Unless otherwise stated, CGA was provided and gait belt donned in order to ensure pt safety   Pt required occasional rest breaks due fatigue, PT was attentive to when pt appeared to be tired or winded in order to prevent excessive fatigue.   PATIENT EDUCATION: Education details: Heavy cues throughout, instruction that we will avoid knee pain and exacerbations as much as we are able.  Person educated: Patient Education method: Explanation and Verbal cues Education comprehension: verbalized understanding and needs further education  HOME EXERCISE PROGRAM: Access Code: [email or web address] URL: [email or web address] Date: 03/06/2024 Prepared by: Lonni Gainer  Exercises - Sit to stand with counter or walker support in front of you  - 1 x daily - 7 x weekly - 2 sets - 6 reps - Seated March  - 1 x daily - 7 x weekly - 2 sets - 10 reps - Seated Long Arc Quad  - 1 x daily - 7 x weekly - 2 sets - 10 reps  GOALS: Goals reviewed with patient? Yes  SHORT TERM GOALS: Target date: 04/15/24  Patient will be independent with home exercise program to improve strength/mobility for increased functional independence with ADLs and mobility. Baseline: to be assessed Goal status: INITIAL   LONG TERM GOALS: Target date: 05/27/24  Patient (> 81 years old) will complete five times sit to stand test (5XSTS) in < 15 seconds indicating an increased LE strength and improved balance. Baseline: 16.57 seconds BUE support at walker upon standing, decreased eccentric control Goal status: INITIAL  2.  Patient will increase Berg Balance score by > 6 points to demonstrate decreased fall risk  during functional activities. Baseline: 25/56 Goal status: INITIAL  3.  Patient will increase 3 minute walk test distance to >338ft for progression to community level ambulation, demonstrating improved gait endurance. Baseline: Completed 2:17 min; 165ft Goal status: INITIAL  4.  Patient will increase 10 meter walk test to >0.6 m/s as to improve gait speed for better community ambulation and to reduce fall risk. Baseline: 0.24 m/s with RW, CGA-minA for posterior/R lateral LOBs Goal status: INITIAL  5.  Patient will reduce timed up and go to <15 seconds to reduce fall risk and demonstrate improved transfer/gait ability. Baseline: 20.77sec Goal status: INITIAL  6.  Patient will improve ABC scale score  by >20% to demonstrate increased confidence with functional mobility and ADLs Baseline: 46.25% Goal status: INITIAL  ASSESSMENT:  CLINICAL IMPRESSION: Pt arrived with good motivation for completion of PT activities. Pt began on nustep at decreased resistance, attempted three minutes but pt only able to tolerate 2 minutes before requiring a  rest. Pt unable to complete 6 minutes and abruptly stopped at 5 min stating fatigue. All seated exercises require heavy verbal, tactile and visual cueing. Overall, this session focused on increased gait tolerance. Pt did not state any knee pain at this session, but would frequently stop activity relative to fatigue. Despite fatigue pt did well navigating obstacles in the clinic for short bouts of gait. Throughout session pt was encouraged to complete HEP and was provided a new print out of previous exercises. End of session, pt was provided new end caps for RW for safer and quiter ambulation. Patient will benefit from skilled physical therapy intervention to reduce deficits and impairments identified in evaluation, in order to reduce pain, improve quality of life, and maximize activity tolerance for ADL, IADL, and leisure/fitness. Physical therapy will help pt  achieve long and short term goals of care.      OBJECTIVE IMPAIRMENTS: Abnormal gait, decreased activity tolerance, decreased balance, decreased coordination, decreased endurance, decreased knowledge of condition, decreased mobility, difficulty walking, decreased strength, and decreased safety awareness.   ACTIVITY LIMITATIONS: carrying, lifting, stairs, transfers, and locomotion level  PARTICIPATION LIMITATIONS: meal prep, cleaning, laundry, shopping, and community activity  PERSONAL FACTORS: Age, Sex, and 3+ comorbidities: CA, HTN, COPD, DM, depression are also affecting patient's functional outcome.   REHAB POTENTIAL: Fair    CLINICAL DECISION MAKING: Evolving/moderate complexity  EVALUATION COMPLEXITY: Moderate  PLAN:  PT FREQUENCY: 1-2x/week  PT DURATION: 12 weeks  PLANNED INTERVENTIONS: [id number]- PT Re-evaluation, 97750- Physical Performance Testing, 97110-Therapeutic exercises, 97530- Therapeutic activity, 97112- Neuromuscular re-education, 97535- Self Care, [id number]- Manual therapy, [phone]- Gait training, [phone]- Canalith repositioning, Patient/Family education, Balance training, Stair training, Joint mobilization, Vestibular training, Cryotherapy, and Moist heat  PLAN FOR NEXT SESSION:  -Assess stair navigation -Continue to provide education on deficits, promote safety awareness & use of AD -concerned about pt ability to safely come to PT here, but will continue to manage over next few sessions -Monitor knee pain (most limiting per pt with standing exercises) -Standing balance as tolerated  Note: Portions of this document were prepared using Dragon voice recognition software and although reviewed may contain unintentional dictation errors in syntax, grammar, or spelling.  Leonor Rode, SPT    I have reviewed and concur with this student's documentation.   Reyes LOISE London, PT 04/03/2024 10:06 AM

## 2024-04-06 ENCOUNTER — Inpatient Hospital Stay: Attending: Obstetrics and Gynecology

## 2024-04-06 ENCOUNTER — Encounter: Payer: Self-pay | Admitting: Oncology

## 2024-04-06 ENCOUNTER — Inpatient Hospital Stay

## 2024-04-06 ENCOUNTER — Inpatient Hospital Stay: Admitting: Oncology

## 2024-04-06 VITALS — BP 98/66 | HR 57 | Temp 97.0°F | Resp 18 | Ht 62.0 in | Wt 149.0 lb

## 2024-04-06 DIAGNOSIS — D649 Anemia, unspecified: Secondary | ICD-10-CM | POA: Diagnosis not present

## 2024-04-06 DIAGNOSIS — N289 Disorder of kidney and ureter, unspecified: Secondary | ICD-10-CM | POA: Insufficient documentation

## 2024-04-06 DIAGNOSIS — C541 Malignant neoplasm of endometrium: Secondary | ICD-10-CM

## 2024-04-06 DIAGNOSIS — E1165 Type 2 diabetes mellitus with hyperglycemia: Secondary | ICD-10-CM | POA: Insufficient documentation

## 2024-04-06 DIAGNOSIS — R531 Weakness: Secondary | ICD-10-CM | POA: Diagnosis not present

## 2024-04-06 DIAGNOSIS — Z9221 Personal history of antineoplastic chemotherapy: Secondary | ICD-10-CM | POA: Insufficient documentation

## 2024-04-06 DIAGNOSIS — Z87891 Personal history of nicotine dependence: Secondary | ICD-10-CM | POA: Insufficient documentation

## 2024-04-06 DIAGNOSIS — R4781 Slurred speech: Secondary | ICD-10-CM

## 2024-04-06 DIAGNOSIS — E1129 Type 2 diabetes mellitus with other diabetic kidney complication: Secondary | ICD-10-CM | POA: Diagnosis not present

## 2024-04-06 LAB — CMP (CANCER CENTER ONLY)
ALT: 11 U/L (ref 0–44)
AST: 14 U/L — ABNORMAL LOW (ref 15–41)
Albumin: 3.5 g/dL (ref 3.5–5.0)
Alkaline Phosphatase: 82 U/L (ref 38–126)
Anion gap: 10 (ref 5–15)
BUN: 25 mg/dL — ABNORMAL HIGH (ref 8–23)
CO2: 27 mmol/L (ref 22–32)
Calcium: 9.2 mg/dL (ref 8.9–10.3)
Chloride: 101 mmol/L (ref 98–111)
Creatinine: 1.08 mg/dL — ABNORMAL HIGH (ref 0.44–1.00)
GFR, Estimated: 54 mL/min — ABNORMAL LOW (ref >60)
Glucose, Bld: 206 mg/dL — ABNORMAL HIGH (ref 70–99)
Potassium: 3.8 mmol/L (ref 3.5–5.1)
Sodium: 138 mmol/L (ref 135–145)
Total Bilirubin: 0.4 mg/dL (ref 0.0–1.2)
Total Protein: 5.6 g/dL — ABNORMAL LOW (ref 6.5–8.1)

## 2024-04-06 LAB — CBC WITH DIFFERENTIAL (CANCER CENTER ONLY)
Abs Immature Granulocytes: 0.01 K/uL (ref 0.00–0.07)
Basophils Absolute: 0 K/uL (ref 0.0–0.1)
Basophils Relative: 0 %
Eosinophils Absolute: 0.1 K/uL (ref 0.0–0.5)
Eosinophils Relative: 2 %
HCT: 33 % — ABNORMAL LOW (ref 36.0–46.0)
Hemoglobin: 11 g/dL — ABNORMAL LOW (ref 12.0–15.0)
Immature Granulocytes: 0 %
Lymphocytes Relative: 8 %
Lymphs Abs: 0.4 K/uL — ABNORMAL LOW (ref 0.7–4.0)
MCH: 28.4 pg (ref 26.0–34.0)
MCHC: 33.3 g/dL (ref 30.0–36.0)
MCV: 85.3 fL (ref 80.0–100.0)
Monocytes Absolute: 0.5 K/uL (ref 0.1–1.0)
Monocytes Relative: 9 %
Neutro Abs: 4.1 K/uL (ref 1.7–7.7)
Neutrophils Relative %: 81 %
Platelet Count: 179 K/uL (ref 150–400)
RBC: 3.87 MIL/uL (ref 3.87–5.11)
RDW: 13.7 % (ref 11.5–15.5)
WBC Count: 5.1 K/uL (ref 4.0–10.5)
nRBC: 0 % (ref 0.0–0.2)

## 2024-04-06 NOTE — Progress Notes (Signed)
 Patient said that she fell over a month ago, and since she has been having some pain on her right side near ribcage under her right breast. She has been feeling more sleepy.

## 2024-04-06 NOTE — Progress Notes (Signed)
 [facility]  [phone] [phone]  ID: Terri [name] OB: 1950-12-19  MR#: [id number]  RDW#:[id number]  Patient Care Team: Gretel App, NP as PCP - General (Nurse Practitioner) Maurie Rayfield BIRCH, RN as Oncology Nurse Navigator Jacobo, Evalene PARAS, MD as Consulting Physician (Oncology) Kurtis Verdel Adler, MD (Inactive) as Referring Physician (Ophthalmology) [facility] Isadora Hose, MD as Consulting Physician (Pulmonary Disease) Geronimo Manuelita SAUNDERS, [facility] as Pharmacist (Pharmacist) Clarisa Therisa RIGGERS as Physician Assistant  CHIEF COMPLAINT: 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  INTERVAL HISTORY: Patient returns to clinic today for repeat laboratory and further evaluation.  She reports she is tolerating Megace  well without significant side effects.  Approximately 3 weeks ago patient developed symptoms that appear to be related to a possible CVA, but did not seek any medical help.  She continues to have a mild aphasia but her right-sided weakness appears to have resolved.  She continues to have chronic weakness and fatigue.  She has no other neurologic complaints.  She does not complain of vaginal bleeding today.  She denies any recent fevers or illnesses.  She has no chest pain, shortness of breath, cough, or hemoptysis.  She denies any nausea, vomiting, constipation, or diarrhea.  She has no urinary complaints.  Patient offers no further specific complaints today.  REVIEW OF SYSTEMS:   Review of Systems  Constitutional:  Positive for malaise/fatigue. Negative for fever and weight loss.  Respiratory: Negative.  Negative for cough, hemoptysis and shortness of breath.   Cardiovascular: Negative.  Negative for chest pain and leg swelling.  Gastrointestinal: Negative.  Negative for abdominal pain, blood in stool and melena.  Genitourinary: Negative.  Negative for dysuria.  Musculoskeletal: Negative.   Negative for back pain and falls.  Skin: Negative.  Negative for rash.  Neurological:  Positive for sensory change, speech change and weakness. Negative for dizziness, focal weakness and headaches.  Psychiatric/Behavioral: Negative.  The patient is not nervous/anxious.     As per HPI. Otherwise, a complete review of systems is negative.  PAST MEDICAL HISTORY: Past Medical History:  Diagnosis Date   Adenomatous colon polyp    Aortic stenosis    Arthritis    Asthma    Cancer (HCC)    Claudication    COPD (chronic obstructive pulmonary disease) (HCC)    Depression    Diabetes mellitus without complication (HCC)    Esophageal dysphagia    GERD (gastroesophageal reflux disease)    Hyperlipemia    Hypertension    Obesity    Severe obesity (BMI 35.0-35.9 with comorbidity) (HCC)     PAST SURGICAL HISTORY: Past Surgical History:  Procedure Laterality Date   BLADDER SURGERY     COLONOSCOPY WITH PROPOFOL  N/A 08/05/2015   Procedure: COLONOSCOPY WITH PROPOFOL ;  Surgeon: Donnice Vaughn Manes, MD;  Location: [facility] ENDOSCOPY;  Service: Endoscopy;  Laterality: N/A;   ESOPHAGOGASTRODUODENOSCOPY N/A 01/09/2022   Procedure: ESOPHAGOGASTRODUODENOSCOPY (EGD);  Surgeon: Maryruth Ole DASEN, MD;  Location: [facility] ENDOSCOPY;  Service: Endoscopy;  Laterality: N/A;   FOOT SURGERY Left    FRACTURE SURGERY     IR CV LINE INJECTION  06/22/2022   IR IMAGING GUIDED PORT INSERTION  06/05/2022   IR PORT REPAIR CENTRAL VENOUS ACCESS DEVICE  07/02/2022   LOWER EXTREMITY ANGIOGRAPHY Left 02/04/2023   Procedure: Lower Extremity Angiography;  Surgeon: Marea Selinda RAMAN, MD;  Location: [facility] INVASIVE CV LAB;  Service: Cardiovascular;  Laterality: Left;   ORIF ANKLE FRACTURE Left 03/26/2017  Procedure: OPEN REDUCTION INTERNAL FIXATION (ORIF) ANKLE FRACTURE;  Surgeon: Edie Norleen PARAS, MD;  Location: [facility] ORS;  Service: Orthopedics;  Laterality: Left;    FAMILY HISTORY: Family History  Problem Relation Age of Onset   CVA  Mother    Diabetes Mother    CAD Father    Breast cancer Neg Hx     ADVANCED DIRECTIVES (Y/N):  N  HEALTH MAINTENANCE: Social History   Tobacco Use   Smoking status: Former    Current packs/day: 1.00    Average packs/day: 1 pack/day for 40.0 years (40.0 ttl pk-yrs)    Types: Cigarettes   Smokeless tobacco: Never   Tobacco comments:    Quite 2015  Vaping Use   Vaping status: Never Used  Substance Use Topics   Alcohol use: No   Drug use: No     Colonoscopy:  PAP:  Bone density:  Lipid panel:  No Known Allergies  Current Outpatient Medications  Medication Sig Dispense Refill   albuterol  (VENTOLIN  HFA) 108 (90 Base) MCG/ACT inhaler Inhale 2 puffs into the lungs every 6 (six) hours as needed for wheezing or shortness of breath. 1 each 1   amLODipine  (NORVASC ) 10 MG tablet Take 1 tablet (10 mg total) by mouth daily. 90 tablet 3   aspirin  EC 81 MG tablet Take 81 mg by mouth daily. Swallow whole.     atorvastatin  (LIPITOR) 80 MG tablet Take 1 tablet (80 mg total) by mouth daily. 90 tablet 3   budesonide -glycopyrrolate-formoterol  (BREZTRI) 160-9-4.8 MCG/ACT AERO inhaler Inhale 2 puffs into the lungs 2 (two) times daily. Via PAP AZ&Me     buPROPion  (WELLBUTRIN  XL) 300 MG 24 hr tablet Take 1 tablet (300 mg total) by mouth daily. 90 tablet 0   Cholecalciferol  (VITAMIN D3) 50 MCG (2000 UT) capsule Take 2,000 Units by mouth daily.     citalopram  (CELEXA ) 20 MG tablet Take 1 tablet (20 mg total) by mouth daily. 90 tablet 3   cloNIDine  (CATAPRES ) 0.2 MG tablet Take 1 tablet (0.2 mg total) by mouth 2 (two) times daily. 180 tablet 3   cyanocobalamin  (VITAMIN B12) 1000 MCG tablet Take 1 tablet (1,000 mcg total) by mouth daily. 90 tablet 3   insulin  degludec (TRESIBA FLEXTOUCH) 100 UNIT/ML FlexTouch Pen Inject 20 Units into the skin daily. 20 units once daily - REPLACES Levemir      losartan -hydrochlorothiazide  (HYZAAR) 100-25 MG tablet Take 1 tablet by mouth daily. 90 tablet 3   metoprolol   succinate (TOPROL -XL) 50 MG 24 hr tablet Take 1 tablet (50 mg total) by mouth daily. 90 tablet 0   montelukast  (SINGULAIR ) 10 MG tablet Take 10 mg by mouth at bedtime.     clopidogrel  (PLAVIX ) 75 MG tablet Take 1 tablet (75 mg total) by mouth daily. (Patient not taking: Reported on 04/06/2024) 90 tablet 3   Continuous Glucose Sensor (FREESTYLE LIBRE 3 PLUS SENSOR) MISC Change sensor every 15 days. (Patient not taking: Reported on 04/06/2024) 6 each 3   insulin  aspart (NOVOLOG ) 100 UNIT/ML injection Inject 5 Units into the skin 3 (three) times daily before meals. (Patient not taking: Reported on 04/06/2024)     Iron , Ferrous Sulfate , 325 (65 Fe) MG TABS Take 1 tablet by mouth daily. (Patient not taking: Reported on 04/06/2024) 90 tablet 3   megestrol  (MEGACE ) 40 MG tablet Take 2 tablets (80 mg total) by mouth daily. (Patient not taking: Reported on 04/06/2024) 60 tablet 2   ondansetron  (ZOFRAN ) 8 MG tablet Take 1 tablet (8 mg  total) by mouth every 8 (eight) hours as needed for nausea or vomiting. (Patient not taking: Reported on 04/06/2024) 30 tablet 0   No current facility-administered medications for this visit.    OBJECTIVE: Vitals:   04/06/24 1100  BP: 98/66  Pulse: (!) 57  Resp: 18  Temp: (!) 97 F (36.1 C)  SpO2: 100%       Body mass index is 27.25 kg/m.    ECOG FS:2 - Symptomatic, <50% confined to bed  General: Well-developed, well-nourished, no acute distress.  Sitting in a wheelchair. Eyes: Pink conjunctiva, anicteric sclera. HEENT: Normocephalic, moist mucous membranes. Lungs: No audible wheezing or coughing. Heart: Regular rate and rhythm. Abdomen: Soft, nontender, no obvious distention. Musculoskeletal: No edema, cyanosis, or clubbing. Neuro: Alert, answering all questions appropriately. Cranial nerves grossly intact. Skin: No rashes or petechiae noted. Psych: Normal affect.  LAB RESULTS:  Lab Results  Component Value Date   NA 138 04/06/2024   K 3.8 04/06/2024   CL  101 04/06/2024   CO2 27 04/06/2024   GLUCOSE 206 (H) 04/06/2024   BUN 25 (H) 04/06/2024   CREATININE 1.08 (H) 04/06/2024   CALCIUM  9.2 04/06/2024   PROT 5.6 (L) 04/06/2024   ALBUMIN 3.5 04/06/2024   AST 14 (L) 04/06/2024   ALT 11 04/06/2024   ALKPHOS 82 04/06/2024   BILITOT 0.4 04/06/2024   GFRNONAA 54 (L) 04/06/2024   GFRAA >60 03/27/2017    Lab Results  Component Value Date   WBC 5.1 04/06/2024   NEUTROABS 4.1 04/06/2024   HGB 11.0 (L) 04/06/2024   HCT 33.0 (L) 04/06/2024   MCV 85.3 04/06/2024   PLT 179 04/06/2024     STUDIES: No results found.   ONCOLOGY HISTORY: Patient was initially hesitant to undergo chemotherapy and elected to do XRT only which was completed on April 18, 2022.  CT scan results from October 08, 2022 reviewed independently with no obvious evidence of progressive disease. She completed 6 cycles of carboplatinum, Taxol , and Keytruda  on October 03, 2022.  Patient then initiated maintenance Keytruda  on October 23, 2022.  PET scan results from January 15, 2023 reviewed independently with mild nonspecific residual hypermetabolism in the lower uterine segment as well as small bilateral common iliac nodes possibly suggesting nodal metastasis.  Patient was seen by gynecology oncology who determined surgical intervention is not an option.  They recommend discontinuing Keytruda  for progressive disease and initiating single agent Doxil  every 28 days.  Patient received 3 cycles of Doxil  before progression of disease her last dose was given on June 05, 2023.  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  ER 70%, PR 30%.  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  Repeat PET scan on October 15, 2023 reviewed independently with significant improvement of disease burden.  Internal exam by gynecology oncology at that time also confirmed disease improvement.  Patient  subsequently underwent 3 additional cycles of single agent Taxol  completing on January 01, 2024.  Repeat PET scan on January 31, 2024 reviewed independently with stable/possibly slight progression of disease.  No further chemotherapy is planned at this time.  Patient was initiated on 80 mg Megace  daily and is tolerating it well therefore we will increase her dose to 80 mg twice per day.  No further intervention is needed.  Return to clinic in 4 weeks for further evaluation.  Consider repeat PET scan in January/February 2026.   Possible CVA: Symptoms appeared  3 weeks ago.  Will get MRI of the brain for further evaluation. Port: Port revision successful.  Continue with port flushes every 6 weeks. Hyperglycemia: Patient continues to have poor blood glucose control with level greater than 200.  Continue monitoring and treatment per primary care. Renal insufficiency: Mild, monitor. Hypokalemia: Resolved. Anemia: Chronic and unchanged.  Patient's hemoglobin is 11.0. Thrombocytopenia: Resolved. Vaginal bleeding: Improved.  Continue follow-up with gynecology oncology as above.  Claudication symptoms: Resolved.  Patient underwent revascularization procedure on February 04, 2023.  Unclear if patient has been back to see vascular surgery. Right hamstring tendon rupture: Patient reports there is no plan for surgical repair. Rash: Resolved.  Likely secondary to Doxil  which has been discontinued.   Poor appetite/weight loss: Improved. Peripheral neuropathy: Patient reports this has improved.  Continue follow-up with neurology as recommended.   Falls: Denies any falls recently.  Patient now has 2 walkers at her home upstairs and downstairs.  She admits to only using them occasionally.  Patient expressed understanding and was in agreement with this plan. She also understands that She can call clinic at any time with any questions, concerns, or complaints.    Cancer Staging  Endometrial cancer [facility]) Staging form:  Corpus Uteri - Carcinoma and Carcinosarcoma, AJCC 8th Edition - Clinical stage from 05/30/2022: FIGO Stage IIIB (cT3b, cN0, cM0) - Signed by Jacobo Evalene PARAS, MD on 05/30/2022 Stage prefix: Initial diagnosis   Evalene PARAS Jacobo, MD   04/06/2024 3:27 PM

## 2024-04-07 ENCOUNTER — Ambulatory Visit

## 2024-04-08 ENCOUNTER — Ambulatory Visit: Admitting: Physical Therapy

## 2024-04-09 ENCOUNTER — Ambulatory Visit

## 2024-04-09 ENCOUNTER — Encounter: Payer: Self-pay | Admitting: Oncology

## 2024-04-10 ENCOUNTER — Ambulatory Visit: Admitting: Physical Therapy

## 2024-04-13 ENCOUNTER — Observation Stay
Admission: EM | Admit: 2024-04-13 | Discharge: 2024-04-14 | Disposition: A | Attending: Internal Medicine | Admitting: Internal Medicine

## 2024-04-13 ENCOUNTER — Ambulatory Visit: Admission: RE | Admit: 2024-04-13 | Discharge: 2024-04-13 | Attending: Oncology | Admitting: Oncology

## 2024-04-13 ENCOUNTER — Other Ambulatory Visit: Payer: Self-pay

## 2024-04-13 ENCOUNTER — Inpatient Hospital Stay

## 2024-04-13 ENCOUNTER — Telehealth: Payer: Self-pay | Admitting: *Deleted

## 2024-04-13 DIAGNOSIS — R531 Weakness: Secondary | ICD-10-CM | POA: Insufficient documentation

## 2024-04-13 DIAGNOSIS — J439 Emphysema, unspecified: Secondary | ICD-10-CM

## 2024-04-13 DIAGNOSIS — F39 Unspecified mood [affective] disorder: Secondary | ICD-10-CM

## 2024-04-13 DIAGNOSIS — E1159 Type 2 diabetes mellitus with other circulatory complications: Secondary | ICD-10-CM

## 2024-04-13 DIAGNOSIS — Z79899 Other long term (current) drug therapy: Secondary | ICD-10-CM | POA: Diagnosis not present

## 2024-04-13 DIAGNOSIS — N1831 Chronic kidney disease, stage 3a: Secondary | ICD-10-CM | POA: Diagnosis not present

## 2024-04-13 DIAGNOSIS — R4701 Aphasia: Secondary | ICD-10-CM | POA: Insufficient documentation

## 2024-04-13 DIAGNOSIS — Y92009 Unspecified place in unspecified non-institutional (private) residence as the place of occurrence of the external cause: Secondary | ICD-10-CM

## 2024-04-13 DIAGNOSIS — Z9181 History of falling: Secondary | ICD-10-CM | POA: Diagnosis not present

## 2024-04-13 DIAGNOSIS — Z7982 Long term (current) use of aspirin: Secondary | ICD-10-CM | POA: Diagnosis not present

## 2024-04-13 DIAGNOSIS — Z8542 Personal history of malignant neoplasm of other parts of uterus: Secondary | ICD-10-CM | POA: Diagnosis not present

## 2024-04-13 DIAGNOSIS — I639 Cerebral infarction, unspecified: Principal | ICD-10-CM | POA: Diagnosis present

## 2024-04-13 DIAGNOSIS — J449 Chronic obstructive pulmonary disease, unspecified: Secondary | ICD-10-CM | POA: Diagnosis present

## 2024-04-13 DIAGNOSIS — R2681 Unsteadiness on feet: Secondary | ICD-10-CM

## 2024-04-13 DIAGNOSIS — F32A Depression, unspecified: Secondary | ICD-10-CM | POA: Diagnosis present

## 2024-04-13 DIAGNOSIS — Z6826 Body mass index (BMI) 26.0-26.9, adult: Secondary | ICD-10-CM | POA: Diagnosis not present

## 2024-04-13 DIAGNOSIS — D329 Benign neoplasm of meninges, unspecified: Secondary | ICD-10-CM | POA: Diagnosis present

## 2024-04-13 DIAGNOSIS — E1122 Type 2 diabetes mellitus with diabetic chronic kidney disease: Secondary | ICD-10-CM | POA: Diagnosis not present

## 2024-04-13 DIAGNOSIS — E1129 Type 2 diabetes mellitus with other diabetic kidney complication: Secondary | ICD-10-CM | POA: Diagnosis present

## 2024-04-13 DIAGNOSIS — J4489 Other specified chronic obstructive pulmonary disease: Secondary | ICD-10-CM | POA: Diagnosis not present

## 2024-04-13 DIAGNOSIS — R9389 Abnormal findings on diagnostic imaging of other specified body structures: Secondary | ICD-10-CM | POA: Diagnosis present

## 2024-04-13 DIAGNOSIS — E785 Hyperlipidemia, unspecified: Secondary | ICD-10-CM

## 2024-04-13 DIAGNOSIS — I63511 Cerebral infarction due to unspecified occlusion or stenosis of right middle cerebral artery: Principal | ICD-10-CM | POA: Insufficient documentation

## 2024-04-13 DIAGNOSIS — E1169 Type 2 diabetes mellitus with other specified complication: Secondary | ICD-10-CM | POA: Diagnosis present

## 2024-04-13 DIAGNOSIS — E663 Overweight: Secondary | ICD-10-CM | POA: Diagnosis present

## 2024-04-13 DIAGNOSIS — R4781 Slurred speech: Secondary | ICD-10-CM

## 2024-04-13 DIAGNOSIS — I152 Hypertension secondary to endocrine disorders: Secondary | ICD-10-CM | POA: Diagnosis present

## 2024-04-13 DIAGNOSIS — C541 Malignant neoplasm of endometrium: Secondary | ICD-10-CM | POA: Diagnosis present

## 2024-04-13 DIAGNOSIS — E538 Deficiency of other specified B group vitamins: Secondary | ICD-10-CM

## 2024-04-13 DIAGNOSIS — W19XXXA Unspecified fall, initial encounter: Secondary | ICD-10-CM

## 2024-04-13 DIAGNOSIS — Z87891 Personal history of nicotine dependence: Secondary | ICD-10-CM | POA: Diagnosis not present

## 2024-04-13 LAB — DIFFERENTIAL
Abs Immature Granulocytes: 0.02 K/uL (ref 0.00–0.07)
Basophils Absolute: 0 K/uL (ref 0.0–0.1)
Basophils Relative: 0 %
Eosinophils Absolute: 0.1 K/uL (ref 0.0–0.5)
Eosinophils Relative: 3 %
Immature Granulocytes: 0 %
Lymphocytes Relative: 8 %
Lymphs Abs: 0.4 K/uL — ABNORMAL LOW (ref 0.7–4.0)
Monocytes Absolute: 0.3 K/uL (ref 0.1–1.0)
Monocytes Relative: 6 %
Neutro Abs: 4.7 K/uL (ref 1.7–7.7)
Neutrophils Relative %: 83 %

## 2024-04-13 LAB — COMPREHENSIVE METABOLIC PANEL WITH GFR
ALT: 11 U/L (ref 0–44)
AST: 15 U/L (ref 15–41)
Albumin: 3.9 g/dL (ref 3.5–5.0)
Alkaline Phosphatase: 110 U/L (ref 38–126)
Anion gap: 10 (ref 5–15)
BUN: 20 mg/dL (ref 8–23)
CO2: 27 mmol/L (ref 22–32)
Calcium: 9.3 mg/dL (ref 8.9–10.3)
Chloride: 101 mmol/L (ref 98–111)
Creatinine, Ser: 1.04 mg/dL — ABNORMAL HIGH (ref 0.44–1.00)
GFR, Estimated: 56 mL/min — ABNORMAL LOW (ref >60)
Glucose, Bld: 299 mg/dL — ABNORMAL HIGH (ref 70–99)
Potassium: 3.8 mmol/L (ref 3.5–5.1)
Sodium: 139 mmol/L (ref 135–145)
Total Bilirubin: 0.3 mg/dL (ref 0.0–1.2)
Total Protein: 6.2 g/dL — ABNORMAL LOW (ref 6.5–8.1)

## 2024-04-13 LAB — CBC
HCT: 37.6 % (ref 36.0–46.0)
Hemoglobin: 12.3 g/dL (ref 12.0–15.0)
MCH: 28.4 pg (ref 26.0–34.0)
MCHC: 32.7 g/dL (ref 30.0–36.0)
MCV: 86.8 fL (ref 80.0–100.0)
Platelets: 190 K/uL (ref 150–400)
RBC: 4.33 MIL/uL (ref 3.87–5.11)
RDW: 14.1 % (ref 11.5–15.5)
WBC: 5.6 K/uL (ref 4.0–10.5)
nRBC: 0 % (ref 0.0–0.2)

## 2024-04-13 LAB — ETHANOL: Alcohol, Ethyl (B): <15 mg/dL (ref <15)

## 2024-04-13 LAB — APTT: aPTT: 24 s (ref 24–36)

## 2024-04-13 LAB — PROTIME-INR
INR: 1 (ref 0.8–1.2)
Prothrombin Time: 13.6 s (ref 11.4–15.2)

## 2024-04-13 LAB — GLUCOSE, CAPILLARY
Glucose-Capillary: 206 mg/dL — ABNORMAL HIGH (ref 70–99)
Glucose-Capillary: 252 mg/dL — ABNORMAL HIGH (ref 70–99)

## 2024-04-13 MED ORDER — HYDRALAZINE HCL 20 MG/ML IJ SOLN: 5.0000 mg | INTRAMUSCULAR | Status: DC | PRN

## 2024-04-13 MED ORDER — INSULIN ASPART 100 UNIT/ML IJ SOLN
0.0000 [IU] | Freq: Three times a day (TID) | INTRAMUSCULAR | Status: DC
  Administered 2024-04-14 (×2): 5 [IU] via SUBCUTANEOUS
  Filled 2024-04-13 (×2): qty 5

## 2024-04-13 MED ORDER — LOSARTAN POTASSIUM 50 MG PO TABS
100.0000 mg | ORAL_TABLET | Freq: Every day | ORAL | Status: DC
  Administered 2024-04-14 (×2): 100 mg via ORAL
  Filled 2024-04-13 (×2): qty 2

## 2024-04-13 MED ORDER — GADOBUTROL 1 MMOL/ML IV SOLN
7.0000 mL | Freq: Once | INTRAVENOUS | Status: AC | PRN
  Administered 2024-04-13: 11:00:00 7 mL via INTRAVENOUS

## 2024-04-13 MED ORDER — SENNOSIDES-DOCUSATE SODIUM 8.6-50 MG PO TABS: 1.0000 | ORAL_TABLET | Freq: Every evening | ORAL | Status: DC | PRN

## 2024-04-13 MED ORDER — CITALOPRAM HYDROBROMIDE 20 MG PO TABS
20.0000 mg | ORAL_TABLET | Freq: Every day | ORAL | Status: DC
  Administered 2024-04-14 (×2): 20 mg via ORAL
  Filled 2024-04-13 (×2): qty 1

## 2024-04-13 MED ORDER — ONDANSETRON HCL 4 MG/2ML IJ SOLN: 4.0000 mg | Freq: Three times a day (TID) | INTRAMUSCULAR | Status: DC | PRN

## 2024-04-13 MED ORDER — ACETAMINOPHEN 160 MG/5ML PO SOLN: 650.0000 mg | ORAL | Status: DC | PRN

## 2024-04-13 MED ORDER — LOSARTAN POTASSIUM-HCTZ 100-25 MG PO TABS: 1.0000 | ORAL_TABLET | Freq: Every day | ORAL | Status: DC

## 2024-04-13 MED ORDER — CLOPIDOGREL BISULFATE 75 MG PO TABS: 75.0000 mg | ORAL_TABLET | Freq: Every day | ORAL | Status: DC

## 2024-04-13 MED ORDER — ACETAMINOPHEN 325 MG PO TABS: 650.0000 mg | ORAL_TABLET | ORAL | Status: DC | PRN

## 2024-04-13 MED ORDER — VITAMIN D 25 MCG (1000 UNIT) PO TABS
2000.0000 [IU] | ORAL_TABLET | Freq: Every day | ORAL | Status: DC
  Administered 2024-04-13 – 2024-04-14 (×2): 2000 [IU] via ORAL
  Filled 2024-04-13 (×2): qty 2

## 2024-04-13 MED ORDER — STROKE: EARLY STAGES OF RECOVERY BOOK: Freq: Once | Status: AC

## 2024-04-13 MED ORDER — SODIUM CHLORIDE 0.9 % IV SOLN: INTRAVENOUS | Status: DC

## 2024-04-13 MED ORDER — INSULIN DEGLUDEC 100 UNIT/ML ~~LOC~~ SOPN: 20.0000 [IU] | PEN_INJECTOR | Freq: Every day | SUBCUTANEOUS | Status: DC

## 2024-04-13 MED ORDER — AMLODIPINE BESYLATE 10 MG PO TABS
10.0000 mg | ORAL_TABLET | Freq: Every day | ORAL | Status: DC
  Administered 2024-04-14 (×2): 10 mg via ORAL
  Filled 2024-04-13 (×2): qty 1

## 2024-04-13 MED ORDER — DM-GUAIFENESIN ER 30-600 MG PO TB12: 1.0000 | ORAL_TABLET | Freq: Two times a day (BID) | ORAL | Status: DC | PRN

## 2024-04-13 MED ORDER — INSULIN GLARGINE 100 UNIT/ML ~~LOC~~ SOLN
20.0000 [IU] | Freq: Every day | SUBCUTANEOUS | Status: DC
  Administered 2024-04-14: 11:00:00 20 [IU] via SUBCUTANEOUS
  Filled 2024-04-13: qty 0.2

## 2024-04-13 MED ORDER — METOPROLOL SUCCINATE ER 50 MG PO TB24
50.0000 mg | ORAL_TABLET | Freq: Every day | ORAL | Status: DC
  Administered 2024-04-14 (×2): 50 mg via ORAL
  Filled 2024-04-13 (×2): qty 1

## 2024-04-13 MED ORDER — INSULIN ASPART 100 UNIT/ML IJ SOLN
0.0000 [IU] | Freq: Every day | INTRAMUSCULAR | Status: DC
  Administered 2024-04-13: 2 [IU] via SUBCUTANEOUS
  Filled 2024-04-13: qty 3

## 2024-04-13 MED ORDER — BUDESON-GLYCOPYRROL-FORMOTEROL 160-9-4.8 MCG/ACT IN AERO
2.0000 | INHALATION_SPRAY | Freq: Two times a day (BID) | RESPIRATORY_TRACT | Status: DC
  Administered 2024-04-14 (×2): 2 via RESPIRATORY_TRACT
  Filled 2024-04-13: qty 5.9

## 2024-04-13 MED ORDER — BUPROPION HCL ER (XL) 300 MG PO TB24
300.0000 mg | ORAL_TABLET | Freq: Every day | ORAL | Status: DC
  Administered 2024-04-14 (×2): 300 mg via ORAL
  Filled 2024-04-13 (×2): qty 1

## 2024-04-13 MED ORDER — CLONIDINE HCL 0.1 MG PO TABS
0.2000 mg | ORAL_TABLET | Freq: Two times a day (BID) | ORAL | Status: DC
  Administered 2024-04-14 (×2): 0.2 mg via ORAL
  Filled 2024-04-13 (×2): qty 2

## 2024-04-13 MED ORDER — ACETAMINOPHEN 650 MG RE SUPP: 650.0000 mg | RECTAL | Status: DC | PRN

## 2024-04-13 MED ORDER — SODIUM CHLORIDE 0.9% FLUSH: 3.0000 mL | Freq: Once | INTRAVENOUS | Status: DC

## 2024-04-13 MED ORDER — HYDROCHLOROTHIAZIDE 25 MG PO TABS
25.0000 mg | ORAL_TABLET | Freq: Every day | ORAL | Status: DC
  Administered 2024-04-14 (×2): 25 mg via ORAL
  Filled 2024-04-13 (×2): qty 1

## 2024-04-13 MED ORDER — ATORVASTATIN CALCIUM 20 MG PO TABS
80.0000 mg | ORAL_TABLET | Freq: Every day | ORAL | Status: DC
  Administered 2024-04-13 – 2024-04-14 (×2): 80 mg via ORAL
  Filled 2024-04-13 (×2): qty 4

## 2024-04-13 MED ORDER — ALBUTEROL SULFATE (2.5 MG/3ML) 0.083% IN NEBU: 2.5000 mg | INHALATION_SOLUTION | RESPIRATORY_TRACT | Status: DC | PRN

## 2024-04-13 MED ORDER — VITAMIN B-12 1000 MCG PO TABS
1000.0000 ug | ORAL_TABLET | Freq: Every day | ORAL | Status: DC
  Administered 2024-04-14 (×2): 1000 ug via ORAL
  Filled 2024-04-13 (×2): qty 1

## 2024-04-13 MED ORDER — ASPIRIN 81 MG PO TBEC
81.0000 mg | DELAYED_RELEASE_TABLET | Freq: Every day | ORAL | Status: DC
  Administered 2024-04-14 (×2): 81 mg via ORAL
  Filled 2024-04-13 (×2): qty 1

## 2024-04-13 NOTE — Telephone Encounter (Signed)
 RN placed call to patient to advise on MRI results, MD recommendations. Dr. Jacobo received call report from radiology regarding critical results of MRI Brain.   IMPRESSION: 1. 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 Mild cytotoxic edema, and also faint post-ischemic enhancement, with no hemorrhagic transformation or mass effect. 2. No metastatic disease identified. 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 3. Underlying moderately advanced chronic small vessel disease.  Patient advised that is MD recommendation to be evaluated in the ED given ongoing symptoms and progressive weakness with MRI results. Patient reports progressive weakness, unable to turn the door to get into her house earlier today. RN advised patient be transported by EMS for ED evaluation for intervention. Patient reports she will call her friend Mliss to see if she can take her.   RN spoke with Powell, Charge RN in ED to advise on patient coming into ED for evaluation.

## 2024-04-13 NOTE — ED Notes (Signed)
 See triage note, pt reports was told to come to ER for possible stroke from MRI results. Pt states has had trouble opening door with right hand over the past month, denies all other sx. Alert and oriented.

## 2024-04-13 NOTE — H&P (Signed)
 History and Physical    Terri [name] FMW:[id number] DOB: Oct 21, 1950 DOA: 04/13/2024  Referring MD/NP/PA:   PCP: Gretel App, NP   Patient coming from:  The patient is coming from home.     Chief Complaint: Multiple falls, poor balance, difficulty speaking, right hand weakness, and abnormal MRI of brain with stroke  HPI: Terri [name] is a 73 y.o. female with medical history significant of HTN, HLD, DM, COPD, asthma, PVD, depression, CKD-3A, aortic stenosis, esophageal dysphagia, endometrial cancer (s/p of chemotherapy and radiation therapy, no surgery per pt), who presents with Multiple falls, poor balance, difficulty speaking, right hand weakness, and abnormal MRI of brain with stroke.  Pt states that she has poor balance and multiple falls in the past 4 weeks.  She had right sided weakness recently which has resolved.  She also has mild difficulty speaking.  She reports right handed weakness described as difficulty opening right hand.  No vision loss or hearing loss.  No facial droop.  Currently denies numbness or tingling in extremities.  Patient does not have chest pain, cough, SOB.  No nausea, vomiting, diarrhea or abdominal pain.  Denies symptoms of UTI.  She states that she has intermittent vaginal bleeding due to endometrial cancer.  Pt was seen by her oncologist, Dr. Jacobo on 12/8, who ordered an MRI for brain which resulted today, showing stroke.  Patient is sent to ED for further evaluation and treatment.  MRI-brain: 1. 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 Mild cytotoxic edema, and also faint post-ischemic enhancement, with no hemorrhagic transformation or mass effect. 2. No metastatic disease identified. 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 3. Underlying moderately advanced chronic small vessel disease.   Data reviewed independently and ED Course:  pt was found to have WBC 5.6, stable renal function, temperature normal, blood pressure 91/78, heart rate 65, RR 18.  Patient is placed in [place] for observation.   EKG: I have personally reviewed.  Sinus rhythm, QTc 477, bifascicular block, poor R wave progression.   Review of Systems:   General: no fevers, chills, no body weight gain, has fatigue HEENT: no blurry vision, hearing changes or sore throat Respiratory: no dyspnea, coughing, wheezing CV: no chest pain, no palpitations GI: no nausea, vomiting, abdominal pain, diarrhea, constipation GU: no dysuria, burning on urination, increased urinary frequency, hematuria.  Has intermittent vaginal bleeding Ext: no leg edema Neuro: no unilateral numbness, or tingling, no vision change or hearing loss. Has poor balance, right hand weakness and multiple falls Skin: no rash, no skin tear. MSK: No muscle spasm, no deformity, no limitation of range of movement in spin Heme: No easy bruising.  Travel history: No recent long distant travel.   Allergy: Allergies[1]  Past Medical History:  Diagnosis Date   Adenomatous colon polyp    Aortic stenosis    Arthritis    Asthma    Cancer (HCC)    Claudication    COPD (chronic obstructive pulmonary disease) (HCC)    Depression    Diabetes mellitus without complication (HCC)    Esophageal dysphagia    GERD (gastroesophageal reflux disease)    Hyperlipemia    Hypertension    Obesity    Severe obesity (BMI 35.0-35.9 with comorbidity) (HCC)     Past Surgical History:  Procedure Laterality Date   BLADDER SURGERY     COLONOSCOPY WITH PROPOFOL  N/A 08/05/2015   Procedure: COLONOSCOPY WITH PROPOFOL ;  Surgeon:  Donnice Vaughn Manes, MD;  Location: [facility] ENDOSCOPY;  Service: Endoscopy;  Laterality: N/A;   ESOPHAGOGASTRODUODENOSCOPY N/A 01/09/2022   Procedure: ESOPHAGOGASTRODUODENOSCOPY (EGD);  Surgeon: Maryruth Ole DASEN, MD;  Location: [facility] ENDOSCOPY;  Service: Endoscopy;  Laterality: N/A;   FOOT  SURGERY Left    FRACTURE SURGERY     IR CV LINE INJECTION  06/22/2022   IR IMAGING GUIDED PORT INSERTION  06/05/2022   IR PORT REPAIR CENTRAL VENOUS ACCESS DEVICE  07/02/2022   LOWER EXTREMITY ANGIOGRAPHY Left 02/04/2023   Procedure: Lower Extremity Angiography;  Surgeon: Marea Selinda RAMAN, MD;  Location: [facility] INVASIVE CV LAB;  Service: Cardiovascular;  Laterality: Left;   ORIF ANKLE FRACTURE Left 03/26/2017   Procedure: OPEN REDUCTION INTERNAL FIXATION (ORIF) ANKLE FRACTURE;  Surgeon: Edie Norleen PARAS, MD;  Location: [facility] ORS;  Service: Orthopedics;  Laterality: Left;    Social History:  reports that she has quit smoking. Her smoking use included cigarettes. She has a 40 pack-year smoking history. She has never used smokeless tobacco. She reports that she does not drink alcohol and does not use drugs.  Family History:  Family History  Problem Relation Age of Onset   CVA Mother    Diabetes Mother    CAD Father    Breast cancer Neg Hx      Prior to Admission medications  Medication Sig Start Date End Date Taking? Authorizing Provider  albuterol  (VENTOLIN  HFA) 108 (90 Base) MCG/ACT inhaler Inhale 2 puffs into the lungs every 6 (six) hours as needed for wheezing or shortness of breath. 03/30/23   Jhonny Calvin NOVAK, MD  amLODipine  (NORVASC ) 10 MG tablet Take 1 tablet (10 mg total) by mouth daily. 01/03/24   Gretel App, NP  aspirin  EC 81 MG tablet Take 81 mg by mouth daily. Swallow whole.    [provider]  atorvastatin  (LIPITOR) 80 MG tablet Take 1 tablet (80 mg total) by mouth daily. 11/04/23   Gretel App, NP  budesonide -glycopyrrolate -formoterol  (BREZTRI ) 160-9-4.8 MCG/ACT AERO inhaler Inhale 2 puffs into the lungs 2 (two) times daily. Via PAP AZ&Me    [provider]  buPROPion  (WELLBUTRIN  XL) 300 MG 24 hr tablet Take 1 tablet (300 mg total) by mouth daily. 12/16/23   Gretel App, NP  Cholecalciferol  (VITAMIN D3) 50 MCG (2000 UT) capsule Take 2,000 Units by mouth daily.     [provider]  citalopram  (CELEXA ) 20 MG tablet Take 1 tablet (20 mg total) by mouth daily. 01/03/24   Gretel App, NP  cloNIDine  (CATAPRES ) 0.2 MG tablet Take 1 tablet (0.2 mg total) by mouth 2 (two) times daily. 01/07/24   Gretel App, NP  clopidogrel  (PLAVIX ) 75 MG tablet Take 1 tablet (75 mg total) by mouth daily. Patient not taking: Reported on 04/06/2024 02/28/23   Marea Selinda RAMAN, MD  Continuous Glucose Sensor (FREESTYLE LIBRE 3 PLUS SENSOR) MISC Change sensor every 15 days. Patient not taking: Reported on 04/06/2024 02/04/24   Gretel App, NP  cyanocobalamin  (VITAMIN B12) 1000 MCG tablet Take 1 tablet (1,000 mcg total) by mouth daily. 02/02/23   Hope Merle, MD  insulin  aspart (NOVOLOG ) 100 UNIT/ML injection Inject 5 Units into the skin 3 (three) times daily before meals. Patient not taking: Reported on 04/06/2024    [provider]  insulin  degludec (TRESIBA  FLEXTOUCH) 100 UNIT/ML FlexTouch Pen Inject 20 Units into the skin daily. 20 units once daily - REPLACES Levemir     [provider]  Iron , Ferrous Sulfate , 325 (65 Fe) MG TABS Take  1 tablet by mouth daily. Patient not taking: Reported on 04/06/2024 11/04/23   Gretel App, NP  losartan -hydrochlorothiazide  (HYZAAR) 100-25 MG tablet Take 1 tablet by mouth daily. 01/03/24   Gretel App, NP  megestrol  (MEGACE ) 40 MG tablet Take 2 tablets (80 mg total) by mouth daily. Patient not taking: Reported on 04/06/2024 02/24/24   Jacobo Evalene PARAS, MD  metoprolol  succinate (TOPROL -XL) 50 MG 24 hr tablet Take 1 tablet (50 mg total) by mouth daily. 12/16/23   Gretel App, NP  montelukast  (SINGULAIR ) 10 MG tablet Take 10 mg by mouth at bedtime.    [provider]  ondansetron  (ZOFRAN ) 8 MG tablet Take 1 tablet (8 mg total) by mouth every 8 (eight) hours as needed for nausea or vomiting. Patient not taking: Reported on 04/06/2024 04/10/23   Jacobo Evalene PARAS, MD    Physical Exam: Vitals:   04/13/24 1939 04/13/24 2019  04/13/24 2103 04/13/24 2359  BP: (!) 199/68  (!) 199/68 (!) 174/76  Pulse: 61  61 66  Resp: 16  16 16   Temp: 98.4 F (36.9 C)  98.4 F (36.9 C) 98.4 F (36.9 C)  TempSrc: Oral  Oral Oral  SpO2: 98%     Weight:   66.3 kg   Height:  5' 2 (1.575 m) 5' 2 (1.575 m)    General: Not in acute distress HEENT:       Eyes: PERRL, EOMI, no jaundice       ENT: No discharge from the ears and nose, no pharynx injection, no tonsillar enlargement.        Neck: No JVD, no bruit, no mass felt. Heme: No neck lymph node enlargement. Cardiac: S1/S2, RRR, No murmurs, No gallops or rubs. Respiratory: No rales, wheezing, rhonchi or rubs. GI: Soft, nondistended, nontender, no rebound pain, no organomegaly, BS present. GU: No hematuria Ext: No pitting leg edema bilaterally. 1+DP/PT pulse bilaterally. Musculoskeletal: No joint deformities, No joint redness or warmth, no limitation of ROM in spin. Skin: No rashes.  Neuro: Alert, oriented X3, cranial nerves II-XII grossly intact, moves all extremities normally. Muscle strength 5/5 in all extremities, sensation to light touch intact.  Psych: Patient is not psychotic, no suicidal or hemocidal ideation.  Labs on Admission: I have personally reviewed following labs and imaging studies  CBC: Recent Labs  Lab 04/13/24 1505  WBC 5.6  NEUTROABS 4.7  HGB 12.3  HCT 37.6  MCV 86.8  PLT 190   Basic Metabolic Panel: Recent Labs  Lab 04/13/24 1505  NA 139  K 3.8  CL 101  CO2 27  GLUCOSE 299*  BUN 20  CREATININE 1.04*  CALCIUM  9.3   GFR: Estimated Creatinine Clearance: 42.4 mL/min (A) (by C-G formula based on SCr of 1.04 mg/dL (H)). Liver Function Tests: Recent Labs  Lab 04/13/24 1505  AST 15  ALT 11  ALKPHOS 110  BILITOT 0.3  PROT 6.2*  ALBUMIN 3.9   No results for input(s): LIPASE, AMYLASE in the last 168 hours. No results for input(s): AMMONIA in the last 168 hours. Coagulation Profile: Recent Labs  Lab 04/13/24 1505  INR 1.0    Cardiac Enzymes: No results for input(s): CKTOTAL, CKMB, CKMBINDEX, TROPONINI in the last 168 hours. BNP (last 3 results) No results for input(s): PROBNP in the last 8760 hours. HbA1C: No results for input(s): HGBA1C in the last 72 hours. CBG: Recent Labs  Lab 04/13/24 2117 04/13/24 2353  GLUCAP 252* 206*   Lipid Profile: No results for input(s): CHOL, HDL,  LDLCALC, TRIG, CHOLHDL, LDLDIRECT in the last 72 hours. Thyroid  Function Tests: No results for input(s): TSH, T4TOTAL, FREET4, T3FREE, THYROIDAB in the last 72 hours. Anemia Panel: No results for input(s): VITAMINB12, FOLATE, FERRITIN, TIBC, IRON , RETICCTPCT in the last 72 hours. Urine analysis: No results found for: COLORURINE, APPEARANCEUR, LABSPEC, PHURINE, GLUCOSEU, HGBUR, BILIRUBINUR, KETONESUR, PROTEINUR, UROBILINOGEN, NITRITE, LEUKOCYTESUR Sepsis Labs: @LABRCNTIP (procalcitonin:4,lacticidven:4) )No results found for this or any previous visit (from the past 240 hours).   Radiological Exams on Admission:   Assessment/Plan Principal Problem:   Stroke [facility]) Active Problems:   Fall at home, initial encounter   Hypertension associated with diabetes (HCC)   Hyperlipidemia associated with type 2 diabetes mellitus (HCC)   Type II diabetes mellitus with renal manifestations (HCC)   Chronic kidney disease, stage 3a (HCC)   Chronic obstructive pulmonary disease, unspecified (HCC)   Meningioma (HCC)   Endometrial cancer (HCC)   Depression   Overweight (BMI 25.0-29.9)   Assessment and Plan:  Stroke [facility]): as shown by out pt MRI-brain.  No significant deficit on examination today.  Patient is taking aspirin  81 mg daily.  She used to be on Plavix  which was discontinued likely due to vaginal bleeding.  She still has vaginal bleeding, therefore will not start Plavix  today.  -Placed on tele bed for observation - CTA of  head and neck - pt is out of  window for permissive hypertension - Continue aspirin  81 mg daily.  Will not increase dose of aspirin  due to vaginal bleeding   Statin: Continue Lipitor 80 mg daily - fasting lipid panel and HbA1c  - 2D transthoracic echocardiography  - swallowing screen. If fails, will get SLP - PT/OT consult  Fall at home, initial encounter: Likely due to stroke -PT/OT - Fall precaution  Hypertension associated with diabetes (HCC) -IV hydralazine  as needed - Amlodipine , clonidine , Hyzaar, metoprolol   Hyperlipidemia associated with type 2 diabetes mellitus (HCC) -Lipitor  Type II diabetes mellitus with renal manifestations (HCC): Recent A1c 6.2, well-controlled.  Patient taking NovoLog  and Tresiba  20 units daily.  Blood sugar 299 today. -SSI - Glargine insulin  20 units daily  Chronic kidney disease, stage 3a [facility]): Renal function stable.  Baseline creatinine 1.08 on 04/06/2024.  Her creatinine is 1.04, BUN 20, GFR 56. - Follow-up with BMP  Chronic obstructive pulmonary disease, unspecified (HCC): Stable -Bronchodilators and as needed Mucinex   Meningioma (HCC): This is incidental findings by MRI of the brain. -Follow-up with PCP as outpatient follow-up  Endometrial cancer [facility]): S/p of chemotherapy and radiation therapy per patient. -Following up with Dr. Jacobo of oncology  Depression -Wellbutrin  and Celexa   Overweight (BMI 25.0-29.9): Body weight 66.3 kg, BMI 26.71 - Encourage losing weight - Exercise healthy diet      DVT ppx: SCD  Code Status: Full code  Family Communication:     not done, no family member is at bed side.       Disposition Plan:  Anticipate discharge back to previous environment  Consults called:  none  Admission status and Level of care: Telemetry   for obs     Dispo: The patient is from: Home              Anticipated d/c is to: Home              Anticipated d/c date is: 1 day              Patient currently is not medically stable to  d/c.    Severity of  Illness:  The appropriate patient status for this patient is OBSERVATION. Observation status is judged to be reasonable and necessary in order to provide the required intensity of service to ensure the patient's safety. The patient's presenting symptoms, physical exam findings, and initial radiographic and laboratory data in the context of their medical condition is felt to place them at decreased risk for further clinical deterioration. Furthermore, it is anticipated that the patient will be medically stable for discharge from the hospital within 2 midnights of admission.        Date of Service 04/14/2024    Caleb Exon [facility]   If 7PM-7AM, please contact night-coverage [email or web address] 04/14/2024, 1:07 AM     [1] No Known Allergies

## 2024-04-13 NOTE — ED Triage Notes (Signed)
 Pt comes with c/o abnormal MRI. MRI showed pt had stroke and symptoms had started few days ago.

## 2024-04-13 NOTE — ED Provider Notes (Signed)
 [facility] Provider Note    Event Date/Time   First MD Initiated Contact with Patient 04/13/24 1654     (approximate)   History   Abnormal MRI   HPI  Terri [name] is a 73 y.o. female past medical history significant for endometrial cancer with cervical and vaginal involvement, hyperglycemia, renal insufficiency, anemia, thrombocytopenia, peripheral neuropathy, who presents to the emergency department with abnormal MRI results.  Patient states that over the past month or so she has had multiple falls.  States that she was falling frequently, falling inside and outside.  Was feeling dizzy and off balance.  States that she went to her oncologist appointment and had an MRI done and was called today and told that she had a stroke.  States that she was having some pain to her left chest wall after one of the falls but states that it has not been improving.  Feels like her speech has improved.  Feels like she is having less weakness than she was previously.  No known prior history of stroke.  On chart review approximately 3 weeks ago started belting symptoms that were concerning to be related to a possible CVA and did not seek any medical attention at that time.  Continues to have some mild aphasia, was having right sided weakness that has had some improvement.  Had an MRI ordered from her oncologist as an outpatient that resulted today.  MRI resulted today as acute and subacute on chronic patchy infarcts to the posterior right MCA territory with some mild edema.  No hemorrhagic transformation or mass effect.  No metastatic disease identified.  Questionable small meningioma.     Physical Exam   Triage Vital Signs: ED Triage Vitals  Encounter Vitals Group     BP 04/13/24 1503 (!) 184/64     Girls Systolic BP Percentile --      Girls Diastolic BP Percentile --      Boys Systolic BP Percentile --      Boys Diastolic BP Percentile --      Pulse Rate 04/13/24 1503 71      Resp 04/13/24 1503 18     Temp 04/13/24 1503 98 F (36.7 C)     Temp src --      SpO2 04/13/24 1503 97 %     Weight 04/13/24 1502 146 lb (66.2 kg)     Height 04/13/24 1502 5' 2 (1.575 m)     Head Circumference --      Peak Flow --      Pain Score 04/13/24 1502 0     Pain Loc --      Pain Education --      Exclude from Growth Chart --     Most recent vital signs: Vitals:   04/13/24 1503 04/13/24 1706  BP: (!) 184/64 91/78  Pulse: 71 65  Resp: 18 18  Temp: 98 F (36.7 C)   SpO2: 97% 99%    Physical Exam Constitutional:      Appearance: She is well-developed.  HENT:     Head: Atraumatic.  Eyes:     Extraocular Movements: Extraocular movements intact.     Pupils: Pupils are equal, round, and reactive to light.     Comments: Left eye with hemorrhagic conjunctiva.  Pupils are equal and reactive.  Cardiovascular:     Rate and Rhythm: Regular rhythm.     Pulses: Normal pulses.  Pulmonary:     Effort: No respiratory distress.  Abdominal:     General: There is no distension.     Tenderness: There is no abdominal tenderness.  Musculoskeletal:        General: Normal range of motion.     Cervical back: Normal range of motion.     Right lower leg: No edema.     Left lower leg: No edema.  Skin:    General: Skin is warm.     Capillary Refill: Capillary refill takes less than 2 seconds.  Neurological:     Mental Status: She is alert. Mental status is at baseline.     GCS: GCS eye subscore is 4. GCS verbal subscore is 5. GCS motor subscore is 6.     Cranial Nerves: Cranial nerves 2-12 are intact.     Sensory: Sensation is intact.     Motor: Motor function is intact.     IMPRESSION / MDM / ASSESSMENT AND PLAN / ED COURSE  I reviewed the triage vital signs and the nursing notes.  Differential diagnosis including CVA, electrolyte abnormality, dehydration  Reviewed MRI results that was done earlier today with acute and subacute infarct to the MCA territory.  Patient is  outside of the window for TNK.  Outside of the window for thrombectomy given symptoms occurred more than 1 month ago.  EKG  I, Clotilda Punter, the attending physician, personally viewed and interpreted this ECG.  EKG showed right bundle branch block.  No significant ST elevation or depression.  No significant change when compared to prior.  No findings of acute ischemia or dysrhythmia.  No tachycardic or bradycardic dysrhythmias while on cardiac telemetry.  RADIOLOGY  MRI brain with and without contrast with acute and subacute on chronic patchy infarcts to the right posterior right MCA territory.  No metastatic disease noted.  LABS (all labs ordered are listed, but only abnormal results are displayed) Labs interpreted as -    Labs Reviewed  DIFFERENTIAL - Abnormal; Notable for the following components:      Result Value   Lymphs Abs 0.4 (*)    All other components within normal limits  COMPREHENSIVE METABOLIC PANEL WITH GFR - Abnormal; Notable for the following components:   Glucose, Bld 299 (*)    Creatinine, Ser 1.04 (*)    Total Protein 6.2 (*)    GFR, Estimated 56 (*)    All other components within normal limits  PROTIME-INR  APTT  CBC  ETHANOL  CBG MONITORING, ED     MDM  Patient's clinical picture concerning for acute and subacute infarcts.  Has been having more frequent falls and gait instability.  Patient is outside of the window for TNK or thrombectomy.  No signs of metastatic disease.  No significant leukocytosis or anemia.  Creatinine appears to be at her baseline.  Hyperglycemia but no findings concerning for DKA or hyperosmolar syndrome.  Alcohol level negative.  EKG without findings of acute ischemia  Consulted hospitalist for admission for stroke workup.     PROCEDURES:  Critical Care performed: No  Procedures  Patient's presentation is most consistent with acute presentation with potential threat to life or bodily function.   MEDICATIONS ORDERED  IN ED: Medications  sodium chloride  flush (NS) 0.9 % injection 3 mL ( Intravenous Canceled Entry 04/13/24 1552)    FINAL CLINICAL IMPRESSION(S) / ED DIAGNOSES   Final diagnoses:  Acute CVA (cerebrovascular accident) (HCC)  Gait instability     Rx / DC Orders   ED Discharge Orders  None        Note:  This document was prepared using Dragon voice recognition software and may include unintentional dictation errors.   Suzanne Kirsch, MD 04/13/24 [phone]

## 2024-04-14 ENCOUNTER — Ambulatory Visit

## 2024-04-14 ENCOUNTER — Observation Stay

## 2024-04-14 ENCOUNTER — Other Ambulatory Visit: Payer: Self-pay

## 2024-04-14 DIAGNOSIS — I10 Essential (primary) hypertension: Secondary | ICD-10-CM | POA: Diagnosis not present

## 2024-04-14 DIAGNOSIS — E119 Type 2 diabetes mellitus without complications: Secondary | ICD-10-CM | POA: Diagnosis not present

## 2024-04-14 DIAGNOSIS — I63231 Cerebral infarction due to unspecified occlusion or stenosis of right carotid arteries: Secondary | ICD-10-CM

## 2024-04-14 DIAGNOSIS — E785 Hyperlipidemia, unspecified: Secondary | ICD-10-CM | POA: Diagnosis not present

## 2024-04-14 LAB — HEMOGLOBIN A1C
Hgb A1c MFr Bld: 8 % — ABNORMAL HIGH (ref 4.8–5.6)
Mean Plasma Glucose: 182.9 mg/dL

## 2024-04-14 LAB — GLUCOSE, CAPILLARY
Glucose-Capillary: 298 mg/dL — ABNORMAL HIGH (ref 70–99)
Glucose-Capillary: 258 mg/dL — ABNORMAL HIGH (ref 70–99)

## 2024-04-14 LAB — LIPID PANEL
Cholesterol: 140 mg/dL (ref 0–200)
HDL: 37 mg/dL — ABNORMAL LOW (ref >40)
LDL Cholesterol: 89 mg/dL (ref 0–99)
Total CHOL/HDL Ratio: 3.8 ratio
Triglycerides: 69 mg/dL (ref <150)
VLDL: 14 mg/dL (ref 0–40)

## 2024-04-14 MED ORDER — IOHEXOL 350 MG/ML SOLN
75.0000 mL | Freq: Once | INTRAVENOUS | Status: AC | PRN
  Administered 2024-04-14: 02:00:00 75 mL via INTRAVENOUS

## 2024-04-14 MED ORDER — GLUCERNA SHAKE PO LIQD: 237.0000 mL | Freq: Three times a day (TID) | ORAL | Status: DC

## 2024-04-14 MED ORDER — CLOPIDOGREL BISULFATE 75 MG PO TABS
75.0000 mg | ORAL_TABLET | Freq: Every day | ORAL | 0 refills | Status: AC
  Filled 2024-04-14: qty 21, 21d supply, fill #0

## 2024-04-14 MED ORDER — CLOPIDOGREL BISULFATE 75 MG PO TABS
75.0000 mg | ORAL_TABLET | Freq: Every day | ORAL | Status: DC
  Administered 2024-04-14: 13:00:00 75 mg via ORAL
  Filled 2024-04-14: qty 1

## 2024-04-14 NOTE — Plan of Care (Signed)
 Problem: Education: Goal: Knowledge of General Education information will improve Description: Including pain rating scale, medication(s)/side effects and non-pharmacologic comfort measures Outcome: Adequate for Discharge   Problem: Health Behavior/Discharge Planning: Goal: Ability to manage health-related needs will improve Outcome: Adequate for Discharge   Problem: Clinical Measurements: Goal: Ability to maintain clinical measurements within normal limits will improve Outcome: Adequate for Discharge Goal: Will remain free from infection Outcome: Adequate for Discharge Goal: Diagnostic test results will improve Outcome: Adequate for Discharge Goal: Respiratory complications will improve Outcome: Adequate for Discharge Goal: Cardiovascular complication will be avoided Outcome: Adequate for Discharge   Problem: Activity: Goal: Risk for activity intolerance will decrease Outcome: Adequate for Discharge   Problem: Nutrition: Goal: Adequate nutrition will be maintained Outcome: Adequate for Discharge   Problem: Coping: Goal: Level of anxiety will decrease Outcome: Adequate for Discharge   Problem: Elimination: Goal: Will not experience complications related to bowel motility Outcome: Adequate for Discharge Goal: Will not experience complications related to urinary retention Outcome: Adequate for Discharge   Problem: Pain Managment: Goal: General experience of comfort will improve and/or be controlled Outcome: Adequate for Discharge   Problem: Safety: Goal: Ability to remain free from injury will improve Outcome: Adequate for Discharge   Problem: Skin Integrity: Goal: Risk for impaired skin integrity will decrease Outcome: Adequate for Discharge   Problem: Education: Goal: Ability to describe self-care measures that may prevent or decrease complications (Diabetes Survival Skills Education) will improve Outcome: Adequate for Discharge Goal: Individualized Educational  Video(s) Outcome: Adequate for Discharge   Problem: Coping: Goal: Ability to adjust to condition or change in health will improve Outcome: Adequate for Discharge   Problem: Fluid Volume: Goal: Ability to maintain a balanced intake and output will improve Outcome: Adequate for Discharge   Problem: Health Behavior/Discharge Planning: Goal: Ability to identify and utilize available resources and services will improve Outcome: Adequate for Discharge Goal: Ability to manage health-related needs will improve Outcome: Adequate for Discharge   Problem: Metabolic: Goal: Ability to maintain appropriate glucose levels will improve Outcome: Adequate for Discharge   Problem: Nutritional: Goal: Maintenance of adequate nutrition will improve Outcome: Adequate for Discharge Goal: Progress toward achieving an optimal weight will improve Outcome: Adequate for Discharge   Problem: Skin Integrity: Goal: Risk for impaired skin integrity will decrease Outcome: Adequate for Discharge   Problem: Tissue Perfusion: Goal: Adequacy of tissue perfusion will improve Outcome: Adequate for Discharge   Problem: Education: Goal: Knowledge of disease or condition will improve Outcome: Adequate for Discharge Goal: Knowledge of secondary prevention will improve (MUST DOCUMENT ALL) Outcome: Adequate for Discharge Goal: Knowledge of patient specific risk factors will improve (DELETE if not current risk factor) Outcome: Adequate for Discharge   Problem: Ischemic Stroke/TIA Tissue Perfusion: Goal: Complications of ischemic stroke/TIA will be minimized Outcome: Adequate for Discharge   Problem: Coping: Goal: Will verbalize positive feelings about self Outcome: Adequate for Discharge Goal: Will identify appropriate support needs Outcome: Adequate for Discharge   Problem: Health Behavior/Discharge Planning: Goal: Ability to manage health-related needs will improve Outcome: Adequate for Discharge Goal:  Goals will be collaboratively established with patient/family Outcome: Adequate for Discharge   Problem: Self-Care: Goal: Ability to participate in self-care as condition permits will improve Outcome: Adequate for Discharge Goal: Verbalization of feelings and concerns over difficulty with self-care will improve Outcome: Adequate for Discharge Goal: Ability to communicate needs accurately will improve Outcome: Adequate for Discharge   Problem: Nutrition: Goal: Risk of aspiration will decrease Outcome: Adequate for Discharge  Goal: Dietary intake will improve Outcome: Adequate for Discharge   Problem: Acute Rehab OT Goals (only OT should resolve) Goal: Pt. Will Perform Grooming Outcome: Adequate for Discharge Goal: Pt. Will Perform Lower Body Dressing Outcome: Adequate for Discharge Goal: Pt. Will Transfer To Toilet Outcome: Adequate for Discharge   Problem: Acute Rehab PT Goals(only PT should resolve) Goal: Pt Will Transfer Bed To Chair/Chair To Bed Outcome: Adequate for Discharge Goal: Pt Will Ambulate Outcome: Adequate for Discharge Goal: Pt Will Go Up/Down Stairs Outcome: Adequate for Discharge

## 2024-04-14 NOTE — Evaluation (Signed)
 Speech Language Pathology Evaluation Patient Details Name: Terri [name] MRN: [id number] DOB: 04/25/1951 Today's Date: 04/14/2024 Time: 8984-8942 SLP Time Calculation (min) (ACUTE ONLY): 42 min  Problem List:  Patient Active Problem List   Diagnosis Date Noted   Stroke (HCC) 04/13/2024   Chronic kidney disease, stage 3a (HCC) 04/13/2024   Depression 04/13/2024   Overweight (BMI 25.0-29.9) 04/13/2024   Fall at home, initial encounter 04/13/2024   Type II diabetes mellitus with renal manifestations (HCC) 04/13/2024   Meningioma (HCC) 04/13/2024   Neuropathy 02/04/2024   Decreased hearing of both ears 02/04/2024   Rash 07/01/2023   Muscle strain of chest wall 07/01/2023   Peripheral vascular disease 05/14/2023   Personal history of fall 04/28/2023   COPD exacerbation (HCC) 03/29/2023   Dyspnea on exertion 02/02/2023   Vitamin D  deficiency 02/02/2023   Vitamin B 12 deficiency 02/02/2023   Diabetic eye exam (HCC) 02/02/2023   Aortic atherosclerosis 02/02/2023   Bradycardia 01/14/2023   Anemia 09/28/2022   Postmenopausal bleeding 01/30/2022   Endometrial cancer (HCC) 01/25/2022   Mild aortic stenosis 10/12/2020   Acute respiratory failure with hypoxia (HCC) 08/05/2015   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   Class 2 severe obesity due to excess calories with serious comorbidity in adult 08/19/2013   Obesity, Class II, BMI 35-39.9, with comorbidity 08/19/2013   Chronic obstructive pulmonary disease, unspecified (HCC) 07/31/2012   Asthma with chronic obstructive pulmonary disease (COPD) (HCC) 07/31/2012   Atherosclerosis of native arteries of extremity with intermittent claudication 03/12/2012   Tobacco use 03/12/2012   Hyperlipidemia associated with type 2 diabetes mellitus (HCC) 06/06/2011   Hypertension associated with diabetes (HCC) 06/06/2011   Mood disorder 06/06/2011   Type 2 diabetes with complication (HCC) 06/06/2011   Past Medical History:  Past Medical History:  Diagnosis Date    Adenomatous colon polyp    Aortic stenosis    Arthritis    Asthma    Cancer (HCC)    Claudication    COPD (chronic obstructive pulmonary disease) (HCC)    Depression    Diabetes mellitus without complication (HCC)    Esophageal dysphagia    GERD (gastroesophageal reflux disease)    Hyperlipemia    Hypertension    Obesity    Severe obesity (BMI 35.0-35.9 with comorbidity) (HCC)    Past Surgical History:  Past Surgical History:  Procedure Laterality Date   BLADDER SURGERY     COLONOSCOPY WITH PROPOFOL  N/A 08/05/2015   Procedure: COLONOSCOPY WITH PROPOFOL ;  Surgeon: Donnice Vaughn Manes, MD;  Location[facility] ENDOSCOPY;  Service: Endoscopy;  Laterality: N/A;   ESOPHAGOGASTRODUODENOSCOPY N/A 01/09/2022   Procedure: ESOPHAGOGASTRODUODENOSCOPY (EGD);  Surgeon: Maryruth Ole DASEN, MD;  Location: [facility] ENDOSCOPY;  Service: Endoscopy;  Laterality: N/A;   FOOT SURGERY Left    FRACTURE SURGERY     IR CV LINE INJECTION  06/22/2022   IR IMAGING GUIDED PORT INSERTION  06/05/2022   IR PORT REPAIR CENTRAL VENOUS ACCESS DEVICE  07/02/2022   LOWER EXTREMITY ANGIOGRAPHY Left 02/04/2023   Procedure: Lower Extremity Angiography;  Surgeon: Marea Selinda RAMAN, MD;  Location: [facility] INVASIVE CV LAB;  Service: Cardiovascular;  Laterality: Left;   ORIF ANKLE FRACTURE Left 03/26/2017   Procedure: OPEN REDUCTION INTERNAL FIXATION (ORIF) ANKLE FRACTURE;  Surgeon: Edie Norleen PARAS, MD;  Location: [facility] ORS;  Service: Orthopedics;  Laterality: Left;   HPI:  Pt is a 73 y.o. female with medical history significant of HTN, HLD, DM, COPD, asthma, PVD, depression, CKD-3A, aortic stenosis, esophageal dysphagia, endometrial cancer (s/p  of Chemotherapy and radiation therapy, no surgery per pt), who presents with Multiple falls, poor balance, difficulty speaking, right hand weakness, and abnormal MRI of brain with stroke. Pt states that she has poor balance and multiple falls in the past 4 weeks. Pt denied any difficulty speaking or  swallowing. She is Not wearing her Dentures.   MRI this admit: Acute and subacute on chronic patchy infarcts in the posterior Right MCA  territory, including motor strip involvement. Mild cytotoxic edema, and also  faint post-ischemic enhancement, with no hemorrhagic transformation or mass  effect.  2. No metastatic disease identified. Incidental small chronic right vertex  meningioma (9 mm), appears inconsequential.  3. Underlying moderately advanced chronic small vessel disease.   Assessment / Plan / Recommendation Clinical Impression   Pt seen for informal assessment of cognitive-communication abilities today. It had been reported at admit that pt had difficulty speaking prior to admit, but pt denied any difficulty w/ her speech and language. She stated she is not wearing her Denture(and only few front lower teeth noted)- this can impact articulatory precision of speech. Pt does endorse mild, inconsistent Memory issues in setting of her current Chemotherapy txs- she has been told this should improve when she completes tx. This should be monitored by her PCP and Oncologist for any further need and/or f/u by OP Speech therapy.  Pt awake, verbally responsive and engaged w/ Staff and this SLP in full conversation. Pt completed informal assessment appropriately.   Pt on RA, afebrile. WBC wnl.     Pt appears to present w/ functional cognitive-communication skills and no overt  receptive/expressive Aphasia was noted. Pt completed tasks/portions of Western Aphasia Battery Revised (Bedside Record Form) and informal Cognitive assessment using the [employer] Mental Status(SLUMS). Pt exhibited functional Orientation, Fluency, basic Calculations, and Recall of Paragraph information. She exhibited mild deficits in Delayed Recall/Memory of given words- w/ cue, accuracy increased to 100%. Pt was able to ID problem-solving situations.  Pt presented w/ functional OM skills- no OM weakness noted. Pt was not wearing  her Dentures and had Missing Teeth on the bottom- both of which can impact articulatory precision of speech. During tasks and informal conversation, no receptive/expressive aphasia noted. No anomia nor paraphasias noted. No deficits in convergent/divergent naming tasks.  Pt did Not feel she had any change nor problem w/ her speech.   Discussed the above w/ pt; she is aware her Memory challenges but stated she knows to ask questions if not remembering something. Discussed basic Memory strategies including asking questions, repetition, and writing information down/taking notes of details of a TC or conversation or appointment. Encouraged pt to f/u w/ her PCP and Oncologist if any further decline in her Memory is noted- recommendations can be given to OP Speech therapy. MD/NSG updated, agreed. No further skilled needs currently. ST can be available for further education while admitted.    SLP Assessment  SLP Recommendation/Assessment: Patient does not need any further Speech Language Pathology Services SLP Visit Diagnosis: Cognitive communication deficit (R41.841)     Assistance Recommended at Discharge  PRN  Functional Status Assessment Patient has not had a recent decline in their functional status  Frequency and Duration  (n/a)   (n/a)      SLP Evaluation Cognition  Overall Cognitive Status: History of cognitive impairments - at baseline (mild Memory problems per pt d/t my Chemotherapy) Arousal/Alertness: Awake/alert Orientation Level: Oriented X4 Year: 2025 Month: December Day of Week: Correct Attention: Focused;Sustained Focused Attention: Appears intact  Sustained Attention: Appears intact Memory: Impaired (mild) Memory Impairment: Decreased recall of new information Awareness: Appears intact Problem Solving: Appears intact Executive Function: Reasoning Reasoning: Appears intact Behaviors:  (n/a) Safety/Judgment: Appears intact       Comprehension  Auditory  Comprehension Overall Auditory Comprehension: Appears within functional limits for tasks assessed Yes/No Questions: Within Functional Limits Commands: Within Functional Limits Conversation: Complex Interfering Components:  (n/a) Visual Recognition/Discrimination Discrimination: Not tested Reading Comprehension Reading Status: Not tested    Expression Expression Primary Mode of Expression: Verbal Verbal Expression Overall Verbal Expression: Appears within functional limits for tasks assessed Initiation: No impairment Automatic Speech:  (wfl) Level of Generative/Spontaneous Verbalization: Conversation Repetition: No impairment Naming: No impairment Pragmatics: No impairment Interfering Components:  (n/a) Non-Verbal Means of Communication: Not applicable Written Expression Written Expression: Not tested   Oral / Motor  Oral Motor/Sensory Function Overall Oral Motor/Sensory Function: Within functional limits Motor Speech Overall Motor Speech: Appears within functional limits for tasks assessed Respiration: Within functional limits Phonation: Normal Resonance: Within functional limits Articulation: Within functional limitis (in setting of not wearing Dentures; missing teeth) Intelligibility: Intelligible Motor Planning: Within functional limits Motor Speech Errors: Not applicable Interfering Components: Inadequate dentition             Comer Portugal, MS, CCC-SLP Speech Language Pathologist Rehab Services; [facility] [phone] (ascom) Cordarrel Stiefel 04/14/2024, 11:09 AM

## 2024-04-14 NOTE — Discharge Summary (Addendum)
 Physician Discharge Summary   Patient: Terri [name] MRN: [id number] DOB: May 03, 1950  Admit date:     04/13/2024  Discharge date: 04/14/2024  Discharge Physician: Murvin Mana   PCP: Gretel App, NP   Recommendations at discharge:   Follow-up with with PCP in 1 week. Follow-up with neurology in 1 month. Follow-up with vascular surgery in 3 weeks.  Discharge Diagnoses: Principal Problem:   Stroke [facility]) Active Problems:   Fall at home, initial encounter   Hypertension associated with diabetes (HCC)   Hyperlipidemia associated with type 2 diabetes mellitus (HCC)   Type II diabetes mellitus with renal manifestations (HCC)   Chronic kidney disease, stage 3a (HCC)   Chronic obstructive pulmonary disease, unspecified (HCC)   Meningioma (HCC)   Endometrial cancer (HCC)   Depression   Overweight (BMI 25.0-29.9)  Resolved Problems:   * No resolved hospital problems. *  Hospital Course: Terri [name] is a 73 y.o. female with medical history significant of HTN, HLD, DM, COPD, asthma, PVD, depression, CKD-3A, aortic stenosis, esophageal dysphagia, endometrial cancer (s/p of chemotherapy and radiation therapy, no surgery per pt), who presents with Multiple falls, poor balance, difficulty speaking, right hand weakness, and abnormal MRI of brain with stroke.   Her brain MRI showed acute/subacute right MCA stroke.  CT angiogram showed right carotid bulb with estimated 50-60% stenosis.  Due to recurrent right MCA stroke, stenosis may be significant.  As a result, carotid ultrasound was obtained.  This has been reviewed by Dr. Franz, agree that patient has 50 to 60% stenosis.  The treatment plan is continue aspirin , and Plavix  for 21 days, continue Crestor.  Patient will follow-up with Dr. Franz in the office to consider vascular intervention.  Patient also had echocardiogram scheduled, could not perform today due to capacity.  Discussed with Dr. Matthews, patient unlikely has cardiac source of  stroke.  Will not keep patient overnight for echocardiogram tomorrow.  Patient also has hyperglycemia, will need to follow-up with PCP to adjust insulin  treatment.  Patient also seen by PT/OT, recommended acute rehab.  Patient refused it, she is adamant she had to go home today.  Will set home health with PT /OT.     Consultants: Neurology. Procedures performed: None  Disposition: Home health Diet recommendation:  Cardiac diet DISCHARGE MEDICATION: Allergies as of 04/14/2024   No Known Allergies      Medication List     STOP taking these medications    FreeStyle Libre 3 Plus Sensor Misc   Iron  (Ferrous Sulfate ) 325 (65 Fe) MG Tabs   megestrol  40 MG tablet Commonly known as: MEGACE        TAKE these medications    albuterol  108 (90 Base) MCG/ACT inhaler Commonly known as: VENTOLIN  HFA Inhale 2 puffs into the lungs every 6 (six) hours as needed for wheezing or shortness of breath.   amLODipine  10 MG tablet Commonly known as: NORVASC  Take 1 tablet (10 mg total) by mouth daily.   aspirin  EC 81 MG tablet Take 81 mg by mouth daily. Swallow whole.   atorvastatin  80 MG tablet Commonly known as: LIPITOR Take 1 tablet (80 mg total) by mouth daily.   budesonide -glycopyrrolate -formoterol  160-9-4.8 MCG/ACT Aero inhaler Commonly known as: BREZTRI  Inhale 2 puffs into the lungs 2 (two) times daily. Via PAP AZ&Me   buPROPion  300 MG 24 hr tablet Commonly known as: WELLBUTRIN  XL Take 1 tablet (300 mg total) by mouth daily.   citalopram  20 MG tablet Commonly known as: CELEXA  Take 1 tablet (20  mg total) by mouth daily.   cloNIDine  0.2 MG tablet Commonly known as: CATAPRES  Take 1 tablet (0.2 mg total) by mouth 2 (two) times daily.   clopidogrel  75 MG tablet Commonly known as: PLAVIX  Take 1 tablet (75 mg total) by mouth daily for 21 days. Start taking on: April 15, 2024   cyanocobalamin  1000 MCG tablet Commonly known as: VITAMIN B12 Take 1 tablet (1,000 mcg total)  by mouth daily.   insulin  aspart 100 UNIT/ML injection Commonly known as: novoLOG  Inject 8 Units into the skin 3 (three) times daily before meals.   losartan -hydrochlorothiazide  100-25 MG tablet Commonly known as: HYZAAR Take 1 tablet by mouth daily.   metoprolol  succinate 50 MG 24 hr tablet Commonly known as: TOPROL -XL Take 1 tablet (50 mg total) by mouth daily.   montelukast  10 MG tablet Commonly known as: SINGULAIR  Take 10 mg by mouth at bedtime.   ondansetron  8 MG tablet Commonly known as: ZOFRAN  Take 1 tablet (8 mg total) by mouth every 8 (eight) hours as needed for nausea or vomiting.   Tresiba  FlexTouch 100 UNIT/ML FlexTouch Pen Generic drug: insulin  degludec Inject 20 Units into the skin daily. 20 units once daily - REPLACES Levemir    Vitamin D3 50 MCG (2000 UT) capsule Take 2,000 Units by mouth daily.        Follow-up Information     Gretel App, NP Follow up.   Specialty: Nurse Practitioner Why: hospital follow up Contact information: [address] [place] [address] [phone]         [facility] Follow up in 1 month(s).   Contact information: [address] [place]  [address] [phone]        Jama Cordella MATSU, MD Follow up in 2 week(s).   Specialties: Vascular Surgery, Cardiology, Radiology, Vascular Surgery Contact information: [address] [place] [address] [phone]                Discharge Exam: [name]   04/13/24 1502 04/13/24 2103  Weight: 66.2 kg 66.3 kg   General exam: Appears calm and comfortable  Respiratory system: Clear to auscultation. Respiratory effort normal. Cardiovascular system: S1 & S2 heard, RRR. No JVD, murmurs, rubs, gallops or clicks. No pedal edema. Gastrointestinal system: Abdomen is nondistended, soft and nontender. No organomegaly or masses felt. Normal bowel sounds heard. Central nervous system:  Alert and oriented. No focal neurological deficits. Extremities: Symmetric 5 x 5 power. Skin: No rashes, lesions or ulcers Psychiatry: Judgement and insight appear normal. Mood & affect appropriate.    Condition at discharge: good  The results of significant diagnostics from this hospitalization (including imaging, microbiology, ancillary and laboratory) are listed below for reference.   Imaging Studies:  CT ANGIO HEAD NECK W WO CM Result Date: 04/14/2024 EXAM: CT HEAD WITHOUT CTA HEAD AND NECK WITH AND WITHOUT 04/14/2024 01:56:05 AM TECHNIQUE: CTA of the head and neck was performed with and without the administration of 75 mL of intravenous iohexol  (OMNIPAQUE ) 350 MG/ML injection. Noncontrast CT of the head with reconstructed 2-D images are also provided for review. Multiplanar 2D and/or 3D reformatted images are provided for review. Automated exposure control, iterative reconstruction, and/or weight based adjustment of the mA/kV was utilized to reduce the radiation dose to as low as reasonably achievable. COMPARISON: Compared to prior study from 04/13/2024. CLINICAL HISTORY: Stroke/TIA, determine embolic source. FINDINGS: CT HEAD: BRAIN AND VENTRICLES: Age-related cerebral atrophy with moderate chronic microvascular ischemic  disease. Previously identified small right cerebral infarcts are not well visualized by CT. No acute intracranial hemorrhage. Small meningioma at the right vertex without mass effect or edema. No mass effect. No midline shift. No extra-axial fluid collection. No evidence of acute infarct. No hydrocephalus. ORBITS: No acute abnormality. SINUSES AND MASTOIDS: No acute abnormality. CTA NECK: AORTIC ARCH AND ARCH VESSELS: Visualized aortic arch within normal limits for caliber with standard branch pattern. Aortic atherosclerosis. No high-grade stenosis about the origin of the great vessels. CERVICAL CAROTID ARTERIES: Calcified plaque about the right carotid bulb with estimated 50 to  60 percent stenosis by NASCET criteria. Atheromatous change about the left carotid bulb without hemodynamically significant greater than 50% stenosis. No dissection or arterial injury. CERVICAL VERTEBRAL ARTERIES: Atheromatous change at the origins of both vertebral arteries with severe stenosis on the left. Vertebral arteries otherwise patent without significant stenosis or dissection. LUNGS AND MEDIASTINUM: Unremarkable. SOFT TISSUES: Right-sided port-a-cath in place. BONES: Moderate cervical spondylosis, most pronounced at C6-C7. CTA HEAD: ANTERIOR CIRCULATION: Atheromatous change about the carotid siphons with associated moderate stenoses bilaterally. Right A1 segment hypoplastic. No significant stenosis of the anterior cerebral arteries. No significant stenosis of the middle cerebral arteries. No aneurysm. POSTERIOR CIRCULATION: Left vertebral artery dominant. Neither PICA origin well visualized. Focal moderate stenosis at the right P1-P2 junction (series 13, image 21). No significant stenosis of the basilar artery. No aneurysm. OTHER: No dural venous sinus thrombosis on this non-dedicated study. IMPRESSION: 1. Negative CTA for large vessel occlusion or other emergent finding. 2. Atheromatous plaque about the right carotid bulb with estimated 50-60% stenosis. 3. Severe atheromatous stenosis at the origin of the dominant left vertebral artery. 4. Atheromatous change about the carotid siphons with associated moderate stenoses bilaterally. 5. Focal moderate stenosis at the right P1-P2 junction. 6. Previously identified small right cerebral infarcts are not well visualized by CT. No other new acute intracranial abnormality. Electronically signed by: Morene Hoard MD 04/14/2024 02:43 AM EST RP Workstation: [id number]   MR Brain W Wo Contrast Addendum Date: 04/13/2024  ADDENDUM: Study discussed by telephone with Dr. Jacobo at 11:45 hours on 04/13/2024. ----------------------------------------------------  Electronically signed by: Helayne Hurst MD 04/13/2024 11:51 AM EST RP Workstation: [id number]   Result Date: 04/13/2024 ORIGINAL REPORT  EXAM: MRI BRAIN WITH AND WITHOUT CONTRAST 04/13/2024 11:23:52 AM TECHNIQUE: Multiplanar multisequence MRI of the head/brain was performed with and without the administration of intravenous contrast. 7 mL gadobutrol  (GADAVIST ) 1 MMOL/ML injection. COMPARISON: CT head 06/05/2023. CLINICAL HISTORY: 73 year old female with weakness, slurred speech, and endometrial carcinoma. FINDINGS: BRAIN AND VENTRICLES: No hydrocephalus. The sella is unremarkable. Normal flow voids. The major dural venous sinuses are enhancing and appear to be patent. Small chronic right vertex parasagittal extra axial calcification on prior CT appears to be heterogeneously enhancing (series 18 image 17 and series 17 image 20), stable in size compatible with incidental small 9 mm vertex meningioma. This is adjacent to the superior sagittal sinus which remains patent. Mild motion artifact on postcontrast images. Faint curvilinear enhancement along the posterior right middle frontal gyrus (series 16 image 116, series 17 image 23 and series 18 image 5) is at or near the lateral perirolandic cortex. However, no associated edema or mass effect there (series 9 image 38). But furthermore, adjacent patchy but confluent diffusion restriction in the posterior right frontal lobe white matter nearby (series 6 image 37) tracking toward the right corona radiata. Faint DWI abnormality extending toward the area of enhancement (series 6 image 36). Additional discontinuous cortical  restricted diffusion at the right superior motor strip (series 6 image 43). These areas demonstrate mild T2 and FLAIR hyperintense cytotoxic edema (series 9 image 42) with no hemorrhage or mass effect. Superimposed patchy cortical encephalomalacia at the right frontal operculum anterior to the acute findings (series 9 image 37). Patchy, indistinct,  confluent additional bilateral cerebral white matter T2 and FLAIR hyperintensity. No significant chronic cerebral blood products on SWI. No other cortical encephalomalacia. No other abnormal diffusion. No other abnormal intracranial enhancement is identified. No suspicious dural thickening or enhancement beyond the described meningioma. Moderate T2 heterogeneity in the bilateral basal ganglia. Tiny chronic infarct in the medial right cerebellum. ORBITS: Postoperative changes to both globes. SINUSES: No acute abnormality. BONES AND SOFT TISSUES: Normal for age visible cervical spine, spinal cord. Normal bone marrow signal and enhancement. No acute soft tissue abnormality. IMPRESSION: 1. 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 Mild cytotoxic edema, and also faint post-ischemic enhancement, with no hemorrhagic transformation or mass effect. 2. No metastatic disease identified. 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 3. Underlying moderately advanced chronic small vessel disease. Electronically signed by: Helayne Hurst MD 04/13/2024 11:38 AM EST RP Workstation: [id number]  Microbiology: Results for orders placed or performed during the hospital encounter of 03/29/23  Resp panel by RT-PCR (RSV, Flu A&B, Covid) Anterior Nasal Swab     Status: None   Collection Time: 03/29/23  2:51 PM   Specimen: Anterior Nasal Swab  Result Value Ref Range Status   SARS Coronavirus 2 by RT PCR NEGATIVE NEGATIVE Final    Comment: (NOTE) SARS-CoV-2 target nucleic acids are NOT DETECTED.  The SARS-CoV-2 RNA is generally detectable in upper respiratory specimens during the acute phase of infection. The lowest concentration of SARS-CoV-2 viral copies this assay can detect is 138 copies/mL. A negative result does not preclude SARS-Cov-2 infection and should not be used as the sole basis for treatment or other  patient management decisions. A negative result may occur with  improper specimen collection/handling, submission of specimen other than nasopharyngeal swab, presence of viral mutation(s) within the areas targeted by this assay, and inadequate number of viral copies(<138 copies/mL). A negative result must be combined with clinical observations, patient history, and epidemiological information. The expected result is Negative.  Fact Sheet for Patients:  bloggercourse.com  Fact Sheet for Healthcare Providers:  seriousbroker.it  This test is no t yet approved or cleared by the [place]  FDA and  has been authorized for detection and/or diagnosis of SARS-CoV-2 by FDA under an Emergency Use Authorization (EUA). This EUA will remain  in effect (meaning this test can be used) for the duration of the COVID-19 declaration under Section 564(b)(1) of the Act, 21 U.S.C.section 360bbb-3(b)(1), unless the authorization is terminated  or revoked sooner.       Influenza A by PCR NEGATIVE NEGATIVE Final   Influenza B by PCR NEGATIVE NEGATIVE Final    Comment: (NOTE) The Xpert Xpress SARS-CoV-2/FLU/RSV plus assay is intended as an aid in the diagnosis of influenza from Nasopharyngeal swab specimens and should not be used as a sole basis for treatment. Nasal washings and aspirates are unacceptable for Xpert Xpress SARS-CoV-2/FLU/RSV testing.  Fact Sheet for Patients: bloggercourse.com  Fact Sheet for Healthcare Providers: seriousbroker.it  This test is not yet approved or cleared by the [place]  FDA and has been authorized for detection and/or diagnosis of SARS-CoV-2 by FDA under an Emergency Use Authorization (  EUA). This EUA will remain in effect (meaning this test can be used) for the duration of the COVID-19 declaration under Section 564(b)(1) of the Act, 21 U.S.C. section  360bbb-3(b)(1), unless the authorization is terminated or revoked.     Resp Syncytial Virus by PCR NEGATIVE NEGATIVE Final    Comment: (NOTE) Fact Sheet for Patients: bloggercourse.com  Fact Sheet for Healthcare Providers: seriousbroker.it  This test is not yet approved or cleared by the [place]  FDA and has been authorized for detection and/or diagnosis of SARS-CoV-2 by FDA under an Emergency Use Authorization (EUA). This EUA will remain in effect (meaning this test can be used) for the duration of the COVID-19 declaration under Section 564(b)(1) of the Act, 21 U.S.C. section 360bbb-3(b)(1), unless the authorization is terminated or revoked.  Performed at [facility], [address] [place], [place] [address]     Labs: CBC: Recent Labs  Lab 04/13/24 1505  WBC 5.6  NEUTROABS 4.7  HGB 12.3  HCT 37.6  MCV 86.8  PLT 190   Basic Metabolic Panel: Recent Labs  Lab 04/13/24 1505  NA 139  K 3.8  CL 101  CO2 27  GLUCOSE 299*  BUN 20  CREATININE 1.04*  CALCIUM  9.3   Liver Function Tests: Recent Labs  Lab 04/13/24 1505  AST 15  ALT 11  ALKPHOS 110  BILITOT 0.3  PROT 6.2*  ALBUMIN 3.9   CBG: Recent Labs  Lab 04/13/24 2117 04/13/24 2353 04/14/24 1020 04/14/24 1210  GLUCAP 252* 206* 298* 258*    Discharge time spent: 35 minutes.  Signed: Murvin Mana, MD [facility] 04/14/2024

## 2024-04-14 NOTE — Plan of Care (Signed)
  Problem: Health Behavior/Discharge Planning: Goal: Ability to manage health-related needs will improve Outcome: Progressing   Problem: Clinical Measurements: Goal: Ability to maintain clinical measurements within normal limits will improve Outcome: Progressing   Problem: Activity: Goal: Risk for activity intolerance will decrease Outcome: Progressing   Problem: Safety: Goal: Ability to remain free from injury will improve Outcome: Progressing   

## 2024-04-14 NOTE — Consult Note (Incomplete)
 [place], [place] [address]  HPI: Terri [name] is a 73 y.o. female with medical history significant of HTN, HLD, DM, COPD, asthma, PVD, depression, CKD-3A, aortic stenosis, esophageal dysphagia, endometrial cancer (s/p of chemotherapy and radiation therapy, no surgery per pt), who presents with Multiple falls, poor balance, difficulty speaking, right hand weakness, and abnormal MRI of brain with stroke.   Pt states that she has poor balance and multiple falls in the past 4 weeks.  She had right sided weakness recently which has resolved.  She also has mild difficulty speaking.  She reports right handed weakness described as difficulty opening right hand.  No vision loss or hearing loss.  No facial droop.  Currently denies numbness or tingling in extremities.  Patient does not have chest pain, cough, SOB.  No nausea, vomiting, diarrhea or abdominal pain.  Denies symptoms of UTI.  She states that she has intermittent vaginal bleeding due to endometrial cancer.   Pt was seen by her oncologist, Dr. Jacobo on 12/8, who ordered an MRI for brain which resulted today, showing stroke.  Patient is sent to ED for further evaluation and treatment.   MRI-brain: 1. 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 Mild cytotoxic edema, and also faint post-ischemic enhancement, with no hemorrhagic transformation or mass effect. 2. No metastatic disease identified. 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 3. Underlying moderately advanced chronic small vessel disease.  Stroke workup  CTA H&N 1. Negative CTA for large vessel occlusion or other emergent finding. 2. Atheromatous plaque about the right carotid bulb with estimated 50-60% stenosis. 3. Severe atheromatous stenosis at the origin of the dominant left vertebral artery. 4. Atheromatous change about the carotid siphons with associated moderate stenoses  bilaterally. 5. Focal moderate stenosis at the right P1-P2 junction. 6. Previously identified small right cerebral infarcts are not well visualized by CT. No other new acute intracranial abnormality.  Stroke Labs     Component Value Date/Time   CHOL 140 04/14/2024 0521   TRIG 69 04/14/2024 0521   HDL 37 (L) 04/14/2024 0521   CHOLHDL 3.8 04/14/2024 0521   VLDL 14 04/14/2024 0521   LDLCALC 89 04/14/2024 0521    Lab Results  Component Value Date/Time   HGBA1C 6.2 (A) 02/04/2024 11:00 AM   HGBA1C 6.2 02/04/2024 11:00 AM   HGBA1C 6.2 02/04/2024 11:00 AM   HGBA1C 6.2 02/04/2024 11:00 AM   Recs*** - ASA 81mg  daily + plavix  75mg  daily x21 days f/b ASA 81mg  daily monotherapy after that - Continue atorvastatin  80mg  day - Continue rehab - I will set up outpatient neurology f/u

## 2024-04-14 NOTE — Progress Notes (Addendum)
 Inpatient Rehab Admissions Coordinator Note:   Per therapy recommendations patient was screened for CIR candidacy by Reche FORBES Lowers, PT.  At this time, she is supervision to [facility] with PT and supervision to mod I with OT.  SLP has signed off as pt does not appear to have a decline in function per their assessment.  She does not appear to have need for intensive therapy in 2 or more disciplines.  No consult at this time.    Reche Lowers, PT, DPT [phone] 04/14/2024 4:28 PM

## 2024-04-14 NOTE — Evaluation (Signed)
 Occupational Therapy Evaluation Patient Details Name: Terri [name] MRN: [id number] DOB: February 09, 1951 Today's Date: 04/14/2024   History of Present Illness   Pt is a 73 y.o. 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 PMH of HTN, HLD, DM, COPD, asthma, PVD, depression, CKD-3A, aortic stenosis, esophageal dysphagia, endometrial cancer (s/p of chemotherapy and radiation therapy, no surgery per pt     Clinical Impressions Pt was seen for OT evaluation this date. PTA, she lives alone in a 2 story home with a flight of steps up to 2nd level where her bedroom is located. Reports MOD I/IND at baseline with no AD use in the home d/t not enough space. Endorses multiple falls in the last 6 months. States she was going to outpatient therapy prior to admission and utilizes local transportation services. Pt presents with deficits in balance and activity tolerance limiting their ability to perform ADL management at baseline level. Pt currently requires CGA for bed mobility d/t posterior lean requiring cues to correct. BUE ROM, strength, sensation and coordination appear intact. Mild swelling to L hand with slowed dexterity. CGA for transfers and ambulation utilizing RW for short distance of ~50 ft prior to fatigue. Pt with additional stand and again posterior bias noted, but no LOB. She remains a high fall risk, recommended acute IPR as well as HH services, but pt declining stating she only wants to return home and continue with outpatient therapy. Will follow acutely to maximize safety, strength and return to PLOF.      If plan is discharge home, recommend the following:   A little help with walking and/or transfers;A little help with bathing/dressing/bathroom;Help with stairs or ramp for entrance;Assistance with  cooking/housework;Assist for transportation     Functional Status Assessment   Patient has had a recent decline in their functional status and demonstrates the ability to make significant improvements in function in a reasonable and predictable amount of time.     Equipment Recommendations   None recommended by OT     Recommendations for Other Services         Precautions/Restrictions   Precautions Precautions: Fall Recall of Precautions/Restrictions: Intact Restrictions Weight Bearing Restrictions Per Provider Order: No     Mobility Bed Mobility Overal bed mobility: Needs Assistance Bed Mobility: Supine to Sit     Supine to sit: Supervision     General bed mobility comments: increased time and effort noted with posterior lean intermittently requiring cues and increased time to regain upright balance    Transfers Overall transfer level: Needs assistance Equipment used: Rolling walker (2 wheels) Transfers: Sit to/from Stand Sit to Stand: Contact guard assist                  Balance Overall balance assessment: Needs assistance Sitting-balance support: Feet supported Sitting balance-Leahy Scale: Fair     Standing balance support: No upper extremity supported Standing balance-Leahy Scale: Fair                             ADL either performed or assessed with clinical judgement   ADL Overall ADL's : Needs assistance/impaired                     Lower Body Dressing: Supervision/safety;Modified independent Lower Body Dressing Details (indicate cue  type and reason): able to adjust socks while seated EOB via lateral lean             Functional mobility during ADLs: Rolling walker (2 wheels);Supervision/safety General ADL Comments: ambulation using RW ~50 ft with CGA, did not use RW the last 15 ft     Vision Patient Visual Report: No change from baseline       Perception         Praxis         Pertinent  Vitals/Pain Pain Assessment Pain Assessment: No/denies pain     Extremity/Trunk Assessment Upper Extremity Assessment Upper Extremity Assessment: Overall WFL for tasks assessed;Right hand dominant (sensation, coordination, and strength intact)   Lower Extremity Assessment Lower Extremity Assessment: Generalized weakness;Defer to PT evaluation       Communication Communication Communication: No apparent difficulties   Cognition Arousal: Alert Behavior During Therapy: Restless                                 Following commands: Intact       Cueing  General Comments          Exercises Other Exercises Other Exercises: Edu on role of OT in acute setting, falls/safety stategies and use of DME to maximize safety.   Shoulder Instructions      Home Living Family/patient expects to be discharged to:: Private residence Living Arrangements: Alone Available Help at Discharge: Friend(s);Neighbor Type of Home: House Home Access: Stairs to enter Secretary/administrator of Steps: 3 Entrance Stairs-Rails: Left Home Layout: Two level;Bed/bath upstairs Alternate Level Stairs-Number of Steps: 1 flight with L railing Alternate Level Stairs-Rails: Left Bathroom Shower/Tub: Chief Strategy Officer: Standard     Home Equipment: Agricultural Consultant (2 wheels)          Prior Functioning/Environment Prior Level of Function : Independent/Modified Independent             Mobility Comments: reports no AD use household distances, use of RW for community; uses local transportation services to MD appts ADLs Comments: IND/MOD I, groceries are delivered    OT Problem List: Impaired balance (sitting and/or standing);Decreased activity tolerance   OT Treatment/Interventions: Self-care/ADL training;Therapeutic exercise;Therapeutic activities;Energy conservation;DME and/or AE instruction;Patient/family education;Balance training      OT Goals(Current goals can be  found in the care plan section)   Acute Rehab OT Goals Patient Stated Goal: go home OT Goal Formulation: With patient Time For Goal Achievement: 04/28/24 Potential to Achieve Goals: Fair ADL Goals Pt Will Perform Grooming: with modified independence;standing Pt Will Perform Lower Body Dressing: with modified independence;sitting/lateral leans;sit to/from stand Pt Will Transfer to Toilet: with modified independence;ambulating   OT Frequency:  Min 2X/week    Co-evaluation              AM-PAC OT 6 Clicks Daily Activity     Outcome Measure Help from another person eating meals?: None Help from another person taking care of personal grooming?: None Help from another person toileting, which includes using toliet, bedpan, or urinal?: A Little Help from another person bathing (including washing, rinsing, drying)?: A Little Help from another person to put on and taking off regular upper body clothing?: None Help from another person to put on and taking off regular lower body clothing?: A Little 6 Click Score: 21   End of Session Equipment Utilized During Treatment: Rolling walker (2 wheels) Nurse Communication: Mobility status  Activity  Tolerance: Patient tolerated treatment well Patient left: in chair;with call bell/phone within reach;with chair alarm set  OT Visit Diagnosis: Other abnormalities of gait and mobility (R26.89);Muscle weakness (generalized) (M62.81)                Time: 9242-9171 OT Time Calculation (min): 31 min Charges:  OT General Charges $OT Visit: 1 Visit OT Evaluation $OT Eval Moderate Complexity: 1 Mod OT Treatments $Therapeutic Activity: 8-22 mins Treven Holtman Chrismon, OTR/L  04/14/2024, 10:29 AM  Dacari Beckstrand E Chrismon 04/14/2024, 10:24 AM

## 2024-04-14 NOTE — Evaluation (Signed)
 Physical Therapy Evaluation Patient Details Name: Terri [name] MRN: [id number] DOB: 02/17/1951 Today's Date: 04/14/2024  History of Present Illness  Pt is a 73 y/o F admitted on 04/13/24 after presenting with c/o multiple falls, poor balance, difficulty speaking, R hand weakness. 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 PMH: HTN, HLD, COPD, DM, asthma, PVD, depression, CKD 3A, aortic stenosis, esophageal dysphagia, endometrial CA c/p chemo & radiation, obesity  Clinical Impression  Pt seen for PT evaluation with pt agreeable to tx. Pt reports prior to admission she was living alone in 2 level home, furniture walking inside but using RW outside. Pt endorses falling, attending OPPT.  On this date, pt is able to ambulate with RW & supervision<>CGA, notes RLE fatigue with increased gait distance. Pt requires CGA<>min assist for safety with stair negotiation 2/2 decreased balance. PT encouraged pt to use RW vs no AD upon return home. Pt adamant about returning home alone despite her current high fall risk. Recommend ongoing PT services to progress mobility & safety with mobility.      If plan is discharge home, recommend the following: A little help with walking and/or transfers;A little help with bathing/dressing/bathroom;Assistance with cooking/housework;Assist for transportation;Help with stairs or ramp for entrance   Can travel by private vehicle        Equipment Recommendations None recommended by PT  Recommendations for Other Services       Functional Status Assessment Patient has had a recent decline in their functional status and demonstrates the ability to make significant improvements in function in a reasonable and predictable amount of time.     Precautions / Restrictions Precautions Precautions: Fall Restrictions Weight Bearing  Restrictions Per Provider Order: No      Mobility  Bed Mobility               General bed mobility comments: not tested, pt received & left sitting in recliner    Transfers Overall transfer level: Needs assistance Equipment used: Rolling walker (2 wheels) Transfers: Sit to/from Stand Sit to Stand: Supervision                Ambulation/Gait Ambulation/Gait assistance: Supervision, Contact guard assist Gait Distance (Feet): 150 Feet (+ 150 ft) Assistive device: Rolling walker (2 wheels) Gait Pattern/deviations: Decreased step length - right, Decreased step length - left, Decreased stride length Gait velocity: decreased     General Gait Details: RLE begins to fatigue with increased gait distance, Pt requests seated rest break.  Stairs Stairs: Yes Stairs assistance: Contact guard assist, Min assist Stair Management: One rail Right Number of Stairs: 4 (6) General stair comments: Pt negotiates 4 steps with cuing to use R rail to simulate home environment. Pt requires max cuing re: compensatory pattern with fair return demo. Pt with LOB at bottom of stairs requiring min assist to correct, declines further stair negotiation at this time but reports she'll be fine at home.  Wheelchair Mobility     Tilt Bed    Modified Rankin (Stroke Patients Only)       Balance Overall balance assessment: Needs assistance   Sitting balance-Leahy Scale: Good     Standing balance support: During functional activity, Bilateral upper extremity supported, Reliant on assistive device for balance Standing balance-Leahy Scale: Fair  Pertinent Vitals/Pain Pain Assessment Pain Assessment: No/denies pain    Home Living Family/patient expects to be discharged to:: Private residence Living Arrangements: Alone Available Help at Discharge: Friend(s);Neighbor Type of Home: House Home Access: Stairs to enter Entrance Stairs-Rails:  Right Entrance Stairs-Number of Steps: 3 Alternate Level Stairs-Number of Steps: 1 flight with L railing Home Layout: Two level;Bed/bath upstairs Home Equipment: Rolling Walker (2 wheels)      Prior Function               Mobility Comments: Ambulatory with RW outside of the home, furniture walk in the home, notes frequent falls. ADLs Comments: Uses [employer] transportation, grocery delivery     Extremity/Trunk Assessment   Upper Extremity Assessment Upper Extremity Assessment: Defer to OT evaluation;Generalized weakness;Right hand dominant    Lower Extremity Assessment Lower Extremity Assessment:  (R sided general weakness, not formally MMT)       Communication   Communication Communication: No apparent difficulties    Cognition Arousal: Alert Behavior During Therapy: WFL for tasks assessed/performed   PT - Cognitive impairments: Safety/Judgement                       PT - Cognition Comments: eager to go home despite fall risk Following commands: Intact       Cueing Cueing Techniques: Verbal cues     General Comments General comments (skin integrity, edema, etc.): Educated pt on stroke & stroke recovery.    Exercises     Assessment/Plan    PT Assessment Patient needs continued PT services  PT Problem List Decreased strength;Decreased activity tolerance;Decreased balance;Decreased mobility;Decreased knowledge of use of DME;Decreased safety awareness       PT Treatment Interventions Therapeutic exercise;DME instruction;Gait training;Balance training;Neuromuscular re-education;Stair training;Functional mobility training;Therapeutic activities;Patient/family education    PT Goals (Current goals can be found in the Care Plan section)  Acute Rehab PT Goals Patient Stated Goal: go home PT Goal Formulation: With patient Time For Goal Achievement: 04/28/24 Potential to Achieve Goals: Good    Frequency Min 2X/week     Co-evaluation                AM-PAC PT 6 Clicks Mobility  Outcome Measure Help needed turning from your back to your side while in a flat bed without using bedrails?: None Help needed moving from lying on your back to sitting on the side of a flat bed without using bedrails?: A Little Help needed moving to and from a bed to a chair (including a wheelchair)?: A Little Help needed standing up from a chair using your arms (e.g., wheelchair or bedside chair)?: A Little Help needed to walk in hospital room?: A Little Help needed climbing 3-5 steps with a railing? : A Little 6 Click Score: 19    End of Session   Activity Tolerance: Patient tolerated treatment well Patient left: in chair;with call bell/phone within reach;with chair alarm set Nurse Communication: Mobility status PT Visit Diagnosis: Unsteadiness on feet (R26.81);Hemiplegia and hemiparesis;Muscle weakness (generalized) (M62.81);Other abnormalities of gait and mobility (R26.89) Hemiplegia - Right/Left: Right Hemiplegia - dominant/non-dominant: Dominant Hemiplegia - caused by: Cerebral infarction    Time: 9050-8992 PT Time Calculation (min) (ACUTE ONLY): 18 min   Charges:   PT Evaluation $PT Eval Low Complexity: 1 Low   PT General Charges $$ ACUTE PT VISIT: 1 Visit         Richerd Pinal, PT, DPT 04/14/2024, 12:10 PM   Richerd CHRISTELLA Pinal 04/14/2024, 12:08 PM

## 2024-04-14 NOTE — Care Management Obs Status (Signed)
 MEDICARE OBSERVATION STATUS NOTIFICATION   Patient Details  Name: Terri [name] MRN: [id number] Date of Birth: 08-24-1950   Medicare Observation Status Notification Given:  Chaney BRANDY CHRISTIANE LELON, CMA 04/14/2024, 2:24 PM

## 2024-04-14 NOTE — Progress Notes (Signed)
 Patient is from home, admitted for stroke w/u. No defecits noted. No TOC needs at this time.

## 2024-04-15 LAB — GLUCOSE, CAPILLARY: Glucose-Capillary: 143 mg/dL — ABNORMAL HIGH (ref 70–99)

## 2024-04-16 ENCOUNTER — Other Ambulatory Visit: Payer: Self-pay | Admitting: *Deleted

## 2024-04-16 ENCOUNTER — Encounter: Payer: Self-pay | Admitting: Oncology

## 2024-04-16 ENCOUNTER — Ambulatory Visit

## 2024-04-16 MED ORDER — MEGESTROL ACETATE 40 MG PO TABS: 80.0000 mg | ORAL_TABLET | Freq: Two times a day (BID) | ORAL | 1 refills | Status: AC

## 2024-04-17 ENCOUNTER — Ambulatory Visit: Admitting: Physical Therapy

## 2024-04-20 NOTE — Consult Note (Addendum)
 NEUROLOGY CONSULT NOTE   Date of service: 04/14/24 Patient Name: Terri [name] MRN:  [id number] DOB:  [birth year] Chief Complaint: acute ischemic stroke Requesting Provider: Dr. Laurita  History of Present Illness  Terri [name] is a 73 y.o. female with hx of HTN, HL, DM2, endometrial cancer IIIb who presents with mild L sided weakness that has since resolved. Outpatient MRI brain shows acute, subacute, and chronic infarcts layered onto the R MCA territory and she was referred to ED for stroke workup. NIHSS = 2 for drift in BLE.   ROS   Comprehensive ROS performed and pertinent positives documented in HPI   Past History   Past Medical History:  Diagnosis Date   Adenomatous colon polyp    Aortic stenosis    Arthritis    Asthma    Cancer (HCC)    Claudication    COPD (chronic obstructive pulmonary disease) (HCC)    Depression    Diabetes mellitus without complication (HCC)    Esophageal dysphagia    GERD (gastroesophageal reflux disease)    Hyperlipemia    Hypertension    Obesity    Severe obesity (BMI 35.0-35.9 with comorbidity) (HCC)     Past Surgical History:  Procedure Laterality Date   BLADDER SURGERY     COLONOSCOPY WITH PROPOFOL  N/A 08/05/2015   Procedure: COLONOSCOPY WITH PROPOFOL ;  Surgeon: Donnice Vaughn Manes, MD;  Location: [facility] ENDOSCOPY;  Service: Endoscopy;  Laterality: N/A;   ESOPHAGOGASTRODUODENOSCOPY N/A 01/09/2022   Procedure: ESOPHAGOGASTRODUODENOSCOPY (EGD);  Surgeon: Maryruth Ole DASEN, MD;  Location: [facility] ENDOSCOPY;  Service: Endoscopy;  Laterality: N/A;   FOOT SURGERY Left    FRACTURE SURGERY     IR CV LINE INJECTION  06/22/2022   IR IMAGING GUIDED PORT INSERTION  06/05/2022   IR PORT REPAIR CENTRAL VENOUS ACCESS DEVICE  07/02/2022   LOWER EXTREMITY ANGIOGRAPHY Left 02/04/2023   Procedure: Lower Extremity Angiography;  Surgeon: Marea Selinda RAMAN, MD;  Location: [facility] INVASIVE CV LAB;  Service: Cardiovascular;  Laterality: Left;   ORIF ANKLE FRACTURE Left 03/26/2017    Procedure: OPEN REDUCTION INTERNAL FIXATION (ORIF) ANKLE FRACTURE;  Surgeon: Edie Norleen PARAS, MD;  Location: [facility] ORS;  Service: Orthopedics;  Laterality: Left;    Family History: Family History  Problem Relation Age of Onset   CVA Mother    Diabetes Mother    CAD Father    Breast cancer Neg Hx     Social History  reports that she has quit smoking. Her smoking use included cigarettes. She has a 40 pack-year smoking history. She has never used smokeless tobacco. She reports that she does not drink alcohol and does not use drugs.  Allergies[1]  Medications  Current Medications[2]  Vitals   Vitals:   04/13/24 2359 04/14/24 0414 04/14/24 0808 04/14/24 1212  BP: (!) 174/76 (!) 162/59 (!) 164/65 (!) 136/54  Pulse: 66 (!) 57 (!) 56 (!) 59  Resp: 16 18 18 20   Temp: 98.4 F (36.9 C) 98.4 F (36.9 C) 98.6 F (37 C) 98 F (36.7 C)  TempSrc: Oral Oral Oral Oral  SpO2:  97% 99% 100%  Weight:      Height:        Body mass index is 26.71 kg/m.   Physical Exam    Gen: patient lying in bed, NAD CV: extremities appear well-perfused Resp: normal WOB  Neurologic exam MS: alert, oriented x4, follows commands Speech: no dysarthria, no aphasia CN: PERRL, VFF, EOMI, sensation intact, face symmetric, hearing intact to voice Motor:  BUE no drift, BLE drift but not to bed Sensory: SILT Reflexes: 2+ symm with toes down bilat Coordination: FNF intact bilat Gait: deferred   Labs/Imaging/Neurodiagnostic studies   CBC:  Recent Labs  Lab 2024-04-24 1505  WBC 5.6  NEUTROABS 4.7  HGB 12.3  HCT 37.6  MCV 86.8  PLT 190   Basic Metabolic Panel:  Lab Results  Component Value Date   NA 139 24-Apr-2024   K 3.8 2024-04-24   CO2 27 04-24-24   GLUCOSE 299 (H) 2024-04-24   BUN 20 04-24-24   CREATININE 1.04 (H) 04-24-24   CALCIUM  9.3 04/24/2024   GFRNONAA 56 (L) 04-24-2024   GFRAA >60 03/27/2017   Lipid Panel:  Lab Results  Component Value Date   LDLCALC 89 04/14/2024    HgbA1c:  Lab Results  Component Value Date   HGBA1C 8.0 (H) 24-Apr-2024   Urine Drug Screen: No results found for: LABOPIA, COCAINSCRNUR, LABBENZ, AMPHETMU, THCU, LABBARB  Alcohol Level     Component Value Date/Time   ETH <15 24-Apr-2024 1505   INR  Lab Results  Component Value Date   INR 1.0 04-24-2024   APTT  Lab Results  Component Value Date   APTT 24 April 24, 2024   AED levels: No results found for: PHENYTOIN, ZONISAMIDE, LAMOTRIGINE, LEVETIRACETA  MRI brain 1. 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 Mild cytotoxic edema, and also faint post-ischemic enhancement, with no hemorrhagic transformation or mass effect. 2. No metastatic disease identified. 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 3. Underlying moderately advanced chronic small vessel disease.   CTA H&N 1. Negative CTA for large vessel occlusion or other emergent finding. 2. Atheromatous plaque about the right carotid bulb with estimated 50-60% stenosis. 3. Severe atheromatous stenosis at the origin of the dominant left vertebral artery. 4. Atheromatous change about the carotid siphons with associated moderate stenoses bilaterally. 5. Focal moderate stenosis at the right P1-P2 junction. 6. Previously identified small right cerebral infarcts are not well visualized by CT. No other new acute intracranial abnormality.   Stroke Labs   Labs (Brief)   Component Value Date/Time   CHOL 140 04/14/2024 0521   TRIG 69 04/14/2024 0521   HDL 37 (L) 04/14/2024 0521   CHOLHDL 3.8 04/14/2024 0521   VLDL 14 04/14/2024 0521   LDLCALC 89 04/14/2024 0521      Labs (Brief)    ASSESSMENT   Terri [name] is a 73 y.o. female with hx of HTN, HL, DM2, endometrial cancer IIIb who presents with mild L sided weakness that has since resolved. Outpatient MRI brain shows acute,  subacute, and chronic infarcts layered onto the R MCA territory and she was referred to ED for stroke workup. Etiology of stroke is symptomatic R ICA stenosis. Vascular surgery will see her this afternoon. Due to staffing issues TTE will not be able to be performed today. Since I am not concerned that her stroke is cardioembolic I don't feel that she needs to stay an extra night just to complete this.   RECOMMENDATIONS   - ASA 81mg  daily + plavix  75mg  daily x90 days f/b ASA 81mg  daily monotherapy after that - Continue atorvastatin  80mg  day - Continue rehab - Vascular surgery consult for symptomatic R ICA stenosis - I will set up outpatient neurology f/u  Neurology will be available prn for questions going forward.  ______________________________________________________________________    Signed, Elida CHRISTELLA Ross, MD [facility]     [  1] No Known Allergies [2] No current facility-administered medications for this encounter.  Current Outpatient Medications:    albuterol  (VENTOLIN  HFA) 108 (90 Base) MCG/ACT inhaler, Inhale 2 puffs into the lungs every 6 (six) hours as needed for wheezing or shortness of breath., Disp: 1 each, Rfl: 1   amLODipine  (NORVASC ) 10 MG tablet, Take 1 tablet (10 mg total) by mouth daily., Disp: 90 tablet, Rfl: 3   aspirin  EC 81 MG tablet, Take 81 mg by mouth daily. Swallow whole., Disp: , Rfl:    atorvastatin  (LIPITOR) 80 MG tablet, Take 1 tablet (80 mg total) by mouth daily., Disp: 90 tablet, Rfl: 3   budesonide -glycopyrrolate -formoterol  (BREZTRI ) 160-9-4.8 MCG/ACT AERO inhaler, Inhale 2 puffs into the lungs 2 (two) times daily. Via PAP AZ&Me, Disp: , Rfl:    buPROPion  (WELLBUTRIN  XL) 300 MG 24 hr tablet, Take 1 tablet (300 mg total) by mouth daily., Disp: 90 tablet, Rfl: 0   Cholecalciferol  (VITAMIN D3) 50 MCG (2000 UT) capsule, Take 2,000 Units by mouth daily., Disp: , Rfl:    citalopram  (CELEXA ) 20 MG tablet, Take 1 tablet (20 mg total) by mouth  daily., Disp: 90 tablet, Rfl: 3   cloNIDine  (CATAPRES ) 0.2 MG tablet, Take 1 tablet (0.2 mg total) by mouth 2 (two) times daily., Disp: 180 tablet, Rfl: 3   cyanocobalamin  (VITAMIN B12) 1000 MCG tablet, Take 1 tablet (1,000 mcg total) by mouth daily., Disp: 90 tablet, Rfl: 3   insulin  aspart (NOVOLOG ) 100 UNIT/ML injection, Inject 8 Units into the skin 3 (three) times daily before meals., Disp: , Rfl:    insulin  degludec (TRESIBA  FLEXTOUCH) 100 UNIT/ML FlexTouch Pen, Inject 20 Units into the skin daily. 20 units once daily - REPLACES Levemir , Disp: , Rfl:    losartan -hydrochlorothiazide  (HYZAAR) 100-25 MG tablet, Take 1 tablet by mouth daily., Disp: 90 tablet, Rfl: 3   metoprolol  succinate (TOPROL -XL) 50 MG 24 hr tablet, Take 1 tablet (50 mg total) by mouth daily., Disp: 90 tablet, Rfl: 0   ondansetron  (ZOFRAN ) 8 MG tablet, Take 1 tablet (8 mg total) by mouth every 8 (eight) hours as needed for nausea or vomiting., Disp: 30 tablet, Rfl: 0   clopidogrel  (PLAVIX ) 75 MG tablet, Take 1 tablet (75 mg total) by mouth daily for 21 days., Disp: 21 tablet, Rfl: 0   megestrol  (MEGACE ) 40 MG tablet, Take 2 tablets (80 mg total) by mouth 2 (two) times daily., Disp: 120 tablet, Rfl: 1   montelukast  (SINGULAIR ) 10 MG tablet, Take 10 mg by mouth at bedtime. (Patient not taking: Reported on 04/13/2024), Disp: , Rfl:

## 2024-04-21 ENCOUNTER — Ambulatory Visit: Admitting: Physical Therapy

## 2024-04-21 ENCOUNTER — Telehealth: Payer: Self-pay | Admitting: Pharmacist

## 2024-04-21 DIAGNOSIS — E118 Type 2 diabetes mellitus with unspecified complications: Secondary | ICD-10-CM

## 2024-04-21 MED ORDER — GLUCOSE BLOOD VI STRP: ORAL_STRIP | 12 refills | Status: AC

## 2024-04-21 NOTE — Telephone Encounter (Signed)
 Reached out to Patient and she misplaced application - will remail to home address.

## 2024-04-21 NOTE — Telephone Encounter (Signed)
 Reached out to patient and she misplaced application-will re-mail to home address.

## 2024-04-21 NOTE — Therapy (Deleted)
 " OUTPATIENT PHYSICAL THERAPY TREATMENT  Patient Name: Terri [name] MRN: [id number] DOB:03/28/1951, 73 y.o., female Today's Date: 04/21/2024  PCP: Gretel App, NP  REFERRING PROVIDER: Thomasena Sherran BROCKS, PA-C  END OF SESSION:      Past Medical History:  Diagnosis Date   Adenomatous colon polyp    Aortic stenosis    Arthritis    Asthma    Cancer (HCC)    Claudication    COPD (chronic obstructive pulmonary disease) (HCC)    Depression    Diabetes mellitus without complication (HCC)    Esophageal dysphagia    GERD (gastroesophageal reflux disease)    Hyperlipemia    Hypertension    Obesity    Severe obesity (BMI 35.0-35.9 with comorbidity) (HCC)    Past Surgical History:  Procedure Laterality Date   BLADDER SURGERY     COLONOSCOPY WITH PROPOFOL  N/A 08/05/2015   Procedure: COLONOSCOPY WITH PROPOFOL ;  Surgeon: Donnice Vaughn Manes, MD;  Location: [facility] ENDOSCOPY;  Service: Endoscopy;  Laterality: N/A;   ESOPHAGOGASTRODUODENOSCOPY N/A 01/09/2022   Procedure: ESOPHAGOGASTRODUODENOSCOPY (EGD);  Surgeon: Maryruth Ole DASEN, MD;  Location: [facility] ENDOSCOPY;  Service: Endoscopy;  Laterality: N/A;   FOOT SURGERY Left    FRACTURE SURGERY     IR CV LINE INJECTION  06/22/2022   IR IMAGING GUIDED PORT INSERTION  06/05/2022   IR PORT REPAIR CENTRAL VENOUS ACCESS DEVICE  07/02/2022   LOWER EXTREMITY ANGIOGRAPHY Left 02/04/2023   Procedure: Lower Extremity Angiography;  Surgeon: Marea Selinda RAMAN, MD;  Location: [facility] INVASIVE CV LAB;  Service: Cardiovascular;  Laterality: Left;   ORIF ANKLE FRACTURE Left 03/26/2017   Procedure: OPEN REDUCTION INTERNAL FIXATION (ORIF) ANKLE FRACTURE;  Surgeon: Edie Norleen PARAS, MD;  Location: [facility] ORS;  Service: Orthopedics;  Laterality: Left;   Patient Active Problem List   Diagnosis Date Noted   Stroke (HCC) 04/13/2024   Chronic kidney disease, stage 3a (HCC) 04/13/2024   Depression 04/13/2024   Overweight (BMI 25.0-29.9) 04/13/2024   Fall at home, initial  encounter 04/13/2024   Type II diabetes mellitus with renal manifestations (HCC) 04/13/2024   Meningioma (HCC) 04/13/2024   Neuropathy 02/04/2024   Decreased hearing of both ears 02/04/2024   Rash 07/01/2023   Muscle strain of chest wall 07/01/2023   Peripheral vascular disease 05/14/2023   Personal history of fall 04/28/2023   COPD exacerbation (HCC) 03/29/2023   Dyspnea on exertion 02/02/2023   Vitamin D  deficiency 02/02/2023   Vitamin B 12 deficiency 02/02/2023   Diabetic eye exam (HCC) 02/02/2023   Aortic atherosclerosis 02/02/2023   Bradycardia 01/14/2023   Anemia 09/28/2022   Postmenopausal bleeding 01/30/2022   Endometrial cancer (HCC) 01/25/2022   Mild aortic stenosis 10/12/2020   Acute respiratory failure with hypoxia (HCC) 08/05/2015   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   Class 2 severe obesity due to excess calories with serious comorbidity in adult 08/19/2013   Obesity, Class II, BMI 35-39.9, with comorbidity 08/19/2013   Chronic obstructive pulmonary disease, unspecified (HCC) 07/31/2012   Asthma with chronic obstructive pulmonary disease (COPD) (HCC) 07/31/2012   Atherosclerosis of native arteries of extremity with intermittent claudication 03/12/2012   Tobacco use 03/12/2012   Hyperlipidemia associated with type 2 diabetes mellitus (HCC) 06/06/2011   Hypertension associated with diabetes (HCC) 06/06/2011   Mood disorder 06/06/2011   Type 2 diabetes with complication (HCC) 06/06/2011    ONSET DATE: it started when I was a kid - 2003 for imbalance  REFERRING DIAG:  R26.89 (ICD-10-CM) - Imbalance  G62.9 (ICD-10-CM) -  Neuropathy   THERAPY DIAG:  No diagnosis found.  Rationale for Evaluation and Treatment: Rehabilitation  SUBJECTIVE:                                                                                                                                                                                             SUBJECTIVE STATEMENT:   Pt states she did not  sleep well the previous night, and on arrival was asleep in transport chair. She states she has been having some pain on her left side after a fall a few months ago.    PERTINENT HISTORY:   Of note, pt also undergoing treatment for progressive endometrial cancer with cervical and vaginal involvement.   Per neurology note 01/20/24: Patient with PMH significant for hypertension, hyperlipidemia, diabetes, endometrial cancer (currently undergoing treatment). Patient presents for follow-up with ongoing imbalance and weakness. She did not start PT following last office visit given transportation issues. She is no longer taking gabapentin, however she is taking Lyrica 150 mg twice daily per external provider. Patient has previously been diagnosed with chronic severe sensorimotor polyneuropathy as well as advanced lumbar spine degeneration. Again offered referral to physiatry, patient defers at this time Again discussed referral to outpatient physical therapy for gait and balance training, however patient wishes to hold off at this time. Continue Lyrica 150 mg twice daily per external provider.    PAIN:  Are you having pain? No pain at rest, but stops walking/standing several times in session due to Left knee pain (non during STS exercises)   PRECAUTIONS: Fall and Other: pt reporting having port for chemo  WEIGHT BEARING RESTRICTIONS: No  FALLS: Has patient fallen in last 6 months? Pt unsure of when last fall was, estimates to be about last year. Pt notes that she did slip the other day where she knocked her bookcase over; pt reports no injuries.   LIVING ENVIRONMENT: Lives with: lives alone Lives in: House/apartment - town house Stairs: Yes: Internal: ~12 steps; on right going up and External: 3 steps; on right going up Has following equipment at home: Counselling psychologist, Walker - 2 wheeled, shower chair, and Grab bars  PLOF: Independent with basic ADLs  PATIENT GOALS: walk normally    OBJECTIVE:  Note: Objective measures were completed at Evaluation unless otherwise noted.  DIAGNOSTIC FINDINGS:  Per neurology note 01/20/24: 10/08/2023 EMG IMPRESSION: There is electrodiagnostic evidence of severe sensorimotor polyneuropathy in the lower extremities.  09/06/2023 CT LUMBAR SPINE WO IMPRESSION:  1. No acute osseous abnormality in the Lumbar Spine. Chronic L1  compression fracture, lower thoracic hyperostosis related interbody  ankylosis including  at T12-L1.  2. Advanced lumbar spine degeneration L3-L4 through L5-S1. Moderate  multifactorial spinal stenosis suspected at L3-L4, Mild at L4-L5.  Moderate bilateral L3, moderate to severe bilateral L4, and very  severe bilateral L5 nerve level foraminal stenoses.  3. Aortic Atherosclerosis (ICD10-I70.0).   06/05/2023 CT HEAD AND C SPINE WO CONTRAST IMPRESSION:  1. No acute intracranial pathology. Small-vessel white matter  disease.  2. Soft tissue contusion and hematoma of the right forehead.  3. No fracture or static subluxation of the cervical spine.  4. Moderate multilevel cervical disc degenerative disease.   12/19/2022 CT HEAD AND C SPINE WO CONTRAST IMPRESSION:  1. No acute intracranial abnormality.  2. Right orbital floor fracture with herniation of the inferior  rectus muscle body through the fracture site. Findings can be seen  in the setting of entrapment. Recommend ophthalmologic consultation.  3. Air in the intraconal compartment of the orbit abutting the right  optic nerve. No evidence of an intraorbital hematoma.  4. No acute fracture or traumatic subluxation of the cervical spine.  5. Subcutaneous emphysema that extends inferiorly to the level of  the thyroid  and superiorly to the level of the periorbital soft  tissues. This may be secondary to an additional occult maxillary  sinus injury, possibly along the anterior wall.    COGNITION: Overall cognitive status: pt reporting difficulty with  memory following recent chemo; unable to provide clear timeline of recent falls/stumbles   GAIT: Findings: Gait Characteristics: decreased stride length and narrow BOS, Distance walked: distance required for assessment, Assistive device utilized:Walker - 2 wheeled, and Level of assistance: CGA and Min A  FUNCTIONAL TESTS:  5 times sit to stand: 16.57 with RW for steadying, decreased eccentric control Timed up and go (TUG): to be assessed 3 minute walk test: to be assessed 10 meter walk test:  0.24 with RW, CGA-minA for posterior/R lateral LOBs   PATIENT SURVEYS:  ABC scale 11/5: 46%                                                                                                                             TREATMENT DATE 04/21/2024:   TA- To improve functional movements patterns for everyday tasks   Transfer with CGA to nustep from transport chair  Nustep SPM setting for visual feedback for speed, frequent reminder to keep pace x 5 min performed in bouts of 2 minutes with 1 minute rest Level 1  -Pt abruptly stopped at 5 minutes stating she was too fatigued to continue  Gait to chair - CGA, with RW performed between seated activities 3 x ~67ft   Gait around clinic with RW, navigating obstacles including chairs, desks, and machines 2 x ~ 68ft  -Seated rest provided between sets  TE- To improve strength, endurance, mobility, and function of specific targeted muscle groups or improve joint range of motion or improve muscle flexibility   Seated march with 2# AW 2 x 10- pt requires verbal,  tactile, and visual cues to maintain knee flexion height.   Seated heel raises 2# AW - 3 x 12 - cues and visual demo for B movement and full ROM.   Seated toes raise  3 x 12 reps, similar cues   NMR: To facilitate reeducation of movement, balance, posture, coordination, and/or proprioception/kinesthetic sense.   Standing narrow stance balance on firm surface 2 x 30 sec attempts- pt demonstrates  increased sway, one lateral LOB, and frequent UE support   Unless otherwise stated, CGA was provided and gait belt donned in order to ensure pt safety   Pt required occasional rest breaks due fatigue, PT was attentive to when pt appeared to be tired or winded in order to prevent excessive fatigue.   PATIENT EDUCATION: Education details: Heavy cues throughout, instruction that we will avoid knee pain and exacerbations as much as we are able.  Person educated: Patient Education method: Explanation and Verbal cues Education comprehension: verbalized understanding and needs further education  HOME EXERCISE PROGRAM: Access Code: [email or web address] URL: [email or web address] Date: 03/06/2024 Prepared by: Lonni Gainer  Exercises - Sit to stand with counter or walker support in front of you  - 1 x daily - 7 x weekly - 2 sets - 6 reps - Seated March  - 1 x daily - 7 x weekly - 2 sets - 10 reps - Seated Long Arc Quad  - 1 x daily - 7 x weekly - 2 sets - 10 reps  GOALS: Goals reviewed with patient? Yes  SHORT TERM GOALS: Target date: 04/15/24  Patient will be independent with home exercise program to improve strength/mobility for increased functional independence with ADLs and mobility. Baseline: to be assessed Goal status: INITIAL   LONG TERM GOALS: Target date: 05/27/24  Patient (> 40 years old) will complete five times sit to stand test (5XSTS) in < 15 seconds indicating an increased LE strength and improved balance. Baseline: 16.57 seconds BUE support at walker upon standing, decreased eccentric control Goal status: INITIAL  2.  Patient will increase Berg Balance score by > 6 points to demonstrate decreased fall risk during functional activities. Baseline: 25/56 Goal status: INITIAL  3.  Patient will increase 3 minute walk test distance to >371ft for progression to community level ambulation, demonstrating improved gait endurance. Baseline: Completed 2:17 min; 172ft Goal  status: INITIAL  4.  Patient will increase 10 meter walk test to >0.6 m/s as to improve gait speed for better community ambulation and to reduce fall risk. Baseline: 0.24 m/s with RW, CGA-minA for posterior/R lateral LOBs Goal status: INITIAL  5.  Patient will reduce timed up and go to <15 seconds to reduce fall risk and demonstrate improved transfer/gait ability. Baseline: 20.77sec Goal status: INITIAL  6.  Patient will improve ABC scale score  by >20% to demonstrate increased confidence with functional mobility and ADLs Baseline: 46.25% Goal status: INITIAL  ASSESSMENT:  CLINICAL IMPRESSION: Pt arrived with good motivation for completion of PT activities. Pt began on nustep at decreased resistance, attempted three minutes but pt only able to tolerate 2 minutes before requiring a rest. Pt unable to complete 6 minutes and abruptly stopped at 5 min stating fatigue. All seated exercises require heavy verbal, tactile and visual cueing. Overall, this session focused on increased gait tolerance. Pt did not state any knee pain at this session, but would frequently stop activity relative to fatigue. Despite fatigue pt did well navigating obstacles in the clinic for short bouts of gait. Throughout session pt  was encouraged to complete HEP and was provided a new print out of previous exercises. End of session, pt was provided new end caps for RW for safer and quiter ambulation. Patient will benefit from skilled physical therapy intervention to reduce deficits and impairments identified in evaluation, in order to reduce pain, improve quality of life, and maximize activity tolerance for ADL, IADL, and leisure/fitness. Physical therapy will help pt achieve long and short term goals of care.      OBJECTIVE IMPAIRMENTS: Abnormal gait, decreased activity tolerance, decreased balance, decreased coordination, decreased endurance, decreased knowledge of condition, decreased mobility, difficulty walking, decreased  strength, and decreased safety awareness.   ACTIVITY LIMITATIONS: carrying, lifting, stairs, transfers, and locomotion level  PARTICIPATION LIMITATIONS: meal prep, cleaning, laundry, shopping, and community activity  PERSONAL FACTORS: Age, Sex, and 3+ comorbidities: CA, HTN, COPD, DM, depression are also affecting patient's functional outcome.   REHAB POTENTIAL: Fair    CLINICAL DECISION MAKING: Evolving/moderate complexity  EVALUATION COMPLEXITY: Moderate  PLAN:  PT FREQUENCY: 1-2x/week  PT DURATION: 12 weeks  PLANNED INTERVENTIONS: 97164- PT Re-evaluation, 97750- Physical Performance Testing, 97110-Therapeutic exercises, 97530- Therapeutic activity, 97112- Neuromuscular re-education, 97535- Self Care, 02859- Manual therapy, [phone]- Gait training, [phone]- Canalith repositioning, Patient/Family education, Balance training, Stair training, Joint mobilization, Vestibular training, Cryotherapy, and Moist heat  PLAN FOR NEXT SESSION:  -Assess stair navigation -Continue to provide education on deficits, promote safety awareness & use of AD -concerned about pt ability to safely come to PT here, but will continue to manage over next few sessions -Monitor knee pain (most limiting per pt with standing exercises) -Standing balance as tolerated  Note: Portions of this document were prepared using Dragon voice recognition software and although reviewed may contain unintentional dictation errors in syntax, grammar, or spelling.   Lonni KATHEE Gainer, PT 04/21/2024 7:56 AM   "

## 2024-04-21 NOTE — Progress Notes (Signed)
 Brief Telephone Documentation Reason for Call: Patient left message regarding question for pharmacist   Recent hospitalization for MRI in the setting of dizziness/falls, recommended by her oncologist. Imaging suggestive of multiple stoke.   Discharge summary notes hyperglycemia with need for further insulin  tirration.  ____________________________________________________  Summary of Call  Notes she is finished with chemotherapy and has seen a large shift in sugars since this point (was fairly well controlled through all of chemo). Her largest concerns is she has regained her appetite and has started re-gaining weight. Asks about resuming Ozempic  (prior she has self-discontinued due to preference to avoid further weight loss while on chemo).   Reported Regimen:  Tresiba  20 units daily  Novolog  8 units (TIDAC - back to eating 3 meals per day)   SMBG: Libre 3+ Notes she broke 3 sensors trying to put them on and feels that the sensors may be too difficult for her to use at this time. Is open to using them again but for now prefers to stick with glucometer but is out of test strips.   Needs test strips (Reports her current meter/test strips = OneTouch Verio)  No s/sx hypoglycemia.  No recent sugar checks.    Assessment/Plan:  Diabetes, Type 2: Uncontrolled per A1c at hospitalization 8.0% (12/15), increased from previous 6.2% which she attributes to finishing chemotherapy resulting in appetite return to prior baseline. No SMBG given issues with Libre, and no test strips for glucometer.  Refill for OneTouch Verio test strip pended to PCP - start checking sugars at least every morning (fasting)  Msg sent to PCP regarding hospital f/u. Is scheduled for general f/u in 2 weeks  Future Consideration: GLP1: Ozempic  no longer available through PAP. Trulicity = reasonable option though would take at least a few weeks for PAP application/approval. In the short-term will work on insulin  titration as  needed.   Follow Up: F/u 1 week to review home sugar readings/insulin  titration  Patient given direct line for further questions/concerns.  Future Appointments  Date Time Provider Department Center  04/27/2024 10:30 AM [facility] PHARMACIST [facility] [address]  04/28/2024  9:30 AM [facility] Lonni NOVAK, PT [facility] None  05/01/2024 10:15 AM Dew, Selinda RAMAN, MD [facility] None  05/05/2024  9:30 AM [facility] Lonni NOVAK, PT [facility] None  05/07/2024 10:40 AM Gretel App, NP [facility] [address] Manuelita FABIENE Geronimo, PharmD Clinical Pharmacist [facility] [phone]

## 2024-04-22 ENCOUNTER — Encounter: Payer: Self-pay | Admitting: Pharmacist

## 2024-04-22 NOTE — Progress Notes (Signed)
 Patient Assistance Program (PAP) Application   Manufacturer: Secretary/administrator    (New enrollment) Medication(s): Trulicity (new medication 2026, not yet started)  Patient Portion of Application:  04/22/24: Completed with patient via online enrollment tool. Submitted.  Income Documentation: N/A - Electronic verification elected. Application ID: [id number]  Provider Portion of Application:  04/22/24: Provider portion completed by PharmD and uploaded PCP eFax folder for signature.  Prescription(s): Included in MAP application.    Next Steps: []    Upon PCP signature Application to be faxed to: [facility] Medication Advocate Team: [phone] AND scanned into patient chart  Note routed to PCP Clinic Pool to ensure PCP signature is obtained and application is faxed. [facility] PAP spreadsheet updated

## 2024-04-24 ENCOUNTER — Telehealth: Payer: Self-pay

## 2024-04-24 NOTE — Telephone Encounter (Signed)
 PAP: Patient assistance application for Trulicity through [employer] has been mailed to pt's home address on file. Provider portion of application will be faxed to provider's office. Provider portion received. Patient portion submitted on line- will submit provider portion .

## 2024-04-27 ENCOUNTER — Other Ambulatory Visit (INDEPENDENT_AMBULATORY_CARE_PROVIDER_SITE_OTHER): Admitting: Pharmacist

## 2024-04-27 ENCOUNTER — Other Ambulatory Visit

## 2024-04-27 DIAGNOSIS — E118 Type 2 diabetes mellitus with unspecified complications: Secondary | ICD-10-CM

## 2024-04-27 MED ORDER — LANCETS MISC: 11 refills | Status: AC

## 2024-04-27 MED ORDER — BLOOD GLUCOSE MONITORING SUPPL DEVI: 0 refills | Status: AC

## 2024-04-27 NOTE — Progress Notes (Signed)
 Brief Telephone Documentation Reason for Call: Blood Sugar Monitoring  Summary of Call: Patient notes she did not pick up new test strips at the pharmacy. Her pharmacist at [facility] recommended she wait until 1/1 as it appears her current insurance plan has ended (New HTA plan coverage starts 04/30/24).   Denies s/sx low sugars.  Denies s/sx hyperglycemia. No polyuria, polyphagia, polydipsia. Denies blurred vision.   No new concerns since phone follow up last week.  Unrelated, reports diarrhea with megestrol  last night. Denies any upper GI concerns. Diarrhea self-resolved within 24 hours though has supportive medications prescribed by Onc as needed.  Continue to self-monitor GI upset (prior discussion of Trulicity in the new year once approved for PAP)  Plan: Preferred Glucometer [employer] HTA 2026 ($0): AccuChek Contour  Insurance will cover new glucometer system and all testing supplies for $0 as long as a covered brand above. Rx pended to PCP with start date: 04/30/24 or after.   Reminder set 05/01/24 to follow up with [facility] to ensure glucometer billing successful through new HTA insurance.  _____________________________ 2026 Insurance Benefit Patient reports all but 1 or 2 providers are through [facility]. Is interested in continuing neurology/post-stroke care with [facility]. Scheduled with vasc neurosurg 05/01/24.  In the new year, consider current PPO I plan vs Diabetes/Heart Care plan if medication cost is an issue for heart/diabetes meds (election for [employer] plans through ~March).       Follow Up: Patient given direct line for further questions/concerns. Reminder set 05/01/24 -  f/u with [facility] regarding glucometer coverage   [name], PharmD Clinical Pharmacist [facility] [phone]

## 2024-04-28 ENCOUNTER — Telehealth: Payer: Self-pay

## 2024-04-28 ENCOUNTER — Ambulatory Visit: Admitting: Physical Therapy

## 2024-04-28 NOTE — Therapy (Deleted)
 " OUTPATIENT PHYSICAL THERAPY TREATMENT  Patient Name: Terri [name] MRN: [id number] DOB:[birth year], 73 y.o., female Today's Date: 04/28/2024  PCP: Gretel App, NP  REFERRING PROVIDER: Thomasena Sherran BROCKS, PA-C  END OF SESSION:      Past Medical History:  Diagnosis Date   Adenomatous colon polyp    Aortic stenosis    Arthritis    Asthma    Cancer (HCC)    Claudication    COPD (chronic obstructive pulmonary disease) (HCC)    Depression    Diabetes mellitus without complication (HCC)    Esophageal dysphagia    GERD (gastroesophageal reflux disease)    Hyperlipemia    Hypertension    Obesity    Severe obesity (BMI 35.0-35.9 with comorbidity) (HCC)    Past Surgical History:  Procedure Laterality Date   BLADDER SURGERY     COLONOSCOPY WITH PROPOFOL  N/A 08/05/2015   Procedure: COLONOSCOPY WITH PROPOFOL ;  Surgeon: Donnice Vaughn Manes, MD;  Location: [facility] ENDOSCOPY;  Service: Endoscopy;  Laterality: N/A;   ESOPHAGOGASTRODUODENOSCOPY N/A 01/09/2022   Procedure: ESOPHAGOGASTRODUODENOSCOPY (EGD);  Surgeon: Maryruth Ole DASEN, MD;  Location: [facility] ENDOSCOPY;  Service: Endoscopy;  Laterality: N/A;   FOOT SURGERY Left    FRACTURE SURGERY     IR CV LINE INJECTION  06/22/2022   IR IMAGING GUIDED PORT INSERTION  06/05/2022   IR PORT REPAIR CENTRAL VENOUS ACCESS DEVICE  07/02/2022   LOWER EXTREMITY ANGIOGRAPHY Left 02/04/2023   Procedure: Lower Extremity Angiography;  Surgeon: Marea Selinda RAMAN, MD;  Location: [facility] INVASIVE CV LAB;  Service: Cardiovascular;  Laterality: Left;   ORIF ANKLE FRACTURE Left 03/26/2017   Procedure: OPEN REDUCTION INTERNAL FIXATION (ORIF) ANKLE FRACTURE;  Surgeon: Edie Norleen PARAS, MD;  Location: [facility] ORS;  Service: Orthopedics;  Laterality: Left;   Patient Active Problem List   Diagnosis Date Noted   Stroke (HCC) 04/13/2024   Chronic kidney disease, stage 3a (HCC) 04/13/2024   Depression 04/13/2024   Overweight (BMI 25.0-29.9) 04/13/2024   Fall at home, initial  encounter 04/13/2024   Type II diabetes mellitus with renal manifestations (HCC) 04/13/2024   Meningioma (HCC) 04/13/2024   Neuropathy 02/04/2024   Decreased hearing of both ears 02/04/2024   Rash 07/01/2023   Muscle strain of chest wall 07/01/2023   Peripheral vascular disease 05/14/2023   Personal history of fall 04/28/2023   COPD exacerbation (HCC) 03/29/2023   Dyspnea on exertion 02/02/2023   Vitamin D  deficiency 02/02/2023   Vitamin B 12 deficiency 02/02/2023   Diabetic eye exam (HCC) 02/02/2023   Aortic atherosclerosis 02/02/2023   Bradycardia 01/14/2023   Anemia 09/28/2022   Postmenopausal bleeding 01/30/2022   Endometrial cancer (HCC) 01/25/2022   Mild aortic stenosis 10/12/2020   Acute respiratory failure with hypoxia (HCC) 08/05/2015   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   Class 2 severe obesity due to excess calories with serious comorbidity in adult 08/19/2013   Obesity, Class II, BMI 35-39.9, with comorbidity 08/19/2013   Chronic obstructive pulmonary disease, unspecified (HCC) 07/31/2012   Asthma with chronic obstructive pulmonary disease (COPD) (HCC) 07/31/2012   Atherosclerosis of native arteries of extremity with intermittent claudication 03/12/2012   Tobacco use 03/12/2012   Hyperlipidemia associated with type 2 diabetes mellitus (HCC) 06/06/2011   Hypertension associated with diabetes (HCC) 06/06/2011   Mood disorder 06/06/2011   Type 2 diabetes with complication (HCC) 06/06/2011    ONSET DATE: it started when I was a kid - 2003 for imbalance  REFERRING DIAG:  R26.89 (ICD-10-CM) - Imbalance  G62.9 (ICD-10-CM) -  Neuropathy   THERAPY DIAG:  Difficulty in walking, not elsewhere classified  Muscle weakness (generalized)  History of falling  Unsteadiness on feet  Rationale for Evaluation and Treatment: Rehabilitation  SUBJECTIVE:                                                                                                                                                                                              SUBJECTIVE STATEMENT:   Pt states she did not sleep well the previous night, and on arrival was asleep in transport chair. She states she has been having some pain on her left side after a fall a few months ago.    PERTINENT HISTORY:   Of note, pt also undergoing treatment for progressive endometrial cancer with cervical and vaginal involvement.   Per neurology note 01/20/24: Patient with PMH significant for hypertension, hyperlipidemia, diabetes, endometrial cancer (currently undergoing treatment). Patient presents for follow-up with ongoing imbalance and weakness. She did not start PT following last office visit given transportation issues. She is no longer taking gabapentin, however she is taking Lyrica 150 mg twice daily per external provider. Patient has previously been diagnosed with chronic severe sensorimotor polyneuropathy as well as advanced lumbar spine degeneration. Again offered referral to physiatry, patient defers at this time Again discussed referral to outpatient physical therapy for gait and balance training, however patient wishes to hold off at this time. Continue Lyrica 150 mg twice daily per external provider.    PAIN:  Are you having pain? No pain at rest, but stops walking/standing several times in session due to Left knee pain (non during STS exercises)   PRECAUTIONS: Fall and Other: pt reporting having port for chemo  WEIGHT BEARING RESTRICTIONS: No  FALLS: Has patient fallen in last 6 months? Pt unsure of when last fall was, estimates to be about last year. Pt notes that she did slip the other day where she knocked her bookcase over; pt reports no injuries.   LIVING ENVIRONMENT: Lives with: lives alone Lives in: House/apartment - town house Stairs: Yes: Internal: ~12 steps; on right going up and External: 3 steps; on right going up Has following equipment at home: Counselling psychologist, Walker - 2 wheeled,  shower chair, and Grab bars  PLOF: Independent with basic ADLs  PATIENT GOALS: walk normally   OBJECTIVE:  Note: Objective measures were completed at Evaluation unless otherwise noted.  DIAGNOSTIC FINDINGS:  Per neurology note 01/20/24: 10/08/2023 EMG IMPRESSION: There is electrodiagnostic evidence of severe sensorimotor polyneuropathy in the lower extremities.  09/06/2023 CT LUMBAR SPINE WO IMPRESSION:  1. No acute osseous abnormality in the  Lumbar Spine. Chronic L1  compression fracture, lower thoracic hyperostosis related interbody  ankylosis including at T12-L1.  2. Advanced lumbar spine degeneration L3-L4 through L5-S1. Moderate  multifactorial spinal stenosis suspected at L3-L4, Mild at L4-L5.  Moderate bilateral L3, moderate to severe bilateral L4, and very  severe bilateral L5 nerve level foraminal stenoses.  3. Aortic Atherosclerosis (ICD10-I70.0).   06/05/2023 CT HEAD AND C SPINE WO CONTRAST IMPRESSION:  1. No acute intracranial pathology. Small-vessel white matter  disease.  2. Soft tissue contusion and hematoma of the right forehead.  3. No fracture or static subluxation of the cervical spine.  4. Moderate multilevel cervical disc degenerative disease.   12/19/2022 CT HEAD AND C SPINE WO CONTRAST IMPRESSION:  1. No acute intracranial abnormality.  2. Right orbital floor fracture with herniation of the inferior  rectus muscle body through the fracture site. Findings can be seen  in the setting of entrapment. Recommend ophthalmologic consultation.  3. Air in the intraconal compartment of the orbit abutting the right  optic nerve. No evidence of an intraorbital hematoma.  4. No acute fracture or traumatic subluxation of the cervical spine.  5. Subcutaneous emphysema that extends inferiorly to the level of  the thyroid  and superiorly to the level of the periorbital soft  tissues. This may be secondary to an additional occult maxillary  sinus injury, possibly  along the anterior wall.    COGNITION: Overall cognitive status: pt reporting difficulty with memory following recent chemo; unable to provide clear timeline of recent falls/stumbles   GAIT: Findings: Gait Characteristics: decreased stride length and narrow BOS, Distance walked: distance required for assessment, Assistive device utilized:Walker - 2 wheeled, and Level of assistance: CGA and Min A  FUNCTIONAL TESTS:  5 times sit to stand: 16.57 with RW for steadying, decreased eccentric control Timed up and go (TUG): to be assessed 3 minute walk test: to be assessed 10 meter walk test:  0.24 with RW, CGA-minA for posterior/R lateral LOBs   PATIENT SURVEYS:  ABC scale 11/5: 46%                                                                                                                             TREATMENT DATE 04/28/2024:   TA- To improve functional movements patterns for everyday tasks   Transfer with CGA to nustep from transport chair  Nustep SPM setting for visual feedback for speed, frequent reminder to keep pace x 5 min performed in bouts of 2 minutes with 1 minute rest Level 1  -Pt abruptly stopped at 5 minutes stating she was too fatigued to continue  Gait to chair - CGA, with RW performed between seated activities 3 x ~85ft   Gait around clinic with RW, navigating obstacles including chairs, desks, and machines 2 x ~ 63ft  -Seated rest provided between sets  TE- To improve strength, endurance, mobility, and function of specific targeted muscle groups or improve joint range of motion or improve  muscle flexibility   Seated march with 2# AW 2 x 10- pt requires verbal, tactile, and visual cues to maintain knee flexion height.   Seated heel raises 2# AW - 3 x 12 - cues and visual demo for B movement and full ROM.   Seated toes raise  3 x 12 reps, similar cues   NMR: To facilitate reeducation of movement, balance, posture, coordination, and/or proprioception/kinesthetic  sense.   Standing narrow stance balance on firm surface 2 x 30 sec attempts- pt demonstrates increased sway, one lateral LOB, and frequent UE support   Unless otherwise stated, CGA was provided and gait belt donned in order to ensure pt safety   Pt required occasional rest breaks due fatigue, PT was attentive to when pt appeared to be tired or winded in order to prevent excessive fatigue.   PATIENT EDUCATION: Education details: Heavy cues throughout, instruction that we will avoid knee pain and exacerbations as much as we are able.  Person educated: Patient Education method: Explanation and Verbal cues Education comprehension: verbalized understanding and needs further education  HOME EXERCISE PROGRAM: Access Code: [email or web address] URL: [email or web address] Date: 03/06/2024 Prepared by: Lonni Gainer  Exercises - Sit to stand with counter or walker support in front of you  - 1 x daily - 7 x weekly - 2 sets - 6 reps - Seated March  - 1 x daily - 7 x weekly - 2 sets - 10 reps - Seated Long Arc Quad  - 1 x daily - 7 x weekly - 2 sets - 10 reps  GOALS: Goals reviewed with patient? Yes  SHORT TERM GOALS: Target date: 04/15/24  Patient will be independent with home exercise program to improve strength/mobility for increased functional independence with ADLs and mobility. Baseline: to be assessed Goal status: INITIAL   LONG TERM GOALS: Target date: 05/27/24  Patient (> 71 years old) will complete five times sit to stand test (5XSTS) in < 15 seconds indicating an increased LE strength and improved balance. Baseline: 16.57 seconds BUE support at walker upon standing, decreased eccentric control Goal status: INITIAL  2.  Patient will increase Berg Balance score by > 6 points to demonstrate decreased fall risk during functional activities. Baseline: 25/56 Goal status: INITIAL  3.  Patient will increase 3 minute walk test distance to >376ft for progression to community  level ambulation, demonstrating improved gait endurance. Baseline: Completed 2:17 min; 171ft Goal status: INITIAL  4.  Patient will increase 10 meter walk test to >0.6 m/s as to improve gait speed for better community ambulation and to reduce fall risk. Baseline: 0.24 m/s with RW, CGA-minA for posterior/R lateral LOBs Goal status: INITIAL  5.  Patient will reduce timed up and go to <15 seconds to reduce fall risk and demonstrate improved transfer/gait ability. Baseline: 20.77sec Goal status: INITIAL  6.  Patient will improve ABC scale score  by >20% to demonstrate increased confidence with functional mobility and ADLs Baseline: 46.25% Goal status: INITIAL  ASSESSMENT:  CLINICAL IMPRESSION: Pt arrived with good motivation for completion of PT activities. Pt began on nustep at decreased resistance, attempted three minutes but pt only able to tolerate 2 minutes before requiring a rest. Pt unable to complete 6 minutes and abruptly stopped at 5 min stating fatigue. All seated exercises require heavy verbal, tactile and visual cueing. Overall, this session focused on increased gait tolerance. Pt did not state any knee pain at this session, but would frequently stop activity relative to fatigue. Despite fatigue pt  did well navigating obstacles in the clinic for short bouts of gait. Throughout session pt was encouraged to complete HEP and was provided a new print out of previous exercises. End of session, pt was provided new end caps for RW for safer and quiter ambulation. Patient will benefit from skilled physical therapy intervention to reduce deficits and impairments identified in evaluation, in order to reduce pain, improve quality of life, and maximize activity tolerance for ADL, IADL, and leisure/fitness. Physical therapy will help pt achieve long and short term goals of care.      OBJECTIVE IMPAIRMENTS: Abnormal gait, decreased activity tolerance, decreased balance, decreased coordination,  decreased endurance, decreased knowledge of condition, decreased mobility, difficulty walking, decreased strength, and decreased safety awareness.   ACTIVITY LIMITATIONS: carrying, lifting, stairs, transfers, and locomotion level  PARTICIPATION LIMITATIONS: meal prep, cleaning, laundry, shopping, and community activity  PERSONAL FACTORS: Age, Sex, and 3+ comorbidities: CA, HTN, COPD, DM, depression are also affecting patient's functional outcome.   REHAB POTENTIAL: Fair    CLINICAL DECISION MAKING: Evolving/moderate complexity  EVALUATION COMPLEXITY: Moderate  PLAN:  PT FREQUENCY: 1-2x/week  PT DURATION: 12 weeks  PLANNED INTERVENTIONS: [id number]- PT Re-evaluation, 97750- Physical Performance Testing, 97110-Therapeutic exercises, 97530- Therapeutic activity, 97112- Neuromuscular re-education, 97535- Self Care, [id number]- Manual therapy, [phone]- Gait training, [phone]- Canalith repositioning, Patient/Family education, Balance training, Stair training, Joint mobilization, Vestibular training, Cryotherapy, and Moist heat  PLAN FOR NEXT SESSION:  -Assess stair navigation -Continue to provide education on deficits, promote safety awareness & use of AD -concerned about pt ability to safely come to PT here, but will continue to manage over next few sessions -Monitor knee pain (most limiting per pt with standing exercises) -Standing balance as tolerated  Note: Portions of this document were prepared using Dragon voice recognition software and although reviewed may contain unintentional dictation errors in syntax, grammar, or spelling.   Lonni KATHEE Gainer, PT 04/28/2024 7:56 AM   "

## 2024-04-28 NOTE — Telephone Encounter (Signed)
 Form faxed

## 2024-04-28 NOTE — Telephone Encounter (Signed)
 [facility] PT form placed in provider to be signed folder

## 2024-05-01 ENCOUNTER — Encounter: Payer: Self-pay | Admitting: Oncology

## 2024-05-01 ENCOUNTER — Ambulatory Visit (INDEPENDENT_AMBULATORY_CARE_PROVIDER_SITE_OTHER): Admitting: Vascular Surgery

## 2024-05-05 ENCOUNTER — Ambulatory Visit: Attending: Student | Admitting: Physical Therapy

## 2024-05-05 ENCOUNTER — Encounter: Payer: Self-pay | Admitting: Oncology

## 2024-05-05 DIAGNOSIS — R262 Difficulty in walking, not elsewhere classified: Secondary | ICD-10-CM | POA: Diagnosis present

## 2024-05-05 DIAGNOSIS — R2681 Unsteadiness on feet: Secondary | ICD-10-CM | POA: Insufficient documentation

## 2024-05-05 DIAGNOSIS — Z9181 History of falling: Secondary | ICD-10-CM | POA: Insufficient documentation

## 2024-05-05 DIAGNOSIS — M6281 Muscle weakness (generalized): Secondary | ICD-10-CM | POA: Insufficient documentation

## 2024-05-05 NOTE — Therapy (Signed)
 " OUTPATIENT PHYSICAL THERAPY TREATMENT  Patient Name: Terri [name] MRN: [id number] DOB:May 22, 1950, 74 y.o., female Today's Date: 05/05/2024  PCP: Gretel App, NP  REFERRING PROVIDER: Thomasena Sherran BROCKS, PA-C  END OF SESSION:   PT End of Session - 05/05/24 0903     Visit Number 7    Number of Visits 24    Date for Recertification  05/27/24    Authorization Type Medicare Part A & B    Progress Note Due on Visit 10    PT Start Time 0930    PT Stop Time 1011    PT Time Calculation (min) 41 min    Equipment Utilized During Treatment Gait belt    Activity Tolerance Patient tolerated treatment well    Behavior During Therapy Restless;Impulsive            Past Medical History:  Diagnosis Date   Adenomatous colon polyp    Aortic stenosis    Arthritis    Asthma    Cancer (HCC)    Claudication    COPD (chronic obstructive pulmonary disease) (HCC)    Depression    Diabetes mellitus without complication (HCC)    Esophageal dysphagia    GERD (gastroesophageal reflux disease)    Hyperlipemia    Hypertension    Obesity    Severe obesity (BMI 35.0-35.9 with comorbidity) (HCC)    Past Surgical History:  Procedure Laterality Date   BLADDER SURGERY     COLONOSCOPY WITH PROPOFOL  N/A 08/05/2015   Procedure: COLONOSCOPY WITH PROPOFOL ;  Surgeon: Donnice Vaughn Manes, MD;  Location: [facility] ENDOSCOPY;  Service: Endoscopy;  Laterality: N/A;   ESOPHAGOGASTRODUODENOSCOPY N/A 01/09/2022   Procedure: ESOPHAGOGASTRODUODENOSCOPY (EGD);  Surgeon: Maryruth Ole DASEN, MD;  Location: [facility] ENDOSCOPY;  Service: Endoscopy;  Laterality: N/A;   FOOT SURGERY Left    FRACTURE SURGERY     IR CV LINE INJECTION  06/22/2022   IR IMAGING GUIDED PORT INSERTION  06/05/2022   IR PORT REPAIR CENTRAL VENOUS ACCESS DEVICE  07/02/2022   LOWER EXTREMITY ANGIOGRAPHY Left 02/04/2023   Procedure: Lower Extremity Angiography;  Surgeon: Marea Selinda RAMAN, MD;  Location: [facility] INVASIVE CV LAB;  Service: Cardiovascular;  Laterality:  Left;   ORIF ANKLE FRACTURE Left 03/26/2017   Procedure: OPEN REDUCTION INTERNAL FIXATION (ORIF) ANKLE FRACTURE;  Surgeon: Edie Norleen PARAS, MD;  Location: [facility] ORS;  Service: Orthopedics;  Laterality: Left;   Patient Active Problem List   Diagnosis Date Noted   Stroke (HCC) 04/13/2024   Chronic kidney disease, stage 3a (HCC) 04/13/2024   Depression 04/13/2024   Overweight (BMI 25.0-29.9) 04/13/2024   Fall at home, initial encounter 04/13/2024   Type II diabetes mellitus with renal manifestations (HCC) 04/13/2024   Meningioma (HCC) 04/13/2024   Neuropathy 02/04/2024   Decreased hearing of both ears 02/04/2024   Rash 07/01/2023   Muscle strain of chest wall 07/01/2023   Peripheral vascular disease 05/14/2023   Personal history of fall 04/28/2023   COPD exacerbation (HCC) 03/29/2023   Dyspnea on exertion 02/02/2023   Vitamin D  deficiency 02/02/2023   Vitamin B 12 deficiency 02/02/2023   Diabetic eye exam (HCC) 02/02/2023   Aortic atherosclerosis 02/02/2023   Bradycardia 01/14/2023   Anemia 09/28/2022   Postmenopausal bleeding 01/30/2022   Endometrial cancer (HCC) 01/25/2022   Mild aortic stenosis 10/12/2020   Acute respiratory failure with hypoxia (HCC) 08/05/2015   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   Class 2 severe obesity due to excess calories with serious comorbidity in adult 08/19/2013   Obesity, Class  II, BMI 35-39.9, with comorbidity 08/19/2013   Chronic obstructive pulmonary disease, unspecified (HCC) 07/31/2012   Asthma with chronic obstructive pulmonary disease (COPD) (HCC) 07/31/2012   Atherosclerosis of native arteries of extremity with intermittent claudication 03/12/2012   Tobacco use 03/12/2012   Hyperlipidemia associated with type 2 diabetes mellitus (HCC) 06/06/2011   Hypertension associated with diabetes (HCC) 06/06/2011   Mood disorder 06/06/2011   Type 2 diabetes with complication (HCC) 06/06/2011    ONSET DATE: it started when I was a kid - 2003 for  imbalance  REFERRING DIAG:  R26.89 (ICD-10-CM) - Imbalance  G62.9 (ICD-10-CM) - Neuropathy   THERAPY DIAG:  No diagnosis found.  Rationale for Evaluation and Treatment: Rehabilitation  SUBJECTIVE:                                                                                                                                                                                             SUBJECTIVE STATEMENT:   Pt reports doing okay this date with minimal reports of knee pain but had some knee pain when moving around yesterday.    PERTINENT HISTORY:   Of note, pt also undergoing treatment for progressive endometrial cancer with cervical and vaginal involvement.   Per neurology note 01/20/24: Patient with PMH significant for hypertension, hyperlipidemia, diabetes, endometrial cancer (currently undergoing treatment). Patient presents for follow-up with ongoing imbalance and weakness. She did not start PT following last office visit given transportation issues. She is no longer taking gabapentin, however she is taking Lyrica 150 mg twice daily per external provider. Patient has previously been diagnosed with chronic severe sensorimotor polyneuropathy as well as advanced lumbar spine degeneration. Again offered referral to physiatry, patient defers at this time Again discussed referral to outpatient physical therapy for gait and balance training, however patient wishes to hold off at this time. Continue Lyrica 150 mg twice daily per external provider.    PAIN:  Are you having pain? No pain at rest, but stops walking/standing several times in session due to Left knee pain (non during STS exercises)   PRECAUTIONS: Fall and Other: pt reporting having port for chemo  WEIGHT BEARING RESTRICTIONS: No  FALLS: Has patient fallen in last 6 months? Pt unsure of when last fall was, estimates to be about last year. Pt notes that she did slip the other day where she knocked her bookcase over; pt reports  no injuries.   LIVING ENVIRONMENT: Lives with: lives alone Lives in: House/apartment - town house Stairs: Yes: Internal: ~12 steps; on right going up and External: 3 steps; on right going up Has following equipment at home: Quad cane small  base, Walker - 2 wheeled, shower chair, and Grab bars  PLOF: Independent with basic ADLs  PATIENT GOALS: walk normally   OBJECTIVE:  Note: Objective measures were completed at Evaluation unless otherwise noted.  DIAGNOSTIC FINDINGS:  Per neurology note 01/20/24: 10/08/2023 EMG IMPRESSION: There is electrodiagnostic evidence of severe sensorimotor polyneuropathy in the lower extremities.  09/06/2023 CT LUMBAR SPINE WO IMPRESSION:  1. No acute osseous abnormality in the Lumbar Spine. Chronic L1  compression fracture, lower thoracic hyperostosis related interbody  ankylosis including at T12-L1.  2. Advanced lumbar spine degeneration L3-L4 through L5-S1. Moderate  multifactorial spinal stenosis suspected at L3-L4, Mild at L4-L5.  Moderate bilateral L3, moderate to severe bilateral L4, and very  severe bilateral L5 nerve level foraminal stenoses.  3. Aortic Atherosclerosis (ICD10-I70.0).   06/05/2023 CT HEAD AND C SPINE WO CONTRAST IMPRESSION:  1. No acute intracranial pathology. Small-vessel white matter  disease.  2. Soft tissue contusion and hematoma of the right forehead.  3. No fracture or static subluxation of the cervical spine.  4. Moderate multilevel cervical disc degenerative disease.   12/19/2022 CT HEAD AND C SPINE WO CONTRAST IMPRESSION:  1. No acute intracranial abnormality.  2. Right orbital floor fracture with herniation of the inferior  rectus muscle body through the fracture site. Findings can be seen  in the setting of entrapment. Recommend ophthalmologic consultation.  3. Air in the intraconal compartment of the orbit abutting the right  optic nerve. No evidence of an intraorbital hematoma.  4. No acute fracture or  traumatic subluxation of the cervical spine.  5. Subcutaneous emphysema that extends inferiorly to the level of  the thyroid  and superiorly to the level of the periorbital soft  tissues. This may be secondary to an additional occult maxillary  sinus injury, possibly along the anterior wall.    COGNITION: Overall cognitive status: pt reporting difficulty with memory following recent chemo; unable to provide clear timeline of recent falls/stumbles   GAIT: Findings: Gait Characteristics: decreased stride length and narrow BOS, Distance walked: distance required for assessment, Assistive device utilized:Walker - 2 wheeled, and Level of assistance: CGA and Min A  FUNCTIONAL TESTS:  5 times sit to stand: 16.57 with RW for steadying, decreased eccentric control Timed up and go (TUG): to be assessed 3 minute walk test: to be assessed 10 meter walk test:  0.24 with RW, CGA-minA for posterior/R lateral LOBs   PATIENT SURVEYS:  ABC scale 11/5: 46%                                                                                                                             TREATMENT DATE 05/05/2024:   TA- To improve functional movements patterns for everyday tasks   Transfer with CGA to nustep from transport chair  Nustel level2-4 double hill interval training x 8 min - cues to continue pace   Gait to chair - CGA, with RW ~37ft   TE- To improve strength,  endurance, mobility, and function of specific targeted muscle groups or improve joint range of motion or improve muscle flexibility   Seated HS curl x 10 ea RTB   Seated LAQ 3 x 10 with 3#AW  Seated march with 2# AW 2 x 10- pt requires verbal, tactile, and visual cues to maintain knee flexion height.   Seated heel raises 2# AW - 3 x 12 - cues and visual demo for B movement and full ROM.   Seated toes raise  3 x 12 reps, similar cues   NMR: To facilitate reeducation of movement, balance, posture, coordination, and/or  proprioception/kinesthetic sense.   Standing narrow stance balance on foam surface 2 x 30 sec attempts-   Unless otherwise stated, CGA was provided and gait belt donned in order to ensure pt safety   Pt required occasional rest breaks due fatigue, PT was attentive to when pt appeared to be tired or winded in order to prevent excessive fatigue.   PATIENT EDUCATION: Education details: Heavy cues throughout, instruction that we will avoid knee pain and exacerbations as much as we are able.  Person educated: Patient Education method: Explanation and Verbal cues Education comprehension: verbalized understanding and needs further education  HOME EXERCISE PROGRAM: Access Code: [email or web address] URL: [email or web address] Date: 03/06/2024 Prepared by: Lonni Gainer  Exercises - Sit to stand with counter or walker support in front of you  - 1 x daily - 7 x weekly - 2 sets - 6 reps - Seated March  - 1 x daily - 7 x weekly - 2 sets - 10 reps - Seated Long Arc Quad  - 1 x daily - 7 x weekly - 2 sets - 10 reps  GOALS: Goals reviewed with patient? Yes  SHORT TERM GOALS: Target date: 04/15/24  Patient will be independent with home exercise program to improve strength/mobility for increased functional independence with ADLs and mobility. Baseline: to be assessed Goal status: INITIAL   LONG TERM GOALS: Target date: 05/27/24  Patient (> 64 years old) will complete five times sit to stand test (5XSTS) in < 15 seconds indicating an increased LE strength and improved balance. Baseline: 16.57 seconds BUE support at walker upon standing, decreased eccentric control Goal status: INITIAL  2.  Patient will increase Berg Balance score by > 6 points to demonstrate decreased fall risk during functional activities. Baseline: 25/56 Goal status: INITIAL  3.  Patient will increase 3 minute walk test distance to >353ft for progression to community level ambulation, demonstrating improved gait  endurance. Baseline: Completed 2:17 min; 138ft Goal status: INITIAL  4.  Patient will increase 10 meter walk test to >0.6 m/s as to improve gait speed for better community ambulation and to reduce fall risk. Baseline: 0.24 m/s with RW, CGA-minA for posterior/R lateral LOBs Goal status: INITIAL  5.  Patient will reduce timed up and go to <15 seconds to reduce fall risk and demonstrate improved transfer/gait ability. Baseline: 20.77sec Goal status: INITIAL  6.  Patient will improve ABC scale score  by >20% to demonstrate increased confidence with functional mobility and ADLs Baseline: 46.25% Goal status: INITIAL  ASSESSMENT:  CLINICAL IMPRESSION:  Patient arrived with good motivation for completion of pt activities.  Pt continues with progressive strengthening and balance activities. Pt still complains of some left knee pain with standing activities but reports it was more tolerable this date.  Will continue to progress with more standing and balance related activities as patient is able.  Patient does continue to require attention  and cues to task at hand otherwise she begins doing irrelevant exercises with poor form.  Pt will continue to benefit from skilled physical therapy intervention to address impairments, improve QOL, and attain therapy goals.       OBJECTIVE IMPAIRMENTS: Abnormal gait, decreased activity tolerance, decreased balance, decreased coordination, decreased endurance, decreased knowledge of condition, decreased mobility, difficulty walking, decreased strength, and decreased safety awareness.   ACTIVITY LIMITATIONS: carrying, lifting, stairs, transfers, and locomotion level  PARTICIPATION LIMITATIONS: meal prep, cleaning, laundry, shopping, and community activity  PERSONAL FACTORS: Age, Sex, and 3+ comorbidities: CA, HTN, COPD, DM, depression are also affecting patient's functional outcome.   REHAB POTENTIAL: Fair    CLINICAL DECISION MAKING: Evolving/moderate  complexity  EVALUATION COMPLEXITY: Moderate  PLAN:  PT FREQUENCY: 1-2x/week  PT DURATION: 12 weeks  PLANNED INTERVENTIONS: 97164- PT Re-evaluation, 97750- Physical Performance Testing, 97110-Therapeutic exercises, 97530- Therapeutic activity, 97112- Neuromuscular re-education, 97535- Self Care, [id number]- Manual therapy, [phone]- Gait training, [phone]- Canalith repositioning, Patient/Family education, Balance training, Stair training, Joint mobilization, Vestibular training, Cryotherapy, and Moist heat  PLAN FOR NEXT SESSION:  -Assess stair navigation -Continue to provide education on deficits, promote safety awareness & use of AD -concerned about pt ability to safely come to PT here, but will continue to manage over next few sessions -Monitor knee pain (most limiting per pt with standing exercises) -Standing balance as tolerated  Note: Portions of this document were prepared using Dragon voice recognition software and although reviewed may contain unintentional dictation errors in syntax, grammar, or spelling.  Leonor Rode, SPT    I have reviewed and concur with this student's documentation.   Lonni KATHEE Gainer, PT 05/05/2024 9:11 AM   "

## 2024-05-07 ENCOUNTER — Ambulatory Visit: Admitting: Physical Therapy

## 2024-05-07 ENCOUNTER — Encounter: Payer: Self-pay | Admitting: Nurse Practitioner

## 2024-05-07 ENCOUNTER — Ambulatory Visit: Admitting: Nurse Practitioner

## 2024-05-07 VITALS — BP 130/62 | HR 70 | Temp 97.7°F | Ht 62.0 in | Wt 143.6 lb

## 2024-05-07 DIAGNOSIS — E118 Type 2 diabetes mellitus with unspecified complications: Secondary | ICD-10-CM | POA: Diagnosis not present

## 2024-05-07 DIAGNOSIS — C541 Malignant neoplasm of endometrium: Secondary | ICD-10-CM

## 2024-05-07 DIAGNOSIS — I152 Hypertension secondary to endocrine disorders: Secondary | ICD-10-CM

## 2024-05-07 DIAGNOSIS — Z8673 Personal history of transient ischemic attack (TIA), and cerebral infarction without residual deficits: Secondary | ICD-10-CM | POA: Diagnosis not present

## 2024-05-07 DIAGNOSIS — E1159 Type 2 diabetes mellitus with other circulatory complications: Secondary | ICD-10-CM | POA: Diagnosis not present

## 2024-05-07 DIAGNOSIS — Z794 Long term (current) use of insulin: Secondary | ICD-10-CM | POA: Diagnosis not present

## 2024-05-07 NOTE — Assessment & Plan Note (Signed)
 No longer undergoing chemotherapy. Currently on Megace . Treatment effectiveness and potential hospice care transition have been evaluated. Recent PET scan showed disease progression. Continue current management and follow up with Oncology next week as scheduled.

## 2024-05-07 NOTE — Assessment & Plan Note (Addendum)
 A recent CVA was identified during outpatient MRI with Oncology. Symptoms present for several weeks prior, patient did not seek medical attention. Continue ASA, Crestor and Plavix . Continue physical therapy and follow up with neurology as scheduled.

## 2024-05-07 NOTE — Assessment & Plan Note (Signed)
 Blood pressure is adequately controlled with the current medication regimen. Continue metoprolol , losartan , hydrochlorothiazide , amlodipine , and clonidine .

## 2024-05-07 NOTE — Progress Notes (Signed)
 " Leron Glance, NP-C Phone: [phone]  Terri [name] is a 74 y.o. female who presents today for follow up.   Discussed the use of AI scribe software for clinical note transcription with the patient, who gave verbal consent to proceed.  History of Present Illness   Terri [name] is a 74 year old female with a history of stroke and cancer who presents for follow-up care.  She experienced a stroke right before Christmas, which was identified after diagnostic tests. She did not notice any symptoms at the time and was unaware of the stroke until the tests were conducted. She is currently undergoing physical therapy, which she describes as 'so-so'. No chest pain or shortness of breath.  She has endometrial cancer and attends monthly follow-ups. She is no longer undergoing chemotherapy but is taking Megace .  She manages her diabetes with Tresiba  and Novolog . She administers Novolog  three times a day with meals and Tresiba  once a day. Her blood sugar levels were previously well-controlled but are currently not as stable. She has been in communication with a pharmacist for assistance.  She takes multiple blood pressure medications, including metoprolol , losartan , hydrochlorothiazide , amlodipine , and clonidine . She does not monitor her blood pressure at home but does check her blood sugar.  She lives alone and does not have help at home. Her house is difficult for her to clean, and she does have a child psychotherapist through the cancer center to assist her with needs. She does not drive and relies on a friend for transportation.   Tobacco Use History[1]  Medications Ordered Prior to Encounter[2]   ROS see history of present illness  Objective  Physical Exam Vitals:   05/07/24 1102  BP: 130/62  Pulse: 70  Temp: 97.7 F (36.5 C)  SpO2: 97%    BP Readings from Last 3 Encounters:  05/07/24 130/62  04/14/24 (!) 136/54  04/06/24 98/66   Wt Readings from Last 3 Encounters:  05/07/24 143 lb 9.6 oz  (65.1 kg)  04/13/24 146 lb 0.9 oz (66.3 kg)  04/06/24 149 lb (67.6 kg)    Physical Exam Constitutional:      General: She is not in acute distress.    Appearance: Normal appearance.  HENT:     Head: Normocephalic.  Cardiovascular:     Rate and Rhythm: Normal rate and regular rhythm.     Heart sounds: Normal heart sounds.  Pulmonary:     Effort: Pulmonary effort is normal.     Breath sounds: Normal breath sounds.  Skin:    General: Skin is warm and dry.  Neurological:     General: No focal deficit present.     Mental Status: She is alert.  Psychiatric:        Mood and Affect: Mood normal.        Behavior: Behavior normal.      Assessment/Plan: Please see individual problem list.  Type 2 diabetes with complication (HCC) Assessment & Plan: Blood glucose levels have increased from previous good control. Most recent A1c increased from 6.2 to 8.0. She is currently on Tresiba  once daily and Novolog  three times daily with meals. Continue current diabetes medication regimen. Encourage healthy diet. We will continue to monitor.    History of CVA (cerebrovascular accident) Assessment & Plan: A recent CVA was identified during outpatient MRI with Oncology. Symptoms present for several weeks prior, patient did not seek medical attention. Continue ASA, Crestor and Plavix . Continue physical therapy and follow up with neurology as scheduled.  Endometrial cancer [facility]) Assessment & Plan: No longer undergoing chemotherapy. Currently on Megace . Treatment effectiveness and potential hospice care transition have been evaluated. Recent PET scan showed disease progression. Continue current management and follow up with Oncology next week as scheduled.    Hypertension associated with diabetes (HCC) Assessment & Plan: Blood pressure is adequately controlled with the current medication regimen. Continue metoprolol , losartan , hydrochlorothiazide , amlodipine , and clonidine .      Return in  about 3 months (around 08/05/2024) for Follow up.   Leron Glance, NP-C [facility] [place]     [1]  Social History Tobacco Use  Smoking Status Former   Current packs/day: 1.00   Average packs/day: 1 pack/day for 40.0 years (40.0 ttl pk-yrs)   Types: Cigarettes  Smokeless Tobacco Never  Tobacco Comments   Quite 2015  [2]  Current Outpatient Medications on File Prior to Visit  Medication Sig Dispense Refill   albuterol  (VENTOLIN  HFA) 108 (90 Base) MCG/ACT inhaler Inhale 2 puffs into the lungs every 6 (six) hours as needed for wheezing or shortness of breath. 1 each 1   amLODipine  (NORVASC ) 10 MG tablet Take 1 tablet (10 mg total) by mouth daily. 90 tablet 3   aspirin  EC 81 MG tablet Take 81 mg by mouth daily. Swallow whole.     atorvastatin  (LIPITOR) 80 MG tablet Take 1 tablet (80 mg total) by mouth daily. 90 tablet 3   Blood Glucose Monitoring Suppl DEVI Use to check blood glucose as instructed. May substitute to any manufacturer covered by patient's 2026 insurance (Accuchek or Contour meters =$0). E11.65. Z79.4 1 each 0   budesonide -glycopyrrolate -formoterol  (BREZTRI ) 160-9-4.8 MCG/ACT AERO inhaler Inhale 2 puffs into the lungs 2 (two) times daily. Via PAP [place]&Me     buPROPion  (WELLBUTRIN  XL) 300 MG 24 hr tablet Take 1 tablet (300 mg total) by mouth daily. 90 tablet 0   Cholecalciferol  (VITAMIN D3) 50 MCG (2000 UT) capsule Take 2,000 Units by mouth daily.     citalopram  (CELEXA ) 20 MG tablet Take 1 tablet (20 mg total) by mouth daily. 90 tablet 3   cloNIDine  (CATAPRES ) 0.2 MG tablet Take 1 tablet (0.2 mg total) by mouth 2 (two) times daily. 180 tablet 3   cyanocobalamin  (VITAMIN B12) 1000 MCG tablet Take 1 tablet (1,000 mcg total) by mouth daily. 90 tablet 3   glucose blood test strip Use as instructed to test blood sugar 4 times daily. 100 each 12   insulin  aspart (NOVOLOG ) 100 UNIT/ML injection Inject 8 Units into the skin 3 (three) times daily before meals.      insulin  degludec (TRESIBA  FLEXTOUCH) 100 UNIT/ML FlexTouch Pen Inject 20 Units into the skin daily. 20 units once daily - REPLACES Levemir      Lancets MISC Use to check blood sugar up to four times daily as directed. E11.65, Z79.4. (2026 insurance prefers Contour or Accuchek =$0) 100 each 11   losartan -hydrochlorothiazide  (HYZAAR) 100-25 MG tablet Take 1 tablet by mouth daily. 90 tablet 3   megestrol  (MEGACE ) 40 MG tablet Take 2 tablets (80 mg total) by mouth 2 (two) times daily. 120 tablet 1   metoprolol  succinate (TOPROL -XL) 50 MG 24 hr tablet Take 1 tablet (50 mg total) by mouth daily. 90 tablet 0   montelukast  (SINGULAIR ) 10 MG tablet Take 10 mg by mouth at bedtime.     ondansetron  (ZOFRAN ) 8 MG tablet Take 1 tablet (8 mg total) by mouth every 8 (eight) hours as needed for nausea or vomiting. 30  tablet 0   No current facility-administered medications on file prior to visit.   "

## 2024-05-07 NOTE — Assessment & Plan Note (Signed)
 Blood glucose levels have increased from previous good control. Most recent A1c increased from 6.2 to 8.0. She is currently on Tresiba  once daily and Novolog  three times daily with meals. Continue current diabetes medication regimen. Encourage healthy diet. We will continue to monitor.

## 2024-05-08 ENCOUNTER — Ambulatory Visit: Admitting: Physical Therapy

## 2024-05-08 ENCOUNTER — Telehealth: Payer: Self-pay | Admitting: Physical Therapy

## 2024-05-08 NOTE — Telephone Encounter (Signed)
 Contacted pt regarding missed PT appointment this date. Pt reports she forgot and is apologetic for doing so. She was informed of next appointment on Tuesday 05/12/24 and she states she will be present for this appointment.   Lonni Gainer PT, DPT

## 2024-05-11 ENCOUNTER — Inpatient Hospital Stay

## 2024-05-11 ENCOUNTER — Encounter: Payer: Self-pay | Admitting: Oncology

## 2024-05-11 ENCOUNTER — Inpatient Hospital Stay: Attending: Obstetrics and Gynecology

## 2024-05-11 ENCOUNTER — Inpatient Hospital Stay: Attending: Obstetrics and Gynecology | Admitting: Oncology

## 2024-05-11 VITALS — BP 141/57 | HR 79 | Temp 96.5°F | Resp 18 | Ht 62.0 in | Wt 146.0 lb

## 2024-05-11 DIAGNOSIS — C541 Malignant neoplasm of endometrium: Secondary | ICD-10-CM | POA: Diagnosis not present

## 2024-05-11 LAB — CBC WITH DIFFERENTIAL/PLATELET
Abs Immature Granulocytes: 0.11 K/uL — ABNORMAL HIGH (ref 0.00–0.07)
Basophils Absolute: 0 K/uL (ref 0.0–0.1)
Basophils Relative: 0 %
Eosinophils Absolute: 0.1 K/uL (ref 0.0–0.5)
Eosinophils Relative: 1 %
HCT: 34.3 % — ABNORMAL LOW (ref 36.0–46.0)
Hemoglobin: 11.7 g/dL — ABNORMAL LOW (ref 12.0–15.0)
Immature Granulocytes: 2 %
Lymphocytes Relative: 9 %
Lymphs Abs: 0.6 K/uL — ABNORMAL LOW (ref 0.7–4.0)
MCH: 28.5 pg (ref 26.0–34.0)
MCHC: 34.1 g/dL (ref 30.0–36.0)
MCV: 83.7 fL (ref 80.0–100.0)
Monocytes Absolute: 0.4 K/uL (ref 0.1–1.0)
Monocytes Relative: 7 %
Neutro Abs: 5.2 K/uL (ref 1.7–7.7)
Neutrophils Relative %: 81 %
Platelets: 188 K/uL (ref 150–400)
RBC: 4.1 MIL/uL (ref 3.87–5.11)
RDW: 13.7 % (ref 11.5–15.5)
WBC: 6.4 K/uL (ref 4.0–10.5)
nRBC: 0 % (ref 0.0–0.2)

## 2024-05-11 LAB — CMP (CANCER CENTER ONLY)
ALT: 9 U/L (ref 0–44)
AST: 18 U/L (ref 15–41)
Albumin: 3.7 g/dL (ref 3.5–5.0)
Alkaline Phosphatase: 102 U/L (ref 38–126)
Anion gap: 9 (ref 5–15)
BUN: 22 mg/dL (ref 8–23)
CO2: 27 mmol/L (ref 22–32)
Calcium: 9.3 mg/dL (ref 8.9–10.3)
Chloride: 101 mmol/L (ref 98–111)
Creatinine: 0.98 mg/dL (ref 0.44–1.00)
GFR, Estimated: 60 mL/min — ABNORMAL LOW
Glucose, Bld: 256 mg/dL — ABNORMAL HIGH (ref 70–99)
Potassium: 4 mmol/L (ref 3.5–5.1)
Sodium: 137 mmol/L (ref 135–145)
Total Bilirubin: 0.3 mg/dL (ref 0.0–1.2)
Total Protein: 5.8 g/dL — ABNORMAL LOW (ref 6.5–8.1)

## 2024-05-11 NOTE — Progress Notes (Unsigned)
 " [facility]  [phone] Fax:([phone]) [phone]  ID: Terri [name] OB: 1950/11/20  MR#: [id number]  RDW#:[id number]  Patient Care Team: Gretel App, NP as PCP - General (Nurse Practitioner) Maurie Rayfield BIRCH, RN as Oncology Nurse Navigator Jacobo, Evalene PARAS, MD as Consulting Physician (Oncology) Kurtis Verdel Adler, MD (Inactive) as Referring Physician (Ophthalmology) [facility] Isadora Hose, MD as Consulting Physician (Pulmonary Disease) Geronimo Manuelita SAUNDERS, RPH-CPP as Pharmacist (Pharmacist) Clarisa Therisa RIGGERS as Physician Assistant  CHIEF COMPLAINT: 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  INTERVAL HISTORY: Patient returns to clinic today for repeat laboratory work and further evaluation.  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  She did not increase her dose of Megace  to twice a day despite recommendations.  She currently feels well and at her baseline.  She continues to have chronic weakness and fatigue.  She has no neurologic complaints.  She does not complain of vaginal bleeding today.  She denies any recent fevers or illnesses.  She has no chest pain, shortness of breath, cough, or hemoptysis.  She denies any nausea, vomiting, constipation, or diarrhea.  She has no urinary complaints.  Patient offers no further specific complaints today.  REVIEW OF SYSTEMS:   Review of Systems  Constitutional:  Positive for malaise/fatigue. Negative for fever and weight loss.  Respiratory: Negative.  Negative for cough, hemoptysis and shortness of breath.   Cardiovascular: Negative.  Negative for chest pain and leg swelling.  Gastrointestinal: Negative.  Negative for abdominal pain, blood in stool and melena.  Genitourinary: Negative.  Negative for dysuria.  Musculoskeletal: Negative.  Negative for back pain and falls.  Skin: Negative.  Negative for rash.  Neurological:  Positive for weakness. Negative for  dizziness, sensory change, speech change, focal weakness and headaches.  Psychiatric/Behavioral: Negative.  The patient is not nervous/anxious.     As per HPI. Otherwise, a complete review of systems is negative.  PAST MEDICAL HISTORY: Past Medical History:  Diagnosis Date   Adenomatous colon polyp    Aortic stenosis    Arthritis    Asthma    Cancer (HCC)    Claudication    COPD (chronic obstructive pulmonary disease) (HCC)    Depression    Diabetes mellitus without complication (HCC)    Esophageal dysphagia    GERD (gastroesophageal reflux disease)    Hyperlipemia    Hypertension    Obesity    Severe obesity (BMI 35.0-35.9 with comorbidity) (HCC)     PAST SURGICAL HISTORY: Past Surgical History:  Procedure Laterality Date   BLADDER SURGERY     COLONOSCOPY WITH PROPOFOL  N/A 08/05/2015   Procedure: COLONOSCOPY WITH PROPOFOL ;  Surgeon: Donnice Vaughn Manes, MD;  Location: [facility] ENDOSCOPY;  Service: Endoscopy;  Laterality: N/A;   ESOPHAGOGASTRODUODENOSCOPY N/A 01/09/2022   Procedure: ESOPHAGOGASTRODUODENOSCOPY (EGD);  Surgeon: Maryruth Ole DASEN, MD;  Location: [facility] ENDOSCOPY;  Service: Endoscopy;  Laterality: N/A;   FOOT SURGERY Left    FRACTURE SURGERY     IR CV LINE INJECTION  06/22/2022   IR IMAGING GUIDED PORT INSERTION  06/05/2022   IR PORT REPAIR CENTRAL VENOUS ACCESS DEVICE  07/02/2022   LOWER EXTREMITY ANGIOGRAPHY Left 02/04/2023   Procedure: Lower Extremity Angiography;  Surgeon: Marea Selinda RAMAN, MD;  Location: [facility] INVASIVE CV LAB;  Service: Cardiovascular;  Laterality: Left;   ORIF ANKLE FRACTURE Left 03/26/2017   Procedure: OPEN REDUCTION INTERNAL FIXATION (ORIF) ANKLE FRACTURE;  Surgeon: Edie Norleen PARAS, MD;  Location: [facility] ORS;  Service: Orthopedics;  Laterality: Left;    FAMILY HISTORY: Family History  Problem Relation Age of Onset   CVA Mother    Diabetes Mother    CAD Father    Breast cancer Neg Hx     ADVANCED DIRECTIVES (Y/N):  N  HEALTH  MAINTENANCE: Social History   Tobacco Use   Smoking status: Former    Current packs/day: 1.00    Average packs/day: 1 pack/day for 40.0 years (40.0 ttl pk-yrs)    Types: Cigarettes   Smokeless tobacco: Never   Tobacco comments:    Quite 2015  Vaping Use   Vaping status: Never Used  Substance Use Topics   Alcohol use: No   Drug use: No     Colonoscopy:  PAP:  Bone density:  Lipid panel:  No Known Allergies  Current Outpatient Medications  Medication Sig Dispense Refill   albuterol  (VENTOLIN  HFA) 108 (90 Base) MCG/ACT inhaler Inhale 2 puffs into the lungs every 6 (six) hours as needed for wheezing or shortness of breath. 1 each 1   amLODipine  (NORVASC ) 10 MG tablet Take 1 tablet (10 mg total) by mouth daily. 90 tablet 3   aspirin  EC 81 MG tablet Take 81 mg by mouth daily. Swallow whole.     atorvastatin  (LIPITOR) 80 MG tablet Take 1 tablet (80 mg total) by mouth daily. 90 tablet 3   Blood Glucose Monitoring Suppl DEVI Use to check blood glucose as instructed. May substitute to any manufacturer covered by patient's 2026 insurance (Accuchek or Contour meters =$0). E11.65. Z79.4 1 each 0   budesonide -glycopyrrolate -formoterol  (BREZTRI ) 160-9-4.8 MCG/ACT AERO inhaler Inhale 2 puffs into the lungs 2 (two) times daily. Via PAP AZ&Me     buPROPion  (WELLBUTRIN  XL) 300 MG 24 hr tablet Take 1 tablet (300 mg total) by mouth daily. 90 tablet 0   Cholecalciferol  (VITAMIN D3) 50 MCG (2000 UT) capsule Take 2,000 Units by mouth daily.     citalopram  (CELEXA ) 20 MG tablet Take 1 tablet (20 mg total) by mouth daily. 90 tablet 3   cloNIDine  (CATAPRES ) 0.2 MG tablet Take 1 tablet (0.2 mg total) by mouth 2 (two) times daily. 180 tablet 3   cyanocobalamin  (VITAMIN B12) 1000 MCG tablet Take 1 tablet (1,000 mcg total) by mouth daily. 90 tablet 3   glucose blood test strip Use as instructed to test blood sugar 4 times daily. 100 each 12   insulin  aspart (NOVOLOG ) 100 UNIT/ML injection Inject 8 Units into  the skin 3 (three) times daily before meals.     insulin  degludec (TRESIBA  FLEXTOUCH) 100 UNIT/ML FlexTouch Pen Inject 20 Units into the skin daily. 20 units once daily - REPLACES Levemir      Lancets MISC Use to check blood sugar up to four times daily as directed. E11.65, Z79.4. (2026 insurance prefers Contour or Accuchek =$0) 100 each 11   losartan -hydrochlorothiazide  (HYZAAR) 100-25 MG tablet Take 1 tablet by mouth daily. 90 tablet 3   megestrol  (MEGACE ) 40 MG tablet Take 2 tablets (80 mg total) by mouth 2 (two) times daily. 120 tablet 1   metoprolol  succinate (TOPROL -XL) 50 MG 24 hr tablet Take 1 tablet (50 mg total) by mouth daily. 90 tablet 0   montelukast  (SINGULAIR ) 10 MG tablet Take 10 mg by mouth at bedtime.     ondansetron  (ZOFRAN ) 8 MG tablet Take 1 tablet (8 mg total) by mouth every 8 (eight) hours as needed for nausea or vomiting. 30 tablet 0   No current facility-administered medications  for this visit.    OBJECTIVE: Vitals:   05/11/24 1333  BP: (!) 141/57  Pulse: 79  Resp: 18  Temp: (!) 96.5 F (35.8 C)  SpO2: 100%       Body mass index is 26.7 kg/m.    ECOG FS:2 - Symptomatic, <50% confined to bed  General: Well-developed, well-nourished, no acute distress.  Sitting in a wheelchair. Eyes: Pink conjunctiva, anicteric sclera. HEENT: Normocephalic, moist mucous membranes. Lungs: No audible wheezing or coughing. Heart: Regular rate and rhythm. Abdomen: Soft, nontender, no obvious distention. Musculoskeletal: No edema, cyanosis, or clubbing. Neuro: Alert, answering all questions appropriately. Cranial nerves grossly intact. Skin: No rashes or petechiae noted. Psych: Normal affect.  LAB RESULTS:  Lab Results  Component Value Date   NA 137 05/11/2024   K 4.0 05/11/2024   CL 101 05/11/2024   CO2 27 05/11/2024   GLUCOSE 256 (H) 05/11/2024   BUN 22 05/11/2024   CREATININE 0.98 05/11/2024   CALCIUM  9.3 05/11/2024   PROT 5.8 (L) 05/11/2024   ALBUMIN 3.7  05/11/2024   AST 18 05/11/2024   ALT 9 05/11/2024   ALKPHOS 102 05/11/2024   BILITOT 0.3 05/11/2024   GFRNONAA 60 (L) 05/11/2024   GFRAA >60 03/27/2017    Lab Results  Component Value Date   WBC 6.4 05/11/2024   NEUTROABS 5.2 05/11/2024   HGB 11.7 (L) 05/11/2024   HCT 34.3 (L) 05/11/2024   MCV 83.7 05/11/2024   PLT 188 05/11/2024     STUDIES:    ONCOLOGY HISTORY: Patient was initially hesitant to undergo chemotherapy and elected to do XRT only which was completed on April 18, 2022.  CT scan results from October 08, 2022 reviewed independently with no obvious evidence of progressive disease. She completed 6 cycles of carboplatinum, Taxol , and Keytruda  on October 03, 2022.  Patient then initiated maintenance Keytruda  on October 23, 2022.  PET scan results from January 15, 2023 reviewed independently with mild nonspecific residual hypermetabolism in the lower uterine segment as well as small bilateral common iliac nodes possibly suggesting nodal metastasis.  Patient was seen by gynecology oncology who determined surgical intervention is not an option.  They recommend discontinuing Keytruda  for progressive disease and initiating single agent Doxil  every 28 days.  Patient received 3 cycles of Doxil  before progression of disease her last dose was given on June 05, 2023.  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  ER 70%, PR 30%.  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  Repeat PET scan on October 15, 2023 reviewed independently with significant improvement of disease burden.  Internal exam by gynecology oncology at that time also confirmed disease improvement.  Patient subsequently underwent 3 additional cycles of single agent Taxol  completing on January 01, 2024.  Repeat PET scan on January 31, 2024 reviewed independently with stable/possibly slight progression of disease.  No further  chemotherapy is planned at this time.  Patient was initiated on 80 mg Megace  daily and is tolerating it well.  She was once again instructed to increase her dose to 80 mg twice per day.  No further intervention is needed.  Return to clinic in 4 months with repeat laboratory work and further evaluation.  Consider repeat PET scan in February 2026.   Possible CVA: Resolved.  Imaging as above. Port: Port revision successful.  Continue with port flushes every 6 weeks. Hyperglycemia: Patient continues to have poor control with blood glucose  levels persistently greater than 200.  Patient is currently on insulin  and plans to discuss with primary care about adjusting her dose. Renal insufficiency: Resolved. Anemia: Hemoglobin has trended up to 11.7.   Thrombocytopenia: Resolved. Vaginal bleeding: Improved.  Continue follow-up with gynecology oncology as above.  Claudication symptoms: Resolved.  Patient underwent revascularization procedure on February 04, 2023.  Unclear if patient has been back to see vascular surgery. Right hamstring tendon rupture: Patient reports there is no plan for surgical repair. Poor appetite/weight loss: Improved. Peripheral neuropathy: Patient reports this has improved.  Continue follow-up with neurology as recommended.   Falls: Patient reports 1 fall earlier this week.  Patient now has 2 walkers at her home upstairs and downstairs.  She also keeps her morning meds downstairs and her nighttime meds upstairs to minimize going up and down her stairs during the day.  She admits to only using them occasionally.  Patient expressed understanding and was in agreement with this plan. She also understands that She can call clinic at any time with any questions, concerns, or complaints.    Cancer Staging  Endometrial cancer [facility]) Staging form: Corpus Uteri - Carcinoma and Carcinosarcoma, AJCC 8th Edition - Clinical stage from 05/30/2022: FIGO Stage IIIB (cT3b, cN0, cM0) - Signed by Jacobo Evalene PARAS, MD on 05/30/2022 Stage prefix: Initial diagnosis   Evalene PARAS Jacobo, MD   05/12/2024 7:51 AM     "

## 2024-05-11 NOTE — Progress Notes (Unsigned)
 Patient is only taking her Megestrol  once a day, and she would like to discuss the dosage?

## 2024-05-12 ENCOUNTER — Encounter: Payer: Self-pay | Admitting: Oncology

## 2024-05-12 ENCOUNTER — Ambulatory Visit: Admitting: Physical Therapy

## 2024-05-14 ENCOUNTER — Encounter: Payer: Self-pay | Admitting: Oncology

## 2024-05-14 ENCOUNTER — Ambulatory Visit: Admitting: Physical Therapy

## 2024-05-14 ENCOUNTER — Telehealth: Payer: Self-pay | Admitting: Pharmacist

## 2024-05-14 DIAGNOSIS — Z9181 History of falling: Secondary | ICD-10-CM

## 2024-05-14 DIAGNOSIS — R262 Difficulty in walking, not elsewhere classified: Secondary | ICD-10-CM

## 2024-05-14 DIAGNOSIS — M6281 Muscle weakness (generalized): Secondary | ICD-10-CM

## 2024-05-14 NOTE — Therapy (Signed)
 " OUTPATIENT PHYSICAL THERAPY TREATMENT  Patient Name: Terri [name] MRN: [id number] DOB:[birth year], 74 y.o., female Today's Date: 05/14/2024  PCP: Gretel App, NP  REFERRING PROVIDER: Thomasena Sherran BROCKS, PA-C  END OF SESSION:   PT End of Session - 05/14/24 0946     Visit Number 8    Number of Visits 24    Date for Recertification  05/27/24    Authorization Type Medicare Part A & B    Progress Note Due on Visit 10    PT Start Time 0932    PT Stop Time 1011    PT Time Calculation (min) 39 min    Equipment Utilized During Treatment Gait belt    Activity Tolerance Patient tolerated treatment well    Behavior During Therapy Restless;Impulsive            Past Medical History:  Diagnosis Date   Adenomatous colon polyp    Aortic stenosis    Arthritis    Asthma    Cancer (HCC)    Claudication    COPD (chronic obstructive pulmonary disease) (HCC)    Depression    Diabetes mellitus without complication (HCC)    Esophageal dysphagia    GERD (gastroesophageal reflux disease)    Hyperlipemia    Hypertension    Obesity    Severe obesity (BMI 35.0-35.9 with comorbidity) (HCC)    Past Surgical History:  Procedure Laterality Date   BLADDER SURGERY     COLONOSCOPY WITH PROPOFOL  N/A 08/05/2015   Procedure: COLONOSCOPY WITH PROPOFOL ;  Surgeon: Donnice Vaughn Manes, MD;  Location: [facility] ENDOSCOPY;  Service: Endoscopy;  Laterality: N/A;   ESOPHAGOGASTRODUODENOSCOPY N/A 01/09/2022   Procedure: ESOPHAGOGASTRODUODENOSCOPY (EGD);  Surgeon: Maryruth Ole DASEN, MD;  Location: [facility] ENDOSCOPY;  Service: Endoscopy;  Laterality: N/A;   FOOT SURGERY Left    FRACTURE SURGERY     IR CV LINE INJECTION  06/22/2022   IR IMAGING GUIDED PORT INSERTION  06/05/2022   IR PORT REPAIR CENTRAL VENOUS ACCESS DEVICE  07/02/2022   LOWER EXTREMITY ANGIOGRAPHY Left 02/04/2023   Procedure: Lower Extremity Angiography;  Surgeon: Marea Selinda RAMAN, MD;  Location: [facility] INVASIVE CV LAB;  Service: Cardiovascular;  Laterality:  Left;   ORIF ANKLE FRACTURE Left 03/26/2017   Procedure: OPEN REDUCTION INTERNAL FIXATION (ORIF) ANKLE FRACTURE;  Surgeon: Edie Norleen PARAS, MD;  Location: [facility] ORS;  Service: Orthopedics;  Laterality: Left;   Patient Active Problem List   Diagnosis Date Noted   History of CVA (cerebrovascular accident) 05/07/2024   Stroke (HCC) 04/13/2024   Chronic kidney disease, stage 3a (HCC) 04/13/2024   Depression 04/13/2024   Overweight (BMI 25.0-29.9) 04/13/2024   Fall at home, initial encounter 04/13/2024   Type II diabetes mellitus with renal manifestations (HCC) 04/13/2024   Meningioma (HCC) 04/13/2024   Neuropathy 02/04/2024   Decreased hearing of both ears 02/04/2024   Rash 07/01/2023   Muscle strain of chest wall 07/01/2023   Peripheral vascular disease 05/14/2023   Personal history of fall 04/28/2023   COPD exacerbation (HCC) 03/29/2023   Dyspnea on exertion 02/02/2023   Vitamin D  deficiency 02/02/2023   Vitamin B 12 deficiency 02/02/2023   Diabetic eye exam (HCC) 02/02/2023   Aortic atherosclerosis 02/02/2023   Bradycardia 01/14/2023   Anemia 09/28/2022   Postmenopausal bleeding 01/30/2022   Endometrial cancer (HCC) 01/25/2022   Mild aortic stenosis 10/12/2020   Acute respiratory failure with hypoxia (HCC) 08/05/2015   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   Class 2 severe obesity due to excess calories with serious  comorbidity in adult 08/19/2013   Obesity, Class II, BMI 35-39.9, with comorbidity 08/19/2013   Chronic obstructive pulmonary disease, unspecified (HCC) 07/31/2012   Asthma with chronic obstructive pulmonary disease (COPD) (HCC) 07/31/2012   Atherosclerosis of native arteries of extremity with intermittent claudication 03/12/2012   Tobacco use 03/12/2012   Hyperlipidemia associated with type 2 diabetes mellitus (HCC) 06/06/2011   Hypertension associated with diabetes (HCC) 06/06/2011   Mood disorder 06/06/2011   Type 2 diabetes with complication (HCC) 06/06/2011    ONSET  DATE: it started when I was a kid - 2003 for imbalance  REFERRING DIAG:  R26.89 (ICD-10-CM) - Imbalance  G62.9 (ICD-10-CM) - Neuropathy   THERAPY DIAG:  Difficulty in walking, not elsewhere classified  Muscle weakness (generalized)  History of falling  Rationale for Evaluation and Treatment: Rehabilitation  SUBJECTIVE:                                                                                                                                                                                             SUBJECTIVE STATEMENT:   Pt reports doing okay this date with minimal reports of knee pain. Did have a fall last week but reportedly sustained no injury just took some time to get off the floor but she managed. Fall was in post direction.    PERTINENT HISTORY:   Of note, pt also undergoing treatment for progressive endometrial cancer with cervical and vaginal involvement.   Per neurology note 01/20/24: Patient with PMH significant for hypertension, hyperlipidemia, diabetes, endometrial cancer (currently undergoing treatment). Patient presents for follow-up with ongoing imbalance and weakness. She did not start PT following last office visit given transportation issues. She is no longer taking gabapentin, however she is taking Lyrica 150 mg twice daily per external provider. Patient has previously been diagnosed with chronic severe sensorimotor polyneuropathy as well as advanced lumbar spine degeneration. Again offered referral to physiatry, patient defers at this time Again discussed referral to outpatient physical therapy for gait and balance training, however patient wishes to hold off at this time. Continue Lyrica 150 mg twice daily per external provider.    PAIN:  Are you having pain? No pain at rest, but stops walking/standing several times in session due to Left knee pain (non during STS exercises)   PRECAUTIONS: Fall and Other: pt reporting having port for chemo  WEIGHT  BEARING RESTRICTIONS: No  FALLS: Has patient fallen in last 6 months? Pt unsure of when last fall was, estimates to be about last year. Pt notes that she did slip the other day where she knocked her bookcase over; pt reports no injuries.  LIVING ENVIRONMENT: Lives with: lives alone Lives in: House/apartment - town house Stairs: Yes: Internal: ~12 steps; on right going up and External: 3 steps; on right going up Has following equipment at home: Counselling psychologist, Walker - 2 wheeled, shower chair, and Grab bars  PLOF: Independent with basic ADLs  PATIENT GOALS: walk normally   OBJECTIVE:  Note: Objective measures were completed at Evaluation unless otherwise noted.  DIAGNOSTIC FINDINGS:  Per neurology note 01/20/24: 10/08/2023 EMG IMPRESSION: There is electrodiagnostic evidence of severe sensorimotor polyneuropathy in the lower extremities.  09/06/2023 CT LUMBAR SPINE WO IMPRESSION:  1. No acute osseous abnormality in the Lumbar Spine. Chronic L1  compression fracture, lower thoracic hyperostosis related interbody  ankylosis including at T12-L1.  2. Advanced lumbar spine degeneration L3-L4 through L5-S1. Moderate  multifactorial spinal stenosis suspected at L3-L4, Mild at L4-L5.  Moderate bilateral L3, moderate to severe bilateral L4, and very  severe bilateral L5 nerve level foraminal stenoses.  3. Aortic Atherosclerosis (ICD10-I70.0).   06/05/2023 CT HEAD AND C SPINE WO CONTRAST IMPRESSION:  1. No acute intracranial pathology. Small-vessel white matter  disease.  2. Soft tissue contusion and hematoma of the right forehead.  3. No fracture or static subluxation of the cervical spine.  4. Moderate multilevel cervical disc degenerative disease.   12/19/2022 CT HEAD AND C SPINE WO CONTRAST IMPRESSION:  1. No acute intracranial abnormality.  2. Right orbital floor fracture with herniation of the inferior  rectus muscle body through the fracture site. Findings can be  seen  in the setting of entrapment. Recommend ophthalmologic consultation.  3. Air in the intraconal compartment of the orbit abutting the right  optic nerve. No evidence of an intraorbital hematoma.  4. No acute fracture or traumatic subluxation of the cervical spine.  5. Subcutaneous emphysema that extends inferiorly to the level of  the thyroid  and superiorly to the level of the periorbital soft  tissues. This may be secondary to an additional occult maxillary  sinus injury, possibly along the anterior wall.    COGNITION: Overall cognitive status: pt reporting difficulty with memory following recent chemo; unable to provide clear timeline of recent falls/stumbles   GAIT: Findings: Gait Characteristics: decreased stride length and narrow BOS, Distance walked: distance required for assessment, Assistive device utilized:Walker - 2 wheeled, and Level of assistance: CGA and Min A  FUNCTIONAL TESTS:  5 times sit to stand: 16.57 with RW for steadying, decreased eccentric control Timed up and go (TUG): to be assessed 3 minute walk test: to be assessed 10 meter walk test:  0.24 with RW, CGA-minA for posterior/R lateral LOBs   PATIENT SURVEYS:  ABC scale 11/5: 46%                                                                                                                             TREATMENT DATE 05/14/24:   TA- To improve functional movements patterns for everyday tasks    Transfer  with CGA to nustep from transport chair  Nustel level 1-4 double hill interval training x 8 min - cues to continue pace- pt transitions from UE to no UE to UE again as her arms and legs fatigued respectively. Encouraged this was intent of activity   Gait to chair - CGA, with RW ~83ft   Standing march with 3# AW - 2 x 10   Standing hip ABD 2 x 10 with 3# AW   TE- To improve strength, endurance, mobility, and function of specific targeted muscle groups or improve joint range of motion or improve muscle  flexibility - with all activities pt requires external cues for proper completion   Seated LAQ 3 x 10 with 3#AW  Seated HS curl x 10 ea RTB - heavy cues    NMR: To facilitate reeducation of movement, balance, posture, coordination, and/or proprioception/kinesthetic sense.   Gait over 1/2 foam rollers for increased attention to feet with object navigation- UE assist allowed.    Unless otherwise stated, CGA was provided and gait belt donned in order to ensure pt safety   Pt required occasional rest breaks due fatigue, PT was attentive to when pt appeared to be tired or winded in order to prevent excessive fatigue. Pt also has knee pain that is easily exacerbated so alternated between standing and seated exercises when possible.   PATIENT EDUCATION: Education details: Heavy cues throughout, instruction that we will avoid knee pain and exacerbations as much as we are able.  Person educated: Patient Education method: Explanation and Verbal cues Education comprehension: verbalized understanding and needs further education  HOME EXERCISE PROGRAM: Access Code: [email or web address] URL: [email or web address] Date: 03/06/2024 Prepared by: Lonni Gainer  Exercises - Sit to stand with counter or walker support in front of you  - 1 x daily - 7 x weekly - 2 sets - 6 reps - Seated March  - 1 x daily - 7 x weekly - 2 sets - 10 reps - Seated Long Arc Quad  - 1 x daily - 7 x weekly - 2 sets - 10 reps  GOALS: Goals reviewed with patient? Yes  SHORT TERM GOALS: Target date: 04/15/24  Patient will be independent with home exercise program to improve strength/mobility for increased functional independence with ADLs and mobility. Baseline: to be assessed Goal status: INITIAL   LONG TERM GOALS: Target date: 05/27/24  Patient (> 38 years old) will complete five times sit to stand test (5XSTS) in < 15 seconds indicating an increased LE strength and improved balance. Baseline: 16.57 seconds  BUE support at walker upon standing, decreased eccentric control Goal status: INITIAL  2.  Patient will increase Berg Balance score by > 6 points to demonstrate decreased fall risk during functional activities. Baseline: 25/56 Goal status: INITIAL  3.  Patient will increase 3 minute walk test distance to >365ft for progression to community level ambulation, demonstrating improved gait endurance. Baseline: Completed 2:17 min; 123ft Goal status: INITIAL  4.  Patient will increase 10 meter walk test to >0.6 m/s as to improve gait speed for better community ambulation and to reduce fall risk. Baseline: 0.24 m/s with RW, CGA-minA for posterior/R lateral LOBs Goal status: INITIAL  5.  Patient will reduce timed up and go to <15 seconds to reduce fall risk and demonstrate improved transfer/gait ability. Baseline: 20.77sec Goal status: INITIAL  6.  Patient will improve ABC scale score  by >20% to demonstrate increased confidence with functional mobility and ADLs Baseline: 46.25% Goal status: INITIAL  ASSESSMENT:  CLINICAL IMPRESSION:  Patient arrived with good motivation for completion of pt activities.  Pt continues with progressive strengthening and balance activities. Pt had no left knee complaints this date with any activities.  Will continue to progress with more standing and balance related activities as patient is able.  Patient does continue to require attention and cues to task at hand otherwise she begins doing irrelevant exercises with poor form. Pt will continue to benefit from skilled physical therapy intervention to address impairments, improve QOL, and attain therapy goals.       OBJECTIVE IMPAIRMENTS: Abnormal gait, decreased activity tolerance, decreased balance, decreased coordination, decreased endurance, decreased knowledge of condition, decreased mobility, difficulty walking, decreased strength, and decreased safety awareness.   ACTIVITY LIMITATIONS: carrying, lifting,  stairs, transfers, and locomotion level  PARTICIPATION LIMITATIONS: meal prep, cleaning, laundry, shopping, and community activity  PERSONAL FACTORS: Age, Sex, and 3+ comorbidities: CA, HTN, COPD, DM, depression are also affecting patient's functional outcome.   REHAB POTENTIAL: Fair    CLINICAL DECISION MAKING: Evolving/moderate complexity  EVALUATION COMPLEXITY: Moderate  PLAN:  PT FREQUENCY: 1-2x/week  PT DURATION: 12 weeks  PLANNED INTERVENTIONS: 97164- PT Re-evaluation, 97750- Physical Performance Testing, 97110-Therapeutic exercises, 97530- Therapeutic activity, 97112- Neuromuscular re-education, 97535- Self Care, 02859- Manual therapy, [phone]- Gait training, [phone]- Canalith repositioning, Patient/Family education, Balance training, Stair training, Joint mobilization, Vestibular training, Cryotherapy, and Moist heat  PLAN FOR NEXT SESSION:  -Assess stair navigation -Continue to provide education on deficits, promote safety awareness & use of AD -concerned about pt ability to safely come to PT here, but will continue to manage over next few sessions -Monitor knee pain (most limiting per pt with standing exercises) -Standing balance as tolerated  Note: Portions of this document were prepared using Dragon voice recognition software and although reviewed may contain unintentional dictation errors in syntax, grammar, or spelling.   Lonni KATHEE Gainer, PT 05/14/2024 11:54 AM   "

## 2024-05-14 NOTE — Progress Notes (Signed)
 Brief Telephone Documentation Reason for Call: Patient left message regarding question for pharmacist - help with Patient assistance  Summary of Call: [facility] Patient Advocate team did reach out to patient today regarding need for patient signature on [employer] and [employer] patient assistance renewals for 2026. Patient noted she lost the applications again and would like to speak to pharmacist.   Terri [name] Per letter dated Jan 2 in Media tab, [facility] has received provider pages, just missing patient pages.  Patient portion was submitted online 12/24/226 with ID: [id number] Called [employer] 05/14/24: They opened application and confirmed they do see both provider and patient signatures. They are sending application for re-processing and note a response should be received 3-5 business days   [employer]: Provider portion submitted previously, scanned into Media tab [employer] removed their online enrollment option Patient will come to clinic Friday or next week to sign in person.    [employer]: Provider portion signed previously Patient will come to clinic Friday or next week to sign in person.    Patient confirms she was able to get her new glucometer in 2026 though [employer].    Follow Up: Patient given direct line for further questions/concerns.  Terri [name], PharmD, BCACP, CPP Clinical Pharmacist Practitioner [facility] Ph: [phone]

## 2024-05-15 ENCOUNTER — Ambulatory Visit (INDEPENDENT_AMBULATORY_CARE_PROVIDER_SITE_OTHER): Admitting: Vascular Surgery

## 2024-05-15 ENCOUNTER — Encounter (INDEPENDENT_AMBULATORY_CARE_PROVIDER_SITE_OTHER): Payer: Self-pay | Admitting: Vascular Surgery

## 2024-05-15 ENCOUNTER — Encounter: Payer: Self-pay | Admitting: Oncology

## 2024-05-15 VITALS — BP 163/73 | HR 58 | Resp 18

## 2024-05-15 DIAGNOSIS — I639 Cerebral infarction, unspecified: Secondary | ICD-10-CM | POA: Diagnosis not present

## 2024-05-15 DIAGNOSIS — E118 Type 2 diabetes mellitus with unspecified complications: Secondary | ICD-10-CM | POA: Diagnosis not present

## 2024-05-15 DIAGNOSIS — I70213 Atherosclerosis of native arteries of extremities with intermittent claudication, bilateral legs: Secondary | ICD-10-CM

## 2024-05-15 DIAGNOSIS — I6529 Occlusion and stenosis of unspecified carotid artery: Secondary | ICD-10-CM | POA: Insufficient documentation

## 2024-05-15 DIAGNOSIS — I152 Hypertension secondary to endocrine disorders: Secondary | ICD-10-CM | POA: Diagnosis not present

## 2024-05-15 DIAGNOSIS — I6523 Occlusion and stenosis of bilateral carotid arteries: Secondary | ICD-10-CM | POA: Diagnosis not present

## 2024-05-15 DIAGNOSIS — E1159 Type 2 diabetes mellitus with other circulatory complications: Secondary | ICD-10-CM | POA: Diagnosis not present

## 2024-05-15 NOTE — Telephone Encounter (Signed)
 PAP: Patient assistance application for Trulicity has been approved by PAP Companies: [employer] from 04/30/2024 to 04/29/2025. Medication should be delivered to PAP Delivery: Home. For further shipping updates, please contact [employer] at [phone]. Patient ID is: [id number]

## 2024-05-15 NOTE — Progress Notes (Signed)
 "   MRN : [id number]  Terri [name] is a 74 y.o. (Mar 10, 1951) female who presents with chief complaint of  Chief Complaint  Patient presents with   Follow-up    2 week follow up  .  History of Present Illness:   Discussed the use of AI scribe software for clinical note transcription with the patient, who gave verbal consent to proceed.  History of Present Illness Terri [name] is a 74 year old female with carotid artery stenosis and peripheral arterial disease who presents for vascular surgery follow-up after recent cerebral infarction.  Several weeks ago she was found to have a basically asymptomatic cerebral infarction incidentally on imaging. CT and MRI during her hospitalization confirmed the infarction and identified carotid artery stenosis in the 50% range on the right (affected) side.  She is taking aspirin  and a cholesterol-lowering medication. She recently transitioned from chemotherapy to an oral oncology medication, name unknown to her.  She has peripheral arterial disease with intermittent claudication causing leg pain with walking, but she can continue to ambulate through the discomfort. She denies significant or acutely worsening leg pain and feels her legs have been stable.    Results Radiology Neck CT (03/2024): Carotid artery stenosis, roughly 50% (Independently interpreted) Brain MRI (03/2024): Recent cerebral infarction (Independently interpreted)  Current Outpatient Medications  Medication Sig Dispense Refill   albuterol  (VENTOLIN  HFA) 108 (90 Base) MCG/ACT inhaler Inhale 2 puffs into the lungs every 6 (six) hours as needed for wheezing or shortness of breath. 1 each 1   amLODipine  (NORVASC ) 10 MG tablet Take 1 tablet (10 mg total) by mouth daily. 90 tablet 3   aspirin  EC 81 MG tablet Take 81 mg by mouth daily. Swallow whole.     atorvastatin  (LIPITOR) 80 MG tablet Take 1 tablet (80 mg total) by mouth daily. 90 tablet 3   Blood Glucose Monitoring Suppl DEVI Use to  check blood glucose as instructed. May substitute to any manufacturer covered by patient's 2026 insurance (Accuchek or Contour meters =$0). E11.65. Z79.4 1 each 0   budesonide -glycopyrrolate -formoterol  (BREZTRI ) 160-9-4.8 MCG/ACT AERO inhaler Inhale 2 puffs into the lungs 2 (two) times daily. Via PAP AZ&Me     buPROPion  (WELLBUTRIN  XL) 300 MG 24 hr tablet Take 1 tablet (300 mg total) by mouth daily. 90 tablet 0   Cholecalciferol  (VITAMIN D3) 50 MCG (2000 UT) capsule Take 2,000 Units by mouth daily.     citalopram  (CELEXA ) 20 MG tablet Take 1 tablet (20 mg total) by mouth daily. 90 tablet 3   cloNIDine  (CATAPRES ) 0.2 MG tablet Take 1 tablet (0.2 mg total) by mouth 2 (two) times daily. 180 tablet 3   cyanocobalamin  (VITAMIN B12) 1000 MCG tablet Take 1 tablet (1,000 mcg total) by mouth daily. 90 tablet 3   glucose blood test strip Use as instructed to test blood sugar 4 times daily. 100 each 12   insulin  aspart (NOVOLOG ) 100 UNIT/ML injection Inject 8 Units into the skin 3 (three) times daily before meals.     insulin  degludec (TRESIBA  FLEXTOUCH) 100 UNIT/ML FlexTouch Pen Inject 20 Units into the skin daily. 20 units once daily - REPLACES Levemir      Lancets MISC Use to check blood sugar up to four times daily as directed. E11.65, Z79.4. (2026 insurance prefers Contour or Accuchek =$0) 100 each 11   losartan -hydrochlorothiazide  (HYZAAR) 100-25 MG tablet Take 1 tablet by mouth daily. 90 tablet 3   megestrol  (MEGACE ) 40 MG tablet Take 2 tablets (80 mg total)  by mouth 2 (two) times daily. 120 tablet 1   metoprolol  succinate (TOPROL -XL) 50 MG 24 hr tablet Take 1 tablet (50 mg total) by mouth daily. 90 tablet 0   montelukast  (SINGULAIR ) 10 MG tablet Take 10 mg by mouth at bedtime.     ondansetron  (ZOFRAN ) 8 MG tablet Take 1 tablet (8 mg total) by mouth every 8 (eight) hours as needed for nausea or vomiting. 30 tablet 0   No current facility-administered medications for this visit.    Past Medical  History:  Diagnosis Date   Adenomatous colon polyp    Aortic stenosis    Arthritis    Asthma    Cancer (HCC)    Claudication    COPD (chronic obstructive pulmonary disease) (HCC)    Depression    Diabetes mellitus without complication (HCC)    Esophageal dysphagia    GERD (gastroesophageal reflux disease)    Hyperlipemia    Hypertension    Obesity    Severe obesity (BMI 35.0-35.9 with comorbidity) (HCC)     Past Surgical History:  Procedure Laterality Date   BLADDER SURGERY     COLONOSCOPY WITH PROPOFOL  N/A 08/05/2015   Procedure: COLONOSCOPY WITH PROPOFOL ;  Surgeon: Donnice Vaughn Manes, MD;  Location: [facility] ENDOSCOPY;  Service: Endoscopy;  Laterality: N/A;   ESOPHAGOGASTRODUODENOSCOPY N/A 01/09/2022   Procedure: ESOPHAGOGASTRODUODENOSCOPY (EGD);  Surgeon: Maryruth Ole DASEN, MD;  Location: [facility] ENDOSCOPY;  Service: Endoscopy;  Laterality: N/A;   FOOT SURGERY Left    FRACTURE SURGERY     IR CV LINE INJECTION  06/22/2022   IR IMAGING GUIDED PORT INSERTION  06/05/2022   IR PORT REPAIR CENTRAL VENOUS ACCESS DEVICE  07/02/2022   LOWER EXTREMITY ANGIOGRAPHY Left 02/04/2023   Procedure: Lower Extremity Angiography;  Surgeon: Marea Selinda RAMAN, MD;  Location: [facility] INVASIVE CV LAB;  Service: Cardiovascular;  Laterality: Left;   ORIF ANKLE FRACTURE Left 03/26/2017   Procedure: OPEN REDUCTION INTERNAL FIXATION (ORIF) ANKLE FRACTURE;  Surgeon: Edie Norleen PARAS, MD;  Location: [facility] ORS;  Service: Orthopedics;  Laterality: Left;     Social History[1]    Family History  Problem Relation Age of Onset   CVA Mother    Diabetes Mother    CAD Father    Breast cancer Neg Hx      Allergies[2]   REVIEW OF SYSTEMS (Negative unless checked)   Constitutional: [] Weight loss  [] Fever  [] Chills Cardiac: [] Chest pain   [] Chest pressure   [] Palpitations   [] Shortness of breath when laying flat   [] Shortness of breath at rest   [x] Shortness of breath with exertion. Vascular:  [x] Pain in legs with walking    [] Pain in legs at rest   [] Pain in legs when laying flat   [x] Claudication   [] Pain in feet when walking  [] Pain in feet at rest  [] Pain in feet when laying flat   [] History of DVT   [] Phlebitis   [] Swelling in legs   [] Varicose veins   [] Non-healing ulcers Pulmonary:   [] Uses home oxygen   [] Productive cough   [] Hemoptysis   [] Wheeze  [x] COPD   [] Asthma Neurologic:  [] Dizziness  [] Blackouts   [] Seizures   [] History of stroke   [] History of TIA  [] Aphasia   [] Temporary blindness   [] Dysphagia   [] Weakness or numbness in arms   [x] Weakness or numbness in legs Musculoskeletal:  [x] Arthritis   [] Joint swelling   [] Joint pain   [] Low back pain Hematologic:  [] Easy bruising  [] Easy bleeding   [] Hypercoagulable  state   [] Anemic   Gastrointestinal:  [] Blood in stool   [] Vomiting blood  [x] Gastroesophageal reflux/heartburn   [] Abdominal pain Genitourinary:  [] Chronic kidney disease   [] Difficult urination  [] Frequent urination  [] Burning with urination   [] Hematuria Skin:  [] Rashes   [] Ulcers   [] Wounds Psychological:  [x] History of anxiety   []  History of major depression.  Physical Examination  BP (!) 163/73 (BP Location: Right Arm)   Pulse (!) 58   Resp 18  Gen:  WD/WN, NAD Head: [place]/AT, No temporalis wasting. Ear/Nose/Throat: Hearing grossly intact, nares w/o erythema or drainage Eyes: Conjunctiva clear. Sclera non-icteric Neck: Supple.  Trachea midline Pulmonary:  Good air movement, no use of accessory muscles.  Cardiac: bradycardic Vascular:  Vessel Right Left  Radial Palpable Palpable                          PT Palpable Palpable  DP Palpable Palpable   Gastrointestinal: soft, non-tender/non-distended. No guarding/reflex.  Musculoskeletal: M/S 5/5 throughout.  No deformity or atrophy. No edema. Neurologic: Sensation grossly intact in extremities.  Symmetrical.  Speech is fluent.  Psychiatric: Judgment intact, Mood & affect appropriate for pt's clinical situation. Dermatologic:  No rashes or ulcers noted.  No cellulitis or open wounds.  Physical Exam     Labs Recent Results (from the past 2160 hours)  Comprehensive metabolic panel with GFR     Status: Abnormal   Collection Time: 02/24/24  9:45 AM  Result Value Ref Range   Sodium 133 (L) 135 - 145 mmol/L   Potassium 3.4 (L) 3.5 - 5.1 mmol/L   Chloride 96 (L) 98 - 111 mmol/L   CO2 28 22 - 32 mmol/L   Glucose, Bld 365 (H) 70 - 99 mg/dL    Comment: Glucose reference range applies only to samples taken after fasting for at least 8 hours.   BUN 19 8 - 23 mg/dL   Creatinine, Ser 9.01 0.44 - 1.00 mg/dL   Calcium  8.5 (L) 8.9 - 10.3 mg/dL   Total Protein 5.8 (L) 6.5 - 8.1 g/dL   Albumin 3.5 3.5 - 5.0 g/dL   AST 19 15 - 41 U/L   ALT 12 0 - 44 U/L   Alkaline Phosphatase 67 38 - 126 U/L   Total Bilirubin 0.7 0.0 - 1.2 mg/dL   GFR, Estimated >39 >39 mL/min    Comment: (NOTE) Calculated using the CKD-EPI Creatinine Equation (2021)    Anion gap 9 5 - 15    Comment: Performed at [facility], [address] [place], [place] [address]  CBC with Differential/Platelet     Status: Abnormal   Collection Time: 02/24/24  9:45 AM  Result Value Ref Range   WBC 4.5 4.0 - 10.5 K/uL   RBC 4.20 3.87 - 5.11 MIL/uL   Hemoglobin 11.6 (L) 12.0 - 15.0 g/dL   HCT 64.1 (L) 63.9 - 53.9 %   MCV 85.2 80.0 - 100.0 fL   MCH 27.6 26.0 - 34.0 pg   MCHC 32.4 30.0 - 36.0 g/dL   RDW 85.5 88.4 - 84.4 %   Platelets 139 (L) 150 - 400 K/uL   nRBC 0.0 0.0 - 0.2 %   Neutrophils Relative % 81 %   Neutro Abs 3.6 1.7 - 7.7 K/uL   Lymphocytes Relative 9 %   Lymphs Abs 0.4 (L) 0.7 - 4.0 K/uL   Monocytes Relative 8 %   Monocytes Absolute 0.4 0.1 - 1.0 K/uL  Eosinophils Relative 2 %   Eosinophils Absolute 0.1 0.0 - 0.5 K/uL   Basophils Relative 0 %   Basophils Absolute 0.0 0.0 - 0.1 K/uL   Immature Granulocytes 0 %   Abs Immature Granulocytes 0.01 0.00 - 0.07 K/uL    Comment: Performed at [facility], [address]  [place], [place] [address]  CMP (Cancer Center only)     Status: Abnormal   Collection Time: 04/06/24 10:24 AM  Result Value Ref Range   Sodium 138 135 - 145 mmol/L   Potassium 3.8 3.5 - 5.1 mmol/L   Chloride 101 98 - 111 mmol/L   CO2 27 22 - 32 mmol/L   Glucose, Bld 206 (H) 70 - 99 mg/dL    Comment: Glucose reference range applies only to samples taken after fasting for at least 8 hours.   BUN 25 (H) 8 - 23 mg/dL   Creatinine 8.91 (H) 9.55 - 1.00 mg/dL   Calcium  9.2 8.9 - 10.3 mg/dL   Total Protein 5.6 (L) 6.5 - 8.1 g/dL   Albumin 3.5 3.5 - 5.0 g/dL   AST 14 (L) 15 - 41 U/L   ALT 11 0 - 44 U/L   Alkaline Phosphatase 82 38 - 126 U/L   Total Bilirubin 0.4 0.0 - 1.2 mg/dL   GFR, Estimated 54 (L) >60 mL/min    Comment: (NOTE) Calculated using the CKD-EPI Creatinine Equation (2021)    Anion gap 10 5 - 15    Comment: Performed at [facility], [address] [place], [place] [address]  CBC with Differential (Cancer Center Only)     Status: Abnormal   Collection Time: 04/06/24 10:25 AM  Result Value Ref Range   WBC Count 5.1 4.0 - 10.5 K/uL   RBC 3.87 3.87 - 5.11 MIL/uL   Hemoglobin 11.0 (L) 12.0 - 15.0 g/dL   HCT 66.9 (L) 63.9 - 53.9 %   MCV 85.3 80.0 - 100.0 fL   MCH 28.4 26.0 - 34.0 pg   MCHC 33.3 30.0 - 36.0 g/dL   RDW 86.2 88.4 - 84.4 %   Platelet Count 179 150 - 400 K/uL   nRBC 0.0 0.0 - 0.2 %   Neutrophils Relative % 81 %   Neutro Abs 4.1 1.7 - 7.7 K/uL   Lymphocytes Relative 8 %   Lymphs Abs 0.4 (L) 0.7 - 4.0 K/uL   Monocytes Relative 9 %   Monocytes Absolute 0.5 0.1 - 1.0 K/uL   Eosinophils Relative 2 %   Eosinophils Absolute 0.1 0.0 - 0.5 K/uL   Basophils Relative 0 %   Basophils Absolute 0.0 0.0 - 0.1 K/uL   Immature Granulocytes 0 %   Abs Immature Granulocytes 0.01 0.00 - 0.07 K/uL    Comment: Performed at [facility], [address] [place], [place] [address]  Protime-INR     Status: None   Collection Time: 04/13/24  3:05 PM  Result Value Ref  Range   Prothrombin Time 13.6 11.4 - 15.2 seconds   INR 1.0 0.8 - 1.2    Comment: (NOTE) INR goal varies based on device and disease states. Performed at [facility], [address] [place], [place] [address]   APTT     Status: None   Collection Time: 04/13/24  3:05 PM  Result Value Ref Range   aPTT 24 24 - 36 seconds    Comment: Performed at [facility], [address], [place], [place] [address]  CBC  Status: None   Collection Time: 04/13/24  3:05 PM  Result Value Ref Range   WBC 5.6 4.0 - 10.5 K/uL   RBC 4.33 3.87 - 5.11 MIL/uL   Hemoglobin 12.3 12.0 - 15.0 g/dL   HCT 62.3 63.9 - 53.9 %   MCV 86.8 80.0 - 100.0 fL   MCH 28.4 26.0 - 34.0 pg   MCHC 32.7 30.0 - 36.0 g/dL   RDW 85.8 88.4 - 84.4 %   Platelets 190 150 - 400 K/uL   nRBC 0.0 0.0 - 0.2 %    Comment: Performed at [facility], [address] [place], [place] [address]  Differential     Status: Abnormal   Collection Time: 04/13/24  3:05 PM  Result Value Ref Range   Neutrophils Relative % 83 %   Neutro Abs 4.7 1.7 - 7.7 K/uL   Lymphocytes Relative 8 %   Lymphs Abs 0.4 (L) 0.7 - 4.0 K/uL   Monocytes Relative 6 %   Monocytes Absolute 0.3 0.1 - 1.0 K/uL   Eosinophils Relative 3 %   Eosinophils Absolute 0.1 0.0 - 0.5 K/uL   Basophils Relative 0 %   Basophils Absolute 0.0 0.0 - 0.1 K/uL   Immature Granulocytes 0 %   Abs Immature Granulocytes 0.02 0.00 - 0.07 K/uL    Comment: Performed at [facility], [address] [place], [place] [address]  Comprehensive metabolic panel     Status: Abnormal   Collection Time: 04/13/24  3:05 PM  Result Value Ref Range   Sodium 139 135 - 145 mmol/L   Potassium 3.8 3.5 - 5.1 mmol/L   Chloride 101 98 - 111 mmol/L   CO2 27 22 - 32 mmol/L   Glucose, Bld 299 (H) 70 - 99 mg/dL    Comment: Glucose reference range applies only to samples taken after fasting for at least 8 hours.   BUN 20 8 - 23 mg/dL   Creatinine, Ser 8.95 (H) 0.44 - 1.00  mg/dL   Calcium  9.3 8.9 - 10.3 mg/dL   Total Protein 6.2 (L) 6.5 - 8.1 g/dL   Albumin 3.9 3.5 - 5.0 g/dL   AST 15 15 - 41 U/L   ALT 11 0 - 44 U/L   Alkaline Phosphatase 110 38 - 126 U/L   Total Bilirubin 0.3 0.0 - 1.2 mg/dL   GFR, Estimated 56 (L) >60 mL/min    Comment: (NOTE) Calculated using the CKD-EPI Creatinine Equation (2021)    Anion gap 10 5 - 15    Comment: Performed at [facility], [address] [place], [place] [address]  Ethanol     Status: None   Collection Time: 04/13/24  3:05 PM  Result Value Ref Range   Alcohol, Ethyl (B) <15 <15 mg/dL    Comment: (NOTE) For medical purposes only. Performed at [facility], [address] [place], [place] [address]   Hemoglobin A1c     Status: Abnormal   Collection Time: 04/13/24  3:05 PM  Result Value Ref Range   Hgb A1c MFr Bld 8.0 (H) 4.8 - 5.6 %    Comment: (NOTE) Diagnosis of Diabetes The following HbA1c ranges recommended by the American Diabetes Association (ADA) may be used as an aid in the diagnosis of diabetes mellitus.  Hemoglobin             Suggested A1C NGSP%              Diagnosis  <5.7  Non Diabetic  5.7-6.4                Pre-Diabetic  >6.4                   Diabetic  <7.0                   Glycemic control for                       adults with diabetes.     Mean Plasma Glucose 182.9 mg/dL    Comment: Performed at [facility], [address], [place], [place] [address]  Glucose, capillary     Status: Abnormal   Collection Time: 04/13/24  9:17 PM  Result Value Ref Range   Glucose-Capillary 252 (H) 70 - 99 mg/dL    Comment: Glucose reference range applies only to samples taken after fasting for at least 8 hours.  Glucose, capillary     Status: Abnormal   Collection Time: 04/13/24 11:53 PM  Result Value Ref Range   Glucose-Capillary 206 (H) 70 - 99 mg/dL    Comment: Glucose reference range applies only to samples taken after fasting for at least 8  hours.  Lipid panel     Status: Abnormal   Collection Time: 04/14/24  5:21 AM  Result Value Ref Range   Cholesterol 140 0 - 200 mg/dL    Comment:        ATP III CLASSIFICATION:  <200     mg/dL   Desirable  799-760  mg/dL   Borderline High  >=759    mg/dL   High           Triglycerides 69 <150 mg/dL   HDL 37 (L) >59 mg/dL   Total CHOL/HDL Ratio 3.8 RATIO   VLDL 14 0 - 40 mg/dL   LDL Cholesterol 89 0 - 99 mg/dL    Comment:        Total Cholesterol/HDL:CHD Risk Coronary Heart Disease Risk Table                     Men   Women  1/2 Average Risk   3.4   3.3  Average Risk       5.0   4.4  2 X Average Risk   9.6   7.1  3 X Average Risk  23.4   11.0        Use the calculated Patient Ratio above and the CHD Risk Table to determine the patient's CHD Risk.        ATP III CLASSIFICATION (LDL):  <100     mg/dL   Optimal  899-870  mg/dL   Near or Above                    Optimal  130-159  mg/dL   Borderline  839-810  mg/dL   High  >809     mg/dL   Very High Performed at [facility], [address] [place], [place] [address]   Glucose, capillary     Status: Abnormal   Collection Time: 04/14/24 10:20 AM  Result Value Ref Range   Glucose-Capillary 298 (H) 70 - 99 mg/dL    Comment: Glucose reference range applies only to samples taken after fasting for at least 8 hours.  Glucose, capillary     Status: Abnormal   Collection Time: 04/14/24 12:10 PM  Result Value Ref Range  Glucose-Capillary 258 (H) 70 - 99 mg/dL    Comment: Glucose reference range applies only to samples taken after fasting for at least 8 hours.  Glucose, capillary     Status: Abnormal   Collection Time: 04/14/24  5:09 PM  Result Value Ref Range   Glucose-Capillary 143 (H) 70 - 99 mg/dL    Comment: Glucose reference range applies only to samples taken after fasting for at least 8 hours.  CMP (Cancer Center only)     Status: Abnormal   Collection Time: 05/11/24  1:12 PM  Result Value Ref Range    Sodium 137 135 - 145 mmol/L   Potassium 4.0 3.5 - 5.1 mmol/L   Chloride 101 98 - 111 mmol/L   CO2 27 22 - 32 mmol/L   Glucose, Bld 256 (H) 70 - 99 mg/dL    Comment: Glucose reference range applies only to samples taken after fasting for at least 8 hours.   BUN 22 8 - 23 mg/dL   Creatinine 9.01 9.55 - 1.00 mg/dL   Calcium  9.3 8.9 - 10.3 mg/dL   Total Protein 5.8 (L) 6.5 - 8.1 g/dL   Albumin 3.7 3.5 - 5.0 g/dL   AST 18 15 - 41 U/L   ALT 9 0 - 44 U/L   Alkaline Phosphatase 102 38 - 126 U/L   Total Bilirubin 0.3 0.0 - 1.2 mg/dL   GFR, Estimated 60 (L) >60 mL/min    Comment: (NOTE) Calculated using the CKD-EPI Creatinine Equation (2021)    Anion gap 9 5 - 15    Comment: Performed at [facility], [address] [place], [place] [address]  CBC with Differential/Platelet     Status: Abnormal   Collection Time: 05/11/24  1:12 PM  Result Value Ref Range   WBC 6.4 4.0 - 10.5 K/uL   RBC 4.10 3.87 - 5.11 MIL/uL   Hemoglobin 11.7 (L) 12.0 - 15.0 g/dL   HCT 65.6 (L) 63.9 - 53.9 %   MCV 83.7 80.0 - 100.0 fL   MCH 28.5 26.0 - 34.0 pg   MCHC 34.1 30.0 - 36.0 g/dL   RDW 86.2 88.4 - 84.4 %   Platelets 188 150 - 400 K/uL   nRBC 0.0 0.0 - 0.2 %   Neutrophils Relative % 81 %   Neutro Abs 5.2 1.7 - 7.7 K/uL   Lymphocytes Relative 9 %   Lymphs Abs 0.6 (L) 0.7 - 4.0 K/uL   Monocytes Relative 7 %   Monocytes Absolute 0.4 0.1 - 1.0 K/uL   Eosinophils Relative 1 %   Eosinophils Absolute 0.1 0.0 - 0.5 K/uL   Basophils Relative 0 %   Basophils Absolute 0.0 0.0 - 0.1 K/uL   Immature Granulocytes 2 %   Abs Immature Granulocytes 0.11 (H) 0.00 - 0.07 K/uL    Comment: Performed at [facility], [address] [place], [place] [address]    Radiology No results found.  Assessment/Plan  Assessment & Plan Atherosclerosis of native arteries of extremities with intermittent claudication No acute intervention needed. - Scheduled follow-up for lower extremity arterial evaluation. Had  normal non-invasives about 3 months ago.  Carotid artery stenosis with recent cerebral infarction Recent cerebral infarction likely due to carotid artery stenosis. Stenosis roughly 50%, managed with antiplatelet and statin therapy. Imaging supports continued medical management. - Planned carotid artery ultrasound in 3-4 months to monitor stenosis. - Continued current antiplatelet and statin therapy. Could add Plavix  as well. - Coordinated carotid ultrasound and lower  extremity arterial evaluation appointments to minimize visits.  Essential hypertension blood pressure control important in reducing the progression of atherosclerotic disease. On appropriate oral medications.     Type 2 diabetes mellitus with complication, with long-term current use of insulin  (HCC) blood glucose control important in reducing the progression of atherosclerotic disease. Also, involved in wound healing. On appropriate medications.     Hyperlipemia, mixed lipid control important in reducing the progression of atherosclerotic disease. Continue statin therapy    Selinda Gu, MD  05/15/2024 1:58 PM    This note was created with Dragon medical transcription system.  Any errors from dictation are purely unintentional     [1]  Social History Tobacco Use   Smoking status: Former    Current packs/day: 1.00    Average packs/day: 1 pack/day for 40.0 years (40.0 ttl pk-yrs)    Types: Cigarettes   Smokeless tobacco: Never   Tobacco comments:    Quite 2015  Vaping Use   Vaping status: Never Used  Substance Use Topics   Alcohol use: No   Drug use: No  [2] No Known Allergies  "

## 2024-05-19 ENCOUNTER — Ambulatory Visit: Admitting: Physical Therapy

## 2024-05-19 DIAGNOSIS — Z9181 History of falling: Secondary | ICD-10-CM

## 2024-05-19 DIAGNOSIS — M6281 Muscle weakness (generalized): Secondary | ICD-10-CM

## 2024-05-19 DIAGNOSIS — R2681 Unsteadiness on feet: Secondary | ICD-10-CM

## 2024-05-19 DIAGNOSIS — R262 Difficulty in walking, not elsewhere classified: Secondary | ICD-10-CM

## 2024-05-19 NOTE — Therapy (Signed)
 " OUTPATIENT PHYSICAL THERAPY TREATMENT  Patient Name: Terri [name] MRN: [id number] DOB:August 18, 1950, 74 y.o., female Today's Date: 05/19/2024  PCP: Gretel App, NP  REFERRING PROVIDER: Thomasena Sherran BROCKS, PA-C  END OF SESSION:   PT End of Session - 05/19/24 0941     Visit Number 9    Number of Visits 24    Date for Recertification  05/27/24    Authorization Type Medicare Part A & B    Progress Note Due on Visit 10    PT Start Time 0933    PT Stop Time 1011    PT Time Calculation (min) 38 min    Equipment Utilized During Treatment Gait belt    Activity Tolerance Patient tolerated treatment well    Behavior During Therapy Restless;Impulsive             Past Medical History:  Diagnosis Date   Adenomatous colon polyp    Aortic stenosis    Arthritis    Asthma    Cancer (HCC)    Claudication    COPD (chronic obstructive pulmonary disease) (HCC)    Depression    Diabetes mellitus without complication (HCC)    Esophageal dysphagia    GERD (gastroesophageal reflux disease)    Hyperlipemia    Hypertension    Obesity    Severe obesity (BMI 35.0-35.9 with comorbidity) (HCC)    Past Surgical History:  Procedure Laterality Date   BLADDER SURGERY     COLONOSCOPY WITH PROPOFOL  N/A 08/05/2015   Procedure: COLONOSCOPY WITH PROPOFOL ;  Surgeon: Donnice Vaughn Manes, MD;  Location: [facility] ENDOSCOPY;  Service: Endoscopy;  Laterality: N/A;   ESOPHAGOGASTRODUODENOSCOPY N/A 01/09/2022   Procedure: ESOPHAGOGASTRODUODENOSCOPY (EGD);  Surgeon: Maryruth Ole DASEN, MD;  Location: [facility] ENDOSCOPY;  Service: Endoscopy;  Laterality: N/A;   FOOT SURGERY Left    FRACTURE SURGERY     IR CV LINE INJECTION  06/22/2022   IR IMAGING GUIDED PORT INSERTION  06/05/2022   IR PORT REPAIR CENTRAL VENOUS ACCESS DEVICE  07/02/2022   LOWER EXTREMITY ANGIOGRAPHY Left 02/04/2023   Procedure: Lower Extremity Angiography;  Surgeon: Marea Selinda RAMAN, MD;  Location: [facility] INVASIVE CV LAB;  Service: Cardiovascular;   Laterality: Left;   ORIF ANKLE FRACTURE Left 03/26/2017   Procedure: OPEN REDUCTION INTERNAL FIXATION (ORIF) ANKLE FRACTURE;  Surgeon: Edie Norleen PARAS, MD;  Location: [facility] ORS;  Service: Orthopedics;  Laterality: Left;   Patient Active Problem List   Diagnosis Date Noted   Carotid stenosis 05/15/2024   History of CVA (cerebrovascular accident) 05/07/2024   Stroke (HCC) 04/13/2024   Chronic kidney disease, stage 3a (HCC) 04/13/2024   Depression 04/13/2024   Overweight (BMI 25.0-29.9) 04/13/2024   Fall at home, initial encounter 04/13/2024   Type II diabetes mellitus with renal manifestations (HCC) 04/13/2024   Meningioma (HCC) 04/13/2024   Neuropathy 02/04/2024   Decreased hearing of both ears 02/04/2024   Rash 07/01/2023   Muscle strain of chest wall 07/01/2023   Peripheral vascular disease 05/14/2023   Personal history of fall 04/28/2023   COPD exacerbation (HCC) 03/29/2023   Dyspnea on exertion 02/02/2023   Vitamin D  deficiency 02/02/2023   Vitamin B 12 deficiency 02/02/2023   Diabetic eye exam (HCC) 02/02/2023   Aortic atherosclerosis 02/02/2023   Bradycardia 01/14/2023   Anemia 09/28/2022   Postmenopausal bleeding 01/30/2022   Endometrial cancer (HCC) 01/25/2022   Mild aortic stenosis 10/12/2020   Acute respiratory failure with hypoxia (HCC) 08/05/2015   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   Class 2 severe obesity  due to excess calories with serious comorbidity in adult 08/19/2013   Obesity, Class II, BMI 35-39.9, with comorbidity 08/19/2013   Chronic obstructive pulmonary disease, unspecified (HCC) 07/31/2012   Asthma with chronic obstructive pulmonary disease (COPD) (HCC) 07/31/2012   Atherosclerosis of native arteries of extremity with intermittent claudication 03/12/2012   Tobacco use 03/12/2012   Hyperlipidemia associated with type 2 diabetes mellitus (HCC) 06/06/2011   Hypertension associated with diabetes (HCC) 06/06/2011   Mood disorder 06/06/2011   Type 2 diabetes with  complication (HCC) 06/06/2011    ONSET DATE: it started when I was a kid - 2003 for imbalance  REFERRING DIAG:  R26.89 (ICD-10-CM) - Imbalance  G62.9 (ICD-10-CM) - Neuropathy   THERAPY DIAG:  No diagnosis found.  Rationale for Evaluation and Treatment: Rehabilitation  SUBJECTIVE:                                                                                                                                                                                             SUBJECTIVE STATEMENT:   Pt reports doing okay this date with minimal reports of knee pain. Did have a fall last week but reportedly sustained no injury just took some time to get off the floor but she managed. Fall was in post direction.    PERTINENT HISTORY:   Of note, pt also undergoing treatment for progressive endometrial cancer with cervical and vaginal involvement.   Per neurology note 01/20/24: Patient with PMH significant for hypertension, hyperlipidemia, diabetes, endometrial cancer (currently undergoing treatment). Patient presents for follow-up with ongoing imbalance and weakness. She did not start PT following last office visit given transportation issues. She is no longer taking gabapentin, however she is taking Lyrica 150 mg twice daily per external provider. Patient has previously been diagnosed with chronic severe sensorimotor polyneuropathy as well as advanced lumbar spine degeneration. Again offered referral to physiatry, patient defers at this time Again discussed referral to outpatient physical therapy for gait and balance training, however patient wishes to hold off at this time. Continue Lyrica 150 mg twice daily per external provider.    PAIN:  Are you having pain? No pain at rest, but stops walking/standing several times in session due to Left knee pain (non during STS exercises)   PRECAUTIONS: Fall and Other: pt reporting having port for chemo  WEIGHT BEARING RESTRICTIONS: No  FALLS: Has  patient fallen in last 6 months? Pt unsure of when last fall was, estimates to be about last year. Pt notes that she did slip the other day where she knocked her bookcase over; pt reports no injuries.   LIVING ENVIRONMENT: Lives with:  lives alone Lives in: House/apartment - town house Stairs: Yes: Internal: ~12 steps; on right going up and External: 3 steps; on right going up Has following equipment at home: Quad cane small base, Walker - 2 wheeled, shower chair, and Grab bars  PLOF: Independent with basic ADLs  PATIENT GOALS: walk normally   OBJECTIVE:  Note: Objective measures were completed at Evaluation unless otherwise noted.  DIAGNOSTIC FINDINGS:  Per neurology note 01/20/24: 10/08/2023 EMG IMPRESSION: There is electrodiagnostic evidence of severe sensorimotor polyneuropathy in the lower extremities.  09/06/2023 CT LUMBAR SPINE WO IMPRESSION:  1. No acute osseous abnormality in the Lumbar Spine. Chronic L1  compression fracture, lower thoracic hyperostosis related interbody  ankylosis including at T12-L1.  2. Advanced lumbar spine degeneration L3-L4 through L5-S1. Moderate  multifactorial spinal stenosis suspected at L3-L4, Mild at L4-L5.  Moderate bilateral L3, moderate to severe bilateral L4, and very  severe bilateral L5 nerve level foraminal stenoses.  3. Aortic Atherosclerosis (ICD10-I70.0).   06/05/2023 CT HEAD AND C SPINE WO CONTRAST IMPRESSION:  1. No acute intracranial pathology. Small-vessel white matter  disease.  2. Soft tissue contusion and hematoma of the right forehead.  3. No fracture or static subluxation of the cervical spine.  4. Moderate multilevel cervical disc degenerative disease.   12/19/2022 CT HEAD AND C SPINE WO CONTRAST IMPRESSION:  1. No acute intracranial abnormality.  2. Right orbital floor fracture with herniation of the inferior  rectus muscle body through the fracture site. Findings can be seen  in the setting of entrapment.  Recommend ophthalmologic consultation.  3. Air in the intraconal compartment of the orbit abutting the right  optic nerve. No evidence of an intraorbital hematoma.  4. No acute fracture or traumatic subluxation of the cervical spine.  5. Subcutaneous emphysema that extends inferiorly to the level of  the thyroid  and superiorly to the level of the periorbital soft  tissues. This may be secondary to an additional occult maxillary  sinus injury, possibly along the anterior wall.    COGNITION: Overall cognitive status: pt reporting difficulty with memory following recent chemo; unable to provide clear timeline of recent falls/stumbles   GAIT: Findings: Gait Characteristics: decreased stride length and narrow BOS, Distance walked: distance required for assessment, Assistive device utilized:Walker - 2 wheeled, and Level of assistance: CGA and Min A  FUNCTIONAL TESTS:  5 times sit to stand: 16.57 with RW for steadying, decreased eccentric control Timed up and go (TUG): to be assessed 3 minute walk test: to be assessed 10 meter walk test:  0.24 with RW, CGA-minA for posterior/R lateral LOBs   PATIENT SURVEYS:  ABC scale 11/5: 46%                                                                                                                             TREATMENT DATE 05/19/24:   TA- To improve functional movements patterns for everyday tasks    Transfer with CGA to nustep  from transport chair  Nustel level 1-4 double hill interval training x 8 min - cues to continue pace- pt transitions from UE to no UE to UE again as her arms and legs fatigued respectively. Encouraged this was intent of activity   Gait to chair - CGA, with RW ~85ft   Attempted march walk but despite verbal and visual ues pt cannot complete with proper form ( she just walks) x 4 laps in / bars with 3# AW   Standing march with 3# AW - 2 x 10   Standing hip ABD 2 x 10 with 3# AW   TE- To improve strength, endurance,  mobility, and function of specific targeted muscle groups or improve joint range of motion or improve muscle flexibility - with all activities pt requires external cues for proper completion   Seated LAQ 3 x 10 with 3#AW  Seated HS curl x 10 ea RTB - heavy cues and eventually only performs through 1/3 of proper ROM despite cues    NMR: To facilitate reeducation of movement, balance, posture, coordination, and/or proprioception/kinesthetic sense.   Gait over 1/2 foam rollers for increased attention to feet with object navigation- UE assist allowed. X 4 laps    Unless otherwise stated, CGA was provided and gait belt donned in order to ensure pt safety   Pt required occasional rest breaks due fatigue, PT was attentive to when pt appeared to be tired or winded in order to prevent excessive fatigue. Pt also has knee pain that is easily exacerbated so alternated between standing and seated exercises when possible.   PATIENT EDUCATION: Education details: Heavy cues throughout, instruction that we will avoid knee pain and exacerbations as much as we are able.  Person educated: Patient Education method: Explanation and Verbal cues Education comprehension: verbalized understanding and needs further education  HOME EXERCISE PROGRAM: Access Code: [email or web address] URL: [email or web address] Date: 03/06/2024 Prepared by: Lonni Gainer  Exercises - Sit to stand with counter or walker support in front of you  - 1 x daily - 7 x weekly - 2 sets - 6 reps - Seated March  - 1 x daily - 7 x weekly - 2 sets - 10 reps - Seated Long Arc Quad  - 1 x daily - 7 x weekly - 2 sets - 10 reps  GOALS: Goals reviewed with patient? Yes  SHORT TERM GOALS: Target date: 04/15/24  Patient will be independent with home exercise program to improve strength/mobility for increased functional independence with ADLs and mobility. Baseline: to be assessed Goal status: INITIAL   LONG TERM GOALS: Target date:  05/27/24  Patient (> 13 years old) will complete five times sit to stand test (5XSTS) in < 15 seconds indicating an increased LE strength and improved balance. Baseline: 16.57 seconds BUE support at walker upon standing, decreased eccentric control Goal status: INITIAL  2.  Patient will increase Berg Balance score by > 6 points to demonstrate decreased fall risk during functional activities. Baseline: 25/56 Goal status: INITIAL  3.  Patient will increase 3 minute walk test distance to >374ft for progression to community level ambulation, demonstrating improved gait endurance. Baseline: Completed 2:17 min; 137ft Goal status: INITIAL  4.  Patient will increase 10 meter walk test to >0.6 m/s as to improve gait speed for better community ambulation and to reduce fall risk. Baseline: 0.24 m/s with RW, CGA-minA for posterior/R lateral LOBs Goal status: INITIAL  5.  Patient will reduce timed up and go to <15 seconds to  reduce fall risk and demonstrate improved transfer/gait ability. Baseline: 20.77sec Goal status: INITIAL  6.  Patient will improve ABC scale score  by >20% to demonstrate increased confidence with functional mobility and ADLs Baseline: 46.25% Goal status: INITIAL  ASSESSMENT:  CLINICAL IMPRESSION:  The patient presents with impaired balance, reduced strength and endurance, and difficulty following verbal and visual instructions for proper exercise form, requiring consistent external cueing and close guard assistance for safety. During todays session, she demonstrated variable effort and frequent form breakdown across both seated and standing therapeutic exercises, including limited ability to maintain correct mechanics during resistance activities and inability to perform march walking with proper technique despite repeated cues. Fatigue and knee pain further limited performance, necessitating alternating between standing and seated tasks and provision of rest breaks. Her gait  and transfer activities required close contact guard assistance and frequent reinforcement for pacing and sequencing. Overall, the patient continues to benefit from skilled physical therapy for movement reeducation, improved motor control, strength, and functional mobility, with ongoing need for simplified instructions, high cueing demand, and close monitoring to prevent excessive fatigue and exacerbation of knee pain.      OBJECTIVE IMPAIRMENTS: Abnormal gait, decreased activity tolerance, decreased balance, decreased coordination, decreased endurance, decreased knowledge of condition, decreased mobility, difficulty walking, decreased strength, and decreased safety awareness.   ACTIVITY LIMITATIONS: carrying, lifting, stairs, transfers, and locomotion level  PARTICIPATION LIMITATIONS: meal prep, cleaning, laundry, shopping, and community activity  PERSONAL FACTORS: Age, Sex, and 3+ comorbidities: CA, HTN, COPD, DM, depression are also affecting patient's functional outcome.   REHAB POTENTIAL: Fair    CLINICAL DECISION MAKING: Evolving/moderate complexity  EVALUATION COMPLEXITY: Moderate  PLAN:  PT FREQUENCY: 1-2x/week  PT DURATION: 12 weeks  PLANNED INTERVENTIONS: [id number]- PT Re-evaluation, 97750- Physical Performance Testing, 97110-Therapeutic exercises, 97530- Therapeutic activity, 97112- Neuromuscular re-education, 97535- Self Care, [id number]- Manual therapy, [phone]- Gait training, [phone]- Canalith repositioning, Patient/Family education, Balance training, Stair training, Joint mobilization, Vestibular training, Cryotherapy, and Moist heat  PLAN FOR NEXT SESSION:  -Assess stair navigation -Continue to provide education on deficits, promote safety awareness & use of AD -concerned about pt ability to safely come to PT here, but will continue to manage over next few sessions -Monitor knee pain (most limiting per pt with standing exercises) -Standing balance as tolerated  Note: Portions of  this document were prepared using Dragon voice recognition software and although reviewed may contain unintentional dictation errors in syntax, grammar, or spelling.   Lonni KATHEE Gainer, PT 05/19/2024 9:41 AM   "

## 2024-05-21 ENCOUNTER — Ambulatory Visit: Admitting: Physical Therapy

## 2024-05-21 NOTE — Therapy (Unsigned)
 " OUTPATIENT PHYSICAL THERAPY TREATMENT  Patient Name: Terri [name] MRN: [id number] DOB:1950-08-21, 74 y.o., female Today's Date: 05/21/2024  PCP: Gretel App, NP  REFERRING PROVIDER: Thomasena Sherran BROCKS, PA-C  END OF SESSION:        Past Medical History:  Diagnosis Date   Adenomatous colon polyp    Aortic stenosis    Arthritis    Asthma    Cancer (HCC)    Claudication    COPD (chronic obstructive pulmonary disease) (HCC)    Depression    Diabetes mellitus without complication (HCC)    Esophageal dysphagia    GERD (gastroesophageal reflux disease)    Hyperlipemia    Hypertension    Obesity    Severe obesity (BMI 35.0-35.9 with comorbidity) (HCC)    Past Surgical History:  Procedure Laterality Date   BLADDER SURGERY     COLONOSCOPY WITH PROPOFOL  N/A 08/05/2015   Procedure: COLONOSCOPY WITH PROPOFOL ;  Surgeon: Donnice Vaughn Manes, MD;  Location: [facility] ENDOSCOPY;  Service: Endoscopy;  Laterality: N/A;   ESOPHAGOGASTRODUODENOSCOPY N/A 01/09/2022   Procedure: ESOPHAGOGASTRODUODENOSCOPY (EGD);  Surgeon: Maryruth Ole DASEN, MD;  Location: [facility] ENDOSCOPY;  Service: Endoscopy;  Laterality: N/A;   FOOT SURGERY Left    FRACTURE SURGERY     IR CV LINE INJECTION  06/22/2022   IR IMAGING GUIDED PORT INSERTION  06/05/2022   IR PORT REPAIR CENTRAL VENOUS ACCESS DEVICE  07/02/2022   LOWER EXTREMITY ANGIOGRAPHY Left 02/04/2023   Procedure: Lower Extremity Angiography;  Surgeon: Marea Selinda RAMAN, MD;  Location: [facility] INVASIVE CV LAB;  Service: Cardiovascular;  Laterality: Left;   ORIF ANKLE FRACTURE Left 03/26/2017   Procedure: OPEN REDUCTION INTERNAL FIXATION (ORIF) ANKLE FRACTURE;  Surgeon: Edie Norleen PARAS, MD;  Location: [facility] ORS;  Service: Orthopedics;  Laterality: Left;   Patient Active Problem List   Diagnosis Date Noted   Carotid stenosis 05/15/2024   History of CVA (cerebrovascular accident) 05/07/2024   Stroke (HCC) 04/13/2024   Chronic kidney disease, stage 3a (HCC) 04/13/2024    Depression 04/13/2024   Overweight (BMI 25.0-29.9) 04/13/2024   Fall at home, initial encounter 04/13/2024   Type II diabetes mellitus with renal manifestations (HCC) 04/13/2024   Meningioma (HCC) 04/13/2024   Neuropathy 02/04/2024   Decreased hearing of both ears 02/04/2024   Rash 07/01/2023   Muscle strain of chest wall 07/01/2023   Peripheral vascular disease 05/14/2023   Personal history of fall 04/28/2023   COPD exacerbation (HCC) 03/29/2023   Dyspnea on exertion 02/02/2023   Vitamin D  deficiency 02/02/2023   Vitamin B 12 deficiency 02/02/2023   Diabetic eye exam (HCC) 02/02/2023   Aortic atherosclerosis 02/02/2023   Bradycardia 01/14/2023   Anemia 09/28/2022   Postmenopausal bleeding 01/30/2022   Endometrial cancer (HCC) 01/25/2022   Mild aortic stenosis 10/12/2020   Acute respiratory failure with hypoxia (HCC) 08/05/2015   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   Class 2 severe obesity due to excess calories with serious comorbidity in adult 08/19/2013   Obesity, Class II, BMI 35-39.9, with comorbidity 08/19/2013   Chronic obstructive pulmonary disease, unspecified (HCC) 07/31/2012   Asthma with chronic obstructive pulmonary disease (COPD) (HCC) 07/31/2012   Atherosclerosis of native arteries of extremity with intermittent claudication 03/12/2012   Tobacco use 03/12/2012   Hyperlipidemia associated with type 2 diabetes mellitus (HCC) 06/06/2011   Hypertension associated with diabetes (HCC) 06/06/2011   Mood disorder 06/06/2011   Type 2 diabetes with complication (HCC) 06/06/2011    ONSET DATE: it started when I was a kid -  2003 for imbalance  REFERRING DIAG:  R26.89 (ICD-10-CM) - Imbalance  G62.9 (ICD-10-CM) - Neuropathy   THERAPY DIAG:  Difficulty in walking, not elsewhere classified  Muscle weakness (generalized)  Unsteadiness on feet  History of falling  Rationale for Evaluation and Treatment: Rehabilitation  SUBJECTIVE:                                                                                                                                                                                              SUBJECTIVE STATEMENT:   Pt reports doing okay this date with minimal reports of knee pain. Did have a fall last week but reportedly sustained no injury just took some time to get off the floor but she managed. Fall was in post direction.    PERTINENT HISTORY:   Of note, pt also undergoing treatment for progressive endometrial cancer with cervical and vaginal involvement.   Per neurology note 01/20/24: Patient with PMH significant for hypertension, hyperlipidemia, diabetes, endometrial cancer (currently undergoing treatment). Patient presents for follow-up with ongoing imbalance and weakness. She did not start PT following last office visit given transportation issues. She is no longer taking gabapentin, however she is taking Lyrica 150 mg twice daily per external provider. Patient has previously been diagnosed with chronic severe sensorimotor polyneuropathy as well as advanced lumbar spine degeneration. Again offered referral to physiatry, patient defers at this time Again discussed referral to outpatient physical therapy for gait and balance training, however patient wishes to hold off at this time. Continue Lyrica 150 mg twice daily per external provider.    PAIN:  Are you having pain? No pain at rest, but stops walking/standing several times in session due to Left knee pain (non during STS exercises)   PRECAUTIONS: Fall and Other: pt reporting having port for chemo  WEIGHT BEARING RESTRICTIONS: No  FALLS: Has patient fallen in last 6 months? Pt unsure of when last fall was, estimates to be about last year. Pt notes that she did slip the other day where she knocked her bookcase over; pt reports no injuries.   LIVING ENVIRONMENT: Lives with: lives alone Lives in: House/apartment - town house Stairs: Yes: Internal: ~12 steps; on right going up and  External: 3 steps; on right going up Has following equipment at home: Counselling psychologist, Walker - 2 wheeled, shower chair, and Grab bars  PLOF: Independent with basic ADLs  PATIENT GOALS: walk normally   OBJECTIVE:  Note: Objective measures were completed at Evaluation unless otherwise noted.  DIAGNOSTIC FINDINGS:  Per neurology note 01/20/24: 10/08/2023 EMG IMPRESSION: There is electrodiagnostic evidence of severe sensorimotor polyneuropathy in  the lower extremities.  09/06/2023 CT LUMBAR SPINE WO IMPRESSION:  1. No acute osseous abnormality in the Lumbar Spine. Chronic L1  compression fracture, lower thoracic hyperostosis related interbody  ankylosis including at T12-L1.  2. Advanced lumbar spine degeneration L3-L4 through L5-S1. Moderate  multifactorial spinal stenosis suspected at L3-L4, Mild at L4-L5.  Moderate bilateral L3, moderate to severe bilateral L4, and very  severe bilateral L5 nerve level foraminal stenoses.  3. Aortic Atherosclerosis (ICD10-I70.0).   06/05/2023 CT HEAD AND C SPINE WO CONTRAST IMPRESSION:  1. No acute intracranial pathology. Small-vessel white matter  disease.  2. Soft tissue contusion and hematoma of the right forehead.  3. No fracture or static subluxation of the cervical spine.  4. Moderate multilevel cervical disc degenerative disease.   12/19/2022 CT HEAD AND C SPINE WO CONTRAST IMPRESSION:  1. No acute intracranial abnormality.  2. Right orbital floor fracture with herniation of the inferior  rectus muscle body through the fracture site. Findings can be seen  in the setting of entrapment. Recommend ophthalmologic consultation.  3. Air in the intraconal compartment of the orbit abutting the right  optic nerve. No evidence of an intraorbital hematoma.  4. No acute fracture or traumatic subluxation of the cervical spine.  5. Subcutaneous emphysema that extends inferiorly to the level of  the thyroid  and superiorly to the level of  the periorbital soft  tissues. This may be secondary to an additional occult maxillary  sinus injury, possibly along the anterior wall.    COGNITION: Overall cognitive status: pt reporting difficulty with memory following recent chemo; unable to provide clear timeline of recent falls/stumbles   GAIT: Findings: Gait Characteristics: decreased stride length and narrow BOS, Distance walked: distance required for assessment, Assistive device utilized:Walker - 2 wheeled, and Level of assistance: CGA and Min A  FUNCTIONAL TESTS:  5 times sit to stand: 16.57 with RW for steadying, decreased eccentric control Timed up and go (TUG): to be assessed 3 minute walk test: to be assessed 10 meter walk test:  0.24 with RW, CGA-minA for posterior/R lateral LOBs   PATIENT SURVEYS:  ABC scale 11/5: 46%                                                                                                                             TREATMENT DATE 05/21/24:   TA- To improve functional movements patterns for everyday tasks    Transfer with CGA to nustep from transport chair  Nustel level 1-4 double hill interval training x 8 min - cues to continue pace- pt transitions from UE to no UE to UE again as her arms and legs fatigued respectively. Encouraged this was intent of activity   Gait to chair - CGA, with RW ~46ft   Attempted march walk but despite verbal and visual ues pt cannot complete with proper form ( she just walks) x 4 laps in / bars with 3# AW   Standing march with 3#  AW - 2 x 10   Standing hip ABD 2 x 10 with 3# AW   TE- To improve strength, endurance, mobility, and function of specific targeted muscle groups or improve joint range of motion or improve muscle flexibility - with all activities pt requires external cues for proper completion   Seated LAQ 3 x 10 with 3#AW  Seated HS curl x 10 ea RTB - heavy cues and eventually only performs through 1/3 of proper ROM despite cues    NMR: To  facilitate reeducation of movement, balance, posture, coordination, and/or proprioception/kinesthetic sense.   Gait over 1/2 foam rollers for increased attention to feet with object navigation- UE assist allowed. X 4 laps    Unless otherwise stated, CGA was provided and gait belt donned in order to ensure pt safety   Pt required occasional rest breaks due fatigue, PT was attentive to when pt appeared to be tired or winded in order to prevent excessive fatigue. Pt also has knee pain that is easily exacerbated so alternated between standing and seated exercises when possible.   PATIENT EDUCATION: Education details: Heavy cues throughout, instruction that we will avoid knee pain and exacerbations as much as we are able.  Person educated: Patient Education method: Explanation and Verbal cues Education comprehension: verbalized understanding and needs further education  HOME EXERCISE PROGRAM: Access Code: [email or web address] URL: [email or web address] Date: 03/06/2024 Prepared by: Lonni Gainer  Exercises - Sit to stand with counter or walker support in front of you  - 1 x daily - 7 x weekly - 2 sets - 6 reps - Seated March  - 1 x daily - 7 x weekly - 2 sets - 10 reps - Seated Long Arc Quad  - 1 x daily - 7 x weekly - 2 sets - 10 reps  GOALS: Goals reviewed with patient? Yes  SHORT TERM GOALS: Target date: 04/15/24  Patient will be independent with home exercise program to improve strength/mobility for increased functional independence with ADLs and mobility. Baseline: to be assessed Goal status: INITIAL   LONG TERM GOALS: Target date: 05/27/24  Patient (> 34 years old) will complete five times sit to stand test (5XSTS) in < 15 seconds indicating an increased LE strength and improved balance. Baseline: 16.57 seconds BUE support at walker upon standing, decreased eccentric control Goal status: INITIAL  2.  Patient will increase Berg Balance score by > 6 points to demonstrate  decreased fall risk during functional activities. Baseline: 25/56 Goal status: INITIAL  3.  Patient will increase 3 minute walk test distance to >31ft for progression to community level ambulation, demonstrating improved gait endurance. Baseline: Completed 2:17 min; 161ft Goal status: INITIAL  4.  Patient will increase 10 meter walk test to >0.6 m/s as to improve gait speed for better community ambulation and to reduce fall risk. Baseline: 0.24 m/s with RW, CGA-minA for posterior/R lateral LOBs Goal status: INITIAL  5.  Patient will reduce timed up and go to <15 seconds to reduce fall risk and demonstrate improved transfer/gait ability. Baseline: 20.77sec Goal status: INITIAL  6.  Patient will improve ABC scale score  by >20% to demonstrate increased confidence with functional mobility and ADLs Baseline: 46.25% Goal status: INITIAL  ASSESSMENT:  CLINICAL IMPRESSION:  The patient presents with impaired balance, reduced strength and endurance, and difficulty following verbal and visual instructions for proper exercise form, requiring consistent external cueing and close guard assistance for safety. During todays session, she demonstrated variable effort and frequent form breakdown across  both seated and standing therapeutic exercises, including limited ability to maintain correct mechanics during resistance activities and inability to perform march walking with proper technique despite repeated cues. Fatigue and knee pain further limited performance, necessitating alternating between standing and seated tasks and provision of rest breaks. Her gait and transfer activities required close contact guard assistance and frequent reinforcement for pacing and sequencing. Overall, the patient continues to benefit from skilled physical therapy for movement reeducation, improved motor control, strength, and functional mobility, with ongoing need for simplified instructions, high cueing demand, and close  monitoring to prevent excessive fatigue and exacerbation of knee pain.     OBJECTIVE IMPAIRMENTS: Abnormal gait, decreased activity tolerance, decreased balance, decreased coordination, decreased endurance, decreased knowledge of condition, decreased mobility, difficulty walking, decreased strength, and decreased safety awareness.   ACTIVITY LIMITATIONS: carrying, lifting, stairs, transfers, and locomotion level  PARTICIPATION LIMITATIONS: meal prep, cleaning, laundry, shopping, and community activity  PERSONAL FACTORS: Age, Sex, and 3+ comorbidities: CA, HTN, COPD, DM, depression are also affecting patient's functional outcome.   REHAB POTENTIAL: Fair    CLINICAL DECISION MAKING: Evolving/moderate complexity  EVALUATION COMPLEXITY: Moderate  PLAN:  PT FREQUENCY: 1-2x/week  PT DURATION: 12 weeks  PLANNED INTERVENTIONS: [id number]- PT Re-evaluation, 97750- Physical Performance Testing, 97110-Therapeutic exercises, 97530- Therapeutic activity, 97112- Neuromuscular re-education, 97535- Self Care, [id number]- Manual therapy, [phone]- Gait training, [phone]- Canalith repositioning, Patient/Family education, Balance training, Stair training, Joint mobilization, Vestibular training, Cryotherapy, and Moist heat  PLAN FOR NEXT SESSION:   -Assess stair navigation -Continue to provide education on deficits, promote safety awareness & use of AD -concerned about pt ability to safely come to PT here, but will continue to manage over next few sessions -Monitor knee pain (most limiting per pt with standing exercises) -Standing balance as tolerated  Note: Portions of this document were prepared using Dragon voice recognition software and although reviewed may contain unintentional dictation errors in syntax, grammar, or spelling.   Lonni KATHEE Gainer, PT 05/21/2024 7:42 AM   "

## 2024-05-26 ENCOUNTER — Ambulatory Visit

## 2024-05-26 NOTE — Therapy (Incomplete)
 " OUTPATIENT PHYSICAL THERAPY TREATMENT/ RECERT   Patient Name: Terri [name] MRN: [id number] DOB:Oct 18, 1950, 74 y.o., female Today's Date: 05/26/2024  PCP: Gretel App, NP  REFERRING PROVIDER: Thomasena Sherran BROCKS, PA-C  END OF SESSION:        Past Medical History:  Diagnosis Date   Adenomatous colon polyp    Aortic stenosis    Arthritis    Asthma    Cancer (HCC)    Claudication    COPD (chronic obstructive pulmonary disease) (HCC)    Depression    Diabetes mellitus without complication (HCC)    Esophageal dysphagia    GERD (gastroesophageal reflux disease)    Hyperlipemia    Hypertension    Obesity    Severe obesity (BMI 35.0-35.9 with comorbidity) (HCC)    Past Surgical History:  Procedure Laterality Date   BLADDER SURGERY     COLONOSCOPY WITH PROPOFOL  N/A 08/05/2015   Procedure: COLONOSCOPY WITH PROPOFOL ;  Surgeon: Donnice Vaughn Manes, MD;  Location: [facility] ENDOSCOPY;  Service: Endoscopy;  Laterality: N/A;   ESOPHAGOGASTRODUODENOSCOPY N/A 01/09/2022   Procedure: ESOPHAGOGASTRODUODENOSCOPY (EGD);  Surgeon: Maryruth Ole DASEN, MD;  Location: [facility] ENDOSCOPY;  Service: Endoscopy;  Laterality: N/A;   FOOT SURGERY Left    FRACTURE SURGERY     IR CV LINE INJECTION  06/22/2022   IR IMAGING GUIDED PORT INSERTION  06/05/2022   IR PORT REPAIR CENTRAL VENOUS ACCESS DEVICE  07/02/2022   LOWER EXTREMITY ANGIOGRAPHY Left 02/04/2023   Procedure: Lower Extremity Angiography;  Surgeon: Marea Selinda RAMAN, MD;  Location: [facility] INVASIVE CV LAB;  Service: Cardiovascular;  Laterality: Left;   ORIF ANKLE FRACTURE Left 03/26/2017   Procedure: OPEN REDUCTION INTERNAL FIXATION (ORIF) ANKLE FRACTURE;  Surgeon: Edie Norleen PARAS, MD;  Location: [facility] ORS;  Service: Orthopedics;  Laterality: Left;   Patient Active Problem List   Diagnosis Date Noted   Carotid stenosis 05/15/2024   History of CVA (cerebrovascular accident) 05/07/2024   Stroke (HCC) 04/13/2024   Chronic kidney disease, stage 3a (HCC)  04/13/2024   Depression 04/13/2024   Overweight (BMI 25.0-29.9) 04/13/2024   Fall at home, initial encounter 04/13/2024   Type II diabetes mellitus with renal manifestations (HCC) 04/13/2024   Meningioma (HCC) 04/13/2024   Neuropathy 02/04/2024   Decreased hearing of both ears 02/04/2024   Rash 07/01/2023   Muscle strain of chest wall 07/01/2023   Peripheral vascular disease 05/14/2023   Personal history of fall 04/28/2023   COPD exacerbation (HCC) 03/29/2023   Dyspnea on exertion 02/02/2023   Vitamin D  deficiency 02/02/2023   Vitamin B 12 deficiency 02/02/2023   Diabetic eye exam (HCC) 02/02/2023   Aortic atherosclerosis 02/02/2023   Bradycardia 01/14/2023   Anemia 09/28/2022   Postmenopausal bleeding 01/30/2022   Endometrial cancer (HCC) 01/25/2022   Mild aortic stenosis 10/12/2020   Acute respiratory failure with hypoxia (HCC) 08/05/2015   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   Class 2 severe obesity due to excess calories with serious comorbidity in adult 08/19/2013   Obesity, Class II, BMI 35-39.9, with comorbidity 08/19/2013   Chronic obstructive pulmonary disease, unspecified (HCC) 07/31/2012   Asthma with chronic obstructive pulmonary disease (COPD) (HCC) 07/31/2012   Atherosclerosis of native arteries of extremity with intermittent claudication 03/12/2012   Tobacco use 03/12/2012   Hyperlipidemia associated with type 2 diabetes mellitus (HCC) 06/06/2011   Hypertension associated with diabetes (HCC) 06/06/2011   Mood disorder 06/06/2011   Type 2 diabetes with complication (HCC) 06/06/2011    ONSET DATE: it started when I was a  kid - 2003 for imbalance  REFERRING DIAG:  R26.89 (ICD-10-CM) - Imbalance  G62.9 (ICD-10-CM) - Neuropathy   THERAPY DIAG:  No diagnosis found.  Rationale for Evaluation and Treatment: Rehabilitation  SUBJECTIVE:                                                                                                                                                                                              SUBJECTIVE STATEMENT:   ***   PERTINENT HISTORY:   Of note, pt also undergoing treatment for progressive endometrial cancer with cervical and vaginal involvement.   Per neurology note 01/20/24: Patient with PMH significant for hypertension, hyperlipidemia, diabetes, endometrial cancer (currently undergoing treatment). Patient presents for follow-up with ongoing imbalance and weakness. She did not start PT following last office visit given transportation issues. She is no longer taking gabapentin, however she is taking Lyrica 150 mg twice daily per external provider. Patient has previously been diagnosed with chronic severe sensorimotor polyneuropathy as well as advanced lumbar spine degeneration. Again offered referral to physiatry, patient defers at this time Again discussed referral to outpatient physical therapy for gait and balance training, however patient wishes to hold off at this time. Continue Lyrica 150 mg twice daily per external provider.    PAIN:  Are you having pain? No pain at rest, but stops walking/standing several times in session due to Left knee pain (non during STS exercises)   PRECAUTIONS: Fall and Other: pt reporting having port for chemo  WEIGHT BEARING RESTRICTIONS: No  FALLS: Has patient fallen in last 6 months? Pt unsure of when last fall was, estimates to be about last year. Pt notes that she did slip the other day where she knocked her bookcase over; pt reports no injuries.   LIVING ENVIRONMENT: Lives with: lives alone Lives in: House/apartment - town house Stairs: Yes: Internal: ~12 steps; on right going up and External: 3 steps; on right going up Has following equipment at home: Counselling psychologist, Walker - 2 wheeled, shower chair, and Grab bars  PLOF: Independent with basic ADLs  PATIENT GOALS: walk normally   OBJECTIVE:  Note: Objective measures were completed at Evaluation unless otherwise  noted.  DIAGNOSTIC FINDINGS:  Per neurology note 01/20/24: 10/08/2023 EMG IMPRESSION: There is electrodiagnostic evidence of severe sensorimotor polyneuropathy in the lower extremities.  09/06/2023 CT LUMBAR SPINE WO IMPRESSION:  1. No acute osseous abnormality in the Lumbar Spine. Chronic L1  compression fracture, lower thoracic hyperostosis related interbody  ankylosis including at T12-L1.  2. Advanced lumbar spine degeneration L3-L4 through L5-S1. Moderate  multifactorial spinal stenosis suspected at L3-L4, Mild  at L4-L5.  Moderate bilateral L3, moderate to severe bilateral L4, and very  severe bilateral L5 nerve level foraminal stenoses.  3. Aortic Atherosclerosis (ICD10-I70.0).   06/05/2023 CT HEAD AND C SPINE WO CONTRAST IMPRESSION:  1. No acute intracranial pathology. Small-vessel white matter  disease.  2. Soft tissue contusion and hematoma of the right forehead.  3. No fracture or static subluxation of the cervical spine.  4. Moderate multilevel cervical disc degenerative disease.   12/19/2022 CT HEAD AND C SPINE WO CONTRAST IMPRESSION:  1. No acute intracranial abnormality.  2. Right orbital floor fracture with herniation of the inferior  rectus muscle body through the fracture site. Findings can be seen  in the setting of entrapment. Recommend ophthalmologic consultation.  3. Air in the intraconal compartment of the orbit abutting the right  optic nerve. No evidence of an intraorbital hematoma.  4. No acute fracture or traumatic subluxation of the cervical spine.  5. Subcutaneous emphysema that extends inferiorly to the level of  the thyroid  and superiorly to the level of the periorbital soft  tissues. This may be secondary to an additional occult maxillary  sinus injury, possibly along the anterior wall.    COGNITION: Overall cognitive status: pt reporting difficulty with memory following recent chemo; unable to provide clear timeline of recent  falls/stumbles   GAIT: Findings: Gait Characteristics: decreased stride length and narrow BOS, Distance walked: distance required for assessment, Assistive device utilized:Walker - 2 wheeled, and Level of assistance: CGA and Min A  FUNCTIONAL TESTS:  5 times sit to stand: 16.57 with RW for steadying, decreased eccentric control Timed up and go (TUG): to be assessed 3 minute walk test: to be assessed 10 meter walk test:  0.24 with RW, CGA-minA for posterior/R lateral LOBs   PATIENT SURVEYS:  ABC scale 11/5: 46%                                                                                                                             TREATMENT DATE 05/26/24:  Physical therapy treatment session today consisted of completing assessment of goals and administration of testing as demonstrated and documented in flow sheet, treatment, and goals section of this note. Addition treatments may be found below.   ABC: PT instructed pt in TUG: *** sec (average of 3 trials; >13.5 sec indicates increased fall risk) 10 Meter Walk Test: Patient instructed to walk 10 meters (32.8 ft) as quickly and as safely as possible at their normal speed x2 and at a fast speed x2. Time measured from 2 meter mark to 8 meter mark to accommodate ramp-up and ramp-down.  Normal speed 1: *** m/s Normal speed 2: *** m/s Average Normal speed: *** m/s Fast speed 1: *** m/s Fast speed 2: *** m/s Average Fast speed: *** m/s Cut off scores: <0.4 m/s = household Ambulator, 0.4-0.8 m/s = limited community Ambulator, >0.8 m/s = community Ambulator, >1.2 m/s = crossing a street, <1.0 = increased  fall risk MCID 0.05 m/s (small), 0.13 m/s (moderate), 0.06 m/s (significant)  (ANPTA Core Set of Outcome Measures for Adults with Neurologic Conditions, 2018)  3 min walk test:  BERG:  Pt performed 5 time sit<>stand (5xSTS): *** sec (>15 sec indicates increased fall risk)   TA- To improve functional movements patterns for everyday tasks     Transfer with CGA to nustep from transport chair  Nustel level 1-4 double hill interval training x 8 min - cues to continue pace- pt transitions from UE to no UE to UE again as her arms and legs fatigued respectively. Encouraged this was intent of activity   Gait to chair - CGA, with RW ~41ft   Attempted march walk but despite verbal and visual ues pt cannot complete with proper form ( she just walks) x 4 laps in / bars with 3# AW   Standing march with 3# AW - 2 x 10   Standing hip ABD 2 x 10 with 3# AW   TE- To improve strength, endurance, mobility, and function of specific targeted muscle groups or improve joint range of motion or improve muscle flexibility - with all activities pt requires external cues for proper completion   Seated LAQ 3 x 10 with 3#AW  Seated HS curl x 10 ea RTB - heavy cues and eventually only performs through 1/3 of proper ROM despite cues    NMR: To facilitate reeducation of movement, balance, posture, coordination, and/or proprioception/kinesthetic sense.   Gait over 1/2 foam rollers for increased attention to feet with object navigation- UE assist allowed. X 4 laps    Unless otherwise stated, CGA was provided and gait belt donned in order to ensure pt safety   Pt required occasional rest breaks due fatigue, PT was attentive to when pt appeared to be tired or winded in order to prevent excessive fatigue. Pt also has knee pain that is easily exacerbated so alternated between standing and seated exercises when possible.   PATIENT EDUCATION: Education details: Heavy cues throughout, instruction that we will avoid knee pain and exacerbations as much as we are able.  Person educated: Patient Education method: Explanation and Verbal cues Education comprehension: verbalized understanding and needs further education  HOME EXERCISE PROGRAM: Access Code: [email or web address] URL: [email or web address] Date: 03/06/2024 Prepared by: Lonni Gainer  Exercises - Sit to stand with counter or walker support in front of you  - 1 x daily - 7 x weekly - 2 sets - 6 reps - Seated March  - 1 x daily - 7 x weekly - 2 sets - 10 reps - Seated Long Arc Quad  - 1 x daily - 7 x weekly - 2 sets - 10 reps  GOALS: Goals reviewed with patient? Yes  SHORT TERM GOALS: Target date: 04/15/24  Patient will be independent with home exercise program to improve strength/mobility for increased functional independence with ADLs and mobility. Baseline: to be assessed Goal status: INITIAL   LONG TERM GOALS: Target date: 05/27/24  Patient (> 25 years old) will complete five times sit to stand test (5XSTS) in < 15 seconds indicating an increased LE strength and improved balance. Baseline: 16.57 seconds BUE support at walker upon standing, decreased eccentric control Goal status: INITIAL  2.  Patient will increase Berg Balance score by > 6 points to demonstrate decreased fall risk during functional activities. Baseline: 25/56 Goal status: INITIAL  3.  Patient will increase 3 minute walk test distance to >36ft for progression to community level ambulation,  demonstrating improved gait endurance. Baseline: Completed 2:17 min; 119ft Goal status: INITIAL  4.  Patient will increase 10 meter walk test to >0.6 m/s as to improve gait speed for better community ambulation and to reduce fall risk. Baseline: 0.24 m/s with RW, CGA-minA for posterior/R lateral LOBs Goal status: INITIAL  5.  Patient will reduce timed up and go to <15 seconds to reduce fall risk and demonstrate improved transfer/gait ability. Baseline: 20.77sec Goal status: INITIAL  6.  Patient will improve ABC scale score  by >20% to demonstrate increased confidence with functional mobility and ADLs Baseline: 46.25% Goal status: INITIAL  ASSESSMENT:  CLINICAL IMPRESSION: *** Overall, the patient continues to benefit from skilled physical therapy for movement reeducation, improved motor  control, strength, and functional mobility, with ongoing need for simplified instructions, high cueing demand, and close monitoring to prevent excessive fatigue and exacerbation of knee pain.     OBJECTIVE IMPAIRMENTS: Abnormal gait, decreased activity tolerance, decreased balance, decreased coordination, decreased endurance, decreased knowledge of condition, decreased mobility, difficulty walking, decreased strength, and decreased safety awareness.   ACTIVITY LIMITATIONS: carrying, lifting, stairs, transfers, and locomotion level  PARTICIPATION LIMITATIONS: meal prep, cleaning, laundry, shopping, and community activity  PERSONAL FACTORS: Age, Sex, and 3+ comorbidities: CA, HTN, COPD, DM, depression are also affecting patient's functional outcome.   REHAB POTENTIAL: Fair    CLINICAL DECISION MAKING: Evolving/moderate complexity  EVALUATION COMPLEXITY: Moderate  PLAN:  PT FREQUENCY: 1-2x/week  PT DURATION: 12 weeks  PLANNED INTERVENTIONS: [id number]- PT Re-evaluation, 97750- Physical Performance Testing, 97110-Therapeutic exercises, 97530- Therapeutic activity, 97112- Neuromuscular re-education, 97535- Self Care, [id number]- Manual therapy, [phone]- Gait training, [phone]- Canalith repositioning, Patient/Family education, Balance training, Stair training, Joint mobilization, Vestibular training, Cryotherapy, and Moist heat  PLAN FOR NEXT SESSION:   -Assess stair navigation -Continue to provide education on deficits, promote safety awareness & use of AD -concerned about pt ability to safely come to PT here, but will continue to manage over next few sessions -Monitor knee pain (most limiting per pt with standing exercises) -Standing balance as tolerated    Faith Branan, PT 05/26/2024 8:27 AM   "

## 2024-05-28 ENCOUNTER — Ambulatory Visit: Admitting: Physical Therapy

## 2024-05-28 NOTE — Therapy (Unsigned)
 " OUTPATIENT PHYSICAL THERAPY TREATMENT/ RECERT   Patient Name: Terri [name] MRN: [id number] DOB:Jul 19, 1950, 74 y.o., female Today's Date: 05/28/2024  PCP: Gretel App, NP  REFERRING PROVIDER: Thomasena Sherran BROCKS, PA-C  END OF SESSION:        Past Medical History:  Diagnosis Date   Adenomatous colon polyp    Aortic stenosis    Arthritis    Asthma    Cancer (HCC)    Claudication    COPD (chronic obstructive pulmonary disease) (HCC)    Depression    Diabetes mellitus without complication (HCC)    Esophageal dysphagia    GERD (gastroesophageal reflux disease)    Hyperlipemia    Hypertension    Obesity    Severe obesity (BMI 35.0-35.9 with comorbidity) (HCC)    Past Surgical History:  Procedure Laterality Date   BLADDER SURGERY     COLONOSCOPY WITH PROPOFOL  N/A 08/05/2015   Procedure: COLONOSCOPY WITH PROPOFOL ;  Surgeon: Donnice Vaughn Manes, MD;  Location: [facility] ENDOSCOPY;  Service: Endoscopy;  Laterality: N/A;   ESOPHAGOGASTRODUODENOSCOPY N/A 01/09/2022   Procedure: ESOPHAGOGASTRODUODENOSCOPY (EGD);  Surgeon: Maryruth Ole DASEN, MD;  Location: [facility] ENDOSCOPY;  Service: Endoscopy;  Laterality: N/A;   FOOT SURGERY Left    FRACTURE SURGERY     IR CV LINE INJECTION  06/22/2022   IR IMAGING GUIDED PORT INSERTION  06/05/2022   IR PORT REPAIR CENTRAL VENOUS ACCESS DEVICE  07/02/2022   LOWER EXTREMITY ANGIOGRAPHY Left 02/04/2023   Procedure: Lower Extremity Angiography;  Surgeon: Marea Selinda RAMAN, MD;  Location: [facility] INVASIVE CV LAB;  Service: Cardiovascular;  Laterality: Left;   ORIF ANKLE FRACTURE Left 03/26/2017   Procedure: OPEN REDUCTION INTERNAL FIXATION (ORIF) ANKLE FRACTURE;  Surgeon: Edie Norleen PARAS, MD;  Location: [facility] ORS;  Service: Orthopedics;  Laterality: Left;   Patient Active Problem List   Diagnosis Date Noted   Carotid stenosis 05/15/2024   History of CVA (cerebrovascular accident) 05/07/2024   Stroke (HCC) 04/13/2024   Chronic kidney disease, stage 3a (HCC)  04/13/2024   Depression 04/13/2024   Overweight (BMI 25.0-29.9) 04/13/2024   Fall at home, initial encounter 04/13/2024   Type II diabetes mellitus with renal manifestations (HCC) 04/13/2024   Meningioma (HCC) 04/13/2024   Neuropathy 02/04/2024   Decreased hearing of both ears 02/04/2024   Rash 07/01/2023   Muscle strain of chest wall 07/01/2023   Peripheral vascular disease 05/14/2023   Personal history of fall 04/28/2023   COPD exacerbation (HCC) 03/29/2023   Dyspnea on exertion 02/02/2023   Vitamin D  deficiency 02/02/2023   Vitamin B 12 deficiency 02/02/2023   Diabetic eye exam (HCC) 02/02/2023   Aortic atherosclerosis 02/02/2023   Bradycardia 01/14/2023   Anemia 09/28/2022   Postmenopausal bleeding 01/30/2022   Endometrial cancer (HCC) 01/25/2022   Mild aortic stenosis 10/12/2020   Acute respiratory failure with hypoxia (HCC) 08/05/2015   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   Class 2 severe obesity due to excess calories with serious comorbidity in adult 08/19/2013   Obesity, Class II, BMI 35-39.9, with comorbidity 08/19/2013   Chronic obstructive pulmonary disease, unspecified (HCC) 07/31/2012   Asthma with chronic obstructive pulmonary disease (COPD) (HCC) 07/31/2012   Atherosclerosis of native arteries of extremity with intermittent claudication 03/12/2012   Tobacco use 03/12/2012   Hyperlipidemia associated with type 2 diabetes mellitus (HCC) 06/06/2011   Hypertension associated with diabetes (HCC) 06/06/2011   Mood disorder 06/06/2011   Type 2 diabetes with complication (HCC) 06/06/2011    ONSET DATE: it started when I was a  kid - 2003 for imbalance  REFERRING DIAG:  R26.89 (ICD-10-CM) - Imbalance  G62.9 (ICD-10-CM) - Neuropathy   THERAPY DIAG:  Difficulty in walking, not elsewhere classified  Muscle weakness (generalized)  Unsteadiness on feet  History of falling  Rationale for Evaluation and Treatment: Rehabilitation  SUBJECTIVE:                                                                                                                                                                                              SUBJECTIVE STATEMENT:   ***   PERTINENT HISTORY:   Of note, pt also undergoing treatment for progressive endometrial cancer with cervical and vaginal involvement.   Per neurology note 01/20/24: Patient with PMH significant for hypertension, hyperlipidemia, diabetes, endometrial cancer (currently undergoing treatment). Patient presents for follow-up with ongoing imbalance and weakness. She did not start PT following last office visit given transportation issues. She is no longer taking gabapentin, however she is taking Lyrica 150 mg twice daily per external provider. Patient has previously been diagnosed with chronic severe sensorimotor polyneuropathy as well as advanced lumbar spine degeneration. Again offered referral to physiatry, patient defers at this time Again discussed referral to outpatient physical therapy for gait and balance training, however patient wishes to hold off at this time. Continue Lyrica 150 mg twice daily per external provider.    PAIN:  Are you having pain? No pain at rest, but stops walking/standing several times in session due to Left knee pain (non during STS exercises)   PRECAUTIONS: Fall and Other: pt reporting having port for chemo  WEIGHT BEARING RESTRICTIONS: No  FALLS: Has patient fallen in last 6 months? Pt unsure of when last fall was, estimates to be about last year. Pt notes that she did slip the other day where she knocked her bookcase over; pt reports no injuries.   LIVING ENVIRONMENT: Lives with: lives alone Lives in: House/apartment - town house Stairs: Yes: Internal: ~12 steps; on right going up and External: 3 steps; on right going up Has following equipment at home: Counselling psychologist, Walker - 2 wheeled, shower chair, and Grab bars  PLOF: Independent with basic ADLs  PATIENT GOALS:  walk normally   OBJECTIVE:  Note: Objective measures were completed at Evaluation unless otherwise noted.  DIAGNOSTIC FINDINGS:  Per neurology note 01/20/24: 10/08/2023 EMG IMPRESSION: There is electrodiagnostic evidence of severe sensorimotor polyneuropathy in the lower extremities.  09/06/2023 CT LUMBAR SPINE WO IMPRESSION:  1. No acute osseous abnormality in the Lumbar Spine. Chronic L1  compression fracture, lower thoracic hyperostosis related interbody  ankylosis including at T12-L1.  2. Advanced  lumbar spine degeneration L3-L4 through L5-S1. Moderate  multifactorial spinal stenosis suspected at L3-L4, Mild at L4-L5.  Moderate bilateral L3, moderate to severe bilateral L4, and very  severe bilateral L5 nerve level foraminal stenoses.  3. Aortic Atherosclerosis (ICD10-I70.0).   06/05/2023 CT HEAD AND C SPINE WO CONTRAST IMPRESSION:  1. No acute intracranial pathology. Small-vessel white matter  disease.  2. Soft tissue contusion and hematoma of the right forehead.  3. No fracture or static subluxation of the cervical spine.  4. Moderate multilevel cervical disc degenerative disease.   12/19/2022 CT HEAD AND C SPINE WO CONTRAST IMPRESSION:  1. No acute intracranial abnormality.  2. Right orbital floor fracture with herniation of the inferior  rectus muscle body through the fracture site. Findings can be seen  in the setting of entrapment. Recommend ophthalmologic consultation.  3. Air in the intraconal compartment of the orbit abutting the right  optic nerve. No evidence of an intraorbital hematoma.  4. No acute fracture or traumatic subluxation of the cervical spine.  5. Subcutaneous emphysema that extends inferiorly to the level of  the thyroid  and superiorly to the level of the periorbital soft  tissues. This may be secondary to an additional occult maxillary  sinus injury, possibly along the anterior wall.    COGNITION: Overall cognitive status: pt reporting  difficulty with memory following recent chemo; unable to provide clear timeline of recent falls/stumbles   GAIT: Findings: Gait Characteristics: decreased stride length and narrow BOS, Distance walked: distance required for assessment, Assistive device utilized:Walker - 2 wheeled, and Level of assistance: CGA and Min A  FUNCTIONAL TESTS:  5 times sit to stand: 16.57 with RW for steadying, decreased eccentric control Timed up and go (TUG): to be assessed 3 minute walk test: to be assessed 10 meter walk test:  0.24 with RW, CGA-minA for posterior/R lateral LOBs   PATIENT SURVEYS:  ABC scale 11/5: 46%                                                                                                                             TREATMENT DATE 05/28/24:  Physical therapy treatment session today consisted of completing assessment of goals and administration of testing as demonstrated and documented in flow sheet, treatment, and goals section of this note. Addition treatments may be found below.   ABC: PT instructed pt in TUG: *** sec (average of 3 trials; >13.5 sec indicates increased fall risk) 10 Meter Walk Test: Patient instructed to walk 10 meters (32.8 ft) as quickly and as safely as possible at their normal speed x2 and at a fast speed x2. Time measured from 2 meter mark to 8 meter mark to accommodate ramp-up and ramp-down.  Normal speed 1: *** m/s Normal speed 2: *** m/s Average Normal speed: *** m/s Fast speed 1: *** m/s Fast speed 2: *** m/s Average Fast speed: *** m/s Cut off scores: <0.4 m/s = household Ambulator, 0.4-0.8 m/s = limited community  Ambulator, >0.8 m/s = community Ambulator, >1.2 m/s = crossing a street, <1.0 = increased fall risk MCID 0.05 m/s (small), 0.13 m/s (moderate), 0.06 m/s (significant)  (ANPTA Core Set of Outcome Measures for Adults with Neurologic Conditions, 2018)  3 min walk test:  BERG:  Pt performed 5 time sit<>stand (5xSTS): *** sec (>15 sec  indicates increased fall risk)   TA- To improve functional movements patterns for everyday tasks    Transfer with CGA to nustep from transport chair  Nustel level 1-4 double hill interval training x 8 min - cues to continue pace- pt transitions from UE to no UE to UE again as her arms and legs fatigued respectively. Encouraged this was intent of activity   Gait to chair - CGA, with RW ~34ft   Attempted march walk but despite verbal and visual ues pt cannot complete with proper form ( she just walks) x 4 laps in / bars with 3# AW   Standing march with 3# AW - 2 x 10   Standing hip ABD 2 x 10 with 3# AW   TE- To improve strength, endurance, mobility, and function of specific targeted muscle groups or improve joint range of motion or improve muscle flexibility - with all activities pt requires external cues for proper completion   Seated LAQ 3 x 10 with 3#AW  Seated HS curl x 10 ea RTB - heavy cues and eventually only performs through 1/3 of proper ROM despite cues    NMR: To facilitate reeducation of movement, balance, posture, coordination, and/or proprioception/kinesthetic sense.   Gait over 1/2 foam rollers for increased attention to feet with object navigation- UE assist allowed. X 4 laps    Unless otherwise stated, CGA was provided and gait belt donned in order to ensure pt safety   Pt required occasional rest breaks due fatigue, PT was attentive to when pt appeared to be tired or winded in order to prevent excessive fatigue. Pt also has knee pain that is easily exacerbated so alternated between standing and seated exercises when possible.   PATIENT EDUCATION: Education details: Heavy cues throughout, instruction that we will avoid knee pain and exacerbations as much as we are able.  Person educated: Patient Education method: Explanation and Verbal cues Education comprehension: verbalized understanding and needs further education  HOME EXERCISE PROGRAM: Access Code:  [email or web address] URL: [email or web address] Date: 03/06/2024 Prepared by: Lonni Gainer  Exercises - Sit to stand with counter or walker support in front of you  - 1 x daily - 7 x weekly - 2 sets - 6 reps - Seated March  - 1 x daily - 7 x weekly - 2 sets - 10 reps - Seated Long Arc Quad  - 1 x daily - 7 x weekly - 2 sets - 10 reps  GOALS: Goals reviewed with patient? Yes  SHORT TERM GOALS: Target date: 04/15/24  Patient will be independent with home exercise program to improve strength/mobility for increased functional independence with ADLs and mobility. Baseline: to be assessed Goal status: INITIAL   LONG TERM GOALS: Target date: 05/27/24  Patient (> 16 years old) will complete five times sit to stand test (5XSTS) in < 15 seconds indicating an increased LE strength and improved balance. Baseline: 16.57 seconds BUE support at walker upon standing, decreased eccentric control Goal status: INITIAL  2.  Patient will increase Berg Balance score by > 6 points to demonstrate decreased fall risk during functional activities. Baseline: 25/56 Goal status: INITIAL  3.  Patient  will increase 3 minute walk test distance to >370ft for progression to community level ambulation, demonstrating improved gait endurance. Baseline: Completed 2:17 min; 186ft Goal status: INITIAL  4.  Patient will increase 10 meter walk test to >0.6 m/s as to improve gait speed for better community ambulation and to reduce fall risk. Baseline: 0.24 m/s with RW, CGA-minA for posterior/R lateral LOBs Goal status: INITIAL  5.  Patient will reduce timed up and go to <15 seconds to reduce fall risk and demonstrate improved transfer/gait ability. Baseline: 20.77sec Goal status: INITIAL  6.  Patient will improve ABC scale score  by >20% to demonstrate increased confidence with functional mobility and ADLs Baseline: 46.25% Goal status: INITIAL  ASSESSMENT:  CLINICAL IMPRESSION: *** Overall, the patient  continues to benefit from skilled physical therapy for movement reeducation, improved motor control, strength, and functional mobility, with ongoing need for simplified instructions, high cueing demand, and close monitoring to prevent excessive fatigue and exacerbation of knee pain.     OBJECTIVE IMPAIRMENTS: Abnormal gait, decreased activity tolerance, decreased balance, decreased coordination, decreased endurance, decreased knowledge of condition, decreased mobility, difficulty walking, decreased strength, and decreased safety awareness.   ACTIVITY LIMITATIONS: carrying, lifting, stairs, transfers, and locomotion level  PARTICIPATION LIMITATIONS: meal prep, cleaning, laundry, shopping, and community activity  PERSONAL FACTORS: Age, Sex, and 3+ comorbidities: CA, HTN, COPD, DM, depression are also affecting patient's functional outcome.   REHAB POTENTIAL: Fair    CLINICAL DECISION MAKING: Evolving/moderate complexity  EVALUATION COMPLEXITY: Moderate  PLAN:  PT FREQUENCY: 1-2x/week  PT DURATION: 12 weeks  PLANNED INTERVENTIONS: [id number]- PT Re-evaluation, 97750- Physical Performance Testing, 97110-Therapeutic exercises, 97530- Therapeutic activity, 97112- Neuromuscular re-education, 97535- Self Care, [id number]- Manual therapy, [phone]- Gait training, [phone]- Canalith repositioning, Patient/Family education, Balance training, Stair training, Joint mobilization, Vestibular training, Cryotherapy, and Moist heat  PLAN FOR NEXT SESSION:   -Assess stair navigation -Continue to provide education on deficits, promote safety awareness & use of AD -concerned about pt ability to safely come to PT here, but will continue to manage over next few sessions -Monitor knee pain (most limiting per pt with standing exercises) -Standing balance as tolerated    Lonni KATHEE Gainer, PT 05/28/2024 7:51 AM   "

## 2024-06-02 ENCOUNTER — Ambulatory Visit: Admitting: Physical Therapy

## 2024-06-03 ENCOUNTER — Inpatient Hospital Stay: Attending: Obstetrics and Gynecology | Admitting: Obstetrics and Gynecology

## 2024-06-03 ENCOUNTER — Inpatient Hospital Stay

## 2024-06-03 ENCOUNTER — Encounter: Payer: Self-pay | Admitting: Obstetrics and Gynecology

## 2024-06-03 VITALS — BP 171/73 | HR 77 | Temp 98.6°F | Resp 20 | Wt 158.0 lb

## 2024-06-03 DIAGNOSIS — C541 Malignant neoplasm of endometrium: Secondary | ICD-10-CM

## 2024-06-03 NOTE — Progress Notes (Signed)
 Gynecologic Oncology Consult Visit   Referring Provider: Dr. 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 female who is seen in consultation from Dr. Leonce for locally advanced endometrial cancer, currently on weekly taxol  since March 2025 who had recent PET scan and returns to clinic for pelvic exam.    Vaginal biopsy 10/25 1. POORLY DIFFERENTIATED CARCINOMA CONSISTENT WITH INVOLVEMENT BY THE PREVIOUSLY DIAGNOSED ENDOMETRIAL CARCINOMA.  Treated with Megace  40 mg bid. Still some light spotting.    12/25 was admitted with CVA. Multiple falls, poor balance, difficulty speaking, right hand weakness, and abnormal MRI of brain with stroke. Her brain MRI showed acute/subacute right MCA stroke.  CT angiogram showed right carotid bulb with estimated 50-60% stenosis.  Due to recurrent right MCA stroke, stenosis may be significant.  As a result, carotid ultrasound was obtained.  This has been reviewed by Dr. Franz, agree that patient has 50 to 60% stenosis.  The treatment plan is continue aspirin , and Plavix  for 21 days, continue Crestor.  Patient will follow-up with Dr. Franz in the office to consider vascular intervention.   Has noticed changes in her hearing and worsening peripheral neuropathy.  Barely able to walk.   In interim she has had a PET scan which showed persistent hypermetabolic activity in uterus, slightly increased.  No metastatic disease. .   Treatment Summary:  03/13/22- 04/18/22- pelvic radiation for extensive vaginal involvement of malignancy- 50 Gy 05/15/22- CT C/A/P consistent with residual endometrial primary, borderline common iliac nodes similar to pet and not hypermetabolic. Scattered pulmonary nodules.  06/20/22- Cycle 1 Carbo-Taxol -Keytruda  07/11/22- Cycle 2 Carbo-taxol -keytruda  08/01/22- Cycle 3 Carbo-taxol -keytruda  08/14/22- PET - consistent with response to therapy 08/22/22-  Cycle 4 Carbo-taxol -keytruda  09/12/22- Cycle 5 carbo-taxol -keytruda  10/03/22- Cycle 6 carbo-taxol -keytruda  10/12/22- CT C/A/P - unchanged appearance. No worsening disease. Radiation effects.  10/23/22- maintenance keytruda   11/14/22- keytruda  12/05/22- keytruda  12/26/22- keytruda  01/15/23- PET concerning for progressive disease in lower uterine segment and iliac nodes 01/16/23- AGUS favoring cancer Pap 02/06/23- Keytruda  02/27/23- Keytruda ; Foundation One Sent. Second line doxil  discussed. Felt not to be a surgical candidate.  02/27/23- Cervix, biopsy, - endometrial carcinoma 04/10/23- D1C1 - Doxil  50 mg/m2 05/08/23- D1C2 Doxil  50 mg/m2 06/05/23- D1C3 Doxil  50 mg/m2 06/2023- vaginal bleeding 07/11/23- started weekly paclitaxel  80 mg/m2 01/01/24- completed 6 cycles of weekly paclitaxel    Gyn Oncology History She was referred by Dr. Auston to Dr. Leonce for reports of postmenopausal bleeding for 6 months, may be more.  Additionally has urinary incontinence.   On exam, friable cervical vs vaginal mass was partially visualized though exam was limited. Biopsy was performed.  Cervical Biopsy: poorly differentiated carcinoma, favor endometrial origin. Positive for p53 and ER. Focal staining for p16, negative for napsin and p63. HER2- 1+ negative. MSI/MMR- Stable Colonoscopy was scheduled but prep inadequate.    PET/CT EXAM: 10/23 Marked diffuse hypermetabolic activity throughout the entire uterus and cervix, consistent with malignancy. This favors endometrial carcinoma over cervical carcinoma. Borderline enlarged bilateral common iliac lymph nodes noted, but without FDG uptake. No definite evidence of metastatic disease by PET.  Patient seen 01/24/22 and found to have extensive cervical and upper vaginal involvement.  Only having light bleeding.  Decision made to treat her with primary radiation therapy because of extensive involvement of cervix and adjacent vagina, and she was reluctant to consider  chemotherapy.  She received primary pelvic external radiation 50 Gy, completed 04/18/22.   05/15/22-  CT Chest abdomen Pelvis w contrast IMPRESSION: 1. Central uterine hypoattenuation likely represents residual endometrial primary. 2. Borderline sized common iliac nodes are similar to the prior PET and were not hypermetabolic on that exam. Favored to be reactive. Otherwise, no evidence of metastatic disease in the abdomen or pelvis. 3. Scattered tiny pulmonary nodules are subpleural predominant and favored to represent subpleural lymph nodes. These can be re-evaluated at follow-up.  Treated with Carboplatin /Taxol  and Keytruda  x 3 cycles with excellent clinical response clinically.   CT/PET 4/24 shows response in the uterus. IMPRESSION: 1. Interval decrease in radiotracer uptake associated with the uterus compatible with response to therapy. SUV max is currently equal to 4.22, compared with 21.8 previously. 2. No significant change in size of bilateral common iliac lymph nodes with mild, nonspecific FDG uptake. 3. No new sites of disease identified. 4. Diffusely increased bone marrow activity is new compared with the previous exam and likely reflects treatment related changes.  PAP 01/16/23 AGUS favor cancer  PET scan  01/15/23  - IMPRESSION: Mild residual hypermetabolism in the lower uterine segment, nonspecific. Residual/viable tumor is not excluded.   - Small bilateral common iliac nodes, with associated mild hypermetabolism, suggesting small nodal metastases.  - Healing fractures of the right anterior 3rd and 4th ribs, posttraumatic.  02/27/23- Cervix, biopsy  INVOLVEMENT BY PATIENT'S KNOWN ENDOMETRIAL CARCINOMA.  She was felt to not be a good candidate for surgery due to extent of vaginal recurrence and PET positive common iliac nodes in addition to radiation effect in the vagina potentially complicating healing. Additionally, she has significant medical comorbidities and had been falling.  We discussed options including doxil  vs clinical trial with TROP2 ADC however she declined trial d/t concerns with transportation.   Foundation testing was sent - 03/06/23- HRDsig Negative, MS stable, TMB 0, PTEN loss exons 4-9, PPP2R1A S256F, TP53 R282W  She was admitted to hospital in late November 2024 for hypoxia secondary to COPD exacerbation. She was treated with steroids and antibiotics.  04/10/23- started Doxil  chemotherapy She received 3 cycles of Doxil .   She began having vaginal bleeding x 1 month and declined evaluation. She suffered a fall 06/2023 while leaning forward from chair and suffered contusions, laceration, and hematoma to face.  Feb 2025- vaginal bleeding/spotting x 1 month  06/14/23- PET  IMPRESSION: 1. Significant and progressive hypermetabolism in the endometrium consistent with recurrent/progressive endometrial cancer. 2. Enlarging right common iliac node with increasing hypermetabolism consistent with metastatic disease. Other small common iliac and external iliac lymph nodes are stable. 3. No findings for other sites of metastatic disease. 4. New scattered tree-in-bud type nodularity suggesting chronic inflammation or atypical infection such as MAC. This is most notable in the right lung.  PET scan 10/15/23 FINDINGS: ABDOMEN/PELVIS: Metabolic activity again demonstrated centrally within the uterus with SUV max 11.8 compared SUV max equal 16.5 4A reduction activity.   No radiotracer avid pelvic lymph nodes. No retroperitoneal nodes. No liver metastasis.   Resolution of previously hypermetabolic RIGHT common iliac lymph node.   Incidental CT findings: None.   SKELETON: Metabolic activity associated with a healed RIGHT rib fracture on image 56. favor benign posttraumatic findings. Multiple additional healed rib fractures on the RIGHT. Potential re-injury of this rib   Incidental CT findings: None.   IMPRESSION: 1. Decreased metabolic activity within the  endometrium. 2. Resolution RIGHT common iliac lymph node metabolic activity. 3. No hypermetabolic pelvic or abdominal lymph nodes 4. No distant metastatic disease.  Genetic Testing  Tumor Testing:  FoundationOne CDx: 03/06/23- HRDsig Negative, MS stable, TMB 0, PTEN loss exons 4-9, PPP2R1A S256F, TP53 R282W  Testing on 12/28/2021 specimen P53 positive, ER positive  Problem List: Patient Active Problem List   Diagnosis Date Noted   Carotid stenosis 05/15/2024   History of CVA (cerebrovascular accident) 05/07/2024   Stroke (HCC) 04/13/2024   Chronic kidney disease, stage 3a (HCC) 04/13/2024   Depression 04/13/2024   Overweight (BMI 25.0-29.9) 04/13/2024   Fall at home, initial encounter 04/13/2024   Type II diabetes mellitus with renal manifestations (HCC) 04/13/2024   Meningioma (HCC) 04/13/2024   Neuropathy 02/04/2024   Decreased hearing of both ears 02/04/2024   Rash 07/01/2023   Muscle strain of chest wall 07/01/2023   Peripheral vascular disease 05/14/2023   Personal history of fall 04/28/2023   COPD exacerbation (HCC) 03/29/2023   Dyspnea on exertion 02/02/2023   Vitamin D  deficiency 02/02/2023   Vitamin B 12 deficiency 02/02/2023   Diabetic eye exam (HCC) 02/02/2023   Aortic atherosclerosis 02/02/2023   Bradycardia 01/14/2023   Anemia 09/28/2022   Postmenopausal bleeding 01/30/2022   Endometrial cancer (HCC) 01/25/2022   Mild aortic stenosis 10/12/2020   Acute respiratory failure with hypoxia (HCC) 08/05/2015   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   Class 2 severe obesity due to excess calories with serious comorbidity in adult 08/19/2013   Obesity, Class II, BMI 35-39.9, with comorbidity 08/19/2013   Chronic obstructive pulmonary disease, unspecified (HCC) 07/31/2012   Asthma with chronic obstructive pulmonary disease (COPD) (HCC) 07/31/2012   Atherosclerosis of native arteries of extremity with intermittent claudication 03/12/2012   Tobacco use 03/12/2012   Hyperlipidemia  associated with type 2 diabetes mellitus (HCC) 06/06/2011   Hypertension associated with diabetes (HCC) 06/06/2011   Mood disorder 06/06/2011   Type 2 diabetes with complication (HCC) 06/06/2011    Past Medical History: Past Medical History:  Diagnosis Date   Adenomatous colon polyp    Aortic stenosis    Arthritis    Asthma    Cancer (HCC)    Claudication    COPD (chronic obstructive pulmonary disease) (HCC)    Depression    Diabetes mellitus without complication (HCC)    Esophageal dysphagia    GERD (gastroesophageal reflux disease)    Hyperlipemia    Hypertension    Obesity    Severe obesity (BMI 35.0-35.9 with comorbidity) (HCC)     Past Surgical History: Past Surgical History:  Procedure Laterality Date   BLADDER SURGERY     COLONOSCOPY WITH PROPOFOL  N/A 08/05/2015   Procedure: COLONOSCOPY WITH PROPOFOL ;  Surgeon: Donnice Vaughn Manes, MD;  Location: [facility] ENDOSCOPY;  Service: Endoscopy;  Laterality: N/A;   ESOPHAGOGASTRODUODENOSCOPY N/A 01/09/2022   Procedure: ESOPHAGOGASTRODUODENOSCOPY (EGD);  Surgeon: Maryruth Ole DASEN, MD;  Location: [facility] ENDOSCOPY;  Service: Endoscopy;  Laterality: N/A;   FOOT SURGERY Left    FRACTURE SURGERY     IR CV LINE INJECTION  06/22/2022   IR IMAGING GUIDED PORT INSERTION  06/05/2022   IR PORT REPAIR CENTRAL VENOUS ACCESS DEVICE  07/02/2022   LOWER EXTREMITY ANGIOGRAPHY Left 02/04/2023   Procedure: Lower Extremity Angiography;  Surgeon: Marea Selinda RAMAN, MD;  Location: [facility] INVASIVE CV LAB;  Service: Cardiovascular;  Laterality: Left;   ORIF ANKLE FRACTURE Left 03/26/2017   Procedure: OPEN REDUCTION INTERNAL FIXATION (ORIF) ANKLE FRACTURE;  Surgeon: Edie Norleen PARAS, MD;  Location: [facility] ORS;  Service: Orthopedics;  Laterality: Left;    OB History:  G17P0 OB History  No obstetric history on file.  Family History: Family History  Problem Relation Age of Onset   CVA Mother    Diabetes Mother    CAD Father    Breast cancer Neg Hx      Social History: Social History   Socioeconomic History   Marital status: Single    Spouse name: Not on file   Number of children: Not on file   Years of education: Not on file   Highest education level: Not on file  Occupational History   Not on file  Tobacco Use   Smoking status: Former    Current packs/day: 1.00    Average packs/day: 1 pack/day for 40.0 years (40.0 ttl pk-yrs)    Types: Cigarettes   Smokeless tobacco: Never   Tobacco comments:    Quite 2015  Vaping Use   Vaping status: Never Used  Substance and Sexual Activity   Alcohol use: No   Drug use: No   Sexual activity: Not on file  Other Topics Concern   Not on file  Social History Narrative   Lives alone.  Alyse Clarity, here with her today.  Indoor cats.     Social Drivers of Health   Tobacco Use: Medium Risk (05/15/2024)   Patient History    Smoking Tobacco Use: Former    Smokeless Tobacco Use: Never    Passive Exposure: Not on file  Financial Resource Strain: Low Risk (04/01/2024)   Overall Financial Resource Strain (CARDIA)    Difficulty of Paying Living Expenses: Not hard at all  Food Insecurity: No Food Insecurity (04/13/2024)   Epic    Worried About Programme Researcher, Broadcasting/film/video in the Last Year: Never true    Ran Out of Food in the Last Year: Never true  Transportation Needs: No Transportation Needs (04/13/2024)   Epic    Lack of Transportation (Medical): No    Lack of Transportation (Non-Medical): No  Physical Activity: Inactive (04/01/2024)   Exercise Vital Sign    Days of Exercise per Week: 0 days    Minutes of Exercise per Session: 0 min  Stress: No Stress Concern Present (04/01/2024)   [employer] of Occupational Health - Occupational Stress Questionnaire    Feeling of Stress: Not at all  Social Connections: Socially Isolated (04/13/2024)   Social Connection and Isolation Panel    Frequency of Communication with Friends and Family: Twice a week    Frequency of Social Gatherings with  Friends and Family: Twice a week    Attends Religious Services: Never    Database Administrator or Organizations: No    Attends Banker Meetings: Never    Marital Status: Divorced  Catering Manager Violence: Not At Risk (04/13/2024)   Epic    Fear of Current or Ex-Partner: No    Emotionally Abused: No    Physically Abused: No    Sexually Abused: No  Depression (PHQ2-9): Low Risk (04/01/2024)   Depression (PHQ2-9)    PHQ-2 Score: 0  Alcohol Screen: Low Risk (09/20/2022)   Alcohol Screen    Last Alcohol Screening Score (AUDIT): 0  Housing: Low Risk (04/13/2024)   Epic    Unable to Pay for Housing in the Last Year: No    Number of Times Moved in the Last Year: 0    Homeless in the Last Year: No  Utilities: Not At Risk (04/13/2024)   Epic    Threatened with loss of utilities: No  Health Literacy: Adequate Health Literacy (04/01/2024)   B1300 Health Literacy  Frequency of need for help with medical instructions: Never    Allergies: No Known Allergies  Current Medications: Current Outpatient Medications  Medication Sig Dispense Refill   albuterol  (VENTOLIN  HFA) 108 (90 Base) MCG/ACT inhaler Inhale 2 puffs into the lungs every 6 (six) hours as needed for wheezing or shortness of breath. 1 each 1   amLODipine  (NORVASC ) 10 MG tablet Take 1 tablet (10 mg total) by mouth daily. 90 tablet 3   aspirin  EC 81 MG tablet Take 81 mg by mouth daily. Swallow whole.     atorvastatin  (LIPITOR) 80 MG tablet Take 1 tablet (80 mg total) by mouth daily. 90 tablet 3   Blood Glucose Monitoring Suppl DEVI Use to check blood glucose as instructed. May substitute to any manufacturer covered by patient's 2026 insurance (Accuchek or Contour meters =$0). E11.65. Z79.4 1 each 0   budesonide -glycopyrrolate -formoterol  (BREZTRI ) 160-9-4.8 MCG/ACT AERO inhaler Inhale 2 puffs into the lungs 2 (two) times daily. Via PAP [place]&Me     buPROPion  (WELLBUTRIN  XL) 300 MG 24 hr tablet Take 1 tablet (300 mg total)  by mouth daily. 90 tablet 0   Cholecalciferol  (VITAMIN D3) 50 MCG (2000 UT) capsule Take 2,000 Units by mouth daily.     citalopram  (CELEXA ) 20 MG tablet Take 1 tablet (20 mg total) by mouth daily. 90 tablet 3   cloNIDine  (CATAPRES ) 0.2 MG tablet Take 1 tablet (0.2 mg total) by mouth 2 (two) times daily. 180 tablet 3   cyanocobalamin  (VITAMIN B12) 1000 MCG tablet Take 1 tablet (1,000 mcg total) by mouth daily. 90 tablet 3   glucose blood test strip Use as instructed to test blood sugar 4 times daily. 100 each 12   insulin  aspart (NOVOLOG ) 100 UNIT/ML injection Inject 8 Units into the skin 3 (three) times daily before meals.     insulin  degludec (TRESIBA  FLEXTOUCH) 100 UNIT/ML FlexTouch Pen Inject 20 Units into the skin daily. 20 units once daily - REPLACES Levemir      Lancets MISC Use to check blood sugar up to four times daily as directed. E11.65, Z79.4. (2026 insurance prefers Contour or Accuchek =$0) 100 each 11   losartan -hydrochlorothiazide  (HYZAAR) 100-25 MG tablet Take 1 tablet by mouth daily. 90 tablet 3   megestrol  (MEGACE ) 40 MG tablet Take 2 tablets (80 mg total) by mouth 2 (two) times daily. 120 tablet 1   metoprolol  succinate (TOPROL -XL) 50 MG 24 hr tablet Take 1 tablet (50 mg total) by mouth daily. 90 tablet 0   montelukast  (SINGULAIR ) 10 MG tablet Take 10 mg by mouth at bedtime.     ondansetron  (ZOFRAN ) 8 MG tablet Take 1 tablet (8 mg total) by mouth every 8 (eight) hours as needed for nausea or vomiting. 30 tablet 0   No current facility-administered medications for this visit.    Review of Systems General:  fatigue Skin: no complaints Eyes: no complaints HEENT: decreased hearing Breasts: no complaints Pulmonary: no complaints Cardiac: no complaints Gastrointestinal: decreased appetite and constipation; no complaints Ob/Gyn: ongoing vaginal spotting/bleeding Musculoskeletal: falls Hematology: no complaints Neurologic/Psych: depression, Peripheral neuropathy  Objective:   Physical Examination:  BP (!) 171/73   Pulse 77   Temp 98.6 F (37 C)   Resp 20   Wt 158 lb (71.7 kg)   SpO2 100%   BMI 28.90 kg/m     ECOG Performance Status: 3  GENERAL: Patient is a elderly appearing female in no acute distress. In wheelchair.  HEENT:  Sclera clear. Anicteric NODES:  Negative axillary,  supraclavicular, inguinal lymph node survery LUNGS:  Normal respiratory rate ABDOMEN:  Soft, nontender.  No hernias, incisions well healed. No masses or ascites EXTREMITIES:  No peripheral edema. Atraumatic. No cyanosis SKIN:  no rash or suspicious lesions identified.  NEURO:  Nonfocal. Well oriented.  Appropriate affect.  Pelvic exam: Chaperoned by NP EGBUS: no lesions Cervix/Vagina: narrow with some watery blood tinged fluid.  No irregular tissue seen.    BME: normal    Lab Review No labs on site today  Radiology Review: I have independently reviewed imaging results as below and agree with findings and described in assessment & plan.   01/31/24- PET restaging FINDINGS:  NECK:  - No abnormal hypermetabolism.   CHEST: - No abnormal hypermetabolism. Incidental CT findings: - Right IJ Port-A-Cath terminates in the right atrium. Atherosclerotic calcification of the aorta, aortic valve and coronary arteries. Heart is enlarged. No pericardial or pleural effusion.   ABDOMEN/PELVIS: - Endometrial hypermetabolism has enlarged in scope and intensity, now 13.1, previously 11.8. Measurement on CT is challenging without IV contrast. No additional abnormal hypermetabolism. Incidental CT findings: - Slight thickening of the right adrenal gland, unchanged. No specific follow-up necessary. Small hiatal hernia.   SKELETON: - Hypermetabolism associated with healing right L1, L2 L3 and L4 transverse process fractures. Hypermetabolism associated with a subacute appearing fracture of the posteromedial eighth rib. Otherwise, no abnormal hypermetabolism. Incidental CT findings: -  Degenerative changes in the spine.   IMPRESSION: 1. Endometrial hypermetabolism has increased slightly in scope and intensity, indicative of disease progression. No evidence of metastatic disease. 2. Healing posteromedial right eighth rib and right L1-L4 transverse process fractures. 3. Aortic atherosclerosis (ICD10-I70.0). Coronary artery calcification.     Electronically Signed   By: Newell Eke M.D.   On: 02/03/2024 11:07     Assessment:  Terri [name] is a 74 y.o. 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  PET scan shows involvement of entire uterus and cervix.  No distant disease. Bilateral common iliac nodes are borderline enlarged, but not PET positive.   Poorly differentiated cancer with TP53 overexpression and negative HPV test so unlikely to be cervical primary. She was hesitant for chemotherapy and elected for radiation in view of her locally advanced disease.  Completed 50 Gy pelvic radiation 04/18/22 and on exam still had fleshy cancer involving upper vagina and flush cervix.  CT scan continued to show hypoattenuation of uterus cw persistent disease, but no evidence of distant disease.  Discussed that she was not a surgical candidate due to extensive upper vaginal involvement with cancer and decision made to start systemic therapy.  She received three cycles of carboplatin /taxol /keytruda  and PET/CT 4/24 showed considerable decrease SUV in the uterus and no distant disease.  On exam 08/15/22 the vaginal disease had regressed dramatically. Completed 6 cycles of carboplain/taxol /keytruda  10/04/22.  CT scan showed no evidence of residual disease. Continued on maintenance Keytruda .    Some vaginal spotting 9/24, but no obvious evidence of disease on exam.  PET scan showed mild activity in lower uterine segment and two high common iliac nodes 7-8 mm. PAP AGUS concerning for cancer. Doxil  x 3  cycles with progressive disease. Weekly taxol  with 3 cycles followed by PET showed decreased metabolic activity within the endometrium and resolution RIGHT common iliac lymph node metabolic activity. Weekly taxol  was continued. She completed 6 cycles of weekly taxol . PET scan on 01/31/24 was independently reviewed and I agree with  persistent hypermetabolic activity of the uterine fundus. She has ongoing vaginal bleeding and irregular tissue at the apex that is suspicious for persistent diease. A piece of that tissue fell off during exam today and pathology showed recurrence. She initiated treatment with Megace .  Still has some spotting.  She had a CVA in 12/25 and has recovered. Has carotid stenosis.  Cancer is TP53 mutated, MSS and HER2 negative. ER positive  Medical co-morbidities complicating care: obesity, COPD, aortic stenosis, PVD.  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 I suspect that her neuropathy will limit further treatment. She has become more frail, suffered additional falls. Lives alone and uses [facility] transport.   Will continue Megace  40 mg bid in view of favorable vaginal exam. This is associated with VTE, not really arterial thrombosis and she does have known carotid stenosis Only on baby ASA and bleeding is very light.   Discuss care plan with Dr Jacobo and he is in agreement.  RTC 3 months.   Prentice Agent, MD  CC:  Hope Merle, MD [address] [place],  [place] [address] [phone]

## 2024-06-04 ENCOUNTER — Ambulatory Visit: Admitting: Physical Therapy

## 2024-06-09 ENCOUNTER — Ambulatory Visit: Attending: Student | Admitting: Physical Therapy

## 2024-06-11 ENCOUNTER — Ambulatory Visit: Admitting: Physical Therapy

## 2024-06-16 ENCOUNTER — Ambulatory Visit: Admitting: Physical Therapy

## 2024-06-18 ENCOUNTER — Ambulatory Visit: Admitting: Physical Therapy

## 2024-06-19 ENCOUNTER — Inpatient Hospital Stay

## 2024-06-19 ENCOUNTER — Inpatient Hospital Stay: Admitting: Oncology

## 2024-06-23 ENCOUNTER — Ambulatory Visit: Admitting: Physical Therapy

## 2024-06-25 ENCOUNTER — Ambulatory Visit: Admitting: Physical Therapy

## 2024-06-30 ENCOUNTER — Ambulatory Visit: Attending: Student | Admitting: Physical Therapy

## 2024-07-02 ENCOUNTER — Ambulatory Visit: Admitting: Physical Therapy

## 2024-07-07 ENCOUNTER — Ambulatory Visit: Admitting: Physical Therapy

## 2024-07-09 ENCOUNTER — Ambulatory Visit: Admitting: Physical Therapy

## 2024-08-07 ENCOUNTER — Ambulatory Visit (INDEPENDENT_AMBULATORY_CARE_PROVIDER_SITE_OTHER): Admitting: Vascular Surgery

## 2024-08-07 ENCOUNTER — Encounter (INDEPENDENT_AMBULATORY_CARE_PROVIDER_SITE_OTHER)

## 2024-08-14 ENCOUNTER — Encounter (INDEPENDENT_AMBULATORY_CARE_PROVIDER_SITE_OTHER)

## 2024-08-14 ENCOUNTER — Ambulatory Visit (INDEPENDENT_AMBULATORY_CARE_PROVIDER_SITE_OTHER): Admitting: Vascular Surgery

## 2024-09-02 ENCOUNTER — Inpatient Hospital Stay

## 2025-04-12 ENCOUNTER — Ambulatory Visit
